# Patient Record
Sex: Male | Born: 1935 | Race: White | Hispanic: No | Marital: Married | Smoking: Former smoker
Health system: Southern US, Community
[De-identification: ages and names within clinical notes are randomized; demographics above are authoritative.]

## PROBLEM LIST (undated history)

## (undated) DIAGNOSIS — Z85828 Personal history of other malignant neoplasm of skin: Secondary | ICD-10-CM

## (undated) DIAGNOSIS — M25562 Pain in left knee: Secondary | ICD-10-CM

## (undated) DIAGNOSIS — G459 Transient cerebral ischemic attack, unspecified: Secondary | ICD-10-CM

## (undated) DIAGNOSIS — M792 Neuralgia and neuritis, unspecified: Secondary | ICD-10-CM

## (undated) DIAGNOSIS — N189 Chronic kidney disease, unspecified: Secondary | ICD-10-CM

## (undated) DIAGNOSIS — N400 Enlarged prostate without lower urinary tract symptoms: Secondary | ICD-10-CM

## (undated) DIAGNOSIS — R1310 Dysphagia, unspecified: Secondary | ICD-10-CM

## (undated) DIAGNOSIS — I609 Nontraumatic subarachnoid hemorrhage, unspecified: Secondary | ICD-10-CM

## (undated) DIAGNOSIS — M542 Cervicalgia: Secondary | ICD-10-CM

## (undated) DIAGNOSIS — I10 Essential (primary) hypertension: Secondary | ICD-10-CM

## (undated) DIAGNOSIS — E785 Hyperlipidemia, unspecified: Secondary | ICD-10-CM

## (undated) DIAGNOSIS — Z86010 Personal history of colonic polyps: Secondary | ICD-10-CM

## (undated) DIAGNOSIS — I739 Peripheral vascular disease, unspecified: Secondary | ICD-10-CM

## (undated) DIAGNOSIS — S40012A Contusion of left shoulder, initial encounter: Secondary | ICD-10-CM

## (undated) DIAGNOSIS — I639 Cerebral infarction, unspecified: Secondary | ICD-10-CM

## (undated) DIAGNOSIS — E119 Type 2 diabetes mellitus without complications: Secondary | ICD-10-CM

## (undated) HISTORY — DX: Peripheral vascular disease, unspecified: I73.9

## (undated) HISTORY — DX: Chronic kidney disease, unspecified: N18.9

## (undated) HISTORY — DX: Benign prostatic hyperplasia without lower urinary tract symptoms: N40.0

## (undated) HISTORY — DX: Contusion of left shoulder, initial encounter: S40.012A

## (undated) HISTORY — DX: Nontraumatic subarachnoid hemorrhage, unspecified: I60.9

## (undated) HISTORY — DX: Dysphagia, unspecified: R13.10

## (undated) HISTORY — DX: Hyperlipidemia, unspecified: E78.5

## (undated) HISTORY — DX: Personal history of other malignant neoplasm of skin: Z85.828

## (undated) HISTORY — DX: Transient cerebral ischemic attack, unspecified: G45.9

## (undated) HISTORY — DX: Essential (primary) hypertension: I10

## (undated) HISTORY — DX: Type 2 diabetes mellitus without complications: E11.9

## (undated) HISTORY — DX: Personal history of colonic polyps: Z86.010

## (undated) HISTORY — DX: Cerebral infarction, unspecified: I63.9

## (undated) HISTORY — DX: Pain in left knee: M25.562

## (undated) HISTORY — PX: TONSILLECTOMY: SUR1361

## (undated) HISTORY — DX: Cervicalgia: M54.2

---
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
## 1990-06-26 HISTORY — PX: COLONOSCOPY: SHX174

## 1996-06-26 HISTORY — PX: OTHER SURGICAL HISTORY: SHX169

## 1996-06-26 HISTORY — PX: COLONOSCOPY: SHX174

## 2003-10-14 DIAGNOSIS — Z860100 Personal history of colon polyps, unspecified: Secondary | ICD-10-CM

## 2003-10-14 HISTORY — DX: Personal history of colonic polyps: Z86.010

## 2003-10-14 HISTORY — DX: Personal history of colon polyps, unspecified: Z86.0100

## 2005-11-06 DIAGNOSIS — I6389 Other cerebral infarction: Secondary | ICD-10-CM

## 2005-11-06 DIAGNOSIS — G459 Transient cerebral ischemic attack, unspecified: Secondary | ICD-10-CM

## 2005-11-06 HISTORY — DX: Transient cerebral ischemic attack, unspecified: G45.9

## 2005-11-21 DIAGNOSIS — Z85828 Personal history of other malignant neoplasm of skin: Secondary | ICD-10-CM

## 2005-11-21 HISTORY — DX: Personal history of other malignant neoplasm of skin: Z85.828

## 2007-06-27 DIAGNOSIS — I609 Nontraumatic subarachnoid hemorrhage, unspecified: Secondary | ICD-10-CM

## 2007-06-27 HISTORY — DX: Nontraumatic subarachnoid hemorrhage, unspecified: I60.9

## 2014-05-27 DIAGNOSIS — M25562 Pain in left knee: Secondary | ICD-10-CM | POA: Insufficient documentation

## 2014-05-27 DIAGNOSIS — S40012A Contusion of left shoulder, initial encounter: Secondary | ICD-10-CM

## 2014-05-27 HISTORY — DX: Contusion of left shoulder, initial encounter: S40.012A

## 2014-05-27 HISTORY — DX: Pain in left knee: M25.562

## 2014-07-10 DIAGNOSIS — M79671 Pain in right foot: Secondary | ICD-10-CM | POA: Diagnosis not present

## 2014-07-10 DIAGNOSIS — B351 Tinea unguium: Secondary | ICD-10-CM | POA: Diagnosis not present

## 2014-07-10 DIAGNOSIS — L84 Corns and callosities: Secondary | ICD-10-CM | POA: Diagnosis not present

## 2014-07-10 DIAGNOSIS — M79672 Pain in left foot: Secondary | ICD-10-CM | POA: Diagnosis not present

## 2014-09-03 ENCOUNTER — Encounter: Payer: Self-pay | Admitting: Internal Medicine

## 2014-09-08 ENCOUNTER — Encounter: Payer: Self-pay | Admitting: Internal Medicine

## 2014-09-08 ENCOUNTER — Non-Acute Institutional Stay: Payer: Medicare Other | Admitting: Internal Medicine

## 2014-09-08 VITALS — BP 118/56 | HR 76 | Temp 97.7°F | Ht 68.0 in | Wt 145.0 lb

## 2014-09-08 DIAGNOSIS — E119 Type 2 diabetes mellitus without complications: Secondary | ICD-10-CM | POA: Diagnosis not present

## 2014-09-08 DIAGNOSIS — E785 Hyperlipidemia, unspecified: Secondary | ICD-10-CM

## 2014-09-08 DIAGNOSIS — S40012D Contusion of left shoulder, subsequent encounter: Secondary | ICD-10-CM

## 2014-09-08 DIAGNOSIS — M25562 Pain in left knee: Secondary | ICD-10-CM | POA: Diagnosis not present

## 2014-09-08 DIAGNOSIS — I10 Essential (primary) hypertension: Secondary | ICD-10-CM

## 2014-09-14 ENCOUNTER — Encounter: Payer: Self-pay | Admitting: Internal Medicine

## 2014-09-14 DIAGNOSIS — E785 Hyperlipidemia, unspecified: Secondary | ICD-10-CM | POA: Insufficient documentation

## 2014-09-14 DIAGNOSIS — E1122 Type 2 diabetes mellitus with diabetic chronic kidney disease: Secondary | ICD-10-CM | POA: Insufficient documentation

## 2014-09-14 DIAGNOSIS — N183 Chronic kidney disease, stage 3 unspecified: Secondary | ICD-10-CM | POA: Insufficient documentation

## 2014-09-14 DIAGNOSIS — I129 Hypertensive chronic kidney disease with stage 1 through stage 4 chronic kidney disease, or unspecified chronic kidney disease: Secondary | ICD-10-CM | POA: Insufficient documentation

## 2014-09-14 NOTE — Progress Notes (Deleted)
Patient ID: Nathan Cook, male   DOB: Oct 06, 1921, 81 y.o.   MRN: [id number]    HISTORY AND PHYSICAL  Location:  [place] of Service: Clinic (12)   Extended Emergency Contact Information Primary Emergency Contact: [place] Address: [address]          [place], [place] [address] [place] Phone: [phone] Relation: Daughter  Advanced Directive information Does patient have an advance directive?: Yes, Type of Advance Directive: Healthcare Power of [place];Living will;Out of facility DNR (pink MOST or yellow form)  Chief Complaint  Patient presents with  . Medical Management of Chronic Issues    New patient, moved to [facility] 12/15. Checks blood sugar about every other day, this morning was 111, usually in 105 - 136 range  . Fall    in Dec 2015, hit left knee and shoulder. Left knee and upper lower leg has some swelling, still.     HPI:  Hyperlipidemia: controlled on simvastatin  Essential hypertension: controlled on olmesartan  Type 2 diabetes mellitus without complication: controlled on diet and metformin  Left knee pain: Had a fall in Dec 2015. Still has some swelling of the right knee where he had a hematoma. Minimal discomfort. Does not interfere with walking.  Contusion of left shoulder, subsequent encounter: had a fall in Dec 2015. Initially uncomfortable, but this has resolved. FROM at the shoulder.    Past Medical History  Diagnosis Date  . Hyperlipidemia   . High blood pressure   . DM type 2 (diabetes mellitus, type 2)   . Left knee pain 05/27/14  . Contusion of left shoulder 05/27/14    Past Surgical History  Procedure Laterality Date  . Tonsillectomy    . Achilles tendon repair Left 1998    ruptured tendon  . Orif femur fracture Right 1929  . Colonoscopy  1992    Polyp removed  . Colonoscopy  1998    normal    Patient Care Team: Estill Dooms, MD as PCP - General (Internal Medicine)  History   Social  History  . Marital Status: Married    Spouse Name: N/A  . Number of Children: N/A  . Years of Education: N/A   Occupational History  . retired Hotel manager     Social History Main Topics  . Smoking status: Former Smoker    Quit date: 06/26/1971  . Smokeless tobacco: Never Used     Comment: 2 1/2 packs a day  . Alcohol Use: 0.6 - 1.2 oz/week    0 Standard drinks or equivalent, 1-2 Glasses of wine per week     Comment: Wine nightly  . Drug Use: No  . Sexual Activity: Not on file   Other Topics Concern  . Not on file   Social History Narrative   Patient lives at [facility] since Dec 2015   Caffeine- Coffee   Married- Yes, 1951   [employer] [employer] 1943-46   House- Apartment with 2 people   Pets- No   Current/past profession- Sales   Exercise- Yes, walking   Living will- Yes   DNR- Yes   POA/HPOA- Yes           reports that he quit smoking about 43 years ago. He has never used smokeless tobacco. He reports that he drinks about 0.6 - 1.2 oz of alcohol per week. He reports that he does not use illicit drugs.  Family History  Problem Relation Age of Onset  .  Heart attack Mother   . Heart disease Mother   . Heart disease Father    Family Status  Relation Status Death Age  . Mother Deceased 29    myocardial infarction  . Father Deceased 77    Coronary artery disease  . Brother Alive   . Son Alive   . Daughter Alive   . Daughter Alive   . Son Alive     Immunization History  Administered Date(s) Administered  . DTaP 12/31/2012  . Influenza-Unspecified 04/10/2014  . PPD Test 04/29/2014  . Pneumococcal-Unspecified 03/13/2011    Not on File  Medications: Patient's Medications  New Prescriptions   No medications on file  Previous Medications   CHOLECALCIFEROL 1000 UNITS TABLET    Take 1,000 Units by mouth daily.   METFORMIN (GLUCOPHAGE) 500 MG TABLET    Take 500 mg by mouth daily with breakfast.   OLMESARTAN (BENICAR) 20 MG TABLET    Take 20 mg by mouth daily.    SIMVASTATIN (ZOCOR) 20 MG TABLET    Take 20 mg by mouth daily.   VITAMIN B-12 (CYANOCOBALAMIN) 1000 MCG TABLET    Take 1,000 mcg by mouth daily.  Modified Medications   No medications on file  Discontinued Medications   PSEUDOEPHEDRINE-ACETAMINOPHEN (TYLENOL SINUS) 30-500 MG TABS    Take 1 tablet by mouth every other day.    Review of Systems  Constitutional: Negative for fever, activity change, appetite change, fatigue and unexpected weight change (10# loss in the last 18 months).  HENT: Positive for tinnitus. Negative for congestion, ear pain, hearing loss, rhinorrhea, sore throat, trouble swallowing and voice change.   Eyes:       Corrective lenses  Respiratory: Negative for cough, choking, chest tightness, shortness of breath and wheezing.   Cardiovascular: Negative for chest pain, palpitations and leg swelling.  Gastrointestinal: Negative for nausea, abdominal pain, diarrhea, constipation and abdominal distention.  Endocrine: Negative for cold intolerance, heat intolerance, polydipsia, polyphagia and polyuria.  Genitourinary: Negative for dysuria, urgency, frequency and testicular pain.       Not incontinent  Musculoskeletal: Positive for back pain and neck pain. Negative for myalgias, arthralgias and gait problem.  Skin: Negative for color change, pallor and rash.  Allergic/Immunologic: Negative.   Neurological: Negative for dizziness, tremors, syncope, speech difficulty, weakness, numbness and headaches.  Hematological: Negative for adenopathy. Bruises/bleeds easily.  Psychiatric/Behavioral: Negative for hallucinations, behavioral problems, confusion, sleep disturbance and decreased concentration. The patient is not nervous/anxious.     Filed Vitals:   09/08/14 1034  BP: 118/56  Pulse: 76  Temp: 97.7 F (36.5 C)  TempSrc: Oral  Height: 5\' 8"  (1.727 m)  Weight: 145 lb (65.772 kg)  SpO2: 96%   Body mass index is 22.05 kg/(m^2).  Physical Exam  Constitutional: He is  oriented to person, place, and time. He appears well-developed and well-nourished. No distress.  HENT:  Right Ear: External ear normal.  Left Ear: External ear normal.  Nose: Nose normal.  Mouth/Throat: Oropharynx is clear and moist. No oropharyngeal exudate.  Eyes: Conjunctivae and EOM are normal. Pupils are equal, round, and reactive to light.  Neck: No JVD present. No tracheal deviation present. No thyromegaly present.  Cardiovascular: Normal rate, regular rhythm, normal heart sounds and intact distal pulses.  Exam reveals no gallop and no friction rub.   No murmur heard. Pulmonary/Chest: No respiratory distress. He has no wheezes. He has no rales. He exhibits no tenderness.  Abdominal: He exhibits no distension and no mass.  There is no tenderness.  Musculoskeletal: Normal range of motion. He exhibits no edema or tenderness.  Lymphadenopathy:    He has no cervical adenopathy.  Neurological: He is alert and oriented to person, place, and time. He has normal reflexes. No cranial nerve deficit. Coordination normal.  10/29/1254 MMSE 29/30. Passed clock drawing.  Skin: No rash noted. No erythema. No pallor.  Psychiatric: He has a normal mood and affect. His behavior is normal. Judgment and thought content normal.     Labs reviewed: No labs available  Assessment/Plan   1. Hyperlipidemia - lipids, future  2. Essential hypertension - CMP, future  3. Type 2 diabetes mellitus without complication -123456, CMP-future  4. Left knee pain Mild and will likely improve further  5. Contusion of left shoulder, subsequent encounter resolved

## 2014-09-14 NOTE — Progress Notes (Signed)
Patient ID: Nathan Cook, male   DOB: [birth year], 81 y.o.   MRN: [id number]    HISTORY AND PHYSICAL  Location:  [place] of Service: Clinic (12)   Extended Emergency Contact Information Primary Emergency Contact: [place] Address: [address]          [place], [place] [address] [place] Phone: [phone] Relation: Daughter  Advanced Directive information Does patient have an advance directive?: Yes, Type of Advance Directive: Healthcare Power of [place];Living will;Out of facility DNR (pink MOST or yellow form)  Chief Complaint  Patient presents with  . Medical Management of Chronic Issues    New patient, moved to [facility] 12/15. Checks blood sugar about every other day, this morning was 111, usually in 105 - 136 range  . Fall    in Dec 2015, hit left knee and shoulder. Left knee and upper lower leg has some swelling, still.     HPI:  .Hyperlipidemia: controlled on simvastatin  Essential hypertension: controlled on olmesartan  Type 2 diabetes mellitus without complication: controlled on diet and metformin  Left knee pain: had a fall in Dec 2015. Still has some swelling of the right knee where he had a hematoma. Minimal discomfort. Does not interfere with walking.  Contusion of left shoulder, subsequent encounter: had a fall in Dec 2015. Initially uncomfortable, but this has resolved. FROM at the shoulder.    Past Medical History  Diagnosis Date  . Hyperlipidemia   . High blood pressure   . DM type 2 (diabetes mellitus, type 2)   . Left knee pain 05/27/14  . Contusion of left shoulder 05/27/14    Past Surgical History  Procedure Laterality Date  . Tonsillectomy    . Achilles tendon repair Left 1998    ruptured tendon  . Orif femur fracture Right 1929  . Colonoscopy  1992    Polyp removed  . Colonoscopy  1998    normal    Patient Care Team: Estill Dooms, MD as PCP - General (Internal Medicine)  History    Social History  . Marital Status: Married    Spouse Name: N/A  . Number of Children: N/A  . Years of Education: N/A   Occupational History  . retired Hotel manager     Social History Main Topics  . Smoking status: Former Smoker    Quit date: 06/26/1971  . Smokeless tobacco: Never Used     Comment: 2 1/2 packs a day  . Alcohol Use: 0.6 - 1.2 oz/week    0 Standard drinks or equivalent, 1-2 Glasses of wine per week     Comment: Wine nightly  . Drug Use: No  . Sexual Activity: Not on file   Other Topics Concern  . Not on file   Social History Narrative   Patient lives at [facility] since Dec 2015   Caffeine- Coffee   Married- Yes, 1951   [employer] [employer] 1943-46   House- Apartment with 2 people   Pets- No   Current/past profession- Sales   Exercise- Yes, walking   Living will- Yes   DNR- Yes   POA/HPOA- Yes           reports that he quit smoking about 43 years ago. He has never used smokeless tobacco. He reports that he drinks about 0.6 - 1.2 oz of alcohol per week. He reports that he does not use illicit drugs.  Family History  Problem Relation Age of Onset  .  Heart attack Mother   . Heart disease Mother   . Heart disease Father    Family Status  Relation Status Death Age  . Mother Deceased 40    myocardial infarction  . Father Deceased 66    Coronary artery disease  . Brother Alive   . Son Alive   . Daughter Alive   . Daughter Alive   . Son Alive     Immunization History  Administered Date(s) Administered  . DTaP 12/31/2012  . Influenza-Unspecified 04/10/2014  . PPD Test 04/29/2014  . Pneumococcal-Unspecified 03/13/2011    Not on File  Medications: Patient's Medications  New Prescriptions   No medications on file  Previous Medications   CHOLECALCIFEROL 1000 UNITS TABLET    Take 1,000 Units by mouth daily.   METFORMIN (GLUCOPHAGE) 500 MG TABLET    Take 500 mg by mouth daily with breakfast.   OLMESARTAN (BENICAR) 20 MG TABLET    Take 20 mg by  mouth daily.   SIMVASTATIN (ZOCOR) 20 MG TABLET    Take 20 mg by mouth daily.   VITAMIN B-12 (CYANOCOBALAMIN) 1000 MCG TABLET    Take 1,000 mcg by mouth daily.  Modified Medications   No medications on file  Discontinued Medications   PSEUDOEPHEDRINE-ACETAMINOPHEN (TYLENOL SINUS) 30-500 MG TABS    Take 1 tablet by mouth every other day.    Review of Systems  Constitutional: Negative for fever, activity change, appetite change, fatigue and unexpected weight change.       10# loss in the last 18 mo  HENT: Negative for congestion, ear pain, hearing loss, rhinorrhea, sore throat, tinnitus, trouble swallowing and voice change.   Eyes:       Corrective lenses  Respiratory: Negative for cough, choking, chest tightness, shortness of breath and wheezing.   Cardiovascular: Negative for chest pain, palpitations and leg swelling.  Gastrointestinal: Negative for nausea, abdominal pain, diarrhea, constipation and abdominal distention.  Endocrine: Negative for cold intolerance, heat intolerance, polydipsia, polyphagia and polyuria.  Genitourinary: Negative for dysuria, urgency, frequency and testicular pain.       Not incontinent  Musculoskeletal: Positive for back pain and neck pain. Negative for myalgias, arthralgias and gait problem.  Skin: Negative for color change, pallor and rash.  Allergic/Immunologic: Negative.   Neurological: Negative for dizziness, tremors, syncope, speech difficulty, weakness, numbness and headaches.  Hematological: Negative for adenopathy. Bruises/bleeds easily.  Psychiatric/Behavioral: Negative for hallucinations, behavioral problems, confusion, sleep disturbance and decreased concentration. The patient is not nervous/anxious.     Filed Vitals:   09/08/14 1034  BP: 118/56  Pulse: 76  Temp: 97.7 F (36.5 C)  TempSrc: Oral  Height: 5\' 8"  (1.727 m)  Weight: 145 lb (65.772 kg)  SpO2: 96%   Body mass index is 22.05 kg/(m^2).  Physical Exam  Constitutional: He is  oriented to person, place, and time. He appears well-developed and well-nourished. No distress.  HENT:  Right Ear: External ear normal.  Left Ear: External ear normal.  Nose: Nose normal.  Mouth/Throat: Oropharynx is clear and moist. No oropharyngeal exudate.  Eyes: Conjunctivae and EOM are normal. Pupils are equal, round, and reactive to light.  Neck: No JVD present. No tracheal deviation present. No thyromegaly present.  Cardiovascular: Normal rate, regular rhythm, normal heart sounds and intact distal pulses.  Exam reveals no gallop and no friction rub.   No murmur heard. Pulmonary/Chest: No respiratory distress. He has no wheezes. He has no rales. He exhibits no tenderness.  Abdominal: He exhibits no  distension and no mass. There is no tenderness.  Musculoskeletal: Normal range of motion. He exhibits no edema or tenderness.  Lymphadenopathy:    He has no cervical adenopathy.  Neurological: He is alert and oriented to person, place, and time. He has normal reflexes. No cranial nerve deficit. Coordination normal.  Skin: No rash noted. No erythema. No pallor.  Psychiatric: He has a normal mood and affect. His behavior is normal. Judgment and thought content normal.     Labs reviewed:  No results available    Assessment/Plan 1. Hyperlipidemia - lipids: future  2. Essential hypertension -CMP: future  3. Type 2 diabetes mellitus without complication -123456, CMP: future  4. Left knee pain improved  5. Contusion of left shoulder, subsequent encounter resolved

## 2014-10-14 ENCOUNTER — Encounter: Payer: Self-pay | Admitting: Internal Medicine

## 2014-10-14 DIAGNOSIS — N400 Enlarged prostate without lower urinary tract symptoms: Secondary | ICD-10-CM

## 2014-10-14 HISTORY — DX: Benign prostatic hyperplasia without lower urinary tract symptoms: N40.0

## 2014-12-15 DIAGNOSIS — D225 Melanocytic nevi of trunk: Secondary | ICD-10-CM | POA: Diagnosis not present

## 2014-12-15 DIAGNOSIS — X32XXXA Exposure to sunlight, initial encounter: Secondary | ICD-10-CM | POA: Diagnosis not present

## 2014-12-15 DIAGNOSIS — L570 Actinic keratosis: Secondary | ICD-10-CM | POA: Diagnosis not present

## 2014-12-15 DIAGNOSIS — L219 Seborrheic dermatitis, unspecified: Secondary | ICD-10-CM | POA: Diagnosis not present

## 2015-01-04 DIAGNOSIS — E119 Type 2 diabetes mellitus without complications: Secondary | ICD-10-CM | POA: Diagnosis not present

## 2015-01-04 LAB — HEPATIC FUNCTION PANEL
ALK PHOS: 56 U/L (ref 25–125)
ALT: 15 U/L (ref 10–40)
AST: 17 U/L (ref 14–40)
Bilirubin, Total: 0.8 mg/dL

## 2015-01-04 LAB — BASIC METABOLIC PANEL
BUN: 20 mg/dL (ref 4–21)
CREATININE: 1.2 mg/dL (ref 0.6–1.3)
Glucose: 143 mg/dL
Potassium: 4.4 mmol/L (ref 3.4–5.3)
SODIUM: 135 mmol/L — AB (ref 137–147)

## 2015-01-04 LAB — HEMOGLOBIN A1C: HEMOGLOBIN A1C: 7.6 % — AB (ref 4.0–6.0)

## 2015-01-12 ENCOUNTER — Encounter: Payer: Self-pay | Admitting: Internal Medicine

## 2015-01-12 ENCOUNTER — Non-Acute Institutional Stay: Payer: Medicare Other | Admitting: Internal Medicine

## 2015-01-12 VITALS — BP 122/60 | HR 76 | Temp 97.2°F | Wt 153.0 lb

## 2015-01-12 DIAGNOSIS — E785 Hyperlipidemia, unspecified: Secondary | ICD-10-CM

## 2015-01-12 DIAGNOSIS — E119 Type 2 diabetes mellitus without complications: Secondary | ICD-10-CM | POA: Diagnosis not present

## 2015-01-12 DIAGNOSIS — I10 Essential (primary) hypertension: Secondary | ICD-10-CM

## 2015-01-12 MED ORDER — METFORMIN HCL 1000 MG PO TABS: ORAL_TABLET | ORAL | Status: DC

## 2015-01-12 NOTE — Progress Notes (Signed)
Patient ID: Nathan Cook, male   DOB: 05-29-1922, 81 y.o.   MRN: [id number]    [place]: Clinic (12)     Not on File  Chief Complaint  Patient presents with  . Medical Management of Chronic Issues    blood pressure, blood sugar.  Blood sugar 01/11/15  118, usually in that range    HPI:  Type 2 diabetes mellitus without complication - control is not as good as previously. A1c has risen. Fasting glucose 146 mg percent. Patient regained 8 pounds since his last visit.  Essential hypertension: Controlled  Hyperlipidemia: Controlled with simvastatin  Medications: Patient's Medications  New Prescriptions   No medications on file  Previous Medications   ASPIRIN 325 MG EC TABLET    Take 325 mg by mouth. Take one every other day   CHOLECALCIFEROL 1000 UNITS TABLET    Take 1,000 Units by mouth daily.   METFORMIN (GLUCOPHAGE) 500 MG TABLET    Take 500 mg by mouth daily with breakfast.   OLMESARTAN (BENICAR) 20 MG TABLET    Take 20 mg by mouth daily.   SIMVASTATIN (ZOCOR) 20 MG TABLET    Take 20 mg by mouth daily.   VITAMIN B-12 (CYANOCOBALAMIN) 1000 MCG TABLET    Take 1,000 mcg by mouth daily.  Modified Medications   No medications on file  Discontinued Medications   No medications on file     Review of Systems  Constitutional: Negative for fever, activity change, appetite change, fatigue and unexpected weight change.       Regained 8 pounds since his last visit  HENT: Negative for congestion, ear pain, hearing loss, rhinorrhea, sore throat, tinnitus, trouble swallowing and voice change.   Eyes:       Corrective lenses  Respiratory: Negative for cough, choking, chest tightness, shortness of breath and wheezing.   Cardiovascular: Negative for chest pain, palpitations and leg swelling.  Gastrointestinal: Negative for nausea, abdominal pain, diarrhea, constipation and abdominal distention.  Endocrine: Negative for cold intolerance, heat  intolerance, polydipsia, polyphagia and polyuria.  Genitourinary: Negative for dysuria, urgency, frequency and testicular pain.       Not incontinent  Musculoskeletal: Positive for back pain and neck pain. Negative for myalgias, arthralgias and gait problem.  Skin: Negative for color change, pallor and rash.  Allergic/Immunologic: Negative.   Neurological: Negative for dizziness, tremors, syncope, speech difficulty, weakness, numbness and headaches.  Hematological: Negative for adenopathy. Bruises/bleeds easily.  Psychiatric/Behavioral: Negative for hallucinations, behavioral problems, confusion, sleep disturbance and decreased concentration. The patient is not nervous/anxious.     Filed Vitals:   01/12/15 0953  BP: 122/60  Pulse: 76  Temp: 97.2 F (36.2 C)  TempSrc: Oral  Weight: 153 lb (69.4 kg)  SpO2: 93%   Body mass index is 23.27 kg/(m^2).  Physical Exam  Constitutional: He is oriented to person, place, and time. He appears well-developed and well-nourished. No distress.  HENT:  Right Ear: External ear normal.  Left Ear: External ear normal.  Nose: Nose normal.  Mouth/Throat: Oropharynx is clear and moist. No oropharyngeal exudate.  Eyes: Conjunctivae and EOM are normal. Pupils are equal, round, and reactive to light.  Neck: No JVD present. No tracheal deviation present. No thyromegaly present.  Cardiovascular: Normal rate, regular rhythm, normal heart sounds and intact distal pulses.  Exam reveals no gallop and no friction rub.   No murmur heard. Pulmonary/Chest: No respiratory distress. He has no wheezes. He has no  rales. He exhibits no tenderness.  Abdominal: He exhibits no distension and no mass. There is no tenderness.  Musculoskeletal: Normal range of motion. He exhibits no edema or tenderness.  Lymphadenopathy:    He has no cervical adenopathy.  Neurological: He is alert and oriented to person, place, and time. He has normal reflexes. No cranial nerve deficit.  Coordination normal.  Skin: No rash noted. No erythema. No pallor.  Psychiatric: He has a normal mood and affect. His behavior is normal. Judgment and thought content normal.     Labs reviewed: Nursing Home on 01/12/2015  Component Date Value Ref Range Status  . Glucose 01/04/2015 143   Final  . BUN 01/04/2015 20  4 - 21 mg/dL Final  . Creatinine 01/04/2015 1.2  0.6 - 1.3 mg/dL Final  . Potassium 01/04/2015 4.4  3.4 - 5.3 mmol/L Final  . Sodium 01/04/2015 135* 137 - 147 mmol/L Final  . Alkaline Phosphatase 01/04/2015 56  25 - 125 U/L Final  . ALT 01/04/2015 15  10 - 40 U/L Final  . AST 01/04/2015 17  14 - 40 U/L Final  . Bilirubin, Total 01/04/2015 0.8   Final  . Hgb A1c MFr Bld 01/04/2015 7.6* 4.0 - 6.0 % Final     Assessment/Plan  1. Type 2 diabetes mellitus without complication -123456, BMP, future - metFORMIN (GLUCOPHAGE) 1000 MG tablet; One each morning to control diabetes  Dispense: 90 tablet; Refill: 5  2. Essential hypertension -BMP, future  3. Hyperlipidemia -Lipid panel, future

## 2015-01-19 ENCOUNTER — Telehealth: Payer: Self-pay | Admitting: *Deleted

## 2015-01-19 NOTE — Telephone Encounter (Signed)
Patient called and stated that he needs a letter for his long term insurance stating that he is capable of making his own personal/financial decisions.

## 2015-01-19 NOTE — Telephone Encounter (Signed)
Patient called and left message on voicemail to return his call regarding some paperwork he needs Janett Billow to fill out. I tried calling patient yesterday and it just had a fast busy signal and i tried today and it just rings and rings. Will try again later.

## 2015-01-20 ENCOUNTER — Encounter: Payer: Self-pay | Admitting: Internal Medicine

## 2015-01-20 NOTE — Telephone Encounter (Signed)
Patient notified and letter mailed to him

## 2015-01-20 NOTE — Telephone Encounter (Signed)
Completed 01/20/2015.

## 2015-01-25 ENCOUNTER — Encounter: Payer: Self-pay | Admitting: Internal Medicine

## 2015-02-05 DIAGNOSIS — M79672 Pain in left foot: Secondary | ICD-10-CM | POA: Diagnosis not present

## 2015-02-05 DIAGNOSIS — M79671 Pain in right foot: Secondary | ICD-10-CM | POA: Diagnosis not present

## 2015-02-05 DIAGNOSIS — L84 Corns and callosities: Secondary | ICD-10-CM | POA: Diagnosis not present

## 2015-02-05 DIAGNOSIS — B351 Tinea unguium: Secondary | ICD-10-CM | POA: Diagnosis not present

## 2015-02-18 DIAGNOSIS — H5203 Hypermetropia, bilateral: Secondary | ICD-10-CM | POA: Diagnosis not present

## 2015-02-18 DIAGNOSIS — E119 Type 2 diabetes mellitus without complications: Secondary | ICD-10-CM | POA: Diagnosis not present

## 2015-02-19 ENCOUNTER — Other Ambulatory Visit: Payer: Self-pay

## 2015-02-19 MED ORDER — OLMESARTAN MEDOXOMIL 20 MG PO TABS: 20.0000 mg | ORAL_TABLET | Freq: Every day | ORAL | Status: DC

## 2015-02-19 NOTE — Telephone Encounter (Signed)
Request from Kristopher Oppenheim by fax

## 2015-04-09 DIAGNOSIS — B351 Tinea unguium: Secondary | ICD-10-CM | POA: Diagnosis not present

## 2015-04-09 DIAGNOSIS — L84 Corns and callosities: Secondary | ICD-10-CM | POA: Diagnosis not present

## 2015-04-09 DIAGNOSIS — M79672 Pain in left foot: Secondary | ICD-10-CM | POA: Diagnosis not present

## 2015-04-09 DIAGNOSIS — M79671 Pain in right foot: Secondary | ICD-10-CM | POA: Diagnosis not present

## 2015-05-03 ENCOUNTER — Encounter: Payer: Self-pay | Admitting: *Deleted

## 2015-05-03 DIAGNOSIS — E119 Type 2 diabetes mellitus without complications: Secondary | ICD-10-CM | POA: Diagnosis not present

## 2015-05-03 DIAGNOSIS — E785 Hyperlipidemia, unspecified: Secondary | ICD-10-CM | POA: Diagnosis not present

## 2015-05-03 LAB — BASIC METABOLIC PANEL
BUN: 18 mg/dL (ref 4–21)
Creatinine: 1.2 mg/dL (ref 0.6–1.3)
Glucose: 137 mg/dL
Potassium: 4.7 mmol/L (ref 3.4–5.3)
SODIUM: 137 mmol/L (ref 137–147)

## 2015-05-03 LAB — LIPID PANEL
CHOLESTEROL: 183 mg/dL (ref 0–200)
HDL: 47 mg/dL (ref 35–70)
LDL CALC: 114 mg/dL
TRIGLYCERIDES: 110 mg/dL (ref 40–160)

## 2015-05-03 LAB — HEMOGLOBIN A1C: HEMOGLOBIN A1C: 7.4 % — AB (ref 4.0–6.0)

## 2015-05-11 ENCOUNTER — Non-Acute Institutional Stay: Payer: Medicare Other | Admitting: Internal Medicine

## 2015-05-11 ENCOUNTER — Encounter: Payer: Self-pay | Admitting: Internal Medicine

## 2015-05-11 VITALS — BP 124/62 | HR 78 | Temp 97.5°F | Resp 20 | Wt 154.8 lb

## 2015-05-11 DIAGNOSIS — M25519 Pain in unspecified shoulder: Secondary | ICD-10-CM | POA: Insufficient documentation

## 2015-05-11 DIAGNOSIS — M25562 Pain in left knee: Secondary | ICD-10-CM | POA: Diagnosis not present

## 2015-05-11 DIAGNOSIS — M25512 Pain in left shoulder: Secondary | ICD-10-CM

## 2015-05-11 DIAGNOSIS — N400 Enlarged prostate without lower urinary tract symptoms: Secondary | ICD-10-CM

## 2015-05-11 DIAGNOSIS — M25511 Pain in right shoulder: Secondary | ICD-10-CM

## 2015-05-11 DIAGNOSIS — I10 Essential (primary) hypertension: Secondary | ICD-10-CM | POA: Diagnosis not present

## 2015-05-11 DIAGNOSIS — E785 Hyperlipidemia, unspecified: Secondary | ICD-10-CM

## 2015-05-11 DIAGNOSIS — E119 Type 2 diabetes mellitus without complications: Secondary | ICD-10-CM | POA: Diagnosis not present

## 2015-05-11 NOTE — Progress Notes (Signed)
Patient ID: Nathan Cook, male   DOB: 07/02/1921, 81 y.o.   MRN: [id number]    [place] of Service: Clinic (12)     No Known Allergies  Chief Complaint  Patient presents with  . Medical Management of Chronic Issues    f/u -BP BS Labs    HPI:  Essential hypertension - controlled  Type 2 diabetes mellitus without complication, without long-term current use of insulin (HCC) - blood sugars running a little higher, particularly in the mornings area he does check his sugars periodically at home. He realizes that he has been loose with his diet control. He is not exercising as much. He believes he can do better.  Hyperlipidemia adequately controlled -   Left knee pain - improved   Pain of both shoulder joints - mild discomfort bilaterally.   BPH (benign prostatic hyperplasia) - Asymptomatic     Medications: Patient's Medications  New Prescriptions   No medications on file  Previous Medications   ASPIRIN 325 MG EC TABLET    Take 325 mg by mouth. Take one every other day   CHOLECALCIFEROL 1000 UNITS TABLET    Take 1,000 Units by mouth daily.   METFORMIN (GLUCOPHAGE) 1000 MG TABLET    One each morning to control diabetes   OLMESARTAN (BENICAR) 20 MG TABLET    Take 1 tablet (20 mg total) by mouth daily.   SIMVASTATIN (ZOCOR) 20 MG TABLET    Take 20 mg by mouth daily.   VITAMIN B-12 (CYANOCOBALAMIN) 1000 MCG TABLET    Take 1,000 mcg by mouth daily.  Modified Medications   No medications on file  Discontinued Medications   No medications on file     Review of Systems  Constitutional: Negative for fever, activity change, appetite change, fatigue and unexpected weight change.       Regained 8 pounds since his last visit  HENT: Negative for congestion, ear pain, hearing loss, rhinorrhea, sore throat, tinnitus, trouble swallowing and voice change.   Eyes:       Corrective lenses  Respiratory: Negative for cough, choking, chest tightness, shortness  of breath and wheezing.   Cardiovascular: Negative for chest pain, palpitations and leg swelling.  Gastrointestinal: Negative for nausea, abdominal pain, diarrhea, constipation and abdominal distention.  Endocrine: Negative for cold intolerance, heat intolerance, polydipsia, polyphagia and polyuria.  Genitourinary: Negative for dysuria, urgency, frequency and testicular pain.       Not incontinent  Musculoskeletal: Positive for back pain and neck pain. Negative for myalgias, arthralgias and gait problem.       Bilateral shoulder discomfort and left knee discomfort.  Skin: Negative for color change, pallor and rash.  Allergic/Immunologic: Negative.   Neurological: Negative for dizziness, tremors, syncope, speech difficulty, weakness, numbness and headaches.  Hematological: Negative for adenopathy. Bruises/bleeds easily.  Psychiatric/Behavioral: Negative for hallucinations, behavioral problems, confusion, sleep disturbance and decreased concentration. The patient is not nervous/anxious.     Filed Vitals:   05/11/15 0907  BP: 124/62  Pulse: 78  Temp: 97.5 F (36.4 C)  TempSrc: Oral  Resp: 20  Weight: 154 lb 12.8 oz (70.217 kg)  SpO2: 20%   Body mass index is 23.54 kg/(m^2).  Physical Exam  Constitutional: He is oriented to person, place, and time. He appears well-developed and well-nourished. No distress.  HENT:  Right Ear: External ear normal.  Left Ear: External ear normal.  Nose: Nose normal.  Mouth/Throat: Oropharynx is clear and moist. No oropharyngeal exudate.  Eyes: Conjunctivae and EOM are normal. Pupils are equal, round, and reactive to light.  Neck: No JVD present. No tracheal deviation present. No thyromegaly present.  Cardiovascular: Normal rate, regular rhythm, normal heart sounds and intact distal pulses.  Exam reveals no gallop and no friction rub.   No murmur heard. Pulmonary/Chest: No respiratory distress. He has no wheezes. He has no rales. He exhibits no  tenderness.  Abdominal: He exhibits no distension and no mass. There is no tenderness.  Musculoskeletal: Normal range of motion. He exhibits no edema or tenderness.  Bilateral shoulder discomfort. Mild swelling at left knee.  Lymphadenopathy:    He has no cervical adenopathy.  Neurological: He is alert and oriented to person, place, and time. He has normal reflexes. No cranial nerve deficit. Coordination normal.  Skin: No rash noted. No erythema. No pallor.  Psychiatric: He has a normal mood and affect. His behavior is normal. Judgment and thought content normal.     Labs reviewed: Lab Summary Latest Ref Rng 05/03/2015 01/04/2015  Hemoglobin - (None) (None)  Hematocrit - (None) (None)  White count - (None) (None)  Platelet count - (None) (None)  Sodium 137 - 147 mmol/L 137 135(A)  Potassium 3.4 - 5.3 mmol/L 4.7 4.4  Calcium - (None) (None)  Phosphorus - (None) (None)  Creatinine 0.6 - 1.3 mg/dL 1.2 1.2  AST 14 - 40 U/L (None) 17  Alk Phos 25 - 125 U/L (None) 56  Bilirubin - (None) (None)  Glucose - 137 143  Cholesterol 0 - 200 mg/dL 183 (None)  HDL cholesterol 35 - 70 mg/dL 47 (None)  Triglycerides 40 - 160 mg/dL 110 (None)  LDL Direct - (None) (None)  LDL Calc - 114 (None)  Total protein - (None) (None)  Albumin - (None) (None)   No results found for: TSH, T3TOTAL, T4TOTAL, THYROIDAB Lab Results  Component Value Date   BUN 18 05/03/2015   Lab Results  Component Value Date   HGBA1C 7.4* 05/03/2015       Assessment/Plan  1. Essential hypertension - Comprehensive metabolic panel; Future  2. Type 2 diabetes mellitus without complication, without long-term current use of insulin ([place]) Focus on increasing activities and exercises as well as better diet control. If A1c continues to rise, he will need an increase in his metformin or an alternative hypoglycemic agent. - Hemoglobin A1c; Future - Comprehensive metabolic panel; Future  3. Hyperlipidemia - Lipid panel;  Future  4. Left knee pain Improved  5. Pain of both shoulder joints Stable to improved  6. BPH (benign prostatic hyperplasia) Stable

## 2015-07-02 DIAGNOSIS — B351 Tinea unguium: Secondary | ICD-10-CM | POA: Diagnosis not present

## 2015-07-02 DIAGNOSIS — L84 Corns and callosities: Secondary | ICD-10-CM | POA: Diagnosis not present

## 2015-07-02 DIAGNOSIS — M79671 Pain in right foot: Secondary | ICD-10-CM | POA: Diagnosis not present

## 2015-07-02 DIAGNOSIS — M79672 Pain in left foot: Secondary | ICD-10-CM | POA: Diagnosis not present

## 2015-08-24 ENCOUNTER — Other Ambulatory Visit: Payer: Self-pay | Admitting: Internal Medicine

## 2015-09-02 DIAGNOSIS — E119 Type 2 diabetes mellitus without complications: Secondary | ICD-10-CM | POA: Diagnosis not present

## 2015-09-02 DIAGNOSIS — E785 Hyperlipidemia, unspecified: Secondary | ICD-10-CM | POA: Diagnosis not present

## 2015-09-02 LAB — LIPID PANEL
Cholesterol: 191 mg/dL (ref 0–200)
HDL: 48 mg/dL (ref 35–70)
LDL CALC: 121 mg/dL
Triglycerides: 109 mg/dL (ref 40–160)

## 2015-09-02 LAB — BASIC METABOLIC PANEL
BUN: 20 mg/dL (ref 4–21)
Creatinine: 1.2 mg/dL (ref 0.6–1.3)
GLUCOSE: 124 mg/dL
Potassium: 4.5 mmol/L (ref 3.4–5.3)
SODIUM: 137 mmol/L (ref 137–147)

## 2015-09-02 LAB — HEMOGLOBIN A1C: HEMOGLOBIN A1C: 7.3

## 2015-09-07 ENCOUNTER — Non-Acute Institutional Stay: Payer: Medicare Other | Admitting: Internal Medicine

## 2015-09-07 ENCOUNTER — Encounter: Payer: Self-pay | Admitting: Internal Medicine

## 2015-09-07 VITALS — BP 132/58 | HR 64 | Temp 97.5°F | Ht 68.0 in | Wt 155.0 lb

## 2015-09-07 DIAGNOSIS — R05 Cough: Secondary | ICD-10-CM | POA: Diagnosis not present

## 2015-09-07 DIAGNOSIS — E785 Hyperlipidemia, unspecified: Secondary | ICD-10-CM | POA: Diagnosis not present

## 2015-09-07 DIAGNOSIS — R059 Cough, unspecified: Secondary | ICD-10-CM | POA: Insufficient documentation

## 2015-09-07 DIAGNOSIS — E119 Type 2 diabetes mellitus without complications: Secondary | ICD-10-CM

## 2015-09-07 DIAGNOSIS — I10 Essential (primary) hypertension: Secondary | ICD-10-CM | POA: Diagnosis not present

## 2015-09-07 NOTE — Progress Notes (Signed)
Patient ID: Nathan Cook, male   DOB: January 24, 1922, 82 y.o.   MRN: [id number]   Location:  [facility] [phone])  Provider: Jeanmarie Hubert, M.D.  Code Status: DNR Goals of Care:  Advanced Directives 09/07/2015  Does patient have an advance directive? Yes  Type of Advance Directive Healthcare Power of Attorney  Copy of advanced directive(s) in chart? Yes     Chief Complaint  Patient presents with  . Medical Management of Chronic Issues    blood sugar, blood pressure, cholesterol. BS at home range around 98-137, checks about every other day around lunch  . Cough    when he eats    HPI: Patient is a 82 y.o. male seen today for medical management of chronic diseases.    Cough - afebrile. Persistent cough that is mainly a dry cough.  Type 2 diabetes mellitus without complication, without long-term current use of insulin (HCC) - controlled  Essential hypertension - controlled  Hyperlipidemia - controlled     Past Medical History  Diagnosis Date  . Hyperlipidemia   . High blood pressure   . DM type 2 (diabetes mellitus, type 2) ([place])   . Left knee pain 05/27/14  . Contusion of left shoulder 05/27/14  . TIA (transient ischemic attack) 11/06/2005    Left eye pain. Slurred speech. Diaphoresis. No residual effects.   Marland Kitchen BPH (benign prostatic hyperplasia) 10/14/2014  . History of colonic polyps 10/14/2003  . History of basal cell cancer 11/21/2005    Right temple    Past Surgical History  Procedure Laterality Date  . Tonsillectomy    . Achilles tendon repair Left 1998    ruptured tendon  . Orif femur fracture Right 1929  . Colonoscopy  1992    Polyp removed  . Colonoscopy  1998    normal    No Known Allergies    Medication List       This list is accurate as of: 09/07/15 10:22 AM.  Always use your most recent med list.               aspirin 325 MG EC tablet  Take 325 mg by mouth. Take one every other day     Cholecalciferol 1000  units tablet  Take 1,000 Units by mouth daily.     metFORMIN 1000 MG tablet  Commonly known as:  GLUCOPHAGE  One each morning to control diabetes     olmesartan 20 MG tablet  Commonly known as:  BENICAR  TAKE 1 TABLET (20 MG TOTAL) BY MOUTH DAILY.     simvastatin 20 MG tablet  Commonly known as:  ZOCOR  Take 20 mg by mouth daily.     vitamin B-12 1000 MCG tablet  Commonly known as:  CYANOCOBALAMIN  Take 1,000 mcg by mouth daily.        Review of Systems:  Review of Systems  Constitutional: Negative for fever, activity change, appetite change, fatigue and unexpected weight change.       Regained 8 pounds since his last visit  HENT: Negative for congestion, ear pain, hearing loss, rhinorrhea, sore throat, tinnitus, trouble swallowing and voice change.   Eyes:       Corrective lenses  Respiratory: Negative for cough, choking, chest tightness, shortness of breath and wheezing.   Cardiovascular: Negative for chest pain, palpitations and leg swelling.  Gastrointestinal: Negative for nausea, abdominal pain, diarrhea, constipation and abdominal distention.  Endocrine: Negative for cold intolerance,  heat intolerance, polydipsia, polyphagia and polyuria.  Genitourinary: Negative for dysuria, urgency, frequency and testicular pain.       Not incontinent  Musculoskeletal: Positive for back pain and neck pain. Negative for myalgias, arthralgias and gait problem.       Bilateral shoulder discomfort and left knee discomfort.  Skin: Negative for color change, pallor and rash.  Allergic/Immunologic: Negative.   Neurological: Negative for dizziness, tremors, syncope, speech difficulty, weakness, numbness and headaches.  Hematological: Negative for adenopathy. Bruises/bleeds easily.  Psychiatric/Behavioral: Negative for hallucinations, behavioral problems, confusion, sleep disturbance and decreased concentration. The patient is not nervous/anxious.     Health Maintenance  Topic Date Due  .  FOOT EXAM  03/21/1934  . OPHTHALMOLOGY EXAM  03/21/1934  . TETANUS/TDAP  03/22/1943  . ZOSTAVAX  03/21/1984  . PNA vac Low Risk Adult (2 of 2 - PCV13) 03/12/2012  . INFLUENZA VACCINE  01/25/2016  . HEMOGLOBIN A1C  03/04/2016    Physical Exam: Filed Vitals:   09/07/15 0940  BP: 132/58  Pulse: 64  Temp: 97.5 F (36.4 C)  TempSrc: Oral  Height: 5\' 8"  (1.727 m)  Weight: 155 lb (70.308 kg)  SpO2: 94%   Body mass index is 23.57 kg/(m^2). Physical Exam  Constitutional: He is oriented to person, place, and time. He appears well-developed and well-nourished. No distress.  HENT:  Right Ear: External ear normal.  Left Ear: External ear normal.  Nose: Nose normal.  Mouth/Throat: Oropharynx is clear and moist. No oropharyngeal exudate.  Eyes: Conjunctivae and EOM are normal. Pupils are equal, round, and reactive to light.  Neck: No JVD present. No tracheal deviation present. No thyromegaly present.  Cardiovascular: Normal rate, regular rhythm, normal heart sounds and intact distal pulses.  Exam reveals no gallop and no friction rub.   No murmur heard. Pulmonary/Chest: No respiratory distress. He has no wheezes. He has no rales. He exhibits no tenderness.  Abdominal: He exhibits no distension and no mass. There is no tenderness.  Musculoskeletal: Normal range of motion. He exhibits no edema or tenderness.  Bilateral shoulder discomfort. Mild swelling at left knee.  Lymphadenopathy:    He has no cervical adenopathy.  Neurological: He is alert and oriented to person, place, and time. He has normal reflexes. No cranial nerve deficit. Coordination normal.  Skin: No rash noted. No erythema. No pallor.  Psychiatric: He has a normal mood and affect. His behavior is normal. Judgment and thought content normal.    Labs reviewed: Basic Metabolic Panel:  Recent Labs  01/04/15 05/03/15 09/02/15  NA 135* 137 137  K 4.4 4.7 4.5  BUN 20 18 20   CREATININE 1.2 1.2 1.2    Liver Function  Tests:  Recent Labs  01/04/15  AST 17  ALT 15  ALKPHOS 56   No results for input(s): LIPASE, AMYLASE in the last 8760 hours. No results for input(s): AMMONIA in the last 8760 hours. CBC: No results for input(s): WBC, NEUTROABS, HGB, HCT, MCV, PLT in the last 8760 hours. Lipid Panel:  Recent Labs  05/03/15 09/02/15  CHOL 183 191  HDL 47 48  LDLCALC 114 121  TRIG 110 109   Lab Results  Component Value Date   HGBA1C 7.3 09/02/2015    Assessment/Plan 1. Cough Continue to monitor  2. Type 2 diabetes mellitus without complication, without long-term current use of insulin (HCC) -A1c, future -CMP, future -Microalbumin, future  3. Essential hypertension -CMP, future  4. Hyperlipidemia -Lipid panel, future

## 2015-10-15 DIAGNOSIS — M79671 Pain in right foot: Secondary | ICD-10-CM | POA: Diagnosis not present

## 2015-10-15 DIAGNOSIS — B351 Tinea unguium: Secondary | ICD-10-CM | POA: Diagnosis not present

## 2015-10-15 DIAGNOSIS — L84 Corns and callosities: Secondary | ICD-10-CM | POA: Diagnosis not present

## 2015-10-15 DIAGNOSIS — M79672 Pain in left foot: Secondary | ICD-10-CM | POA: Diagnosis not present

## 2015-12-27 DIAGNOSIS — I10 Essential (primary) hypertension: Secondary | ICD-10-CM | POA: Diagnosis not present

## 2015-12-27 DIAGNOSIS — E119 Type 2 diabetes mellitus without complications: Secondary | ICD-10-CM | POA: Diagnosis not present

## 2015-12-27 LAB — BASIC METABOLIC PANEL
BUN: 21 mg/dL (ref 4–21)
Creatinine: 1.2 mg/dL (ref 0.6–1.3)
GLUCOSE: 149 mg/dL
POTASSIUM: 4.8 mmol/L (ref 3.4–5.3)
SODIUM: 139 mmol/L (ref 137–147)

## 2015-12-27 LAB — MICROALBUMIN, URINE: Microalb, Ur: 0.2

## 2015-12-27 LAB — HEMOGLOBIN A1C: HEMOGLOBIN A1C: 8.1

## 2016-01-04 ENCOUNTER — Encounter: Payer: Self-pay | Admitting: Internal Medicine

## 2016-01-04 ENCOUNTER — Non-Acute Institutional Stay: Payer: Medicare Other | Admitting: Internal Medicine

## 2016-01-04 VITALS — BP 124/54 | HR 69 | Temp 97.8°F | Ht 68.0 in | Wt 156.0 lb

## 2016-01-04 DIAGNOSIS — E785 Hyperlipidemia, unspecified: Secondary | ICD-10-CM | POA: Diagnosis not present

## 2016-01-04 DIAGNOSIS — M25511 Pain in right shoulder: Secondary | ICD-10-CM | POA: Diagnosis not present

## 2016-01-04 DIAGNOSIS — M25512 Pain in left shoulder: Secondary | ICD-10-CM | POA: Diagnosis not present

## 2016-01-04 DIAGNOSIS — E119 Type 2 diabetes mellitus without complications: Secondary | ICD-10-CM | POA: Diagnosis not present

## 2016-01-04 DIAGNOSIS — M542 Cervicalgia: Secondary | ICD-10-CM

## 2016-01-04 DIAGNOSIS — I10 Essential (primary) hypertension: Secondary | ICD-10-CM

## 2016-01-04 HISTORY — DX: Cervicalgia: M54.2

## 2016-01-04 MED ORDER — NAPROXEN SODIUM 220 MG PO TABS: ORAL_TABLET | ORAL | Status: DC

## 2016-01-04 MED ORDER — METFORMIN HCL 1000 MG PO TABS: ORAL_TABLET | ORAL | Status: DC

## 2016-01-04 NOTE — Progress Notes (Signed)
Patient ID: Nathan Cook, male   DOB: [birth year], 82 y.o.   MRN: [id number]    [place] of Service: Clinic (12)     No Known Allergies  Chief Complaint  Patient presents with  . Medical Management of Chronic Issues    4 month medication management blood sugar, blood pressure, cholesterol, review labs. Patient stopped the Meformin on his owen months ago, because of the news. Checks his blood sugar at home about every other day, usually before lunch or dinner because it's highter before breakfast. BS range 125 -137.     HPI:  Type 2 diabetes mellitus without complication, without long-term current use of insulin ([place]) -  Patient stopped metformin couple months ago. Thought it was making him drowsy. He denies noting any change in bowel habits.  Essential hypertension - controlled on current medication  Hyperlipidemia - controlled  Pain of both shoulder joints - chronic condition with discomfort in joints. Can't raise his right arm as high as the left. No change in pains as compared to prior visits.  Cervicalgia - chronic condition. Says he "jammed the back" when flying. Previously saw a chiropractor which seemed to help. He is thinking about seeing a chiropractor locally.    Medications: Patient's Medications  New Prescriptions   No medications on file  Previous Medications   ASPIRIN 325 MG EC TABLET    Take 325 mg by mouth. Take one every other day   CHOLECALCIFEROL 1000 UNITS TABLET    Take 1,000 Units by mouth daily.   METFORMIN (GLUCOPHAGE) 1000 MG TABLET    One each morning to control diabetes   OLMESARTAN (BENICAR) 20 MG TABLET    TAKE 1 TABLET (20 MG TOTAL) BY MOUTH DAILY.   SIMVASTATIN (ZOCOR) 20 MG TABLET    Take 20 mg by mouth daily.   VITAMIN B-12 (CYANOCOBALAMIN) 1000 MCG TABLET    Take 1,000 mcg by mouth daily.  Modified Medications   No medications on file  Discontinued Medications   No medications on file     Review of Systems    Constitutional: Negative for fever, activity change, appetite change, fatigue and unexpected weight change.       Weight stable at 156#  HENT: Negative for congestion, ear pain, hearing loss, rhinorrhea, sore throat, tinnitus, trouble swallowing and voice change.   Eyes:       Corrective lenses  Respiratory: Negative for cough, choking, chest tightness, shortness of breath and wheezing.   Cardiovascular: Negative for chest pain, palpitations and leg swelling.  Gastrointestinal: Negative for nausea, abdominal pain, diarrhea, constipation and abdominal distention.  Endocrine: Negative for cold intolerance, heat intolerance, polydipsia, polyphagia and polyuria.  Genitourinary: Negative for dysuria, urgency, frequency and testicular pain.       Not incontinent  Musculoskeletal: Positive for back pain and neck pain. Negative for myalgias, arthralgias and gait problem.       Bilateral shoulder discomfort and left knee discomfort.  Skin: Negative for color change, pallor and rash.  Allergic/Immunologic: Negative.   Neurological: Negative for dizziness, tremors, syncope, speech difficulty, weakness, numbness and headaches.  Hematological: Negative for adenopathy. Bruises/bleeds easily.  Psychiatric/Behavioral: Negative for hallucinations, behavioral problems, confusion, sleep disturbance and decreased concentration. The patient is not nervous/anxious.     Filed Vitals:   01/04/16 0951  BP: 124/54  Pulse: 69  Temp: 97.8 F (36.6 C)  TempSrc: Oral  Height: '5\' 8"'$  (1.727 m)  Weight: 156 lb (70.761 kg)  SpO2: 94%   Wt Readings from Last 3 Encounters:  01/04/16 156 lb (70.761 kg)  09/07/15 155 lb (70.308 kg)  05/11/15 154 lb 12.8 oz (70.217 kg)    Body mass index is 23.73 kg/(m^2).  Physical Exam  Constitutional: He is oriented to person, place, and time. He appears well-developed and well-nourished. No distress.  HENT:  Right Ear: External ear normal.  Left Ear: External ear normal.   Nose: Nose normal.  Mouth/Throat: Oropharynx is clear and moist. No oropharyngeal exudate.  Eyes: Conjunctivae and EOM are normal. Pupils are equal, round, and reactive to light.  Neck: No JVD present. No tracheal deviation present. No thyromegaly present.  Cardiovascular: Normal rate, regular rhythm, normal heart sounds and intact distal pulses.  Exam reveals no gallop and no friction rub.   No murmur heard. Pulmonary/Chest: No respiratory distress. He has no wheezes. He has no rales. He exhibits no tenderness.  Abdominal: He exhibits no distension and no mass. There is no tenderness.  Musculoskeletal: Normal range of motion. He exhibits no edema or tenderness.  Bilateral shoulder discomfort. Mild swelling at left knee.  Lymphadenopathy:    He has no cervical adenopathy.  Neurological: He is alert and oriented to person, place, and time. He has normal reflexes. No cranial nerve deficit. Coordination normal.  Skin: No rash noted. No erythema. No pallor.  Psychiatric: He has a normal mood and affect. His behavior is normal. Judgment and thought content normal.     Labs reviewed: Lab Summary Latest Ref Rng 12/27/2015 09/02/2015 05/03/2015 01/04/2015  Hemoglobin - (None) (None) (None) (None)  Hematocrit - (None) (None) (None) (None)  White count - (None) (None) (None) (None)  Platelet count - (None) (None) (None) (None)  Sodium 137 - 147 mmol/L 139 137 137 135(A)  Potassium 3.4 - 5.3 mmol/L 4.8 4.5 4.7 4.4  Calcium - (None) (None) (None) (None)  Phosphorus - (None) (None) (None) (None)  Creatinine 0.6 - 1.3 mg/dL 1.2 1.2 1.2 1.2  AST 14 - 40 U/L (None) (None) (None) 17  Alk Phos 25 - 125 U/L (None) (None) (None) 56  Bilirubin - (None) (None) (None) (None)  Glucose - 149 124 137 143  Cholesterol 0 - 200 mg/dL (None) 191 183 (None)  HDL cholesterol 35 - 70 mg/dL (None) 48 47 (None)  Triglycerides 40 - 160 mg/dL (None) 109 110 (None)  LDL Direct - (None) (None) (None) (None)  LDL Calc -  (None) 121 114 (None)  Total protein - (None) (None) (None) (None)  Albumin - (None) (None) (None) (None)   No results found for: TSH Lab Results  Component Value Date   BUN 21 12/27/2015   BUN 20 09/02/2015   BUN 18 05/03/2015   Lab Results  Component Value Date   CREATININE 1.2 12/27/2015   CREATININE 1.2 09/02/2015   CREATININE 1.2 05/03/2015   Lab Results  Component Value Date   HGBA1C 8.1 12/27/2015   HGBA1C 7.3 09/02/2015   HGBA1C 7.4* 05/03/2015       Assessment/Plan  1. Type 2 diabetes mellitus without complication, without long-term current use of insulin (HCC) -Resume metformin 1000 mg each morning  2. Essential hypertension Continue current medication  3. Hyperlipidemia Continue current medication  4. Pain of both shoulder joints See chiropractor  5. Cervicalgia See chiropractor -Aleve twice daily as needed for pain

## 2016-01-21 DIAGNOSIS — L84 Corns and callosities: Secondary | ICD-10-CM | POA: Diagnosis not present

## 2016-01-21 DIAGNOSIS — B351 Tinea unguium: Secondary | ICD-10-CM | POA: Diagnosis not present

## 2016-01-21 DIAGNOSIS — M79671 Pain in right foot: Secondary | ICD-10-CM | POA: Diagnosis not present

## 2016-01-21 DIAGNOSIS — M79672 Pain in left foot: Secondary | ICD-10-CM | POA: Diagnosis not present

## 2016-02-01 ENCOUNTER — Other Ambulatory Visit: Payer: Self-pay | Admitting: Internal Medicine

## 2016-02-04 ENCOUNTER — Encounter: Payer: Self-pay | Admitting: Internal Medicine

## 2016-02-16 ENCOUNTER — Ambulatory Visit (INDEPENDENT_AMBULATORY_CARE_PROVIDER_SITE_OTHER): Payer: Medicare Other | Admitting: Podiatry

## 2016-02-16 ENCOUNTER — Encounter: Payer: Self-pay | Admitting: Podiatry

## 2016-02-16 VITALS — BP 154/63 | HR 80 | Resp 14

## 2016-02-16 DIAGNOSIS — B351 Tinea unguium: Secondary | ICD-10-CM | POA: Diagnosis not present

## 2016-02-16 DIAGNOSIS — T300 Burn of unspecified body region, unspecified degree: Secondary | ICD-10-CM

## 2016-02-16 DIAGNOSIS — M79676 Pain in unspecified toe(s): Secondary | ICD-10-CM

## 2016-02-16 DIAGNOSIS — L84 Corns and callosities: Secondary | ICD-10-CM | POA: Diagnosis not present

## 2016-02-16 DIAGNOSIS — L89892 Pressure ulcer of other site, stage 2: Secondary | ICD-10-CM

## 2016-02-16 NOTE — Progress Notes (Signed)
   Subjective:    Patient ID: Nathan Cook, male    DOB: July 10, 1921, 82 y.o.   MRN: [id number]  HPI this patient presents to the office with chief complaint of a painful area on the bottom of his right foot. He states that he frequently is seen by Dr. Mallie Mussel at [facility].  Dr. Mallie Mussel presently is on vacation and is not in his office this patient presents the office wearing a bandage on his right forefoot saying he has applied a medication which is in exfoliant. He says it started to become significantly painful for the last few days after using the exfoliatiant  on his foot.  He presents the office today for an evaluation and treatment of his right forefoot. He also relates needing to have his nails trimmed at this time. His big toenails are painful walking and wearing his shoes    Review of Systems  All other systems reviewed and are negative.      Objective:   Physical Exam GENERAL APPEARANCE: Alert, conversant. Appropriately groomed. No acute distress.  VASCULAR: Pedal pulses are  palpable at  [facility] and PT bilateral.  Capillary refill time is immediate to all digits,  Normal temperature gradient.   NEUROLOGIC: sensation is normal to 5.07 monofilament at 5/5 sites bilateral.  Light touch is intact bilateral, Muscle strength normal.  MUSCULOSKELETAL: acceptable muscle strength, tone and stability bilateral.  Intrinsic muscluature intact bilateral.  Hammer toes 2-4  B/L   DERMATOLOGIC: skin color, texture, and turgor are within normal limits.  No preulcerative lesions or ulcers  are seen, no interdigital maceration noted.  No open lesions present.  . No drainage noted.White necrotic scaling skin sub 2 right foot.  No redness or swelling noted.  Pain out of proportion.  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  Onychomycosis   IE  Debride nails.  Debride necrotic tissue sub 2 right foot.   Neosporin/DSD  Dispersion padding.  Home soaks in epsom salts followed by bandaging.  D/C acid usage. RTC 1 week as needed.   Gardiner Barefoot DPM

## 2016-02-17 ENCOUNTER — Ambulatory Visit: Payer: Medicare Other | Admitting: Podiatry

## 2016-04-03 ENCOUNTER — Other Ambulatory Visit: Payer: Self-pay | Admitting: Internal Medicine

## 2016-04-03 DIAGNOSIS — E119 Type 2 diabetes mellitus without complications: Secondary | ICD-10-CM

## 2016-04-10 ENCOUNTER — Other Ambulatory Visit: Payer: Self-pay | Admitting: *Deleted

## 2016-04-10 MED ORDER — OLMESARTAN MEDOXOMIL 20 MG PO TABS: ORAL_TABLET | ORAL | 1 refills | Status: DC

## 2016-04-10 NOTE — Telephone Encounter (Signed)
Nathan Cook 

## 2016-04-14 DIAGNOSIS — L84 Corns and callosities: Secondary | ICD-10-CM | POA: Diagnosis not present

## 2016-04-14 DIAGNOSIS — B351 Tinea unguium: Secondary | ICD-10-CM | POA: Diagnosis not present

## 2016-04-14 DIAGNOSIS — M79672 Pain in left foot: Secondary | ICD-10-CM | POA: Diagnosis not present

## 2016-04-14 DIAGNOSIS — M79671 Pain in right foot: Secondary | ICD-10-CM | POA: Diagnosis not present

## 2016-04-18 ENCOUNTER — Other Ambulatory Visit: Payer: Self-pay

## 2016-04-18 DIAGNOSIS — I10 Essential (primary) hypertension: Secondary | ICD-10-CM

## 2016-04-18 DIAGNOSIS — E119 Type 2 diabetes mellitus without complications: Secondary | ICD-10-CM

## 2016-04-18 DIAGNOSIS — E785 Hyperlipidemia, unspecified: Secondary | ICD-10-CM

## 2016-04-19 ENCOUNTER — Other Ambulatory Visit: Payer: Self-pay

## 2016-04-19 DIAGNOSIS — I10 Essential (primary) hypertension: Secondary | ICD-10-CM

## 2016-04-19 DIAGNOSIS — E119 Type 2 diabetes mellitus without complications: Secondary | ICD-10-CM

## 2016-04-19 DIAGNOSIS — E785 Hyperlipidemia, unspecified: Secondary | ICD-10-CM

## 2016-05-08 ENCOUNTER — Other Ambulatory Visit: Payer: Self-pay | Admitting: Internal Medicine

## 2016-05-08 DIAGNOSIS — E785 Hyperlipidemia, unspecified: Secondary | ICD-10-CM | POA: Diagnosis not present

## 2016-05-08 DIAGNOSIS — I10 Essential (primary) hypertension: Secondary | ICD-10-CM | POA: Diagnosis not present

## 2016-05-08 DIAGNOSIS — E119 Type 2 diabetes mellitus without complications: Secondary | ICD-10-CM | POA: Diagnosis not present

## 2016-05-08 LAB — COMPLETE METABOLIC PANEL WITH GFR
ALBUMIN: 3.9 g/dL (ref 3.6–5.1)
ALK PHOS: 59 U/L (ref 40–115)
ALT: 15 U/L (ref 9–46)
AST: 18 U/L (ref 10–35)
BILIRUBIN TOTAL: 0.9 mg/dL (ref 0.2–1.2)
BUN: 19 mg/dL (ref 7–25)
CALCIUM: 9.3 mg/dL (ref 8.6–10.3)
CO2: 26 mmol/L (ref 20–31)
Chloride: 104 mmol/L (ref 98–110)
Creat: 1.34 mg/dL — ABNORMAL HIGH (ref 0.70–1.11)
GFR, EST NON AFRICAN AMERICAN: 46 mL/min — AB (ref >=60)
GFR, Est African American: 53 mL/min — ABNORMAL LOW (ref >=60)
GLUCOSE: 174 mg/dL — AB (ref 65–99)
POTASSIUM: 4.4 mmol/L (ref 3.5–5.3)
SODIUM: 138 mmol/L (ref 135–146)
TOTAL PROTEIN: 6.5 g/dL (ref 6.1–8.1)

## 2016-05-08 LAB — LIPID PANEL
CHOL/HDL RATIO: 3.7 ratio (ref <5.0)
CHOLESTEROL: 211 mg/dL — AB (ref <200)
HDL: 57 mg/dL (ref >40)
LDL Cholesterol: 131 mg/dL — ABNORMAL HIGH (ref <100)
Triglycerides: 113 mg/dL (ref <150)
VLDL: 23 mg/dL (ref <30)

## 2016-05-08 LAB — HEMOGLOBIN A1C
Hgb A1c MFr Bld: 7.2 % — ABNORMAL HIGH (ref <5.7)
Mean Plasma Glucose: 160 mg/dL

## 2016-05-16 ENCOUNTER — Encounter: Payer: Self-pay | Admitting: Internal Medicine

## 2016-05-16 ENCOUNTER — Non-Acute Institutional Stay: Payer: Medicare Other | Admitting: Internal Medicine

## 2016-05-16 VITALS — BP 104/56 | HR 74 | Temp 97.4°F | Ht 68.0 in | Wt 153.0 lb

## 2016-05-16 DIAGNOSIS — E119 Type 2 diabetes mellitus without complications: Secondary | ICD-10-CM

## 2016-05-16 DIAGNOSIS — E785 Hyperlipidemia, unspecified: Secondary | ICD-10-CM | POA: Diagnosis not present

## 2016-05-16 DIAGNOSIS — I10 Essential (primary) hypertension: Secondary | ICD-10-CM

## 2016-05-16 NOTE — Progress Notes (Signed)
  Facility  [facility]    Place of Service: Clinic (12)     No Known Allergies  Chief Complaint  Patient presents with  . Medical Management of Chronic Issues    4 month medication management blood sugar, blood pressure, cholesterol, review labs. Checks blood sugar once daily usually at lunch. Checked this morning it was 176, always higher in morning.    HPI:   Type 2 diabetes mellitus without complication, without long-term current use of insulin (HCC) - about 1/2 of the morning glucose determinations are high, but the A1c fell to 7/2 from 8.1. Not fully compliant with hs diet.  Essential hypertension - controlled  Hyperlipidemia, unspecified hyperlipidemia type  controlled    Medications: Patient's Medications  New Prescriptions   No medications on file  Previous Medications   ASPIRIN 325 MG EC TABLET    Take 325 mg by mouth. Take one every other day   CHOLECALCIFEROL 1000 UNITS TABLET    Take 1,000 Units by mouth daily.   METFORMIN (GLUCOPHAGE) 1000 MG TABLET    TAKE 1 TABLET BY MOUTH EVERY MORNING TO CONTROL DIABETES   NAPROXEN SODIUM (ALEVE) 220 MG TABLET    One up to twice daily to help arthritis   OLMESARTAN (BENICAR) 20 MG TABLET    Take one tablet by mouth once daily   SALICYLIC ACID-UREA (EXFOLIATING MOISTURIZER EX)    Apply topically. Kamer cream   VITAMIN B-12 (CYANOCOBALAMIN) 1000 MCG TABLET    Take 1,000 mcg by mouth daily.  Modified Medications   No medications on file  Discontinued Medications   SILVER SULFADIAZINE (SILVADENE) 1 % CREAM    Apply 1 application topically daily.   SIMVASTATIN (ZOCOR) 20 MG TABLET    Take 20 mg by mouth daily.   UNDECYLENIC ACID (FUNGI-NAIL EX)    Apply topically.     Review of Systems  Constitutional: Negative for activity change, appetite change, fatigue, fever and unexpected weight change.  HENT: Negative for congestion, ear pain, hearing loss, rhinorrhea, sore throat, tinnitus, trouble swallowing and voice change.   Eyes:         Corrective lenses  Respiratory: Negative for cough, choking, chest tightness, shortness of breath and wheezing.   Cardiovascular: Negative for chest pain, palpitations and leg swelling.  Gastrointestinal: Negative for abdominal distention, abdominal pain, constipation, diarrhea and nausea.  Endocrine: Negative for cold intolerance, heat intolerance, polydipsia, polyphagia and polyuria.  Genitourinary: Negative for dysuria, frequency, testicular pain and urgency.       Not incontinent  Musculoskeletal: Positive for back pain and neck pain. Negative for arthralgias, gait problem and myalgias.       Bilateral shoulder discomfort and left knee discomfort.  Skin: Negative for color change, pallor and rash.  Allergic/Immunologic: Negative.   Neurological: Negative for dizziness, tremors, syncope, speech difficulty, weakness, numbness and headaches.  Hematological: Negative for adenopathy. Bruises/bleeds easily.  Psychiatric/Behavioral: Negative for behavioral problems, confusion, decreased concentration, hallucinations and sleep disturbance. The patient is not nervous/anxious.     Vitals:   05/16/16 0901  BP: (!) 104/56  Pulse: 74  Temp: 97.4 F (36.3 C)  TempSrc: Oral  SpO2: 94%  Weight: 153 lb (69.4 kg)  Height: 5' 8" (1.727 m)   Wt Readings from Last 3 Encounters:  05/16/16 153 lb (69.4 kg)  01/04/16 156 lb (70.8 kg)  09/07/15 155 lb (70.3 kg)    Body mass index is 23.26 kg/m.  Physical Exam  Constitutional: He is oriented to person, place,   and time. He appears well-developed and well-nourished. No distress.  HENT:  Right Ear: External ear normal.  Left Ear: External ear normal.  Nose: Nose normal.  Mouth/Throat: Oropharynx is clear and moist. No oropharyngeal exudate.  Eyes: Conjunctivae and EOM are normal. Pupils are equal, round, and reactive to light.  Neck: No JVD present. No tracheal deviation present. No thyromegaly present.  Cardiovascular: Normal rate,  regular rhythm, normal heart sounds and intact distal pulses.  Exam reveals no gallop and no friction rub.   No murmur heard. Pulmonary/Chest: No respiratory distress. He has no wheezes. He has no rales. He exhibits no tenderness.  Abdominal: He exhibits no distension and no mass. There is no tenderness.  Musculoskeletal: Normal range of motion. He exhibits no edema or tenderness.  Bilateral shoulder discomfort. Mild swelling at left knee.  Lymphadenopathy:    He has no cervical adenopathy.  Neurological: He is alert and oriented to person, place, and time. He has normal reflexes. No cranial nerve deficit. Coordination normal.  Skin: No rash noted. No erythema. No pallor.  Psychiatric: He has a normal mood and affect. His behavior is normal. Judgment and thought content normal.     Labs reviewed: Lab Summary Latest Ref Rng & Units 05/08/2016 12/27/2015 09/02/2015 05/03/2015  Hemoglobin - (None) (None) (None) (None)  Hematocrit - (None) (None) (None) (None)  White count - (None) (None) (None) (None)  Platelet count - (None) (None) (None) (None)  Sodium 135 - 146 mmol/L 138 139 137 137  Potassium 3.5 - 5.3 mmol/L 4.4 4.8 4.5 4.7  Calcium 8.6 - 10.3 mg/dL 9.3 (None) (None) (None)  Phosphorus - (None) (None) (None) (None)  Creatinine 0.70 - 1.11 mg/dL 1.34(H) 1.2 1.2 1.2  AST 10 - 35 U/L 18 (None) (None) (None)  Alk Phos 40 - 115 U/L 59 (None) (None) (None)  Bilirubin 0.2 - 1.2 mg/dL 0.9 (None) (None) (None)  Glucose 65 - 99 mg/dL 174(H) 149 124 137  Cholesterol <200 mg/dL 211(H) (None) 191 183  HDL cholesterol >40 mg/dL 57 (None) 48 47  Triglycerides <150 mg/dL 113 (None) 109 110  LDL Direct - (None) (None) (None) (None)  LDL Calc <100 mg/dL 131(H) (None) 121 114  Total protein 6.1 - 8.1 g/dL 6.5 (None) (None) (None)  Albumin 3.6 - 5.1 g/dL 3.9 (None) (None) (None)  Some recent data might be hidden   No results found for: TSH Lab Results  Component Value Date   BUN 19 05/08/2016    BUN 21 12/27/2015   BUN 20 09/02/2015   Lab Results  Component Value Date   CREATININE 1.34 (H) 05/08/2016   CREATININE 1.2 12/27/2015   CREATININE 1.2 09/02/2015   Lab Results  Component Value Date   HGBA1C 7.2 (H) 05/08/2016   HGBA1C 8.1 12/27/2015   HGBA1C 7.3 09/02/2015       Assessment/Plan  1. Type 2 diabetes mellitus without complication, without long-term current use of insulin (HCC) - Hemoglobin A1c; Future - Comprehensive metabolic panel; Future - Microalbumin, urine; Future  2. Essential hypertension - Comprehensive metabolic panel; Future  3. Hyperlipidemia, unspecified hyperlipidemia type - Lipid panel; Future

## 2016-06-12 ENCOUNTER — Other Ambulatory Visit: Payer: Self-pay | Admitting: *Deleted

## 2016-06-14 ENCOUNTER — Other Ambulatory Visit: Payer: Self-pay | Admitting: *Deleted

## 2016-06-14 MED ORDER — ONETOUCH ULTRA SYSTEM W/DEVICE KIT: PACK | 0 refills | Status: DC

## 2016-06-14 NOTE — Telephone Encounter (Signed)
[facility] 

## 2016-07-06 ENCOUNTER — Other Ambulatory Visit: Payer: Self-pay | Admitting: *Deleted

## 2016-07-06 MED ORDER — GLUCOSE BLOOD VI STRP: ORAL_STRIP | 3 refills | Status: DC

## 2016-07-06 NOTE — Telephone Encounter (Signed)
[facility] 

## 2016-07-07 DIAGNOSIS — E119 Type 2 diabetes mellitus without complications: Secondary | ICD-10-CM | POA: Diagnosis not present

## 2016-07-07 DIAGNOSIS — H5203 Hypermetropia, bilateral: Secondary | ICD-10-CM | POA: Diagnosis not present

## 2016-07-07 LAB — HM DIABETES EYE EXAM

## 2016-07-10 ENCOUNTER — Encounter: Payer: Self-pay | Admitting: *Deleted

## 2016-07-28 ENCOUNTER — Other Ambulatory Visit: Payer: Self-pay | Admitting: *Deleted

## 2016-07-28 MED ORDER — GLUCOSE BLOOD VI STRP: ORAL_STRIP | 3 refills | Status: DC

## 2016-07-28 NOTE — Telephone Encounter (Signed)
[facility] 

## 2016-08-08 DIAGNOSIS — B351 Tinea unguium: Secondary | ICD-10-CM | POA: Diagnosis not present

## 2016-08-08 DIAGNOSIS — L84 Corns and callosities: Secondary | ICD-10-CM | POA: Diagnosis not present

## 2016-08-08 DIAGNOSIS — M79671 Pain in right foot: Secondary | ICD-10-CM | POA: Diagnosis not present

## 2016-08-08 DIAGNOSIS — M79672 Pain in left foot: Secondary | ICD-10-CM | POA: Diagnosis not present

## 2016-09-01 ENCOUNTER — Other Ambulatory Visit: Payer: Self-pay

## 2016-09-01 DIAGNOSIS — E785 Hyperlipidemia, unspecified: Secondary | ICD-10-CM

## 2016-09-01 DIAGNOSIS — E119 Type 2 diabetes mellitus without complications: Secondary | ICD-10-CM

## 2016-09-01 DIAGNOSIS — I10 Essential (primary) hypertension: Secondary | ICD-10-CM

## 2016-09-04 ENCOUNTER — Other Ambulatory Visit: Payer: Medicare Other

## 2016-09-04 DIAGNOSIS — E119 Type 2 diabetes mellitus without complications: Secondary | ICD-10-CM | POA: Diagnosis not present

## 2016-09-04 DIAGNOSIS — I10 Essential (primary) hypertension: Secondary | ICD-10-CM | POA: Diagnosis not present

## 2016-09-04 DIAGNOSIS — E785 Hyperlipidemia, unspecified: Secondary | ICD-10-CM | POA: Diagnosis not present

## 2016-09-04 LAB — HEMOGLOBIN A1C
HEMOGLOBIN A1C: 7.2 % — AB (ref <5.7)
MEAN PLASMA GLUCOSE: 160 mg/dL

## 2016-09-05 LAB — COMPREHENSIVE METABOLIC PANEL
ALT: 14 U/L (ref 9–46)
AST: 18 U/L (ref 10–35)
Albumin: 3.9 g/dL (ref 3.6–5.1)
Alkaline Phosphatase: 54 U/L (ref 40–115)
BUN: 22 mg/dL (ref 7–25)
CALCIUM: 9.1 mg/dL (ref 8.6–10.3)
CO2: 26 mmol/L (ref 20–31)
Chloride: 105 mmol/L (ref 98–110)
Creat: 1.34 mg/dL — ABNORMAL HIGH (ref 0.70–1.11)
Glucose, Bld: 146 mg/dL — ABNORMAL HIGH (ref 65–99)
POTASSIUM: 4.4 mmol/L (ref 3.5–5.3)
Sodium: 139 mmol/L (ref 135–146)
Total Bilirubin: 0.8 mg/dL (ref 0.2–1.2)
Total Protein: 6.4 g/dL (ref 6.1–8.1)

## 2016-09-05 LAB — MICROALBUMIN, URINE: MICROALB UR: 0.2 mg/dL

## 2016-09-05 LAB — LIPID PANEL
CHOL/HDL RATIO: 3.7 ratio (ref <5.0)
Cholesterol: 205 mg/dL — ABNORMAL HIGH (ref <200)
HDL: 55 mg/dL (ref >40)
LDL CALC: 130 mg/dL — AB (ref <100)
Triglycerides: 100 mg/dL (ref <150)
VLDL: 20 mg/dL (ref <30)

## 2016-09-11 ENCOUNTER — Other Ambulatory Visit: Payer: Self-pay | Admitting: *Deleted

## 2016-09-11 MED ORDER — UNILET COMFORTOUCH LANCET MISC: 3 refills | Status: DC

## 2016-09-11 NOTE — Telephone Encounter (Signed)
[facility] 

## 2016-09-12 ENCOUNTER — Encounter: Payer: Self-pay | Admitting: Internal Medicine

## 2016-09-12 ENCOUNTER — Non-Acute Institutional Stay: Payer: Medicare Other | Admitting: Internal Medicine

## 2016-09-12 VITALS — BP 116/56 | HR 73 | Temp 97.6°F | Ht 68.0 in | Wt 153.0 lb

## 2016-09-12 DIAGNOSIS — E785 Hyperlipidemia, unspecified: Secondary | ICD-10-CM

## 2016-09-12 DIAGNOSIS — E119 Type 2 diabetes mellitus without complications: Secondary | ICD-10-CM | POA: Diagnosis not present

## 2016-09-12 DIAGNOSIS — I10 Essential (primary) hypertension: Secondary | ICD-10-CM | POA: Diagnosis not present

## 2016-09-12 NOTE — Progress Notes (Signed)
[place] of Service: Clinic (12)     No Known Allergies  Chief Complaint  Patient presents with  . Medical Management of Chronic Issues    4 month medication management blood sugar, blood pressure, cholesterol, review labs. Checks BS every other day before lunch. Range 140's     HPI:   Type 2 diabetes mellitus without complication, without long-term current use of insulin ([place]) - controlled. Morning glucose runniing in 140's.   Essential hypertension - controlled  Hyperlipidemia, unspecified hyperlipidemia type - controlled. He resumed the simvastatin.  Has noted a little discomfort on the mid left neck in the area of the hyoid bone from time to time. Not really uncomfortable. Notes it when shaving.  Medications: Patient's Medications  New Prescriptions   No medications on file  Previous Medications   ASPIRIN 325 MG EC TABLET    Take 325 mg by mouth. Take one every other day   BLOOD GLUCOSE MONITORING SUPPL (ONE TOUCH ULTRA SYSTEM KIT) W/DEVICE KIT    Use to test blood sugar twice daily. Dx E11.9   CHOLECALCIFEROL 1000 UNITS TABLET    Take 1,000 Units by mouth daily.   GLUCOSE BLOOD TEST STRIP    One Touch Ultra Test Strip Sig:Use to test blood sugar twice daily. Dx E11.9   LANCETS (UNILET COMFORTOUCH LANCET) MISC    Use to test blood sugar twice daily. Dx: E11.9   METFORMIN (GLUCOPHAGE) 1000 MG TABLET    TAKE 1 TABLET BY MOUTH EVERY MORNING TO CONTROL DIABETES   NAPROXEN SODIUM (ALEVE) 220 MG TABLET    One up to twice daily to help arthritis   OLMESARTAN (BENICAR) 20 MG TABLET    Take one tablet by mouth once daily   SALICYLIC ACID-UREA (EXFOLIATING MOISTURIZER EX)    Apply topically. Kamer cream   SIMVASTATIN (ZOCOR) 20 MG TABLET    Take 20 mg by mouth. Take one tablet daily for cholesterol   VITAMIN B-12 (CYANOCOBALAMIN) 1000 MCG TABLET    Take 1,000 mcg by mouth daily.  Modified Medications   No medications on file  Discontinued Medications   No  medications on file     Review of Systems  Constitutional: Negative for activity change, appetite change, fatigue, fever and unexpected weight change.  HENT: Negative for congestion, ear pain, hearing loss, rhinorrhea, sore throat, tinnitus, trouble swallowing and voice change.   Eyes:       Corrective lenses  Respiratory: Negative for cough, choking, chest tightness, shortness of breath and wheezing.   Cardiovascular: Negative for chest pain, palpitations and leg swelling.  Gastrointestinal: Negative for abdominal distention, abdominal pain, constipation, diarrhea and nausea.  Endocrine: Negative for cold intolerance, heat intolerance, polydipsia, polyphagia and polyuria.  Genitourinary: Negative for dysuria, frequency, testicular pain and urgency.       Not incontinent  Musculoskeletal: Positive for back pain and neck pain. Negative for arthralgias, gait problem and myalgias.       Bilateral shoulder discomfort and left knee discomfort.  Skin: Negative for color change, pallor and rash.  Allergic/Immunologic: Negative.   Neurological: Negative for dizziness, tremors, syncope, speech difficulty, weakness, numbness and headaches.  Hematological: Negative for adenopathy. Bruises/bleeds easily.  Psychiatric/Behavioral: Negative for behavioral problems, confusion, decreased concentration, hallucinations and sleep disturbance. The patient is not nervous/anxious.     Vitals:   09/12/16 0932  BP: (!) 116/56  Pulse: 73  Temp: 97.6 F (36.4 C)  TempSrc: Oral  SpO2: 95%  Weight: 153 lb (69.4 kg)  Height: 5' 8" (1.727 m)   Wt Readings from Last 3 Encounters:  09/12/16 153 lb (69.4 kg)  05/16/16 153 lb (69.4 kg)  01/04/16 156 lb (70.8 kg)    Body mass index is 23.26 kg/m.  Physical Exam  Constitutional: He is oriented to person, place, and time. He appears well-developed and well-nourished. No distress.  HENT:  Right Ear: External ear normal.  Left Ear: External ear normal.    Nose: Nose normal.  Mouth/Throat: Oropharynx is clear and moist. No oropharyngeal exudate.  Eyes: Conjunctivae and EOM are normal. Pupils are equal, round, and reactive to light.  Neck: No JVD present. No tracheal deviation present. No thyromegaly present.  Mild tenderness of the left hyoid bone tip.  Cardiovascular: Normal rate, regular rhythm, normal heart sounds and intact distal pulses.  Exam reveals no gallop and no friction rub.   No murmur heard. Pulmonary/Chest: No respiratory distress. He has no wheezes. He has no rales. He exhibits no tenderness.  Abdominal: He exhibits no distension and no mass. There is no tenderness.  Musculoskeletal: Normal range of motion. He exhibits no edema or tenderness.  Bilateral shoulder discomfort. Mild swelling at left knee.  Lymphadenopathy:    He has no cervical adenopathy.  Neurological: He is alert and oriented to person, place, and time. He has normal reflexes. No cranial nerve deficit. Coordination normal.  Skin: No rash noted. No erythema. No pallor.  Psychiatric: He has a normal mood and affect. His behavior is normal. Judgment and thought content normal.     Labs reviewed: Lab Summary Latest Ref Rng & Units 09/04/2016 05/08/2016 12/27/2015  Hemoglobin - (None) (None) (None)  Hematocrit - (None) (None) (None)  White count - (None) (None) (None)  Platelet count - (None) (None) (None)  Sodium 135 - 146 mmol/L 139 138 139  Potassium 3.5 - 5.3 mmol/L 4.4 4.4 4.8  Calcium 8.6 - 10.3 mg/dL 9.1 9.3 (None)  Phosphorus - (None) (None) (None)  Creatinine 0.70 - 1.11 mg/dL 1.34(H) 1.34(H) 1.2  AST 10 - 35 U/L 18 18 (None)  Alk Phos 40 - 115 U/L 54 59 (None)  Bilirubin 0.2 - 1.2 mg/dL 0.8 0.9 (None)  Glucose 65 - 99 mg/dL 146(H) 174(H) 149  Cholesterol <200 mg/dL 205(H) 211(H) (None)  HDL cholesterol >40 mg/dL 55 57 (None)  Triglycerides <150 mg/dL 100 113 (None)  LDL Direct - (None) (None) (None)  LDL Calc <100 mg/dL 130(H) 131(H) (None)   Total protein 6.1 - 8.1 g/dL 6.4 6.5 (None)  Albumin 3.6 - 5.1 g/dL 3.9 3.9 (None)  Some recent data might be hidden   No results found for: TSH Lab Results  Component Value Date   BUN 22 09/04/2016   BUN 19 05/08/2016   BUN 21 12/27/2015   Lab Results  Component Value Date   CREATININE 1.34 (H) 09/04/2016   CREATININE 1.34 (H) 05/08/2016   CREATININE 1.2 12/27/2015   Lab Results  Component Value Date   HGBA1C 7.2 (H) 09/04/2016   HGBA1C 7.2 (H) 05/08/2016   HGBA1C 8.1 12/27/2015    Assessment/Plan  1. Type 2 diabetes mellitus without complication, without long-term current use of insulin (HCC) - Hemoglobin A1c; Future - Basic metabolic panel; Future  2. Essential hypertension - Basic metabolic panel; Future  3. Hyperlipidemia, unspecified hyperlipidemia type - Lipid panel; Future

## 2016-09-13 ENCOUNTER — Other Ambulatory Visit: Payer: Self-pay | Admitting: *Deleted

## 2016-09-13 MED ORDER — UNILET COMFORTOUCH LANCET MISC: 3 refills | Status: DC

## 2016-09-13 NOTE — Telephone Encounter (Signed)
[facility] 

## 2016-09-28 ENCOUNTER — Other Ambulatory Visit: Payer: Self-pay | Admitting: Internal Medicine

## 2016-09-28 DIAGNOSIS — E119 Type 2 diabetes mellitus without complications: Secondary | ICD-10-CM

## 2016-11-02 ENCOUNTER — Other Ambulatory Visit: Payer: Self-pay | Admitting: Internal Medicine

## 2016-11-03 DIAGNOSIS — L84 Corns and callosities: Secondary | ICD-10-CM | POA: Diagnosis not present

## 2016-11-03 DIAGNOSIS — B351 Tinea unguium: Secondary | ICD-10-CM | POA: Diagnosis not present

## 2016-11-03 DIAGNOSIS — M79671 Pain in right foot: Secondary | ICD-10-CM | POA: Diagnosis not present

## 2016-11-03 DIAGNOSIS — M79672 Pain in left foot: Secondary | ICD-10-CM | POA: Diagnosis not present

## 2016-11-21 ENCOUNTER — Other Ambulatory Visit: Payer: Self-pay | Admitting: *Deleted

## 2016-11-21 MED ORDER — SIMVASTATIN 20 MG PO TABS: ORAL_TABLET | ORAL | 1 refills | Status: DC

## 2016-11-21 NOTE — Telephone Encounter (Signed)
[employer] [facility] 

## 2016-12-05 ENCOUNTER — Encounter: Payer: Self-pay | Admitting: Internal Medicine

## 2016-12-21 NOTE — Addendum Note (Signed)
Addended by: Royann Shivers A on: 12/21/2016 03:53 PM   Modules accepted: Orders

## 2016-12-30 ENCOUNTER — Other Ambulatory Visit: Payer: Self-pay | Admitting: Internal Medicine

## 2016-12-30 DIAGNOSIS — E119 Type 2 diabetes mellitus without complications: Secondary | ICD-10-CM

## 2017-01-03 ENCOUNTER — Other Ambulatory Visit: Payer: Self-pay | Admitting: Internal Medicine

## 2017-01-03 DIAGNOSIS — E119 Type 2 diabetes mellitus without complications: Secondary | ICD-10-CM

## 2017-01-05 DIAGNOSIS — M79672 Pain in left foot: Secondary | ICD-10-CM | POA: Diagnosis not present

## 2017-01-05 DIAGNOSIS — L84 Corns and callosities: Secondary | ICD-10-CM | POA: Diagnosis not present

## 2017-01-05 DIAGNOSIS — B351 Tinea unguium: Secondary | ICD-10-CM | POA: Diagnosis not present

## 2017-01-05 DIAGNOSIS — M79671 Pain in right foot: Secondary | ICD-10-CM | POA: Diagnosis not present

## 2017-02-02 ENCOUNTER — Other Ambulatory Visit: Payer: Self-pay

## 2017-02-02 DIAGNOSIS — I10 Essential (primary) hypertension: Secondary | ICD-10-CM

## 2017-02-02 DIAGNOSIS — E785 Hyperlipidemia, unspecified: Secondary | ICD-10-CM

## 2017-02-16 ENCOUNTER — Telehealth: Payer: Self-pay | Admitting: Internal Medicine

## 2017-02-16 NOTE — Telephone Encounter (Signed)
Called to schedule AWV-I at [facility] clinic but no answer and unable to leave a message. VDM (DD)

## 2017-02-22 NOTE — Telephone Encounter (Signed)
2nd attempt to call and schedule AWV-I but no answer and no option to leave a message. VDM (DD)

## 2017-03-06 DIAGNOSIS — L814 Other melanin hyperpigmentation: Secondary | ICD-10-CM | POA: Diagnosis not present

## 2017-03-06 DIAGNOSIS — L821 Other seborrheic keratosis: Secondary | ICD-10-CM | POA: Diagnosis not present

## 2017-03-06 DIAGNOSIS — D485 Neoplasm of uncertain behavior of skin: Secondary | ICD-10-CM | POA: Diagnosis not present

## 2017-03-06 DIAGNOSIS — L570 Actinic keratosis: Secondary | ICD-10-CM | POA: Diagnosis not present

## 2017-03-07 DIAGNOSIS — C44612 Basal cell carcinoma of skin of right upper limb, including shoulder: Secondary | ICD-10-CM | POA: Diagnosis not present

## 2017-03-12 DIAGNOSIS — E119 Type 2 diabetes mellitus without complications: Secondary | ICD-10-CM | POA: Diagnosis not present

## 2017-03-12 DIAGNOSIS — I10 Essential (primary) hypertension: Secondary | ICD-10-CM | POA: Diagnosis not present

## 2017-03-12 DIAGNOSIS — E785 Hyperlipidemia, unspecified: Secondary | ICD-10-CM | POA: Diagnosis not present

## 2017-03-12 LAB — COMPREHENSIVE METABOLIC PANEL
AG RATIO: 1.6 (calc) (ref 1.0–2.5)
ALKALINE PHOSPHATASE (APISO): 53 U/L (ref 40–115)
ALT: 22 U/L (ref 9–46)
AST: 19 U/L (ref 10–35)
Albumin: 3.8 g/dL (ref 3.6–5.1)
BILIRUBIN TOTAL: 0.8 mg/dL (ref 0.2–1.2)
BUN/Creatinine Ratio: 14 (calc) (ref 6–22)
BUN: 16 mg/dL (ref 7–25)
CALCIUM: 9.4 mg/dL (ref 8.6–10.3)
CO2: 27 mmol/L (ref 20–32)
Chloride: 103 mmol/L (ref 98–110)
Creat: 1.18 mg/dL — ABNORMAL HIGH (ref 0.70–1.11)
Globulin: 2.4 g/dL (calc) (ref 1.9–3.7)
Glucose, Bld: 157 mg/dL — ABNORMAL HIGH (ref 65–99)
POTASSIUM: 4.3 mmol/L (ref 3.5–5.3)
SODIUM: 137 mmol/L (ref 135–146)
Total Protein: 6.2 g/dL (ref 6.1–8.1)

## 2017-03-12 LAB — LIPID PANEL
CHOL/HDL RATIO: 2.3 (calc) (ref <5.0)
CHOLESTEROL: 144 mg/dL (ref <200)
HDL: 63 mg/dL (ref >40)
LDL CHOLESTEROL (CALC): 63 mg/dL
Non-HDL Cholesterol (Calc): 81 mg/dL (calc) (ref <130)
Triglycerides: 99 mg/dL (ref <150)

## 2017-03-12 LAB — HEMOGLOBIN A1C
EAG (MMOL/L): 9.8 (calc)
Hgb A1c MFr Bld: 7.8 % of total Hgb — ABNORMAL HIGH (ref <5.7)
Mean Plasma Glucose: 177 (calc)

## 2017-03-20 ENCOUNTER — Encounter: Payer: Self-pay | Admitting: Internal Medicine

## 2017-03-21 ENCOUNTER — Encounter: Payer: Self-pay | Admitting: Internal Medicine

## 2017-03-21 ENCOUNTER — Non-Acute Institutional Stay: Payer: Medicare Other | Admitting: Internal Medicine

## 2017-03-21 VITALS — BP 118/60 | HR 86 | Temp 97.3°F | Resp 18 | Ht 68.0 in | Wt 152.6 lb

## 2017-03-21 DIAGNOSIS — E559 Vitamin D deficiency, unspecified: Secondary | ICD-10-CM

## 2017-03-21 DIAGNOSIS — H04123 Dry eye syndrome of bilateral lacrimal glands: Secondary | ICD-10-CM | POA: Diagnosis not present

## 2017-03-21 DIAGNOSIS — Z8673 Personal history of transient ischemic attack (TIA), and cerebral infarction without residual deficits: Secondary | ICD-10-CM | POA: Insufficient documentation

## 2017-03-21 DIAGNOSIS — E785 Hyperlipidemia, unspecified: Secondary | ICD-10-CM | POA: Diagnosis not present

## 2017-03-21 DIAGNOSIS — N183 Chronic kidney disease, stage 3 unspecified: Secondary | ICD-10-CM | POA: Insufficient documentation

## 2017-03-21 DIAGNOSIS — I10 Essential (primary) hypertension: Secondary | ICD-10-CM

## 2017-03-21 DIAGNOSIS — E1122 Type 2 diabetes mellitus with diabetic chronic kidney disease: Secondary | ICD-10-CM | POA: Diagnosis not present

## 2017-03-21 MED ORDER — SIMVASTATIN 10 MG PO TABS: 10.0000 mg | ORAL_TABLET | Freq: Every day | ORAL | 3 refills | Status: DC

## 2017-03-21 NOTE — Progress Notes (Signed)
[facility]  Provider: Blanchie Serve MD   Location:  [place] of Service:  Clinic (12)  PCP: Blanchie Serve, MD Patient Care Team: Blanchie Serve, MD as PCP - General (Internal Medicine)  Extended Emergency Contact Information Primary Emergency Contact: [place] Address: [address]          [place], [place] [address] [name] of [place] Phone: [phone] Relation: Daughter   Goals of Care: Advanced Directive information Advanced Directives 09/12/2016  Does Patient Have a Medical Advance Directive? Yes  Type of Paramedic of [place];Living will  Copy of [place] in Chart? Yes    Chief Complaint  Patient presents with  . Medical Management of Chronic Issues    6 month follow up. Patient has concerns about his dry eyes.  . Medication Refill    No refills needed at this time.  Marland Kitchen Results    Discuss labs    HPI: Patient is a 83 y.o. male seen today for routine visit. His dry eye bothers him. He is using lubricating eye drops. Dry eyes limits his reading that he enjoys a lot. Uses his eye drops 3-4 times a day. Has occasional itching but denies pain or redness.   HTN- denies symptom. Currently on benicar 20 mg daily.   Hyperlipidemia- currently on simvastatin, denies myalgias  Shoulder OA- pain controlled at present. 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 Blood suagr reading at home in 110-120 range. Lowest reading 98 and highest 158.   b12 def- taking b12 supplement. Has neuropathy to his hands. No fall reported.     Past Medical History:  Diagnosis Date  . BPH (benign prostatic hyperplasia) 10/14/2014  . Cervicalgia 01/04/2016  . Contusion of left shoulder 05/27/14  . DM type 2 (diabetes mellitus, type 2) ([place])   . High blood pressure   . History of basal cell cancer 11/21/2005   Right temple  . History of  colonic polyps 10/14/2003  . Hyperlipidemia   . Left knee pain 05/27/14  . SAH (subarachnoid hemorrhage) ([place]) 2009   TBI  . TIA (transient ischemic attack) 11/06/2005   Left eye pain. Slurred speech. Diaphoresis. No residual effects.    Past Surgical History:  Procedure Laterality Date  . Achilles tendon repair Left 1998   ruptured tendon  . COLONOSCOPY  1992   Polyp removed  . COLONOSCOPY  1998   normal  . ORIF FEMUR FRACTURE Right 1929  . TONSILLECTOMY      reports that he quit smoking about 45 years ago. He has never used smokeless tobacco. He reports that he drinks about 0.6 - 1.2 oz of alcohol per week . He reports that he does not use drugs. Social History   Social History  . Marital status: Married    Spouse name: N/A  . Number of children: N/A  . Years of education: N/A   Occupational History  . retired Hotel manager     Social History Main Topics  . Smoking status: Former Smoker    Quit date: 06/26/1971  . Smokeless tobacco: Never Used     Comment: 2 1/2 packs a day  . Alcohol use 0.6 - 1.2 oz/week    1 - 2 Glasses of wine per week     Comment: Wine nightly  . Drug use: No  . Sexual activity: Not on file   Other Topics Concern  .  Not on file   Social History Narrative   Patient lives at [facility] since Dec 2015   Caffeine- Coffee   Married- Yes, 1951   [employer] 1943-46   House- Apartment with 2 people   Pets- No   Current/past profession- Press photographer   Exercise- Yes, walking   Living will- Yes   DNR- Yes   POA/HPOA- Yes          Functional Status Survey:    Family History  Problem Relation Age of Onset  . Heart attack Mother   . Heart disease Mother   . Heart disease Father     Health Maintenance  Topic Date Due  . FOOT EXAM  03/21/1934  . TETANUS/TDAP  03/22/1943  . PNA vac Low Risk Adult (2 of 2 - PCV13) 03/12/2012  . INFLUENZA VACCINE  01/24/2017  . OPHTHALMOLOGY EXAM  07/07/2017  . HEMOGLOBIN A1C  09/09/2017    No Known  Allergies  Outpatient Encounter Prescriptions as of 03/21/2017  Medication Sig  . aspirin 325 MG EC tablet Take 325 mg by mouth. Take one every other day  . Blood Glucose Monitoring Suppl (ONE TOUCH ULTRA SYSTEM KIT) w/Device KIT Use to test blood sugar twice daily. Dx E11.9  . Cholecalciferol 1000 UNITS tablet Take 1,000 Units by mouth daily.  Marland Kitchen glucose blood test strip One Touch Ultra Test Strip Sig:Use to test blood sugar twice daily. Dx E11.9  . Lancets (UNILET COMFORTOUCH LANCET) MISC Use to test blood sugar twice daily. Dx: E11.9  . metFORMIN (GLUCOPHAGE) 1000 MG tablet TAKE 1 TABLET BY MOUTH EVERY MORNING TO CONTROL DIABETES  . olmesartan (BENICAR) 20 MG tablet TAKE ONE TABLET BY MOUTH DAILY  . simvastatin (ZOCOR) 20 MG tablet Take one tablet by mouth once daily for cholesterol  . vitamin B-12 (CYANOCOBALAMIN) 1000 MCG tablet Take 1,000 mcg by mouth daily.  . [DISCONTINUED] naproxen sodium (ALEVE) 220 MG tablet One up to twice daily to help arthritis  . [DISCONTINUED] Salicylic Acid-Urea (EXFOLIATING MOISTURIZER EX) Apply topically. Kamer cream   No facility-administered encounter medications on file as of 03/21/2017.     Review of Systems  Constitutional: Negative for appetite change, fever and unexpected weight change.  HENT: Positive for congestion and postnasal drip. Negative for hearing loss, rhinorrhea, sore throat and trouble swallowing.   Respiratory: Negative for cough, chest tightness, shortness of breath and wheezing.   Cardiovascular: Negative for chest pain, palpitations and leg swelling.  Gastrointestinal: Negative for abdominal pain, blood in stool, constipation, nausea and vomiting.  Genitourinary: Positive for frequency. Negative for dysuria and hematuria.       Gets up 2 times at night to urinate  Musculoskeletal: Positive for arthralgias. Negative for back pain and gait problem.  Skin:       History of basal cell carcinoma, followed by dermatology Has history of  keratosis and is s/p cryotherapy  Neurological: Positive for numbness. Negative for tremors, seizures, syncope, weakness and headaches.       Occasional dizziness with sudden position change. Has history of TIA.   Hematological: Bruises/bleeds easily.  Psychiatric/Behavioral: Negative for behavioral problems, confusion, hallucinations and sleep disturbance.    Vitals:   03/21/17 0915  BP: 118/60  Pulse: 86  Resp: 18  Temp: (!) 97.3 F (36.3 C)  TempSrc: Oral  SpO2: 97%  Weight: 152 lb 9.6 oz (69.2 kg)  Height: 5' 8" (1.727 m)   Body mass index is 23.2 kg/m.   Wt Readings from Last  3 Encounters:  03/21/17 152 lb 9.6 oz (69.2 kg)  09/12/16 153 lb (69.4 kg)  05/16/16 153 lb (69.4 kg)   Physical Exam  Constitutional: He is oriented to person, place, and time. He appears well-developed and well-nourished. No distress.  HENT:  Head: Normocephalic and atraumatic.  Mouth/Throat: Oropharynx is clear and moist. No oropharyngeal exudate.  Eyes: Pupils are equal, round, and reactive to light. Conjunctivae and EOM are normal.  Neck: Normal range of motion. Neck supple. No thyromegaly present.  Cardiovascular:  Murmur heard. Normal s1, s2, systolic murmur present  Pulmonary/Chest: Effort normal and breath sounds normal. No respiratory distress. He has no wheezes. He has no rales. He exhibits no tenderness.  Abdominal: Soft. Bowel sounds are normal. He exhibits no distension. There is no tenderness. There is no rebound and no guarding.  Musculoskeletal: Normal range of motion. He exhibits edema.  Trace leg edema, arthritis changes to fingers  Lymphadenopathy:    He has no cervical adenopathy.  Neurological: He is alert and oriented to person, place, and time.  Skin: Skin is warm and dry. No rash noted. He is not diaphoretic. No erythema. No pallor.  Psychiatric: He has a normal mood and affect. His behavior is normal.   Diabetic Foot Exam - Simple   Simple Foot Form Visual  Inspection No deformities, no ulcerations, no other skin breakdown bilaterally:  Yes Sensation Testing Intact to touch and monofilament testing bilaterally:  Yes Pulse Check Posterior Tibialis and Dorsalis pulse intact bilaterally:  Yes Comments    Depression screen PHQ 2/9 03/21/2017  Decreased Interest 0  Down, Depressed, Hopeless 0  PHQ - 2 Score 0   Labs reviewed: Basic Metabolic Panel:  Recent Labs  05/08/16 0750 09/04/16 0800 03/12/17 0810  NA 138 139 137  K 4.4 4.4 4.3  CL 104 105 103  CO2 _0 GLUCOSE 174* 146* 157*  BUN _1 CREATININE 1.34* 1.34* 1.18*  CALCIUM 9.3 9.1 9.4   Liver Function Tests:  Recent Labs  05/08/16 0750 09/04/16 0800 03/12/17 0810  AST _2 ALT _3 ALKPHOS 59 54  --   BILITOT 0.9 0.8 0.8  PROT 6.5 6.4 6.2  ALBUMIN 3.9 3.9  --    No results for input(s): LIPASE, AMYLASE in the last 8760 hours. No results for input(s): AMMONIA in the last 8760 hours. CBC: No results for input(s): WBC, NEUTROABS, HGB, HCT, MCV, PLT in the last 8760 hours. Cardiac Enzymes: No results for input(s): CKTOTAL, CKMB, CKMBINDEX, TROPONINI in the last 8760 hours. BNP: Invalid input(s): POCBNP Lab Results  Component Value Date   HGBA1C 7.8 (H) 03/12/2017    Lipid Panel:  Recent Labs  05/08/16 0750 09/04/16 0800 03/12/17 0810  CHOL 211* 205* 144  HDL 57 55 63  LDLCALC 131* 130*  --   TRIG 113 100 99  CHOLHDL 3.7 3.7 2.3   Lab Results  Component Value Date   HGBA1C 7.8 (H) 03/12/2017    Procedures since last visit: No results found.  Assessment/Plan  1. Essential hypertension Continue benicar. Lab reviewed.  - BMP with eGFR; Future  2. Controlled type 2 diabetes mellitus with stage 3 chronic kidney disease, without long-term current use of insulin (HCC) Lab Results  Component Value Date   HGBA1C 7.8 (H) 03/12/2017   Continue his metformin with statin and aspirin. Continue ARB. Normal foot exam.  -  Hemoglobin A1c; Future - CBC (no diff); Future -  BMP with eGFR; Future  3. CKD (chronic kidney disease) stage 3, GFR 30-59 ml/min Avoid NSAIDs. Monitor renal function - CBC (no diff); Future - BMP with eGFR; Future - DG Bone Density; Future  4. Hyperlipidemia LDL goal <70 LDL at goal. Decrease simvastatin to 10 mg daily.   5. History of TIA (transient ischemic attack) Continue aspirin , advised to take EC Aspirin and to take it every day instead of qod. Continue statin with changes as above.   6. Vitamin D deficiency Continue vitamin d supplement - DG Bone Density; Future  7. Dry eyes Continue tear drops, advised to use it every 2 hour as needed.    Labs/tests ordered:  As above  Next appointment: 3-4 month  Communication: reviewed care plan with patient     Blanchie Serve, MD Internal Medicine [place], [place] [address] Cell Phone (Monday-Friday 8 am - 5 pm): [phone] On Call: [phone] and follow prompts after 5 pm and on weekends Office Phone: [phone] Office Fax: [phone]

## 2017-03-22 ENCOUNTER — Encounter: Payer: Self-pay | Admitting: Internal Medicine

## 2017-04-04 DIAGNOSIS — C44612 Basal cell carcinoma of skin of right upper limb, including shoulder: Secondary | ICD-10-CM | POA: Diagnosis not present

## 2017-04-04 DIAGNOSIS — L570 Actinic keratosis: Secondary | ICD-10-CM | POA: Diagnosis not present

## 2017-04-04 DIAGNOSIS — L814 Other melanin hyperpigmentation: Secondary | ICD-10-CM | POA: Diagnosis not present

## 2017-04-06 DIAGNOSIS — L84 Corns and callosities: Secondary | ICD-10-CM | POA: Diagnosis not present

## 2017-04-06 DIAGNOSIS — M79672 Pain in left foot: Secondary | ICD-10-CM | POA: Diagnosis not present

## 2017-04-06 DIAGNOSIS — M79671 Pain in right foot: Secondary | ICD-10-CM | POA: Diagnosis not present

## 2017-04-06 DIAGNOSIS — B351 Tinea unguium: Secondary | ICD-10-CM | POA: Diagnosis not present

## 2017-04-27 ENCOUNTER — Non-Acute Institutional Stay: Payer: Medicare Other

## 2017-04-27 VITALS — BP 138/56 | HR 76 | Temp 98.0°F | Ht 68.0 in | Wt 153.0 lb

## 2017-04-27 DIAGNOSIS — Z0000 Encounter for general adult medical examination without abnormal findings: Secondary | ICD-10-CM

## 2017-04-27 MED ORDER — PNEUMOCOCCAL 13-VAL CONJ VACC IM SUSP: 0.5000 mL | INTRAMUSCULAR | 0 refills | Status: AC

## 2017-04-27 MED ORDER — TETANUS-DIPHTH-ACELL PERTUSSIS 5-2.5-18.5 LF-MCG/0.5 IM SUSP: 0.5000 mL | Freq: Once | INTRAMUSCULAR | 0 refills | Status: AC

## 2017-04-27 NOTE — Addendum Note (Signed)
Addended by: Rich Reining E on: 04/27/2017 02:28 PM   Modules accepted: Orders

## 2017-04-27 NOTE — Patient Instructions (Signed)
[name] , Thank you for taking time to come for your Medicare Wellness Visit. I appreciate your ongoing commitment to your health goals. Please review the following plan we discussed and let me know if I can assist you in the future.   Screening recommendations/referrals: Colonoscopy excluded, you are over age 81 Recommended yearly ophthalmology/optometry visit for glaucoma screening and checkup Recommended yearly dental visit for hygiene and checkup  Vaccinations: Influenza vaccine up to date, due 2019 fall season Pneumococcal vaccine 13 due, prescription sent to pharmacy Tdap vaccine due, prescription sent to pharmacy Shingles vaccine due, declined    Advanced directives: In Chart  Conditions/risks identified: None  Next appointment: Dr. Bubba Camp 06/27/2016 @ 9am  Preventive Care 31 Years and Older, Male Preventive care refers to lifestyle choices and visits with your health care provider that can promote health and wellness. What does preventive care include?  A yearly physical exam. This is also called an annual well check.  Dental exams once or twice a year.  Routine eye exams. Ask your health care provider how often you should have your eyes checked.  Personal lifestyle choices, including:  Daily care of your teeth and gums.  Regular physical activity.  Eating a healthy diet.  Avoiding tobacco and drug use.  Limiting alcohol use.  Practicing safe sex.  Taking low doses of aspirin every day.  Taking vitamin and mineral supplements as recommended by your health care provider. What happens during an annual well check? The services and screenings done by your health care provider during your annual well check will depend on your age, overall health, lifestyle risk factors, and family history of disease. Counseling  Your health care provider may ask you questions about your:  Alcohol use.  Tobacco use.  Drug use.  Emotional well-being.  Home and  relationship well-being.  Sexual activity.  Eating habits.  History of falls.  Memory and ability to understand (cognition).  Work and work Statistician. Screening  You may have the following tests or measurements:  Height, weight, and BMI.  Blood pressure.  Lipid and cholesterol levels. These may be checked every 5 years, or more frequently if you are over 68 years old.  Skin check.  Lung cancer screening. You may have this screening every year starting at age 32 if you have a 30-pack-year history of smoking and currently smoke or have quit within the past 15 years.  Fecal occult blood test (FOBT) of the stool. You may have this test every year starting at age 50.  Flexible sigmoidoscopy or colonoscopy. You may have a sigmoidoscopy every 5 years or a colonoscopy every 10 years starting at age 60.  Prostate cancer screening. Recommendations will vary depending on your family history and other risks.  Hepatitis C blood test.  Hepatitis B blood test.  Sexually transmitted disease (STD) testing.  Diabetes screening. This is done by checking your blood sugar (glucose) after you have not eaten for a while (fasting). You may have this done every 1-3 years.  Abdominal aortic aneurysm (AAA) screening. You may need this if you are a current or former smoker.  Osteoporosis. You may be screened starting at age 70 if you are at high risk. Talk with your health care provider about your test results, treatment options, and if necessary, the need for more tests. Vaccines  Your health care provider may recommend certain vaccines, such as:  Influenza vaccine. This is recommended every year.  Tetanus, diphtheria, and acellular pertussis (Tdap, Td) vaccine. You  may need a Td booster every 10 years.  Zoster vaccine. You may need this after age 27.  Pneumococcal 13-valent conjugate (PCV13) vaccine. One dose is recommended after age 30.  Pneumococcal polysaccharide (PPSV23) vaccine. One  dose is recommended after age 65. Talk to your health care provider about which screenings and vaccines you need and how often you need them. This information is not intended to replace advice given to you by your health care provider. Make sure you discuss any questions you have with your health care provider. Document Released: 07/09/2015 Document Revised: 03/01/2016 Document Reviewed: 04/13/2015 Elsevier Interactive Patient Education  2017 [place] Prevention in the Home Falls can cause injuries. They can happen to people of all ages. There are many things you can do to make your home safe and to help prevent falls. What can I do on the outside of my home?  Regularly fix the edges of walkways and driveways and fix any cracks.  Remove anything that might make you trip as you walk through a door, such as a raised step or threshold.  Trim any bushes or trees on the path to your home.  Use bright outdoor lighting.  Clear any walking paths of anything that might make someone trip, such as rocks or tools.  Regularly check to see if handrails are loose or broken. Make sure that both sides of any steps have handrails.  Any raised decks and porches should have guardrails on the edges.  Have any leaves, snow, or ice cleared regularly.  Use sand or salt on walking paths during winter.  Clean up any spills in your garage right away. This includes oil or grease spills. What can I do in the bathroom?  Use night lights.  Install grab bars by the toilet and in the tub and shower. Do not use towel bars as grab bars.  Use non-skid mats or decals in the tub or shower.  If you need to sit down in the shower, use a plastic, non-slip stool.  Keep the floor dry. Clean up any water that spills on the floor as soon as it happens.  Remove soap buildup in the tub or shower regularly.  Attach bath mats securely with double-sided non-slip rug tape.  Do not have throw rugs and other  things on the floor that can make you trip. What can I do in the bedroom?  Use night lights.  Make sure that you have a light by your bed that is easy to reach.  Do not use any sheets or blankets that are too big for your bed. They should not hang down onto the floor.  Have a firm chair that has side arms. You can use this for support while you get dressed.  Do not have throw rugs and other things on the floor that can make you trip. What can I do in the kitchen?  Clean up any spills right away.  Avoid walking on wet floors.  Keep items that you use a lot in easy-to-reach places.  If you need to reach something above you, use a strong step stool that has a grab bar.  Keep electrical cords out of the way.  Do not use floor polish or wax that makes floors slippery. If you must use wax, use non-skid floor wax.  Do not have throw rugs and other things on the floor that can make you trip. What can I do with my stairs?  Do not leave any items  on the stairs.  Make sure that there are handrails on both sides of the stairs and use them. Fix handrails that are broken or loose. Make sure that handrails are as long as the stairways.  Check any carpeting to make sure that it is firmly attached to the stairs. Fix any carpet that is loose or worn.  Avoid having throw rugs at the top or bottom of the stairs. If you do have throw rugs, attach them to the floor with carpet tape.  Make sure that you have a light switch at the top of the stairs and the bottom of the stairs. If you do not have them, ask someone to add them for you. What else can I do to help prevent falls?  Wear shoes that:  Do not have high heels.  Have rubber bottoms.  Are comfortable and fit you well.  Are closed at the toe. Do not wear sandals.  If you use a stepladder:  Make sure that it is fully opened. Do not climb a closed stepladder.  Make sure that both sides of the stepladder are locked into place.  Ask  someone to hold it for you, if possible.  Clearly mark and make sure that you can see:  Any grab bars or handrails.  First and last steps.  Where the edge of each step is.  Use tools that help you move around (mobility aids) if they are needed. These include:  Canes.  Walkers.  Scooters.  Crutches.  Turn on the lights when you go into a dark area. Replace any light bulbs as soon as they burn out.  Set up your furniture so you have a clear path. Avoid moving your furniture around.  If any of your floors are uneven, fix them.  If there are any pets around you, be aware of where they are.  Review your medicines with your doctor. Some medicines can make you feel dizzy. This can increase your chance of falling. Ask your doctor what other things that you can do to help prevent falls. This information is not intended to replace advice given to you by your health care provider. Make sure you discuss any questions you have with your health care provider. Document Released: 04/08/2009 Document Revised: 11/18/2015 Document Reviewed: 07/17/2014 Elsevier Interactive Patient Education  2017 [employer].

## 2017-04-27 NOTE — Progress Notes (Signed)
Subjective:   Nathan Cook is a 83 y.o. male who presents for an Initial Medicare Annual Wellness Visit at [facility]   Objective:    Today's Vitals   04/27/17 1409  BP: (!) 138/56  Pulse: 76  Temp: 98 F (36.7 C)  TempSrc: Oral  SpO2: 96%  Weight: 153 lb (69.4 kg)  Height: _0  (1.727 m)   Body mass index is 23.26 kg/m.  Current Medications (verified) Outpatient Encounter Prescriptions as of 04/27/2017  Medication Sig  . aspirin 325 MG EC tablet Take 325 mg by mouth. Take one every other day  . Blood Glucose Monitoring Suppl (ONE TOUCH ULTRA SYSTEM KIT) w/Device KIT Use to test blood sugar twice daily. Dx E11.9  . calcium carbonate 1250 MG capsule Take 600 mg by mouth daily.  . Cholecalciferol 1000 UNITS tablet Take 1,000 Units by mouth daily.  Marland Kitchen glucose blood test strip One Touch Ultra Test Strip Sig:Use to test blood sugar twice daily. Dx E11.9  . Lancets (UNILET COMFORTOUCH LANCET) MISC Use to test blood sugar twice daily. Dx: E11.9  . metFORMIN (GLUCOPHAGE) 1000 MG tablet TAKE 1 TABLET BY MOUTH EVERY MORNING TO CONTROL DIABETES  . olmesartan (BENICAR) 20 MG tablet TAKE ONE TABLET BY MOUTH DAILY  . simvastatin (ZOCOR) 10 MG tablet Take 1 tablet (10 mg total) by mouth at bedtime.  . vitamin B-12 (CYANOCOBALAMIN) 1000 MCG tablet Take 1,000 mcg by mouth daily.   No facility-administered encounter medications on file as of 04/27/2017.     Allergies (verified) Patient has no known allergies.   History: Past Medical History:  Diagnosis Date  . BPH (benign prostatic hyperplasia) 10/14/2014  . Cervicalgia 01/04/2016  . Contusion of left shoulder 05/27/14  . DM type 2 (diabetes mellitus, type 2) ([place])   . High blood pressure   . History of basal cell cancer 11/21/2005   Right temple  . History of colonic polyps 10/14/2003  . Hyperlipidemia   . Left knee pain 05/27/14  . 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 TIA  (transient ischemic attack) 11/06/2005   Left eye pain. Slurred speech. Diaphoresis. No residual effects.    Past Surgical History:  Procedure Laterality Date  . Achilles tendon repair Left 1998   ruptured tendon  . COLONOSCOPY  1992   Polyp removed  . COLONOSCOPY  1998   normal  . ORIF FEMUR FRACTURE Right 1929  . TONSILLECTOMY     Family History  Problem Relation Age of Onset  . Heart attack Mother   . Heart disease Mother   . Heart disease Father    Social History   Occupational History  . retired Hotel manager     Social History Main Topics  . Smoking status: Former Smoker    Quit date: 06/26/1971  . Smokeless tobacco: Never Used     Comment: 2 1/2 packs a day, until 1972  . Alcohol use 0.6 - 1.2 oz/week    1 - 2 Glasses of wine per week     Comment: Wine nightly  . Drug use: No  . Sexual activity: Not on file   Tobacco Counseling Counseling given: Not Answered   Activities of Daily Living In your present state of health, do you have any difficulty performing the following activities: 04/27/2017  Hearing? N  Vision? N  Difficulty concentrating or making decisions? N  Walking or climbing stairs? N  Dressing or bathing? N  Doing errands,  shopping? N  Preparing Food and eating ? N  Using the Toilet? N  In the past six months, have you accidently leaked urine? N  Do you have problems with loss of bowel control? N  Managing your Medications? N  Managing your Finances? N  Housekeeping or managing your Housekeeping? N  Some recent data might be hidden    Immunizations and Health Maintenance Immunization History  Administered Date(s) Administered  . DTaP 12/31/2012  . Influenza, High Dose Seasonal PF 04/04/2017  . Influenza-Unspecified 04/10/2014, 03/25/2015, 04/06/2016  . PPD Test 04/29/2014  . Pneumococcal-Unspecified 03/13/2011   Health Maintenance Due  Topic Date Due  . FOOT EXAM  03/21/1934  . TETANUS/TDAP  03/22/1943  . PNA vac Low Risk Adult (2 of 2  - PCV13) 03/12/2012    Patient Care Team: Blanchie Serve, MD as PCP - General (Internal Medicine)  Indicate any recent Medical Services you may have received from other than [facility] providers in the past year (date may be approximate).    Assessment:   This is a routine wellness examination for Nathan Cook.    Hearing/Vision screen No exam data present  Dietary issues and exercise activities discussed: Current Exercise Habits: Home exercise routine, Type of exercise: walking;Other - see comments (biking), Time (Minutes): 30, Frequency (Times/Week): 6, Weekly Exercise (Minutes/Week): 180, Intensity: Mild, Exercise limited by: None identified  Goals    . Maintain Lifestyle          Starting today pt will maintain lifestyle.       Depression Screen PHQ 2/9 Scores 04/27/2017 03/21/2017 09/07/2015 09/08/2014  PHQ - 2 Score 0 0 0 0  Exception Documentation - - - Patient refusal    Fall Risk Fall Risk  04/27/2017 03/21/2017 09/07/2015 09/08/2014  Falls in the past year? No No No Yes  Number falls in past yr: - - - 1  Injury with Fall? - - - No  Risk for fall due to : - Medication side effect - -    Cognitive Function: MMSE - Mini Mental State Exam 04/27/2017  Orientation to time 5  Orientation to Place 5  Registration 3  Attention/ Calculation 5  Recall 2  Language- name 2 objects 2  Language- repeat 1  Language- follow 3 step command 3  Language- read & follow direction 1  Write a sentence 1  Copy design 1  Total score 29        Screening Tests Health Maintenance  Topic Date Due  . FOOT EXAM  03/21/1934  . TETANUS/TDAP  03/22/1943  . PNA vac Low Risk Adult (2 of 2 - PCV13) 03/12/2012  . OPHTHALMOLOGY EXAM  07/07/2017  . HEMOGLOBIN A1C  09/09/2017  . INFLUENZA VACCINE  Completed        Plan:    I have personally reviewed and addressed the Medicare Annual Wellness questionnaire and have noted the following in the patient's chart:  A. Medical and social history B. Use  of alcohol, tobacco or illicit drugs  C. Current medications and supplements D. Functional ability and status E.  Nutritional status F.  Physical activity G. Advance directives H. List of other physicians I.  Hospitalizations, surgeries, and ER visits in previous 12 months J.  [place] to include hearing, vision, cognitive, depression L. Referrals and appointments - none  In addition, I have reviewed and discussed with patient certain preventive protocols, quality metrics, and best practice recommendations. A written personalized care plan for preventive services as well as general  preventive health recommendations were provided to patient.  See attached scanned questionnaire for additional information.   Signed,   Rich Reining, RN Nurse Health Advisor   Quick Notes   Health Maintenance: PNA 13 and TDAP prescriptions sent to pharmacy. Declined Shingrix.     Abnormal Screen: MMSE 29/30, passed clock drawing     Patient Concerns: None     Nurse Concerns: None

## 2017-04-28 DIAGNOSIS — Z23 Encounter for immunization: Secondary | ICD-10-CM | POA: Diagnosis not present

## 2017-05-01 ENCOUNTER — Other Ambulatory Visit: Payer: Medicare Other

## 2017-05-05 ENCOUNTER — Other Ambulatory Visit: Payer: Self-pay | Admitting: Internal Medicine

## 2017-05-07 ENCOUNTER — Other Ambulatory Visit: Payer: Self-pay | Admitting: Internal Medicine

## 2017-05-08 ENCOUNTER — Encounter: Payer: Medicare Other | Admitting: Internal Medicine

## 2017-05-09 DIAGNOSIS — L814 Other melanin hyperpigmentation: Secondary | ICD-10-CM | POA: Diagnosis not present

## 2017-05-09 DIAGNOSIS — C44612 Basal cell carcinoma of skin of right upper limb, including shoulder: Secondary | ICD-10-CM | POA: Diagnosis not present

## 2017-05-21 ENCOUNTER — Other Ambulatory Visit: Payer: Self-pay | Admitting: Internal Medicine

## 2017-06-15 DIAGNOSIS — I10 Essential (primary) hypertension: Secondary | ICD-10-CM | POA: Diagnosis not present

## 2017-06-15 DIAGNOSIS — E1122 Type 2 diabetes mellitus with diabetic chronic kidney disease: Secondary | ICD-10-CM | POA: Diagnosis not present

## 2017-06-15 DIAGNOSIS — M79671 Pain in right foot: Secondary | ICD-10-CM | POA: Diagnosis not present

## 2017-06-15 DIAGNOSIS — M79672 Pain in left foot: Secondary | ICD-10-CM | POA: Diagnosis not present

## 2017-06-15 DIAGNOSIS — B351 Tinea unguium: Secondary | ICD-10-CM | POA: Diagnosis not present

## 2017-06-15 DIAGNOSIS — L84 Corns and callosities: Secondary | ICD-10-CM | POA: Diagnosis not present

## 2017-06-15 DIAGNOSIS — N183 Chronic kidney disease, stage 3 (moderate): Secondary | ICD-10-CM | POA: Diagnosis not present

## 2017-06-19 LAB — BASIC METABOLIC PANEL WITH GFR
BUN / CREAT RATIO: 13 (calc) (ref 6–22)
BUN: 18 mg/dL (ref 7–25)
CALCIUM: 9.2 mg/dL (ref 8.6–10.3)
CO2: 27 mmol/L (ref 20–32)
CREATININE: 1.35 mg/dL — AB (ref 0.70–1.11)
Chloride: 104 mmol/L (ref 98–110)
GFR, Est African American: 52 mL/min/{1.73_m2} — ABNORMAL LOW (ref >=60)
GFR, Est Non African American: 45 mL/min/{1.73_m2} — ABNORMAL LOW (ref >=60)
GLUCOSE: 161 mg/dL — AB (ref 65–99)
Potassium: 4.6 mmol/L (ref 3.5–5.3)
Sodium: 138 mmol/L (ref 135–146)

## 2017-06-19 LAB — CBC
HEMATOCRIT: 38.2 % — AB (ref 38.5–50.0)
HEMOGLOBIN: 13.1 g/dL — AB (ref 13.2–17.1)
MCH: 34.1 pg — ABNORMAL HIGH (ref 27.0–33.0)
MCHC: 34.3 g/dL (ref 32.0–36.0)
MCV: 99.5 fL (ref 80.0–100.0)
MPV: 9.8 fL (ref 7.5–12.5)
Platelets: 320 10*3/uL (ref 140–400)
RBC: 3.84 10*6/uL — AB (ref 4.20–5.80)
RDW: 11.7 % (ref 11.0–15.0)
WBC: 6 10*3/uL (ref 3.8–10.8)

## 2017-06-19 LAB — HEMOGLOBIN A1C
EAG (MMOL/L): 9.5 (calc)
HEMOGLOBIN A1C: 7.6 %{Hb} — AB (ref <5.7)
Mean Plasma Glucose: 171 (calc)

## 2017-06-27 ENCOUNTER — Non-Acute Institutional Stay: Payer: Medicare Other | Admitting: Internal Medicine

## 2017-06-27 ENCOUNTER — Encounter: Payer: Self-pay | Admitting: Internal Medicine

## 2017-06-27 VITALS — BP 132/62 | HR 78 | Temp 97.6°F | Resp 16 | Ht 68.0 in | Wt 153.0 lb

## 2017-06-27 DIAGNOSIS — E1122 Type 2 diabetes mellitus with diabetic chronic kidney disease: Secondary | ICD-10-CM

## 2017-06-27 DIAGNOSIS — I10 Essential (primary) hypertension: Secondary | ICD-10-CM

## 2017-06-27 DIAGNOSIS — E538 Deficiency of other specified B group vitamins: Secondary | ICD-10-CM | POA: Diagnosis not present

## 2017-06-27 DIAGNOSIS — N183 Chronic kidney disease, stage 3 unspecified: Secondary | ICD-10-CM

## 2017-06-27 DIAGNOSIS — M159 Polyosteoarthritis, unspecified: Secondary | ICD-10-CM | POA: Insufficient documentation

## 2017-06-27 DIAGNOSIS — Z8673 Personal history of transient ischemic attack (TIA), and cerebral infarction without residual deficits: Secondary | ICD-10-CM

## 2017-06-27 DIAGNOSIS — E785 Hyperlipidemia, unspecified: Secondary | ICD-10-CM | POA: Diagnosis not present

## 2017-06-27 DIAGNOSIS — R1310 Dysphagia, unspecified: Secondary | ICD-10-CM | POA: Diagnosis not present

## 2017-06-27 NOTE — Progress Notes (Signed)
[facility]  Provider: Blanchie Serve MD   Location:  [place] of Service:  Clinic (12)  PCP: Blanchie Serve, MD Patient Care Team: Blanchie Serve, MD as PCP - General (Internal Medicine)  Extended Emergency Contact Information Primary Emergency Contact: [place] Address: [address]          [place] [address] [name] Phone: [phone] Relation: Daughter  Goals of Care: Advanced Directive information Advanced Directives 04/27/2017  Does Patient Have a Medical Advance Directive? Yes  Type of Paramedic of [place];Living will  Does patient want to make changes to medical advance directive? No - Patient declined  Copy of [place] in Chart? Yes      Chief Complaint  Patient presents with  . Medical Management of Chronic Issues    4 month follow up. No concerns at this time.   . Medication Refill    No refills needed at this time  . Results    Discuss labs    HPI: Patient is a 84 y.o. male seen today for routine visit.   Dry eyes- using OTC tear drops, helps some. Has corrective glasses.  Hypertension- taking benicar 20 mg daily. Denies headache, chest pain, dyspnea or abdominal pain.   Type 2 diabetes mellitus with neuropathy- currently on metformin 1000 mg daily. cbg readings 95-132.  Callus- on his feet. Removed one 2 weeks back. Sees podiatry.  Hyperlipidemia- takes simvastatin, tolerating well.  b12 def- on b12 supplement. Has neuropathy of hands  OA- taking calcium and vitamin d supplement.   Past Medical History:  Diagnosis Date  . BPH (benign prostatic hyperplasia) 10/14/2014  . Cervicalgia 01/04/2016  . Contusion of left shoulder 05/27/14  . DM type 2 (diabetes mellitus, type 2) ([place])   . High blood pressure   . History of basal cell cancer 11/21/2005   Right temple  . History of colonic polyps 10/14/2003  . Hyperlipidemia   . Left  knee pain 05/27/14  . SAH (subarachnoid hemorrhage) ([place]) 2009   TBI  . TIA (transient ischemic attack) 11/06/2005   Left eye pain. Slurred speech. Diaphoresis. No residual effects.    Past Surgical History:  Procedure Laterality Date  . Achilles tendon repair Left 1998   ruptured tendon  . COLONOSCOPY  1992   Polyp removed  . COLONOSCOPY  1998   normal  . ORIF FEMUR FRACTURE Right 1929  . TONSILLECTOMY      reports that he quit smoking about 46 years ago. he has never used smokeless tobacco. He reports that he drinks about 0.6 - 1.2 oz of alcohol per week. He reports that he does not use drugs. Social History   Socioeconomic History  . Marital status: Married    Spouse name: Not on file  . Number of children: Not on file  . Years of education: Not on file  . Highest education level: Not on file  Social Needs  . Financial resource strain: Not on file  . Food insecurity - worry: Not on file  . Food insecurity - inability: Not on file  . Transportation needs - medical: Not on file  . Transportation needs - non-medical: Not on file  Occupational History  . Occupation: retired Hotel manager   Tobacco Use  . Smoking status: Former Smoker    Last attempt to quit: 06/26/1971    Years since quitting: 46.0  . Smokeless tobacco: Never Used  .  Tobacco comment: 2 1/2 packs a day, until 1972  Substance and Sexual Activity  . Alcohol use: Yes    Alcohol/week: 0.6 - 1.2 oz    Types: 1 - 2 Glasses of wine per week    Comment: Wine nightly  . Drug use: No  . Sexual activity: Not on file  Other Topics Concern  . Not on file  Social History Narrative   Patient [facility] at [facility] since Dec 2015   Caffeine- Coffee   Married- Yes, 1951   [employer] 1943-46   House- Apartment with 2 people   Pets- No   Current/past profession- Press photographer   Exercise- Yes, walking   Living will- Yes   DNR- Yes   POA/HPOA- Yes       Functional Status Survey:    Family History  Problem Relation Age  of Onset  . Heart attack Mother   . Heart disease Mother   . Heart disease Father     Health Maintenance  Topic Date Due  . FOOT EXAM  03/21/1934  . OPHTHALMOLOGY EXAM  07/07/2017  . HEMOGLOBIN A1C  12/14/2017  . TETANUS/TDAP  04/29/2027  . INFLUENZA VACCINE  Completed  . PNA vac Low Risk Adult  Completed    No Known Allergies  Outpatient Encounter Medications as of 06/27/2017  Medication Sig  . aspirin 325 MG EC tablet Take 325 mg by mouth. Take one every other day  . Blood Glucose Monitoring Suppl (ONE TOUCH ULTRA SYSTEM KIT) w/Device KIT Use to test blood sugar twice daily. Dx E11.9  . calcium carbonate 1250 MG capsule Take 600 mg by mouth daily.  . Cholecalciferol 1000 UNITS tablet Take 1,000 Units by mouth daily.  [place] Kitchen glucose blood test strip One Touch Ultra Test Strip Sig:Use to test blood sugar twice daily. Dx E11.9  . Lancets (UNILET COMFORTOUCH LANCET) MISC Use to test blood sugar twice daily. Dx: E11.9  . metFORMIN (GLUCOPHAGE) 1000 MG tablet TAKE 1 TABLET BY MOUTH EVERY MORNING TO CONTROL DIABETES  . olmesartan (BENICAR) 20 MG tablet TAKE ONE TABLET BY MOUTH DAILY  . simvastatin (ZOCOR) 10 MG tablet Take 1 tablet (10 mg total) by mouth at bedtime.  . vitamin B-12 (CYANOCOBALAMIN) 1000 MCG tablet Take 1,000 mcg by mouth daily.   No facility-administered encounter medications on file as of 06/27/2017.     Review of Systems  Constitutional: Negative for appetite change, diaphoresis and fever.       Walking for exercise  HENT: Positive for hearing loss, rhinorrhea and trouble swallowing. Negative for congestion, ear pain, sinus pressure, sinus pain and sore throat.        Occasional trouble with solid food particles.  Respiratory: Negative for cough and shortness of breath.   Cardiovascular: Positive for leg swelling. Negative for chest pain and palpitations.       Occasional  Swelling, subsides with leg elevation  Gastrointestinal: Negative for abdominal pain, blood in  stool, constipation, diarrhea, nausea and vomiting.  Genitourinary: Negative for decreased urine volume, dysuria, flank pain, frequency, hematuria and urgency.       Wakes up 1-2 times at night to urinate  Musculoskeletal: Negative for back pain, gait problem and joint swelling.       No fall reported, has bilateral shoulder and knee pain, has history of OA  Neurological: Positive for dizziness and numbness. Negative for headaches.       With sudden change of position has dizziness, slow position change helps. Has neuropathy  to hands right > left- pins and needle sensation  Hematological: Bruises/bleeds easily.  Psychiatric/Behavioral: Negative for behavioral problems, confusion and sleep disturbance. The patient is not nervous/anxious.     Vitals:   06/27/17 0905  BP: 132/62  Pulse: 78  Resp: 16  Temp: 97.6 F (36.4 C)  TempSrc: Oral  SpO2: 95%  Weight: 153 lb (69.4 kg)  Height: '5\' 8"'$  (1.727 m)   Body mass index is 23.26 kg/m.   Wt Readings from Last 3 Encounters:  06/27/17 153 lb (69.4 kg)  04/27/17 153 lb (69.4 kg)  03/21/17 152 lb 9.6 oz (69.2 kg)   Physical Exam  Constitutional: He is oriented to person, place, and time. He appears well-developed and well-nourished. No distress.  HENT:  Head: Normocephalic and atraumatic.  Nose: Nose normal.  Mouth/Throat: Oropharynx is clear and moist.  Eyes: Conjunctivae and EOM are normal. Pupils are equal, round, and reactive to light. Right eye exhibits no discharge. Left eye exhibits no discharge.  Has corrective glasses  Neck: Normal range of motion. Neck supple.  Cardiovascular: Normal rate and regular rhythm.  Pulmonary/Chest: Breath sounds normal. He has no wheezes. He has no rales.  Abdominal: Soft. Bowel sounds are normal. There is no tenderness.  Musculoskeletal: He exhibits edema and deformity. He exhibits no tenderness.  Able to move all 4 extremities, ROM shoulder limited with OA, arthritis changes to digits, stable  gait  Lymphadenopathy:    He has no cervical adenopathy.  Neurological: He is alert and oriented to person, place, and time.  Skin: Skin is warm and dry. He is not diaphoretic.  Psychiatric: He has a normal mood and affect. His behavior is normal.    Labs reviewed: Basic Metabolic Panel: Recent Labs    09/04/16 0800 03/12/17 0810 06/15/17 0000  NA 139 137 138  K 4.4 4.3 4.6  CL 105 103 104  CO2 '26 27 27  '$ GLUCOSE 146* 157* 161*  BUN '22 16 18  '$ CREATININE 1.34* 1.18* 1.35*  CALCIUM 9.1 9.4 9.2   Liver Function Tests: Recent Labs    09/04/16 0800 03/12/17 0810  AST 18 19  ALT 14 22  ALKPHOS 54  --   BILITOT 0.8 0.8  PROT 6.4 6.2  ALBUMIN 3.9  --    No results for input(s): LIPASE, AMYLASE in the last 8760 hours. No results for input(s): AMMONIA in the last 8760 hours. CBC: Recent Labs    06/15/17 0000  WBC 6.0  HGB 13.1*  HCT 38.2*  MCV 99.5  PLT 320   Cardiac Enzymes: No results for input(s): CKTOTAL, CKMB, CKMBINDEX, TROPONINI in the last 8760 hours. BNP: Invalid input(s): POCBNP Lab Results  Component Value Date   HGBA1C 7.6 (H) 06/15/2017   No results found for: TSH No results found for: VITAMINB12 No results found for: FOLATE No results found for: IRON, TIBC, FERRITIN  Lipid Panel: Recent Labs    09/04/16 0800 03/12/17 0810  CHOL 205* 144  HDL 55 63  LDLCALC 130*  --   TRIG 100 99  CHOLHDL 3.7 2.3   Lab Results  Component Value Date   HGBA1C 7.6 (H) 06/15/2017    Procedures since last visit: No results found.  Assessment/Plan  1. Controlled type 2 diabetes mellitus with stage 3 chronic kidney disease, without long-term current use of insulin (HCC) Reviewed a1c 7.6. Continue metformin for now. Urine microalbumin negative. On ARB for renal protection. Foot exam normal today Diabetic Foot Exam - Simple   Simple  Foot Form Diabetic Foot exam was performed with the following findings:  Yes 06/27/2017 10:07 AM  Visual Inspection See  comments:  Yes Sensation Testing Intact to touch and monofilament testing bilaterally:  Yes Pulse Check Posterior Tibialis and Dorsalis pulse intact bilaterally:  Yes Comments Has callus to both feet, hammertoes present     - CBC (no diff); Future - Hemoglobin A1c; Future  2. CKD (chronic kidney disease) stage 3, GFR 30-59 ml/min (HCC) Avoid NSAIDs. Maintain hydration - CMP with eGFR; Future  3. Hyperlipidemia LDL goal <70 Continue simvastatin - Lipid Panel; Future  4. History of TIA (transient ischemic attack) C/w aspirin  5. Hypertension, essential Continue benicar for now  6. Generalized osteoarthritis Continue calcium and vitamin d, advised on tylenol as needed for pain  7. B12 deficiency Continue b12 supplement - Vitamin B12; Future  8. Dysphagia, unspecified type Small bites, water sips in between bites. SLP consult, script with order provided - Ambulatory referral to Physical Therapy   Labs/tests ordered:   Lab Orders     CMP with eGFR     Lipid Panel     CBC (no diff)     Vitamin B12     Hemoglobin A1c Next appointment: 3 months for physical with EKG, MMSE  Communication: reviewed care plan with patient and charge nurse.     Blanchie Serve, MD Internal Medicine [facility] [address] [place], [place] [address] Cell Phone (Monday-Friday 8 am - 5 pm): [phone] On Call: [phone] and follow prompts after 5 pm and on weekends Office Phone: [phone] Office Fax: [phone]

## 2017-06-27 NOTE — Patient Instructions (Signed)
  Do not take aleve or advil for pain. Take tylenol if needed for joint pain. We will keep a watch on your kidney function. Keep yourself hydrated.

## 2017-07-05 DIAGNOSIS — R1312 Dysphagia, oropharyngeal phase: Secondary | ICD-10-CM | POA: Diagnosis not present

## 2017-07-23 DIAGNOSIS — R1312 Dysphagia, oropharyngeal phase: Secondary | ICD-10-CM | POA: Diagnosis not present

## 2017-07-25 DIAGNOSIS — R1312 Dysphagia, oropharyngeal phase: Secondary | ICD-10-CM | POA: Diagnosis not present

## 2017-08-08 DIAGNOSIS — L821 Other seborrheic keratosis: Secondary | ICD-10-CM | POA: Diagnosis not present

## 2017-08-08 DIAGNOSIS — L814 Other melanin hyperpigmentation: Secondary | ICD-10-CM | POA: Diagnosis not present

## 2017-08-10 ENCOUNTER — Telehealth: Payer: Self-pay | Admitting: *Deleted

## 2017-08-10 ENCOUNTER — Other Ambulatory Visit: Payer: Self-pay

## 2017-08-10 MED ORDER — LOSARTAN POTASSIUM 50 MG PO TABS: 50.0000 mg | ORAL_TABLET | Freq: Every day | ORAL | 3 refills | Status: DC

## 2017-08-10 NOTE — Telephone Encounter (Signed)
New script for losartan has been sent the patient's pharmacy om file

## 2017-08-10 NOTE — Telephone Encounter (Signed)
Received fax from [facility] [phone] stating that patient's Olmesartan 20mg  is on "back order and need to choose a different for the time being".  Please Advise.

## 2017-08-10 NOTE — Telephone Encounter (Signed)
Spoke with the pharmacist at [facility] is she mentioned that the have different wholesalers that they receive the medication from. She stated that some manufacturers are saying mid march to mid April. Others haven't given a date.   Per Dr. Bubba Camp patient has been given a new script for Losartan 50 mg daily. 90 with 3 refills.

## 2017-08-10 NOTE — Telephone Encounter (Signed)
error 

## 2017-08-10 NOTE — Telephone Encounter (Signed)
  WITH BENICAR BEING BACK LOG, I will change this to losartan 50 mg daily. Please send script to the pharmacy for losartan 50 mg daily for now. Advise patient to notify if develops headache or dizziness.

## 2017-08-16 DIAGNOSIS — E538 Deficiency of other specified B group vitamins: Secondary | ICD-10-CM

## 2017-08-16 DIAGNOSIS — E1122 Type 2 diabetes mellitus with diabetic chronic kidney disease: Secondary | ICD-10-CM | POA: Diagnosis not present

## 2017-08-16 DIAGNOSIS — E785 Hyperlipidemia, unspecified: Secondary | ICD-10-CM

## 2017-08-16 DIAGNOSIS — N183 Chronic kidney disease, stage 3 unspecified: Secondary | ICD-10-CM

## 2017-08-17 DIAGNOSIS — B351 Tinea unguium: Secondary | ICD-10-CM | POA: Diagnosis not present

## 2017-08-17 DIAGNOSIS — L84 Corns and callosities: Secondary | ICD-10-CM | POA: Diagnosis not present

## 2017-08-17 DIAGNOSIS — M79672 Pain in left foot: Secondary | ICD-10-CM | POA: Diagnosis not present

## 2017-08-17 DIAGNOSIS — M79671 Pain in right foot: Secondary | ICD-10-CM | POA: Diagnosis not present

## 2017-08-17 LAB — CBC
HEMATOCRIT: 38.3 % — AB (ref 38.5–50.0)
Hemoglobin: 13.1 g/dL — ABNORMAL LOW (ref 13.2–17.1)
MCH: 34.3 pg — ABNORMAL HIGH (ref 27.0–33.0)
MCHC: 34.2 g/dL (ref 32.0–36.0)
MCV: 100.3 fL — ABNORMAL HIGH (ref 80.0–100.0)
MPV: 10 fL (ref 7.5–12.5)
Platelets: 339 10*3/uL (ref 140–400)
RBC: 3.82 10*6/uL — ABNORMAL LOW (ref 4.20–5.80)
RDW: 11.9 % (ref 11.0–15.0)
WBC: 6.5 10*3/uL (ref 3.8–10.8)

## 2017-08-17 LAB — COMPLETE METABOLIC PANEL WITH GFR
AG RATIO: 1.5 (calc) (ref 1.0–2.5)
ALT: 17 U/L (ref 9–46)
AST: 19 U/L (ref 10–35)
Albumin: 3.8 g/dL (ref 3.6–5.1)
Alkaline phosphatase (APISO): 54 U/L (ref 40–115)
BUN/Creatinine Ratio: 15 (calc) (ref 6–22)
BUN: 18 mg/dL (ref 7–25)
CALCIUM: 9.4 mg/dL (ref 8.6–10.3)
CO2: 27 mmol/L (ref 20–32)
CREATININE: 1.21 mg/dL — AB (ref 0.70–1.11)
Chloride: 105 mmol/L (ref 98–110)
GFR, EST NON AFRICAN AMERICAN: 51 mL/min/{1.73_m2} — AB (ref >=60)
GFR, Est African American: 59 mL/min/{1.73_m2} — ABNORMAL LOW (ref >=60)
Globulin: 2.5 g/dL (calc) (ref 1.9–3.7)
Glucose, Bld: 155 mg/dL — ABNORMAL HIGH (ref 65–99)
POTASSIUM: 4.5 mmol/L (ref 3.5–5.3)
Sodium: 139 mmol/L (ref 135–146)
TOTAL PROTEIN: 6.3 g/dL (ref 6.1–8.1)
Total Bilirubin: 0.8 mg/dL (ref 0.2–1.2)

## 2017-08-17 LAB — LIPID PANEL
Cholesterol: 164 mg/dL (ref <200)
HDL: 58 mg/dL (ref >40)
LDL Cholesterol (Calc): 87 mg/dL (calc)
NON-HDL CHOLESTEROL (CALC): 106 mg/dL (ref <130)
Total CHOL/HDL Ratio: 2.8 (calc) (ref <5.0)
Triglycerides: 93 mg/dL (ref <150)

## 2017-08-17 LAB — HEMOGLOBIN A1C
HEMOGLOBIN A1C: 7.6 %{Hb} — AB (ref <5.7)
MEAN PLASMA GLUCOSE: 171 (calc)
eAG (mmol/L): 9.5 (calc)

## 2017-08-17 LAB — VITAMIN B12: VITAMIN B 12: 735 pg/mL (ref 200–1100)

## 2017-09-06 ENCOUNTER — Other Ambulatory Visit: Payer: Self-pay

## 2017-09-06 DIAGNOSIS — N183 Chronic kidney disease, stage 3 unspecified: Secondary | ICD-10-CM

## 2017-09-06 DIAGNOSIS — E538 Deficiency of other specified B group vitamins: Secondary | ICD-10-CM

## 2017-09-06 DIAGNOSIS — E1122 Type 2 diabetes mellitus with diabetic chronic kidney disease: Secondary | ICD-10-CM

## 2017-09-06 DIAGNOSIS — E785 Hyperlipidemia, unspecified: Secondary | ICD-10-CM

## 2017-09-10 DIAGNOSIS — N183 Chronic kidney disease, stage 3 unspecified: Secondary | ICD-10-CM

## 2017-09-10 DIAGNOSIS — E785 Hyperlipidemia, unspecified: Secondary | ICD-10-CM | POA: Diagnosis not present

## 2017-09-10 DIAGNOSIS — E1122 Type 2 diabetes mellitus with diabetic chronic kidney disease: Secondary | ICD-10-CM | POA: Diagnosis not present

## 2017-09-10 DIAGNOSIS — E538 Deficiency of other specified B group vitamins: Secondary | ICD-10-CM

## 2017-09-11 LAB — LIPID PANEL
Cholesterol: 153 mg/dL (ref <200)
HDL: 51 mg/dL (ref >40)
LDL Cholesterol (Calc): 83 mg/dL (calc)
Non-HDL Cholesterol (Calc): 102 mg/dL (calc) (ref <130)
TRIGLYCERIDES: 91 mg/dL (ref <150)
Total CHOL/HDL Ratio: 3 (calc) (ref <5.0)

## 2017-09-11 LAB — CBC
HCT: 38.8 % (ref 38.5–50.0)
HEMOGLOBIN: 13.5 g/dL (ref 13.2–17.1)
MCH: 33.8 pg — AB (ref 27.0–33.0)
MCHC: 34.8 g/dL (ref 32.0–36.0)
MCV: 97.2 fL (ref 80.0–100.0)
MPV: 9.8 fL (ref 7.5–12.5)
Platelets: 384 10*3/uL (ref 140–400)
RBC: 3.99 10*6/uL — AB (ref 4.20–5.80)
RDW: 11.7 % (ref 11.0–15.0)
WBC: 6.6 10*3/uL (ref 3.8–10.8)

## 2017-09-11 LAB — COMPLETE METABOLIC PANEL WITH GFR
AG RATIO: 1.5 (calc) (ref 1.0–2.5)
ALBUMIN MSPROF: 3.9 g/dL (ref 3.6–5.1)
ALT: 21 U/L (ref 9–46)
AST: 18 U/L (ref 10–35)
Alkaline phosphatase (APISO): 60 U/L (ref 40–115)
BILIRUBIN TOTAL: 0.7 mg/dL (ref 0.2–1.2)
BUN/Creatinine Ratio: 14 (calc) (ref 6–22)
BUN: 20 mg/dL (ref 7–25)
CALCIUM: 9.4 mg/dL (ref 8.6–10.3)
CHLORIDE: 103 mmol/L (ref 98–110)
CO2: 25 mmol/L (ref 20–32)
Creat: 1.4 mg/dL — ABNORMAL HIGH (ref 0.70–1.11)
GFR, EST AFRICAN AMERICAN: 50 mL/min/{1.73_m2} — AB (ref >=60)
GFR, EST NON AFRICAN AMERICAN: 43 mL/min/{1.73_m2} — AB (ref >=60)
Globulin: 2.6 g/dL (calc) (ref 1.9–3.7)
Glucose, Bld: 130 mg/dL — ABNORMAL HIGH (ref 65–99)
POTASSIUM: 4.7 mmol/L (ref 3.5–5.3)
Sodium: 138 mmol/L (ref 135–146)
TOTAL PROTEIN: 6.5 g/dL (ref 6.1–8.1)

## 2017-09-11 LAB — VITAMIN B12: VITAMIN B 12: 1226 pg/mL — AB (ref 200–1100)

## 2017-09-11 LAB — HEMOGLOBIN A1C
Hgb A1c MFr Bld: 7.7 % of total Hgb — ABNORMAL HIGH (ref <5.7)
MEAN PLASMA GLUCOSE: 174 (calc)
eAG (mmol/L): 9.7 (calc)

## 2017-09-19 ENCOUNTER — Non-Acute Institutional Stay: Payer: Medicare Other | Admitting: Internal Medicine

## 2017-09-19 ENCOUNTER — Encounter: Payer: Self-pay | Admitting: Internal Medicine

## 2017-09-19 VITALS — BP 126/60 | HR 76 | Temp 97.8°F | Resp 16 | Ht 68.0 in | Wt 153.2 lb

## 2017-09-19 DIAGNOSIS — E785 Hyperlipidemia, unspecified: Secondary | ICD-10-CM

## 2017-09-19 DIAGNOSIS — Z8673 Personal history of transient ischemic attack (TIA), and cerebral infarction without residual deficits: Secondary | ICD-10-CM

## 2017-09-19 DIAGNOSIS — M159 Polyosteoarthritis, unspecified: Secondary | ICD-10-CM

## 2017-09-19 DIAGNOSIS — E538 Deficiency of other specified B group vitamins: Secondary | ICD-10-CM | POA: Diagnosis not present

## 2017-09-19 DIAGNOSIS — E1140 Type 2 diabetes mellitus with diabetic neuropathy, unspecified: Secondary | ICD-10-CM

## 2017-09-19 DIAGNOSIS — Z23 Encounter for immunization: Secondary | ICD-10-CM

## 2017-09-19 DIAGNOSIS — Z0000 Encounter for general adult medical examination without abnormal findings: Secondary | ICD-10-CM

## 2017-09-19 DIAGNOSIS — N183 Chronic kidney disease, stage 3 (moderate): Secondary | ICD-10-CM | POA: Diagnosis not present

## 2017-09-19 DIAGNOSIS — H04123 Dry eye syndrome of bilateral lacrimal glands: Secondary | ICD-10-CM

## 2017-09-19 DIAGNOSIS — E1122 Type 2 diabetes mellitus with diabetic chronic kidney disease: Secondary | ICD-10-CM

## 2017-09-19 DIAGNOSIS — I10 Essential (primary) hypertension: Secondary | ICD-10-CM | POA: Diagnosis not present

## 2017-09-19 DIAGNOSIS — Z794 Long term (current) use of insulin: Secondary | ICD-10-CM

## 2017-09-19 MED ORDER — ACETAMINOPHEN ER 650 MG PO TBCR: 650.0000 mg | EXTENDED_RELEASE_TABLET | Freq: Three times a day (TID) | ORAL | 0 refills | Status: DC | PRN

## 2017-09-19 MED ORDER — ZOSTER VACCINE LIVE 19400 UNT/0.65ML ~~LOC~~ SUSR: 0.6500 mL | Freq: Once | SUBCUTANEOUS | 0 refills | Status: AC

## 2017-09-19 NOTE — Progress Notes (Signed)
[facility]  Provider: Blanchie Serve MD   Location:  [place] of Service:  Clinic (12)  PCP: Blanchie Serve, MD Patient Care Team: Blanchie Serve, MD as PCP - General (Internal Medicine)  Extended Emergency Contact Information Primary Emergency Contact: [place] Address: [address]          [place], [place] [address] [name] Phone: [phone] Relation: Daughter  Goals of Care: Advanced Directive information Advanced Directives 09/19/2017  Does Patient Have a Medical Advance Directive? Yes  Type of Paramedic of [place];Living will  Does patient want to make changes to medical advance directive? No - Patient declined  Copy of [place] in Chart? Yes      Chief Complaint  Patient presents with  . Annual Exam    annual wellness exam  . Medication Refill    No refills needed at this time  . Results    Discuss labs  . MMSE    30/30. Passed clock drawing    HPI: Patient is a 84 y.o. Cook seen today for annual wellness exam. He had some neck pain to left side of his neck that resolved after taking a dose of aspirin. Currently symptom free.   Hyperlipidemia- on simvastatin 10 mg daily and tolerating it well, denies myalgia.   Hypertension- controlled BP reading this visit. On losartan 50 mg daily at present. Denies headache, chest pain and dyspnea.   Dry eyes- using OTC tear drops and has corrective glasses.  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 Blood sugar reading not checked at home.   Osteoarthritis- takes calcium and vitamin d supplement. Denies any pain this visit. Has occasional joint pain. Denies joint swelling or redness.   b12 deficiency with neuropathy- on b12 1000 mcg daily.   Past Medical History:  Diagnosis Date  . BPH (benign prostatic hyperplasia) 10/14/2014  . Cervicalgia  01/04/2016  . Contusion of left shoulder 05/27/14  . DM type 2 (diabetes mellitus, type 2) ([place])   . High blood pressure   . History of basal cell cancer 11/21/2005   Right temple  . History of colonic polyps 10/14/2003  . Hyperlipidemia   . Left knee pain 05/27/14  . SAH (subarachnoid hemorrhage) ([place]) 2009   TBI  . TIA (transient ischemic attack) 11/06/2005   Left eye pain. Slurred speech. Diaphoresis. No residual effects.    Past Surgical History:  Procedure Laterality Date  . Achilles tendon repair Left 1998   ruptured tendon  . COLONOSCOPY  1992   Polyp removed  . COLONOSCOPY  1998   normal  . ORIF FEMUR FRACTURE Right 1929  . TONSILLECTOMY      reports that he quit smoking about 46 years ago. He has never used smokeless tobacco. He reports that he drinks about 0.6 - 1.2 oz of alcohol per week. He reports that he does not use drugs. Social History   Socioeconomic History  . Marital status: Married    Spouse name: Not on file  . Number of children: Not on file  . Years of education: Not on file  . Highest education level: Not on file  Occupational History  . Occupation: retired Copy  . Financial resource strain: Not on file  . Food insecurity:    Worry: Not on file    Inability: Not on file  .  Transportation needs:    Medical: Not on file    Non-medical: Not on file  Tobacco Use  . Smoking status: Former Smoker    Last attempt to quit: 06/26/1971    Years since quitting: 46.2  . Smokeless tobacco: Never Used  . Tobacco comment: 2 1/2 packs a day, until 1972  Substance and Sexual Activity  . Alcohol use: Yes    Alcohol/week: 0.6 - 1.2 oz    Types: 1 - 2 Glasses of wine per week    Comment: Wine nightly  . Drug use: No  . Sexual activity: Not on file  Lifestyle  . Physical activity:    Days per week: Not on file    Minutes per session: Not on file  . Stress: Not on file  Relationships  . Social connections:    Talks on phone: Not on file     Gets together: Not on file    Attends religious service: Not on file    Active member of club or organization: Not on file    Attends meetings of clubs or organizations: Not on file    Relationship status: Not on file  . Intimate partner violence:    Fear of current or ex partner: Not on file    Emotionally abused: Not on file    Physically abused: Not on file    Forced sexual activity: Not on file  Other Topics Concern  . Not on file  Social History Narrative   Patient lives at [facility] since Dec 2015   Caffeine- Coffee   Married- Yes, 1951   [employer] 1943-46   House- Apartment with 2 people   Pets- No   Current/past profession- Press photographer   Exercise- Yes, walking   Living will- Yes   DNR- Yes   POA/HPOA- Yes       Functional Status Survey:    Family History  Problem Relation Age of Onset  . Heart attack Mother   . Heart disease Mother   . Heart disease Father     Health Maintenance  Topic Date Due  . OPHTHALMOLOGY EXAM  07/07/2017  . HEMOGLOBIN A1C  03/13/2018  . FOOT EXAM  06/27/2018  . TETANUS/TDAP  04/29/2027  . INFLUENZA VACCINE  Completed  . PNA vac Low Risk Adult  Completed    No Known Allergies  Outpatient Encounter Medications as of 09/19/2017  Medication Sig  . aspirin 325 MG EC tablet Take 325 mg by mouth daily.  . Blood Glucose Monitoring Suppl (ONE TOUCH ULTRA SYSTEM KIT) w/Device KIT Use to test blood sugar twice daily. Dx E11.9  . calcium carbonate 1250 MG capsule Take 600 mg by mouth daily.  . Cholecalciferol 1000 UNITS tablet Take 1,000 Units by mouth daily.  Marland Kitchen glucose blood test strip One Touch Ultra Test Strip Sig:Use to test blood sugar twice daily. Dx E11.9  . Lancets (UNILET COMFORTOUCH LANCET) MISC Use to test blood sugar twice daily. Dx: E11.9  . losartan (COZAAR) 50 MG tablet Take 1 tablet (50 mg total) by mouth daily.  . metFORMIN (GLUCOPHAGE) 1000 MG tablet TAKE 1 TABLET BY MOUTH EVERY MORNING TO CONTROL DIABETES  .  simvastatin (ZOCOR) 10 MG tablet Take 1 tablet (10 mg total) by mouth at bedtime.  . [DISCONTINUED] vitamin B-12 (CYANOCOBALAMIN) 1000 MCG tablet Take 1,000 mcg by mouth daily.  Marland Kitchen acetaminophen (TYLENOL 8 HOUR) 650 MG CR tablet Take 1 tablet (650 mg total) by mouth every 8 (eight) hours as  needed for pain.  Marland Kitchen Zoster Vaccine Live, PF, (ZOSTAVAX) 16109 UNT/0.65ML injection Inject 19,400 Units into the skin once for 1 dose.   No facility-administered encounter medications on file as of 09/19/2017.     Review of Systems  Constitutional: Negative for activity change, appetite change, chills, diaphoresis, fatigue, fever and unexpected weight change.       Walking for exercise  HENT: Positive for hearing loss, postnasal drip, rhinorrhea, sneezing and trouble swallowing. Negative for congestion, drooling, ear discharge, ear pain, facial swelling, mouth sores, nosebleeds, sinus pressure, sinus pain, sore throat, tinnitus and voice change.        Occasional trouble with solid food particles. Has been seen by SLP team and small bites and chewing properly, keeping water and drinking it in between bites if needed has been helpful  Eyes: Positive for discharge, itching and visual disturbance. Negative for pain and redness.       Uses artificial tears and this helps, wears corrective glasses, seen by eye doctor last year  Respiratory: Negative for cough, choking, shortness of breath and wheezing.   Cardiovascular: Positive for leg swelling. Negative for chest pain and palpitations.       Occasional  Swelling, subsides with leg elevation  Gastrointestinal: Negative for abdominal pain, anal bleeding, blood in stool, constipation, diarrhea, nausea, rectal pain and vomiting.       Moves his bowel everyday, mostly regular stool, some times has loose stool  Endocrine: Negative for cold intolerance, polydipsia, polyphagia and polyuria.  Genitourinary: Negative for decreased urine volume, discharge, dysuria, flank  pain, frequency, hematuria, penile pain and urgency.       Wakes up 1-2 times at night to urinate, has trouble initiating urination at times  Musculoskeletal: Positive for arthralgias. Negative for back pain, gait problem, joint swelling, neck pain and neck stiffness.       No fall reported, has bilateral shoulder and knee pain, has history of OA  Skin: Negative for rash and wound.  Neurological: Positive for dizziness and numbness. Negative for tremors, seizures, syncope, speech difficulty, weakness, light-headedness and headaches.       With sudden change of position has some dizziness, slow position change helps. Has neuropathy to hands right > left- pins and needle sensation  Hematological: Bruises/bleeds easily.  Psychiatric/Behavioral: Negative for agitation, behavioral problems, confusion, dysphoric mood, hallucinations, self-injury, sleep disturbance and suicidal ideas. The patient is not nervous/anxious.     Vitals:   09/19/17 1009  BP: 126/60  Pulse: 76  Resp: 16  Temp: 97.8 F (36.6 C)  TempSrc: Oral  SpO2: 97%  Weight: 153 lb 3.2 oz (69.5 kg)  Height: '5\' 8"'$  (1.727 m)   Body mass index is 23.29 kg/m.   Wt Readings from Last 3 Encounters:  09/19/17 153 lb 3.2 oz (69.5 kg)  06/27/17 153 lb (69.4 kg)  04/27/17 153 lb (69.4 kg)   Physical Exam  Constitutional: He is oriented to person, place, and time. He appears well-developed and well-nourished. No distress.  HENT:  Head: Normocephalic and atraumatic.  Right Ear: External ear normal.  Left Ear: External ear normal.  Nose: Nose normal.  Mouth/Throat: Oropharynx is clear and moist. No oropharyngeal exudate.  Eyes: Pupils are equal, round, and reactive to light. Conjunctivae and EOM are normal. Right eye exhibits no discharge. Left eye exhibits no discharge. No scleral icterus.  Has corrective glasses  Neck: Normal range of motion. Neck supple. No JVD present. No thyromegaly present.  Cardiovascular: Normal rate,  regular rhythm  and intact distal pulses.  Murmur heard. No carotid bruit  Pulmonary/Chest: Effort normal and breath sounds normal. No respiratory distress. He has no wheezes. He has no rales. He exhibits no tenderness.  Abdominal: Soft. Bowel sounds are normal. He exhibits no distension and no mass. There is no tenderness. There is no rebound and no guarding.  Musculoskeletal: He exhibits edema and deformity. He exhibits no tenderness.  Able to move all 4 extremities, ROM shoulder limited with OA, arthritis changes to digits, stable gait  Lymphadenopathy:    He has no cervical adenopathy.  Neurological: He is alert and oriented to person, place, and time. He has normal reflexes. No cranial nerve deficit. He exhibits normal muscle tone.  09/19/17 MMSE 30/30, passed clock draw  Skin: Skin is warm and dry. No rash noted. He is not diaphoretic. No erythema.  Psychiatric: He has a normal mood and affect. His behavior is normal. Thought content normal.    Labs reviewed: Basic Metabolic Panel: Recent Labs    06/15/17 0000 08/16/17 0000 09/10/17 0750  NA 138 139 138  K 4.6 4.5 4.7  CL 104 105 103  CO2 '27 27 25  '$ GLUCOSE 161* 155* 130*  BUN '18 18 20  '$ CREATININE 1.35* 1.21* 1.40*  CALCIUM 9.2 9.4 9.4   Liver Function Tests: Recent Labs    03/12/17 0810 08/16/17 0000 09/10/17 0750  AST '19 19 18  '$ ALT '22 17 21  '$ BILITOT 0.8 0.8 0.7  PROT 6.2 6.3 6.5   No results for input(s): LIPASE, AMYLASE in the last 8760 hours. No results for input(s): AMMONIA in the last 8760 hours. CBC: Recent Labs    06/15/17 0000 08/16/17 0000 09/10/17 0750  WBC 6.0 6.5 6.6  HGB 13.1* 13.1* 13.5  HCT 38.2* 38.3* 38.8  MCV 99.5 100.3* 97.2  PLT 320 339 384   Cardiac Enzymes: No results for input(s): CKTOTAL, CKMB, CKMBINDEX, TROPONINI in the last 8760 hours. BNP: Invalid input(s): POCBNP Lab Results  Component Value Date   HGBA1C 7.7 (H) 09/10/2017   No results found for: TSH Lab Results    Component Value Date   VITAMINB12 1,226 (H) 09/10/2017   No results found for: FOLATE No results found for: IRON, TIBC, FERRITIN  Lipid Panel: Recent Labs    03/12/17 0810 08/16/17 0000 09/10/17 0750  CHOL 144 164 153  HDL 63 58 51  LDLCALC 63 87 83  TRIG 99 93 91  CHOLHDL 2.3 2.8 3.0   Lab Results  Component Value Date   HGBA1C 7.7 (H) 09/10/2017    Procedures since last visit: No results found.   EKG 09/19/17- First degree AV block and RBBB, no new changes compared to EKG from 2007.   Assessment/Plan  1. CKD stage 3 due to type 2 diabetes mellitus (HCC) Avoid NSAIDs, check urine microalbumin, continue ARB. Reviewed BMP - CMP with eGFR([facility]); Future - Hemoglobin A1c; Future - Microalbumin/Creatinine Ratio, Urine; Future - Hemoglobin A1c; Future  2. Type 2 diabetes mellitus with diabetic neuropathy, with long-term current use of insulin (HCC) a1c 7.7 continue metformin 1000 mg daily, aspirin 325 mg daily and statin 10 mg daily.  - Hemoglobin A1c; Future - Microalbumin/Creatinine Ratio, Urine; Future - Hemoglobin A1c; Future  3. B12 deficiency High B12 level, normal h&h and MCV, discontinue b12 supplement  4. Dry eyes Continue artifical tears as needed, due for eye exam, patient mentions that he will make appointment with eye doctor  5. Hypertension, essential Continue losartan and monitor BP, bmp  reviewed  6. Generalized osteoarthritis Add tylenol 650 mg q8h prn pain. Fall precautions. Continue calcium and vitamin D   7. Hyperlipidemia LDL goal <70 Continue simvastatin 10 mg daily - Lipid Panel; Future  8. History of TIA (transient ischemic attack) Continue aspirin and statin. BP controlled. Reviewed lipid panel - CBC (no diff); Future  9. Need for shingles vaccine - Zoster Vaccine Live, PF, (ZOSTAVAX) 26378 UNT/0.65ML injection; Inject 19,400 Units into the skin once for 1 dose.  Dispense: 1 each; Refill: 0  10. Annual physical exam Reviewed his  medications, lab work and EKG. MMSE 30/30. uptodate with his pneumococcal, influenza and tdap. Script for shingles vaccine sent to pharmacy. counselled on diet and exercise, to cut on sweets. Medication compliance reinforced. counselled on need for routine check up, lab work, use of sunscreen, skin care.    Labs/tests ordered:    Lab Orders     CMP with eGFR([facility])     Lipid Panel     CBC (no diff)     Hemoglobin A1c     Microalbumin/Creatinine Ratio, Urine     Hemoglobin A1c   Next appointment: 6 months  Communication: reviewed care plan with patient   Blanchie Serve, MD Internal Medicine [place], [place] [address] Cell Phone (Monday-Friday 8 am - 5 pm): [phone] On Call: [phone] and follow prompts after 5 pm and on weekends Office Phone: [phone] Office Fax: [phone]

## 2017-09-19 NOTE — Patient Instructions (Signed)
   change aspirin to every day from every other day with your history of stroke, Diabetes and high cholesterol.  Stop taking b12 supplement with your level on blood test being on higher side.   Take tylenol 650 mg every 8 hour as needed for joint pain.

## 2017-09-20 ENCOUNTER — Other Ambulatory Visit: Payer: Self-pay | Admitting: Internal Medicine

## 2017-09-20 ENCOUNTER — Telehealth: Payer: Self-pay | Admitting: *Deleted

## 2017-09-20 DIAGNOSIS — Z23 Encounter for immunization: Secondary | ICD-10-CM

## 2017-09-20 MED ORDER — ZOSTER VAC RECOMB ADJUVANTED 50 MCG/0.5ML IM SUSR: 0.5000 mL | Freq: Once | INTRAMUSCULAR | 0 refills | Status: AC

## 2017-09-20 NOTE — Telephone Encounter (Signed)
Patient came to clinic today and said he was supposed to get the shingles vaccine. He went to get it and the pharmacist advised they no longer carry the zostavax that was prescribed and that he would need Rx for Shingrix. Can you please change this? Thanks!

## 2017-09-30 ENCOUNTER — Other Ambulatory Visit: Payer: Self-pay | Admitting: Internal Medicine

## 2017-09-30 DIAGNOSIS — E119 Type 2 diabetes mellitus without complications: Secondary | ICD-10-CM

## 2017-10-04 ENCOUNTER — Encounter (HOSPITAL_COMMUNITY): Payer: Self-pay

## 2017-10-04 ENCOUNTER — Emergency Department (HOSPITAL_COMMUNITY)
Admission: EM | Admit: 2017-10-04 | Discharge: 2017-10-04 | Disposition: A | Discharge status: Home / Self Care | Payer: Medicare Other | Attending: Emergency Medicine | Admitting: Emergency Medicine

## 2017-10-04 ENCOUNTER — Emergency Department (HOSPITAL_COMMUNITY): Payer: Medicare Other

## 2017-10-04 DIAGNOSIS — Z79899 Other long term (current) drug therapy: Secondary | ICD-10-CM | POA: Insufficient documentation

## 2017-10-04 DIAGNOSIS — S0990XA Unspecified injury of head, initial encounter: Secondary | ICD-10-CM | POA: Diagnosis not present

## 2017-10-04 DIAGNOSIS — Y999 Unspecified external cause status: Secondary | ICD-10-CM | POA: Insufficient documentation

## 2017-10-04 DIAGNOSIS — I10 Essential (primary) hypertension: Secondary | ICD-10-CM | POA: Diagnosis not present

## 2017-10-04 DIAGNOSIS — Z7982 Long term (current) use of aspirin: Secondary | ICD-10-CM | POA: Insufficient documentation

## 2017-10-04 DIAGNOSIS — Y939 Activity, unspecified: Secondary | ICD-10-CM | POA: Insufficient documentation

## 2017-10-04 DIAGNOSIS — E119 Type 2 diabetes mellitus without complications: Secondary | ICD-10-CM | POA: Diagnosis not present

## 2017-10-04 DIAGNOSIS — W19XXXA Unspecified fall, initial encounter: Secondary | ICD-10-CM

## 2017-10-04 DIAGNOSIS — W1830XA Fall on same level, unspecified, initial encounter: Secondary | ICD-10-CM | POA: Insufficient documentation

## 2017-10-04 DIAGNOSIS — S0181XA Laceration without foreign body of other part of head, initial encounter: Secondary | ICD-10-CM | POA: Diagnosis not present

## 2017-10-04 DIAGNOSIS — Y92032 Bedroom in apartment as the place of occurrence of the external cause: Secondary | ICD-10-CM | POA: Insufficient documentation

## 2017-10-04 DIAGNOSIS — Z87891 Personal history of nicotine dependence: Secondary | ICD-10-CM | POA: Diagnosis not present

## 2017-10-04 MED ORDER — LIDOCAINE-EPINEPHRINE (PF) 2 %-1:200000 IJ SOLN
20.0000 mL | Freq: Once | INTRAMUSCULAR | Status: AC
  Administered 2017-10-04: 20 mL
  Filled 2017-10-04: qty 20

## 2017-10-04 NOTE — ED Triage Notes (Signed)
Pt reports unwitnessed mechanical fall at home earlier today. Pt denies loc. Pt reports taking daily Aspirin. Pt has lac to forehead and bruising to right eye. Bleeding controlled. Pt denies vision changes. Pt A+OX4, NAD.

## 2017-10-04 NOTE — Discharge Instructions (Signed)
Keep laceration dry for 24 hours. May rinse with clean water and mild soap, tap dry and apply thin layer of antibiotic ointment at least twice daily. Follow up with primary care doctor for suture removal in 3-5 days. Return for increased swelling, redness, warmth, fevers.

## 2017-10-04 NOTE — ED Notes (Signed)
Patient transported to CT 

## 2017-10-04 NOTE — ED Provider Notes (Signed)
[facility] DEPT Provider Note   CSN: [id number] Arrival date & time: 10/04/17  1221     History   Chief Complaint Chief Complaint  Patient presents with  . Fall  . Head Laceration    HPI Nathan Cook is a 84 y.o. male with history of hypertension, hyperlipidemia, diabetes, kidney insufficiency, TIA here for evaluation of mechanical fall immediately PTA.  Patient states his right foot got caught in a bedspread and he fell forward.  He denies any prodromal symptoms prior to fall including dizziness, chest pain, shortness of breath.  He denies LOC.  If he has a small laceration to the center of her forehead without active bleeding.  Endorses local burning but has no headache.  Takes a daily aspirin.  He denies any vision changes, nausea, vomiting, neck pain, difficulty with speech or walking, unilateral numbness or weakness.  Fall was witnessed by his wife.  He was able to stand up on his own and ambulate.  Last tetanus done 2 months ago.  Has been at his baseline state of health in the last several days.  HPI  Past Medical History:  Diagnosis Date  . BPH (benign prostatic hyperplasia) 10/14/2014  . Cervicalgia 01/04/2016  . Contusion of left shoulder 05/27/14  . DM type 2 (diabetes mellitus, type 2) ([place])   . High blood pressure   . History of basal cell cancer 11/21/2005   Right temple  . History of colonic polyps 10/14/2003  . Hyperlipidemia   . Left knee pain 05/27/14  . SAH (subarachnoid hemorrhage) ([place]) 2009   TBI  . TIA (transient ischemic attack) 11/06/2005   Left eye pain. Slurred speech. Diaphoresis. No residual effects.     Patient Active Problem List   Diagnosis Date Noted  . Generalized osteoarthritis 06/27/2017  . B12 deficiency 06/27/2017  . History of TIA (transient ischemic attack) 03/21/2017  . Vitamin D deficiency 03/21/2017  . Dry eyes, bilateral 03/21/2017  . CKD (chronic kidney disease) stage 3, GFR 30-59 ml/min (HCC)  03/21/2017  . Cervicalgia 01/04/2016  . Pain in joint, shoulder region 05/11/2015  . BPH (benign prostatic hyperplasia) 10/14/2014  . Hyperlipidemia LDL goal <70   . Hypertension, essential   . Controlled type 2 diabetes mellitus with stage 3 chronic kidney disease, without long-term current use of insulin ([place])   . Left knee pain 05/27/2014  . Contusion of left shoulder 05/27/2014  . TIA (transient ischemic attack) 11/06/2005  . History of colonic polyps 10/14/2003    Past Surgical History:  Procedure Laterality Date  . Achilles tendon repair Left 1998   ruptured tendon  . COLONOSCOPY  1992   Polyp removed  . COLONOSCOPY  1998   normal  . ORIF FEMUR FRACTURE Right 1929  . TONSILLECTOMY          Home Medications    Prior to Admission medications   Medication Sig Start Date End Date Taking? Authorizing Provider  acetaminophen (TYLENOL 8 HOUR) 650 MG CR tablet Take 1 tablet (650 mg total) by mouth every 8 (eight) hours as needed for pain. 09/19/17  Yes Blanchie Serve, MD  aspirin 325 MG EC tablet Take 325 mg by mouth daily.   Yes [provider]  calcium carbonate 1250 MG capsule Take 600 mg by mouth daily.   Yes [provider]  Carboxymethylcellulose Sodium (EYE DROPS OP) Place 1 drop into both eyes daily as needed (dry eyes).   Yes [provider]  Cholecalciferol 1000  UNITS tablet Take 1,000 Units by mouth daily.   Yes [provider]  losartan (COZAAR) 50 MG tablet Take 1 tablet (50 mg total) by mouth daily. 08/10/17  Yes Blanchie Serve, MD  metFORMIN (GLUCOPHAGE) 1000 MG tablet TAKE ONE TABLET BY MOUTH EVERY MORNING TO CONTROL DIABETES 10/01/17  Yes Blanchie Serve, MD  simvastatin (ZOCOR) 10 MG tablet Take 1 tablet (10 mg total) by mouth at bedtime. 03/21/17  Yes Blanchie Serve, MD  Blood Glucose Monitoring Suppl (ONE TOUCH ULTRA SYSTEM KIT) w/Device KIT Use to test blood sugar twice daily. Dx E11.9 06/14/16   Estill Dooms, MD  glucose  blood test strip One Touch Ultra Test Strip Sig:Use to test blood sugar twice daily. Dx E11.9 07/28/16   Estill Dooms, MD  Lancets [facility] LANCET) MISC Use to test blood sugar twice daily. Dx: E11.9 09/13/16   Estill Dooms, MD    Family History Family History  Problem Relation Age of Onset  . Heart attack Mother   . Heart disease Mother   . Heart disease Father     Social History Social History   Tobacco Use  . Smoking status: Former Smoker    Last attempt to quit: 06/26/1971    Years since quitting: 46.3  . Smokeless tobacco: Never Used  . Tobacco comment: 2 1/2 packs a day, until 1972  Substance Use Topics  . Alcohol use: Yes    Alcohol/week: 0.6 - 1.2 oz    Types: 1 - 2 Glasses of wine per week    Comment: Wine nightly  . Drug use: No     Allergies   Patient has no known allergies.   Review of Systems Review of Systems  Skin: Positive for wound.  All other systems reviewed and are negative.    Physical Exam Updated Vital Signs BP (!) 144/70   Pulse 66   Temp 97.8 F (36.6 C) (Oral)   Resp 16   Ht 5' 8.5" (1.74 m)   Wt 64 kg (141 lb)   SpO2 99%   BMI 21.13 kg/m   Physical Exam  Constitutional: He is oriented to person, place, and time. He appears well-developed and well-nourished. No distress.  Non toxic.  HENT:  Head: Normocephalic and atraumatic.  Nose: Nose normal.  Mouth/Throat: No oropharyngeal exudate.  No tenderness to nasal bones.  Maxilla and mandible nontender.  No malocclusion.  Atraumatic scalp without hematoma or skin injury.  No epistaxis, septum midline.  No hemotympanum.  No battle signs.  Moist mucous membranes.  Oropharynx and tonsils normal.  No intraoral injury or bleeding.  Eyes: Conjunctivae are normal.  PERRLA and EOMs intact bilaterally.  No nystagmus.  Neck: Normal range of motion.  No midline cervical spine tenderness, step-offs, muscular tenderness.  Full active range of motion without pain or rigidity.    Cardiovascular: Normal rate, regular rhythm, normal heart sounds and intact distal pulses.  No murmur heard. Pulses:      Radial pulses are 2+ on the right side, and 2+ on the left side.       Dorsalis pedis pulses are 2+ on the right side, and 2+ on the left side.  2+ DP and radial pulses bilaterally. No LE edema.   Pulmonary/Chest: Effort normal and breath sounds normal.  No chest wall tenderness  Abdominal: Soft. Bowel sounds are normal. There is no tenderness.  No G/R/R. No suprapubic or CVA tenderness.   Musculoskeletal: Normal range of motion. He exhibits no  deformity.  Full passive range of motion of upper and lower extremities without reported pain.  No obvious deformities.  Neurological: He is alert and oriented to person, place, and time.  Alert and oriented to self, place, time and event.  Speech is fluent without obvious dysarthria or dysphasia. Strength 5/5 with hand grip and ankle F/E.   Sensation to light touch intact in hands and feet. Normal gait. No pronator drift. Normal finger-to-nose and finger tapping.  CN I and VIII not tested. CN II-XII grossly intact bilaterally.   Skin: Skin is warm and dry. Capillary refill takes less than 2 seconds. Abrasion and laceration noted.  3 cm straight laceration to the central forehead, hemostatic, appropriately tender.  No surrounding crepitus, depression noted.   Small abrasion and ecchymosis  to the right lateral corner of the right eye, without active bleeding.   Psychiatric: He has a normal mood and affect. His behavior is normal. Judgment and thought content normal.  Nursing note and vitals reviewed.    ED Treatments / Results  Labs (all labs ordered are listed, but only abnormal results are displayed) Labs Reviewed - No data to display  EKG None  Radiology Ct Head Wo Contrast  Result Date: 10/04/2017 CLINICAL DATA:  Unwitnessed fall. EXAM: CT HEAD WITHOUT CONTRAST TECHNIQUE: Contiguous axial images were obtained  from the base of the skull through the vertex without intravenous contrast. COMPARISON:  None. FINDINGS: Brain: No evidence of acute infarction, hemorrhage, hydrocephalus, extra-axial collection or mass lesion/mass effect. Old lacunar infarct in the left basal ganglia. Mild to moderate generalized cerebral atrophy. Vascular: Calcified atherosclerosis at the skullbase. No hyperdense vessel. Skull: Negative for fracture or focal lesion. Sinuses/Orbits: Chronic left maxillary sinusitis. Mild mucosal thickening in the left frontal sinus. Remaining paranasal sinuses and mastoid air cells are clear. No acute orbital abnormality. Other: None. IMPRESSION: 1.  No acute intracranial abnormality. Electronically Signed   By: Titus Dubin M.D.   On: 10/04/2017 16:55    Procedures Procedures (including critical care time)  Medications Ordered in ED Medications  lidocaine-EPINEPHrine (XYLOCAINE W/EPI) 2 %-1:200000 (PF) injection 20 mL (20 mLs Infiltration Given by Other 10/04/17 1348)     Initial Impression / Assessment and Plan / ED Course  I have reviewed the triage vital signs and the nursing notes.  Pertinent labs & imaging results that were available during my care of the patient were reviewed by me and considered in my medical decision making (see chart for details).    84 year old here after mechanical fall leading to forehead laceration.  No LOC, anticoagulant use, prodromal symptoms prior to fall.  Laceration repaired without complications.   Exam as above otherwise reassuring.  CT scan negative. Pt shared with Dr. Jeneen Rinks. Deemed appropriate for dc with wound care instructions and return precautions.   Final Clinical Impressions(s) / ED Diagnoses   Final diagnoses:  Fall, initial encounter  Laceration of forehead, initial encounter    ED Discharge Orders    None       Arlean Hopping 10/04/17 1702    Tanna Furry, MD 10/06/17 [phone]

## 2017-10-05 DIAGNOSIS — Z23 Encounter for immunization: Secondary | ICD-10-CM | POA: Diagnosis not present

## 2017-10-09 ENCOUNTER — Non-Acute Institutional Stay: Payer: Medicare Other | Admitting: Family

## 2017-10-09 ENCOUNTER — Encounter: Payer: Self-pay | Admitting: Family

## 2017-10-09 VITALS — BP 124/60 | HR 93 | Temp 97.8°F | Resp 14 | Ht 68.5 in | Wt 141.0 lb

## 2017-10-09 DIAGNOSIS — S0083XD Contusion of other part of head, subsequent encounter: Secondary | ICD-10-CM | POA: Diagnosis not present

## 2017-10-09 DIAGNOSIS — S0181XD Laceration without foreign body of other part of head, subsequent encounter: Secondary | ICD-10-CM | POA: Diagnosis not present

## 2017-10-09 NOTE — Progress Notes (Signed)
Location:  [facility] of Service:  Clinic (12) Provider: Dinah Ngetich FNP-C  Blanchie Serve, MD  Patient Care Team: Blanchie Serve, MD as PCP - General (Internal Medicine)  Extended Emergency Contact Information Primary Emergency Contact: [place] Address: [address]          [place],  [address] [place] Phone: [phone] Relation: Daughter  Code Status:  DNR Goals of care: Advanced Directive information Advanced Directives 10/09/2017  Does Patient Have a Medical Advance Directive? Yes  Type of Paramedic of [place];Living will  Does patient want to make changes to medical advance directive? -  Copy of [place] of [place] in Chart? Yes     Chief Complaint  Patient presents with  . Acute Visit    suture removal    HPI:  Pt is a 84 y.o. male seen today at [facility] for an acute visit for removal sutures.He is status post ED visit on 10/04/2017 post fall after his right foot got caught in a bedspread and he fell forward sustaining a laceration to center of his forehead.He required four stitches to mid forehead.He denies any pain,fever,chills or drainage from laceration site.He states has been cleaning area,applying triple antibiotic ointment and covering with band aid daily.   Past Medical History:  Diagnosis Date  . BPH (benign prostatic hyperplasia) 10/14/2014  . Cervicalgia 01/04/2016  . Contusion of left shoulder 05/27/14  . DM type 2 (diabetes mellitus, type 2) ([place])   . High blood pressure   . History of basal cell cancer 11/21/2005   Right temple  . History of colonic polyps 10/14/2003  . Hyperlipidemia   . Left knee pain 05/27/14  . SAH (subarachnoid hemorrhage) ([place]) 2009   TBI  . TIA (transient ischemic attack) 11/06/2005   Left eye pain. Slurred speech. Diaphoresis. No residual effects.    Past Surgical History:  Procedure Laterality Date  . Achilles tendon  repair Left 1998   ruptured tendon  . COLONOSCOPY  1992   Polyp removed  . COLONOSCOPY  1998   normal  . ORIF FEMUR FRACTURE Right 1929  . TONSILLECTOMY      No Known Allergies  Outpatient Encounter Medications as of 10/09/2017  Medication Sig  . acetaminophen (TYLENOL 8 HOUR) 650 MG CR tablet Take 1 tablet (650 mg total) by mouth every 8 (eight) hours as needed for pain.  Marland Kitchen aspirin 325 MG EC tablet Take 325 mg by mouth daily.  . Blood Glucose Monitoring Suppl (ONE TOUCH ULTRA SYSTEM KIT) w/Device KIT Use to test blood sugar twice daily. Dx E11.9  . calcium carbonate 1250 MG capsule Take 600 mg by mouth daily.  . Carboxymethylcellulose Sodium (EYE DROPS OP) Place 1 drop into both eyes daily as needed (dry eyes).  . Cholecalciferol 1000 UNITS tablet Take 1,000 Units by mouth daily.  Marland Kitchen glucose blood test strip One Touch Ultra Test Strip Sig:Use to test blood sugar twice daily. Dx E11.9  . Lancets (UNILET COMFORTOUCH LANCET) MISC Use to test blood sugar twice daily. Dx: E11.9  . losartan (COZAAR) 50 MG tablet Take 1 tablet (50 mg total) by mouth daily.  . metFORMIN (GLUCOPHAGE) 1000 MG tablet TAKE ONE TABLET BY MOUTH EVERY MORNING TO CONTROL DIABETES  . simvastatin (ZOCOR) 10 MG tablet Take 1 tablet (10 mg total) by mouth at bedtime.   No facility-administered encounter medications on file as of 10/09/2017.     Review of Systems  Constitutional: Negative for chills and fever.  Eyes: Negative for discharge and redness.       Wears eye glasses   Respiratory: Negative for cough, chest tightness, shortness of breath and wheezing.   Skin: Negative for color change, pallor and rash.       Mid forehead laceration with sutures.  Neurological: Negative for dizziness, light-headedness and headaches.  Psychiatric/Behavioral: Negative for agitation and sleep disturbance. The patient is not nervous/anxious.     Immunization History  Administered Date(s) Administered  . DTaP 12/31/2012  .  Influenza, High Dose Seasonal PF 04/04/2017  . Influenza-Unspecified 04/10/2014, 03/25/2015, 04/06/2016  . PPD Test 04/29/2014  . Pneumococcal Conjugate-13 04/28/2017  . Pneumococcal-Unspecified 03/13/2011  . Tdap 04/28/2017   Pertinent  Health Maintenance Due  Topic Date Due  . OPHTHALMOLOGY EXAM  07/07/2017  . INFLUENZA VACCINE  01/24/2018  . HEMOGLOBIN A1C  03/13/2018  . FOOT EXAM  06/27/2018  . PNA vac Low Risk Adult  Completed   Fall Risk  04/27/2017 03/21/2017 09/07/2015 09/08/2014  Falls in the past year? No No No Yes  Number falls in past yr: - - - 1  Injury with Fall? - - - No  Risk for fall due to : - Medication side effect - -    Vitals:   10/09/17 1407  BP: 124/60  Pulse: 93  Resp: 14  Temp: 97.8 F (36.6 C)  TempSrc: Oral  SpO2: 96%  Weight: 141 lb (64 kg)  Height: 5' 8.5" (1.74 m)   Body mass index is 21.13 kg/m. Physical Exam  Constitutional: He is oriented to person, place, and time. He appears well-developed and well-nourished.  Elderly in no acute distress  HENT:  Head: Normocephalic.  Mouth/Throat: Oropharynx is clear and moist. No oropharyngeal exudate.  HOH  Eyes: Pupils are equal, round, and reactive to light. Conjunctivae are normal. Right eye exhibits no discharge. Left eye exhibits no discharge. No scleral icterus.  Neurological: He is oriented to person, place, and time.  Skin: Skin is warm and dry. No rash noted. No erythema. No pallor.  Mid forehead laceration cleansed with alcohol wipe,pat dry with gauze then  X 4 sutures removed with forceps and suture removal scissors without any difficulties.Patient tolerated procedure well.insicion cleansed with betadine.Triple antibiotic applied and covered with self adhesive band aid.No signs or symptoms of infection noted.    Right side face purple bruise progressive healing noted.  Psychiatric: He has a normal mood and affect.   Labs reviewed: Recent Labs    06/15/17 0000 08/16/17 0000  09/10/17 0750  NA 138 139 138  K 4.6 4.5 4.7  CL 104 105 103  CO2 '27 27 25  '$ GLUCOSE 161* 155* 130*  BUN '18 18 20  '$ CREATININE 1.35* 1.21* 1.40*  CALCIUM 9.2 9.4 9.4   Recent Labs    03/12/17 0810 08/16/17 0000 09/10/17 0750  AST '19 19 18  '$ ALT '22 17 21  '$ BILITOT 0.8 0.8 0.7  PROT 6.2 6.3 6.5   Recent Labs    06/15/17 0000 08/16/17 0000 09/10/17 0750  WBC 6.0 6.5 6.6  HGB 13.1* 13.1* 13.5  HCT 38.2* 38.3* 38.8  MCV 99.5 100.3* 97.2  PLT 320 339 384   No results found for: TSH Lab Results  Component Value Date   HGBA1C 7.7 (H) 09/10/2017   Lab Results  Component Value Date   CHOL 153 09/10/2017   HDL 51 09/10/2017   LDLCALC 83 09/10/2017   TRIG 91 09/10/2017  CHOLHDL 3.0 09/10/2017    Significant Diagnostic Results in last 30 days:  Ct Head Wo Contrast  Result Date: 10/04/2017 CLINICAL DATA:  Unwitnessed fall. EXAM: CT HEAD WITHOUT CONTRAST TECHNIQUE: Contiguous axial images were obtained from the base of the skull through the vertex without intravenous contrast. COMPARISON:  None. FINDINGS: Brain: No evidence of acute infarction, hemorrhage, hydrocephalus, extra-axial collection or mass lesion/mass effect. Old lacunar infarct in the left basal ganglia. Mild to moderate generalized cerebral atrophy. Vascular: Calcified atherosclerosis at the skullbase. No hyperdense vessel. Skull: Negative for fracture or focal lesion. Sinuses/Orbits: Chronic left maxillary sinusitis. Mild mucosal thickening in the left frontal sinus. Remaining paranasal sinuses and mastoid air cells are clear. No acute orbital abnormality. Other: None. IMPRESSION: 1.  No acute intracranial abnormality. Electronically Signed   By: Titus Dubin M.D.   On: 10/04/2017 16:55    Assessment/Plan 1. Laceration of forehead without complication, subsequent encounter Afebrile.Status post ED visit post fall sustaining mid forehead laceration.Sutured in ED.no signs or symptoms of infections noted.Had x 4  sutures removed today after cleansing site with alcohol wipe and pat dry with gauze .Tolerated procedure well.Laceration area cleansed with betadine and triple antibiotic ointment applied and covered with self adhesive band aid.Instructions given to cleanse laceration area with soap and water,pat dry,apply TAO daily and cover with band Aid. Notify provider's office for any signs or symptoms of infection,fever or chills.    2. Bruise of face, subsequent encounter Progressive healing noted.no tenderness or pain.continue to monitor.   Family/ staff Communication: Reviewed plan of care with patient.  Labs/tests ordered: Sutures x 4 removed from mid forehead.   Sandrea Hughs, NP

## 2017-10-09 NOTE — Patient Instructions (Addendum)
Notify provider if you have any fever,chills,swelling,redness or drainage from incision site. Clean incision site with soap and water,pat dry,apply triple antibiotic ointment and cover with bandage.  Incision Care, Adult An incision is a cut that a doctor makes in your skin for surgery (for a procedure). Most times, these cuts are closed after surgery. Your cut from surgery may be closed with stitches (sutures), staples, skin glue, or skin tape (adhesive strips). You may need to return to your doctor to have stitches or staples taken out. This may happen many days or many weeks after your surgery. The cut needs to be well cared for so it does not get infected. How to care for your cut Cut care  Follow instructions from your doctor about how to take care of your cut. Make sure you: ? Wash your hands with soap and water before you change your bandage (dressing). If you cannot use soap and water, use hand sanitizer. ? Change your bandage as told by your doctor. ? Leave stitches, skin glue, or skin tape in place. They may need to stay in place for 2 weeks or longer. If tape strips get loose and curl up, you may trim the loose edges. Do not remove tape strips completely unless your doctor says it is okay.  Check your cut area every day for signs of infection. Check for: ? More redness, swelling, or pain. ? More fluid or blood. ? Warmth. ? Pus or a bad smell.  Ask your doctor how to clean the cut. This may include: ? Using mild soap and water. ? Using a clean towel to pat the cut dry after you clean it. ? Putting a cream or ointment on the cut. Do this only as told by your doctor. ? Covering the cut with a clean bandage.  Ask your doctor when you can leave the cut uncovered.  Do not take baths, swim, or use a hot tub until your doctor says it is okay. Ask your doctor if you can take showers. You may only be allowed to take sponge baths for bathing. Medicines  If you were prescribed an  antibiotic medicine, cream, or ointment, take the antibiotic or put it on the cut as told by your doctor. Do not stop taking or putting on the antibiotic even if your condition gets better.  Take over-the-counter and prescription medicines only as told by your doctor. General instructions  Limit movement around your cut. This helps healing. ? Avoid straining, lifting, or exercise for the first month, or for as long as told by your doctor. ? Follow instructions from your doctor about going back to your normal activities. ? Ask your doctor what activities are safe.  Protect your cut from the sun when you are outside for the first 6 months, or for as long as told by your doctor. Put on sunscreen around the scar or cover up the scar.  Keep all follow-up visits as told by your doctor. This is important. Contact a doctor if:  Your have more redness, swelling, or pain around the cut.  You have more fluid or blood coming from the cut.  Your cut feels warm to the touch.  You have pus or a bad smell coming from the cut.  You have a fever or shaking chills.  You feel sick to your stomach (nauseous) or you throw up (vomit).  You are dizzy.  Your stitches or staples come undone. Get help right away if:  You have a red  streak coming from your cut.  Your cut bleeds through the bandage and the bleeding does not stop with gentle pressure.  The edges of your cut open up and separate.  You have very bad (severe) pain.  You have a rash.  You are confused.  You pass out (faint).  You have trouble breathing and you have a fast heartbeat. This information is not intended to replace advice given to you by your health care provider. Make sure you discuss any questions you have with your health care provider. Document Released: 09/04/2011 Document Revised: 02/18/2016 Document Reviewed: 02/18/2016 Elsevier Interactive Patient Education  2017 [employer].

## 2017-10-26 DIAGNOSIS — M79671 Pain in right foot: Secondary | ICD-10-CM | POA: Diagnosis not present

## 2017-10-26 DIAGNOSIS — L84 Corns and callosities: Secondary | ICD-10-CM | POA: Diagnosis not present

## 2017-10-26 DIAGNOSIS — B351 Tinea unguium: Secondary | ICD-10-CM | POA: Diagnosis not present

## 2017-10-26 DIAGNOSIS — M79672 Pain in left foot: Secondary | ICD-10-CM | POA: Diagnosis not present

## 2017-10-31 DIAGNOSIS — L814 Other melanin hyperpigmentation: Secondary | ICD-10-CM | POA: Diagnosis not present

## 2017-10-31 DIAGNOSIS — L821 Other seborrheic keratosis: Secondary | ICD-10-CM | POA: Diagnosis not present

## 2017-10-31 DIAGNOSIS — Z85828 Personal history of other malignant neoplasm of skin: Secondary | ICD-10-CM | POA: Diagnosis not present

## 2017-12-14 ENCOUNTER — Encounter: Payer: Self-pay | Admitting: Internal Medicine

## 2017-12-14 DIAGNOSIS — Z23 Encounter for immunization: Secondary | ICD-10-CM | POA: Diagnosis not present

## 2017-12-19 DIAGNOSIS — Z794 Long term (current) use of insulin: Secondary | ICD-10-CM | POA: Diagnosis not present

## 2017-12-19 DIAGNOSIS — E1140 Type 2 diabetes mellitus with diabetic neuropathy, unspecified: Secondary | ICD-10-CM | POA: Diagnosis not present

## 2017-12-19 DIAGNOSIS — E1122 Type 2 diabetes mellitus with diabetic chronic kidney disease: Secondary | ICD-10-CM | POA: Diagnosis not present

## 2017-12-19 DIAGNOSIS — N183 Chronic kidney disease, stage 3 (moderate): Secondary | ICD-10-CM | POA: Diagnosis not present

## 2017-12-20 ENCOUNTER — Other Ambulatory Visit: Payer: Self-pay

## 2017-12-20 ENCOUNTER — Telehealth: Payer: Self-pay

## 2017-12-20 ENCOUNTER — Other Ambulatory Visit: Payer: Self-pay | Admitting: Internal Medicine

## 2017-12-20 DIAGNOSIS — E1122 Type 2 diabetes mellitus with diabetic chronic kidney disease: Secondary | ICD-10-CM

## 2017-12-20 DIAGNOSIS — Z794 Long term (current) use of insulin: Secondary | ICD-10-CM | POA: Diagnosis not present

## 2017-12-20 DIAGNOSIS — N183 Chronic kidney disease, stage 3 unspecified: Secondary | ICD-10-CM

## 2017-12-20 DIAGNOSIS — E1140 Type 2 diabetes mellitus with diabetic neuropathy, unspecified: Secondary | ICD-10-CM

## 2017-12-20 LAB — MICROALBUMIN, URINE: Microalb, Ur: 1

## 2017-12-20 NOTE — Telephone Encounter (Signed)
Specimen has been picked up by [facility].

## 2017-12-20 NOTE — Telephone Encounter (Signed)
Received a e-mail from [facility] coordinator that Mr. Potenza dropped off a urine specimen with her because that's what the lab told him to do. Per Dr. Bubba Camp I advised [facility] to discard the urine sample and that I would call and speak with the patient.   I called to speak with Mr. Sterbenz but he wasn't home. So I left a voicemail for the patient to return my call her at the clinic [facility]. I will try to contact the patient again if I haven't heard from him by lunch time today.

## 2017-12-21 LAB — MICROALBUMIN / CREATININE URINE RATIO
Creatinine, Random Urine: 118
Creatinine, Urine: 118 mg/dL (ref 20–320)
MICROALB UR: 1 mg/dL
MICROALB/CREAT RATIO: 8 ug/mg{creat} (ref <30)
Microalb Creat Ratio: 8

## 2017-12-24 LAB — HEMOGLOBIN A1C
HEMOGLOBIN A1C: 7.3 %{Hb} — AB (ref <5.7)
Mean Plasma Glucose: 163 (calc)
eAG (mmol/L): 9 (calc)

## 2017-12-24 LAB — MICROALBUMIN / CREATININE URINE RATIO

## 2018-01-04 DIAGNOSIS — M79671 Pain in right foot: Secondary | ICD-10-CM | POA: Diagnosis not present

## 2018-01-04 DIAGNOSIS — M79672 Pain in left foot: Secondary | ICD-10-CM | POA: Diagnosis not present

## 2018-01-04 DIAGNOSIS — B351 Tinea unguium: Secondary | ICD-10-CM | POA: Diagnosis not present

## 2018-01-04 DIAGNOSIS — L84 Corns and callosities: Secondary | ICD-10-CM | POA: Diagnosis not present

## 2018-02-06 ENCOUNTER — Encounter: Payer: Self-pay | Admitting: Internal Medicine

## 2018-03-05 DIAGNOSIS — Z85828 Personal history of other malignant neoplasm of skin: Secondary | ICD-10-CM | POA: Diagnosis not present

## 2018-03-05 DIAGNOSIS — L814 Other melanin hyperpigmentation: Secondary | ICD-10-CM | POA: Diagnosis not present

## 2018-03-05 DIAGNOSIS — L821 Other seborrheic keratosis: Secondary | ICD-10-CM | POA: Diagnosis not present

## 2018-03-05 DIAGNOSIS — L570 Actinic keratosis: Secondary | ICD-10-CM | POA: Diagnosis not present

## 2018-03-06 ENCOUNTER — Encounter: Payer: Self-pay | Admitting: Internal Medicine

## 2018-03-06 ENCOUNTER — Non-Acute Institutional Stay: Payer: Medicare Other | Admitting: Internal Medicine

## 2018-03-06 VITALS — BP 146/68 | HR 75 | Temp 97.9°F | Wt 145.2 lb

## 2018-03-06 DIAGNOSIS — I739 Peripheral vascular disease, unspecified: Secondary | ICD-10-CM | POA: Diagnosis not present

## 2018-03-06 DIAGNOSIS — S81802A Unspecified open wound, left lower leg, initial encounter: Secondary | ICD-10-CM

## 2018-03-06 NOTE — Progress Notes (Signed)
[facility]  Provider: Blanchie Serve MD   Location:  [place] of Service:  Clinic (12)  PCP: Blanchie Serve, MD Patient Care Team: Blanchie Serve, MD as PCP - General (Internal Medicine)  Extended Emergency Contact Information Primary Emergency Contact: [place] Address: [address]          [place], [place] [address] [name] of [place] Phone: [phone] Relation: Daughter   Goals of Care: Advanced Directive information Advanced Directives 10/09/2017  Does Patient Have a Medical Advance Directive? Yes  Type of Paramedic of [place];Living will  Does patient want to make changes to medical advance directive? -  Copy of [place] in Chart? Yes      Chief Complaint  Patient presents with  . Acute Visit    wound to left leg, calf pain    HPI: Patient is a 84 y.o. male seen today for acute visit. He has noticed a wound to his left leg. It has been there for 2 weeks. Denies any open area or drainage. Denies pain at the wound spot. Does not remember any trauma or injury.  He complaints of feeling of tightness radiating up his calf while walking that is relieved with rest. He provides history of achilles tendon repair in past. He sees podiatry service for fungal infection to his toe nail.   Past Medical History:  Diagnosis Date  . BPH (benign prostatic hyperplasia) 10/14/2014  . Cervicalgia 01/04/2016  . Contusion of left shoulder 05/27/14  . DM type 2 (diabetes mellitus, type 2) ([place])   . High blood pressure   . History of basal cell cancer 11/21/2005   Right temple  . History of colonic polyps 10/14/2003  . Hyperlipidemia   . Left knee pain 05/27/14  . SAH (subarachnoid hemorrhage) ([place]) 2009   TBI  . TIA (transient ischemic attack) 11/06/2005   Left eye pain. Slurred speech. Diaphoresis. No residual effects.    Past Surgical History:  Procedure Laterality Date  . Achilles  tendon repair Left 1998   ruptured tendon  . COLONOSCOPY  1992   Polyp removed  . COLONOSCOPY  1998   normal  . ORIF FEMUR FRACTURE Right 1929  . TONSILLECTOMY      reports that he quit smoking about 46 years ago. He has never used smokeless tobacco. He reports that he drinks about 1.0 - 2.0 standard drinks of alcohol per week. He reports that he does not use drugs. Social History   Socioeconomic History  . Marital status: Married    Spouse name: Not on file  . Number of children: Not on file  . Years of education: Not on file  . Highest education level: Not on file  Occupational History  . Occupation: retired Copy  . Financial resource strain: Not on file  . Food insecurity:    Worry: Not on file    Inability: Not on file  . Transportation needs:    Medical: Not on file    Non-medical: Not on file  Tobacco Use  . Smoking status: Former Smoker    Last attempt to quit: 06/26/1971    Years since quitting: 46.7  . Smokeless tobacco: Never Used  . Tobacco comment: 2 1/2 packs a day, until 1972  Substance and Sexual Activity  . Alcohol use: Yes    Alcohol/week: 1.0 - 2.0 standard drinks    Types: 1 - 2  Glasses of wine per week    Comment: Wine nightly  . Drug use: No  . Sexual activity: Not on file  Lifestyle  . Physical activity:    Days per week: Not on file    Minutes per session: Not on file  . Stress: Not on file  Relationships  . Social connections:    Talks on phone: Not on file    Gets together: Not on file    Attends religious service: Not on file    Active member of club or organization: Not on file    Attends meetings of clubs or organizations: Not on file    Relationship status: Not on file  . Intimate partner violence:    Fear of current or ex partner: Not on file    Emotionally abused: Not on file    Physically abused: Not on file    Forced sexual activity: Not on file  Other Topics Concern  . Not on file  Social History  Narrative   Patient lives at [facility] since Dec 2015   Caffeine- Coffee   Married- Yes, 1951   [employer] [employer] 1943-46   House- Apartment with 2 people   Pets- No   Current/past profession- Sales   Exercise- Yes, walking   Living will- Yes   DNR- Yes   POA/HPOA- Yes        Family History  Problem Relation Age of Onset  . Heart attack Mother   . Heart disease Mother   . Heart disease Father     Health Maintenance  Topic Date Due  . OPHTHALMOLOGY EXAM  07/07/2017  . INFLUENZA VACCINE  01/24/2018  . HEMOGLOBIN A1C  06/20/2018  . FOOT EXAM  06/27/2018  . TETANUS/TDAP  04/29/2027  . PNA vac Low Risk Adult  Completed    No Known Allergies  Outpatient Encounter Medications as of 03/06/2018  Medication Sig  . acetaminophen (TYLENOL 8 HOUR) 650 MG CR tablet Take 1 tablet (650 mg total) by mouth every 8 (eight) hours as needed for pain.  Marland Kitchen aspirin 325 MG EC tablet Take 325 mg by mouth daily.  . Blood Glucose Monitoring Suppl (ONE TOUCH ULTRA SYSTEM KIT) w/Device KIT Use to test blood sugar twice daily. Dx E11.9  . calcium carbonate 1250 MG capsule Take 600 mg by mouth daily.  . Carboxymethylcellulose Sodium (EYE DROPS OP) Place 1 drop into both eyes daily as needed (dry eyes).  . Cholecalciferol 1000 UNITS tablet Take 1,000 Units by mouth daily.  Marland Kitchen glucose blood test strip One Touch Ultra Test Strip Sig:Use to test blood sugar twice daily. Dx E11.9  . Lancets (UNILET COMFORTOUCH LANCET) MISC Use to test blood sugar twice daily. Dx: E11.9  . losartan (COZAAR) 50 MG tablet Take 1 tablet (50 mg total) by mouth daily.  . metFORMIN (GLUCOPHAGE) 1000 MG tablet TAKE ONE TABLET BY MOUTH EVERY MORNING TO CONTROL DIABETES  . simvastatin (ZOCOR) 10 MG tablet Take 1 tablet (10 mg total) by mouth at bedtime.   No facility-administered encounter medications on file as of 03/06/2018.     Review of Systems  Constitutional: Negative for appetite change, chills and fever.  Respiratory:  Negative for shortness of breath.   Cardiovascular: Negative for chest pain.  Musculoskeletal:       No fall reported  Neurological: Negative for dizziness and weakness.    Vitals:   03/06/18 1039  BP: (!) 146/68  Pulse: 75  Temp: 97.9 F (36.6 C)  SpO2: 94%  Weight: 145 lb 3.2 oz (65.9 kg)   Body mass index is 21.76 kg/m. Physical Exam  Constitutional: He is oriented to person, place, and time. He appears well-developed and well-nourished. No distress.  HENT:  Head: Normocephalic and atraumatic.  Mouth/Throat: Oropharynx is clear and moist.  Eyes: Pupils are equal, round, and reactive to light. Conjunctivae and EOM are normal.  Musculoskeletal: Normal range of motion.  Trace ankle edema. Varicose veins. Purplish discoloration to skin around toes. Diminished dorsalis pedis. Negative homan's sign. Wound with scab to left posterior leg, skin intact, no drainage or opening, non tender  Neurological: He is alert and oriented to person, place, and time.  Skin: Skin is warm and dry. He is not diaphoretic.  Psychiatric: He has a normal mood and affect.    Labs reviewed: Basic Metabolic Panel: Recent Labs    06/15/17 0000 08/16/17 0000 09/10/17 0750  NA 138 139 138  K 4.6 4.5 4.7  CL 104 105 103  CO2 '27 27 25  '$ GLUCOSE 161* 155* 130*  BUN '18 18 20  '$ CREATININE 1.35* 1.21* 1.40*  CALCIUM 9.2 9.4 9.4   Liver Function Tests: Recent Labs    03/12/17 0810 08/16/17 0000 09/10/17 0750  AST '19 19 18  '$ ALT '22 17 21  '$ BILITOT 0.8 0.8 0.7  PROT 6.2 6.3 6.5   No results for input(s): LIPASE, AMYLASE in the last 8760 hours. No results for input(s): AMMONIA in the last 8760 hours. CBC: Recent Labs    06/15/17 0000 08/16/17 0000 09/10/17 0750  WBC 6.0 6.5 6.6  HGB 13.1* 13.1* 13.5  HCT 38.2* 38.3* 38.8  MCV 99.5 100.3* 97.2  PLT 320 339 384   Cardiac Enzymes: No results for input(s): CKTOTAL, CKMB, CKMBINDEX, TROPONINI in the last 8760 hours. BNP: Invalid input(s):  POCBNP Lab Results  Component Value Date   HGBA1C 7.3 (H) 12/19/2017   No results found for: TSH Lab Results  Component Value Date   VITAMINB12 1,226 (H) 09/10/2017   No results found for: FOLATE No results found for: IRON, TIBC, FERRITIN  Lipid Panel: Recent Labs    03/12/17 0810 08/16/17 0000 09/10/17 0750  CHOL 144 164 153  HDL 63 58 51  LDLCALC 63 87 83  TRIG 99 93 91  CHOLHDL 2.3 2.8 3.0   Lab Results  Component Value Date   HGBA1C 7.3 (H) 12/19/2017    Procedures since last visit: No results found.  Assessment/Plan  1. Intermittent claudication (HCC) New symptom. Has known history of DM, ckd 3, TIA and HTN. Likely has a component of PAD contributing to this. Continue aspirin and statin - VAS [place] ABI WITH/WO TBI; Future  2. Leg wound, left, initial encounter No signs of infection, skin care, monitor for opening of wound and or drainage. Known history of diabetes. Pt agrees.  - VAS [place] ABI WITH/WO TBI; Future    Labs/tests ordered:  ABI  Next appointment: has upcoming routine visit.    Blanchie Serve, MD Internal Medicine [facility] [address] [place], [place] [address] Cell Phone (Monday-Friday 8 am - 5 pm): [phone] On Call: [phone] and follow prompts after 5 pm and on weekends Office Phone: [phone] Office Fax: [phone]

## 2018-03-08 ENCOUNTER — Other Ambulatory Visit: Payer: Self-pay | Admitting: Internal Medicine

## 2018-03-08 DIAGNOSIS — S81802A Unspecified open wound, left lower leg, initial encounter: Secondary | ICD-10-CM

## 2018-03-08 DIAGNOSIS — I739 Peripheral vascular disease, unspecified: Secondary | ICD-10-CM

## 2018-03-11 ENCOUNTER — Ambulatory Visit (HOSPITAL_COMMUNITY)
Admission: RE | Admit: 2018-03-11 | Discharge: 2018-03-11 | Disposition: A | Discharge status: Home / Self Care | Payer: Medicare Other | Source: Ambulatory Visit | Attending: Internal Medicine | Admitting: Internal Medicine

## 2018-03-11 DIAGNOSIS — I739 Peripheral vascular disease, unspecified: Secondary | ICD-10-CM | POA: Diagnosis not present

## 2018-03-11 DIAGNOSIS — S81802A Unspecified open wound, left lower leg, initial encounter: Secondary | ICD-10-CM

## 2018-03-12 ENCOUNTER — Other Ambulatory Visit: Payer: Self-pay | Admitting: Internal Medicine

## 2018-03-12 DIAGNOSIS — I739 Peripheral vascular disease, unspecified: Secondary | ICD-10-CM

## 2018-03-12 NOTE — Progress Notes (Signed)
Ordering vein and vascular referral for claudication pain with arterial doppler suggestive of moderate left and right arterial disease.

## 2018-03-13 ENCOUNTER — Other Ambulatory Visit: Payer: Self-pay

## 2018-03-13 DIAGNOSIS — E1140 Type 2 diabetes mellitus with diabetic neuropathy, unspecified: Secondary | ICD-10-CM | POA: Diagnosis not present

## 2018-03-13 DIAGNOSIS — N183 Chronic kidney disease, stage 3 unspecified: Secondary | ICD-10-CM

## 2018-03-13 DIAGNOSIS — E785 Hyperlipidemia, unspecified: Secondary | ICD-10-CM | POA: Diagnosis not present

## 2018-03-13 DIAGNOSIS — E1122 Type 2 diabetes mellitus with diabetic chronic kidney disease: Secondary | ICD-10-CM

## 2018-03-13 DIAGNOSIS — Z8673 Personal history of transient ischemic attack (TIA), and cerebral infarction without residual deficits: Secondary | ICD-10-CM

## 2018-03-13 DIAGNOSIS — Z794 Long term (current) use of insulin: Secondary | ICD-10-CM

## 2018-03-14 ENCOUNTER — Other Ambulatory Visit: Payer: Medicare Other

## 2018-03-15 DIAGNOSIS — L84 Corns and callosities: Secondary | ICD-10-CM | POA: Diagnosis not present

## 2018-03-15 DIAGNOSIS — B351 Tinea unguium: Secondary | ICD-10-CM | POA: Diagnosis not present

## 2018-03-15 DIAGNOSIS — M79672 Pain in left foot: Secondary | ICD-10-CM | POA: Diagnosis not present

## 2018-03-15 DIAGNOSIS — M79671 Pain in right foot: Secondary | ICD-10-CM | POA: Diagnosis not present

## 2018-03-15 LAB — CBC
HCT: 38.7 % (ref 38.5–50.0)
HEMOGLOBIN: 13.1 g/dL — AB (ref 13.2–17.1)
MCH: 34.2 pg — ABNORMAL HIGH (ref 27.0–33.0)
MCHC: 33.9 g/dL (ref 32.0–36.0)
MCV: 101 fL — ABNORMAL HIGH (ref 80.0–100.0)
MPV: 10 fL (ref 7.5–12.5)
PLATELETS: 343 10*3/uL (ref 140–400)
RBC: 3.83 10*6/uL — AB (ref 4.20–5.80)
RDW: 11.8 % (ref 11.0–15.0)
WBC: 6.3 10*3/uL (ref 3.8–10.8)

## 2018-03-15 LAB — COMPLETE METABOLIC PANEL WITH GFR
AG RATIO: 1.6 (calc) (ref 1.0–2.5)
ALBUMIN MSPROF: 4.1 g/dL (ref 3.6–5.1)
ALT: 26 U/L (ref 9–46)
AST: 24 U/L (ref 10–35)
Alkaline phosphatase (APISO): 63 U/L (ref 40–115)
BUN / CREAT RATIO: 15 (calc) (ref 6–22)
BUN: 19 mg/dL (ref 7–25)
CALCIUM: 9.4 mg/dL (ref 8.6–10.3)
CO2: 26 mmol/L (ref 20–32)
CREATININE: 1.31 mg/dL — AB (ref 0.70–1.11)
Chloride: 100 mmol/L (ref 98–110)
GFR, EST AFRICAN AMERICAN: 54 mL/min/{1.73_m2} — AB (ref >=60)
GFR, Est Non African American: 47 mL/min/{1.73_m2} — ABNORMAL LOW (ref >=60)
GLOBULIN: 2.5 g/dL (ref 1.9–3.7)
Glucose, Bld: 142 mg/dL — ABNORMAL HIGH (ref 65–99)
Potassium: 4.4 mmol/L (ref 3.5–5.3)
SODIUM: 137 mmol/L (ref 135–146)
TOTAL PROTEIN: 6.6 g/dL (ref 6.1–8.1)
Total Bilirubin: 0.9 mg/dL (ref 0.2–1.2)

## 2018-03-15 LAB — HEMOGLOBIN A1C
Hgb A1c MFr Bld: 7.9 % of total Hgb — ABNORMAL HIGH (ref <5.7)
Mean Plasma Glucose: 180 (calc)
eAG (mmol/L): 10 (calc)

## 2018-03-15 LAB — LIPID PANEL
CHOL/HDL RATIO: 2.7 (calc) (ref <5.0)
CHOLESTEROL: 154 mg/dL (ref <200)
HDL: 57 mg/dL (ref >40)
LDL Cholesterol (Calc): 78 mg/dL (calc)
Non-HDL Cholesterol (Calc): 97 mg/dL (calc) (ref <130)
Triglycerides: 104 mg/dL (ref <150)

## 2018-03-15 LAB — MICROALBUMIN / CREATININE URINE RATIO
CREATININE, URINE: 134 mg/dL (ref 20–320)
MICROALB UR: 1 mg/dL
MICROALB/CREAT RATIO: 7 ug/mg{creat} (ref <30)

## 2018-03-20 ENCOUNTER — Other Ambulatory Visit: Payer: Self-pay

## 2018-03-20 ENCOUNTER — Non-Acute Institutional Stay: Payer: Medicare Other | Admitting: Internal Medicine

## 2018-03-20 ENCOUNTER — Encounter: Payer: Self-pay | Admitting: Internal Medicine

## 2018-03-20 VITALS — BP 138/74 | HR 72 | Temp 97.8°F | Ht 69.0 in | Wt 141.0 lb

## 2018-03-20 DIAGNOSIS — N183 Chronic kidney disease, stage 3 unspecified: Secondary | ICD-10-CM

## 2018-03-20 DIAGNOSIS — E785 Hyperlipidemia, unspecified: Secondary | ICD-10-CM

## 2018-03-20 DIAGNOSIS — Z8673 Personal history of transient ischemic attack (TIA), and cerebral infarction without residual deficits: Secondary | ICD-10-CM

## 2018-03-20 DIAGNOSIS — E1122 Type 2 diabetes mellitus with diabetic chronic kidney disease: Secondary | ICD-10-CM

## 2018-03-20 DIAGNOSIS — I10 Essential (primary) hypertension: Secondary | ICD-10-CM

## 2018-03-20 DIAGNOSIS — I739 Peripheral vascular disease, unspecified: Secondary | ICD-10-CM | POA: Insufficient documentation

## 2018-03-20 MED ORDER — ASPIRIN 325 MG PO TBEC: 325.0000 mg | DELAYED_RELEASE_TABLET | Freq: Every day | ORAL | 0 refills | Status: DC

## 2018-03-20 NOTE — Progress Notes (Signed)
[facility]  Provider: Blanchie Serve MD   Location:  [place] of Service:  Clinic (12)  PCP: Blanchie Serve, MD Patient Care Team: Blanchie Serve, MD as PCP - General (Internal Medicine)  Extended Emergency Contact Information Primary Emergency Contact: Gordon,[place] Address: [address]          [place], [place] [address] [name] of [place] Phone: [phone] Relation: Daughter   Goals of Care: Advanced Directive information Advanced Directives 03/20/2018  Does Patient Have a Medical Advance Directive? Yes  Type of Paramedic of [place];Living will  Does patient want to make changes to medical advance directive? -  Copy of [place] in Chart? Yes      Chief Complaint  Patient presents with  . Medical Management of Chronic Issues    6 month routine visit  . Audit C Screening    score of o    HPI: Patient is a 84 y.o. male seen today for routine visit.   Type 2 diabetes mellitus- takes metformin 1000 mg daily. Also on losartan. Has not been checking blood sugar at home. No fall reported.   OA- takes tylenol as needed. Takes calcium and vitamin d supplement.   Hypertension- takes Losartan 50 mg daily and tolerating it well  Hyperlipidemia- takes Simvastatin 10 mg daily, denies myalgia  PAD- reviewed ABI, pt seen by vascular and has follow up next week for further evaluation and rx plan  Past Medical History:  Diagnosis Date  . BPH (benign prostatic hyperplasia) 10/14/2014  . Cervicalgia 01/04/2016  . Contusion of left shoulder 05/27/14  . DM type 2 (diabetes mellitus, type 2) ([place])   . High blood pressure   . History of basal cell cancer 11/21/2005   Right temple  . History of colonic polyps 10/14/2003  . Hyperlipidemia   . Left knee pain 05/27/14  . SAH (subarachnoid hemorrhage) ([place]) 2009   TBI  . TIA (transient ischemic attack) 11/06/2005   Left eye pain. Slurred  speech. Diaphoresis. No residual effects.    Past Surgical History:  Procedure Laterality Date  . Achilles tendon repair Left 1998   ruptured tendon  . COLONOSCOPY  1992   Polyp removed  . COLONOSCOPY  1998   normal  . ORIF FEMUR FRACTURE Right 1929  . TONSILLECTOMY      reports that he quit smoking about 46 years ago. He has never used smokeless tobacco. He reports that he drinks about 1.0 - 2.0 standard drinks of alcohol per week. He reports that he does not use drugs. Social History   Socioeconomic History  . Marital status: Married    Spouse name: Not on file  . Number of children: Not on file  . Years of education: Not on file  . Highest education level: Not on file  Occupational History  . Occupation: retired Copy  . Financial resource strain: Not on file  . Food insecurity:    Worry: Not on file    Inability: Not on file  . Transportation needs:    Medical: Not on file    Non-medical: Not on file  Tobacco Use  . Smoking status: Former Smoker    Last attempt to quit: 06/26/1971    Years since quitting: 46.7  . Smokeless tobacco: Never Used  . Tobacco comment: 2 1/2 packs a day, until 1972  Substance and Sexual Activity  . Alcohol use: Yes  Alcohol/week: 1.0 - 2.0 standard drinks    Types: 1 - 2 Glasses of wine per week    Comment: Wine nightly  . Drug use: No  . Sexual activity: Not on file  Lifestyle  . Physical activity:    Days per week: Not on file    Minutes per session: Not on file  . Stress: Not on file  Relationships  . Social connections:    Talks on phone: Not on file    Gets together: Not on file    Attends religious service: Not on file    Active member of club or organization: Not on file    Attends meetings of clubs or organizations: Not on file    Relationship status: Not on file  . Intimate partner violence:    Fear of current or ex partner: Not on file    Emotionally abused: Not on file    Physically abused: Not  on file    Forced sexual activity: Not on file  Other Topics Concern  . Not on file  Social History Narrative   Patient [facility] at [facility] since Dec 2015   Caffeine- Coffee   Married- Yes, 1951   [employer] [employer] 1943-46   House- Apartment with 2 people   Pets- No   Current/past profession- Press photographer   Exercise- Yes, walking   Living will- Yes   DNR- Yes   POA/HPOA- Yes       Functional Status Survey:    Family History  Problem Relation Age of Onset  . Heart attack Mother   . Heart disease Mother   . Heart disease Father     Health Maintenance  Topic Date Due  . OPHTHALMOLOGY EXAM  07/07/2017  . INFLUENZA VACCINE  04/25/2018 (Originally 01/24/2018)  . FOOT EXAM  06/27/2018  . HEMOGLOBIN A1C  09/11/2018  . TETANUS/TDAP  04/29/2027  . PNA vac Low Risk Adult  Completed    No Known Allergies  Outpatient Encounter Medications as of 03/20/2018  Medication Sig  . acetaminophen (TYLENOL 8 HOUR) 650 MG CR tablet Take 1 tablet (650 mg total) by mouth every 8 (eight) hours as needed for pain.  . Blood Glucose Monitoring Suppl (ONE TOUCH ULTRA SYSTEM KIT) w/Device KIT Use to test blood sugar twice daily. Dx E11.9  . calcium carbonate 1250 MG capsule Take 600 mg by mouth daily.  . Carboxymethylcellulose Sodium (EYE DROPS OP) Place 2 drops into both eyes daily as needed (dry eyes).   . Cholecalciferol 1000 UNITS tablet Take 1,000 Units by mouth daily.  Marland Kitchen glucose blood test strip One Touch Ultra Test Strip Sig:Use to test blood sugar twice daily. Dx E11.9  . Lancets (UNILET COMFORTOUCH LANCET) MISC Use to test blood sugar twice daily. Dx: E11.9  . losartan (COZAAR) 50 MG tablet Take 1 tablet (50 mg total) by mouth daily.  . metFORMIN (GLUCOPHAGE) 1000 MG tablet TAKE ONE TABLET BY MOUTH EVERY MORNING TO CONTROL DIABETES  . simvastatin (ZOCOR) 10 MG tablet Take 1 tablet (10 mg total) by mouth at bedtime.  . [DISCONTINUED] aspirin 325 MG EC tablet Take 325 mg by mouth daily.   No  facility-administered encounter medications on file as of 03/20/2018.     Review of Systems  Constitutional: Negative for appetite change, chills, fatigue and fever.  HENT: Negative for congestion, ear pain, mouth sores, postnasal drip, rhinorrhea, sinus pressure, sinus pain, sore throat and trouble swallowing.   Eyes: Positive for visual disturbance.  Respiratory: Negative  for cough, shortness of breath and wheezing.   Cardiovascular: Negative for chest pain and palpitations.  Gastrointestinal: Negative for abdominal pain, blood in stool, constipation, diarrhea, nausea and vomiting.  Genitourinary: Negative for dysuria, flank pain and hematuria.  Musculoskeletal: Positive for arthralgias. Negative for gait problem and myalgias.  Skin: Negative for rash.  Neurological: Negative for dizziness, seizures, syncope, light-headedness and headaches.  Psychiatric/Behavioral: Negative for behavioral problems and decreased concentration.    Vitals:   03/20/18 1135  BP: 138/74  Pulse: 72  Temp: 97.8 F (36.6 C)  TempSrc: Oral  SpO2: 98%  Weight: 141 lb (64 kg)  Height: '5\' 9"'$  (1.753 m)   Body mass index is 20.82 kg/m.   Wt Readings from Last 3 Encounters:  03/20/18 141 lb (64 kg)  03/06/18 145 lb 3.2 oz (65.9 kg)  10/09/17 141 lb (64 kg)   Physical Exam  Constitutional: He is oriented to person, place, and time. He appears well-developed and well-nourished. No distress.  HENT:  Head: Normocephalic and atraumatic.  Nose: Nose normal.  Mouth/Throat: Oropharynx is clear and moist. No oropharyngeal exudate.  Eyes: Pupils are equal, round, and reactive to light. Conjunctivae and EOM are normal. Right eye exhibits no discharge. Left eye exhibits no discharge.  Neck: Normal range of motion. Neck supple.  Cardiovascular: Normal rate and regular rhythm.  Pulmonary/Chest: Effort normal and breath sounds normal. He has no wheezes. He has no rales.  Abdominal: Soft. Bowel sounds are normal.  There is no tenderness. There is no guarding.  Musculoskeletal: Normal range of motion. He exhibits no edema or tenderness.  Lymphadenopathy:    He has no cervical adenopathy.  Neurological: He is alert and oriented to person, place, and time. He exhibits normal muscle tone.  Skin: Skin is warm and dry. He is not diaphoretic.  Varicose veins  Psychiatric: He has a normal mood and affect. His behavior is normal.    Labs reviewed: Basic Metabolic Panel: Recent Labs    08/16/17 0000 09/10/17 0750 03/13/18 0000  NA 139 138 137  K 4.5 4.7 4.4  CL 105 103 100  CO2 '27 25 26  '$ GLUCOSE 155* 130* 142*  BUN '18 20 19  '$ CREATININE 1.21* 1.40* 1.31*  CALCIUM 9.4 9.4 9.4   Liver Function Tests: Recent Labs    08/16/17 0000 09/10/17 0750 03/13/18 0000  AST '19 18 24  '$ ALT '17 21 26  '$ BILITOT 0.8 0.7 0.9  PROT 6.3 6.5 6.6   No results for input(s): LIPASE, AMYLASE in the last 8760 hours. No results for input(s): AMMONIA in the last 8760 hours. CBC: Recent Labs    08/16/17 0000 09/10/17 0750 03/13/18 0000  WBC 6.5 6.6 6.3  HGB 13.1* 13.5 13.1*  HCT 38.3* 38.8 38.7  MCV 100.3* 97.2 101.0*  PLT 339 384 343   Cardiac Enzymes: No results for input(s): CKTOTAL, CKMB, CKMBINDEX, TROPONINI in the last 8760 hours. BNP: Invalid input(s): POCBNP Lab Results  Component Value Date   HGBA1C 7.9 (H) 03/13/2018   No results found for: TSH Lab Results  Component Value Date   VITAMINB12 1,226 (H) 09/10/2017   No results found for: FOLATE No results found for: IRON, TIBC, FERRITIN  Lipid Panel: Recent Labs    08/16/17 0000 09/10/17 0750 03/13/18 0000  CHOL 164 153 154  HDL 58 51 57  LDLCALC 87 83 78  TRIG 93 91 104  CHOLHDL 2.8 3.0 2.7   Lab Results  Component Value Date   HGBA1C 7.9 (  H) 03/13/2018    Procedures since last visit: No results found.  Assessment/Plan  1. Hypertension, essential Continue losartan 50 mg daily and monitor  2. Controlled type 2 diabetes  mellitus with stage 3 chronic kidney disease, without long-term current use of insulin ([place]) Reviewed a1c, elevated from before. Continue metformin 1000 mg daily. Advised to cut down on his sweet and soda (pt mentions he has been taking more of these lately). Continue ARB.  3. CKD (chronic kidney disease) stage 3, GFR 30-59 ml/min (HCC) Continue losartan, monitor bmp periodically  4. Hyperlipidemia LDL goal <70 Continue simvastatin 10 mg daily. Lipid panel reviewed.   5. History of TIA (transient ischemic attack) Continue aspirin and statin.   6. PAD (peripheral artery disease) ([place]) To follow with vascular surgery team for treatment plan. Continue aspirin and statin for now   Labs/tests ordered:  cmp with eGFR, A1C in 3 months   Next appointment: 3 months   Communication: reviewed care plan with patient    Blanchie Serve, MD Internal Medicine [place], [place] [address] Cell Phone (Monday-Friday 8 am - 5 pm): [phone] On Call: [phone] and follow prompts after 5 pm and on weekends Office Phone: [phone] Office Fax: [phone]

## 2018-03-22 ENCOUNTER — Ambulatory Visit (HOSPITAL_COMMUNITY)
Admission: RE | Admit: 2018-03-22 | Discharge: 2018-03-22 | Disposition: A | Discharge status: Home / Self Care | Payer: Medicare Other | Source: Ambulatory Visit | Attending: Cardiology | Admitting: Cardiology

## 2018-03-22 DIAGNOSIS — S81802A Unspecified open wound, left lower leg, initial encounter: Secondary | ICD-10-CM | POA: Diagnosis not present

## 2018-03-22 DIAGNOSIS — I739 Peripheral vascular disease, unspecified: Secondary | ICD-10-CM | POA: Insufficient documentation

## 2018-03-26 ENCOUNTER — Other Ambulatory Visit: Payer: Self-pay | Admitting: Internal Medicine

## 2018-03-26 DIAGNOSIS — E119 Type 2 diabetes mellitus without complications: Secondary | ICD-10-CM

## 2018-03-27 ENCOUNTER — Encounter: Payer: Self-pay | Admitting: Internal Medicine

## 2018-04-01 ENCOUNTER — Emergency Department (HOSPITAL_COMMUNITY): Payer: Medicare Other

## 2018-04-01 ENCOUNTER — Other Ambulatory Visit: Payer: Self-pay

## 2018-04-01 ENCOUNTER — Encounter (HOSPITAL_COMMUNITY): Payer: Self-pay | Admitting: Emergency Medicine

## 2018-04-01 ENCOUNTER — Inpatient Hospital Stay (HOSPITAL_COMMUNITY)
Admission: EM | Admit: 2018-04-01 | Discharge: 2018-04-09 | DRG: 061 | Disposition: A | Discharge status: Skilled Nursing Facility | Payer: Medicare Other | Attending: Neurology | Admitting: Neurology

## 2018-04-01 DIAGNOSIS — R479 Unspecified speech disturbances: Secondary | ICD-10-CM | POA: Diagnosis present

## 2018-04-01 DIAGNOSIS — R29705 NIHSS score 5: Secondary | ICD-10-CM | POA: Diagnosis present

## 2018-04-01 DIAGNOSIS — I454 Nonspecific intraventricular block: Secondary | ICD-10-CM | POA: Diagnosis present

## 2018-04-01 DIAGNOSIS — I350 Nonrheumatic aortic (valve) stenosis: Secondary | ICD-10-CM | POA: Diagnosis present

## 2018-04-01 DIAGNOSIS — I351 Nonrheumatic aortic (valve) insufficiency: Secondary | ICD-10-CM | POA: Diagnosis not present

## 2018-04-01 DIAGNOSIS — E1159 Type 2 diabetes mellitus with other circulatory complications: Secondary | ICD-10-CM

## 2018-04-01 DIAGNOSIS — N183 Chronic kidney disease, stage 3 unspecified: Secondary | ICD-10-CM | POA: Diagnosis present

## 2018-04-01 DIAGNOSIS — R402253 Coma scale, best verbal response, oriented, at hospital admission: Secondary | ICD-10-CM | POA: Diagnosis present

## 2018-04-01 DIAGNOSIS — E1122 Type 2 diabetes mellitus with diabetic chronic kidney disease: Secondary | ICD-10-CM | POA: Diagnosis present

## 2018-04-01 DIAGNOSIS — N400 Enlarged prostate without lower urinary tract symptoms: Secondary | ICD-10-CM | POA: Diagnosis present

## 2018-04-01 DIAGNOSIS — Z7982 Long term (current) use of aspirin: Secondary | ICD-10-CM

## 2018-04-01 DIAGNOSIS — I63212 Cerebral infarction due to unspecified occlusion or stenosis of left vertebral arteries: Secondary | ICD-10-CM | POA: Diagnosis not present

## 2018-04-01 DIAGNOSIS — I6389 Other cerebral infarction: Secondary | ICD-10-CM | POA: Diagnosis not present

## 2018-04-01 DIAGNOSIS — E1165 Type 2 diabetes mellitus with hyperglycemia: Secondary | ICD-10-CM | POA: Diagnosis not present

## 2018-04-01 DIAGNOSIS — R402363 Coma scale, best motor response, obeys commands, at hospital admission: Secondary | ICD-10-CM | POA: Diagnosis present

## 2018-04-01 DIAGNOSIS — R410 Disorientation, unspecified: Secondary | ICD-10-CM | POA: Diagnosis present

## 2018-04-01 DIAGNOSIS — I480 Paroxysmal atrial fibrillation: Secondary | ICD-10-CM | POA: Diagnosis present

## 2018-04-01 DIAGNOSIS — G8929 Other chronic pain: Secondary | ICD-10-CM | POA: Diagnosis not present

## 2018-04-01 DIAGNOSIS — I739 Peripheral vascular disease, unspecified: Secondary | ICD-10-CM | POA: Diagnosis present

## 2018-04-01 DIAGNOSIS — I129 Hypertensive chronic kidney disease with stage 1 through stage 4 chronic kidney disease, or unspecified chronic kidney disease: Secondary | ICD-10-CM | POA: Diagnosis present

## 2018-04-01 DIAGNOSIS — R1310 Dysphagia, unspecified: Secondary | ICD-10-CM | POA: Diagnosis present

## 2018-04-01 DIAGNOSIS — R739 Hyperglycemia, unspecified: Secondary | ICD-10-CM | POA: Diagnosis not present

## 2018-04-01 DIAGNOSIS — Z7984 Long term (current) use of oral hypoglycemic drugs: Secondary | ICD-10-CM

## 2018-04-01 DIAGNOSIS — I609 Nontraumatic subarachnoid hemorrhage, unspecified: Secondary | ICD-10-CM | POA: Diagnosis not present

## 2018-04-01 DIAGNOSIS — I10 Essential (primary) hypertension: Secondary | ICD-10-CM

## 2018-04-01 DIAGNOSIS — Z8673 Personal history of transient ischemic attack (TIA), and cerebral infarction without residual deficits: Secondary | ICD-10-CM

## 2018-04-01 DIAGNOSIS — R471 Dysarthria and anarthria: Secondary | ICD-10-CM | POA: Diagnosis not present

## 2018-04-01 DIAGNOSIS — R1312 Dysphagia, oropharyngeal phase: Secondary | ICD-10-CM | POA: Diagnosis not present

## 2018-04-01 DIAGNOSIS — M25511 Pain in right shoulder: Secondary | ICD-10-CM | POA: Diagnosis not present

## 2018-04-01 DIAGNOSIS — M542 Cervicalgia: Secondary | ICD-10-CM | POA: Diagnosis present

## 2018-04-01 DIAGNOSIS — R270 Ataxia, unspecified: Secondary | ICD-10-CM | POA: Diagnosis present

## 2018-04-01 DIAGNOSIS — Z87820 Personal history of traumatic brain injury: Secondary | ICD-10-CM

## 2018-04-01 DIAGNOSIS — T17908A Unspecified foreign body in respiratory tract, part unspecified causing other injury, initial encounter: Secondary | ICD-10-CM | POA: Diagnosis not present

## 2018-04-01 DIAGNOSIS — E785 Hyperlipidemia, unspecified: Secondary | ICD-10-CM | POA: Diagnosis present

## 2018-04-01 DIAGNOSIS — I639 Cerebral infarction, unspecified: Secondary | ICD-10-CM

## 2018-04-01 DIAGNOSIS — H5500 Unspecified nystagmus: Secondary | ICD-10-CM | POA: Diagnosis present

## 2018-04-01 DIAGNOSIS — N179 Acute kidney failure, unspecified: Secondary | ICD-10-CM | POA: Diagnosis not present

## 2018-04-01 DIAGNOSIS — D72829 Elevated white blood cell count, unspecified: Secondary | ICD-10-CM | POA: Diagnosis present

## 2018-04-01 DIAGNOSIS — Z79899 Other long term (current) drug therapy: Secondary | ICD-10-CM

## 2018-04-01 DIAGNOSIS — G8194 Hemiplegia, unspecified affecting left nondominant side: Secondary | ICD-10-CM | POA: Diagnosis present

## 2018-04-01 DIAGNOSIS — E1151 Type 2 diabetes mellitus with diabetic peripheral angiopathy without gangrene: Secondary | ICD-10-CM | POA: Diagnosis present

## 2018-04-01 DIAGNOSIS — I6300 Cerebral infarction due to thrombosis of unspecified precerebral artery: Secondary | ICD-10-CM | POA: Diagnosis not present

## 2018-04-01 DIAGNOSIS — R402143 Coma scale, eyes open, spontaneous, at hospital admission: Secondary | ICD-10-CM | POA: Diagnosis present

## 2018-04-01 DIAGNOSIS — Z87891 Personal history of nicotine dependence: Secondary | ICD-10-CM

## 2018-04-01 DIAGNOSIS — R490 Dysphonia: Secondary | ICD-10-CM | POA: Diagnosis present

## 2018-04-01 LAB — GLUCOSE, CAPILLARY
Glucose-Capillary: 210 mg/dL — ABNORMAL HIGH (ref 70–99)
Glucose-Capillary: 209 mg/dL — ABNORMAL HIGH (ref 70–99)

## 2018-04-01 LAB — I-STAT CHEM 8, ED
BUN: 20 mg/dL (ref 8–23)
CALCIUM ION: 1.18 mmol/L (ref 1.15–1.40)
CREATININE: 1.5 mg/dL — AB (ref 0.61–1.24)
Chloride: 104 mmol/L (ref 98–111)
Glucose, Bld: 162 mg/dL — ABNORMAL HIGH (ref 70–99)
HCT: 36 % — ABNORMAL LOW (ref 39.0–52.0)
Hemoglobin: 12.2 g/dL — ABNORMAL LOW (ref 13.0–17.0)
Potassium: 4.2 mmol/L (ref 3.5–5.1)
Sodium: 137 mmol/L (ref 135–145)
TCO2: 23 mmol/L (ref 22–32)

## 2018-04-01 LAB — MRSA PCR SCREENING: MRSA by PCR: NEGATIVE

## 2018-04-01 LAB — I-STAT TROPONIN, ED: TROPONIN I, POC: 0.01 ng/mL (ref 0.00–0.08)

## 2018-04-01 LAB — COMPREHENSIVE METABOLIC PANEL
ALT: 19 U/L (ref 0–44)
AST: 25 U/L (ref 15–41)
Albumin: 3.7 g/dL (ref 3.5–5.0)
Alkaline Phosphatase: 57 U/L (ref 38–126)
Anion gap: 10 (ref 5–15)
BILIRUBIN TOTAL: 0.9 mg/dL (ref 0.3–1.2)
BUN: 19 mg/dL (ref 8–23)
CALCIUM: 9.4 mg/dL (ref 8.9–10.3)
CO2: 22 mmol/L (ref 22–32)
CREATININE: 1.51 mg/dL — AB (ref 0.61–1.24)
Chloride: 103 mmol/L (ref 98–111)
GFR calc Af Amer: 44 mL/min — ABNORMAL LOW (ref >60)
GFR, EST NON AFRICAN AMERICAN: 38 mL/min — AB (ref >60)
Glucose, Bld: 161 mg/dL — ABNORMAL HIGH (ref 70–99)
POTASSIUM: 4.2 mmol/L (ref 3.5–5.1)
Sodium: 135 mmol/L (ref 135–145)
TOTAL PROTEIN: 6.6 g/dL (ref 6.5–8.1)

## 2018-04-01 LAB — DIFFERENTIAL
ABS IMMATURE GRANULOCYTES: 0 10*3/uL (ref 0.0–0.1)
Basophils Absolute: 0.1 10*3/uL (ref 0.0–0.1)
Basophils Relative: 1 %
EOS ABS: 0.1 10*3/uL (ref 0.0–0.7)
Eosinophils Relative: 2 %
Immature Granulocytes: 1 %
Lymphocytes Relative: 17 %
Lymphs Abs: 1.5 10*3/uL (ref 0.7–4.0)
MONOS PCT: 9 %
Monocytes Absolute: 0.8 10*3/uL (ref 0.1–1.0)
Neutro Abs: 6.3 10*3/uL (ref 1.7–7.7)
Neutrophils Relative %: 70 %

## 2018-04-01 LAB — APTT: aPTT: 27 seconds (ref 24–36)

## 2018-04-01 LAB — CBC
HEMATOCRIT: 38.4 % — AB (ref 39.0–52.0)
HEMOGLOBIN: 12.7 g/dL — AB (ref 13.0–17.0)
MCH: 34.7 pg — AB (ref 26.0–34.0)
MCHC: 33.1 g/dL (ref 30.0–36.0)
MCV: 104.9 fL — AB (ref 78.0–100.0)
PLATELETS: 317 10*3/uL (ref 150–400)
RBC: 3.66 MIL/uL — AB (ref 4.22–5.81)
RDW: 12.8 % (ref 11.5–15.5)
WBC: 8.9 10*3/uL (ref 4.0–10.5)

## 2018-04-01 LAB — CBG MONITORING, ED: Glucose-Capillary: 173 mg/dL — ABNORMAL HIGH (ref 70–99)

## 2018-04-01 LAB — PROTIME-INR
INR: 1.06
Prothrombin Time: 13.7 seconds (ref 11.4–15.2)

## 2018-04-01 MED ORDER — METHYLPREDNISOLONE SODIUM SUCC 125 MG IJ SOLR
INTRAMUSCULAR | Status: AC
  Administered 2018-04-01: 125 mg
  Filled 2018-04-01: qty 2

## 2018-04-01 MED ORDER — ACETAMINOPHEN 160 MG/5ML PO SOLN: 650.0000 mg | ORAL | Status: DC | PRN

## 2018-04-01 MED ORDER — ACETAMINOPHEN 325 MG PO TABS: 650.0000 mg | ORAL_TABLET | ORAL | Status: DC | PRN

## 2018-04-01 MED ORDER — IOPAMIDOL (ISOVUE-370) INJECTION 76%
100.0000 mL | Freq: Once | INTRAVENOUS | Status: AC | PRN
  Administered 2018-04-01: 50 mL via INTRAVENOUS

## 2018-04-01 MED ORDER — STROKE: EARLY STAGES OF RECOVERY BOOK
Freq: Once | Status: AC
  Administered 2018-04-01: 21:00:00
  Filled 2018-04-01: qty 1

## 2018-04-01 MED ORDER — LABETALOL HCL 5 MG/ML IV SOLN: 10.0000 mg | INTRAVENOUS | Status: DC | PRN

## 2018-04-01 MED ORDER — SODIUM CHLORIDE 0.9 % IV SOLN: 50.0000 mL | Freq: Once | INTRAVENOUS | Status: DC

## 2018-04-01 MED ORDER — FAMOTIDINE IN NACL 20-0.9 MG/50ML-% IV SOLN: 20.0000 mg | INTRAVENOUS | Status: AC

## 2018-04-01 MED ORDER — ORAL CARE MOUTH RINSE
15.0000 mL | Freq: Two times a day (BID) | OROMUCOSAL | Status: DC
  Administered 2018-04-01 – 2018-04-08 (×15): 15 mL via OROMUCOSAL

## 2018-04-01 MED ORDER — INSULIN ASPART 100 UNIT/ML ~~LOC~~ SOLN
0.0000 [IU] | SUBCUTANEOUS | Status: DC
  Administered 2018-04-01: 3 [IU] via SUBCUTANEOUS
  Administered 2018-04-02: 2 [IU] via SUBCUTANEOUS
  Administered 2018-04-02: 3 [IU] via SUBCUTANEOUS
  Administered 2018-04-02: 2 [IU] via SUBCUTANEOUS
  Administered 2018-04-02: 3 [IU] via SUBCUTANEOUS
  Administered 2018-04-02: 2 [IU] via SUBCUTANEOUS
  Administered 2018-04-03: 1 [IU] via SUBCUTANEOUS
  Administered 2018-04-03: 2 [IU] via SUBCUTANEOUS
  Administered 2018-04-03 (×3): 1 [IU] via SUBCUTANEOUS
  Administered 2018-04-04: 2 [IU] via SUBCUTANEOUS
  Administered 2018-04-04: 1 [IU] via SUBCUTANEOUS
  Administered 2018-04-05: 3 [IU] via SUBCUTANEOUS
  Administered 2018-04-05 (×3): 1 [IU] via SUBCUTANEOUS
  Administered 2018-04-06: 2 [IU] via SUBCUTANEOUS
  Administered 2018-04-06: 1 [IU] via SUBCUTANEOUS
  Administered 2018-04-06: 3 [IU] via SUBCUTANEOUS
  Administered 2018-04-06: 1 [IU] via SUBCUTANEOUS
  Administered 2018-04-06 – 2018-04-07 (×3): 2 [IU] via SUBCUTANEOUS
  Administered 2018-04-07: 1 [IU] via SUBCUTANEOUS
  Administered 2018-04-07: 2 [IU] via SUBCUTANEOUS
  Administered 2018-04-07 (×2): 1 [IU] via SUBCUTANEOUS
  Administered 2018-04-07: 2 [IU] via SUBCUTANEOUS
  Administered 2018-04-08: 1 [IU] via SUBCUTANEOUS
  Administered 2018-04-08: 3 [IU] via SUBCUTANEOUS
  Administered 2018-04-08: 2 [IU] via SUBCUTANEOUS
  Administered 2018-04-08: 1 [IU] via SUBCUTANEOUS
  Administered 2018-04-08 – 2018-04-09 (×2): 2 [IU] via SUBCUTANEOUS
  Administered 2018-04-09: 1 [IU] via SUBCUTANEOUS
  Administered 2018-04-09: 2 [IU] via SUBCUTANEOUS

## 2018-04-01 MED ORDER — ACETAMINOPHEN 650 MG RE SUPP: 650.0000 mg | RECTAL | Status: DC | PRN

## 2018-04-01 MED ORDER — SODIUM CHLORIDE 0.9 % IV SOLN
INTRAVENOUS | Status: DC
  Administered 2018-04-01 – 2018-04-08 (×9): via INTRAVENOUS

## 2018-04-01 MED ORDER — CHLORHEXIDINE GLUCONATE 0.12 % MT SOLN
15.0000 mL | Freq: Two times a day (BID) | OROMUCOSAL | Status: DC
  Administered 2018-04-02 – 2018-04-08 (×13): 15 mL via OROMUCOSAL
  Filled 2018-04-01 (×12): qty 15

## 2018-04-01 MED ORDER — INSULIN ASPART 100 UNIT/ML ~~LOC~~ SOLN: 0.0000 [IU] | Freq: Every day | SUBCUTANEOUS | Status: DC

## 2018-04-01 MED ORDER — METHYLPREDNISOLONE SODIUM SUCC 125 MG IJ SOLR: 125.0000 mg | INTRAMUSCULAR | Status: AC

## 2018-04-01 MED ORDER — DIPHENHYDRAMINE HCL 50 MG/ML IJ SOLN
INTRAMUSCULAR | Status: AC
  Filled 2018-04-01: qty 1

## 2018-04-01 MED ORDER — DIPHENHYDRAMINE HCL 50 MG/ML IJ SOLN
25.0000 mg | Freq: Once | INTRAMUSCULAR | Status: AC
  Administered 2018-04-01: 25 mg via INTRAVENOUS

## 2018-04-01 MED ORDER — ALTEPLASE (STROKE) FULL DOSE INFUSION
0.9000 mg/kg | Freq: Once | INTRAVENOUS | Status: AC
  Administered 2018-04-01: 60.5 mg via INTRAVENOUS
  Filled 2018-04-01: qty 100

## 2018-04-01 MED ORDER — INSULIN ASPART 100 UNIT/ML ~~LOC~~ SOLN: 0.0000 [IU] | Freq: Three times a day (TID) | SUBCUTANEOUS | Status: DC

## 2018-04-01 MED ORDER — CLEVIDIPINE BUTYRATE 0.5 MG/ML IV EMUL: 0.0000 mg/h | INTRAVENOUS | Status: DC

## 2018-04-01 MED ORDER — FAMOTIDINE IN NACL 20-0.9 MG/50ML-% IV SOLN
20.0000 mg | Freq: Two times a day (BID) | INTRAVENOUS | Status: DC
  Administered 2018-04-01 – 2018-04-08 (×14): 20 mg via INTRAVENOUS
  Filled 2018-04-01 (×14): qty 50

## 2018-04-01 NOTE — ED Provider Notes (Signed)
[facility] EMERGENCY DEPARTMENT Provider Note   CSN: [id number] Arrival date & time: 04/01/18  1429   An emergency department physician performed an initial assessment on this suspected stroke patient at 1430.  History   Chief Complaint Chief Complaint  Patient presents with  . Code Stroke    HPI WOOD NOVACEK is a 84 y.o. male.  Patient with acute onset dysarthria, trouble swallowing, left side clumsiness onset around 1345 today. Was 'normal/at baseline' immediately prior to that time. Symptoms moderate, persistent, constant, acute onset. No headache. No trouble breathing. Denies chest pain.   The history is provided by the patient, the spouse, a relative and the EMS personnel.    Past Medical History:  Diagnosis Date  . BPH (benign prostatic hyperplasia) 10/14/2014  . Cervicalgia 01/04/2016  . Contusion of left shoulder 05/27/14  . DM type 2 (diabetes mellitus, type 2) ([place])   . High blood pressure   . History of basal cell cancer 11/21/2005   Right temple  . History of colonic polyps 10/14/2003  . Hyperlipidemia   . Left knee pain 05/27/14  . SAH (subarachnoid hemorrhage) ([place]) 2009   TBI  . TIA (transient ischemic attack) 11/06/2005   Left eye pain. Slurred speech. Diaphoresis. No residual effects.     Patient Active Problem List   Diagnosis Date Noted  . Stroke ([place]) 04/01/2018  . PAD (peripheral artery disease) ([place]) 03/20/2018  . Generalized osteoarthritis 06/27/2017  . B12 deficiency 06/27/2017  . History of TIA (transient ischemic attack) 03/21/2017  . Vitamin D deficiency 03/21/2017  . Dry eyes, bilateral 03/21/2017  . CKD (chronic kidney disease) stage 3, GFR 30-59 ml/min (HCC) 03/21/2017  . Cervicalgia 01/04/2016  . Pain in joint, shoulder region 05/11/2015  . BPH (benign prostatic hyperplasia) 10/14/2014  . Hyperlipidemia LDL goal <70   . Hypertension, essential   . Controlled type 2 diabetes mellitus with stage 3 chronic kidney  disease, without long-term current use of insulin ([place])   . Left knee pain 05/27/2014  . Contusion of left shoulder 05/27/2014  . TIA (transient ischemic attack) 11/06/2005  . History of colonic polyps 10/14/2003    Past Surgical History:  Procedure Laterality Date  . Achilles tendon repair Left 1998   ruptured tendon  . COLONOSCOPY  1992   Polyp removed  . COLONOSCOPY  1998   normal  . ORIF FEMUR FRACTURE Right 1929  . TONSILLECTOMY          Home Medications    Prior to Admission medications   Medication Sig Start Date End Date Taking? Authorizing Provider  acetaminophen (TYLENOL 8 HOUR) 650 MG CR tablet Take 1 tablet (650 mg total) by mouth every 8 (eight) hours as needed for pain. 09/19/17   Blanchie Serve, MD  aspirin 325 MG EC tablet Take 1 tablet (325 mg total) by mouth daily. 03/20/18   Blanchie Serve, MD  Blood Glucose Monitoring Suppl (ONE TOUCH ULTRA SYSTEM KIT) w/Device KIT Use to test blood sugar twice daily. Dx E11.9 06/14/16   Estill Dooms, MD  calcium carbonate 1250 MG capsule Take 600 mg by mouth daily.    [provider]  Carboxymethylcellulose Sodium (EYE DROPS OP) Place 2 drops into both eyes daily as needed (dry eyes).     [provider]  Cholecalciferol 1000 UNITS tablet Take 1,000 Units by mouth daily.    [provider]  glucose blood test strip One Touch Ultra Test Strip Sig:Use to test blood sugar  twice daily. Dx E11.9 07/28/16   Estill Dooms, MD  Lancets [facility] LANCET) MISC Use to test blood sugar twice daily. Dx: E11.9 09/13/16   Estill Dooms, MD  losartan (COZAAR) 50 MG tablet Take 1 tablet (50 mg total) by mouth daily. 08/10/17   Blanchie Serve, MD  metFORMIN (GLUCOPHAGE) 1000 MG tablet TAKE ONE TABLET BY MOUTH EVERY MORNING TO CONTROL DIABETES 03/26/18   Ngetich, Dinah C, NP  simvastatin (ZOCOR) 10 MG tablet Take 1 tablet (10 mg total) by mouth at bedtime. 03/21/17   Blanchie Serve, MD    Family  History Family History  Problem Relation Age of Onset  . Heart attack Mother   . Heart disease Mother   . Heart disease Father     Social History Social History   Tobacco Use  . Smoking status: Former Smoker    Last attempt to quit: 06/26/1971    Years since quitting: 46.7  . Smokeless tobacco: Never Used  . Tobacco comment: 2 1/2 packs a day, until 1972  Substance Use Topics  . Alcohol use: Yes    Alcohol/week: 1.0 - 2.0 standard drinks    Types: 1 - 2 Glasses of wine per week    Comment: Wine nightly  . Drug use: No     Allergies   Patient has no known allergies.   Review of Systems Review of Systems  Constitutional: Negative for fever.  HENT: Positive for trouble swallowing. Negative for sore throat.   Eyes: Negative for redness.  Respiratory: Negative for shortness of breath.   Cardiovascular: Negative for chest pain and leg swelling.  Gastrointestinal: Negative for abdominal pain and vomiting.  Genitourinary: Negative for flank pain.  Musculoskeletal: Negative for back pain and neck pain.  Skin: Negative for rash.  Neurological: Positive for speech difficulty. Negative for headaches.  Hematological: Does not bruise/bleed easily.  Psychiatric/Behavioral: Negative for confusion.     Physical Exam Updated Vital Signs BP (!) 141/67   Pulse 84   Temp (!) 96.5 F (35.8 C) (Temporal)   Resp (!) 24   Wt 67.2 kg   SpO2 97%   BMI 21.88 kg/m   Physical Exam  Constitutional: He appears well-developed and well-nourished.  HENT:  Head: Atraumatic.  Mouth/Throat: Oropharynx is clear and moist.  Eyes: Pupils are equal, round, and reactive to light. Conjunctivae are normal.  Neck: Neck supple. No tracheal deviation present.  No bruits.   Cardiovascular: Normal rate, regular rhythm, normal heart sounds and intact distal pulses.  Pulmonary/Chest: Effort normal and breath sounds normal. No accessory muscle usage. No respiratory distress.  Abdominal: Soft. Bowel  sounds are normal. He exhibits no distension. There is no tenderness.  Musculoskeletal: He exhibits no edema or tenderness.  Neurological: He is alert.  Speech dysarthric. Trouble swallowing. Left arm/leg ataxia/clumsiness. Right facial droop.   Skin: Skin is warm and dry. No rash noted.  Psychiatric: He has a normal mood and affect.  Nursing note and vitals reviewed.    ED Treatments / Results  Labs (all labs ordered are listed, but only abnormal results are displayed) Results for orders placed or performed during the hospital encounter of 04/01/18  Protime-INR  Result Value Ref Range   Prothrombin Time 13.7 11.4 - 15.2 seconds   INR 1.06   APTT  Result Value Ref Range   aPTT 27 24 - 36 seconds  CBC  Result Value Ref Range   WBC 8.9 4.0 - 10.5 K/uL   RBC 3.66 (  L) 4.22 - 5.81 MIL/uL   Hemoglobin 12.7 (L) 13.0 - 17.0 g/dL   HCT 38.4 (L) 39.0 - 52.0 %   MCV 104.9 (H) 78.0 - 100.0 fL   MCH 34.7 (H) 26.0 - 34.0 pg   MCHC 33.1 30.0 - 36.0 g/dL   RDW 12.8 11.5 - 15.5 %   Platelets 317 150 - 400 K/uL  Differential  Result Value Ref Range   Neutrophils Relative % 70 %   Neutro Abs 6.3 1.7 - 7.7 K/uL   Lymphocytes Relative 17 %   Lymphs Abs 1.5 0.7 - 4.0 K/uL   Monocytes Relative 9 %   Monocytes Absolute 0.8 0.1 - 1.0 K/uL   Eosinophils Relative 2 %   Eosinophils Absolute 0.1 0.0 - 0.7 K/uL   Basophils Relative 1 %   Basophils Absolute 0.1 0.0 - 0.1 K/uL   Immature Granulocytes 1 %   Abs Immature Granulocytes 0.0 0.0 - 0.1 K/uL  Comprehensive metabolic panel  Result Value Ref Range   Sodium 135 135 - 145 mmol/L   Potassium 4.2 3.5 - 5.1 mmol/L   Chloride 103 98 - 111 mmol/L   CO2 22 22 - 32 mmol/L   Glucose, Bld 161 (H) 70 - 99 mg/dL   BUN 19 8 - 23 mg/dL   Creatinine, Ser 1.51 (H) 0.61 - 1.24 mg/dL   Calcium 9.4 8.9 - 10.3 mg/dL   Total Protein 6.6 6.5 - 8.1 g/dL   Albumin 3.7 3.5 - 5.0 g/dL   AST 25 15 - 41 U/L   ALT 19 0 - 44 U/L   Alkaline Phosphatase 57 38 -  126 U/L   Total Bilirubin 0.9 0.3 - 1.2 mg/dL   GFR calc non Af Amer 38 (L) >60 mL/min   GFR calc Af Amer 44 (L) >60 mL/min   Anion gap 10 5 - 15  I-stat troponin, ED  Result Value Ref Range   Troponin i, poc 0.01 0.00 - 0.08 ng/mL   Comment 3          CBG monitoring, ED  Result Value Ref Range   Glucose-Capillary 173 (H) 70 - 99 mg/dL  I-Stat Chem 8, ED  Result Value Ref Range   Sodium 137 135 - 145 mmol/L   Potassium 4.2 3.5 - 5.1 mmol/L   Chloride 104 98 - 111 mmol/L   BUN 20 8 - 23 mg/dL   Creatinine, Ser 1.50 (H) 0.61 - 1.24 mg/dL   Glucose, Bld 162 (H) 70 - 99 mg/dL   Calcium, Ion 1.18 1.15 - 1.40 mmol/L   TCO2 23 22 - 32 mmol/L   Hemoglobin 12.2 (L) 13.0 - 17.0 g/dL   HCT 36.0 (L) 39.0 - 52.0 %   Ct Angio Head W Or Wo Contrast  Result Date: 04/01/2018 CLINICAL DATA:  Stroke EXAM: CT ANGIOGRAPHY HEAD AND NECK TECHNIQUE: Multidetector CT imaging of the head and neck was performed using the standard protocol during bolus administration of intravenous contrast. Multiplanar CT image reconstructions and MIPs were obtained to evaluate the vascular anatomy. Carotid stenosis measurements (when applicable) are obtained utilizing NASCET criteria, using the distal internal carotid diameter as the denominator. CONTRAST:  60m ISOVUE-370 IOPAMIDOL (ISOVUE-370) INJECTION 76% COMPARISON:  CT head 04/01/2018 FINDINGS: CTA NECK FINDINGS Aortic arch: Atherosclerotic calcification aortic arch and proximal great vessels. Proximal great vessels widely patent. Right carotid system: Atherosclerotic calcification right carotid bulb without significant stenosis. Left carotid system: Atherosclerotic calcification left carotid bulb. 25% diameter stenosis internal carotid  artery on the left. Remainder of the left internal carotid artery widely patent. Vertebral arteries: Right vertebral artery dominant. Moderate stenosis origin of right vertebral artery. Remainder of the right vertebral artery is widely patent  to the basilar with mild atherosclerotic disease distally. Moderate stenosis at the origin of the left vertebral artery. Left vertebral artery diffusely diseased and occludes at the C1 level and does not continue to the basilar. Left PICA not visualized. Skeleton: Cervical spondylosis without acute skeletal abnormality. Other neck: Negative Upper chest: Mild bronchiectasis and scarring in the right upper lobe. 5 mm right upper lobe nodule axial image 158 Review of the MIP images confirms the above findings CTA HEAD FINDINGS Anterior circulation: Atherosclerotic calcification in the cavernous carotid bilaterally causing mild stenosis bilaterally. Anterior and middle cerebral arteries patent bilaterally without stenosis or large vessel occlusion Posterior circulation: Right vertebral artery patent to the basilar with mild calcific stenosis V4 segment. Right PICA is widely patent. Occlusion of left vertebral artery at the C1 level without reconstitution. Left PICA not visualized. Basilar widely patent. Mild atherosclerotic disease in the basilar artery. Superior cerebellar and posterior cerebral arteries patent bilaterally without stenosis. Venous sinuses: Patent Anatomic variants: None Delayed phase: Not performed Review of the MIP images confirms the above findings IMPRESSION: Mild atherosclerotic disease in the carotid bifurcation bilaterally. Atherosclerotic calcification and mild stenosis in the cavernous carotid bilaterally. Moderate stenosis at the origin of the vertebral artery bilaterally. Occlusion distal left vertebral artery at the C1 level. Left PICA occluded. This could be acute or chronic. Correlate with neurologic examination and MRI. These results were called by telephone at the time of interpretation on 04/01/2018 at 3:18 pm to Dr. Rosalin Hawking , who verbally acknowledged these results. Electronically Signed   By: Franchot Gallo M.D.   On: 04/01/2018 15:18   Ct Angio Neck W And/or Wo Contrast  Result  Date: 04/01/2018 CLINICAL DATA:  Stroke EXAM: CT ANGIOGRAPHY HEAD AND NECK TECHNIQUE: Multidetector CT imaging of the head and neck was performed using the standard protocol during bolus administration of intravenous contrast. Multiplanar CT image reconstructions and MIPs were obtained to evaluate the vascular anatomy. Carotid stenosis measurements (when applicable) are obtained utilizing NASCET criteria, using the distal internal carotid diameter as the denominator. CONTRAST:  59m ISOVUE-370 IOPAMIDOL (ISOVUE-370) INJECTION 76% COMPARISON:  CT head 04/01/2018 FINDINGS: CTA NECK FINDINGS Aortic arch: Atherosclerotic calcification aortic arch and proximal great vessels. Proximal great vessels widely patent. Right carotid system: Atherosclerotic calcification right carotid bulb without significant stenosis. Left carotid system: Atherosclerotic calcification left carotid bulb. 25% diameter stenosis internal carotid artery on the left. Remainder of the left internal carotid artery widely patent. Vertebral arteries: Right vertebral artery dominant. Moderate stenosis origin of right vertebral artery. Remainder of the right vertebral artery is widely patent to the basilar with mild atherosclerotic disease distally. Moderate stenosis at the origin of the left vertebral artery. Left vertebral artery diffusely diseased and occludes at the C1 level and does not continue to the basilar. Left PICA not visualized. Skeleton: Cervical spondylosis without acute skeletal abnormality. Other neck: Negative Upper chest: Mild bronchiectasis and scarring in the right upper lobe. 5 mm right upper lobe nodule axial image 158 Review of the MIP images confirms the above findings CTA HEAD FINDINGS Anterior circulation: Atherosclerotic calcification in the cavernous carotid bilaterally causing mild stenosis bilaterally. Anterior and middle cerebral arteries patent bilaterally without stenosis or large vessel occlusion Posterior circulation:  Right vertebral artery patent to the basilar with mild calcific  stenosis V4 segment. Right PICA is widely patent. Occlusion of left vertebral artery at the C1 level without reconstitution. Left PICA not visualized. Basilar widely patent. Mild atherosclerotic disease in the basilar artery. Superior cerebellar and posterior cerebral arteries patent bilaterally without stenosis. Venous sinuses: Patent Anatomic variants: None Delayed phase: Not performed Review of the MIP images confirms the above findings IMPRESSION: Mild atherosclerotic disease in the carotid bifurcation bilaterally. Atherosclerotic calcification and mild stenosis in the cavernous carotid bilaterally. Moderate stenosis at the origin of the vertebral artery bilaterally. Occlusion distal left vertebral artery at the C1 level. Left PICA occluded. This could be acute or chronic. Correlate with neurologic examination and MRI. These results were called by telephone at the time of interpretation on 04/01/2018 at 3:18 pm to Dr. Rosalin Hawking , who verbally acknowledged these results. Electronically Signed   By: Franchot Gallo M.D.   On: 04/01/2018 15:18   Ct Head Code Stroke Wo Contrast  Result Date: 04/01/2018 CLINICAL DATA:  Code stroke. Code stroke. Difficulty swallowing. Left-sided weakness. Last seen normal 1 hour ago. EXAM: CT HEAD WITHOUT CONTRAST TECHNIQUE: Contiguous axial images were obtained from the base of the skull through the vertex without intravenous contrast. COMPARISON:  CT head without contrast 10/04/2017 FINDINGS: Brain: Atrophy and white matter changes are within normal limits for age. Basal ganglia are intact. No acute infarct, hemorrhage, or mass lesion is present. Insular cortex is within normal limits. No acute or focal cortical abnormality is present ventricles are proportionate to the degree of atrophy. No significant extra-axial fluid collection is present. The brainstem and cerebellum are normal. Vascular: Atherosclerotic  calcifications are present the cavernous internal carotid arteries bilaterally and at the dural margin of both vertebral arteries. There is no hyperdense vessel. Skull: Calvarium is intact. No focal lytic or blastic lesions are present. Sinuses/Orbits: Chronic left maxillary sinus disease present. The paranasal sinuses and mastoid air cells are otherwise clear. Globes and orbits are within normal limits bilaterally. ASPECTS [facility] Stroke Program Early CT Score) - Ganglionic level infarction (caudate, lentiform nuclei, internal capsule, insula, M1-M3 cortex): 7/7 - Supraganglionic infarction (M4-M6 cortex): 3/3 Total score (0-10 with 10 being normal): 10/10 IMPRESSION: 1. Stable atrophy and white matter disease, likely within normal limits for age. 2. No acute intracranial abnormality. 3. Atherosclerosis. 4. ASPECTS is 10/10 The above was relayed via text pager to Dr. Erlinda Hong on 04/01/2018 at 14:49 . Electronically Signed   By: San Morelle M.D.   On: 04/01/2018 14:51    EKG None  Radiology Ct Angio Head W Or Wo Contrast  Result Date: 04/01/2018 CLINICAL DATA:  Stroke EXAM: CT ANGIOGRAPHY HEAD AND NECK TECHNIQUE: Multidetector CT imaging of the head and neck was performed using the standard protocol during bolus administration of intravenous contrast. Multiplanar CT image reconstructions and MIPs were obtained to evaluate the vascular anatomy. Carotid stenosis measurements (when applicable) are obtained utilizing NASCET criteria, using the distal internal carotid diameter as the denominator. CONTRAST:  16m ISOVUE-370 IOPAMIDOL (ISOVUE-370) INJECTION 76% COMPARISON:  CT head 04/01/2018 FINDINGS: CTA NECK FINDINGS Aortic arch: Atherosclerotic calcification aortic arch and proximal great vessels. Proximal great vessels widely patent. Right carotid system: Atherosclerotic calcification right carotid bulb without significant stenosis. Left carotid system: Atherosclerotic calcification left carotid bulb. 25%  diameter stenosis internal carotid artery on the left. Remainder of the left internal carotid artery widely patent. Vertebral arteries: Right vertebral artery dominant. Moderate stenosis origin of right vertebral artery. Remainder of the right vertebral artery is widely patent to  the basilar with mild atherosclerotic disease distally. Moderate stenosis at the origin of the left vertebral artery. Left vertebral artery diffusely diseased and occludes at the C1 level and does not continue to the basilar. Left PICA not visualized. Skeleton: Cervical spondylosis without acute skeletal abnormality. Other neck: Negative Upper chest: Mild bronchiectasis and scarring in the right upper lobe. 5 mm right upper lobe nodule axial image 158 Review of the MIP images confirms the above findings CTA HEAD FINDINGS Anterior circulation: Atherosclerotic calcification in the cavernous carotid bilaterally causing mild stenosis bilaterally. Anterior and middle cerebral arteries patent bilaterally without stenosis or large vessel occlusion Posterior circulation: Right vertebral artery patent to the basilar with mild calcific stenosis V4 segment. Right PICA is widely patent. Occlusion of left vertebral artery at the C1 level without reconstitution. Left PICA not visualized. Basilar widely patent. Mild atherosclerotic disease in the basilar artery. Superior cerebellar and posterior cerebral arteries patent bilaterally without stenosis. Venous sinuses: Patent Anatomic variants: None Delayed phase: Not performed Review of the MIP images confirms the above findings IMPRESSION: Mild atherosclerotic disease in the carotid bifurcation bilaterally. Atherosclerotic calcification and mild stenosis in the cavernous carotid bilaterally. Moderate stenosis at the origin of the vertebral artery bilaterally. Occlusion distal left vertebral artery at the C1 level. Left PICA occluded. This could be acute or chronic. Correlate with neurologic examination and  MRI. These results were called by telephone at the time of interpretation on 04/01/2018 at 3:18 pm to Dr. Rosalin Hawking , who verbally acknowledged these results. Electronically Signed   By: Franchot Gallo M.D.   On: 04/01/2018 15:18   Ct Angio Neck W And/or Wo Contrast  Result Date: 04/01/2018 CLINICAL DATA:  Stroke EXAM: CT ANGIOGRAPHY HEAD AND NECK TECHNIQUE: Multidetector CT imaging of the head and neck was performed using the standard protocol during bolus administration of intravenous contrast. Multiplanar CT image reconstructions and MIPs were obtained to evaluate the vascular anatomy. Carotid stenosis measurements (when applicable) are obtained utilizing NASCET criteria, using the distal internal carotid diameter as the denominator. CONTRAST:  48m ISOVUE-370 IOPAMIDOL (ISOVUE-370) INJECTION 76% COMPARISON:  CT head 04/01/2018 FINDINGS: CTA NECK FINDINGS Aortic arch: Atherosclerotic calcification aortic arch and proximal great vessels. Proximal great vessels widely patent. Right carotid system: Atherosclerotic calcification right carotid bulb without significant stenosis. Left carotid system: Atherosclerotic calcification left carotid bulb. 25% diameter stenosis internal carotid artery on the left. Remainder of the left internal carotid artery widely patent. Vertebral arteries: Right vertebral artery dominant. Moderate stenosis origin of right vertebral artery. Remainder of the right vertebral artery is widely patent to the basilar with mild atherosclerotic disease distally. Moderate stenosis at the origin of the left vertebral artery. Left vertebral artery diffusely diseased and occludes at the C1 level and does not continue to the basilar. Left PICA not visualized. Skeleton: Cervical spondylosis without acute skeletal abnormality. Other neck: Negative Upper chest: Mild bronchiectasis and scarring in the right upper lobe. 5 mm right upper lobe nodule axial image 158 Review of the MIP images confirms the  above findings CTA HEAD FINDINGS Anterior circulation: Atherosclerotic calcification in the cavernous carotid bilaterally causing mild stenosis bilaterally. Anterior and middle cerebral arteries patent bilaterally without stenosis or large vessel occlusion Posterior circulation: Right vertebral artery patent to the basilar with mild calcific stenosis V4 segment. Right PICA is widely patent. Occlusion of left vertebral artery at the C1 level without reconstitution. Left PICA not visualized. Basilar widely patent. Mild atherosclerotic disease in the basilar artery. Superior cerebellar and posterior  cerebral arteries patent bilaterally without stenosis. Venous sinuses: Patent Anatomic variants: None Delayed phase: Not performed Review of the MIP images confirms the above findings IMPRESSION: Mild atherosclerotic disease in the carotid bifurcation bilaterally. Atherosclerotic calcification and mild stenosis in the cavernous carotid bilaterally. Moderate stenosis at the origin of the vertebral artery bilaterally. Occlusion distal left vertebral artery at the C1 level. Left PICA occluded. This could be acute or chronic. Correlate with neurologic examination and MRI. These results were called by telephone at the time of interpretation on 04/01/2018 at 3:18 pm to Dr. Rosalin Hawking , who verbally acknowledged these results. Electronically Signed   By: Franchot Gallo M.D.   On: 04/01/2018 15:18   Ct Head Code Stroke Wo Contrast  Result Date: 04/01/2018 CLINICAL DATA:  Code stroke. Code stroke. Difficulty swallowing. Left-sided weakness. Last seen normal 1 hour ago. EXAM: CT HEAD WITHOUT CONTRAST TECHNIQUE: Contiguous axial images were obtained from the base of the skull through the vertex without intravenous contrast. COMPARISON:  CT head without contrast 10/04/2017 FINDINGS: Brain: Atrophy and white matter changes are within normal limits for age. Basal ganglia are intact. No acute infarct, hemorrhage, or mass lesion is  present. Insular cortex is within normal limits. No acute or focal cortical abnormality is present ventricles are proportionate to the degree of atrophy. No significant extra-axial fluid collection is present. The brainstem and cerebellum are normal. Vascular: Atherosclerotic calcifications are present the cavernous internal carotid arteries bilaterally and at the dural margin of both vertebral arteries. There is no hyperdense vessel. Skull: Calvarium is intact. No focal lytic or blastic lesions are present. Sinuses/Orbits: Chronic left maxillary sinus disease present. The paranasal sinuses and mastoid air cells are otherwise clear. Globes and orbits are within normal limits bilaterally. ASPECTS [facility] Stroke Program Early CT Score) - Ganglionic level infarction (caudate, lentiform nuclei, internal capsule, insula, M1-M3 cortex): 7/7 - Supraganglionic infarction (M4-M6 cortex): 3/3 Total score (0-10 with 10 being normal): 10/10 IMPRESSION: 1. Stable atrophy and white matter disease, likely within normal limits for age. 2. No acute intracranial abnormality. 3. Atherosclerosis. 4. ASPECTS is 10/10 The above was relayed via text pager to Dr. Erlinda Hong on 04/01/2018 at 14:49 . Electronically Signed   By: San Morelle M.D.   On: 04/01/2018 14:51    Procedures Procedures (including critical care time)  Medications Ordered in ED Medications  alteplase (ACTIVASE) 1 mg/mL infusion 60.5 mg (has no administration in time range)    Followed by  0.9 %  sodium chloride infusion (has no administration in time range)   stroke: mapping our early stages of recovery book (has no administration in time range)  0.9 %  sodium chloride infusion (has no administration in time range)  acetaminophen (TYLENOL) tablet 650 mg (has no administration in time range)    Or  acetaminophen (TYLENOL) solution 650 mg (has no administration in time range)    Or  acetaminophen (TYLENOL) suppository 650 mg (has no administration in time  range)  famotidine (PEPCID) IVPB 20 mg premix (has no administration in time range)  iopamidol (ISOVUE-370) 76 % injection 100 mL (50 mLs Intravenous Contrast Given 04/01/18 1501)     Initial Impression / Assessment and Plan / ED Course  I have reviewed the triage vital signs and the nursing notes.  Pertinent labs & imaging results that were available during my care of the patient were reviewed by me and considered in my medical decision making (see chart for details).  Iv ns. Stat labs. Stat ct.  Code stroke activated.   Reviewed nursing notes and prior charts for additional history.   Neurology/stroke team evaluated in ED.   Neurologist initiated tpa therapy.  Recheck neuro exam, unchanged from prior.   Ct head reviewed - no hem.   cta reviewed - ?pica occlusion.  Labs reviewed - lytes normal.   Serial neuro checks - unchanged from initial.  CRITICAL CARE Performed by: Mirna Mires Total critical care time: 40 minutes Critical care time was exclusive of separately billable procedures and treating other patients. Critical care was necessary to treat or prevent imminent or life-threatening deterioration. Critical care was time spent personally by me on the following activities: development of treatment plan with patient and/or surrogate as well as nursing, discussions with consultants, evaluation of patient's response to treatment, examination of patient, obtaining history from patient or surrogate, ordering and performing treatments and interventions, ordering and review of laboratory studies, ordering and review of radiographic studies, pulse oximetry and re-evaluation of patient's condition.  Admitted to neuro icu. Mri pending.     Final Clinical Impressions(s) / ED Diagnoses   Final diagnoses:  None    ED Discharge Orders    None       Lajean Saver, MD 04/01/18 1640

## 2018-04-01 NOTE — Progress Notes (Signed)
At the end of TPA infusion, pt was found to have subcutaneous small hematoma at left arm below the peripheral IV site, consistent with superficial venous bleeding. Put on compression bandage. So far stable no more bleeding.   On arrival to ICU, pt was found to have left upper lip swallow and tip of tongue swelling and redness. Concerning for mild angioedema from tPA. Given solumedrol 125mg  and benadryl 25mg  and Pepcid 20mg  IV as per protocol. Only the benadryl used 25mg  instead of 50mg  given advanced age and to avoid any behavioral/cognitive changes.   Will need to continue close monitoring.   Rosalin Hawking, MD PhD Stroke Neurology 04/01/2018 4:22 PM

## 2018-04-01 NOTE — Code Documentation (Addendum)
84 yo male coming from independent living with complaints of sudden onset of left sided weakness and slurred speech at 1345. EMS called and activated Code Stroke. Stroke Team met patient upon arrival. Initial NIHSS 5 due to right facial droop, left arm ataxia, bilateral leg weakness, and dysarthria. CT completed and showed no signs of hemorrhage. tPA started at 1450. 54.4 mg/hr started with 6.1 mg bolus given over 1 minute. Pt complaining of being unable to swallow and unable to control secretions. Suctioning being used. Alert and oriented x4. tPA was dripping around line. Due to malfunction of equipment and loss of volume, tPA stopped at 40 mg to be administered and tPA bottle switched. VTBI verified by Michael, RPH and new bottle of 40 mg of tPA hung to be infused. Placed on the monitor. Patient returned to room. Family at the bedside. Pt to be admitted. Handoff given to Greg, RN.  

## 2018-04-01 NOTE — H&P (Addendum)
Stroke Neurology admission Note  The history was obtained from the pt and EMS.  During history and examination, all items were able to obtain unless otherwise noted.  History of Present Illness:  Nathan Cook is a 84 y.o. Caucasian male with PMH of HTN, HLD, DM, PVD presented for code stroke.  Patient was in kitchen preparing for sandwich, all of a sudden, he started to have difficulty speech, soft voice, difficulty swallowing, with copious secretions spitting out to the ground.  He was also diaphoretic with left-sided weakness.  EMS called.  On arrival patient showing difficulty swallowing and dysarthria, no significant weakness however leaning towards on the left with mild left ataxia.  CBG 145, blood pressure 144/66 and oxygen 97%.  Patient was stroke alerted to [facility]: Lambert Rhonda W.  Last seen normal 1:30 PM  LSN: 1:30 PM 04/01/2018 tPA Given: Yes      Past Medical History:  Diagnosis Date  . BPH (benign prostatic hyperplasia) 10/14/2014  . Cervicalgia 01/04/2016  . Contusion of left shoulder 05/27/14  . DM type 2 (diabetes mellitus, type 2) ([place])   . High blood pressure   . History of basal cell cancer 11/21/2005   Right temple  . History of colonic polyps 10/14/2003  . Hyperlipidemia   . Left knee pain 05/27/14  . SAH (subarachnoid hemorrhage) ([place]) 2009   TBI  . TIA (transient ischemic attack) 11/06/2005   Left eye pain. Slurred speech. Diaphoresis. No residual effects.          Past Surgical History:  Procedure Laterality Date  . Achilles tendon repair Left 1998   ruptured tendon  . COLONOSCOPY  1992   Polyp removed  . COLONOSCOPY  1998   normal  . ORIF FEMUR FRACTURE Right 1929  . TONSILLECTOMY           Family History  Problem Relation Age of Onset  . Heart attack Mother   . Heart disease Mother   . Heart disease Father     Social History:  reports that he quit smoking about 46 years ago. He has never used smokeless tobacco. He reports that he drinks  about 1.0 - 2.0 standard drinks of alcohol per week. He reports that he does not use drugs.  Allergies: No Known Allergies  No current facility-administered medications on file prior to encounter.          Current Outpatient Medications on File Prior to Encounter  Medication Sig Dispense Refill  . acetaminophen (TYLENOL 8 HOUR) 650 MG CR tablet Take 1 tablet (650 mg total) by mouth every 8 (eight) hours as needed for pain. 60 tablet 0  . aspirin 325 MG EC tablet Take 1 tablet (325 mg total) by mouth daily. 30 tablet 0  . Blood Glucose Monitoring Suppl (ONE TOUCH ULTRA SYSTEM KIT) w/Device KIT Use to test blood sugar twice daily. Dx E11.9 1 each 0  . calcium carbonate 1250 MG capsule Take 600 mg by mouth daily.    . Carboxymethylcellulose Sodium (EYE DROPS OP) Place 2 drops into both eyes daily as needed (dry eyes).     . Cholecalciferol 1000 UNITS tablet Take 1,000 Units by mouth daily.    Marland Kitchen glucose blood test strip One Touch Ultra Test Strip Sig:Use to test blood sugar twice daily. Dx E11.9 300 each 3  . Lancets (UNILET COMFORTOUCH LANCET) MISC Use to test blood sugar twice daily. Dx: E11.9 300 each 3  . losartan (COZAAR) 50 MG tablet Take 1 tablet (50 mg total)  by mouth daily. 90 tablet 3  . metFORMIN (GLUCOPHAGE) 1000 MG tablet TAKE ONE TABLET BY MOUTH EVERY MORNING TO CONTROL DIABETES 90 tablet 0  . simvastatin (ZOCOR) 10 MG tablet Take 1 tablet (10 mg total) by mouth at bedtime. 90 tablet 3    Review of Systems: A full ROS was attempted today and was able to be performed.  Systems assessed include - Constitutional, Eyes, HENT, Respiratory, Cardiovascular, Gastrointestinal, Genitourinary, Integument/breast, Hematologic/lymphatic, Musculoskeletal, Neurological, Behavioral/Psych, Endocrine, Allergic/Immunologic - with pertinent responses as per HPI.  Physical Examination: Weight:  [67.2 kg] 67.2 kg (10/07 1400)  General - well nourished, well developed, in mild distress.     Ophthalmologic - fundi not visualized due to noncooperation.    Cardiovascular - regular rate and rhythm  Mental Status -  Level of arousal and orientation to time, place, and person were intact. Language including expression, naming, repetition, comprehension was assessed and found intact.  However, dysarthria with hypophonia  Cranial Nerves II - XII - II - Vision intact OU. III, IV, VI - Extraocular movements intact. V - Facial sensation intact bilaterally. VII - mild right facial droop. VIII - Hearing & vestibular intact bilaterally. X - difficulty swallowing, and dysarthria with hypophonia. XI - Chin turning & shoulder shrug intact bilaterally. XII - Tongue protrusion intact.  Motor Strength - The patient's strength was normal in all extremities and pronator drift was absent.   Motor Tone & Bulk - Muscle tone was assessed at the neck and appendages and was normal.  Bulk was normal and fasciculations were absent.   Reflexes - The patient's reflexes were normal in all extremities and he had no pathological reflexes.  Sensory - Light touch, temperature/pinprick were assessed and were normal.    Coordination - ataxia on left finger-to-nose.  Tremor was absent.  Gait and Station - deferred  NIH Stroke Scale        Level Of Consciousness 0=Alert; keenly responsive 1=Not alert, but arousable by minor stimulation 2=Not alert, requires repeated stimulation 3=Responds only with reflex movements 0  LOC Questions to Month and Age 62=Answers both questions correctly 1=Answers one question correctly 2=Answers neither question correctly 0  LOC Commands      -Open/Close eyes     -Open/close grip 0=Performs both tasks correctly 1=Performs one task correctly 2=Performs neighter task correctly 0  Best Gaze 0=Normal 1=Partial gaze palsy 2=Forced deviation, or total gaze paresis 0  Visual 0=No visual loss 1=Partial hemianopia 2=Complete hemianopia 3=Bilateral  hemianopia (blind including cortical blindness) 0  Facial Palsy 0=Normal symmetrical movement 1=Minor paralysis (asymmetry) 2=Partial paralysis (lower face) 3=Complete paralysis (upper and lower face) 1  Motor  0=No drift, limb holds posture for full 10 seconds 1=Drift, limb holds posture, no drift to bed 2=Some antigravity effort, cannot maintain posture, drifts to bed 3=No effort against gravity, limb falls 4=No movement Right Arm 0     Leg 1    Left Arm 0     Leg 1  Limb Ataxia 0=Absent 1=Present in one limb 2=Present in two limbs 1  Sensory 0=Normal 1=Mild to moderate sensory loss 2=Severe to total sensory loss 0  Best Language 0=No aphasia, normal 1=Mild to moderate aphasia 2=Mute, global aphasia 3=Mute, global aphasia 0  Dysarthria 0=Normal 1=Mild to moderate 2=Severe, unintelligible or mute/anarthric 1  Extinction/Neglect 0=No abnormality 1=Extinction to bilateral simultaneous stimulation 2=Profound neglect 0  Total   5     Data Reviewed:  ImagingResults  Ct Head Code Stroke Wo Contrast  Result Date:  04/01/2018 CLINICAL DATA:  Code stroke. Code stroke. Difficulty swallowing. Left-sided weakness. Last seen normal 1 hour ago. EXAM: CT HEAD WITHOUT CONTRAST TECHNIQUE: Contiguous axial images were obtained from the base of the skull through the vertex without intravenous contrast. COMPARISON:  CT head without contrast 10/04/2017 FINDINGS: Brain: Atrophy and white matter changes are within normal limits for age. Basal ganglia are intact. No acute infarct, hemorrhage, or mass lesion is present. Insular cortex is within normal limits. No acute or focal cortical abnormality is present ventricles are proportionate to the degree of atrophy. No significant extra-axial fluid collection is present. The brainstem and cerebellum are normal. Vascular: Atherosclerotic calcifications are present the cavernous internal carotid arteries bilaterally and at the dural margin of both  vertebral arteries. There is no hyperdense vessel. Skull: Calvarium is intact. No focal lytic or blastic lesions are present. Sinuses/Orbits: Chronic left maxillary sinus disease present. The paranasal sinuses and mastoid air cells are otherwise clear. Globes and orbits are within normal limits bilaterally. ASPECTS [facility] Stroke Program Early CT Score) - Ganglionic level infarction (caudate, lentiform nuclei, internal capsule, insula, M1-M3 cortex): 7/7 - Supraganglionic infarction (M4-M6 cortex): 3/3 Total score (0-10 with 10 being normal): 10/10 IMPRESSION: 1. Stable atrophy and white matter disease, likely within normal limits for age. 2. No acute intracranial abnormality. 3. Atherosclerosis. 4. ASPECTS is 10/10 The above was relayed via text pager to Dr. Erlinda Hong on 04/01/2018 at 14:49 . Electronically Signed   By: San Morelle M.D.   On: 04/01/2018 14:51     Assessment: 84 y.o. male with PMH of HTN, HLD, DM, PVD presented with acute onset difficulty speech and swallowing, left-sided ataxia.  Last seen normal 1:30 PM. Baseline functional independently, LSN 1:30pm. CT no acute abnormality, TPA was given.  CT head and neck showed no LVO but possible nondominant left VA distal occlusion involving left PICA. Pt will admit to ICU with MRI pending tomorrow.  Stroke Risk Factors - diabetes mellitus, hyperlipidemia, hypertension and advance age  Plan: - admit to ICU for close monitoring - HgbA1c, fasting lipid panel - MRI of the brain without contrast at 24h of tPA - PT consult, OT consult, Speech consult - Echocardiogram - BP goal < 180/105 for at least 24h post tPA - Risk factor modification - Telemetry monitoring - Frequent neuro checks - if acute neuro change, will do CT to rule out ICH  Rosalin Hawking, MD PhD Stroke Neurology 04/01/2018 3:48 PM  This patient is critically ill due to code stroke s/p tPA and at significant risk of neurological worsening, death form bleeding, ICH, shock,  cerebral edema and uncal herniation. This patient's care requires constant monitoring of vital signs, hemodynamics, respiratory and cardiac monitoring, review of multiple databases, neurological assessment, discussion with family, other specialists and medical decision making of high complexity. I spent 50 minutes of neurocritical care time in the care of this patient.

## 2018-04-01 NOTE — Code Documentation (Signed)
Pt transferred upstairs to [place]. While giving report to Susie, RN, this RN noted swelling to the patient's left upper lip. Evaluated with RN and MD Erlinda Hong made aware immediately. 25 mg of Benadryl ordered and given. MD Erlinda Hong came to the bedside to assess the patient. Patient continues to have patent airway and is alert and oriented x4. Angioedema due to tPA orderset utilized for patient. IV Solumedrol 125 mg and Pepcid 20 mg given per MD order. Pt to be continually monitored by Daine Floras, RN.

## 2018-04-01 NOTE — Code Documentation (Signed)
Some slight red blood noted in sputum. Noted to be streaking and not frank blood. Pt has not been able to swallow sputum since he arrived to the ED. Pt has been actively coughing up sputum and suctioning with [place]. MD made aware. RN to monitor.

## 2018-04-01 NOTE — Code Documentation (Signed)
Hematoma noted to the left lower forearm. Not in conjunction with IV site. MD Erlinda Hong made aware. Not arterial, but venous bleed. Pressure dressing applied.

## 2018-04-01 NOTE — Progress Notes (Signed)
Pharmacist Code Stroke Response  Notified to mix tPA at 1446 by Dr. Erlinda Hong Delivered tPA to RN at 1450  tPA dose = 6.1mg  bolus over 1 minute followed by 54.4mg  for a total dose of 60.5mg  over 1 hour  Issues/delays encountered (if applicable): N/A  Hend Mccarrell, Rande Lawman 04/01/18 2:49 PM

## 2018-04-01 NOTE — ED Triage Notes (Signed)
Arrived via EMS [place] at home independent living [facility]. Patient developed sudden onset of left sided weakness, copious amount of clear secretions. Alert answering and following commands appropriate.

## 2018-04-01 NOTE — Consult Note (Signed)
Stroke Neurology Consultation Note  Consult Requested by: Dr. Ashok Cordia   Reason for Consult: code stroke  Consult Date: 04/01/18  The history was obtained from the pt and EMS.  During history and examination, all items were able to obtain unless otherwise noted.  History of Present Illness:  Nathan Cook is a 84 y.o. Caucasian male with PMH of HTN, HLD, DM, PVD presented for code stroke.  Patient was in kitchen preparing for sandwich, all of a sudden, he started to have difficulty speech, soft voice, difficulty swallowing, with copious secretions spitting out to the ground.  He was also diaphoretic with left-sided weakness.  EMS called.  On arrival patient showing difficulty swallowing and dysarthria, no significant weakness however leaning towards on the left with mild left ataxia.  CBG 145, blood pressure 144/66 and oxygen 97%.  Patient was stroke alerted to [facility].  Last seen normal 1:30 PM  LSN: 1:30 PM 04/01/2018 tPA Given: Yes  Past Medical History:  Diagnosis Date  . BPH (benign prostatic hyperplasia) 10/14/2014  . Cervicalgia 01/04/2016  . Contusion of left shoulder 05/27/14  . DM type 2 (diabetes mellitus, type 2) ([place])   . High blood pressure   . History of basal cell cancer 11/21/2005   Right temple  . History of colonic polyps 10/14/2003  . Hyperlipidemia   . Left knee pain 05/27/14  . SAH (subarachnoid hemorrhage) ([place]) 2009   TBI  . TIA (transient ischemic attack) 11/06/2005   Left eye pain. Slurred speech. Diaphoresis. No residual effects.     Past Surgical History:  Procedure Laterality Date  . Achilles tendon repair Left 1998   ruptured tendon  . COLONOSCOPY  1992   Polyp removed  . COLONOSCOPY  1998   normal  . ORIF FEMUR FRACTURE Right 1929  . TONSILLECTOMY      Family History  Problem Relation Age of Onset  . Heart attack Mother   . Heart disease Mother   . Heart disease Father     Social History:  reports that he quit smoking about 46 years ago. He  has never used smokeless tobacco. He reports that he drinks about 1.0 - 2.0 standard drinks of alcohol per week. He reports that he does not use drugs.  Allergies: No Known Allergies  No current facility-administered medications on file prior to encounter.    Current Outpatient Medications on File Prior to Encounter  Medication Sig Dispense Refill  . acetaminophen (TYLENOL 8 HOUR) 650 MG CR tablet Take 1 tablet (650 mg total) by mouth every 8 (eight) hours as needed for pain. 60 tablet 0  . aspirin 325 MG EC tablet Take 1 tablet (325 mg total) by mouth daily. 30 tablet 0  . Blood Glucose Monitoring Suppl (ONE TOUCH ULTRA SYSTEM KIT) w/Device KIT Use to test blood sugar twice daily. Dx E11.9 1 each 0  . calcium carbonate 1250 MG capsule Take 600 mg by mouth daily.    . Carboxymethylcellulose Sodium (EYE DROPS OP) Place 2 drops into both eyes daily as needed (dry eyes).     . Cholecalciferol 1000 UNITS tablet Take 1,000 Units by mouth daily.    Marland Kitchen glucose blood test strip One Touch Ultra Test Strip Sig:Use to test blood sugar twice daily. Dx E11.9 300 each 3  . Lancets (UNILET COMFORTOUCH LANCET) MISC Use to test blood sugar twice daily. Dx: E11.9 300 each 3  . losartan (COZAAR) 50 MG tablet Take 1 tablet (50 mg total) by mouth daily.  90 tablet 3  . metFORMIN (GLUCOPHAGE) 1000 MG tablet TAKE ONE TABLET BY MOUTH EVERY MORNING TO CONTROL DIABETES 90 tablet 0  . simvastatin (ZOCOR) 10 MG tablet Take 1 tablet (10 mg total) by mouth at bedtime. 90 tablet 3    Review of Systems: A full ROS was attempted today and was able to be performed.  Systems assessed include - Constitutional, Eyes, HENT, Respiratory, Cardiovascular, Gastrointestinal, Genitourinary, Integument/breast, Hematologic/lymphatic, Musculoskeletal, Neurological, Behavioral/Psych, Endocrine, Allergic/Immunologic - with pertinent responses as per HPI.  Physical Examination: Weight:  [67.2 kg] 67.2 kg (10/07 1400)  General - well  nourished, well developed, in mild distress.    Ophthalmologic - fundi not visualized due to noncooperation.    Cardiovascular - regular rate and rhythm  Mental Status -  Level of arousal and orientation to time, place, and person were intact. Language including expression, naming, repetition, comprehension was assessed and found intact.  However, dysarthria with hypophonia  Cranial Nerves II - XII - II - Vision intact OU. III, IV, VI - Extraocular movements intact. V - Facial sensation intact bilaterally. VII - mild right facial droop. VIII - Hearing & vestibular intact bilaterally. X - difficulty swallowing, and dysarthria with hypophonia. XI - Chin turning & shoulder shrug intact bilaterally. XII - Tongue protrusion intact.  Motor Strength - The patient's strength was normal in all extremities and pronator drift was absent.   Motor Tone & Bulk - Muscle tone was assessed at the neck and appendages and was normal.  Bulk was normal and fasciculations were absent.   Reflexes - The patient's reflexes were normal in all extremities and he had no pathological reflexes.  Sensory - Light touch, temperature/pinprick were assessed and were normal.    Coordination - ataxia on left finger-to-nose.  Tremor was absent.  Gait and Station - deferred  NIH Stroke Scale  Level Of Consciousness 0=Alert; keenly responsive 1=Not alert, but arousable by minor stimulation 2=Not alert, requires repeated stimulation 3=Responds only with reflex movements 0  LOC Questions to Month and Age 102=Answers both questions correctly 1=Answers one question correctly 2=Answers neither question correctly 0  LOC Commands      -Open/Close eyes     -Open/close grip 0=Performs both tasks correctly 1=Performs one task correctly 2=Performs neighter task correctly 0  Best Gaze 0=Normal 1=Partial gaze palsy 2=Forced deviation, or total gaze paresis 0  Visual 0=No visual loss 1=Partial hemianopia 2=Complete  hemianopia 3=Bilateral hemianopia (blind including cortical blindness) 0  Facial Palsy 0=Normal symmetrical movement 1=Minor paralysis (asymmetry) 2=Partial paralysis (lower face) 3=Complete paralysis (upper and lower face) 1  Motor  0=No drift, limb holds posture for full 10 seconds 1=Drift, limb holds posture, no drift to bed 2=Some antigravity effort, cannot maintain posture, drifts to bed 3=No effort against gravity, limb falls 4=No movement Right Arm 0     Leg 1    Left Arm 0     Leg 1  Limb Ataxia 0=Absent 1=Present in one limb 2=Present in two limbs 1  Sensory 0=Normal 1=Mild to moderate sensory loss 2=Severe to total sensory loss 0  Best Language 0=No aphasia, normal 1=Mild to moderate aphasia 2=Mute, global aphasia 3=Mute, global aphasia 0  Dysarthria 0=Normal 1=Mild to moderate 2=Severe, unintelligible or mute/anarthric 1  Extinction/Neglect 0=No abnormality 1=Extinction to bilateral simultaneous stimulation 2=Profound neglect 0  Total   5     Data Reviewed: Ct Head Code Stroke Wo Contrast  Result Date: 04/01/2018 CLINICAL DATA:  Code stroke. Code stroke. Difficulty swallowing. Left-sided weakness.  Last seen normal 1 hour ago. EXAM: CT HEAD WITHOUT CONTRAST TECHNIQUE: Contiguous axial images were obtained from the base of the skull through the vertex without intravenous contrast. COMPARISON:  CT head without contrast 10/04/2017 FINDINGS: Brain: Atrophy and white matter changes are within normal limits for age. Basal ganglia are intact. No acute infarct, hemorrhage, or mass lesion is present. Insular cortex is within normal limits. No acute or focal cortical abnormality is present ventricles are proportionate to the degree of atrophy. No significant extra-axial fluid collection is present. The brainstem and cerebellum are normal. Vascular: Atherosclerotic calcifications are present the cavernous internal carotid arteries bilaterally and at the dural margin of both  vertebral arteries. There is no hyperdense vessel. Skull: Calvarium is intact. No focal lytic or blastic lesions are present. Sinuses/Orbits: Chronic left maxillary sinus disease present. The paranasal sinuses and mastoid air cells are otherwise clear. Globes and orbits are within normal limits bilaterally. ASPECTS [facility] Stroke Program Early CT Score) - Ganglionic level infarction (caudate, lentiform nuclei, internal capsule, insula, M1-M3 cortex): 7/7 - Supraganglionic infarction (M4-M6 cortex): 3/3 Total score (0-10 with 10 being normal): 10/10 IMPRESSION: 1. Stable atrophy and white matter disease, likely within normal limits for age. 2. No acute intracranial abnormality. 3. Atherosclerosis. 4. ASPECTS is 10/10 The above was relayed via text pager to Dr. Erlinda Hong on 04/01/2018 at 14:49 . Electronically Signed   By: San Morelle M.D.   On: 04/01/2018 14:51    Assessment: 84 y.o. male with PMH of HTN, HLD, DM, PVD presented with acute onset difficulty speech and swallowing, left-sided ataxia.  Last seen normal 1:30 PM. Baseline functional independently, LSN 1:30pm. CT no acute abnormality, TPA was given.  CT head and neck showed no LVO but possible nondominant left VA distal occlusion involving left PICA. Pt will admit to ICU with MRI pending tomorrow.  Stroke Risk Factors - diabetes mellitus, hyperlipidemia, hypertension and advance age  Plan: - admit to ICU for close monitoring - HgbA1c, fasting lipid panel - MRI of the brain without contrast at 24h of tPA - PT consult, OT consult, Speech consult - Echocardiogram - BP goal < 180/105 for at least 24h post tPA - Risk factor modification - Telemetry monitoring - Frequent neuro checks - if acute neuro change, will do CT to rule out ICH  Thank you for this consultation and allowing [place] to participate in the care of this patient.  Rosalin Hawking, MD PhD Stroke Neurology 04/01/2018 3:48 PM  This patient is critically ill due to code stroke s/p  tPA and at significant risk of neurological worsening, death form bleeding, ICH, shock, cerebral edema and uncal herniation. This patient's care requires constant monitoring of vital signs, hemodynamics, respiratory and cardiac monitoring, review of multiple databases, neurological assessment, discussion with family, other specialists and medical decision making of high complexity. I spent 50 minutes of neurocritical care time in the care of this patient.

## 2018-04-01 NOTE — ED Notes (Signed)
EMS reported suctioned clear sputum 23ml-75ml prior to arrival.

## 2018-04-02 ENCOUNTER — Inpatient Hospital Stay (HOSPITAL_COMMUNITY): Payer: Medicare Other

## 2018-04-02 DIAGNOSIS — M25511 Pain in right shoulder: Secondary | ICD-10-CM

## 2018-04-02 DIAGNOSIS — I351 Nonrheumatic aortic (valve) insufficiency: Secondary | ICD-10-CM

## 2018-04-02 DIAGNOSIS — R739 Hyperglycemia, unspecified: Secondary | ICD-10-CM

## 2018-04-02 DIAGNOSIS — I639 Cerebral infarction, unspecified: Principal | ICD-10-CM

## 2018-04-02 LAB — GLUCOSE, CAPILLARY
GLUCOSE-CAPILLARY: 209 mg/dL — AB (ref 70–99)
GLUCOSE-CAPILLARY: 197 mg/dL — AB (ref 70–99)
GLUCOSE-CAPILLARY: 163 mg/dL — AB (ref 70–99)
Glucose-Capillary: 175 mg/dL — ABNORMAL HIGH (ref 70–99)
Glucose-Capillary: 167 mg/dL — ABNORMAL HIGH (ref 70–99)

## 2018-04-02 LAB — LIPID PANEL
Cholesterol: 143 mg/dL (ref 0–200)
HDL: 57 mg/dL (ref >40)
LDL CALC: 80 mg/dL (ref 0–99)
Total CHOL/HDL Ratio: 2.5 RATIO
Triglycerides: 29 mg/dL (ref <150)
VLDL: 6 mg/dL (ref 0–40)

## 2018-04-02 LAB — CBC
HEMATOCRIT: 35.3 % — AB (ref 39.0–52.0)
HEMOGLOBIN: 11.8 g/dL — AB (ref 13.0–17.0)
MCH: 33.8 pg (ref 26.0–34.0)
MCHC: 33.4 g/dL (ref 30.0–36.0)
MCV: 101.1 fL — AB (ref 80.0–100.0)
PLATELETS: 287 10*3/uL (ref 150–400)
RBC: 3.49 MIL/uL — AB (ref 4.22–5.81)
RDW: 12.8 % (ref 11.5–15.5)
WBC: 8.2 10*3/uL (ref 4.0–10.5)

## 2018-04-02 LAB — BASIC METABOLIC PANEL
ANION GAP: 10 (ref 5–15)
BUN: 24 mg/dL — ABNORMAL HIGH (ref 8–23)
CHLORIDE: 103 mmol/L (ref 98–111)
CO2: 22 mmol/L (ref 22–32)
Calcium: 8.8 mg/dL — ABNORMAL LOW (ref 8.9–10.3)
Creatinine, Ser: 1.34 mg/dL — ABNORMAL HIGH (ref 0.61–1.24)
GFR calc Af Amer: 51 mL/min — ABNORMAL LOW (ref >60)
GFR, EST NON AFRICAN AMERICAN: 44 mL/min — AB (ref >60)
Glucose, Bld: 205 mg/dL — ABNORMAL HIGH (ref 70–99)
POTASSIUM: 4 mmol/L (ref 3.5–5.1)
Sodium: 135 mmol/L (ref 135–145)

## 2018-04-02 LAB — ECHOCARDIOGRAM COMPLETE
Height: 69 in
Weight: 2310.42 oz

## 2018-04-02 LAB — HEMOGLOBIN A1C
Hgb A1c MFr Bld: 7.6 % — ABNORMAL HIGH (ref 4.8–5.6)
Mean Plasma Glucose: 171.42 mg/dL

## 2018-04-02 NOTE — Progress Notes (Signed)
Modified Barium Swallow Progress Note  Patient Details  Name: Nathan Cook MRN: [id number] Date of Birth: Feb 16, 1922  Today's Date: 04/02/2018  Modified Barium Swallow completed.  Full report located under Chart Review in the Imaging Section.  Brief recommendations include the following:  Clinical Impression  Pt presents with a severe pharyngeal dysphagia marked by impaired mobility of the larynx and pharynx, leading to incomplete laryngeal vestibular closure (LVC) and poor pharyngeal squeeze.  There is sensed aspiration of liquids before and during the swallow due to incomplete LVC and after the swallow due to spillage of residue from the pyriforms. There is severe vallecular and pyriform residue that pt is unable to clear despite postural adjustments.  UES only partially opens due to compromised elevation of the larynx - this contributes to the degree of pyriform residue.  Pt self-suctioned and worked to expectorate residue after the study.  For now, recommend continuing NPO; consider temporary enteral feeding via cortrak (spoke with Dr. Erlinda Hong); SLP to address therapeutic exercise.  Allow occasional ice chips after oral care.    Swallow Evaluation Recommendations       SLP Diet Recommendations: NPO;Alternative means - temporary                       Oral Care Recommendations: Oral care QID      [name] L. Tivis Ringer, [place] Office number [phone] Pager [phone]   [name] 04/02/2018,1:52 PM

## 2018-04-02 NOTE — Plan of Care (Signed)
Pt retaining urine; Bladder scan showed over 670ml, MD order received to I/O cath

## 2018-04-02 NOTE — Progress Notes (Signed)
OT Cancellation Note  Patient Details Name: Nathan Cook MRN: [id number] DOB: 08/27/1921   Cancelled Treatment:    Reason Eval/Treat Not Completed: Patient at procedure or test/ unavailable(MRI)  [place], OT/L   Acute OT Clinical Specialist Acute Rehabilitation Services Pager [phone] Office [phone]  04/02/2018, 1:10 PM

## 2018-04-02 NOTE — Evaluation (Signed)
Clinical/Bedside Swallow Evaluation Patient Details  Name: Nathan Cook MRN: [id number] Date of Birth: 02/13/1922  Today's Date: 04/02/2018 Time: SLP Start Time (ACUTE ONLY): 1030 SLP Stop Time (ACUTE ONLY): 1042 SLP Time Calculation (min) (ACUTE ONLY): 12 min  Past Medical History:  Past Medical History:  Diagnosis Date  . BPH (benign prostatic hyperplasia) 10/14/2014  . Cervicalgia 01/04/2016  . Contusion of left shoulder 05/27/14  . DM type 2 (diabetes mellitus, type 2) ([place])   . High blood pressure   . History of basal cell cancer 11/21/2005   Right temple  . History of colonic polyps 10/14/2003  . Hyperlipidemia   . Left knee pain 05/27/14  . SAH (subarachnoid hemorrhage) ([place]) 2009   TBI  . TIA (transient ischemic attack) 11/06/2005   Left eye pain. Slurred speech. Diaphoresis. No residual effects.    Past Surgical History:  Past Surgical History:  Procedure Laterality Date  . Achilles tendon repair Left 1998   ruptured tendon  . COLONOSCOPY  1992   Polyp removed  . COLONOSCOPY  1998   normal  . ORIF FEMUR FRACTURE Right 1929  . TONSILLECTOMY     HPI:  84 y.o. male with PMH of HTN, HLD, DM, PVD presented with acute onset difficulty speech and swallowing, left-sided ataxia.  CT head showed stable atrophy and white matter disease, likely within normal limits for age. MRI pending.   Assessment / Plan / Recommendation Clinical Impression  Pt presents with significant s/s of dysphagia with consistent cough response elicited with ice chips and water - there was oral holding, multiple swallows per bolus, and explosive/wet coughing associated with each bolus.  Voice is weak, hypophonic; pt has difficulty modulating speech output with breath support.  Dr. Erlinda Hong notes suspicion of medullary infarct. Recommend proceeding with MBS today; maintain NPO.  D/W RN, pt.  SLP Visit Diagnosis: Dysphagia, unspecified (R13.10)    Aspiration Risk    moderate   Diet Recommendation   NPO  pending MBS       Other  Recommendations Oral Care Recommendations: Oral care QID   Follow up Recommendations        Frequency and Duration            Prognosis        Swallow Study   General Date of Onset: 04/01/18 HPI: 84 y.o. male with PMH of HTN, HLD, DM, PVD presented with acute onset difficulty speech and swallowing, left-sided ataxia.  CT head showed stable atrophy and white matter disease, likely within normal limits for age. MRI pending. Type of Study: Bedside Swallow Evaluation Previous Swallow Assessment: no Diet Prior to this Study: NPO Temperature Spikes Noted: No Respiratory Status: Room air History of Recent Intubation: No Behavior/Cognition: Alert;Cooperative Oral Cavity Assessment: Within Functional Limits Oral Care Completed by SLP: No Oral Cavity - Dentition: Adequate natural dentition Vision: Functional for self-feeding Self-Feeding Abilities: Able to feed self Patient Positioning: Upright in bed Baseline Vocal Quality: Other (comment)(hypophonia) Volitional Cough: Weak Volitional Swallow: Able to elicit    Oral/Motor/Sensory Function     Ice Chips Ice chips: Impaired Presentation: Self Fed Oral Phase Functional Implications: Prolonged oral transit Pharyngeal Phase Impairments: Wet Vocal Quality;Multiple swallows;Throat Clearing - Immediate;Cough - Immediate   Thin Liquid Thin Liquid: immediate cough/throat clearing   Nectar Thick Nectar Thick Liquid: Not tested   Honey Thick Honey Thick Liquid: Not tested   Puree Puree: Not tested   Solid     Solid: Not tested  [name] 04/02/2018,10:55 AM  Estill Bamberg L. Tivis Ringer, [place] Office number [phone] Pager [phone]

## 2018-04-02 NOTE — Evaluation (Signed)
Speech Language Pathology Evaluation Patient Details Name: Nathan Cook MRN: [id number] DOB: April 23, 1922 Today's Date: 04/02/2018 Time: 5784-6962 SLP Time Calculation (min) (ACUTE ONLY): 10 min  Problem List:  Patient Active Problem List   Diagnosis Date Noted  . Stroke ([place]) 04/01/2018  . PAD (peripheral artery disease) ([place]) 03/20/2018  . Generalized osteoarthritis 06/27/2017  . B12 deficiency 06/27/2017  . History of TIA (transient ischemic attack) 03/21/2017  . Vitamin D deficiency 03/21/2017  . Dry eyes, bilateral 03/21/2017  . CKD (chronic kidney disease) stage 3, GFR 30-59 ml/min ([facility]) 03/21/2017  . Cervicalgia 01/04/2016  . Pain in joint, shoulder region 05/11/2015  . BPH (benign prostatic hyperplasia) 10/14/2014  . Hyperlipidemia LDL goal <70   . Hypertension, essential   . Controlled type 2 diabetes mellitus with stage 3 chronic kidney disease, without long-term current use of insulin ([place])   . Left knee pain 05/27/2014  . Contusion of left shoulder 05/27/2014  . TIA (transient ischemic attack) 11/06/2005  . History of colonic polyps 10/14/2003   Past Medical History:  Past Medical History:  Diagnosis Date  . BPH (benign prostatic hyperplasia) 10/14/2014  . Cervicalgia 01/04/2016  . Contusion of left shoulder 05/27/14  . DM type 2 (diabetes mellitus, type 2) ([place])   . High blood pressure   . History of basal cell cancer 11/21/2005   Right temple  . History of colonic polyps 10/14/2003  . Hyperlipidemia   . Left knee pain 05/27/14  . SAH (subarachnoid hemorrhage) ([place]) 2009   TBI  . TIA (transient ischemic attack) 11/06/2005   Left eye pain. Slurred speech. Diaphoresis. No residual effects.    Past Surgical History:  Past Surgical History:  Procedure Laterality Date  . Achilles tendon repair Left 1998   ruptured tendon  . COLONOSCOPY  1992   Polyp removed  . COLONOSCOPY  1998   normal  . ORIF FEMUR FRACTURE Right 1929  . TONSILLECTOMY     HPI:  84  y.o. male with PMH of HTN, HLD, DM, PVD presented with acute onset difficulty speech and swallowing, left-sided ataxia.  CT head showed stable atrophy and white matter disease, likely within normal limits for age. MRI pending.   Assessment / Plan / Recommendation Clinical Impression  Pt presents with functional expressive/receptive language - follows three step commands, answers yes/no questions reliably, repeats words/sentences.  He is fully oriented. Output is fluent, but intelligibility is impacted by difficulty modulating respiratory support for speech and hoarse voice with low volume.  SLP will follow for communication and deeper assessment of cognitive abilities.  Will await OT/PT input re: disposition.     SLP Assessment  SLP Recommendation/Assessment: Patient needs continued Speech Lanaguage Pathology Services SLP Visit Diagnosis: Cognitive communication deficit (R41.841)    Follow Up Recommendations  (tba)    Frequency and Duration min 2x/week  1 week      SLP Evaluation Cognition  Overall Cognitive Status: Within Functional Limits for tasks assessed Arousal/Alertness: Awake/alert Orientation Level: Oriented X4 Attention: Sustained Sustained Attention: Appears intact Awareness: Appears intact       Comprehension  Auditory Comprehension Overall Auditory Comprehension: Appears within functional limits for tasks assessed Yes/No Questions: Within Functional Limits Commands: Within Functional Limits Conversation: Simple Visual Recognition/Discrimination Discrimination: Within Function Limits Reading Comprehension Reading Status: Not tested    Expression Expression Primary Mode of Expression: Verbal Verbal Expression Initiation: No impairment Automatic Speech: Social Response Level of Generative/Spontaneous Verbalization: Sentence Repetition: No impairment Naming: No impairment Written Expression Written Expression:  Not tested   Oral / Motor  Oral Motor/Sensory  Function Overall Oral Motor/Sensory Function: Mild impairment Facial Symmetry: Suspected CN VII (facial) dysfunction;Abnormal symmetry right Lingual Symmetry: Abnormal symmetry right;Suspected CN XII (hypoglossal) dysfunction Motor Speech Overall Motor Speech: Impaired Respiration: Impaired Phonation: Hoarse;Low vocal intensity Resonance: Within functional limits Intelligibility: Intelligibility reduced Conversation: 75-100% accurate   GO                    Nathan Cook 04/02/2018, 11:02 AM Nathan Cook, [place] Office number [phone] Pager [phone]

## 2018-04-02 NOTE — Progress Notes (Signed)
  Echocardiogram 2D Echocardiogram has been performed.  Mubarak Bevens L Androw 04/02/2018, 10:28 AM

## 2018-04-02 NOTE — Progress Notes (Signed)
STROKE TEAM PROGRESS NOTE   SUBJECTIVE (INTERVAL HISTORY) His RN is at the bedside.  Overall he feels his condition is gradually improving. Pt stated that his secretion is getting less and his voice is louder. On exam, he has nystagmus but left hand FTN improved. However, his right UE seems to have drift and weakness, but he stated that he had right shoulder arthritis and today he felt pain on the right shoulder which was not there yesterday. Lip and tongue swelling are gone.    OBJECTIVE Temp:  [96.2 F (35.7 C)-97.6 F (36.4 C)] 97.3 F (36.3 C) (10/08 0800) Pulse Rate:  [67-88] 71 (10/08 0800) Cardiac Rhythm: Normal sinus rhythm (10/08 0800) Resp:  [9-34] 26 (10/08 0800) BP: (106-177)/(61-120) 157/69 (10/08 0800) SpO2:  [74 %-100 %] 93 % (10/08 0800) Weight:  [65.5 kg-67.2 kg] 65.5 kg (10/07 1630)  Recent Labs  Lab 04/01/18 1514 04/01/18 1925 04/01/18 2305 04/02/18 0310 04/02/18 0730  GLUCAP 173* 210* 209* 209* 197*   Recent Labs  Lab 04/01/18 1432 04/01/18 1438 04/02/18 0259  NA 135 137 135  K 4.2 4.2 4.0  CL 103 104 103  CO2 22  --  22  GLUCOSE 161* 162* 205*  BUN 19 20 24*  CREATININE 1.51* 1.50* 1.34*  CALCIUM 9.4  --  8.8*   Recent Labs  Lab 04/01/18 1432  AST 25  ALT 19  ALKPHOS 57  BILITOT 0.9  PROT 6.6  ALBUMIN 3.7   Recent Labs  Lab 04/01/18 1432 04/01/18 1438 04/02/18 0259  WBC 8.9  --  8.2  NEUTROABS 6.3  --   --   HGB 12.7* 12.2* 11.8*  HCT 38.4* 36.0* 35.3*  MCV 104.9*  --  101.1*  PLT 317  --  287   No results for input(s): CKTOTAL, CKMB, CKMBINDEX, TROPONINI in the last 168 hours. Recent Labs    04/01/18 1432  LABPROT 13.7  INR 1.06   No results for input(s): COLORURINE, LABSPEC, PHURINE, GLUCOSEU, HGBUR, BILIRUBINUR, KETONESUR, PROTEINUR, UROBILINOGEN, NITRITE, LEUKOCYTESUR in the last 72 hours.  Invalid input(s): APPERANCEUR     Component Value Date/Time   CHOL 143 04/02/2018 0259   TRIG 29 04/02/2018 0259   HDL 57  04/02/2018 0259   CHOLHDL 2.5 04/02/2018 0259   VLDL 6 04/02/2018 0259   LDLCALC 80 04/02/2018 0259   LDLCALC 78 03/13/2018 0000   Lab Results  Component Value Date   HGBA1C 7.6 (H) 04/02/2018   No results found for: LABOPIA, COCAINSCRNUR, LABBENZ, AMPHETMU, THCU, LABBARB  No results for input(s): ETH in the last 168 hours.  I have personally reviewed the radiological images below and agree with the radiology interpretations.  Ct Angio Head W Or Wo Contrast  Result Date: 04/01/2018 CLINICAL DATA:  Stroke EXAM: CT ANGIOGRAPHY HEAD AND NECK TECHNIQUE: Multidetector CT imaging of the head and neck was performed using the standard protocol during bolus administration of intravenous contrast. Multiplanar CT image reconstructions and MIPs were obtained to evaluate the vascular anatomy. Carotid stenosis measurements (when applicable) are obtained utilizing NASCET criteria, using the distal internal carotid diameter as the denominator. CONTRAST:  28mL ISOVUE-370 IOPAMIDOL (ISOVUE-370) INJECTION 76% COMPARISON:  CT head 04/01/2018 FINDINGS: CTA NECK FINDINGS Aortic arch: Atherosclerotic calcification aortic arch and proximal great vessels. Proximal great vessels widely patent. Right carotid system: Atherosclerotic calcification right carotid bulb without significant stenosis. Left carotid system: Atherosclerotic calcification left carotid bulb. 25% diameter stenosis internal carotid artery on the left. Remainder of the left internal  carotid artery widely patent. Vertebral arteries: Right vertebral artery dominant. Moderate stenosis origin of right vertebral artery. Remainder of the right vertebral artery is widely patent to the basilar with mild atherosclerotic disease distally. Moderate stenosis at the origin of the left vertebral artery. Left vertebral artery diffusely diseased and occludes at the C1 level and does not continue to the basilar. Left PICA not visualized. Skeleton: Cervical spondylosis  without acute skeletal abnormality. Other neck: Negative Upper chest: Mild bronchiectasis and scarring in the right upper lobe. 5 mm right upper lobe nodule axial image 158 Review of the MIP images confirms the above findings CTA HEAD FINDINGS Anterior circulation: Atherosclerotic calcification in the cavernous carotid bilaterally causing mild stenosis bilaterally. Anterior and middle cerebral arteries patent bilaterally without stenosis or large vessel occlusion Posterior circulation: Right vertebral artery patent to the basilar with mild calcific stenosis V4 segment. Right PICA is widely patent. Occlusion of left vertebral artery at the C1 level without reconstitution. Left PICA not visualized. Basilar widely patent. Mild atherosclerotic disease in the basilar artery. Superior cerebellar and posterior cerebral arteries patent bilaterally without stenosis. Venous sinuses: Patent Anatomic variants: None Delayed phase: Not performed Review of the MIP images confirms the above findings IMPRESSION: Mild atherosclerotic disease in the carotid bifurcation bilaterally. Atherosclerotic calcification and mild stenosis in the cavernous carotid bilaterally. Moderate stenosis at the origin of the vertebral artery bilaterally. Occlusion distal left vertebral artery at the C1 level. Left PICA occluded. This could be acute or chronic. Correlate with neurologic examination and MRI. These results were called by telephone at the time of interpretation on 04/01/2018 at 3:18 pm to Dr. Rosalin Hawking , who verbally acknowledged these results. Electronically Signed   By: Franchot Gallo M.D.   On: 04/01/2018 15:18   Ct Angio Neck W And/or Wo Contrast  Result Date: 04/01/2018 CLINICAL DATA:  Stroke EXAM: CT ANGIOGRAPHY HEAD AND NECK TECHNIQUE: Multidetector CT imaging of the head and neck was performed using the standard protocol during bolus administration of intravenous contrast. Multiplanar CT image reconstructions and MIPs were  obtained to evaluate the vascular anatomy. Carotid stenosis measurements (when applicable) are obtained utilizing NASCET criteria, using the distal internal carotid diameter as the denominator. CONTRAST:  57mL ISOVUE-370 IOPAMIDOL (ISOVUE-370) INJECTION 76% COMPARISON:  CT head 04/01/2018 FINDINGS: CTA NECK FINDINGS Aortic arch: Atherosclerotic calcification aortic arch and proximal great vessels. Proximal great vessels widely patent. Right carotid system: Atherosclerotic calcification right carotid bulb without significant stenosis. Left carotid system: Atherosclerotic calcification left carotid bulb. 25% diameter stenosis internal carotid artery on the left. Remainder of the left internal carotid artery widely patent. Vertebral arteries: Right vertebral artery dominant. Moderate stenosis origin of right vertebral artery. Remainder of the right vertebral artery is widely patent to the basilar with mild atherosclerotic disease distally. Moderate stenosis at the origin of the left vertebral artery. Left vertebral artery diffusely diseased and occludes at the C1 level and does not continue to the basilar. Left PICA not visualized. Skeleton: Cervical spondylosis without acute skeletal abnormality. Other neck: Negative Upper chest: Mild bronchiectasis and scarring in the right upper lobe. 5 mm right upper lobe nodule axial image 158 Review of the MIP images confirms the above findings CTA HEAD FINDINGS Anterior circulation: Atherosclerotic calcification in the cavernous carotid bilaterally causing mild stenosis bilaterally. Anterior and middle cerebral arteries patent bilaterally without stenosis or large vessel occlusion Posterior circulation: Right vertebral artery patent to the basilar with mild calcific stenosis V4 segment. Right PICA is widely patent. Occlusion of  left vertebral artery at the C1 level without reconstitution. Left PICA not visualized. Basilar widely patent. Mild atherosclerotic disease in the  basilar artery. Superior cerebellar and posterior cerebral arteries patent bilaterally without stenosis. Venous sinuses: Patent Anatomic variants: None Delayed phase: Not performed Review of the MIP images confirms the above findings IMPRESSION: Mild atherosclerotic disease in the carotid bifurcation bilaterally. Atherosclerotic calcification and mild stenosis in the cavernous carotid bilaterally. Moderate stenosis at the origin of the vertebral artery bilaterally. Occlusion distal left vertebral artery at the C1 level. Left PICA occluded. This could be acute or chronic. Correlate with neurologic examination and MRI. These results were called by telephone at the time of interpretation on 04/01/2018 at 3:18 pm to Dr. Rosalin Hawking , who verbally acknowledged these results. Electronically Signed   By: Franchot Gallo M.D.   On: 04/01/2018 15:18   Ct Head Code Stroke Wo Contrast  Result Date: 04/01/2018 CLINICAL DATA:  Code stroke. Code stroke. Difficulty swallowing. Left-sided weakness. Last seen normal 1 hour ago. EXAM: CT HEAD WITHOUT CONTRAST TECHNIQUE: Contiguous axial images were obtained from the base of the skull through the vertex without intravenous contrast. COMPARISON:  CT head without contrast 10/04/2017 FINDINGS: Brain: Atrophy and white matter changes are within normal limits for age. Basal ganglia are intact. No acute infarct, hemorrhage, or mass lesion is present. Insular cortex is within normal limits. No acute or focal cortical abnormality is present ventricles are proportionate to the degree of atrophy. No significant extra-axial fluid collection is present. The brainstem and cerebellum are normal. Vascular: Atherosclerotic calcifications are present the cavernous internal carotid arteries bilaterally and at the dural margin of both vertebral arteries. There is no hyperdense vessel. Skull: Calvarium is intact. No focal lytic or blastic lesions are present. Sinuses/Orbits: Chronic left maxillary sinus  disease present. The paranasal sinuses and mastoid air cells are otherwise clear. Globes and orbits are within normal limits bilaterally. ASPECTS [facility] Stroke Program Early CT Score) - Ganglionic level infarction (caudate, lentiform nuclei, internal capsule, insula, M1-M3 cortex): 7/7 - Supraganglionic infarction (M4-M6 cortex): 3/3 Total score (0-10 with 10 being normal): 10/10 IMPRESSION: 1. Stable atrophy and white matter disease, likely within normal limits for age. 2. No acute intracranial abnormality. 3. Atherosclerosis. 4. ASPECTS is 10/10 The above was relayed via text pager to Dr. Erlinda Hong on 04/01/2018 at 14:49 . Electronically Signed   By: San Morelle M.D.   On: 04/01/2018 14:51   MRI pending  TTE pending   PHYSICAL EXAM  Temp:  [96.2 F (35.7 C)-97.6 F (36.4 C)] 97.3 F (36.3 C) (10/08 0800) Pulse Rate:  [67-88] 71 (10/08 0800) Resp:  [9-34] 26 (10/08 0800) BP: (106-177)/(61-120) 157/69 (10/08 0800) SpO2:  [74 %-100 %] 93 % (10/08 0800) Weight:  [65.5 kg-67.2 kg] 65.5 kg (10/07 1630)  General - Well nourished, well developed, in no apparent distress.  Ophthalmologic - fundi not visualized due to noncooperation.  Cardiovascular - Regular rate and rhythm.  Mental Status -  Level of arousal and orientation to time, place, and person were intact. Language including expression, naming, repetition, comprehension was assessed and found intact. However, still significant hypophonia and hoarseness.   Cranial Nerves II - XII - II - Visual field intact OU. III, IV, VI - Extraocular movements intact. V - Facial sensation intact bilaterally. VII - Facial movement intact bilaterally. VIII - Hearing & vestibular intact bilaterally, rotational nystagmus bilateral direction on horizontal gaze. X - Palate elevates symmetrically, significant hypophonia and hoarseness. XI Geryl Councilman  turning & shoulder shrug intact bilaterally. XII - Tongue protrusion intact.  Motor Strength - The  patient's strength was normal in all extremities except right UE drift for which pt stated arthritic pain at right shoulder.  Bulk was normal and fasciculations were absent.   Motor Tone - Muscle tone was assessed at the neck and appendages and was normal.  Reflexes - The patient's reflexes were symmetrical in all extremities and he had no pathological reflexes.  Sensory - Light touch, temperature/pinprick were assessed and were symmetrical.    Coordination - The patient had normal movements in the right hand today but dysmetria on the left hand FTN proportional to the weakness.  Tremor was absent.  Gait and Station - deferred.   ASSESSMENT/PLAN [name] is a 84 y.o. male with history of HTN, HLD, DM, PVD admitted for acute onset difficulty speech and swallowing, left-sided ataxia.  TPA given.    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   Resultant nystagmus, ataxia, hoarseness, swallow difficulty  MRI  pending  CTA head and neck - left VA occlusion at V4 segment and left PICA occlusion  2D Echo  pending  LDL 80  HgbA1c 7.6  SCDs for VTE prophylaxis  NPO  aspirin 325 mg daily prior to admission, now on No antithrombotic within 24h of tPA  Patient counseled to be compliant with his antithrombotic medications  Ongoing aggressive stroke risk factor management  Therapy recommendations:  pending  Disposition:  pending  Diabetes  HgbA1c 7.6 goal < 7.0  Uncontrolled  Currently mild hyperglycemia  CBG monitoring  SSI  close PCP follow up  Hypertension . Stable . Permissive hypertension (OK if <180/105) for 24-48 hours post stroke and then gradually normalized within 5-7 days.  Long term BP goal normotensive  Hyperlipidemia  Home meds:  zocor 10   LDL 80, goal < 70  Will increase to zocor 20 once po access  Continue statin at discharge  Dysphagia  Symptoms of swallow difficulty and  hoarseness  Pending speech  Currently NPO  On IVF  Other Stroke Risk Factors  Advanced age  PVD  Other Active Problems  CKD III - Cre 1.51->1.34 - on IVF  Hospital day # 1  This patient is critically ill due to brainstem stroke, dysphagia, nystagmus, s/p tPA and at significant risk of neurological worsening, death form ICH, bleeding, heart failure. This patient's care requires constant monitoring of vital signs, hemodynamics, respiratory and cardiac monitoring, review of medical records, neurological assessment, discussion with family, other specialists and medical decision making of high complexity. I spent 40 minutes of neurocritical care time in the care of this patient.   Rosalin Hawking, MD PhD Stroke Neurology 04/02/2018 9:49 AM    To contact Stroke Continuity provider, please refer to [email or web address] After hours, contact General Neurology

## 2018-04-02 NOTE — Progress Notes (Signed)
PT Cancellation Note  Patient Details Name: Nathan Cook MRN: [id number] DOB: 24-Jun-1922   Cancelled Treatment:    Reason Eval/Treat Not Completed: Patient at procedure or test/unavailable;Patient not medically ready.  Pt at MRI. 04/02/2018  Donnella Sham, [place] [phone]  (pager) [phone]  (office)   Tessie Fass [place] 04/02/2018, 1:33 PM

## 2018-04-03 ENCOUNTER — Inpatient Hospital Stay (HOSPITAL_COMMUNITY): Payer: Medicare Other

## 2018-04-03 DIAGNOSIS — I609 Nontraumatic subarachnoid hemorrhage, unspecified: Secondary | ICD-10-CM

## 2018-04-03 DIAGNOSIS — D72829 Elevated white blood cell count, unspecified: Secondary | ICD-10-CM

## 2018-04-03 LAB — BASIC METABOLIC PANEL
ANION GAP: 9 (ref 5–15)
BUN: 28 mg/dL — ABNORMAL HIGH (ref 8–23)
CALCIUM: 8.6 mg/dL — AB (ref 8.9–10.3)
CO2: 22 mmol/L (ref 22–32)
Chloride: 105 mmol/L (ref 98–111)
Creatinine, Ser: 1.36 mg/dL — ABNORMAL HIGH (ref 0.61–1.24)
GFR, EST AFRICAN AMERICAN: 50 mL/min — AB (ref >60)
GFR, EST NON AFRICAN AMERICAN: 43 mL/min — AB (ref >60)
Glucose, Bld: 148 mg/dL — ABNORMAL HIGH (ref 70–99)
POTASSIUM: 3.7 mmol/L (ref 3.5–5.1)
Sodium: 136 mmol/L (ref 135–145)

## 2018-04-03 LAB — CBC
HEMATOCRIT: 36.2 % — AB (ref 39.0–52.0)
Hemoglobin: 12 g/dL — ABNORMAL LOW (ref 13.0–17.0)
MCH: 33.2 pg (ref 26.0–34.0)
MCHC: 33.1 g/dL (ref 30.0–36.0)
MCV: 100.3 fL — ABNORMAL HIGH (ref 80.0–100.0)
NRBC: 0 % (ref 0.0–0.2)
Platelets: 365 10*3/uL (ref 150–400)
RBC: 3.61 MIL/uL — ABNORMAL LOW (ref 4.22–5.81)
RDW: 13.2 % (ref 11.5–15.5)
WBC: 24 10*3/uL — AB (ref 4.0–10.5)

## 2018-04-03 LAB — GLUCOSE, CAPILLARY
GLUCOSE-CAPILLARY: 133 mg/dL — AB (ref 70–99)
GLUCOSE-CAPILLARY: 125 mg/dL — AB (ref 70–99)
GLUCOSE-CAPILLARY: 131 mg/dL — AB (ref 70–99)
Glucose-Capillary: 135 mg/dL — ABNORMAL HIGH (ref 70–99)
Glucose-Capillary: 148 mg/dL — ABNORMAL HIGH (ref 70–99)
Glucose-Capillary: 129 mg/dL — ABNORMAL HIGH (ref 70–99)

## 2018-04-03 LAB — URINALYSIS, COMPLETE (UACMP) WITH MICROSCOPIC
BILIRUBIN URINE: NEGATIVE
Bacteria, UA: NONE SEEN
GLUCOSE, UA: 50 mg/dL — AB
KETONES UR: 20 mg/dL — AB
LEUKOCYTES UA: NEGATIVE
NITRITE: NEGATIVE
PH: 5 (ref 5.0–8.0)
PROTEIN: 30 mg/dL — AB
Specific Gravity, Urine: 1.027 (ref 1.005–1.030)

## 2018-04-03 MED ORDER — LABETALOL HCL 5 MG/ML IV SOLN: 10.0000 mg | INTRAVENOUS | Status: DC | PRN

## 2018-04-03 MED ORDER — HEPARIN SODIUM (PORCINE) 5000 UNIT/ML IJ SOLN
5000.0000 [IU] | Freq: Three times a day (TID) | INTRAMUSCULAR | Status: DC
  Administered 2018-04-03 – 2018-04-09 (×18): 5000 [IU] via SUBCUTANEOUS
  Filled 2018-04-03 (×19): qty 1

## 2018-04-03 NOTE — NC FL2 (Signed)
[place] LEVEL OF CARE SCREENING TOOL     IDENTIFICATION  Patient Name: Nathan Cook Birthdate: 1922/04/30 Sex: male Admission Date (Current Location): 04/01/2018  [facility] and [place] Number:  Herbalist and Address:  [facility] [place], [place] [address] [place], [place] [address]      Provider Number: [id number]  Attending Physician Name and Address:  Rosalin Hawking, MD  Relative Name and Phone Number:  Carver Fila; daughter; [phone]    Current Level of Care: Hospital Recommended Level of Care: [place] Prior Approval Number:    Date Approved/Denied:   PASRR Number: [id number]  Discharge Plan: SNF    Current Diagnoses: Patient Active Problem List   Diagnosis Date Noted  . Stroke ([place]) 04/01/2018  . PAD (peripheral artery disease) ([place]) 03/20/2018  . Generalized osteoarthritis 06/27/2017  . B12 deficiency 06/27/2017  . History of TIA (transient ischemic attack) 03/21/2017  . Vitamin D deficiency 03/21/2017  . Dry eyes, bilateral 03/21/2017  . CKD (chronic kidney disease) stage 3, GFR 30-59 ml/min (HCC) 03/21/2017  . Cervicalgia 01/04/2016  . Pain in joint, shoulder region 05/11/2015  . BPH (benign prostatic hyperplasia) 10/14/2014  . Hyperlipidemia LDL goal <70   . Hypertension, essential   . Controlled type 2 diabetes mellitus with stage 3 chronic kidney disease, without long-term current use of insulin ([place])   . Left knee pain 05/27/2014  . Contusion of left shoulder 05/27/2014  . TIA (transient ischemic attack) 11/06/2005  . History of colonic polyps 10/14/2003    Orientation RESPIRATION BLADDER Height & Weight     Self, Time, Situation, Place  Normal Incontinent, External catheter Weight: 144 lb 6.4 oz (65.5 kg) Height:  5\' 9"  (175.3 cm)  BEHAVIORAL SYMPTOMS/MOOD NEUROLOGICAL BOWEL NUTRITION STATUS      Continent Diet(see discharge summary)  AMBULATORY STATUS COMMUNICATION OF NEEDS Skin    Extensive Assist Verbally Normal                       Personal Care Assistance Level of Assistance  Bathing, Feeding, Dressing Bathing Assistance: Maximum assistance Feeding assistance: Limited assistance Dressing Assistance: Maximum assistance     Functional Limitations Info  Speech, Hearing, Sight Sight Info: Adequate Hearing Info: Adequate Speech Info: Impaired    SPECIAL CARE FACTORS FREQUENCY  PT (By licensed PT), OT (By licensed OT), Speech therapy     PT Frequency: 5x week OT Frequency: 5x week     Speech Therapy Frequency: 5x week       Contractures Contractures Info: Not present    Additional Factors Info  Code Status, Allergies, Insulin Sliding Scale Code Status Info: Full Code Allergies Info: No Known Allergies   Insulin Sliding Scale Info: insulin aspart (novoLOG) injection 0-9 Units every 4 hours       Current Medications (04/03/2018):  This is the current hospital active medication list Current Facility-Administered Medications  Medication Dose Route Frequency Provider Last Rate Last Dose  . 0.9 %  sodium chloride infusion   Intravenous Continuous Rosalin Hawking, MD 65 mL/hr at 04/03/18 1155    . acetaminophen (TYLENOL) tablet 650 mg  650 mg Oral Q4H PRN Rosalin Hawking, MD       Or  . acetaminophen (TYLENOL) solution 650 mg  650 mg Per Tube Q4H PRN Rosalin Hawking, MD       Or  . acetaminophen (TYLENOL) suppository 650 mg  650 mg Rectal Q4H PRN Rosalin Hawking, MD      .  chlorhexidine (PERIDEX) 0.12 % solution 15 mL  15 mL Mouth Rinse BID Rosalin Hawking, MD   15 mL at 04/03/18 1009  . famotidine (PEPCID) IVPB 20 mg premix  20 mg Intravenous Q12H Rosalin Hawking, MD 100 mL/hr at 04/03/18 1208 20 mg at 04/03/18 1208  . heparin injection 5,000 Units  5,000 Units Subcutaneous Q8H Rosalin Hawking, MD      . insulin aspart (novoLOG) injection 0-9 Units  0-9 Units Subcutaneous Q4H Rosalin Hawking, MD   1 Units at 04/03/18 1215  . labetalol (NORMODYNE,TRANDATE) injection 10-20 mg   10-20 mg Intravenous Q2H PRN Rosalin Hawking, MD      . MEDLINE mouth rinse  15 mL Mouth Rinse q12n4p Rosalin Hawking, MD   15 mL at 04/03/18 1209     Discharge Medications: Please see discharge summary for a list of discharge medications.  Relevant Imaging Results:  Relevant Lab Results:   Additional Information SS# [id number]  [place], [place]

## 2018-04-03 NOTE — Progress Notes (Addendum)
STROKE TEAM PROGRESS NOTE   SUBJECTIVE (INTERVAL HISTORY) His RN is at the bedside.  He had delirium overnight and currently on b/l mitten with abdominal binder. Not orientated to place, but orientated to time, people and self and age. Did not pass swallow. MRI showed medullary infarct.    OBJECTIVE Temp:  [97.3 F (36.3 C)-99 F (37.2 C)] 99 F (37.2 C) (10/09 0800) Pulse Rate:  [47-107] 104 (10/09 0500) Cardiac Rhythm: Sinus tachycardia (10/09 0400) Resp:  [17-27] 27 (10/09 0500) BP: (96-153)/(45-95) 119/62 (10/09 0500) SpO2:  [89 %-100 %] 94 % (10/09 0800)  Recent Labs  Lab 04/02/18 0310 04/02/18 0730 04/02/18 1115 04/02/18 1643 04/02/18 1918  GLUCAP 209* 197* 163* 175* 167*   Recent Labs  Lab 04/01/18 1432 04/01/18 1438 04/02/18 0259 04/03/18 0304  NA 135 137 135 136  K 4.2 4.2 4.0 3.7  CL 103 104 103 105  CO2 22  --  22 22  GLUCOSE 161* 162* 205* 148*  BUN 19 20 24* 28*  CREATININE 1.51* 1.50* 1.34* 1.36*  CALCIUM 9.4  --  8.8* 8.6*   Recent Labs  Lab 04/01/18 1432  AST 25  ALT 19  ALKPHOS 57  BILITOT 0.9  PROT 6.6  ALBUMIN 3.7   Recent Labs  Lab 04/01/18 1432 04/01/18 1438 04/02/18 0259 04/03/18 0304  WBC 8.9  --  8.2 24.0*  NEUTROABS 6.3  --   --   --   HGB 12.7* 12.2* 11.8* 12.0*  HCT 38.4* 36.0* 35.3* 36.2*  MCV 104.9*  --  101.1* 100.3*  PLT 317  --  287 365   No results for input(s): CKTOTAL, CKMB, CKMBINDEX, TROPONINI in the last 168 hours. Recent Labs    04/01/18 1432  LABPROT 13.7  INR 1.06   No results for input(s): COLORURINE, LABSPEC, PHURINE, GLUCOSEU, HGBUR, BILIRUBINUR, KETONESUR, PROTEINUR, UROBILINOGEN, NITRITE, LEUKOCYTESUR in the last 72 hours.  Invalid input(s): APPERANCEUR     Component Value Date/Time   CHOL 143 04/02/2018 0259   TRIG 29 04/02/2018 0259   HDL 57 04/02/2018 0259   CHOLHDL 2.5 04/02/2018 0259   VLDL 6 04/02/2018 0259   LDLCALC 80 04/02/2018 0259   LDLCALC 78 03/13/2018 0000   Lab Results   Component Value Date   HGBA1C 7.6 (H) 04/02/2018   No results found for: LABOPIA, COCAINSCRNUR, LABBENZ, AMPHETMU, THCU, LABBARB  No results for input(s): ETH in the last 168 hours.  I have personally reviewed the radiological images below and agree with the radiology interpretations.  Ct Angio Head W Or Wo Contrast  Result Date: 04/01/2018 CLINICAL DATA:  Stroke EXAM: CT ANGIOGRAPHY HEAD AND NECK TECHNIQUE: Multidetector CT imaging of the head and neck was performed using the standard protocol during bolus administration of intravenous contrast. Multiplanar CT image reconstructions and MIPs were obtained to evaluate the vascular anatomy. Carotid stenosis measurements (when applicable) are obtained utilizing NASCET criteria, using the distal internal carotid diameter as the denominator. CONTRAST:  34mL ISOVUE-370 IOPAMIDOL (ISOVUE-370) INJECTION 76% COMPARISON:  CT head 04/01/2018 FINDINGS: CTA NECK FINDINGS Aortic arch: Atherosclerotic calcification aortic arch and proximal great vessels. Proximal great vessels widely patent. Right carotid system: Atherosclerotic calcification right carotid bulb without significant stenosis. Left carotid system: Atherosclerotic calcification left carotid bulb. 25% diameter stenosis internal carotid artery on the left. Remainder of the left internal carotid artery widely patent. Vertebral arteries: Right vertebral artery dominant. Moderate stenosis origin of right vertebral artery. Remainder of the right vertebral artery is widely patent to  the basilar with mild atherosclerotic disease distally. Moderate stenosis at the origin of the left vertebral artery. Left vertebral artery diffusely diseased and occludes at the C1 level and does not continue to the basilar. Left PICA not visualized. Skeleton: Cervical spondylosis without acute skeletal abnormality. Other neck: Negative Upper chest: Mild bronchiectasis and scarring in the right upper lobe. 5 mm right upper lobe  nodule axial image 158 Review of the MIP images confirms the above findings CTA HEAD FINDINGS Anterior circulation: Atherosclerotic calcification in the cavernous carotid bilaterally causing mild stenosis bilaterally. Anterior and middle cerebral arteries patent bilaterally without stenosis or large vessel occlusion Posterior circulation: Right vertebral artery patent to the basilar with mild calcific stenosis V4 segment. Right PICA is widely patent. Occlusion of left vertebral artery at the C1 level without reconstitution. Left PICA not visualized. Basilar widely patent. Mild atherosclerotic disease in the basilar artery. Superior cerebellar and posterior cerebral arteries patent bilaterally without stenosis. Venous sinuses: Patent Anatomic variants: None Delayed phase: Not performed Review of the MIP images confirms the above findings IMPRESSION: Mild atherosclerotic disease in the carotid bifurcation bilaterally. Atherosclerotic calcification and mild stenosis in the cavernous carotid bilaterally. Moderate stenosis at the origin of the vertebral artery bilaterally. Occlusion distal left vertebral artery at the C1 level. Left PICA occluded. This could be acute or chronic. Correlate with neurologic examination and MRI. These results were called by telephone at the time of interpretation on 04/01/2018 at 3:18 pm to Dr. Rosalin Hawking , who verbally acknowledged these results. Electronically Signed   By: Franchot Gallo M.D.   On: 04/01/2018 15:18   Dg Shoulder Right  Result Date: 04/02/2018 CLINICAL DATA:  Right shoulder pain for several years EXAM: RIGHT SHOULDER - 2+ VIEW COMPARISON:  None. FINDINGS: Degenerative changes of the acromioclavicular joint are seen. No acute fracture or dislocation is noted. The humeral head is high-riding consistent with chronic right-sided cuff injury. The underlying bony thorax is within normal limits. No soft tissue changes are seen. IMPRESSION: Chronic changes without acute  abnormality. Electronically Signed   By: Inez Catalina M.D.   On: 04/02/2018 14:17   Ct Head Wo Contrast  Result Date: 04/02/2018 CLINICAL DATA:  Stroke follow-up, 24 hours post tPA EXAM: CT HEAD WITHOUT CONTRAST TECHNIQUE: Contiguous axial images were obtained from the base of the skull through the vertex without intravenous contrast. COMPARISON:  Yesterday FINDINGS: Brain: Small volume subarachnoid hemorrhage within the left temporoparietal sulcus, in retrospect stable from prior. No visible infarct, mass, hydrocephalus, or collection. Vascular: Atherosclerotic calcification Skull: No acute or aggressive finding Sinuses/Orbits: Chronic left maxillary sinusitis. Critical Value/emergent results were called by telephone at the time of interpretation on 04/02/2018 at 1:57 pm to Dr. Rosalin Hawking , who verbally acknowledged these results. IMPRESSION: 1. Small volume subarachnoid hemorrhage along the left temporoparietal convexity, in retrospect stable from yesterday. 2. No visible acute infarct. Electronically Signed   By: Monte Fantasia M.D.   On: 04/02/2018 14:03   Ct Angio Neck W And/or Wo Contrast  Result Date: 04/01/2018 CLINICAL DATA:  Stroke EXAM: CT ANGIOGRAPHY HEAD AND NECK TECHNIQUE: Multidetector CT imaging of the head and neck was performed using the standard protocol during bolus administration of intravenous contrast. Multiplanar CT image reconstructions and MIPs were obtained to evaluate the vascular anatomy. Carotid stenosis measurements (when applicable) are obtained utilizing NASCET criteria, using the distal internal carotid diameter as the denominator. CONTRAST:  62mL ISOVUE-370 IOPAMIDOL (ISOVUE-370) INJECTION 76% COMPARISON:  CT head 04/01/2018 FINDINGS: CTA NECK  FINDINGS Aortic arch: Atherosclerotic calcification aortic arch and proximal great vessels. Proximal great vessels widely patent. Right carotid system: Atherosclerotic calcification right carotid bulb without significant stenosis.  Left carotid system: Atherosclerotic calcification left carotid bulb. 25% diameter stenosis internal carotid artery on the left. Remainder of the left internal carotid artery widely patent. Vertebral arteries: Right vertebral artery dominant. Moderate stenosis origin of right vertebral artery. Remainder of the right vertebral artery is widely patent to the basilar with mild atherosclerotic disease distally. Moderate stenosis at the origin of the left vertebral artery. Left vertebral artery diffusely diseased and occludes at the C1 level and does not continue to the basilar. Left PICA not visualized. Skeleton: Cervical spondylosis without acute skeletal abnormality. Other neck: Negative Upper chest: Mild bronchiectasis and scarring in the right upper lobe. 5 mm right upper lobe nodule axial image 158 Review of the MIP images confirms the above findings CTA HEAD FINDINGS Anterior circulation: Atherosclerotic calcification in the cavernous carotid bilaterally causing mild stenosis bilaterally. Anterior and middle cerebral arteries patent bilaterally without stenosis or large vessel occlusion Posterior circulation: Right vertebral artery patent to the basilar with mild calcific stenosis V4 segment. Right PICA is widely patent. Occlusion of left vertebral artery at the C1 level without reconstitution. Left PICA not visualized. Basilar widely patent. Mild atherosclerotic disease in the basilar artery. Superior cerebellar and posterior cerebral arteries patent bilaterally without stenosis. Venous sinuses: Patent Anatomic variants: None Delayed phase: Not performed Review of the MIP images confirms the above findings IMPRESSION: Mild atherosclerotic disease in the carotid bifurcation bilaterally. Atherosclerotic calcification and mild stenosis in the cavernous carotid bilaterally. Moderate stenosis at the origin of the vertebral artery bilaterally. Occlusion distal left vertebral artery at the C1 level. Left PICA occluded.  This could be acute or chronic. Correlate with neurologic examination and MRI. These results were called by telephone at the time of interpretation on 04/01/2018 at 3:18 pm to Dr. Rosalin Hawking , who verbally acknowledged these results. Electronically Signed   By: Franchot Gallo M.D.   On: 04/01/2018 15:18   Mr Brain Wo Contrast  Result Date: 04/02/2018 CLINICAL DATA:  Stroke. EXAM: MRI HEAD WITHOUT CONTRAST TECHNIQUE: Multiplanar, multiecho pulse sequences of the brain and surrounding structures were obtained without intravenous contrast. COMPARISON:  CT head 04/01/2018. CT head 04/02/2018. CTA head 04/01/2018. FINDINGS: Brain: Acute LEFT inferior medullary infarct, restricted diffusion, corresponding low ADC, no blood products, LEFT PICA territory, correlates with LEFT vertebral and PICA occlusion on CTA. Elsewhere no acute stroke, mass lesion, or hydrocephalus. Atrophy with chronic microvascular ischemic change. Acute subarachnoid blood in the LEFT posterior temporal region correlates with the CT abnormalities, and appears increased from the original CT, when technique differences are considered. No draining vein. There may be a tiny focus of parenchymal hemorrhage of an acute nature, see series 12, image 24. Etiology unknown, query occult trauma, with shearing injury. Vascular: Flow voids are maintained in the carotid, basilar, and RIGHT vertebral arteries. LEFT vertebral demonstrates lack of flow void, consistent with acute occlusion. Skull and upper cervical spine: Normal marrow signal. Sinuses/Orbits: Chronic sinus disease. Hypoplastic LEFT maxillary sinus. Negative orbits. Other: Normal mastoids. IMPRESSION: Acute LEFT medullary brainstem infarct correlating with recent LEFT vertebral and PICA occlusion. LEFT posterior temporal subarachnoid hemorrhage, uncertain etiology, appears to be increased volume from original CT, perhaps tPA related. Atrophy with chronic microvascular ischemic change. Electronically  Signed   By: Staci Righter M.D.   On: 04/02/2018 14:34   Ct Head Code Stroke Wo Contrast  Result Date: 04/01/2018 CLINICAL DATA:  Code stroke. Code stroke. Difficulty swallowing. Left-sided weakness. Last seen normal 1 hour ago. EXAM: CT HEAD WITHOUT CONTRAST TECHNIQUE: Contiguous axial images were obtained from the base of the skull through the vertex without intravenous contrast. COMPARISON:  CT head without contrast 10/04/2017 FINDINGS: Brain: Atrophy and white matter changes are within normal limits for age. Basal ganglia are intact. No acute infarct, hemorrhage, or mass lesion is present. Insular cortex is within normal limits. No acute or focal cortical abnormality is present ventricles are proportionate to the degree of atrophy. No significant extra-axial fluid collection is present. The brainstem and cerebellum are normal. Vascular: Atherosclerotic calcifications are present the cavernous internal carotid arteries bilaterally and at the dural margin of both vertebral arteries. There is no hyperdense vessel. Skull: Calvarium is intact. No focal lytic or blastic lesions are present. Sinuses/Orbits: Chronic left maxillary sinus disease present. The paranasal sinuses and mastoid air cells are otherwise clear. Globes and orbits are within normal limits bilaterally. ASPECTS [facility] Stroke Program Early CT Score) - Ganglionic level infarction (caudate, lentiform nuclei, internal capsule, insula, M1-M3 cortex): 7/7 - Supraganglionic infarction (M4-M6 cortex): 3/3 Total score (0-10 with 10 being normal): 10/10 IMPRESSION: 1. Stable atrophy and white matter disease, likely within normal limits for age. 2. No acute intracranial abnormality. 3. Atherosclerosis. 4. ASPECTS is 10/10 The above was relayed via text pager to Dr. Erlinda Hong on 04/01/2018 at 14:49 . Electronically Signed   By: San Morelle M.D.   On: 04/01/2018 14:51    PHYSICAL EXAM  Temp:  [97.3 F (36.3 C)-99 F (37.2 C)] 99 F (37.2 C) (10/09  0800) Pulse Rate:  [47-107] 104 (10/09 0500) Resp:  [17-27] 27 (10/09 0500) BP: (96-153)/(45-95) 119/62 (10/09 0500) SpO2:  [89 %-100 %] 94 % (10/09 0800)  General - Well nourished, well developed, mildly agitated  Ophthalmologic - fundi not visualized due to noncooperation.  Cardiovascular - Regular rate and rhythm.  Mental Status -  Level of arousal and orientation to time, self, age and person were intact, but not orientated to place Language including expression, naming, repetition, comprehension was assessed and found intact. Hypophonia and hoarseness are improving.   Cranial Nerves II - XII - II - Visual field intact OU. III, IV, VI - Extraocular movements intact. V - Facial sensation intact bilaterally. VII - Facial movement intact bilaterally. VIII - Hearing & vestibular intact bilaterally, horizontal nystagmus on left gaze, direction to the left. X - Palate elevates symmetrically, improved hypophonia and hoarseness. XI - Chin turning & shoulder shrug intact bilaterally. XII - Tongue protrusion intact.  Motor Strength - The patient's strength was normal in all extremities except right UE and LE drift.  Bulk was normal and fasciculations were absent.   Motor Tone - Muscle tone was assessed at the neck and appendages and was normal.  Reflexes - The patient's reflexes were symmetrical in all extremities and he had no pathological reflexes.  Sensory - Light touch, temperature/pinprick were assessed and were symmetrical.    Coordination - The patient had dysmetria on the left hand FTN proportional to the weakness.  Tremor was absent.  Gait and Station - deferred.   ASSESSMENT/PLAN Mr. MAXIM BEDEL is a 84 y.o. male with history of HTN, HLD, DM, PVD admitted for acute onset difficulty speech and swallowing, left-sided ataxia.  TPA given.    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   Ongoing aggressive stroke risk factor management  Therapy recommendations:  pending  Disposition:  Pending  Left posterior temporal small SAH  With retrospective review of CT head, it was present on the initial CT head  No change of small size on repeat CT and MRI  Seems not changed after tPA  Hold off antiplatelet at this time  Will repeat CT in am for monitoring  Leukocytosis  WBC 8.2->24.0  Etiology unclear, aspiration ??   afebrile  UA pending  Urine culture pending  CXR pending  Delirium   Due to ICU and hospitalization most likely  On restrain   Encourage out of bed with PT/OT  Consider seroquel when po access if needed  Repeat CT in am  Diabetes  HgbA1c 7.6 goal < 7.0  Uncontrolled  Mild hyperglycemia, improving  CBG monitoring  SSI  close PCP follow up  Hypertension . Stable  BP goal < 160  Hyperlipidemia  Home meds:  zocor 10   LDL 80, goal < 70  Will increase to zocor 20 once po access  Continue statin at discharge  Dysphagia  Symptoms of swallow difficulty and hoarseness  Did not pass swallow  Continue NPO for today  Speech continues to follow  Increase IVF to 65cc/h  Other Stroke Risk Factors  Advanced age  PVD  Other Active Problems  CKD III - Cre 1.51->1.34->1.36 - on IVF  Hospital day # 2  This patient is critically ill due to brainstem stroke, dysphagia, nystagmus, s/p tPA, SAH, delirium and at significant risk of neurological worsening, death form ICH, bleeding, heart failure. This patient's care requires constant monitoring of vital signs,  hemodynamics, respiratory and cardiac monitoring, review of medical records, neurological assessment, discussion with family, other specialists and medical decision making of high complexity. I spent 40 minutes of neurocritical care time in the care of this patient.   Rosalin Hawking, MD PhD Stroke Neurology 04/03/2018 10:12 AM    To contact Stroke Continuity provider, please refer to [email or web address] After hours, contact General Neurology

## 2018-04-03 NOTE — Social Work (Signed)
CSW acknowledging therapy recommendations, CSW following as pt becomes more medically appropriate for disposition. Pt from [facility], spoke with [facility] in admissions for SNF. They state that the community will have SNF bed available for pt and are following.  Alexander Mt, [place] Work [phone]

## 2018-04-03 NOTE — Evaluation (Signed)
Physical Therapy Evaluation Patient Details Name: Nathan Cook MRN: [id number] DOB: September 17, 1921 Today's Date: 04/03/2018   History of Present Illness  84 y.o. male with PMH of HTN, HLD, DM, PVD presented with acute onset difficulty speech and swallowing, left-sided ataxia.  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  PMH: DM; HTN; SAH (2009); TIA.   Clinical Impression  Despite doing well reporting his own history, pt had significant deficits cognitively in attention, sequencing, awareness, and safety.  He was able, with two person assist to stand and pivot to the recliner chair. He will need post acute rehab and family's preference is SNF level rehab at [facility] (where he currently resides and closer to his wife for her to be able to visit).   PT to follow acutely for deficits listed below.      Follow Up Recommendations SNF;Supervision for mobility/OOB;Other (comment)(family's preference is SNF at [facility])    Equipment Recommendations  Wheelchair (measurements PT);Wheelchair cushion (measurements PT);Rolling walker with 5" wheels;3in1 (PT)    Recommendations for Other Services   NA    Precautions / Restrictions Precautions Precautions: Fall Precaution Comments: R sided incoordination and weakness      Mobility  Bed Mobility Overal bed mobility: Needs Assistance Bed Mobility: Supine to Sit     Supine to sit: Max assist     General bed mobility comments: Pt needed assist to initiate movement to EOB, support at trunk and to help progress bil LEs EOB.  Pt having difficulty sequencing his movements.  Once upright trunkal lean (right and posterior) noted and needed support to maintain balance at EOB.   Transfers Overall transfer level: Needs assistance Equipment used: 2 person hand held assist Transfers: Sit to/from [facility] Sit to Stand: Max assist;+2 physical assistance Stand pivot transfers: Max assist;+2 physical  assistance       General transfer comment: difficulty coordinating steps due to apparent motor planning deficits  Ambulation/Gait             General Gait Details: not ready for gait yet      Modified Rankin (Stroke Patients Only) Modified Rankin (Stroke Patients Only) Pre-Morbid Rankin Score: No symptoms Modified Rankin: Severe disability     Balance Overall balance assessment: Needs assistance Sitting-balance support: Single extremity supported;Bilateral upper extremity supported;Feet supported Sitting balance-Leahy Scale: Poor Sitting balance - Comments: Up to max assist at EOB due to decreased awareness of midline.  Frequent LOB posteriorly and to the right.  When asked which way he is leaning he stated, "they tell me to the right" .  Pt can correct with frequent cues, but cannot maintain.    Standing balance support: Bilateral upper extremity supported Standing balance-Leahy Scale: Zero Standing balance comment: two person max assist in standing.                              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; involved  on committees; drove; cooked; enjoyed singing     Hand Dominance   Dominant Hand: Right    Extremity/Trunk Assessment   Upper Extremity Assessment Upper Extremity Assessment: Defer to OT evaluation RUE Deficits / Details: apparent shoulder dysfunctiona PTA; apparent sensory motor deficits; difficulty using  functionally due to motor planning difficulties; poor poroprioception of RUE; attempting to use RUE Sensation: decreased light touch;decreased proprioception RUE Coordination: decreased fine motor;decreased gross motor LUE Deficits / Details: generalized weakness LUE Coordination: decreased fine motor;decreased gross motor    Lower Extremity Assessment Lower Extremity Assessment: RLE deficits/detail RLE Deficits / Details: right leg with decreased lower leg sensation, decreased coordination and functional strength.  Per bed level strength assessment at least 3/5 ankle, knee, hip.  RLE Coordination: decreased gross motor    Cervical / Trunk Assessment Cervical / Trunk Assessment: Other exceptions(R a nd posterior bias) Cervical / Trunk Exceptions: poor awareness of midline  Communication   Communication: Expressive difficulties  Cognition Arousal/Alertness: Lethargic Behavior During Therapy: Impulsive Overall Cognitive Status: Impaired/Different from baseline Area of Impairment: Attention;Following commands;Safety/judgement;Awareness;Problem solving                   Current Attention Level: Sustained   Following Commands: Follows one step commands inconsistently Safety/Judgement: Decreased awareness of safety;Decreased awareness of deficits Awareness: Emergent Problem Solving: Slow processing;Difficulty sequencing;Requires verbal cues;Requires tactile cues General Comments: Max +2 to tranfer pt and pt states he thinks he could get back to bed after awhile on his own      General Comments General comments (skin integrity, edema, etc.): Family present to confirm history and observe session (wife, daughter, and son in law)        Assessment/Plan    PT Assessment Patient needs continued PT services  PT Problem List Decreased strength;Decreased activity tolerance;Decreased balance;Decreased mobility;Decreased coordination;Decreased cognition;Decreased knowledge of use of  DME;Decreased safety awareness       PT Treatment Interventions DME instruction;Gait training;Functional mobility training;Stair training;Therapeutic activities;Therapeutic exercise;Balance training;Neuromuscular re-education;Cognitive remediation;Patient/family education;Wheelchair mobility training    PT Goals (Current goals can be found in the Care Plan section)  Acute Rehab PT Goals Patient Stated Goal: to get better PT Goal Formulation: With patient/family Time For Goal Achievement: 04/17/18 Potential to Achieve Goals: Good    Frequency Min 3X/week        Co-evaluation PT/OT/SLP Co-Evaluation/Treatment: Yes Reason for Co-Treatment: Complexity of the patient's impairments (multi-system involvement);Necessary to address cognition/behavior during functional activity;For patient/therapist safety;To address functional/ADL transfers PT goals addressed during session: Mobility/safety with mobility;Balance;Strengthening/ROM OT goals addressed during session: ADL's and self-care       AM-PAC PT "6 Clicks" Daily Activity  Outcome Measure Difficulty turning over in bed (including adjusting bedclothes, sheets and blankets)?: Unable Difficulty moving from lying on back to sitting on the side of the bed? : Unable Difficulty sitting down on and standing up from a chair with arms (e.g., wheelchair, bedside commode, etc,.)?: Unable Help needed moving to and from a bed to chair (including a wheelchair)?: Total Help needed walking in hospital room?: Total Help needed climbing 3-5 steps with a railing? : Total 6 Click Score: 6    End of Session Equipment Utilized During Treatment: Gait belt Activity Tolerance: Patient tolerated treatment well Patient left: in chair;with call bell/phone within reach;with chair alarm set;with restraints reapplied;with family/visitor present;with nursing/sitter in room Nurse Communication: Mobility status PT Visit Diagnosis: Muscle weakness (generalized)  (M62.81);Difficulty in walking, not elsewhere classified (R26.2);Hemiplegia and hemiparesis Hemiplegia - Right/Left: Right Hemiplegia - dominant/non-dominant:  Dominant Hemiplegia - caused by: Cerebral infarction;Nontraumatic SAH    Time: 8473-0856 PT Time Calculation (min) (ACUTE ONLY): 44 min   Charges:      Wells Guiles B. Enslee Bibbins, PT, DPT  Acute Rehabilitation #[phone] pager #[phone] office    PT Evaluation $PT Eval Moderate Complexity: 1 Mod PT Treatments $Therapeutic Activity: 8-22 mins       04/03/2018, 2:59 PM

## 2018-04-03 NOTE — Progress Notes (Signed)
Occupational Therapy Evaluation Patient Details Name: Nathan Cook MRN: [id number] DOB: [birth year] Today's Date: 04/03/2018    History of Present Illness 84 y.o. male with PMH of HTN, HLD, DM, PVD presented with acute onset difficulty speech and swallowing, left-sided ataxia.  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  PMH: DM; HTN; SAH (2009); TIA.    Clinical Impression   PTA, pt lived in [place] at [facility], was independent with ADL and IADL tasks, drove and was involved in committees at his residence. Pt presents with significant impairments, requiring Max A +2 for mobility and Max A with ADL due to deficits listed below.  Poor insight and awareness of deficits. Pt will benefit form rehab at [facility] where he resides at [facility]. Will follow acutely.  BP EOB 183/84; after transferring to chair 174/80.     Follow Up Recommendations  SNF;Supervision/Assistance - 24 hour    Equipment Recommendations  3 in 1 bedside commode    Recommendations for Other Services       Precautions / Restrictions Precautions Precautions: Fall      Mobility Bed Mobility Overal bed mobility: Needs Assistance Bed Mobility: Supine to Sit     Supine to sit: Max assist        Transfers Overall transfer level: Needs assistance Equipment used: 2 person hand held assist Transfers: Sit to/from [employer] Transfers Sit to Stand: Max assist;+2 physical assistance Stand pivot transfers: Max assist;+2 physical assistance       General transfer comment: difficulty coordinating steps due to apparent motor planning deficits and ataxia.     Balance Overall balance assessment: Needs assistance   Sitting balance-Leahy Scale: Poor       Standing balance-Leahy Scale: Zero                             ADL either performed or assessed with clinical judgement   ADL Overall ADL's : Needs assistance/impaired Eating/Feeding:  NPO   Grooming: Maximal assistance;Sitting   Upper Body Bathing: Maximal assistance;Sitting   Lower Body Bathing: Total assistance;Sit to/from stand   Upper Body Dressing : Maximal assistance;Sitting   Lower Body Dressing: Total assistance;Sit to/from stand   Toilet Transfer: +2 for physical assistance;Maximal assistance Toilet Transfer Details (indicate cue type and reason): simulated Toileting- Clothing Manipulation and Hygiene: Total assistance       Functional mobility during ADLs: +2 for physical assistance;Maximal assistance;Cueing for sequencing;Cueing for safety       Vision Baseline Vision/History: Wears glasses Wears Glasses: Reading only Vision Assessment?: Vision impaired- to be further tested in functional context Additional Comments: Appeas to have dysconjugate gaze; difficult to assess due to cognitive status; eyes closed majority of session; ? nystagmus     Perception Perception Comments: perception inmpaired. will further assess   Praxis Praxis Praxis tested?: Deficits Deficits: Limb apraxia;Organization    Pertinent Vitals/Pain Pain Assessment: Faces Faces Pain Scale: No hurt     Hand Dominance Right   Extremity/Trunk Assessment Upper Extremity Assessment Upper Extremity Assessment: RUE deficits/detail;LUE deficits/detail RUE Deficits / Details: apparent shoulder dysfunctiona PTA; apparent sensory motor deficits; difficulty using functionally due to motor planning difficulties; poor poroprioception of RUE; attempting to use RUE Sensation: decreased light touch;decreased proprioception RUE Coordination: decreased fine motor;decreased gross motor LUE Deficits / Details: generalized weakness LUE Coordination: decreased fine motor;decreased gross motor   Lower Extremity Assessment Lower Extremity Assessment: Defer to PT  evaluation   Cervical / Trunk Assessment Cervical / Trunk Assessment: Other exceptions(R a nd posterior bias) Cervical / Trunk  Exceptions: poor awareness of midline   Communication Communication Communication: Expressive difficulties   Cognition Arousal/Alertness: Lethargic Behavior During Therapy: Impulsive Overall Cognitive Status: Impaired/Different from baseline Area of Impairment: Attention;Following commands;Safety/judgement;Awareness;Problem solving                   Current Attention Level: Sustained   Following Commands: Follows one step commands inconsistently Safety/Judgement: Decreased awareness of safety;Decreased awareness of deficits Awareness: Emergent Problem Solving: Slow processing;Difficulty sequencing;Requires verbal cues;Requires tactile cues General Comments: Max +2 to tranfer pt and pt states he thinks he could get back to bed after awhile on his own   General Comments  Family present during assessment    Exercises     Shoulder Instructions      Home Living Family/patient expects to be discharged to:: Skilled nursing facility Living Arrangements: Spouse/significant other;Other (Comment)(wife) Available Help at Discharge: Family;Available 24 hours/day(wife can supervise) Type of Home: Independent living facility Home Access: Level entry     Home Layout: One level     Bathroom Shower/Tub: Occupational psychologist: Handicapped height     Home Equipment: Grab bars - tub/shower;Shower seat - built in          Prior Functioning/Environment Level of Independence: Independent        Comments: caregiver for sife; involved on committees; drove; cooked; enjoyed singing        OT Problem List: Decreased strength;Decreased range of motion;Decreased activity tolerance;Impaired balance (sitting and/or standing);Impaired vision/perception;Decreased coordination;Decreased cognition;Decreased safety awareness;Decreased knowledge of use of DME or AE;Impaired sensation;Impaired UE functional use      OT Treatment/Interventions: Self-care/ADL  training;Neuromuscular education;DME and/or AE instruction;Therapeutic activities;Cognitive remediation/compensation;Visual/perceptual remediation/compensation;Patient/family education;Balance training    OT Goals(Current goals can be found in the care plan section) Acute Rehab OT Goals Patient Stated Goal: to get better OT Goal Formulation: With patient/family Time For Goal Achievement: 04/17/18 Potential to Achieve Goals: Good  OT Frequency: Min 2X/week   Barriers to D/C:            Co-evaluation PT/OT/SLP Co-Evaluation/Treatment: Yes Reason for Co-Treatment: Complexity of the patient's impairments (multi-system involvement);To address functional/ADL transfers;For patient/therapist safety   OT goals addressed during session: ADL's and self-care      AM-PAC PT "6 Clicks" Daily Activity     Outcome Measure Help from another person eating meals?: Total Help from another person taking care of personal grooming?: A Lot Help from another person toileting, which includes using toliet, bedpan, or urinal?: A Lot Help from another person bathing (including washing, rinsing, drying)?: A Lot Help from another person to put on and taking off regular upper body clothing?: A Lot Help from another person to put on and taking off regular lower body clothing?: A Lot 6 Click Score: 11   End of Session Equipment Utilized During Treatment: Gait belt Nurse Communication: Mobility status;Need for lift equipment(use [employer])  Activity Tolerance: Patient tolerated treatment well Patient left: in chair;with call bell/phone within reach;with chair alarm set;with restraints reapplied;with family/visitor present  OT Visit Diagnosis: Other abnormalities of gait and mobility (R26.89);Muscle weakness (generalized) (M62.81);Ataxia, unspecified (R27.0);Apraxia (R48.2);Other symptoms and signs involving cognitive function                Time: 0626-9485 OT Time Calculation (min): 33 min Charges:  OT General  Charges $OT Visit: 1 Visit OT Evaluation $OT Eval Moderate Complexity: 1  [place], OT/L   Acute OT Clinical Specialist Acute Rehabilitation Services Pager [phone] Office [phone]   [facility] 04/03/2018, 1:14 PM

## 2018-04-03 NOTE — Progress Notes (Signed)
SLP Cancellation Note  Patient Details Name: BOBY EYER MRN: [id number] DOB: 1921-11-20   Cancelled treatment:       Reason Eval/Treat Not Completed: Other (comment)(note pt agitation overnight, currently in mitts - trying to remove - will continue efforts)  Luanna Salk, [facility] SLP [place] Pager [phone] Office [phone]   Macario Golds 04/03/2018, 9:15 AM

## 2018-04-03 NOTE — Progress Notes (Signed)
Pt having increased restlessness throughout the day and even more during the night.  Pt A&Ox4, mNIH unchanged.  Pt currently in restraints.  Dr. Lorraine Lax, neurology on call, contacted to discuss additional interventions.  No new orders received.  Will continue to monitor.

## 2018-04-03 NOTE — Progress Notes (Addendum)
  Speech Language Pathology Treatment: Dysphagia  Patient Details Name: Nathan Cook MRN: [id number] DOB: 11-Mar-1922 Today's Date: 04/03/2018 Time: 6389-3734 SLP Time Calculation (min) (ACUTE ONLY): 14 min  Assessment / Plan / Recommendation Clinical Impression  Pt with subjectively improved vocal strength to this SlP (MD Erlinda Hong confirms) however he continues with difficulty managing secretions.  SLP orally suctioned copious amounts of white tinged viscous secretions from oral cavity.   Pt also with clinical indications of airway compromise with po characterized by wet voice and throat clearing/cough with tsps water.  Note pt was an audible aspirator on MBS.    Fortunately pt able to cough or throat clear and expectorate during entire session - boding well for airway protection when transition to po.  He admits to premorbid "post nasal drip" causing him to cough and expectorate secretions.  Suspect secretion issue is multifactorial- due to dysphagia and post-nasal drip.     Pt's speech intelligibility improved today with max verbal cues to slow rate of speech.    Hopeful for repeat MBS next date with continued improve medical status/spontaneous recovery from CVA.  RN and pt informed of recommendations.    Pt educated to findings and agreeable to plan.  He does have some confusion today and reported desire for mitts to removed stating "They don't fit me correctly" despite being educated to need for safety.   HPI HPI: 84 y.o. male with PMH of HTN, HLD, DM, PVD presented with acute onset difficulty speech and swallowing, left-sided ataxia.  CT head showed stable atrophy and white matter disease, likely within normal limits for age. MRI acute left medullary infarct      SLP Plan  Continue with current plan of care;Other (Comment)(hopefully plan for repeat MBS tomorrow if continues with clinical improvement)       Recommendations  Diet recommendations: NPO single ice chips after oral care with  full supervision                Oral Care Recommendations: Oral care QID;Oral care prior to ice chip/H20 Follow up Recommendations: (tba) SLP Visit Diagnosis: Cognitive communication deficit (R41.841);Dysphagia, oropharyngeal phase (R13.12) Plan: Continue with current plan of care;Other (Comment)(hopefully plan for repeat MBS tomorrow if continues with clinical improvement)       GO                Macario Golds 04/03/2018, 9:51 AM  Luanna Salk, [place] SLP Acute Rehab Services Pager [phone] Office [phone]

## 2018-04-04 ENCOUNTER — Inpatient Hospital Stay (HOSPITAL_COMMUNITY): Payer: Medicare Other

## 2018-04-04 DIAGNOSIS — R1312 Dysphagia, oropharyngeal phase: Secondary | ICD-10-CM

## 2018-04-04 DIAGNOSIS — G8929 Other chronic pain: Secondary | ICD-10-CM

## 2018-04-04 LAB — BASIC METABOLIC PANEL
Anion gap: 15 (ref 5–15)
BUN: 28 mg/dL — ABNORMAL HIGH (ref 8–23)
CHLORIDE: 105 mmol/L (ref 98–111)
CO2: 18 mmol/L — ABNORMAL LOW (ref 22–32)
Calcium: 8.4 mg/dL — ABNORMAL LOW (ref 8.9–10.3)
Creatinine, Ser: 1.3 mg/dL — ABNORMAL HIGH (ref 0.61–1.24)
GFR calc Af Amer: 52 mL/min — ABNORMAL LOW (ref >60)
GFR calc non Af Amer: 45 mL/min — ABNORMAL LOW (ref >60)
GLUCOSE: 156 mg/dL — AB (ref 70–99)
Potassium: 3.8 mmol/L (ref 3.5–5.1)
Sodium: 138 mmol/L (ref 135–145)

## 2018-04-04 LAB — CBC
HEMATOCRIT: 40 % (ref 39.0–52.0)
HEMOGLOBIN: 13.3 g/dL (ref 13.0–17.0)
MCH: 33.8 pg (ref 26.0–34.0)
MCHC: 33.3 g/dL (ref 30.0–36.0)
MCV: 101.8 fL — AB (ref 80.0–100.0)
Platelets: 338 10*3/uL (ref 150–400)
RBC: 3.93 MIL/uL — AB (ref 4.22–5.81)
RDW: 13.2 % (ref 11.5–15.5)
WBC: 15.2 10*3/uL — ABNORMAL HIGH (ref 4.0–10.5)
nRBC: 0 % (ref 0.0–0.2)

## 2018-04-04 LAB — GLUCOSE, CAPILLARY
GLUCOSE-CAPILLARY: 160 mg/dL — AB (ref 70–99)
GLUCOSE-CAPILLARY: 125 mg/dL — AB (ref 70–99)
Glucose-Capillary: 144 mg/dL — ABNORMAL HIGH (ref 70–99)
Glucose-Capillary: 155 mg/dL — ABNORMAL HIGH (ref 70–99)
Glucose-Capillary: 118 mg/dL — ABNORMAL HIGH (ref 70–99)
Glucose-Capillary: 134 mg/dL — ABNORMAL HIGH (ref 70–99)

## 2018-04-04 NOTE — Progress Notes (Addendum)
Physical Therapy Treatment Patient Details Name: Nathan Cook MRN: [id number] DOB: 1921-10-20 Today's Date: 04/04/2018    History of Present Illness Pt is a 84 y.o. male with PMH of HTN, HLD, DM, PVD presented with acute onset difficulty speech and swallowing, left-sided ataxia.  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  PMH: DM; HTN; SAH (2009); TIA.     PT Comments    Pt making fair progress with functional mobility. He continues to require heavy physical assistance for bed mobility and transfers but was able to progress to sitting EOB with min guard for safety. He continues to demonstrate poor coordination and motor planning with decreased awareness of R UE. Pt would continue to benefit from skilled physical therapy services at this time while admitted and after d/c to address the below listed limitations in order to improve overall safety and independence with functional mobility.    Follow Up Recommendations  SNF;Supervision/Assistance - 24 hour;Other (comment)(family's preference is SNF at [facility])     Equipment Recommendations  Wheelchair (measurements PT);Wheelchair cushion (measurements PT);Rolling walker with 5" wheels;3in1 (PT)    Recommendations for Other Services       Precautions / Restrictions Precautions Precautions: Fall Restrictions Weight Bearing Restrictions: No    Mobility  Bed Mobility Overal bed mobility: Needs Assistance Bed Mobility: Supine to Sit     Supine to sit: Max assist     General bed mobility comments: increased time and effort, verbal and tactile cueing for sequencing, assist with LEs off of bed and for trunk elevation; use of bed pads to position pt's hips at EOB  Transfers Overall transfer level: Needs assistance Equipment used: Rolling walker (2 wheeled) Transfers: Sit to/from [facility] Sit to Stand: Mod assist;+2 physical assistance;From elevated surface Stand pivot transfers: Mod  assist;+2 physical assistance;+2 safety/equipment       General transfer comment: assist needed to power into standing and for stability with transitional movements; pt again having difficulty with motor planning to complete task  Ambulation/Gait                 Stairs             Wheelchair Mobility    Modified Rankin (Stroke Patients Only) Modified Rankin (Stroke Patients Only) Pre-Morbid Rankin Score: No symptoms Modified Rankin: Severe disability     Balance Overall balance assessment: Needs assistance Sitting-balance support: Feet supported Sitting balance-Leahy Scale: Fair Sitting balance - Comments: pt able to sit EOB with min guard for safety   Standing balance support: Bilateral upper extremity supported Standing balance-Leahy Scale: Poor Standing balance comment: mod A x2 and bilateral UEs on RW                            Cognition Arousal/Alertness: Awake/alert Behavior During Therapy: Impulsive Overall Cognitive Status: Impaired/Different from baseline Area of Impairment: Attention;Following commands;Safety/judgement;Problem solving                   Current Attention Level: Sustained   Following Commands: Follows one step commands inconsistently;Follows one step commands with increased time Safety/Judgement: Decreased awareness of deficits;Decreased awareness of safety Awareness: Emergent Problem Solving: Slow processing;Difficulty sequencing;Requires verbal cues;Requires tactile cues        Exercises      General Comments        Pertinent Vitals/Pain Pain Assessment: No/denies pain    Home Living  Prior Function            PT Goals (current goals can now be found in the care plan section) Acute Rehab PT Goals PT Goal Formulation: With patient/family Time For Goal Achievement: 04/17/18 Potential to Achieve Goals: Good Progress towards PT goals: Progressing toward goals     Frequency    Min 3X/week      PT Plan Current plan remains appropriate    Co-evaluation              AM-PAC PT "6 Clicks" Daily Activity  Outcome Measure  Difficulty turning over in bed (including adjusting bedclothes, sheets and blankets)?: Unable Difficulty moving from lying on back to sitting on the side of the bed? : Unable Difficulty sitting down on and standing up from a chair with arms (e.g., wheelchair, bedside commode, etc,.)?: Unable Help needed moving to and from a bed to chair (including a wheelchair)?: A Lot Help needed walking in hospital room?: Total Help needed climbing 3-5 steps with a railing? : Total 6 Click Score: 7    End of Session Equipment Utilized During Treatment: Gait belt Activity Tolerance: Patient tolerated treatment well Patient left: in chair;with call bell/phone within reach;with chair alarm set Nurse Communication: Mobility status PT Visit Diagnosis: Muscle weakness (generalized) (M62.81);Difficulty in walking, not elsewhere classified (R26.2);Hemiplegia and hemiparesis Hemiplegia - Right/Left: Right Hemiplegia - dominant/non-dominant: Dominant Hemiplegia - caused by: Cerebral infarction;Nontraumatic SAH     Time: 4193-7902 PT Time Calculation (min) (ACUTE ONLY): 21 min  Charges:  $Therapeutic Activity: 8-22 mins                     Sherie Don, PT, DPT  Acute Rehabilitation Services Pager [phone] Office [place] 04/04/2018, 3:35 PM

## 2018-04-04 NOTE — Plan of Care (Signed)
Patient stable, discussed POC with patient and family, agreeable with plan. Family assisting with speech therapy exercises at bedside, denies question/concerns at this time.

## 2018-04-04 NOTE — Progress Notes (Addendum)
STROKE TEAM PROGRESS NOTE   SUBJECTIVE (INTERVAL HISTORY) NT at bedside, just finished bathing pt. He states he is stable, unchanged from yesterday. When discussed failed swallow, he stated he did not want a tube if he could not swallow. Will have SLP followup and readdress as needed. No family present.   OBJECTIVE Temp:  [97.6 F (36.4 C)-98.2 F (36.8 C)] 98 F (36.7 C) (10/10 0749) Pulse Rate:  [67-155] 155 (10/10 0749) Cardiac Rhythm: Heart block (10/10 0700) Resp:  [17-29] 20 (10/10 0749) BP: (139-164)/(74-99) 139/99 (10/10 0749) SpO2:  [92 %-95 %] 95 % (10/10 0749)  Recent Labs  Lab 04/03/18 1526 04/03/18 2014 04/04/18 0008 04/04/18 0359 04/04/18 0747  GLUCAP 131* 129* 144* 155* 160*   Recent Labs  Lab 04/01/18 1432 04/01/18 1438 04/02/18 0259 04/03/18 0304 04/04/18 0426  NA 135 137 135 136 138  K 4.2 4.2 4.0 3.7 3.8  CL 103 104 103 105 105  CO2 22  --  22 22 18*  GLUCOSE 161* 162* 205* 148* 156*  BUN 19 20 24* 28* 28*  CREATININE 1.51* 1.50* 1.34* 1.36* 1.30*  CALCIUM 9.4  --  8.8* 8.6* 8.4*   Recent Labs  Lab 04/01/18 1432  AST 25  ALT 19  ALKPHOS 57  BILITOT 0.9  PROT 6.6  ALBUMIN 3.7   Recent Labs  Lab 04/01/18 1432 04/01/18 1438 04/02/18 0259 04/03/18 0304 04/04/18 0426  WBC 8.9  --  8.2 24.0* 15.2*  NEUTROABS 6.3  --   --   --   --   HGB 12.7* 12.2* 11.8* 12.0* 13.3  HCT 38.4* 36.0* 35.3* 36.2* 40.0  MCV 104.9*  --  101.1* 100.3* 101.8*  PLT 317  --  287 365 338   No results for input(s): CKTOTAL, CKMB, CKMBINDEX, TROPONINI in the last 168 hours. Recent Labs    04/01/18 1432  LABPROT 13.7  INR 1.06   Recent Labs    04/03/18 0928  COLORURINE YELLOW  LABSPEC 1.027  PHURINE 5.0  GLUCOSEU 50*  HGBUR MODERATE*  BILIRUBINUR NEGATIVE  KETONESUR 20*  PROTEINUR 30*  NITRITE NEGATIVE  LEUKOCYTESUR NEGATIVE     IMAGING Ct Head Wo Contrast  Result Date: 04/04/2018 CLINICAL DATA:  Subarachnoid hemorrhage EXAM: CT HEAD WITHOUT  CONTRAST TECHNIQUE: Contiguous axial images were obtained from the base of the skull through the vertex without intravenous contrast. COMPARISON:  Head CT 04/01/2018, 04/02/2018 Brain MRI 04/02/2018 FINDINGS: Brain: Small volume left temporal subarachnoid hemorrhage has increased slightly from the prior study. The medial aspect is slightly more rounded in contour and could be intraparenchymal petechial hemorrhage. No contralateral hemorrhage or discrete new site of hemorrhage. There is no midline shift or mass effect. There is periventricular hypoattenuation compatible with chronic microvascular disease. Vascular: There is atherosclerotic calcification of the internal carotid arteries at the skull base. Skull: Normal Sinuses/Orbits: Paranasal sinuses are clear.  The orbits are normal. Other: None. IMPRESSION: Slightly increased volume of left temporal lobe subarachnoid hemorrhage, remote from the site of ischemia demonstrated on the MRI of 04/02/2018. There may be a small amount of superimposed intraparenchymal petechial hemorrhage. Heidelberg classification 3c: Subarachnoid hemorrhage, remote from area of infarcted brain tissue. Electronically Signed   By: Ulyses Jarred M.D.   On: 04/04/2018 06:59   Dg Chest Port 1 View  Result Date: 04/03/2018 CLINICAL DATA:  Congestion, weakness. EXAM: PORTABLE CHEST 1 VIEW COMPARISON:  None. FINDINGS: The heart size and mediastinal contours are within normal limits. Atherosclerosis of thoracic aorta is  noted. No pneumothorax or pleural effusion is noted. Right lung is clear. Minimal left basilar subsegmental atelectasis is noted. The visualized skeletal structures are unremarkable. IMPRESSION: Minimal left basilar subsegmental atelectasis. Aortic Atherosclerosis (ICD10-I70.0). Electronically Signed   By: Marijo Conception, M.D.   On: 04/03/2018 12:34      PHYSICAL EXAM General - Well nourished, well developed, no acute distress  Ophthalmologic - fundi not visualized due  to noncooperation.  Cardiovascular - Regular rate and rhythm.  Mental Status -  Level of arousal and orientation to year, self, age and person were intact, but not orientated to place Language including expression, naming, repetition, comprehension was assessed and found intact. Hypophonia and hoarseness are improving.   Cranial Nerves II - XII - II - Visual field intact OU. III, IV, VI - Extraocular movements intact. V - Facial sensation intact bilaterally. VII - Facial movement intact bilaterally. VIII - Hearing & vestibular intact bilaterally, few beats horizontal nystagmus on left gaze, direction to the left. X - Palate elevates symmetrically, improved hypophonia and hoarseness. XI - Chin turning & shoulder shrug intact bilaterally. XII - Tongue protrusion intact.  Motor Strength - The patient's strength was normal in all extremities except right UE and LE drift.  Bulk was normal and fasciculations were absent.   Motor Tone - Muscle tone was assessed at the neck and appendages and was normal.  Reflexes - The patient's reflexes were symmetrical in all extremities and he had no pathological reflexes.  Sensory - Light touch, temperature/pinprick were assessed and were symmetrical.    Coordination - The patient had dysmetria on the left hand FTN proportional to the weakness.  Tremor was absent.  Gait and Station - deferred.   ASSESSMENT/PLAN Nathan Cook is a 84 y.o. male with history of HTN, HLD, DM, PVD admitted for acute onset difficulty speech and swallowing, left-sided ataxia.  TPA given.    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   Ongoing aggressive stroke risk factor management  Therapy recommendations:  SNF, 3N1  Disposition:  Pending - from [facility] independent living. They will have SNF bed for him at d/c.  Left posterior temporal small SAH, asymptomatic  With retrospective review of CT head, it was present on the initial CT head  No change of small size on repeat CT and MRI  Seems not changed after tPA  Hold off antiplatelet at this time  repeat CT read as slight increase L temporal lobe SAH volume, however appears unchanged from MRI   Leukocytosis  WBC 8.2->24.0->15.2  Etiology unclear, aspiration ??   afebrile  UA no UTI  Urine culture pending  CXR minimal L basilar atx  Delirium   Improved but still confused   Due to ICU and hospitalization most likely  Off restraints   Consider seroquel when po access if needed  repeat CT read as slight increase L temporal lobe SAH volume, however appears unchanged from MRI  Diabetes  HgbA1c 7.6 goal < 7.0  Uncontrolled  Mild hyperglycemia, improving  CBG monitoring  SSI  close PCP follow up  Hypertension . Stable  BP goal < 160  Hyperlipidemia  Home meds:  zocor 10   LDL 80, goal < 70  Will increase to zocor 20 once po access  Continue statin at discharge  Dysphagia  Symptoms of swallow difficulty and hoarseness  Did not pass swallow  Continue NPO   Speech continues to follow - considering MBSS  On IVF to 65cc/h  Pt states he would not want a tube. May need to discuss further  Other Stroke Risk Factors  Advanced age  PVD  Other Active Problems  CKD III - Cre 1.51->1.34->1.36-<1.30 - on IVF  Hospital day # [place], MSN, APRN, ANVP-BC, AGPCNP-BC Advanced Practice Stroke Nurse [place] for Schedule & Pager information 04/04/2018 11:37 AM   ATTENDING NOTE: I reviewed above note and agree with the assessment and plan. Pt was seen and examined.   Pt daughter and wife are at the bedside.  Patient sitting in bed, still need frequent suctioning.  Did not pass swallow, and currently n.p.o.  Refused NG tube at this time.  However, if not able to pass swallow, will consider NG tube tomorrow.  PT/OT recommend SNF, if patient swallow function not improving, he needs PEG tube for SNF placement. Leukocytosis and AKI improving, hyperglycemia much improved. CT head repeat showed slightly more SAH at left temporoparietal, however no significant change from MRI.  Patient seemed asymptomatic at this time. Will repeat CT in 2 days.   Rosalin Hawking, MD PhD Stroke Neurology 04/04/2018 4:26 PM     To contact Stroke Continuity provider, please refer to [email or web address] After hours, contact General Neurology

## 2018-04-04 NOTE — Care Management Important Message (Signed)
Important Message  Patient Details  Name: Nathan Cook MRN: [id number] Date of Birth: [birth year]   Medicare Important Message Given:  Yes    Orbie Pyo 04/04/2018, 4:36 PM

## 2018-04-04 NOTE — Progress Notes (Addendum)
  Speech Language Pathology Treatment: Dysphagia;Cognitive-Linquistic  Patient Details Name: Nathan Cook MRN: [id number] DOB: Jan 05, 1922 Today's Date: 04/04/2018 Time: 0820-0850 SLP Time Calculation (min) (ACUTE ONLY): 30 min  Assessment / Plan / Recommendation Clinical Impression  Subjectively pt continues to demonstrates signs of decreased airway protection with secretions and PO trials, but cognition is improving. Phonation present cough strength quite good though dysmetria present in all efforts. Pt is oriented and able to follow complex commands for pharyngeal strengthening exercises. During PO trials pt attempts at effortful swallows did not appear particularly effective as duration of laryngeal elevation subjectively quite brief, multiple swallows for each bolus did not appear to clear pharyngeal residue, pt hocked a good portion and suctioned orally. He did complete 30 reps of CTAR (chin tuck against resistance) and 30 reps with respironics EMST device set to 20 cm H20. Pt verbalized and demonstrated understanding of exercise plan.  Given improvement in participation and possibly strength, will plan for repeat MBS before the weekend to determine if any improvement in function could allow functional PO intake.    HPI HPI: 84 y.o. male with PMH of HTN, HLD, DM, PVD presented with acute onset difficulty speech and swallowing, left-sided ataxia.  CT head showed stable atrophy and white matter disease, likely within normal limits for age. MRI acute left medullary infarct      SLP Plan  Continue with current plan of care;Other (Comment)       Recommendations  Diet recommendations: NPO;Other(comment)(ice chips after oral care) Medication Administration: Via alternative means                Plan: Continue with current plan of care;Other (Comment)       GO               Herbie Baltimore, MA CCC-SLP  Acute Rehabilitation Services Pager [phone] Office  [phone]  Lynann Beaver 04/04/2018, 11:01 AM

## 2018-04-05 ENCOUNTER — Inpatient Hospital Stay (HOSPITAL_COMMUNITY): Payer: Medicare Other

## 2018-04-05 DIAGNOSIS — R471 Dysarthria and anarthria: Secondary | ICD-10-CM

## 2018-04-05 LAB — GLUCOSE, CAPILLARY
GLUCOSE-CAPILLARY: 102 mg/dL — AB (ref 70–99)
GLUCOSE-CAPILLARY: 210 mg/dL — AB (ref 70–99)
Glucose-Capillary: 109 mg/dL — ABNORMAL HIGH (ref 70–99)
Glucose-Capillary: 122 mg/dL — ABNORMAL HIGH (ref 70–99)
Glucose-Capillary: 135 mg/dL — ABNORMAL HIGH (ref 70–99)
Glucose-Capillary: 132 mg/dL — ABNORMAL HIGH (ref 70–99)

## 2018-04-05 NOTE — Progress Notes (Addendum)
STROKE TEAM PROGRESS NOTE   SUBJECTIVE (INTERVAL HISTORY) Lying in the bed. No new complaints. Remains dysarthric. RN documented AF/Aflutter during the night. No hx of AF. Does have self reported hx arrhythmia. Will have cardiology evaluate (abnormalities started at 0409).  OBJECTIVE Temp:  [97.5 F (36.4 C)-98.3 F (36.8 C)] 97.5 F (36.4 C) (10/11 1200) Pulse Rate:  [67-96] 92 (10/11 1200) Cardiac Rhythm: Heart block (10/11 0800) Resp:  [16-18] 18 (10/11 1200) BP: (133-156)/(74-112) 155/82 (10/11 1200) SpO2:  [94 %-98 %] 98 % (10/11 1200)  Recent Labs  Lab 04/04/18 2029 04/05/18 0018 04/05/18 0355 04/05/18 0754 04/05/18 1152  GLUCAP 134* 109* 122* 102* 135*   Recent Labs  Lab 04/01/18 1432 04/01/18 1438 04/02/18 0259 04/03/18 0304 04/04/18 0426  NA 135 137 135 136 138  K 4.2 4.2 4.0 3.7 3.8  CL 103 104 103 105 105  CO2 22  --  22 22 18*  GLUCOSE 161* 162* 205* 148* 156*  BUN 19 20 24* 28* 28*  CREATININE 1.51* 1.50* 1.34* 1.36* 1.30*  CALCIUM 9.4  --  8.8* 8.6* 8.4*   Recent Labs  Lab 04/01/18 1432  AST 25  ALT 19  ALKPHOS 57  BILITOT 0.9  PROT 6.6  ALBUMIN 3.7   Recent Labs  Lab 04/01/18 1432 04/01/18 1438 04/02/18 0259 04/03/18 0304 04/04/18 0426  WBC 8.9  --  8.2 24.0* 15.2*  NEUTROABS 6.3  --   --   --   --   HGB 12.7* 12.2* 11.8* 12.0* 13.3  HCT 38.4* 36.0* 35.3* 36.2* 40.0  MCV 104.9*  --  101.1* 100.3* 101.8*  PLT 317  --  287 365 338   No results for input(s): CKTOTAL, CKMB, CKMBINDEX, TROPONINI in the last 168 hours. No results for input(s): LABPROT, INR in the last 72 hours. Recent Labs    04/03/18 0928  COLORURINE YELLOW  LABSPEC 1.027  PHURINE 5.0  GLUCOSEU 50*  HGBUR MODERATE*  BILIRUBINUR NEGATIVE  KETONESUR 20*  PROTEINUR 30*  NITRITE NEGATIVE  LEUKOCYTESUR NEGATIVE     IMAGING No results found.    PHYSICAL EXAM General - Well nourished, well developed, no acute distress Cardiovascular - Regular rate and  rhythm.  Mental Status -  Level of arousal and orientation to month, year, upcoming holiday, self, age and person were intact. Language including expression, naming, repetition, comprehension was assessed and found intact. Dysarthric. Hoarse.   Cranial Nerves II - XII - II - Visual field intact OU. III, IV, VI - Extraocular movements intact. V - Facial sensation intact bilaterally. VII - Facial movement intact bilaterally. VIII - Hearing & vestibular intact bilaterally, few beats horizontal nystagmus on left gaze, direction to the left. X - Palate elevates symmetrically, improved hypophonia and hoarseness. XI - Chin turning & shoulder shrug intact bilaterally. XII - Tongue protrusion intact.  Motor Strength - The patient's strength was normal in all extremities right UE drift (rotator cuff injury) and improved LE drift.  Bulk was normal and fasciculations were absent.   Motor Tone - Muscle tone was assessed at the neck and appendages and was normal.  Reflexes - The patient's reflexes were symmetrical in all extremities and he had no pathological reflexes.  Sensory - Light touch, temperature/pinprick were assessed and were symmetrical.    Coordination - The patient had dysmetria on the left hand FTN proportional to the weakness.  Tremor was absent.  Gait and Station - deferred.   ASSESSMENT/PLAN Mr. VALIN MASSIE is a  84 y.o. male with history of HTN, HLD, DM, PVD admitted for acute onset difficulty speech and swallowing, left-sided ataxia.  TPA given.    Stroke: left central medullary infarct s/p IV tPA, likely due to left VA occlusion   Resultant nystagmus, ataxia, hoarseness, swallow difficulty  MRI left central medullary infarct, left posterior temporal small SAH  CTA head and neck - left VA occlusion at V4 segment and left PICA occlusion  repeat CT read as slight increase L temporal lobe SAH volume, however appears unchanged from MRI  2D Echo EF 60-65%  LDL  80  HgbA1c 7.6  Heparin subq for VTE prophylaxis  NPO - SLP fp;;pwomg  aspirin 325 mg daily prior to admission, now on No antithrombotic given left posterior temporal small SAH.   Ongoing aggressive stroke risk factor management  Therapy recommendations:  SNF, 3N1  Disposition:  Pending - from [facility] independent living. They will have SNF bed for him at d/c.  Left posterior temporal small SAH, asymptomatic  With retrospective review of CT head, it was present on the initial CT head  No change of small size on repeat CT and MRI  Seems not changed after tPA  Hold off antiplatelet at this time  repeat CT read as slight increase L temporal lobe SAH volume, however appears unchanged from MRI   Repeat CT head 10/12  Arrhythmia  Possible AF/A Flutter starting around 0409 last night through 0700  Hx self-reported arrhythmia, no known hx AF  Ask cardiology to review  Leukocytosis  WBC 8.2->24.0->15.2  Etiology unclear, aspiration ??   afebrile  UA no UTI  Urine culture pending  CXR minimal L basilar atx  Repeat labs in am  Delirium, resolved  Due to ICU and hospitalization most likely  Off restraints   Consider seroquel when po access if needed  Diabetes  HgbA1c 7.6 goal < 7.0  Uncontrolled  Mild hyperglycemia, improving  CBG monitoring  SSI  close PCP follow up  Hypertension . Stable  BP goal < 160  Hyperlipidemia  Home meds:  zocor 10   LDL 80, goal < 70  Will increase to zocor 20 once po access  Continue statin at discharge  Dysphagia  Symptoms of swallow difficulty and hoarseness  Did not pass swallow  Continue NPO   Speech continues to follow - considering MBSS  On IVF to 65cc/h  Pt initially stated he would not want a tube - at second discussion, may consider a temporary tube. Will discuss further as needed  Other Stroke Risk Factors  Advanced age  PVD  Other Active Problems  CKD III - Cre  1.51->1.34->1.36-<1.30 - on IVF  Hospital day # [place], MSN, APRN, ANVP-BC, AGPCNP-BC Advanced Practice Stroke Nurse [place] for Schedule & Pager information 04/05/2018 1:09 PM   ATTENDING NOTE: I reviewed above note and agree with the assessment and plan. Pt was seen and examined.   No family is at the bedside. Neuro stable, no acute event overnight. Still has right arm weakness and left gaze nystagmus. Pt had repeat BMS and much improved and currently recommend nectar thick liquid diet.  will continue to observe over the weekend. If continues to improve, may advance diet early next week. Will repeat CBC, BMP, and CT head in am.   Rosalin Hawking, MD PhD Stroke Neurology 04/05/2018 3:36 PM      To contact Stroke Continuity provider, please refer to [email or web address] After hours, contact General Neurology

## 2018-04-05 NOTE — Progress Notes (Signed)
Patient has multiple artifacts  Multifocal PVC's, A-fibs, A-flutters, and ST frequently in this shift, but is seems and verbalizes of feeling fine, asymptomatic. Informed oncall MD about the issue and will continue to monitor closely.

## 2018-04-05 NOTE — Progress Notes (Signed)
CSW following for discharge plan. CSW contacted [facility] to discuss patient status. CSW discussed barriers to discharge currently, and asked about whether anyone would be available to admit patient over the weekend if he were to become medically stable. Per Donnelly Angelica at [facility], there are too many unknowns about the patient's care at this time (his discharge meds, equipment needs, feeding status) for them to be able to get everything that he would need in house today. Patient would also need a prior authorization and that won't be able to be obtained prior to close of business today. Facility will not be able to admit the patient over the weekend.  CSW will follow back up with facility on Monday to discuss patient and work towards discharge.  Laveda Abbe, [place] Clinical Social Worker [phone]

## 2018-04-05 NOTE — Evaluation (Signed)
Modified Barium Swallow Progress Note  Patient Details  Name: Nathan Cook MRN: [id number] Date of Birth: 1922/06/07  Today's Date: 04/05/2018  Modified Barium Swallow completed.  Full report located under Chart Review in the Imaging Section.  Brief recommendations include the following:  Clinical Impression  Pt presents with notable improvement from [facility] 10/8 with continued moderate pharyngeal dysphagia today characterized by decreased contraction and duration of swallow resulting in moderate pharyngeal residue. Aspiration of thin liquids before, during and after the swallow was sensed and imaging was unclear however suspect clearance of aspirates due to strong reflexive cough. Airway protection sufficient with NTL with moderate residue in the valleculae and pyriforms that was successfully cleared followed by a STRONG throat clear and 2-3 additional dry swallows. AP view reveals mildly increased residue on the L however R head tilt and L head turn were both ineffective in facilitating clearance of residue. Improved UES opening secondary to improved laryngeal mobility from initial study resulting in decreased residue in the pyriforms and improved ability to clear residue. Continued severe residue of puree in the valleculae was unsuccessfully cleared with 3-4 additional swallows and note chin tuck and NTL wash were minimally effective in facilitating clearance of residue. Recommend NECTAR thick liquid diet with continuation of exercise plan and repeat MBS Monday 10/14 given rapid spontaneous improvement. Strategies: following sip of NTL demonstrate throat clear and 2-3 dry swallows.    Swallow Evaluation Recommendations       SLP Diet Recommendations: Nectar thick liquid   Liquid Administration via: Straw;Cup   Medication Administration: Other (Comment)(crushed in NTL)   Supervision: Patient able to self feed;Staff to assist with self feeding;Intermittent supervision to cue for  compensatory strategies   Compensations: Minimize environmental distractions;Small sips/bites;Clear throat after each swallow;Slow rate;Multiple dry swallows after each bite/sip   Postural Changes: Remain semi-upright after after feeds/meals (Comment);Seated upright at 90 degrees   Oral Care Recommendations: Oral care BID   Other Recommendations: Order thickener from [place]. Nathan Cook, CCC-SLP Speech Language Pathologist'  Wende Bushy 04/05/2018,2:57 PM

## 2018-04-05 NOTE — Consult Note (Signed)
Cardiology Consultation:   Patient ID: Nathan Cook MRN: [id number]; DOB: February 14, 1922  Admit date: 04/01/2018 Date of Consult: 04/05/2018  Primary Care Provider: Blanchie Serve, MD Primary Cardiologist: Buford Dresser, MD - New   Patient Profile:   Nathan Cook is a 84 y.o. male with a hx of hypertension, hyperlipidemia, DM type 2, BPH who is being seen today for the evaluation of atrial fibrillation in setting of stroke at the request of Dr. Erlinda Hong.  History of Present Illness:   Nathan Cook was admitted to the hospital on 04/01/2018 for evaluation of acute onset of difficulty with speech and swallowing and left sided ataxia. 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 CTA of the head/neck showed left VA occlusion. Antithrombotic is now on hold due to [facility].   Echocardiogram showed normal LV EF 60-65%, Moderate AS and mild AI.   He developed intermittent atrial fibrillation during last night. There may be a run of atrial flutter on tele yesterday.   He still has some weakness of the right upper extremity and is a little hard to understand his speech. His mouth is very dry. He is very alert and oriented. He denies any prior cardiac history except for a murmur present since the 1960's. He denies any prior afib, arrhythmias or irregular heart beats. He has not noted any palpitations since his afib has been noted on the monitor.   He lives at [facility] with his wife and takes care of himself. He usually walks a lot and goes to the gym at [employer] [facility] on most days. He rides the stationary bike for several miles. He has had not chest pain/pressure/tightness. He has some mild DOE that he feels is appropriate for his age. He is able to climb one flight of stairs easily.   He reports that he has a partial blockage in his leg and  has appt with vascular surgeon on 11/5 to evaluate.   Past Medical History:  Diagnosis Date  . BPH (benign prostatic hyperplasia) 10/14/2014  . Cervicalgia 01/04/2016  . Contusion of left shoulder 05/27/14  . DM type 2 (diabetes mellitus, type 2) ([place])   . High blood pressure   . History of basal cell cancer 11/21/2005   Right temple  . History of colonic polyps 10/14/2003  . Hyperlipidemia   . Left knee pain 05/27/14  . SAH (subarachnoid hemorrhage) ([place]) 2009   TBI  . TIA (transient ischemic attack) 11/06/2005   Left eye pain. Slurred speech. Diaphoresis. No residual effects.     Past Surgical History:  Procedure Laterality Date  . Achilles tendon repair Left 1998   ruptured tendon  . COLONOSCOPY  1992   Polyp removed  . COLONOSCOPY  1998   normal  . ORIF FEMUR FRACTURE Right 1929  . TONSILLECTOMY       Home Medications:  Prior to Admission medications   Medication Sig Start Date End Date Taking? Authorizing Provider  acetaminophen (TYLENOL 8 HOUR) 650 MG CR tablet Take 1 tablet (650 mg total) by mouth every 8 (eight) hours as needed for pain. 09/19/17  Yes Blanchie Serve, MD  aspirin 325 MG EC tablet Take 1 tablet (325 mg total) by mouth daily. 03/20/18  Yes Blanchie Serve, MD  Calcium Carb-Cholecalciferol (CALCIUM 600+D3 PO) Take 1 tablet by mouth daily.   Yes [provider]  Carboxymethylcellulose Sodium (EYE DROPS OP) Place 2 drops into both eyes as needed (FOR DRY EYES).    Yes [provider]  losartan (COZAAR) 50 MG tablet Take 1 tablet (50 mg total) by mouth daily. 08/10/17  Yes Blanchie Serve, MD  metFORMIN (GLUCOPHAGE) 1000 MG tablet TAKE ONE TABLET BY MOUTH EVERY MORNING TO CONTROL DIABETES Patient taking differently: Take 1,000 mg by mouth daily with breakfast.  03/26/18  Yes Ngetich, Dinah C, NP  simvastatin (ZOCOR) 10 MG tablet Take 1 tablet (10 mg total) by mouth at bedtime. 03/21/17  Yes Blanchie Serve, MD  Blood Glucose Monitoring Suppl (ONE TOUCH  ULTRA SYSTEM KIT) w/Device KIT Use to test blood sugar twice daily. Dx E11.9 06/14/16   Estill Dooms, MD  Cholecalciferol 1000 UNITS tablet Take 1,000 Units by mouth daily.    [provider]  glucose blood test strip One Touch Ultra Test Strip Sig:Use to test blood sugar twice daily. Dx E11.9 07/28/16   Estill Dooms, MD  Lancets [facility] LANCET) MISC Use to test blood sugar twice daily. Dx: E11.9 09/13/16   Estill Dooms, MD    Inpatient Medications: Scheduled Meds: . chlorhexidine  15 mL Mouth Rinse BID  . heparin injection (subcutaneous)  5,000 Units Subcutaneous Q8H  . insulin aspart  0-9 Units Subcutaneous Q4H  . mouth rinse  15 mL Mouth Rinse q12n4p   Continuous Infusions: . sodium chloride 65 mL/hr at 04/05/18 1528  . famotidine (PEPCID) IV 20 mg (04/05/18 0920)   PRN Meds: acetaminophen **OR** acetaminophen (TYLENOL) oral liquid 160 mg/5 mL **OR** acetaminophen, labetalol  Allergies:   No Known Allergies  Social History:   Social History   Socioeconomic History  . Marital status: Married    Spouse name: Not on file  . Number of children: Not on file  . Years of education: Not on file  . Highest education level: Not on file  Occupational History  . Occupation: retired Copy  . Financial resource strain: Not on file  . Food insecurity:    Worry: Not on file    Inability: Not on file  . Transportation needs:    Medical: Not on file    Non-medical: Not on file  Tobacco Use  . Smoking status: Former Smoker    Last attempt to quit: 06/26/1971    Years since quitting: 46.8  . Smokeless tobacco: Never Used  . Tobacco comment: 2 1/2 packs a day, until 1972  Substance and Sexual Activity  . Alcohol use: Yes    Alcohol/week: 1.0 - 2.0 standard drinks    Types: 1 - 2 Glasses of wine per week    Comment: Wine nightly  . Drug use: No  . Sexual activity: Not on file  Lifestyle  . Physical activity:    Days per week: Not on  file    Minutes per session: Not on file  . Stress: Not on file  Relationships  . Social connections:    Talks on phone: Not on file    Gets together: Not on file    Attends religious service: Not on file    Active member of club or organization: Not on file    Attends meetings of clubs or organizations: Not on file    Relationship status: Not on file  . Intimate partner violence:    Fear of current or ex partner: Not on file    Emotionally abused: Not on file  Physically abused: Not on file    Forced sexual activity: Not on file  Other Topics Concern  . Not on file  Social History Narrative   Patient lives at [facility] since Dec 2015   Caffeine- Coffee   Married- Yes, 1951   [employer] 1943-46   House- Apartment with 2 people   Pets- No   Current/past profession- Press photographer   Exercise- Yes, walking   Living will- Yes   DNR- Yes   POA/HPOA- Yes       Family History:    Family History  Problem Relation Age of Onset  . Heart attack Mother   . Heart disease Mother   . Heart disease Father      ROS:  Please see the history of present illness.   All other ROS reviewed and negative.     Physical Exam/Data:   Vitals:   04/05/18 0329 04/05/18 0752 04/05/18 1200 04/05/18 1603  BP: (!) 156/82 (!) 149/77 (!) 155/82 (!) 165/85  Pulse: 94 90 92 97  Resp: _0 Temp: (!) 97.5 F (36.4 C) 97.9 F (36.6 C) (!) 97.5 F (36.4 C) 98.3 F (36.8 C)  TempSrc: Oral  Oral Oral  SpO2: 94% 97% 98% 95%  Weight:      Height:        Intake/Output Summary (Last 24 hours) at 04/05/2018 1713 Last data filed at 04/05/2018 1200 Gross per 24 hour  Intake 50 ml  Output 400 ml  Net -350 ml   Filed Weights   04/01/18 1400 04/01/18 1630  Weight: 67.2 kg 65.5 kg   Body mass index is 21.32 kg/m.  General:  Well nourished, well developed, in no acute distress HEENT: normal Neck: no JVD Endocrine:  No thryomegaly Vascular: No carotid bruits; FA pulses 2+ bilaterally  without bruits  Cardiac:  normal S1, S2; RRR; 3/6 systolic murmur at RUSB Lungs:  clear to auscultation bilaterally, few scattered rhonchi Abd: soft, nontender, no hepatomegaly  Ext: no edema Musculoskeletal:  No deformities, BUE and BLE strength normal and equal Skin: warm and dry  Neuro:  Weakness of RUE, speech difficult to understand but appropriate responses Psych:  Normal affect   EKG:  The EKG was personally reviewed and demonstrates:  Sinus rhythm with Prolonged PR interval, Right bundle branch block Telemetry:  Telemetry was personally reviewed and demonstrates:  Sinus rhythm with intermittent atrial fibrillation   Relevant CV Studies:  Echo 04/02/2018 Study Conclusions - Left ventricle: The cavity size was normal. Wall thickness was   normal. Systolic function was normal. The estimated ejection   fraction was in the range of 60% to 65%. Wall motion was normal;   there were no regional wall motion abnormalities. Doppler   parameters are consistent with abnormal left ventricular   relaxation (grade 1 diastolic dysfunction). - Aortic valve: Valve mobility was restricted. There was moderate   stenosis. There was mild regurgitation. Valve area (VTI): 1.04   cm^2. Valve area (Vmax): 0.82 cm^2. Valve area (Vmean): 0.99   cm^2.  Impressions: - Normal LV systolic function; mild diastolic dysfunction;   calcified aortic valve with moderate AS (mean gradient 21 mmHg)   and mild AI.   Laboratory Data:  Chemistry Recent Labs  Lab 04/02/18 0259 04/03/18 0304 04/04/18 0426  NA 135 136 138  K 4.0 3.7 3.8  CL 103 105 105  CO2 22 22 18*  GLUCOSE 205* 148* 156*  BUN 24* 28* 28*  CREATININE 1.34*  1.36* 1.30*  CALCIUM 8.8* 8.6* 8.4*  GFRNONAA 44* 43* 45*  GFRAA 51* 50* 52*  ANIONGAP _0 Recent Labs  Lab 04/01/18 1432  PROT 6.6  ALBUMIN 3.7  AST 25  ALT 19  ALKPHOS 57  BILITOT 0.9   Hematology Recent Labs  Lab 04/02/18 0259 04/03/18 0304 04/04/18 0426   WBC 8.2 24.0* 15.2*  RBC 3.49* 3.61* 3.93*  HGB 11.8* 12.0* 13.3  HCT 35.3* 36.2* 40.0  MCV 101.1* 100.3* 101.8*  MCH 33.8 33.2 33.8  MCHC 33.4 33.1 33.3  RDW 12.8 13.2 13.2  PLT 287 365 338   Cardiac EnzymesNo results for input(s): TROPONINI in the last 168 hours.  Recent Labs  Lab 04/01/18 1436  TROPIPOC 0.01    BNPNo results for input(s): BNP, PROBNP in the last 168 hours.  DDimer No results for input(s): DDIMER in the last 168 hours.  Radiology/Studies:  Dg Shoulder Right  Result Date: 04/02/2018 CLINICAL DATA:  Right shoulder pain for several years EXAM: RIGHT SHOULDER - 2+ VIEW COMPARISON:  None. FINDINGS: Degenerative changes of the acromioclavicular joint are seen. No acute fracture or dislocation is noted. The humeral head is high-riding consistent with chronic right-sided cuff injury. The underlying bony thorax is within normal limits. No soft tissue changes are seen. IMPRESSION: Chronic changes without acute abnormality. Electronically Signed   By: Inez Catalina M.D.   On: 04/02/2018 14:17   Ct Head Wo Contrast  Result Date: 04/04/2018 CLINICAL DATA:  Subarachnoid hemorrhage EXAM: CT HEAD WITHOUT CONTRAST TECHNIQUE: Contiguous axial images were obtained from the base of the skull through the vertex without intravenous contrast. COMPARISON:  Head CT 04/01/2018, 04/02/2018 Brain MRI 04/02/2018 FINDINGS: Brain: Small volume left temporal subarachnoid hemorrhage has increased slightly from the prior study. The medial aspect is slightly more rounded in contour and could be intraparenchymal petechial hemorrhage. No contralateral hemorrhage or discrete new site of hemorrhage. There is no midline shift or mass effect. There is periventricular hypoattenuation compatible with chronic microvascular disease. Vascular: There is atherosclerotic calcification of the internal carotid arteries at the skull base. Skull: Normal Sinuses/Orbits: Paranasal sinuses are clear.  The orbits are normal.  Other: None. IMPRESSION: Slightly increased volume of left temporal lobe subarachnoid hemorrhage, remote from the site of ischemia demonstrated on the MRI of 04/02/2018. There may be a small amount of superimposed intraparenchymal petechial hemorrhage. Heidelberg classification 3c: Subarachnoid hemorrhage, remote from area of infarcted brain tissue. Electronically Signed   By: Ulyses Jarred M.D.   On: 04/04/2018 06:59   Ct Head Wo Contrast  Result Date: 04/02/2018 CLINICAL DATA:  Stroke follow-up, 24 hours post tPA EXAM: CT HEAD WITHOUT CONTRAST TECHNIQUE: Contiguous axial images were obtained from the base of the skull through the vertex without intravenous contrast. COMPARISON:  Yesterday FINDINGS: Brain: Small volume subarachnoid hemorrhage within the left temporoparietal sulcus, in retrospect stable from prior. No visible infarct, mass, hydrocephalus, or collection. Vascular: Atherosclerotic calcification Skull: No acute or aggressive finding Sinuses/Orbits: Chronic left maxillary sinusitis. Critical Value/emergent results were called by telephone at the time of interpretation on 04/02/2018 at 1:57 pm to Dr. Rosalin Hawking , who verbally acknowledged these results. IMPRESSION: 1. Small volume subarachnoid hemorrhage along the left temporoparietal convexity, in retrospect stable from yesterday. 2. No visible acute infarct. Electronically Signed   By: Monte Fantasia M.D.   On: 04/02/2018 14:03   Mr Brain Wo Contrast  Result Date: 04/02/2018 CLINICAL DATA:  Stroke. EXAM: MRI HEAD WITHOUT CONTRAST  TECHNIQUE: Multiplanar, multiecho pulse sequences of the brain and surrounding structures were obtained without intravenous contrast. COMPARISON:  CT head 04/01/2018. CT head 04/02/2018. CTA head 04/01/2018. FINDINGS: Brain: Acute LEFT inferior medullary infarct, restricted diffusion, corresponding low ADC, no blood products, LEFT PICA territory, correlates with LEFT vertebral and PICA occlusion on CTA. Elsewhere no  acute stroke, mass lesion, or hydrocephalus. Atrophy with chronic microvascular ischemic change. Acute subarachnoid blood in the LEFT posterior temporal region correlates with the CT abnormalities, and appears increased from the original CT, when technique differences are considered. No draining vein. There may be a tiny focus of parenchymal hemorrhage of an acute nature, see series 12, image 24. Etiology unknown, query occult trauma, with shearing injury. Vascular: Flow voids are maintained in the carotid, basilar, and RIGHT vertebral arteries. LEFT vertebral demonstrates lack of flow void, consistent with acute occlusion. Skull and upper cervical spine: Normal marrow signal. Sinuses/Orbits: Chronic sinus disease. Hypoplastic LEFT maxillary sinus. Negative orbits. Other: Normal mastoids. IMPRESSION: Acute LEFT medullary brainstem infarct correlating with recent LEFT vertebral and PICA occlusion. LEFT posterior temporal subarachnoid hemorrhage, uncertain etiology, appears to be increased volume from original CT, perhaps tPA related. Atrophy with chronic microvascular ischemic change. Electronically Signed   By: Staci Righter M.D.   On: 04/02/2018 14:34   Dg Chest Port 1 View  Result Date: 04/03/2018 CLINICAL DATA:  Congestion, weakness. EXAM: PORTABLE CHEST 1 VIEW COMPARISON:  None. FINDINGS: The heart size and mediastinal contours are within normal limits. Atherosclerosis of thoracic aorta is noted. No pneumothorax or pleural effusion is noted. Right lung is clear. Minimal left basilar subsegmental atelectasis is noted. The visualized skeletal structures are unremarkable. IMPRESSION: Minimal left basilar subsegmental atelectasis. Aortic Atherosclerosis (ICD10-I70.0). Electronically Signed   By: Marijo Conception, M.D.   On: 04/03/2018 12:34   Dg Swallowing Func-speech Pathology  Result Date: 04/05/2018 Objective Swallowing Evaluation: Type of Study: MBS-Modified Barium Swallow Study  Patient Details Name:  QUASHAWN JEWKES MRN: [id number] Date of Birth: June 20, 1922 Today's Date: 04/05/2018 Time: SLP Start Time (ACUTE ONLY): 1043 -SLP Stop Time (ACUTE ONLY): 1114 SLP Time Calculation (min) (ACUTE ONLY): 31 min Past Medical History: Past Medical History: Diagnosis Date . BPH (benign prostatic hyperplasia) 10/14/2014 . Cervicalgia 01/04/2016 . Contusion of left shoulder 05/27/14 . DM type 2 (diabetes mellitus, type 2) ([place])  . High blood pressure  . History of basal cell cancer 11/21/2005  Right temple . History of colonic polyps 10/14/2003 . Hyperlipidemia  . Left knee pain 05/27/14 . SAH (subarachnoid hemorrhage) ([place]) 2009  TBI . TIA (transient ischemic attack) 11/06/2005  Left eye pain. Slurred speech. Diaphoresis. No residual effects.  Past Surgical History: Past Surgical History: Procedure Laterality Date . Achilles tendon repair Left 1998  ruptured tendon . COLONOSCOPY  1992  Polyp removed . COLONOSCOPY  1998  normal . ORIF FEMUR FRACTURE Right 1929 . TONSILLECTOMY   HPI: 84 y.o. male with PMH of HTN, HLD, DM, PVD presented with acute onset difficulty speech and swallowing, left-sided ataxia.  CT head showed stable atrophy and white matter disease, likely within normal limits for age. MRI acute left medullary infarct  Subjective: alert and pleasant Assessment / Plan / Recommendation CHL IP CLINICAL IMPRESSIONS 04/05/2018 Clinical Impression Pt presents with notable improvement from [facility] 10/8 with continued moderate pharyngeal dysphagia today characterized by decreased contraction and duration of swallow resulting in moderate pharyngeal residue. Aspiration of thin liquids before, during and after the swallow was sensed and imaging was unclear however suspect  clearance of aspirates due to strong reflexive cough. Airway protection sufficient with NTL with moderate residue in the valleculae and pyriforms that was successfully cleared followed by a STRONG throat clear and 2-3 additional dry swallows. AP view reveals mildly  increased residue on the L however R head tilt and L head turn were both ineffective in facilitating clearance of residue. Improved UES opening secondary to improved laryngeal mobility from initial study resulting in decreased residue in the pyriforms and improved ability to clear residue. Continued severe residue of puree in the valleculae was unsuccessfully cleared with 3-4 additional swallows and note chin tuck and NTL wash were minimally effective in facilitating clearance of residue. Recommend NECTAR thick liquid diet with continuation of exercise plan and repeat MBS Monday 10/14 given rapid spontaneous improvement. Strategies: following sip of NTL demonstrate throat clear and 2-3 dry swallows.  SLP Visit Diagnosis Dysphagia, oropharyngeal phase (R13.12) Attention and concentration deficit following -- Frontal lobe and executive function deficit following -- Impact on safety and function Moderate aspiration risk   CHL IP TREATMENT RECOMMENDATION 04/05/2018 Treatment Recommendations Therapy as outlined in treatment plan below   Prognosis 04/05/2018 Prognosis for Safe Diet Advancement Fair Barriers to Reach Goals Severity of deficits Barriers/Prognosis Comment -- CHL IP DIET RECOMMENDATION 04/05/2018 SLP Diet Recommendations Nectar thick liquid Liquid Administration via Straw;Cup Medication Administration Other (Comment) Compensations Minimize environmental distractions;Small sips/bites;Clear throat after each swallow;Slow rate;Multiple dry swallows after each bite/sip Postural Changes Remain semi-upright after after feeds/meals (Comment);Seated upright at 90 degrees   CHL IP OTHER RECOMMENDATIONS 04/05/2018 Recommended Consults -- Oral Care Recommendations Oral care BID Other Recommendations Order thickener from pharmacy   CHL IP FOLLOW UP RECOMMENDATIONS 04/05/2018 Follow up Recommendations Inpatient Rehab   CHL IP FREQUENCY AND DURATION 04/05/2018 Speech Therapy Frequency (ACUTE ONLY) min 2x/week Treatment  Duration 1 week      CHL IP ORAL PHASE 04/05/2018 Oral Phase WFL Oral - Pudding Teaspoon -- Oral - Pudding Cup -- Oral - Honey Teaspoon -- Oral - Honey Cup -- Oral - Nectar Teaspoon -- Oral - Nectar Cup -- Oral - Nectar Straw -- Oral - Thin Teaspoon -- Oral - Thin Cup -- Oral - Thin Straw -- Oral - Puree -- Oral - Mech Soft -- Oral - Regular -- Oral - Multi-Consistency -- Oral - Pill -- Oral Phase - Comment --  CHL IP PHARYNGEAL PHASE 04/05/2018 Pharyngeal Phase Impaired Pharyngeal- Pudding Teaspoon -- Pharyngeal -- Pharyngeal- Pudding Cup -- Pharyngeal -- Pharyngeal- Honey Teaspoon -- Pharyngeal -- Pharyngeal- Honey Cup -- Pharyngeal -- Pharyngeal- Nectar Teaspoon -- Pharyngeal -- Pharyngeal- Nectar Cup -- Pharyngeal -- Pharyngeal- Nectar Straw Penetration/Aspiration during swallow;Pharyngeal residue - valleculae;Pharyngeal residue - pyriform;Reduced pharyngeal peristalsis;Reduced anterior laryngeal mobility;Reduced airway/laryngeal closure;Delayed swallow initiation-pyriform sinuses;Reduced epiglottic inversion Pharyngeal -- Pharyngeal- Thin Teaspoon Penetration/Aspiration during swallow;Pharyngeal residue - valleculae;Pharyngeal residue - pyriform;Reduced pharyngeal peristalsis;Reduced anterior laryngeal mobility;Reduced airway/laryngeal closure;Delayed swallow initiation-pyriform sinuses;Penetration/Aspiration before swallow;Penetration/Apiration after swallow;Reduced epiglottic inversion Pharyngeal Material enters airway, passes BELOW cords then ejected out Pharyngeal- Thin Cup Penetration/Aspiration during swallow;Pharyngeal residue - valleculae;Pharyngeal residue - pyriform;Reduced pharyngeal peristalsis;Reduced anterior laryngeal mobility;Reduced airway/laryngeal closure;Delayed swallow initiation-pyriform sinuses;Penetration/Aspiration before swallow;Penetration/Apiration after swallow;Reduced epiglottic inversion Pharyngeal Material enters airway, passes BELOW cords then ejected out Pharyngeal- Thin Straw  Penetration/Aspiration during swallow;Pharyngeal residue - valleculae;Pharyngeal residue - pyriform;Reduced pharyngeal peristalsis;Reduced anterior laryngeal mobility;Reduced airway/laryngeal closure;Delayed swallow initiation-pyriform sinuses;Reduced epiglottic inversion Pharyngeal Material enters airway, passes BELOW cords then ejected out Pharyngeal- Puree Reduced anterior laryngeal mobility;Reduced airway/laryngeal closure;Pharyngeal residue - valleculae;Pharyngeal residue - pyriform;Reduced epiglottic  inversion Pharyngeal -- Pharyngeal- Mechanical Soft -- Pharyngeal -- Pharyngeal- Regular -- Pharyngeal -- Pharyngeal- Multi-consistency -- Pharyngeal -- Pharyngeal- Pill -- Pharyngeal -- Pharyngeal Comment --  CHL IP CERVICAL ESOPHAGEAL PHASE 04/02/2018 Cervical Esophageal Phase (No Data) Pudding Teaspoon -- Pudding Cup -- Honey Teaspoon -- Honey Cup -- Nectar Teaspoon -- Nectar Cup -- Nectar Straw -- Thin Teaspoon -- Thin Cup -- Thin Straw -- Puree -- Mechanical Soft -- Regular -- Multi-consistency -- Pill -- Cervical Esophageal Comment -- Amelia H. Roddie Mc, CCC-SLP Speech Language Pathologist Wende Bushy 04/05/2018, 2:58 PM              Dg Swallowing Func-speech Pathology  Result Date: 04/02/2018 Objective Swallowing Evaluation: Type of Study: MBS-Modified Barium Swallow Study  Patient Details Name: JERAMY DIMMICK MRN: [id number] Date of Birth: January 16, 1922 Today's Date: 04/02/2018 Time: SLP Start Time (ACUTE ONLY): 6789 -SLP Stop Time (ACUTE ONLY): 1330 SLP Time Calculation (min) (ACUTE ONLY): 35 min Past Medical History: Past Medical History: Diagnosis Date . BPH (benign prostatic hyperplasia) 10/14/2014 . Cervicalgia 01/04/2016 . Contusion of left shoulder 05/27/14 . DM type 2 (diabetes mellitus, type 2) ([place])  . High blood pressure  . History of basal cell cancer 11/21/2005  Right temple . History of colonic polyps 10/14/2003 . Hyperlipidemia  . Left knee pain 05/27/14 . SAH (subarachnoid hemorrhage)  ([place]) 2009  TBI . TIA (transient ischemic attack) 11/06/2005  Left eye pain. Slurred speech. Diaphoresis. No residual effects.  Past Surgical History: Past Surgical History: Procedure Laterality Date . Achilles tendon repair Left 1998  ruptured tendon . COLONOSCOPY  1992  Polyp removed . COLONOSCOPY  1998  normal . ORIF FEMUR FRACTURE Right 1929 . TONSILLECTOMY   HPI: 84 y.o. male with PMH of HTN, HLD, DM, PVD presented with acute onset difficulty speech and swallowing, left-sided ataxia.  CT head showed stable atrophy and white matter disease, likely within normal limits for age. MRI pending.  Subjective: alert, communicative Assessment / Plan / Recommendation CHL IP CLINICAL IMPRESSIONS 04/02/2018 Clinical Impression Pt presents with a severe pharyngeal dysphagia marked by impaired mobility of the larynx and pharynx, leading to incomplete laryngeal vestibular closure (LVC) and poor pharyngeal squeeze.  There is sensed aspiration of liquids before and during the swallow due to incomplete LVC and after the swallow due to spillage of residue from the pyriforms. There is severe vallecular and pyriform residue that pt is unable to clear despite postural adjustments.  UES only partially opens due to compromised elevation of the larynx - this contributes to the degree of pyriform residue.  Pt self-suctioned and worked to expectorate residue after the study.  For now, recommend continuing NPO; consider temporary enteral feeding via cortrak (spoke with Dr. Erlinda Hong); SLP to address therapeutic exercise.  Allow occasional ice chips after oral care.  SLP Visit Diagnosis Dysphagia, pharyngeal phase (R13.13) Attention and concentration deficit following -- Frontal lobe and executive function deficit following -- Impact on safety and function Severe aspiration risk   [facility] IP TREATMENT RECOMMENDATION 04/02/2018 Treatment Recommendations Therapy as outlined in treatment plan below   Prognosis 04/02/2018 Prognosis for Safe Diet Advancement  Fair Barriers to Reach Goals -- Barriers/Prognosis Comment -- [facility] IP DIET RECOMMENDATION 04/02/2018 SLP Diet Recommendations NPO;Alternative means - temporary Liquid Administration via -- Medication Administration -- Compensations -- Postural Changes --   CHL IP OTHER RECOMMENDATIONS 04/02/2018 Recommended Consults -- Oral Care Recommendations Oral care QID Other Recommendations --   [facility] IP FOLLOW UP RECOMMENDATIONS 04/02/2018 Follow up  Recommendations Inpatient Rehab   [facility] IP FREQUENCY AND DURATION 04/02/2018 Speech Therapy Frequency (ACUTE ONLY) min 3x week Treatment Duration 1 week      CHL IP ORAL PHASE 04/02/2018 Oral Phase Impaired Oral - Pudding Teaspoon -- Oral - Pudding Cup -- Oral - Honey Teaspoon -- Oral - Honey Cup -- Oral - Nectar Teaspoon -- Oral - Nectar Cup -- Oral - Nectar Straw Piecemeal swallowing Oral - Thin Teaspoon -- Oral - Thin Cup -- Oral - Thin Straw Piecemeal swallowing Oral - Puree Piecemeal swallowing Oral - Mech Soft -- Oral - Regular -- Oral - Multi-Consistency -- Oral - Pill -- Oral Phase - Comment --  [facility] IP PHARYNGEAL PHASE 04/02/2018 Pharyngeal Phase Impaired Pharyngeal- Pudding Teaspoon -- Pharyngeal -- Pharyngeal- Pudding Cup -- Pharyngeal -- Pharyngeal- Honey Teaspoon -- Pharyngeal -- Pharyngeal- Honey Cup -- Pharyngeal -- Pharyngeal- Nectar Teaspoon Delayed swallow initiation-vallecula;Reduced pharyngeal peristalsis;Reduced epiglottic inversion;Reduced laryngeal elevation;Reduced airway/laryngeal closure;Penetration/Aspiration during swallow;Penetration/Apiration after swallow;Pharyngeal residue - pyriform;Pharyngeal residue - valleculae;Penetration/Aspiration before swallow Pharyngeal Material enters airway, remains ABOVE vocal cords and not ejected out Pharyngeal- Nectar Cup -- Pharyngeal -- Pharyngeal- Nectar Straw Delayed swallow initiation-vallecula;Reduced laryngeal elevation;Reduced airway/laryngeal closure;Penetration/Aspiration before swallow;Penetration/Aspiration during  swallow;Penetration/Apiration after swallow;Pharyngeal residue - valleculae;Pharyngeal residue - pyriform Pharyngeal Material enters airway, remains ABOVE vocal cords and not ejected out Pharyngeal- Thin Teaspoon Delayed swallow initiation-pyriform sinuses;Reduced pharyngeal peristalsis;Reduced epiglottic inversion;Reduced laryngeal elevation;Reduced airway/laryngeal closure;Penetration/Aspiration before swallow;Trace aspiration;Pharyngeal residue - valleculae;Pharyngeal residue - pyriform Pharyngeal Material enters airway, passes BELOW cords and not ejected out despite cough attempt by patient Pharyngeal- Thin Cup -- Pharyngeal -- Pharyngeal- Thin Straw Delayed swallow initiation-pyriform sinuses;Reduced pharyngeal peristalsis;Reduced epiglottic inversion;Reduced laryngeal elevation;Reduced airway/laryngeal closure;Penetration/Aspiration before swallow;Penetration/Aspiration during swallow;Penetration/Apiration after swallow;Trace aspiration;Pharyngeal residue - valleculae;Pharyngeal residue - pyriform;Compensatory strategies attempted (with notebox) Pharyngeal Material enters airway, passes BELOW cords and not ejected out despite cough attempt by patient Pharyngeal- Puree Delayed swallow initiation-vallecula;Reduced pharyngeal peristalsis;Reduced epiglottic inversion;Reduced laryngeal elevation;Reduced airway/laryngeal closure;Pharyngeal residue - valleculae;Pharyngeal residue - pyriform Pharyngeal -- Pharyngeal- Mechanical Soft -- Pharyngeal -- Pharyngeal- Regular -- Pharyngeal -- Pharyngeal- Multi-consistency -- Pharyngeal -- Pharyngeal- Pill -- Pharyngeal -- Pharyngeal Comment --  [facility] IP CERVICAL ESOPHAGEAL PHASE 04/02/2018 Cervical Esophageal Phase (No Data) Pudding Teaspoon -- Pudding Cup -- Honey Teaspoon -- Honey Cup -- Nectar Teaspoon -- Nectar Cup -- Nectar Straw -- Thin Teaspoon -- Thin Cup -- Thin Straw -- Puree -- Mechanical Soft -- Regular -- Multi-consistency -- Pill -- Cervical Esophageal Comment --  [name] 04/02/2018, 1:50 PM               Assessment and Plan:   Paroxysmal atrial fibrillation -No prior cardiac history except murmur, no known arrhythmias. -Intermittent afib noted on tele overnight. Otherwise sinus rhythm with occ PVCs in the 90's. He has no awareness of the afib.  -CHA2DS2/VAS Stroke Risk Score is 6 (HTN, Age (2), DM, Stroke (2)). He should be anticoagulated for stroke risk reduction but not currently a candidate with recent stroke and SAH. Once OK with neurology would initiate eliquis 5 mg bid. Would need to watch renal function as his SCR was 1.51 earlier in the hospitalization, now 1.30. If his renal function worsens he would need dose adjustment to 2.5 mg.   -For now would pursue rate control strategy with addition of beta blocker.   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  Aortic stenosis -Pt reports known murmur since the 1960s -Moderate AS by echo with mean gradient 21 mmHg and mild AI -Follow along as an outpatient  Hypertension -Was treated at home with losartan 50 mg, now on hold in setting of stroke -Goal BP < 160 per neurology  Hyperlipidemia -On simvastatin 10 mg -LDL 80. Continue current therapy  Diabetes type 2 -A1c 7.6. Reasonably well controlled for his age.  PAD -Has occlusion of several lower extremity vessels. Has appt with [facility] on 11/5.  -He was on aspirin 325 mg at home, now on hold.       For questions or updates, please contact [place] Please consult [email or web address] for contact info under     Signed, Daune Perch, NP  04/05/2018 5:13 PM

## 2018-04-05 NOTE — Progress Notes (Signed)
Physical Therapy Treatment Patient Details Name: Nathan Cook MRN: [id number] DOB: 25-Oct-1921 Today's Date: 04/05/2018    History of Present Illness Pt is a 84 y.o. male with PMH of HTN, HLD, DM, PVD presented with acute onset difficulty speech and swallowing, left-sided ataxia.  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  PMH: DM; HTN; SAH (2009); TIA.     PT Comments    Pt making slow progress with functional mobility and remains limited secondary to weakness, impaired balance and poor coordination of R side. Pt would continue to benefit from skilled physical therapy services at this time while admitted and after d/c to address the below listed limitations in order to improve overall safety and independence with functional mobility.    Follow Up Recommendations  SNF;Supervision/Assistance - 24 hour;Other (comment)(pt's family with preference for [facility])     Equipment Recommendations  Rolling walker with 5" wheels;3in1 (PT);Wheelchair (measurements PT);Wheelchair cushion (measurements PT)    Recommendations for Other Services       Precautions / Restrictions Precautions Precautions: Fall Restrictions Weight Bearing Restrictions: No    Mobility  Bed Mobility               General bed mobility comments: pt OOB in recliner chair upon arrival  Transfers Overall transfer level: Needs assistance Equipment used: Rolling walker (2 wheeled) Transfers: Sit to/from Stand Sit to Stand: Min assist         General transfer comment: increased time and effort, cueing for safe hand placement, min A to help keep R hand on chair arm rest and assist for stability with transition; pt performed x4  Ambulation/Gait                 Stairs             Wheelchair Mobility    Modified Rankin (Stroke Patients Only) Modified Rankin (Stroke Patients Only) Pre-Morbid Rankin Score: No symptoms Modified Rankin: Severe disability     Balance  Overall balance assessment: Needs assistance Sitting-balance support: Feet supported Sitting balance-Leahy Scale: Fair     Standing balance support: Bilateral upper extremity supported Standing balance-Leahy Scale: Poor                              Cognition Arousal/Alertness: Awake/alert Behavior During Therapy: Impulsive Overall Cognitive Status: Impaired/Different from baseline Area of Impairment: Attention;Following commands;Safety/judgement;Problem solving                   Current Attention Level: Sustained   Following Commands: Follows one step commands inconsistently;Follows one step commands with increased time Safety/Judgement: Decreased awareness of deficits;Decreased awareness of safety Awareness: Emergent Problem Solving: Slow processing;Difficulty sequencing;Requires verbal cues;Requires tactile cues        Exercises General Exercises - Lower Extremity Long Arc Quad: AROM;Both;10 reps;Seated Hip Flexion/Marching: Seated;AROM;Both;20 reps Other Exercises Other Exercises: bilateral UE coordination RAM exercise with demonstration; pt with obvious decreased speed and coordination on R    General Comments        Pertinent Vitals/Pain Pain Assessment: No/denies pain    Home Living                      Prior Function            PT Goals (current goals can now be found in the care plan section) Acute Rehab PT Goals PT Goal Formulation: With patient/family Time  For Goal Achievement: 04/17/18 Potential to Achieve Goals: Good Progress towards PT goals: Progressing toward goals    Frequency    Min 3X/week      PT Plan Current plan remains appropriate    Co-evaluation              AM-PAC PT "6 Clicks" Daily Activity  Outcome Measure  Difficulty turning over in bed (including adjusting bedclothes, sheets and blankets)?: Unable Difficulty moving from lying on back to sitting on the side of the bed? :  Unable Difficulty sitting down on and standing up from a chair with arms (e.g., wheelchair, bedside commode, etc,.)?: Unable Help needed moving to and from a bed to chair (including a wheelchair)?: A Lot Help needed walking in hospital room?: A Lot Help needed climbing 3-5 steps with a railing? : Total 6 Click Score: 8    End of Session Equipment Utilized During Treatment: Gait belt Activity Tolerance: Patient tolerated treatment well Patient left: in chair;with call bell/phone within reach;with chair alarm set Nurse Communication: Mobility status PT Visit Diagnosis: Muscle weakness (generalized) (M62.81);Difficulty in walking, not elsewhere classified (R26.2);Hemiplegia and hemiparesis Hemiplegia - Right/Left: Right Hemiplegia - dominant/non-dominant: Dominant Hemiplegia - caused by: Cerebral infarction;Nontraumatic SAH     Time: 3299-2426 PT Time Calculation (min) (ACUTE ONLY): 16 min  Charges:  $Therapeutic Activity: 8-22 mins                     Sherie Don, PT, DPT  Acute Rehabilitation Services Pager [phone] Office [place] 04/05/2018, 4:51 PM

## 2018-04-06 ENCOUNTER — Inpatient Hospital Stay (HOSPITAL_COMMUNITY): Payer: Medicare Other

## 2018-04-06 DIAGNOSIS — T17908A Unspecified foreign body in respiratory tract, part unspecified causing other injury, initial encounter: Secondary | ICD-10-CM

## 2018-04-06 LAB — GLUCOSE, CAPILLARY
GLUCOSE-CAPILLARY: 135 mg/dL — AB (ref 70–99)
GLUCOSE-CAPILLARY: 151 mg/dL — AB (ref 70–99)
Glucose-Capillary: 192 mg/dL — ABNORMAL HIGH (ref 70–99)
Glucose-Capillary: 109 mg/dL — ABNORMAL HIGH (ref 70–99)
Glucose-Capillary: 155 mg/dL — ABNORMAL HIGH (ref 70–99)
Glucose-Capillary: 236 mg/dL — ABNORMAL HIGH (ref 70–99)

## 2018-04-06 LAB — CBC
HEMATOCRIT: 40.5 % (ref 39.0–52.0)
HEMOGLOBIN: 13.5 g/dL (ref 13.0–17.0)
MCH: 34 pg (ref 26.0–34.0)
MCHC: 33.3 g/dL (ref 30.0–36.0)
MCV: 102 fL — AB (ref 80.0–100.0)
PLATELETS: 336 10*3/uL (ref 150–400)
RBC: 3.97 MIL/uL — ABNORMAL LOW (ref 4.22–5.81)
RDW: 13 % (ref 11.5–15.5)
WBC: 12.4 10*3/uL — ABNORMAL HIGH (ref 4.0–10.5)
nRBC: 0 % (ref 0.0–0.2)

## 2018-04-06 LAB — BASIC METABOLIC PANEL
Anion gap: 7 (ref 5–15)
BUN: 24 mg/dL — AB (ref 8–23)
CHLORIDE: 110 mmol/L (ref 98–111)
CO2: 24 mmol/L (ref 22–32)
Calcium: 8.4 mg/dL — ABNORMAL LOW (ref 8.9–10.3)
Creatinine, Ser: 1.1 mg/dL (ref 0.61–1.24)
GFR calc Af Amer: >60 mL/min (ref >60)
GFR calc non Af Amer: 55 mL/min — ABNORMAL LOW (ref >60)
GLUCOSE: 141 mg/dL — AB (ref 70–99)
POTASSIUM: 3.7 mmol/L (ref 3.5–5.1)
Sodium: 141 mmol/L (ref 135–145)

## 2018-04-06 MED ORDER — RESOURCE THICKENUP CLEAR PO POWD
ORAL | Status: DC | PRN
  Filled 2018-04-06 (×2): qty 125

## 2018-04-06 NOTE — Progress Notes (Signed)
STROKE TEAM PROGRESS NOTE   SUBJECTIVE (INTERVAL HISTORY) Wife and granddaughter are at bedside. Lying in the bed. Had nectar thick liquid diet last night and this am, but RN reported cough and gurgling sound in throat, concerning for aspiration. Therefore, NPO restarted and speech was called for further evaluation. PAF was confirmed by cardiology, and will consider anticoagulation if no further procedure planned and his SAH resolves.   OBJECTIVE Temp:  [97.8 F (36.6 C)-98.4 F (36.9 C)] 97.8 F (36.6 C) (10/12 1129) Pulse Rate:  [85-101] 85 (10/12 1129) Cardiac Rhythm: Normal sinus rhythm;Bundle branch block;Heart block (10/12 0729) Resp:  [15-20] 20 (10/12 1129) BP: (145-173)/(76-95) 154/83 (10/12 1129) SpO2:  [93 %-96 %] 94 % (10/12 1129)  Recent Labs  Lab 04/05/18 1944 04/06/18 0017 04/06/18 0308 04/06/18 0800 04/06/18 1125  GLUCAP 210* 135* 192* 109* 155*   Recent Labs  Lab 04/01/18 1432 04/01/18 1438 04/02/18 0259 04/03/18 0304 04/04/18 0426 04/06/18 0348  NA 135 137 135 136 138 141  K 4.2 4.2 4.0 3.7 3.8 3.7  CL 103 104 103 105 105 110  CO2 22  --  22 22 18* 24  GLUCOSE 161* 162* 205* 148* 156* 141*  BUN 19 20 24* 28* 28* 24*  CREATININE 1.51* 1.50* 1.34* 1.36* 1.30* 1.10  CALCIUM 9.4  --  8.8* 8.6* 8.4* 8.4*   Recent Labs  Lab 04/01/18 1432  AST 25  ALT 19  ALKPHOS 57  BILITOT 0.9  PROT 6.6  ALBUMIN 3.7   Recent Labs  Lab 04/01/18 1432 04/01/18 1438 04/02/18 0259 04/03/18 0304 04/04/18 0426 04/06/18 0348  WBC 8.9  --  8.2 24.0* 15.2* 12.4*  NEUTROABS 6.3  --   --   --   --   --   HGB 12.7* 12.2* 11.8* 12.0* 13.3 13.5  HCT 38.4* 36.0* 35.3* 36.2* 40.0 40.5  MCV 104.9*  --  101.1* 100.3* 101.8* 102.0*  PLT 317  --  287 365 338 336   No results for input(s): CKTOTAL, CKMB, CKMBINDEX, TROPONINI in the last 168 hours. No results for input(s): LABPROT, INR in the last 72 hours. No results for input(s): COLORURINE, LABSPEC, [place], GLUCOSEU,  HGBUR, BILIRUBINUR, KETONESUR, PROTEINUR, UROBILINOGEN, NITRITE, LEUKOCYTESUR in the last 72 hours.  Invalid input(s): APPERANCEUR   IMAGING Ct Angio Head W Or Wo Contrast  Result Date: 04/01/2018 CLINICAL DATA:  Stroke EXAM: CT ANGIOGRAPHY HEAD AND NECK TECHNIQUE: Multidetector CT imaging of the head and neck was performed using the standard protocol during bolus administration of intravenous contrast. Multiplanar CT image reconstructions and MIPs were obtained to evaluate the vascular anatomy. Carotid stenosis measurements (when applicable) are obtained utilizing NASCET criteria, using the distal internal carotid diameter as the denominator. CONTRAST:  40mL ISOVUE-370 IOPAMIDOL (ISOVUE-370) INJECTION 76% COMPARISON:  CT head 04/01/2018 FINDINGS: CTA NECK FINDINGS Aortic arch: Atherosclerotic calcification aortic arch and proximal great vessels. Proximal great vessels widely patent. Right carotid system: Atherosclerotic calcification right carotid bulb without significant stenosis. Left carotid system: Atherosclerotic calcification left carotid bulb. 25% diameter stenosis internal carotid artery on the left. Remainder of the left internal carotid artery widely patent. Vertebral arteries: Right vertebral artery dominant. Moderate stenosis origin of right vertebral artery. Remainder of the right vertebral artery is widely patent to the basilar with mild atherosclerotic disease distally. Moderate stenosis at the origin of the left vertebral artery. Left vertebral artery diffusely diseased and occludes at the C1 level and does not continue to the basilar. Left PICA not visualized. Skeleton:  Cervical spondylosis without acute skeletal abnormality. Other neck: Negative Upper chest: Mild bronchiectasis and scarring in the right upper lobe. 5 mm right upper lobe nodule axial image 158 Review of the MIP images confirms the above findings CTA HEAD FINDINGS Anterior circulation: Atherosclerotic calcification in the  cavernous carotid bilaterally causing mild stenosis bilaterally. Anterior and middle cerebral arteries patent bilaterally without stenosis or large vessel occlusion Posterior circulation: Right vertebral artery patent to the basilar with mild calcific stenosis V4 segment. Right PICA is widely patent. Occlusion of left vertebral artery at the C1 level without reconstitution. Left PICA not visualized. Basilar widely patent. Mild atherosclerotic disease in the basilar artery. Superior cerebellar and posterior cerebral arteries patent bilaterally without stenosis. Venous sinuses: Patent Anatomic variants: None Delayed phase: Not performed Review of the MIP images confirms the above findings IMPRESSION: Mild atherosclerotic disease in the carotid bifurcation bilaterally. Atherosclerotic calcification and mild stenosis in the cavernous carotid bilaterally. Moderate stenosis at the origin of the vertebral artery bilaterally. Occlusion distal left vertebral artery at the C1 level. Left PICA occluded. This could be acute or chronic. Correlate with neurologic examination and MRI. These results were called by telephone at the time of interpretation on 04/01/2018 at 3:18 pm to Dr. Rosalin Hawking , who verbally acknowledged these results. Electronically Signed   By: Franchot Gallo M.D.   On: 04/01/2018 15:18   Ct Head Wo Contrast  Result Date: 04/06/2018 CLINICAL DATA:  Stroke left medulla. TPA given. Follow-up subarachnoid hemorrhage. EXAM: CT HEAD WITHOUT CONTRAST TECHNIQUE: Contiguous axial images were obtained from the base of the skull through the vertex without intravenous contrast. COMPARISON:  CT head 04/04/2018, MRI 04/02/2018, CT 04/01/2018 FINDINGS: Brain: Subcentimeter hemorrhage in the left posterior temporal lobe is unchanged appears parenchymal. Adjacent small volume subarachnoid hemorrhage also unchanged. No new areas of hemorrhage Acute infarct left medulla best seen on MRI however hypodensity is seen in this  area on CT. Moderate atrophy and moderate chronic microvascular ischemic changes. No mass lesion. Vascular: Atherosclerotic calcification. 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 Negative orbit. Other: None IMPRESSION: Stable small volume hemorrhage in the left posterior temporal lobe and adjacent subarachnoid space. No new area of hemorrhage Acute infarct left medulla best seen on MRI. Electronically Signed   By: Franchot Gallo M.D.   On: 04/06/2018 10:22   Ct Head Wo Contrast  Result Date: 04/04/2018 CLINICAL DATA:  Subarachnoid hemorrhage EXAM: CT HEAD WITHOUT CONTRAST TECHNIQUE: Contiguous axial images were obtained from the base of the skull through the vertex without intravenous contrast. COMPARISON:  Head CT 04/01/2018, 04/02/2018 Brain MRI 04/02/2018 FINDINGS: Brain: Small volume left temporal subarachnoid hemorrhage has increased slightly from the prior study. The medial aspect is slightly more rounded in contour and could be intraparenchymal petechial hemorrhage. No contralateral hemorrhage or discrete new site of hemorrhage. There is no midline shift or mass effect. There is periventricular hypoattenuation compatible with chronic microvascular disease. Vascular: There is atherosclerotic calcification of the internal carotid arteries at the skull base. Skull: Normal Sinuses/Orbits: Paranasal sinuses are clear.  The orbits are normal. Other: None. IMPRESSION: Slightly increased volume of left temporal lobe subarachnoid hemorrhage, remote from the site of ischemia demonstrated on the MRI of 04/02/2018. There may be a small amount of superimposed intraparenchymal petechial hemorrhage. Heidelberg classification 3c: Subarachnoid hemorrhage, remote from area of infarcted brain tissue. Electronically Signed   By: Ulyses Jarred M.D.   On: 04/04/2018 06:59  Ct Head Wo Contrast  Result Date: 04/02/2018 CLINICAL DATA:   Stroke follow-up, 24 hours post tPA EXAM: CT HEAD WITHOUT CONTRAST TECHNIQUE: Contiguous axial images were obtained from the base of the skull through the vertex without intravenous contrast. COMPARISON:  Yesterday FINDINGS: Brain: Small volume subarachnoid hemorrhage within the left temporoparietal sulcus, in retrospect stable from prior. No visible infarct, mass, hydrocephalus, or collection. Vascular: Atherosclerotic calcification Skull: No acute or aggressive finding Sinuses/Orbits: Chronic left maxillary sinusitis. Critical Value/emergent results were called by telephone at the time of interpretation on 04/02/2018 at 1:57 pm to Dr. Rosalin Hawking , who verbally acknowledged these results. IMPRESSION: 1. Small volume subarachnoid hemorrhage along the left temporoparietal convexity, in retrospect stable from yesterday. 2. No visible acute infarct. Electronically Signed   By: Monte Fantasia M.D.   On: 04/02/2018 14:03   Ct Angio Neck W And/or Wo Contrast  Result Date: 04/01/2018 CLINICAL DATA:  Stroke EXAM: CT ANGIOGRAPHY HEAD AND NECK TECHNIQUE: Multidetector CT imaging of the head and neck was performed using the standard protocol during bolus administration of intravenous contrast. Multiplanar CT image reconstructions and MIPs were obtained to evaluate the vascular anatomy. Carotid stenosis measurements (when applicable) are obtained utilizing NASCET criteria, using the distal internal carotid diameter as the denominator. CONTRAST:  58mL ISOVUE-370 IOPAMIDOL (ISOVUE-370) INJECTION 76% COMPARISON:  CT head 04/01/2018 FINDINGS: CTA NECK FINDINGS Aortic arch: Atherosclerotic calcification aortic arch and proximal great vessels. Proximal great vessels widely patent. Right carotid system: Atherosclerotic calcification right carotid bulb without significant stenosis. Left carotid system: Atherosclerotic calcification left carotid bulb. 25% diameter stenosis internal carotid artery on the left. Remainder of the left  internal carotid artery widely patent. Vertebral arteries: Right vertebral artery dominant. Moderate stenosis origin of right vertebral artery. Remainder of the right vertebral artery is widely patent to the basilar with mild atherosclerotic disease distally. Moderate stenosis at the origin of the left vertebral artery. Left vertebral artery diffusely diseased and occludes at the C1 level and does not continue to the basilar. Left PICA not visualized. Skeleton: Cervical spondylosis without acute skeletal abnormality. Other neck: Negative Upper chest: Mild bronchiectasis and scarring in the right upper lobe. 5 mm right upper lobe nodule axial image 158 Review of the MIP images confirms the above findings CTA HEAD FINDINGS Anterior circulation: Atherosclerotic calcification in the cavernous carotid bilaterally causing mild stenosis bilaterally. Anterior and middle cerebral arteries patent bilaterally without stenosis or large vessel occlusion Posterior circulation: Right vertebral artery patent to the basilar with mild calcific stenosis V4 segment. Right PICA is widely patent. Occlusion of left vertebral artery at the C1 level without reconstitution. Left PICA not visualized. Basilar widely patent. Mild atherosclerotic disease in the basilar artery. Superior cerebellar and posterior cerebral arteries patent bilaterally without stenosis. Venous sinuses: Patent Anatomic variants: None Delayed phase: Not performed Review of the MIP images confirms the above findings IMPRESSION: Mild atherosclerotic disease in the carotid bifurcation bilaterally. Atherosclerotic calcification and mild stenosis in the cavernous carotid bilaterally. Moderate stenosis at the origin of the vertebral artery bilaterally. Occlusion distal left vertebral artery at the C1 level. Left PICA occluded. This could be acute or chronic. Correlate with neurologic examination and MRI. These results were called by telephone at the time of interpretation on  04/01/2018 at 3:18 pm to Dr. Rosalin Hawking , who verbally acknowledged these results. Electronically Signed   By: Franchot Gallo M.D.   On: 04/01/2018 15:18   Mr Brain Wo Contrast  Result Date: 04/02/2018 CLINICAL DATA:  Stroke. EXAM: MRI HEAD WITHOUT CONTRAST TECHNIQUE: Multiplanar, multiecho pulse sequences of the brain and surrounding structures were obtained without intravenous contrast. COMPARISON:  CT head 04/01/2018. CT head 04/02/2018. CTA head 04/01/2018. FINDINGS: Brain: Acute LEFT inferior medullary infarct, restricted diffusion, corresponding low ADC, no blood products, LEFT PICA territory, correlates with LEFT vertebral and PICA occlusion on CTA. Elsewhere no acute stroke, mass lesion, or hydrocephalus. Atrophy with chronic microvascular ischemic change. Acute subarachnoid blood in the LEFT posterior temporal region correlates with the CT abnormalities, and appears increased from the original CT, when technique differences are considered. No draining vein. There may be a tiny focus of parenchymal hemorrhage of an acute nature, see series 12, image 24. Etiology unknown, query occult trauma, with shearing injury. Vascular: Flow voids are maintained in the carotid, basilar, and RIGHT vertebral arteries. LEFT vertebral demonstrates lack of flow void, consistent with acute occlusion. Skull and upper cervical spine: Normal marrow signal. Sinuses/Orbits: Chronic sinus disease. Hypoplastic LEFT maxillary sinus. Negative orbits. Other: Normal mastoids. IMPRESSION: Acute LEFT medullary brainstem infarct correlating with recent LEFT vertebral and PICA occlusion. LEFT posterior temporal subarachnoid hemorrhage, uncertain etiology, appears to be increased volume from original CT, perhaps tPA related. Atrophy with chronic microvascular ischemic change. Electronically Signed   By: Staci Righter M.D.   On: 04/02/2018 14:34          Ct Head Code Stroke Wo Contrast  Result Date: 04/01/2018 CLINICAL DATA:  Code  stroke. Code stroke. Difficulty swallowing. Left-sided weakness. Last seen normal 1 hour ago. EXAM: CT HEAD WITHOUT CONTRAST TECHNIQUE: Contiguous axial images were obtained from the base of the skull through the vertex without intravenous contrast. COMPARISON:  CT head without contrast 10/04/2017 FINDINGS: Brain: Atrophy and white matter changes are within normal limits for age. Basal ganglia are intact. No acute infarct, hemorrhage, or mass lesion is present. Insular cortex is within normal limits. No acute or focal cortical abnormality is present ventricles are proportionate to the degree of atrophy. No significant extra-axial fluid collection is present. The brainstem and cerebellum are normal. Vascular: Atherosclerotic calcifications are present the cavernous internal carotid arteries bilaterally and at the dural margin of both vertebral arteries. There is no hyperdense vessel. Skull: Calvarium is intact. No focal lytic or blastic lesions are present. Sinuses/Orbits: Chronic left maxillary sinus disease present. The paranasal sinuses and mastoid air cells are otherwise clear. Globes and orbits are within normal limits bilaterally. ASPECTS [facility] Stroke Program Early CT Score) - Ganglionic level infarction (caudate, lentiform nuclei, internal capsule, insula, M1-M3 cortex): 7/7 - Supraganglionic infarction (M4-M6 cortex): 3/3 Total score (0-10 with 10 being normal): 10/10 IMPRESSION: 1. Stable atrophy and white matter disease, likely within normal limits for age. 2. No acute intracranial abnormality. 3. Atherosclerosis. 4. ASPECTS is 10/10 The above was relayed via text pager to Dr. Erlinda Hong on 04/01/2018 at 14:49 . Electronically Signed   By: San Morelle M.D.   On: 04/01/2018 14:51     PHYSICAL EXAM General - Well nourished, well developed, no acute distress Cardiovascular - Regular rate and rhythm. Skin - bruise b/l medial arm/forearm, L>R.  Mental Status -  Level of arousal and orientation to  month, year, upcoming holiday, self, age and person were intact. Language including expression, naming, repetition, comprehension was assessed and found intact. Dysarthric. Hoarse improved.   Cranial Nerves II - XII - II - Visual field intact OU. III, IV, VI - Extraocular movements intact. V - Facial sensation intact bilaterally. VII - Facial movement intact  bilaterally. VIII - Hearing & vestibular intact bilaterally, few beats horizontal nystagmus on left gaze, direction to the left. X - Palate elevates symmetrically, improved hypophonia and hoarseness. XI - Chin turning & shoulder shrug intact bilaterally. XII - Tongue protrusion intact.  Motor Strength - The patient's strength was normal in all extremities right UE 3/5.  Bulk was normal and fasciculations were absent.   Motor Tone - Muscle tone was assessed at the neck and appendages and was normal.  Reflexes - The patient's reflexes were symmetrical in all extremities and he had no pathological reflexes.  Sensory - Light touch, temperature/pinprick were assessed and were symmetrical.    Coordination - The patient had dysmetria on the right hand FTN proportional to the weakness.  Tremor was absent.  Gait and Station - deferred.   ASSESSMENT/PLAN Nathan Cook is a 84 y.o. male with history of HTN, HLD, DM, PVD admitted for acute onset difficulty speech and swallowing, left-sided ataxia.  TPA given.    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 Once SAH resolves, and no further plan of procedure, consider start NOACs for stroke prevention  Ongoing aggressive stroke risk factor management  Therapy recommendations:  SNF, 3N1  Disposition:  Pending - from [facility] independent living. They will have SNF bed for him at d/c.  Left posterior temporal small SAH, asymptomatic  With retrospective review of CT head, it was present on the initial CT head  No change of small size on repeat CT and MRI  Seems not changed after tPA  Hold off anticoagulation at this time  Repeat CT head 04/06/18 - stable SAH  Once SAH resolves, and no further plan of procedure, consider start NOACs for stroke prevention  PAF  PAF episode on tele confirmed by cardiology  Appreciate card recs  Rate controlled so far  Once [facility] resolves, and no further plan of procedure, consider start NOACs for stroke prevention  Leukocytosis  WBC 8.2->24.0->15.2->12.4  Etiology unclear, aspiration ??   afebrile  UA no UTI  CXR minimal L basilar atx  Delirium, resolved  Due to ICU and hospitalization most likely  Off restraints   Consider seroquel when po access if needed  Diabetes  HgbA1c 7.6 goal < 7.0  Uncontrolled  Mild hyperglycemia, improving  CBG monitoring  SSI  close PCP follow up  Hypertension . Stable  BP goal < 160  Long term goal normotensive  Hyperlipidemia  Home meds:  zocor 10   LDL 80, goal < 70  Will increase to zocor 20 once po access  Continue statin at discharge  Dysphagia  Symptoms of swallow difficulty and hoarseness  passed swallow on nectar thick liquid diet -> coughing -> continue NPO   Speech continues to follow  On IVF to 65cc/h  Pt initially stated he would not want a tube - at second discussion, may consider a temporary tube. Will discuss further as needed  Other Stroke Risk Factors  Advanced age  PVD  Other Active Problems  CKD III - Cre 1.51->1.34->1.36-<1.30 ->  1.10  Hospital day # 5  Rosalin Hawking, MD PhD Stroke Neurology 04/06/2018 12:31 PM      To contact Stroke Continuity provider, please refer to [email or web address] After hours, contact General Neurology

## 2018-04-06 NOTE — Progress Notes (Addendum)
Pt with increase secretion and coughing after full liquid diet. Lung sounds noted "gurling". MD notify concerning for aspiration. NPO per protocol at this time.    Ave Filter, RN

## 2018-04-06 NOTE — Progress Notes (Signed)
  Speech Language Pathology Treatment: Dysphagia  Patient Details Name: Nathan Cook MRN: [id number] DOB: 1922/01/25 Today's Date: 04/06/2018 Time: 3086-5784 SLP Time Calculation (min) (ACUTE ONLY): 25 min  Assessment / Plan / Recommendation Clinical Impression  Pt seen for swallow reassessment. RN reported wet vocal quality and pt was made NPO after breakfast this morning. SLP provided trials of nectar thick liquids, by teaspoon initially, which pt tolerated well using compensations that were shown to be effective on MBS (throat clear, swallow 2-3 times). Initially SLP provided verbal cues but was able to fade support to supervision as pt took cup sips of nectar, with no change in vocal quality or respirations. Pt reported that this nectar thick liquid seemed "less thick" than what he had this morning (tray was no longer present in room). As pt had significant residue with puree, question whether difficulty this morning was due to slightly thicker texture. Pt reported difficulty with magic cup and a liquid that seemed "grainy." Education provided to pt and family that if thickener added to liquids, texture may become too thick over time. D/w RN; requested to remix liquids if they appear too thick or if they have been sitting out for some time.   Also targeted therapeutic exercise. Pt demo'd good understanding of exercise procedure with rare min A required initially for procedure with CTAR (chin tuck against resistance) and Respironics expiratory trainer (set to 20 cm). Pt completed 20 reps of each; terminated session early as pt had to use the restroom, but he assured SLP he would complete additional 10 reps of each exercise afterwards.  Recommend full liquids, NECTAR consistency; ensure liquids are not too thick. Will follow closely for tolerance and therapeutic exercise to improve swallow function.     HPI HPI: 84 y.o. male with PMH of HTN, HLD, DM, PVD presented with acute onset difficulty  speech and swallowing, left-sided ataxia.  CT head showed stable atrophy and white matter disease, likely within normal limits for age. MRI acute left medullary infarct      SLP Plan  Continue with current plan of care;Other (Comment)       Recommendations  Diet recommendations: Nectar-thick liquid Liquids provided via: Teaspoon;Cup Medication Administration: (crushed with nectar) Supervision: Patient able to self feed;Intermittent supervision to cue for compensatory strategies Compensations: Minimize environmental distractions;Small sips/bites;Clear throat after each swallow;Slow rate;Multiple dry swallows after each bite/sip Postural Changes and/or Swallow Maneuvers: Seated upright 90 degrees                Oral Care Recommendations: Oral care QID;Oral care prior to ice chip/H20 Follow up Recommendations: Skilled Nursing facility SLP Visit Diagnosis: Dysphagia, oropharyngeal phase (R13.12) Plan: Continue with current plan of care;Other (Comment)       GO              Deneise Lever, [place], [place] Speech-Language Pathologist Acute Rehabilitation Services Pager: [phone] Office: [phone]   Aliene Altes 04/06/2018, 12:46 PM

## 2018-04-06 NOTE — Progress Notes (Signed)
Physical Therapy Treatment Patient Details Name: Nathan Cook MRN: [id number] DOB: [birth year] Today's Date: 04/06/2018    History of Present Illness Pt is a 84 y.o. male with PMH of HTN, HLD, DM, PVD presented with acute onset difficulty speech and swallowing, left-sided ataxia.  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  PMH: DM; HTN; SAH (2009); TIA.     PT Comments    Pt making good progress. Continue to recommend further rehab at [facility].   Follow Up Recommendations  SNF;Supervision/Assistance - 24 hour;Other (comment)(Pt from [facility] and wants SNF there)     Equipment Recommendations  Rolling walker with 5" wheels;3in1 (PT);Wheelchair (measurements PT);Wheelchair cushion (measurements PT)    Recommendations for Other Services       Precautions / Restrictions Precautions Precautions: Fall Restrictions Weight Bearing Restrictions: No    Mobility  Bed Mobility Overal bed mobility: Needs Assistance Bed Mobility: Supine to Sit     Supine to sit: Max assist;+2 for safety/equipment     General bed mobility comments: Assist to bring legs off of bed and elevate trunk into sitting. Verbal/tactile cues for technque.   Transfers Overall transfer level: Needs assistance Equipment used: Rolling walker (2 wheeled) Transfers: Sit to/from [facility] Sit to Stand: Min assist;+2 physical assistance Stand pivot transfers: Mod assist;+2 safety/equipment       General transfer comment: Assist to bring hips up and for balance. Pt with rt lateral lean. Assist to reposition rt hand on walker   Ambulation/Gait Ambulation/Gait assistance: Mod assist;+2 safety/equipment Gait Distance (Feet): 18 Feet Assistive device: Rolling walker (2 wheeled) Gait Pattern/deviations: Step-through pattern;Decreased step length - right;Decreased step length - left;Ataxic Gait velocity: decr Gait velocity interpretation: <1.31 ft/sec,  indicative of household ambulator General Gait Details: Assist for balance and support. Pt with incr rt lateral lean as fatigued. Close chair follow for safety. Assist to steer walker. Rt hand remained on walker   Stairs             Wheelchair Mobility    Modified Rankin (Stroke Patients Only) Modified Rankin (Stroke Patients Only) Pre-Morbid Rankin Score: No symptoms Modified Rankin: Moderately severe disability     Balance Overall balance assessment: Needs assistance Sitting-balance support: Feet supported;Bilateral upper extremity supported Sitting balance-Leahy Scale: Poor Sitting balance - Comments: UE support. Occasional rt lean    Standing balance support: Bilateral upper extremity supported Standing balance-Leahy Scale: Poor Standing balance comment: walker and min assist for static standing. Mod assist for any movement                            Cognition Arousal/Alertness: Awake/alert Behavior During Therapy: Impulsive Overall Cognitive Status: Impaired/Different from baseline Area of Impairment: Attention;Following commands;Safety/judgement;Problem solving                   Current Attention Level: Sustained   Following Commands: Follows one step commands inconsistently;Follows one step commands with increased time Safety/Judgement: Decreased awareness of deficits;Decreased awareness of safety Awareness: Emergent Problem Solving: Slow processing;Difficulty sequencing;Requires verbal cues;Requires tactile cues        Exercises      General Comments General comments (skin integrity, edema, etc.): wife and granddaughter present      Pertinent Vitals/Pain Pain Assessment: No/denies pain    Home Living  Prior Function            PT Goals (current goals can now be found in the care plan section) Progress towards PT goals: Progressing toward goals;Goals met and updated - see care plan     Frequency    Min 3X/week      PT Plan Current plan remains appropriate    Co-evaluation              AM-PAC PT "6 Clicks" Daily Activity  Outcome Measure  Difficulty turning over in bed (including adjusting bedclothes, sheets and blankets)?: Unable Difficulty moving from lying on back to sitting on the side of the bed? : Unable Difficulty sitting down on and standing up from a chair with arms (e.g., wheelchair, bedside commode, etc,.)?: Unable Help needed moving to and from a bed to chair (including a wheelchair)?: A Lot Help needed walking in hospital room?: A Lot Help needed climbing 3-5 steps with a railing? : Total 6 Click Score: 8    End of Session Equipment Utilized During Treatment: Gait belt Activity Tolerance: Patient tolerated treatment well Patient left: in chair;with call bell/phone within reach;with chair alarm set Nurse Communication: Mobility status PT Visit Diagnosis: Muscle weakness (generalized) (M62.81);Difficulty in walking, not elsewhere classified (R26.2);Hemiplegia and hemiparesis Hemiplegia - Right/Left: Right Hemiplegia - dominant/non-dominant: Dominant Hemiplegia - caused by: Cerebral infarction;Nontraumatic SAH     Time: 5797-2820 PT Time Calculation (min) (ACUTE ONLY): 21 min  Charges:  $Gait Training: 8-22 mins                     [place] Pager [phone] Office [place] 04/06/2018, 4:38 PM

## 2018-04-07 DIAGNOSIS — I480 Paroxysmal atrial fibrillation: Secondary | ICD-10-CM

## 2018-04-07 LAB — GLUCOSE, CAPILLARY
GLUCOSE-CAPILLARY: 171 mg/dL — AB (ref 70–99)
GLUCOSE-CAPILLARY: 146 mg/dL — AB (ref 70–99)
GLUCOSE-CAPILLARY: 150 mg/dL — AB (ref 70–99)
Glucose-Capillary: 123 mg/dL — ABNORMAL HIGH (ref 70–99)
Glucose-Capillary: 145 mg/dL — ABNORMAL HIGH (ref 70–99)
Glucose-Capillary: 167 mg/dL — ABNORMAL HIGH (ref 70–99)
Glucose-Capillary: 192 mg/dL — ABNORMAL HIGH (ref 70–99)

## 2018-04-07 NOTE — Progress Notes (Signed)
STROKE TEAM PROGRESS NOTE   SUBJECTIVE (INTERVAL HISTORY) Wife and granddaughter are at bedside. Lying in the bed. Had nectar thick liquid diet last night and this am, but RN reported cough and gurgling sound in throat, concerning for aspiration. Therefore, NPO restarted and speech was called for further evaluation. PAF was confirmed by cardiology, and will consider anticoagulation if no further procedure planned and his SAH resolves.   OBJECTIVE Temp:  [97.8 F (36.6 C)-98.9 F (37.2 C)] 98.1 F (36.7 C) (10/13 0412) Pulse Rate:  [85-101] 98 (10/13 0412) Cardiac Rhythm: Heart block;Bundle branch block (10/12 1900) Resp:  [20-25] 24 (10/13 0412) BP: (147-163)/(67-85) 147/80 (10/13 0412) SpO2:  [88 %-94 %] 93 % (10/13 0412)   Vitals:   04/06/18 2004 04/06/18 2359 04/07/18 0412 04/07/18 0800  BP: (!) 163/85 (!) 158/75 (!) 147/80 (!) 149/85  Pulse: (!) 101 100 98 98  Resp: (!) 21 (!) 25 (!) 24 20  Temp: 98.9 F (37.2 C) 98.1 F (36.7 C) 98.1 F (36.7 C) (!) 97.5 F (36.4 C)  TempSrc: Oral Oral Oral Oral  SpO2: 92% (!) 88% 93% 94%  Weight:      Height:         Vitals:   04/06/18 2004 04/06/18 2359 04/07/18 0412 04/07/18 0800  BP: (!) 163/85 (!) 158/75 (!) 147/80 (!) 149/85  Pulse: (!) 101 100 98 98  Resp: (!) 21 (!) 25 (!) 24 20  Temp: 98.9 F (37.2 C) 98.1 F (36.7 C) 98.1 F (36.7 C) (!) 97.5 F (36.4 C)  TempSrc: Oral Oral Oral Oral  SpO2: 92% (!) 88% 93% 94%  Weight:      Height:         Recent Labs  Lab 04/06/18 1550 04/06/18 2007 04/06/18 2355 04/07/18 0408 04/07/18 0747  GLUCAP 236* 151* 145* 167* 171*   Recent Labs  Lab 04/01/18 1432 04/01/18 1438 04/02/18 0259 04/03/18 0304 04/04/18 0426 04/06/18 0348  NA 135 137 135 136 138 141  K 4.2 4.2 4.0 3.7 3.8 3.7  CL 103 104 103 105 105 110  CO2 22  --  22 22 18* 24  GLUCOSE 161* 162* 205* 148* 156* 141*  BUN 19 20 24* 28* 28* 24*  CREATININE 1.51* 1.50* 1.34* 1.36* 1.30* 1.10  CALCIUM 9.4  --   8.8* 8.6* 8.4* 8.4*   Recent Labs  Lab 04/01/18 1432  AST 25  ALT 19  ALKPHOS 57  BILITOT 0.9  PROT 6.6  ALBUMIN 3.7   Recent Labs  Lab 04/01/18 1432 04/01/18 1438 04/02/18 0259 04/03/18 0304 04/04/18 0426 04/06/18 0348  WBC 8.9  --  8.2 24.0* 15.2* 12.4*  NEUTROABS 6.3  --   --   --   --   --   HGB 12.7* 12.2* 11.8* 12.0* 13.3 13.5  HCT 38.4* 36.0* 35.3* 36.2* 40.0 40.5  MCV 104.9*  --  101.1* 100.3* 101.8* 102.0*  PLT 317  --  287 365 338 336   No results for input(s): CKTOTAL, CKMB, CKMBINDEX, TROPONINI in the last 168 hours. No results for input(s): LABPROT, INR in the last 72 hours. No results for input(s): COLORURINE, LABSPEC, [place], GLUCOSEU, HGBUR, BILIRUBINUR, KETONESUR, PROTEINUR, UROBILINOGEN, NITRITE, LEUKOCYTESUR in the last 72 hours.  Invalid input(s): APPERANCEUR   IMAGING Ct Angio Head W Or Wo Contrast  Result Date: 04/01/2018 CLINICAL DATA:  Stroke EXAM: CT ANGIOGRAPHY HEAD AND NECK TECHNIQUE: Multidetector CT imaging of the head and neck was performed using the standard protocol during  bolus administration of intravenous contrast. Multiplanar CT image reconstructions and MIPs were obtained to evaluate the vascular anatomy. Carotid stenosis measurements (when applicable) are obtained utilizing NASCET criteria, using the distal internal carotid diameter as the denominator. CONTRAST:  13mL ISOVUE-370 IOPAMIDOL (ISOVUE-370) INJECTION 76% COMPARISON:  CT head 04/01/2018 FINDINGS: CTA NECK FINDINGS Aortic arch: Atherosclerotic calcification aortic arch and proximal great vessels. Proximal great vessels widely patent. Right carotid system: Atherosclerotic calcification right carotid bulb without significant stenosis. Left carotid system: Atherosclerotic calcification left carotid bulb. 25% diameter stenosis internal carotid artery on the left. Remainder of the left internal carotid artery widely patent. Vertebral arteries: Right vertebral artery dominant. Moderate  stenosis origin of right vertebral artery. Remainder of the right vertebral artery is widely patent to the basilar with mild atherosclerotic disease distally. Moderate stenosis at the origin of the left vertebral artery. Left vertebral artery diffusely diseased and occludes at the C1 level and does not continue to the basilar. Left PICA not visualized. Skeleton: Cervical spondylosis without acute skeletal abnormality. Other neck: Negative Upper chest: Mild bronchiectasis and scarring in the right upper lobe. 5 mm right upper lobe nodule axial image 158 Review of the MIP images confirms the above findings CTA HEAD FINDINGS Anterior circulation: Atherosclerotic calcification in the cavernous carotid bilaterally causing mild stenosis bilaterally. Anterior and middle cerebral arteries patent bilaterally without stenosis or large vessel occlusion Posterior circulation: Right vertebral artery patent to the basilar with mild calcific stenosis V4 segment. Right PICA is widely patent. Occlusion of left vertebral artery at the C1 level without reconstitution. Left PICA not visualized. Basilar widely patent. Mild atherosclerotic disease in the basilar artery. Superior cerebellar and posterior cerebral arteries patent bilaterally without stenosis. Venous sinuses: Patent Anatomic variants: None Delayed phase: Not performed Review of the MIP images confirms the above findings IMPRESSION: Mild atherosclerotic disease in the carotid bifurcation bilaterally. Atherosclerotic calcification and mild stenosis in the cavernous carotid bilaterally. Moderate stenosis at the origin of the vertebral artery bilaterally. Occlusion distal left vertebral artery at the C1 level. Left PICA occluded. This could be acute or chronic. Correlate with neurologic examination and MRI. These results were called by telephone at the time of interpretation on 04/01/2018 at 3:18 pm to Dr. Rosalin Hawking , who verbally acknowledged these results. Electronically  Signed   By: Franchot Gallo M.D.   On: 04/01/2018 15:18   Ct Head Wo Contrast  Result Date: 04/06/2018 CLINICAL DATA:  Stroke left medulla. TPA given. Follow-up subarachnoid hemorrhage. EXAM: CT HEAD WITHOUT CONTRAST TECHNIQUE: Contiguous axial images were obtained from the base of the skull through the vertex without intravenous contrast. COMPARISON:  CT head 04/04/2018, MRI 04/02/2018, CT 04/01/2018 FINDINGS: Brain: Subcentimeter hemorrhage in the left posterior temporal lobe is unchanged appears parenchymal. Adjacent small volume subarachnoid hemorrhage also unchanged. No new areas of hemorrhage Acute infarct left medulla best seen on MRI however hypodensity is seen in this area on CT. Moderate atrophy and moderate chronic microvascular ischemic changes. No mass lesion. Vascular: Atherosclerotic calcification. 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 Negative orbit. Other: None IMPRESSION: Stable small volume hemorrhage in the left posterior temporal lobe and adjacent subarachnoid space. No new area of hemorrhage Acute infarct left medulla best seen on MRI. Electronically Signed   By: Franchot Gallo M.D.   On: 04/06/2018 10:22   Ct Head Wo Contrast  Result Date: 04/04/2018 CLINICAL DATA:  Subarachnoid hemorrhage EXAM: CT HEAD WITHOUT CONTRAST TECHNIQUE: Contiguous  axial images were obtained from the base of the skull through the vertex without intravenous contrast. COMPARISON:  Head CT 04/01/2018, 04/02/2018 Brain MRI 04/02/2018 FINDINGS: Brain: Small volume left temporal subarachnoid hemorrhage has increased slightly from the prior study. The medial aspect is slightly more rounded in contour and could be intraparenchymal petechial hemorrhage. No contralateral hemorrhage or discrete new site of hemorrhage. There is no midline shift or mass effect. There is periventricular hypoattenuation compatible with chronic  microvascular disease. Vascular: There is atherosclerotic calcification of the internal carotid arteries at the skull base. Skull: Normal Sinuses/Orbits: Paranasal sinuses are clear.  The orbits are normal. Other: None. IMPRESSION: Slightly increased volume of left temporal lobe subarachnoid hemorrhage, remote from the site of ischemia demonstrated on the MRI of 04/02/2018. There may be a small amount of superimposed intraparenchymal petechial hemorrhage. Heidelberg classification 3c: Subarachnoid hemorrhage, remote from area of infarcted brain tissue. Electronically Signed   By: Ulyses Jarred M.D.   On: 04/04/2018 06:59   Ct Head Wo Contrast  Result Date: 04/02/2018 CLINICAL DATA:  Stroke follow-up, 24 hours post tPA EXAM: CT HEAD WITHOUT CONTRAST TECHNIQUE: Contiguous axial images were obtained from the base of the skull through the vertex without intravenous contrast. COMPARISON:  Yesterday FINDINGS: Brain: Small volume subarachnoid hemorrhage within the left temporoparietal sulcus, in retrospect stable from prior. No visible infarct, mass, hydrocephalus, or collection. Vascular: Atherosclerotic calcification Skull: No acute or aggressive finding Sinuses/Orbits: Chronic left maxillary sinusitis. Critical Value/emergent results were called by telephone at the time of interpretation on 04/02/2018 at 1:57 pm to Dr. Rosalin Hawking , who verbally acknowledged these results. IMPRESSION: 1. Small volume subarachnoid hemorrhage along the left temporoparietal convexity, in retrospect stable from yesterday. 2. No visible acute infarct. Electronically Signed   By: Monte Fantasia M.D.   On: 04/02/2018 14:03   Ct Angio Neck W And/or Wo Contrast  Result Date: 04/01/2018 CLINICAL DATA:  Stroke EXAM: CT ANGIOGRAPHY HEAD AND NECK TECHNIQUE: Multidetector CT imaging of the head and neck was performed using the standard protocol during bolus administration of intravenous contrast. Multiplanar CT image reconstructions and MIPs  were obtained to evaluate the vascular anatomy. Carotid stenosis measurements (when applicable) are obtained utilizing NASCET criteria, using the distal internal carotid diameter as the denominator. CONTRAST:  62mL ISOVUE-370 IOPAMIDOL (ISOVUE-370) INJECTION 76% COMPARISON:  CT head 04/01/2018 FINDINGS: CTA NECK FINDINGS Aortic arch: Atherosclerotic calcification aortic arch and proximal great vessels. Proximal great vessels widely patent. Right carotid system: Atherosclerotic calcification right carotid bulb without significant stenosis. Left carotid system: Atherosclerotic calcification left carotid bulb. 25% diameter stenosis internal carotid artery on the left. Remainder of the left internal carotid artery widely patent. Vertebral arteries: Right vertebral artery dominant. Moderate stenosis origin of right vertebral artery. Remainder of the right vertebral artery is widely patent to the basilar with mild atherosclerotic disease distally. Moderate stenosis at the origin of the left vertebral artery. Left vertebral artery diffusely diseased and occludes at the C1 level and does not continue to the basilar. Left PICA not visualized. Skeleton: Cervical spondylosis without acute skeletal abnormality. Other neck: Negative Upper chest: Mild bronchiectasis and scarring in the right upper lobe. 5 mm right upper lobe nodule axial image 158 Review of the MIP images confirms the above findings CTA HEAD FINDINGS Anterior circulation: Atherosclerotic calcification in the cavernous carotid bilaterally causing mild stenosis bilaterally. Anterior and middle cerebral arteries patent bilaterally without stenosis or large vessel occlusion Posterior circulation: Right vertebral artery patent to the basilar with mild  calcific stenosis V4 segment. Right PICA is widely patent. Occlusion of left vertebral artery at the C1 level without reconstitution. Left PICA not visualized. Basilar widely patent. Mild atherosclerotic disease in the  basilar artery. Superior cerebellar and posterior cerebral arteries patent bilaterally without stenosis. Venous sinuses: Patent Anatomic variants: None Delayed phase: Not performed Review of the MIP images confirms the above findings IMPRESSION: Mild atherosclerotic disease in the carotid bifurcation bilaterally. Atherosclerotic calcification and mild stenosis in the cavernous carotid bilaterally. Moderate stenosis at the origin of the vertebral artery bilaterally. Occlusion distal left vertebral artery at the C1 level. Left PICA occluded. This could be acute or chronic. Correlate with neurologic examination and MRI. These results were called by telephone at the time of interpretation on 04/01/2018 at 3:18 pm to Dr. Rosalin Hawking , who verbally acknowledged these results. Electronically Signed   By: Franchot Gallo M.D.   On: 04/01/2018 15:18   Mr Brain Wo Contrast  Result Date: 04/02/2018 CLINICAL DATA:  Stroke. EXAM: MRI HEAD WITHOUT CONTRAST TECHNIQUE: Multiplanar, multiecho pulse sequences of the brain and surrounding structures were obtained without intravenous contrast. COMPARISON:  CT head 04/01/2018. CT head 04/02/2018. CTA head 04/01/2018. FINDINGS: Brain: Acute LEFT inferior medullary infarct, restricted diffusion, corresponding low ADC, no blood products, LEFT PICA territory, correlates with LEFT vertebral and PICA occlusion on CTA. Elsewhere no acute stroke, mass lesion, or hydrocephalus. Atrophy with chronic microvascular ischemic change. Acute subarachnoid blood in the LEFT posterior temporal region correlates with the CT abnormalities, and appears increased from the original CT, when technique differences are considered. No draining vein. There may be a tiny focus of parenchymal hemorrhage of an acute nature, see series 12, image 24. Etiology unknown, query occult trauma, with shearing injury. Vascular: Flow voids are maintained in the carotid, basilar, and RIGHT vertebral arteries. LEFT vertebral  demonstrates lack of flow void, consistent with acute occlusion. Skull and upper cervical spine: Normal marrow signal. Sinuses/Orbits: Chronic sinus disease. Hypoplastic LEFT maxillary sinus. Negative orbits. Other: Normal mastoids. IMPRESSION: Acute LEFT medullary brainstem infarct correlating with recent LEFT vertebral and PICA occlusion. LEFT posterior temporal subarachnoid hemorrhage, uncertain etiology, appears to be increased volume from original CT, perhaps tPA related. Atrophy with chronic microvascular ischemic change. Electronically Signed   By: Staci Righter M.D.   On: 04/02/2018 14:34          Ct Head Code Stroke Wo Contrast  Result Date: 04/01/2018 CLINICAL DATA:  Code stroke. Code stroke. Difficulty swallowing. Left-sided weakness. Last seen normal 1 hour ago. EXAM: CT HEAD WITHOUT CONTRAST TECHNIQUE: Contiguous axial images were obtained from the base of the skull through the vertex without intravenous contrast. COMPARISON:  CT head without contrast 10/04/2017 FINDINGS: Brain: Atrophy and white matter changes are within normal limits for age. Basal ganglia are intact. No acute infarct, hemorrhage, or mass lesion is present. Insular cortex is within normal limits. No acute or focal cortical abnormality is present ventricles are proportionate to the degree of atrophy. No significant extra-axial fluid collection is present. The brainstem and cerebellum are normal. Vascular: Atherosclerotic calcifications are present the cavernous internal carotid arteries bilaterally and at the dural margin of both vertebral arteries. There is no hyperdense vessel. Skull: Calvarium is intact. No focal lytic or blastic lesions are present. Sinuses/Orbits: Chronic left maxillary sinus disease present. The paranasal sinuses and mastoid air cells are otherwise clear. Globes and orbits are within normal limits bilaterally. ASPECTS [facility] Stroke Program Early CT Score) - Ganglionic level infarction (caudate, lentiform  nuclei, internal capsule, insula, M1-M3 cortex): 7/7 - Supraganglionic infarction (M4-M6 cortex): 3/3 Total score (0-10 with 10 being normal): 10/10 IMPRESSION: 1. Stable atrophy and white matter disease, likely within normal limits for age. 2. No acute intracranial abnormality. 3. Atherosclerosis. 4. ASPECTS is 10/10 The above was relayed via text pager to Dr. Erlinda Hong on 04/01/2018 at 14:49 . Electronically Signed   By: San Morelle M.D.   On: 04/01/2018 14:51     PHYSICAL EXAM General - Well nourished, well developed, no acute distress Cardiovascular - Regular rate and rhythm. Skin - bruise b/l medial arm/forearm, L>R.  Mental Status -  Level of arousal and orientation to month, year, upcoming holiday, self, age and person were intact. Language including expression, naming, repetition, comprehension was assessed and found intact. Dysarthric. Hoarse improved.   Cranial Nerves II - XII - II - Visual field intact OU. III, IV, VI - Extraocular movements intact. V - Facial sensation intact bilaterally. VII - Facial movement intact bilaterally. VIII - Hearing & vestibular intact bilaterally, few beats horizontal nystagmus on left gaze, direction to the left. X - Palate elevates symmetrically, improved hypophonia and hoarseness. XI - Chin turning & shoulder shrug intact bilaterally. XII - Tongue protrusion intact.  Motor Strength - The patient's strength was normal in all extremities right UE 3/5.  Bulk was normal and fasciculations were absent.   Motor Tone - Muscle tone was assessed at the neck and appendages and was normal.  Reflexes - The patient's reflexes were symmetrical in all extremities and he had no pathological reflexes.  Sensory - Light touch, temperature/pinprick were assessed and were symmetrical.    Coordination - The patient had dysmetria on the right hand FTN proportional to the weakness.  Tremor was absent.  Gait and Station - deferred.   ASSESSMENT/PLAN Nathan Cook is a 84 y.o. male with history of HTN, HLD, DM, PVD admitted for acute onset difficulty speech and swallowing, left-sided ataxia.  TPA given.    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 Once SAH resolves, and no further plan of procedure, consider start NOACs for stroke prevention  Ongoing aggressive stroke risk factor management  Therapy recommendations:  SNF, 3N1  Disposition:  Pending - from [facility] independent living. They will have SNF bed for him at d/c.  Left posterior temporal small SAH, asymptomatic  With retrospective review of CT head, it was present on the initial CT head  No change of small size on repeat CT and MRI  Seems not changed after tPA  Hold off anticoagulation at this time  Repeat CT head 04/06/18 - stable SAH  Once SAH resolves, and no further plan of procedure, consider start NOACs for stroke prevention  PAF  PAF episode on tele confirmed by cardiology  Appreciate card recs  Rate controlled so far  Once [facility] resolves, and no further plan of procedure, consider start NOACs for stroke prevention  Leukocytosis  WBC 8.2->24.0->15.2->12.4  Etiology unclear, aspiration ??  afebrile  UA no UTI  CXR minimal L basilar atx  Delirium, resolved  Due to ICU and hospitalization most likely  Off restraints   Consider seroquel when po access if needed  Diabetes  HgbA1c 7.6 goal < 7.0  Uncontrolled  Mild hyperglycemia, improving  CBG  monitoring  SSI  close PCP follow up  Hypertension . Stable  BP goal < 160  Long term goal normotensive  Hyperlipidemia  Home meds:  zocor 10   LDL 80, goal < 70  Will increase to zocor 20 once po access  Continue statin at discharge  Dysphagia  Symptoms of swallow difficulty and hoarseness  passed swallow on nectar thick liquid diet -> coughing -> continue NPO   Speech continues to follow  On IVF to 65cc/h  Pt initially stated he would not want a tube - at second discussion, may consider a temporary tube. Will discuss further as needed  Other Stroke Risk Factors  Advanced age  PVD  Other Active Problems  CKD III - Cre 1.51->1.34->1.36-<1.30 -> 1.10  Leukocytosis improving. Afebrile. Won't repeat for now.   Hospital day # 6       To contact Stroke Continuity provider, please refer to [email or web address] After hours, contact General Neurology

## 2018-04-07 NOTE — Progress Notes (Signed)
  Speech Language Pathology Treatment: Dysphagia  Patient Details Name: Nathan Cook MRN: [id number] DOB: 1921-11-01 Today's Date: 04/07/2018 Time: 1120-1150 SLP Time Calculation (min) (ACUTE ONLY): 30 min  Assessment / Plan / Recommendation Clinical Impression  Pt seen for skilled ST treatment targeting dysphagia. Granddaughter reports pt completed EMST and swallowing exercises several times yesterday.  Pt sitting upright in chair, taking sips of nectar thick liquid as SLP entered room. Pt required cues initially for smaller bolus size, and to use compensations of throat clear, multiple swallows. Wet coughing noted x2, with larger size bolus and when pt tilted into neck extension in what appeared to be an effort to aid oral transit. SLP provided teachback re: compensatory maneuvers and positioning during swallowing, and there were no further signs of airway compromise with nectar thick liquids. Dietary staff brought full liquid lunch tray (nectar) during session which contained pudding thick liquids as well. SLP provided skilled observation with upgraded trials of pudding and cues for use of compensations. Pt had delayed coughing, and SLP suspects he is continuing to struggle with pharyngeal clearance of thicker boluses. SLP used MBS video for teachback with pt and family re: need to continue with nectar liquids only at this time, pending repeat instrumental to determine safety for solid textures. Diet orders modified to send nectar liquids only; while full liquid diet includes grits, pudding, do not recommend pt consume these items until repeat instrumental assessment can be performed. Pt reported fatigue and requested to go to bed. SLP encouraged him to complete EMST and CTAR exercises as instructed on his handout; granddaughter demo'd understanding of procedure and can assist if needed.      HPI HPI: 84 y.o. male with PMH of HTN, HLD, DM, PVD presented with acute onset difficulty speech and  swallowing, left-sided ataxia.  CT head showed stable atrophy and white matter disease, likely within normal limits for age. MRI acute left medullary infarct      SLP Plan  Continue with current plan of care;Other (Comment)       Recommendations  Diet recommendations: Nectar-thick liquid Liquids provided via: Teaspoon;Cup Medication Administration: Other (Comment)(crush with nectar) Supervision: Patient able to self feed;Intermittent supervision to cue for compensatory strategies Compensations: Minimize environmental distractions;Small sips/bites;Clear throat after each swallow;Slow rate;Multiple dry swallows after each bite/sip Postural Changes and/or Swallow Maneuvers: Seated upright 90 degrees                Oral Care Recommendations: Oral care QID;Oral care prior to ice chip/H20 Follow up Recommendations: Skilled Nursing facility SLP Visit Diagnosis: Dysphagia, oropharyngeal phase (R13.12) Plan: Continue with current plan of care;Other (Comment)       GO              Deneise Lever, [place], [place] Speech-Language Pathologist Acute Rehabilitation Services Pager: [phone] Office: [phone]   Aliene Altes 04/07/2018, 1:25 PM

## 2018-04-08 DIAGNOSIS — I6300 Cerebral infarction due to thrombosis of unspecified precerebral artery: Secondary | ICD-10-CM

## 2018-04-08 LAB — GLUCOSE, CAPILLARY
GLUCOSE-CAPILLARY: 189 mg/dL — AB (ref 70–99)
Glucose-Capillary: 127 mg/dL — ABNORMAL HIGH (ref 70–99)
Glucose-Capillary: 134 mg/dL — ABNORMAL HIGH (ref 70–99)
Glucose-Capillary: 203 mg/dL — ABNORMAL HIGH (ref 70–99)
Glucose-Capillary: 153 mg/dL — ABNORMAL HIGH (ref 70–99)

## 2018-04-08 MED ORDER — METOPROLOL TARTRATE 25 MG PO TABS
25.0000 mg | ORAL_TABLET | Freq: Two times a day (BID) | ORAL | Status: DC
  Administered 2018-04-08 – 2018-04-09 (×2): 25 mg via ORAL
  Filled 2018-04-08 (×2): qty 1

## 2018-04-08 MED ORDER — FAMOTIDINE 20 MG PO TABS
20.0000 mg | ORAL_TABLET | Freq: Two times a day (BID) | ORAL | Status: DC
  Administered 2018-04-08 – 2018-04-09 (×2): 20 mg via ORAL
  Filled 2018-04-08 (×2): qty 1

## 2018-04-08 MED ORDER — SIMVASTATIN 20 MG PO TABS
20.0000 mg | ORAL_TABLET | Freq: Every day | ORAL | Status: DC
  Administered 2018-04-08: 20 mg via ORAL
  Filled 2018-04-08: qty 1

## 2018-04-08 MED ORDER — METOPROLOL TARTRATE 5 MG/5ML IV SOLN
2.5000 mg | Freq: Once | INTRAVENOUS | Status: AC
  Administered 2018-04-08: 2.5 mg via INTRAVENOUS
  Filled 2018-04-08: qty 5

## 2018-04-08 NOTE — Progress Notes (Signed)
STROKE TEAM PROGRESS NOTE   SUBJECTIVE (INTERVAL HISTORY) No family member at bedside. Lying in the bed. speech therapy has reassessed and recommend continued dysphagia 1. Diet with nectar thick liquid. PAF was confirmed by cardiology, and will consider anticoagulation if no further procedure planned and his SAH resolves.   OBJECTIVE Temp:  [97.8 F (36.6 C)-98.7 F (37.1 C)] 97.9 F (36.6 C) (10/14 1300) Pulse Rate:  [78-99] 83 (10/14 1300) Cardiac Rhythm: Normal sinus rhythm (10/14 0800) Resp:  [17-28] 18 (10/14 1300) BP: (130-156)/(63-87) 155/69 (10/14 1300) SpO2:  [93 %-99 %] 93 % (10/14 1300)   Vitals:   04/07/18 2258 04/08/18 0314 04/08/18 0755 04/08/18 1300  BP: 130/82 (!) 149/87 (!) 153/71 (!) 155/69  Pulse: 81 99 85 83  Resp: 19 (!) 21 18 18   Temp: 97.8 F (36.6 C) 98.7 F (37.1 C) 98.2 F (36.8 C) 97.9 F (36.6 C)  TempSrc: Oral Oral Oral Oral  SpO2: 96% 95% 94% 93%  Weight:      Height:         Vitals:   04/07/18 2258 04/08/18 0314 04/08/18 0755 04/08/18 1300  BP: 130/82 (!) 149/87 (!) 153/71 (!) 155/69  Pulse: 81 99 85 83  Resp: 19 (!) 21 18 18   Temp: 97.8 F (36.6 C) 98.7 F (37.1 C) 98.2 F (36.8 C) 97.9 F (36.6 C)  TempSrc: Oral Oral Oral Oral  SpO2: 96% 95% 94% 93%  Weight:      Height:         Recent Labs  Lab 04/07/18 1940 04/07/18 2254 04/08/18 0311 04/08/18 0754 04/08/18 1215  GLUCAP 123* 150* 127* 134* 203*   Recent Labs  Lab 04/02/18 0259 04/03/18 0304 04/04/18 0426 04/06/18 0348  NA 135 136 138 141  K 4.0 3.7 3.8 3.7  CL 103 105 105 110  CO2 22 22 18* 24  GLUCOSE 205* 148* 156* 141*  BUN 24* 28* 28* 24*  CREATININE 1.34* 1.36* 1.30* 1.10  CALCIUM 8.8* 8.6* 8.4* 8.4*   No results for input(s): AST, ALT, ALKPHOS, BILITOT, PROT, ALBUMIN in the last 168 hours. Recent Labs  Lab 04/02/18 0259 04/03/18 0304 04/04/18 0426 04/06/18 0348  WBC 8.2 24.0* 15.2* 12.4*  HGB 11.8* 12.0* 13.3 13.5  HCT 35.3* 36.2* 40.0 40.5   MCV 101.1* 100.3* 101.8* 102.0*  PLT 287 365 338 336   No results for input(s): CKTOTAL, CKMB, CKMBINDEX, TROPONINI in the last 168 hours. No results for input(s): LABPROT, INR in the last 72 hours. No results for input(s): COLORURINE, LABSPEC, [place], GLUCOSEU, HGBUR, BILIRUBINUR, KETONESUR, PROTEINUR, UROBILINOGEN, NITRITE, LEUKOCYTESUR in the last 72 hours.  Invalid input(s): APPERANCEUR   IMAGING Ct Angio Head W Or Wo Contrast  Result Date: 04/01/2018 CLINICAL DATA:  Stroke EXAM: CT ANGIOGRAPHY HEAD AND NECK TECHNIQUE: Multidetector CT imaging of the head and neck was performed using the standard protocol during bolus administration of intravenous contrast. Multiplanar CT image reconstructions and MIPs were obtained to evaluate the vascular anatomy. Carotid stenosis measurements (when applicable) are obtained utilizing NASCET criteria, using the distal internal carotid diameter as the denominator. CONTRAST:  75mL ISOVUE-370 IOPAMIDOL (ISOVUE-370) INJECTION 76% COMPARISON:  CT head 04/01/2018 FINDINGS: CTA NECK FINDINGS Aortic arch: Atherosclerotic calcification aortic arch and proximal great vessels. Proximal great vessels widely patent. Right carotid system: Atherosclerotic calcification right carotid bulb without significant stenosis. Left carotid system: Atherosclerotic calcification left carotid bulb. 25% diameter stenosis internal carotid artery on the left. Remainder of the left internal carotid artery widely  patent. Vertebral arteries: Right vertebral artery dominant. Moderate stenosis origin of right vertebral artery. Remainder of the right vertebral artery is widely patent to the basilar with mild atherosclerotic disease distally. Moderate stenosis at the origin of the left vertebral artery. Left vertebral artery diffusely diseased and occludes at the C1 level and does not continue to the basilar. Left PICA not visualized. Skeleton: Cervical spondylosis without acute skeletal abnormality.  Other neck: Negative Upper chest: Mild bronchiectasis and scarring in the right upper lobe. 5 mm right upper lobe nodule axial image 158 Review of the MIP images confirms the above findings CTA HEAD FINDINGS Anterior circulation: Atherosclerotic calcification in the cavernous carotid bilaterally causing mild stenosis bilaterally. Anterior and middle cerebral arteries patent bilaterally without stenosis or large vessel occlusion Posterior circulation: Right vertebral artery patent to the basilar with mild calcific stenosis V4 segment. Right PICA is widely patent. Occlusion of left vertebral artery at the C1 level without reconstitution. Left PICA not visualized. Basilar widely patent. Mild atherosclerotic disease in the basilar artery. Superior cerebellar and posterior cerebral arteries patent bilaterally without stenosis. Venous sinuses: Patent Anatomic variants: None Delayed phase: Not performed Review of the MIP images confirms the above findings IMPRESSION: Mild atherosclerotic disease in the carotid bifurcation bilaterally. Atherosclerotic calcification and mild stenosis in the cavernous carotid bilaterally. Moderate stenosis at the origin of the vertebral artery bilaterally. Occlusion distal left vertebral artery at the C1 level. Left PICA occluded. This could be acute or chronic. Correlate with neurologic examination and MRI. These results were called by telephone at the time of interpretation on 04/01/2018 at 3:18 pm to Dr. Rosalin Hawking , who verbally acknowledged these results. Electronically Signed   By: Franchot Gallo M.D.   On: 04/01/2018 15:18   Ct Head Wo Contrast  Result Date: 04/06/2018 CLINICAL DATA:  Stroke left medulla. TPA given. Follow-up subarachnoid hemorrhage. EXAM: CT HEAD WITHOUT CONTRAST TECHNIQUE: Contiguous axial images were obtained from the base of the skull through the vertex without intravenous contrast. COMPARISON:  CT head 04/04/2018, MRI 04/02/2018, CT 04/01/2018 FINDINGS: Brain:  Subcentimeter hemorrhage in the left posterior temporal lobe is unchanged appears parenchymal. Adjacent small volume subarachnoid hemorrhage also unchanged. No new areas of hemorrhage Acute infarct left medulla best seen on MRI however hypodensity is seen in this area on CT. Moderate atrophy and moderate chronic microvascular ischemic changes. No mass lesion. Vascular: Atherosclerotic calcification. 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 Negative orbit. Other: None IMPRESSION: Stable small volume hemorrhage in the left posterior temporal lobe and adjacent subarachnoid space. No new area of hemorrhage Acute infarct left medulla best seen on MRI. Electronically Signed   By: Franchot Gallo M.D.   On: 04/06/2018 10:22   Ct Head Wo Contrast  Result Date: 04/04/2018 CLINICAL DATA:  Subarachnoid hemorrhage EXAM: CT HEAD WITHOUT CONTRAST TECHNIQUE: Contiguous axial images were obtained from the base of the skull through the vertex without intravenous contrast. COMPARISON:  Head CT 04/01/2018, 04/02/2018 Brain MRI 04/02/2018 FINDINGS: Brain: Small volume left temporal subarachnoid hemorrhage has increased slightly from the prior study. The medial aspect is slightly more rounded in contour and could be intraparenchymal petechial hemorrhage. No contralateral hemorrhage or discrete new site of hemorrhage. There is no midline shift or mass effect. There is periventricular hypoattenuation compatible with chronic microvascular disease. Vascular: There is atherosclerotic calcification of the internal carotid arteries at the skull base. Skull: Normal Sinuses/Orbits: Paranasal sinuses are clear.  The orbits  are normal. Other: None. IMPRESSION: Slightly increased volume of left temporal lobe subarachnoid hemorrhage, remote from the site of ischemia demonstrated on the MRI of 04/02/2018. There may be a small amount of superimposed  intraparenchymal petechial hemorrhage. Heidelberg classification 3c: Subarachnoid hemorrhage, remote from area of infarcted brain tissue. Electronically Signed   By: Ulyses Jarred M.D.   On: 04/04/2018 06:59   Ct Head Wo Contrast  Result Date: 04/02/2018 CLINICAL DATA:  Stroke follow-up, 24 hours post tPA EXAM: CT HEAD WITHOUT CONTRAST TECHNIQUE: Contiguous axial images were obtained from the base of the skull through the vertex without intravenous contrast. COMPARISON:  Yesterday FINDINGS: Brain: Small volume subarachnoid hemorrhage within the left temporoparietal sulcus, in retrospect stable from prior. No visible infarct, mass, hydrocephalus, or collection. Vascular: Atherosclerotic calcification Skull: No acute or aggressive finding Sinuses/Orbits: Chronic left maxillary sinusitis. Critical Value/emergent results were called by telephone at the time of interpretation on 04/02/2018 at 1:57 pm to Dr. Rosalin Hawking , who verbally acknowledged these results. IMPRESSION: 1. Small volume subarachnoid hemorrhage along the left temporoparietal convexity, in retrospect stable from yesterday. 2. No visible acute infarct. Electronically Signed   By: Monte Fantasia M.D.   On: 04/02/2018 14:03   Ct Angio Neck W And/or Wo Contrast  Result Date: 04/01/2018 CLINICAL DATA:  Stroke EXAM: CT ANGIOGRAPHY HEAD AND NECK TECHNIQUE: Multidetector CT imaging of the head and neck was performed using the standard protocol during bolus administration of intravenous contrast. Multiplanar CT image reconstructions and MIPs were obtained to evaluate the vascular anatomy. Carotid stenosis measurements (when applicable) are obtained utilizing NASCET criteria, using the distal internal carotid diameter as the denominator. CONTRAST:  71mL ISOVUE-370 IOPAMIDOL (ISOVUE-370) INJECTION 76% COMPARISON:  CT head 04/01/2018 FINDINGS: CTA NECK FINDINGS Aortic arch: Atherosclerotic calcification aortic arch and proximal great vessels. Proximal great  vessels widely patent. Right carotid system: Atherosclerotic calcification right carotid bulb without significant stenosis. Left carotid system: Atherosclerotic calcification left carotid bulb. 25% diameter stenosis internal carotid artery on the left. Remainder of the left internal carotid artery widely patent. Vertebral arteries: Right vertebral artery dominant. Moderate stenosis origin of right vertebral artery. Remainder of the right vertebral artery is widely patent to the basilar with mild atherosclerotic disease distally. Moderate stenosis at the origin of the left vertebral artery. Left vertebral artery diffusely diseased and occludes at the C1 level and does not continue to the basilar. Left PICA not visualized. Skeleton: Cervical spondylosis without acute skeletal abnormality. Other neck: Negative Upper chest: Mild bronchiectasis and scarring in the right upper lobe. 5 mm right upper lobe nodule axial image 158 Review of the MIP images confirms the above findings CTA HEAD FINDINGS Anterior circulation: Atherosclerotic calcification in the cavernous carotid bilaterally causing mild stenosis bilaterally. Anterior and middle cerebral arteries patent bilaterally without stenosis or large vessel occlusion Posterior circulation: Right vertebral artery patent to the basilar with mild calcific stenosis V4 segment. Right PICA is widely patent. Occlusion of left vertebral artery at the C1 level without reconstitution. Left PICA not visualized. Basilar widely patent. Mild atherosclerotic disease in the basilar artery. Superior cerebellar and posterior cerebral arteries patent bilaterally without stenosis. Venous sinuses: Patent Anatomic variants: None Delayed phase: Not performed Review of the MIP images confirms the above findings IMPRESSION: Mild atherosclerotic disease in the carotid bifurcation bilaterally. Atherosclerotic calcification and mild stenosis in the cavernous carotid bilaterally. Moderate stenosis at  the origin of the vertebral artery bilaterally. Occlusion distal left vertebral artery at the C1 level. Left PICA occluded.  This could be acute or chronic. Correlate with neurologic examination and MRI. These results were called by telephone at the time of interpretation on 04/01/2018 at 3:18 pm to Dr. Rosalin Hawking , who verbally acknowledged these results. Electronically Signed   By: Franchot Gallo M.D.   On: 04/01/2018 15:18   Mr Brain Wo Contrast  Result Date: 04/02/2018 CLINICAL DATA:  Stroke. EXAM: MRI HEAD WITHOUT CONTRAST TECHNIQUE: Multiplanar, multiecho pulse sequences of the brain and surrounding structures were obtained without intravenous contrast. COMPARISON:  CT head 04/01/2018. CT head 04/02/2018. CTA head 04/01/2018. FINDINGS: Brain: Acute LEFT inferior medullary infarct, restricted diffusion, corresponding low ADC, no blood products, LEFT PICA territory, correlates with LEFT vertebral and PICA occlusion on CTA. Elsewhere no acute stroke, mass lesion, or hydrocephalus. Atrophy with chronic microvascular ischemic change. Acute subarachnoid blood in the LEFT posterior temporal region correlates with the CT abnormalities, and appears increased from the original CT, when technique differences are considered. No draining vein. There may be a tiny focus of parenchymal hemorrhage of an acute nature, see series 12, image 24. Etiology unknown, query occult trauma, with shearing injury. Vascular: Flow voids are maintained in the carotid, basilar, and RIGHT vertebral arteries. LEFT vertebral demonstrates lack of flow void, consistent with acute occlusion. Skull and upper cervical spine: Normal marrow signal. Sinuses/Orbits: Chronic sinus disease. Hypoplastic LEFT maxillary sinus. Negative orbits. Other: Normal mastoids. IMPRESSION: Acute LEFT medullary brainstem infarct correlating with recent LEFT vertebral and PICA occlusion. LEFT posterior temporal subarachnoid hemorrhage, uncertain etiology, appears to be  increased volume from original CT, perhaps tPA related. Atrophy with chronic microvascular ischemic change. Electronically Signed   By: Staci Righter M.D.   On: 04/02/2018 14:34          Ct Head Code Stroke Wo Contrast  Result Date: 04/01/2018 CLINICAL DATA:  Code stroke. Code stroke. Difficulty swallowing. Left-sided weakness. Last seen normal 1 hour ago. EXAM: CT HEAD WITHOUT CONTRAST TECHNIQUE: Contiguous axial images were obtained from the base of the skull through the vertex without intravenous contrast. COMPARISON:  CT head without contrast 10/04/2017 FINDINGS: Brain: Atrophy and white matter changes are within normal limits for age. Basal ganglia are intact. No acute infarct, hemorrhage, or mass lesion is present. Insular cortex is within normal limits. No acute or focal cortical abnormality is present ventricles are proportionate to the degree of atrophy. No significant extra-axial fluid collection is present. The brainstem and cerebellum are normal. Vascular: Atherosclerotic calcifications are present the cavernous internal carotid arteries bilaterally and at the dural margin of both vertebral arteries. There is no hyperdense vessel. Skull: Calvarium is intact. No focal lytic or blastic lesions are present. Sinuses/Orbits: Chronic left maxillary sinus disease present. The paranasal sinuses and mastoid air cells are otherwise clear. Globes and orbits are within normal limits bilaterally. ASPECTS [facility] Stroke Program Early CT Score) - Ganglionic level infarction (caudate, lentiform nuclei, internal capsule, insula, M1-M3 cortex): 7/7 - Supraganglionic infarction (M4-M6 cortex): 3/3 Total score (0-10 with 10 being normal): 10/10 IMPRESSION: 1. Stable atrophy and white matter disease, likely within normal limits for age. 2. No acute intracranial abnormality. 3. Atherosclerosis. 4. ASPECTS is 10/10 The above was relayed via text pager to Dr. Erlinda Hong on 04/01/2018 at 14:49 . Electronically Signed   By:  San Morelle M.D.   On: 04/01/2018 14:51     PHYSICAL EXAM General - frail elderly Caucasian male, no acute distress Cardiovascular - Regular rate and rhythm. Skin - bruise b/l medial arm/forearm, L>R.  Mental  Status -  Level of arousal and orientation to month, year, upcoming holiday, self, age and person were intact. Language including expression, naming, repetition, comprehension was assessed and found intact.  Mildly dysarthric speech and slightly hypophonic. Cranial Nerves II - XII - II - Visual field intact OU. III, IV, VI - Extraocular movements intact.slight skew deviation with right eye hypertropic. Subjective horizontal diplopia on lateral gaze left more than right V - Facial sensation intact bilaterally. VII - Facial movement intact bilaterally. VIII - Hearing & vestibular intact bilaterally, few beats horizontal nystagmus on left gaze, direction to the left. X - Palate elevates symmetrically, improved hypophonia and hoarseness. XI - Chin turning & shoulder shrug intact bilaterally. XII - Tongue protrusion intact.  Motor Strength - The patient's strength was normal in all extremities right UE 3/5.  Bulk was normal and fasciculations were absent.   Motor Tone - Muscle tone was assessed at the neck and appendages and was normal.  Reflexes - The patient's reflexes were symmetrical in all extremities and he had no pathological reflexes.  Sensory - Light touch, temperature/pinprick were assessed and were symmetrical.    Coordination - The patient had dysmetria on the right hand FTN proportional to the weakness.  Tremor was absent.  Gait and Station - deferred.   ASSESSMENT/PLAN Nathan Cook is a 84 y.o. male with history of HTN, HLD, DM, PVD admitted for acute onset difficulty speech and swallowing, left-sided ataxia.  TPA given.    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 Once SAH resolves, and no further plan of procedure, consider start NOACs for stroke prevention  Ongoing aggressive stroke risk factor management  Therapy recommendations:  SNF, 3N1  Disposition:  Pending - from [facility] independent living. They will have SNF bed for him at d/c.  Left posterior temporal small SAH, asymptomatic  With retrospective review of CT head, it was present on the initial CT head  No change of small size on repeat CT and MRI  Seems not changed after tPA  Hold off anticoagulation at this time  Repeat CT head 04/06/18 - stable SAH  Once SAH resolves, and no further plan of procedure, consider start NOACs for stroke prevention  PAF  PAF episode on tele confirmed by cardiology  Appreciate card recs  Rate controlled so far  Once [facility] resolves, and no further plan of procedure, consider start NOACs for stroke prevention  Leukocytosis  WBC 8.2->24.0->15.2->12.4  Etiology unclear, aspiration ??   afebrile  UA no UTI  CXR minimal L basilar atx  Delirium, resolved  Due to ICU and hospitalization most likely  Off restraints   Consider seroquel when po access if needed  Diabetes  HgbA1c 7.6 goal < 7.0  Uncontrolled  Mild hyperglycemia, improving  CBG monitoring  SSI  close PCP follow up  Hypertension . Stable  BP goal < 160  Long term goal normotensive  Hyperlipidemia  Home meds:  zocor 10   LDL 80,  goal < 70  Will increase to zocor 20 once po access  Continue statin at discharge  Dysphagia  Symptoms of swallow  difficulty and hoarseness  passed swallow on nectar thick liquid diet -> coughing -> continue NPO   Speech continues to follow  On IVF to 65cc/h  Pt initially stated he would not want a tube - at second discussion, may consider a temporary tube. Will discuss further as needed  Other Stroke Risk Factors  Advanced age  PVD  Other Active Problems  CKD III - Cre 1.51->1.34->1.36-<1.30 -> 1.10  Leukocytosis improving. Afebrile. Won't repeat for now.   Hospital day # 7 I have personally examined this patient, reviewed notes, independently viewed imaging studies, participated in medical decision making and plan of care.ROS completed by me personally and pertinent positives fully documented  I have made any additions or clarifications directly to the above note.  The patient presented with a stroke and without IV tPA but had a small left temporal subarachnoid hemorrhage. Recommend follow-up CT scan of the head tomorrow morning and if the hemorrhage is resolved may consider starting eliquis prior to transfer to skilled nursing facility rehabilitation. No family available at the bedside for discussion. Greater than 50% and during this 25 minute visit was spent on counseling and coordination of care about his stroke, atrial fibrillation and subarachnoid hemorrhage  Antony Contras, MD Medical Director [facility] Pager: [phone] 04/08/2018 2:59 PM       To contact Stroke Continuity provider, please refer to [email or web address] After hours, contact General Neurology

## 2018-04-08 NOTE — Progress Notes (Signed)
Starting metoprolol 25 mg PO BID for tachycardia in the setting of PAF. Also administering one time dose of IV metoprolol 2.5 mg for rapid control.   Electronically signed: Dr. Kerney Elbe

## 2018-04-08 NOTE — Progress Notes (Signed)
Progress Note  Patient Name: Nathan Cook Date of Encounter: 04/08/2018  Primary Cardiologist: Buford Dresser, MD  Subjective   Feels well, no concerns. Awaiting his family's arrival today to visit.  Inpatient Medications    Scheduled Meds: . chlorhexidine  15 mL Mouth Rinse BID  . famotidine  20 mg Oral BID  . heparin injection (subcutaneous)  5,000 Units Subcutaneous Q8H  . insulin aspart  0-9 Units Subcutaneous Q4H  . mouth rinse  15 mL Mouth Rinse q12n4p  . simvastatin  20 mg Oral q1800   Continuous Infusions: . sodium chloride 65 mL/hr at 04/08/18 1200   PRN Meds: acetaminophen **OR** acetaminophen (TYLENOL) oral liquid 160 mg/5 mL **OR** acetaminophen, labetalol, RESOURCE THICKENUP CLEAR   Vital Signs    Vitals:   04/07/18 2258 04/08/18 0314 04/08/18 0755 04/08/18 1300  BP: 130/82 (!) 149/87 (!) 153/71 (!) 155/69  Pulse: 81 99 85 83  Resp: 19 (!) 21 18 18   Temp: 97.8 F (36.6 C) 98.7 F (37.1 C) 98.2 F (36.8 C) 97.9 F (36.6 C)  TempSrc: Oral Oral Oral Oral  SpO2: 96% 95% 94% 93%  Weight:      Height:        Intake/Output Summary (Last 24 hours) at 04/08/2018 1503 Last data filed at 04/08/2018 1200 Gross per 24 hour  Intake 4725.03 ml  Output 850 ml  Net 3875.03 ml   Filed Weights   04/01/18 1400 04/01/18 1630  Weight: 67.2 kg 65.5 kg    ECG   04/01/18 Sinus rhythm Prolonged PR interval Right bundle branch block - Personally Reviewed  Physical Exam   GEN: No acute distress.   Neck: No JVD Cardiac: regular rhythm, normal rate. 2/6 systolic ejection murmur, no rubs. Respiratory: Clear to auscultation bilaterally. GI: Soft, nontender, non-distended  MS: No edema; No deformity. Neuro:  Nonfocal  Psych: Normal affect   Labs    Chemistry Recent Labs  Lab 04/03/18 0304 04/04/18 0426 04/06/18 0348  NA 136 138 141  K 3.7 3.8 3.7  CL 105 105 110  CO2 22 18* 24  GLUCOSE 148* 156* 141*  BUN 28* 28* 24*  CREATININE 1.36*  1.30* 1.10  CALCIUM 8.6* 8.4* 8.4*  GFRNONAA 43* 45* 55*  GFRAA 50* 52* >60  ANIONGAP 9 15 7      Hematology Recent Labs  Lab 04/03/18 0304 04/04/18 0426 04/06/18 0348  WBC 24.0* 15.2* 12.4*  RBC 3.61* 3.93* 3.97*  HGB 12.0* 13.3 13.5  HCT 36.2* 40.0 40.5  MCV 100.3* 101.8* 102.0*  MCH 33.2 33.8 34.0  MCHC 33.1 33.3 33.3  RDW 13.2 13.2 13.0  PLT 365 338 336    Cardiac EnzymesNo results for input(s): TROPONINI in the last 168 hours. No results for input(s): TROPIPOC in the last 168 hours.   BNPNo results for input(s): BNP, PROBNP in the last 168 hours.   DDimer No results for input(s): DDIMER in the last 168 hours.   Radiology    No results found.  Cardiac Studies   Echo 04/02/2018 Study Conclusions - Left ventricle: The cavity size was normal. Wall thickness was normal. Systolic function was normal. The estimated ejection fraction was in the range of 60% to 65%. Wall motion was normal; there were no regional wall motion abnormalities. Doppler parameters are consistent with abnormal left ventricular relaxation (grade 1 diastolic dysfunction). - Aortic valve: Valve mobility was restricted. There was moderate stenosis. There was mild regurgitation. Valve area (VTI): 1.04 cm^2. Valve area (Vmax): 0.82  cm^2. Valve area (Vmean): 0.99 cm^2.  Impressions: - Normal LV systolic function; mild diastolic dysfunction; calcified aortic valve with moderate AS (mean gradient 21 mmHg) and mild AI.  Patient Profile     Nathan Cook is a 84 y.o. male with a hx of hypertension, hyperlipidemia, DM type 2, BPH who is being seen for the evaluation of atrial fibrillation in setting of stroke at the request of Dr. Erlinda Hong.  Assessment & Plan    Active Problems:   Stroke [facility])   Paroxysmal atrial fibrillation ([place])  Plan: -consider addition of metoprolol 12.5 mg BID if needed for rate control when ok by neurology from blood pressure standpoint. Currently rate  controlled, no urgency to initiation. May assist with BP <160.  - hold losartan currently, especially if initiating metoprolol 12.5 mg bid. - anticoagulation initiation per neurology recommendation.  [facility] will sign off.   Medication Recommendations:  Addition of metoprolol cautiously to assist with rate control, may result in hypotension so best advised by neurology for initiation. Hold losartan. Other recommendations (labs, testing, etc):  none Follow up as an outpatient:  Follow up with primary cardiologist 4 weeks.  For questions or updates, please contact [place] Please consult [email or web address] for contact info under      Signed, Elouise Munroe, MD  04/08/2018, 3:03 PM

## 2018-04-08 NOTE — Progress Notes (Signed)
  Speech Language Pathology Treatment: Dysphagia  Patient Details Name: Nathan Cook MRN: [id number] DOB: April 25, 1922 Today's Date: 04/08/2018 Time: 0900-0920 SLP Time Calculation (min) (ACUTE ONLY): 20 min  Assessment / Plan / Recommendation Clinical Impression  SLP arrived at end of am meal which pt unsuccessfully attempted alone, spilling most of his milk. Pt completed CTAR with supervision assist (promised to do RMT later). Needed repositioning, verbal cues to successfully carry out swallow strategies and position his head forward and not posteriorly. After assist, pt tolerated nectar thick liquids in isolation well. Introduced alternating teaspoons of vanilla yogurt with nectar thick liquids with min verbal cues for multiple hard swallows and intermittent throat clearing. Pt consumed 3 oz of yogurt via this method with no wet vocal quality or coughing by the end of session. Pt to attempt yogurt or soft magic cup with meals throughout the day to assess for tolerance. Discussed with RN and called daughter to discuss with her, but no response. If pt can tolerate this soft texture of solid would deem him ready to advance diet and consider d/c to SNF for further SLP f/u.   HPI HPI: 84 y.o. male with PMH of HTN, HLD, DM, PVD presented with acute onset difficulty speech and swallowing, left-sided ataxia.  CT head showed stable atrophy and white matter disease, likely within normal limits for age. MRI acute left medullary infarct      SLP Plan  Continue with current plan of care       Recommendations  Diet recommendations: Dysphagia 1 (puree);Nectar-thick liquid Liquids provided via: Teaspoon;Cup Medication Administration: (crushed in liquid) Supervision: Full supervision/cueing for compensatory strategies Compensations: Minimize environmental distractions;Slow rate;Small sips/bites;Multiple dry swallows after each bite/sip;Follow solids with liquid                Plan: Continue with  current plan of care       GO                Lynell Kussman, Katherene Ponto 04/08/2018, 9:53 AM

## 2018-04-08 NOTE — Progress Notes (Signed)
Physical Therapy Treatment Patient Details Name: Nathan Cook MRN: [id number] DOB: [birth year] Today's Date: 04/08/2018    History of Present Illness Pt is a 84 y.o. male with PMH of HTN, HLD, DM, PVD presented with acute onset difficulty speech and swallowing, left-sided ataxia.  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  PMH: DM; HTN; SAH (2009); TIA.     PT Comments    Patient progressing with transfers as well as with ambulation distance, though still rather ataxic with R lateral lean at times.  Eager and motivated to progress, however.  Agree with SNF rehab in his community.  PT to follow acutely.   Follow Up Recommendations  SNF;Supervision/Assistance - 24 hour;Other (comment)(at [facility])     Equipment Recommendations  Rolling walker with 5" wheels;3in1 (PT);Wheelchair (measurements PT);Wheelchair cushion (measurements PT)    Recommendations for Other Services       Precautions / Restrictions Precautions Precautions: Fall Precaution Comments: R sided incoordination and weakness    Mobility  Bed Mobility Overal bed mobility: Needs Assistance Bed Mobility: Supine to Sit     Supine to sit: Mod assist;HOB elevated     General bed mobility comments: assist to lift trunk upright, pt brought legs off EOB slowly  Transfers Overall transfer level: Needs assistance Equipment used: Rolling walker (2 wheeled) Transfers: Sit to/from Stand Sit to Stand: Mod assist         General transfer comment: able to stand with +1 A x 1 initially mod A +2; cues for hand placement   Ambulation/Gait Ambulation/Gait assistance: Mod assist;+2 safety/equipment Gait Distance (Feet): 35 Feet(&16') Assistive device: Rolling walker (2 wheeled) Gait Pattern/deviations: Step-through pattern;Decreased stride length;Ataxic;Wide base of support     General Gait Details: cues for R foot placement (to keep it wide), assist due to R lateral weight shift/lean, pt stating  "left/right" when placing feet forward; chair following   Stairs             Wheelchair Mobility    Modified Rankin (Stroke Patients Only) Modified Rankin (Stroke Patients Only) Pre-Morbid Rankin Score: No symptoms Modified Rankin: Moderately severe disability     Balance Overall balance assessment: Needs assistance Sitting-balance support: Feet supported;No upper extremity supported;Single extremity supported Sitting balance-Leahy Scale: Fair Sitting balance - Comments: able to sit without UE support   Standing balance support: Bilateral upper extremity supported Standing balance-Leahy Scale: Poor Standing balance comment: walker and min assist for static standing. Mod assist for any movement                            Cognition Arousal/Alertness: Awake/alert Behavior During Therapy: Impulsive Overall Cognitive Status: Impaired/Different from baseline Area of Impairment: Attention;Following commands;Safety/judgement;Problem solving                   Current Attention Level: Sustained   Following Commands: Follows one step commands with increased time Safety/Judgement: Decreased awareness of safety   Problem Solving: Slow processing        Exercises      General Comments        Pertinent Vitals/Pain Faces Pain Scale: No hurt    Home Living                      Prior Function            PT Goals (current goals can now be found in the care plan section)  Progress towards PT goals: Progressing toward goals    Frequency    Min 3X/week      PT Plan Current plan remains appropriate    Co-evaluation              AM-PAC PT "6 Clicks" Daily Activity  Outcome Measure  Difficulty turning over in bed (including adjusting bedclothes, sheets and blankets)?: Unable Difficulty moving from lying on back to sitting on the side of the bed? : Unable Difficulty sitting down on and standing up from a chair with arms (e.g.,  wheelchair, bedside commode, etc,.)?: Unable Help needed moving to and from a bed to chair (including a wheelchair)?: A Lot Help needed walking in hospital room?: A Lot Help needed climbing 3-5 steps with a railing? : Total 6 Click Score: 8    End of Session Equipment Utilized During Treatment: Gait belt Activity Tolerance: Patient tolerated treatment well Patient left: with call bell/phone within reach;in chair;with chair alarm set   PT Visit Diagnosis: Muscle weakness (generalized) (M62.81);Difficulty in walking, not elsewhere classified (R26.2);Hemiplegia and hemiparesis Hemiplegia - Right/Left: Right Hemiplegia - dominant/non-dominant: Dominant Hemiplegia - caused by: Cerebral infarction;Nontraumatic SAH     Time: 8638-1771 PT Time Calculation (min) (ACUTE ONLY): 15 min  Charges:  $Gait Training: 8-22 mins                     Magda Kiel, PT Acute Rehabilitation Services [phone] 04/08/2018    Reginia Naas 04/08/2018, 5:01 PM

## 2018-04-08 NOTE — Care Management Important Message (Signed)
Important Message  Patient Details  Name: Nathan Cook MRN: [id number] Date of Birth: Aug 06, 1921   Medicare Important Message Given:  Yes    Aviance [place] P [name] 04/08/2018, 3:36 PM

## 2018-04-09 ENCOUNTER — Inpatient Hospital Stay (HOSPITAL_COMMUNITY): Payer: Medicare Other

## 2018-04-09 DIAGNOSIS — I6389 Other cerebral infarction: Secondary | ICD-10-CM

## 2018-04-09 LAB — GLUCOSE, CAPILLARY
GLUCOSE-CAPILLARY: 165 mg/dL — AB (ref 70–99)
GLUCOSE-CAPILLARY: 103 mg/dL — AB (ref 70–99)
Glucose-Capillary: 136 mg/dL — ABNORMAL HIGH (ref 70–99)
Glucose-Capillary: 141 mg/dL — ABNORMAL HIGH (ref 70–99)
Glucose-Capillary: 174 mg/dL — ABNORMAL HIGH (ref 70–99)

## 2018-04-09 MED ORDER — RESOURCE THICKENUP CLEAR PO POWD: 1.0000 | ORAL | 2 refills | Status: DC | PRN

## 2018-04-09 MED ORDER — METOPROLOL TARTRATE 25 MG PO TABS: 25.0000 mg | ORAL_TABLET | Freq: Two times a day (BID) | ORAL | Status: DC

## 2018-04-09 MED ORDER — SIMVASTATIN 20 MG PO TABS: 20.0000 mg | ORAL_TABLET | Freq: Every day | ORAL | 0 refills | Status: DC

## 2018-04-09 NOTE — Discharge Summary (Addendum)
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 BPH (benign prostatic hyperplasia) 10/14/2014  . Cervicalgia 01/04/2016  . Contusion of left shoulder 05/27/14  . DM type 2 (diabetes mellitus, type 2) ([place])   . High blood pressure   . History of basal cell cancer 11/21/2005   Right temple  . History of colonic polyps 10/14/2003  . Hyperlipidemia   . Left knee pain 05/27/14  . SAH (subarachnoid hemorrhage) ([place]) 2009   TBI  . TIA (transient ischemic attack) 11/06/2005   Left eye pain. Slurred speech. Diaphoresis. No residual effects.    Past Surgical History:  Procedure Laterality Date  . Achilles tendon repair Left 1998   ruptured tendon  . COLONOSCOPY  1992   Polyp removed  . COLONOSCOPY  1998   normal  . ORIF FEMUR FRACTURE Right 1929  . TONSILLECTOMY      Allergies as of 04/09/2018   No Known Allergies     Medication List    STOP taking these medications   aspirin 325 MG EC tablet   losartan 50 MG tablet Commonly known as:  COZAAR     TAKE these medications   acetaminophen 650 MG CR tablet Commonly known as:  TYLENOL Take 1 tablet (650 mg total) by mouth every 8 (eight) hours as needed for pain.   CALCIUM 600+D3  PO Take 1 tablet by mouth daily.   Cholecalciferol 1000 units tablet Take 1,000 Units by mouth daily.   EYE DROPS OP Place 2 drops into both eyes as needed (FOR DRY EYES).   glucose blood test strip One Touch Ultra Test Strip Sig:Use to test blood sugar twice daily. Dx E11.9   metFORMIN 1000 MG tablet Commonly known as:  GLUCOPHAGE TAKE ONE TABLET BY MOUTH EVERY MORNING TO CONTROL DIABETES What changed:  See the new instructions.   metoprolol tartrate 25 MG tablet Commonly known as:  LOPRESSOR Take 1 tablet (25 mg total) by mouth 2 (two) times daily.   ONE TOUCH ULTRA SYSTEM KIT w/Device Kit Use to test blood sugar twice daily. Dx E11.9   RESOURCE THICKENUP CLEAR Powd Take 120 g by mouth as needed. For nectar thick liquids   simvastatin 20 MG tablet Commonly known as:  ZOCOR Take 1 tablet (20 mg total) by mouth daily at 6 PM. What changed:    medication strength  how much to take  when to take this   UNILET [facility] LANCET Misc Use to test blood sugar twice daily. Dx: E11.9       LABORATORY STUDIES CBC    Component Value Date/Time   WBC 12.4 (H) 04/06/2018 0348   RBC 3.97 (L) 04/06/2018 0348   HGB 13.5 04/06/2018 0348  HCT 40.5 04/06/2018 0348   PLT 336 04/06/2018 0348   MCV 102.0 (H) 04/06/2018 0348   MCH 34.0 04/06/2018 0348   MCHC 33.3 04/06/2018 0348   RDW 13.0 04/06/2018 0348   LYMPHSABS 1.5 04/01/2018 1432   MONOABS 0.8 04/01/2018 1432   EOSABS 0.1 04/01/2018 1432   BASOSABS 0.1 04/01/2018 1432   CMP    Component Value Date/Time   NA 141 04/06/2018 0348   NA 139 12/27/2015   K 3.7 04/06/2018 0348   CL 110 04/06/2018 0348   CO2 24 04/06/2018 0348   GLUCOSE 141 (H) 04/06/2018 0348   BUN 24 (H) 04/06/2018 0348   BUN 21 12/27/2015   CREATININE 1.10 04/06/2018 0348   CREATININE 1.31 (H) 03/13/2018 0000   CALCIUM 8.4 (L) 04/06/2018 0348   PROT 6.6 04/01/2018 1432   ALBUMIN 3.7 04/01/2018 1432   AST 25 04/01/2018 1432   ALT 19  04/01/2018 1432   ALKPHOS 57 04/01/2018 1432   BILITOT 0.9 04/01/2018 1432   GFRNONAA 55 (L) 04/06/2018 0348   GFRNONAA 47 (L) 03/13/2018 0000   GFRAA >60 04/06/2018 0348   GFRAA 54 (L) 03/13/2018 0000   COAGS Lab Results  Component Value Date   INR 1.06 04/01/2018   Lipid Panel    Component Value Date/Time   CHOL 143 04/02/2018 0259   TRIG 29 04/02/2018 0259   HDL 57 04/02/2018 0259   CHOLHDL 2.5 04/02/2018 0259   VLDL 6 04/02/2018 0259   LDLCALC 80 04/02/2018 0259   LDLCALC 78 03/13/2018 0000   HgbA1C  Lab Results  Component Value Date   HGBA1C 7.6 (H) 04/02/2018   Urinalysis    Component Value Date/Time   COLORURINE YELLOW 04/03/2018 0928   APPEARANCEUR CLEAR 04/03/2018 0928   LABSPEC 1.027 04/03/2018 0928   PHURINE 5.0 04/03/2018 0928   GLUCOSEU 50 (A) 04/03/2018 0928   HGBUR MODERATE (A) 04/03/2018 0928   BILIRUBINUR NEGATIVE 04/03/2018 0928   KETONESUR 20 (A) 04/03/2018 0928   PROTEINUR 30 (A) 04/03/2018 0928   NITRITE NEGATIVE 04/03/2018 0928   LEUKOCYTESUR NEGATIVE 04/03/2018 0928   Urine Drug Screen No results found for: LABOPIA, COCAINSCRNUR, LABBENZ, AMPHETMU, THCU, LABBARB  Alcohol Level No results found for: [facility]   SIGNIFICANT DIAGNOSTIC STUDIES Ct Angio Head W Or Wo Contrast  Result Date: 04/01/2018 CLINICAL DATA:  Stroke EXAM: CT ANGIOGRAPHY HEAD AND NECK TECHNIQUE: Multidetector CT imaging of the head and neck was performed using the standard protocol during bolus administration of intravenous contrast. Multiplanar CT image reconstructions and MIPs were obtained to evaluate the vascular anatomy. Carotid stenosis measurements (when applicable) are obtained utilizing NASCET criteria, using the distal internal carotid diameter as the denominator. CONTRAST:  49m ISOVUE-370 IOPAMIDOL (ISOVUE-370) INJECTION 76% COMPARISON:  CT head 04/01/2018 FINDINGS: CTA NECK FINDINGS Aortic arch: Atherosclerotic calcification aortic arch and proximal great vessels.  Proximal great vessels widely patent. Right carotid system: Atherosclerotic calcification right carotid bulb without significant stenosis. Left carotid system: Atherosclerotic calcification left carotid bulb. 25% diameter stenosis internal carotid artery on the left. Remainder of the left internal carotid artery widely patent. Vertebral arteries: Right vertebral artery dominant. Moderate stenosis origin of right vertebral artery. Remainder of the right vertebral artery is widely patent to the basilar with mild atherosclerotic disease distally. Moderate stenosis at the origin of the left vertebral artery. Left vertebral artery diffusely diseased and occludes at the C1 level and does not continue to the basilar. Left PICA not visualized. Skeleton: Cervical spondylosis without acute  skeletal abnormality. Other neck: Negative Upper chest: Mild bronchiectasis and scarring in the right upper lobe. 5 mm right upper lobe nodule axial image 158 Review of the MIP images confirms the above findings CTA HEAD FINDINGS Anterior circulation: Atherosclerotic calcification in the cavernous carotid bilaterally causing mild stenosis bilaterally. Anterior and middle cerebral arteries patent bilaterally without stenosis or large vessel occlusion Posterior circulation: Right vertebral artery patent to the basilar with mild calcific stenosis V4 segment. Right PICA is widely patent. Occlusion of left vertebral artery at the C1 level without reconstitution. Left PICA not visualized. Basilar widely patent. Mild atherosclerotic disease in the basilar artery. Superior cerebellar and posterior cerebral arteries patent bilaterally without stenosis. Venous sinuses: Patent Anatomic variants: None Delayed phase: Not performed Review of the MIP images confirms the above findings IMPRESSION: Mild atherosclerotic disease in the carotid bifurcation bilaterally. Atherosclerotic calcification and mild stenosis in the cavernous carotid bilaterally.  Moderate stenosis at the origin of the vertebral artery bilaterally. Occlusion distal left vertebral artery at the C1 level. Left PICA occluded. This could be acute or chronic. Correlate with neurologic examination and MRI. These results were called by telephone at the time of interpretation on 04/01/2018 at 3:18 pm to Dr. Rosalin Hawking , who verbally acknowledged these results. Electronically Signed   By: Franchot Gallo M.D.   On: 04/01/2018 15:18   Dg Chest 1 View  Result Date: 04/06/2018 CLINICAL DATA:  Pt with increase secretion and coughing after full liquid diet. Lung sounds noted "gurgling". Concern for aspiration. EXAM: CHEST  1 VIEW COMPARISON:  Chest x-ray dated 04/03/2018. FINDINGS: Heart size and mediastinal contours are stable. Lungs are clear. No pleural effusion or pneumothorax seen. No acute or suspicious osseous finding IMPRESSION: No active disease.  No evidence of pneumonia or aspiration. Electronically Signed   By: Franki Cabot M.D.   On: 04/06/2018 11:48   Dg Shoulder Right  Result Date: 04/02/2018 CLINICAL DATA:  Right shoulder pain for several years EXAM: RIGHT SHOULDER - 2+ VIEW COMPARISON:  None. FINDINGS: Degenerative changes of the acromioclavicular joint are seen. No acute fracture or dislocation is noted. The humeral head is high-riding consistent with chronic right-sided cuff injury. The underlying bony thorax is within normal limits. No soft tissue changes are seen. IMPRESSION: Chronic changes without acute abnormality. Electronically Signed   By: Inez Catalina M.D.   On: 04/02/2018 14:17   Ct Head Wo Contrast  Result Date: 04/09/2018 CLINICAL DATA:  84 y/o  M; stroke for follow-up. EXAM: CT HEAD WITHOUT CONTRAST TECHNIQUE: Contiguous axial images were obtained from the base of the skull through the vertex without intravenous contrast. COMPARISON:  04/06/2018 CT head. 04/02/2018 MRI head. FINDINGS: Brain: Stable lucency within left hemi medulla corresponding to infarct on  prior MRI and CT. Interval partial dispersion of small volume of hemorrhage within the left temporal lobe and subarachnoid hemorrhage over the left temporal convexity. No new acute intracranial hemorrhage, stroke, focal mass effect, extra-axial collection, or herniation. Stable background of chronic microvascular ischemic changes and volume loss of the brain. Vascular: Calcific atherosclerosis of carotid siphons and the vertebral arteries. Skull: Normal. Negative for fracture or focal lesion. Sinuses/Orbits: Normal aeration of the mastoid air cells. Chronic left maxillary sinus disease. Additional paranasal sinuses are normally aerated. Orbits are unremarkable. Other: None. IMPRESSION: 1. Stable left medulla subacute infarction. 2. Interval partial dispersion of small volume of hemorrhage within the left temporal lobe and subarachnoid hemorrhage over the left temporal convexity. 3. No new acute intracranial abnormality.  4. Stable background of chronic microvascular ischemic changes and volume loss of the brain. Electronically Signed   By: Kristine Garbe M.D.   On: 04/09/2018 02:15   Ct Head Wo Contrast  Result Date: 04/06/2018 CLINICAL DATA:  Stroke left medulla. TPA given. Follow-up subarachnoid hemorrhage. EXAM: CT HEAD WITHOUT CONTRAST TECHNIQUE: Contiguous axial images were obtained from the base of the skull through the vertex without intravenous contrast. COMPARISON:  CT head 04/04/2018, MRI 04/02/2018, CT 04/01/2018 FINDINGS: Brain: Subcentimeter hemorrhage in the left posterior temporal lobe is unchanged appears parenchymal. Adjacent small volume subarachnoid hemorrhage also unchanged. No new areas of hemorrhage Acute infarct left medulla best seen on MRI however hypodensity is seen in this area on CT. Moderate atrophy and moderate chronic microvascular ischemic changes. No mass lesion. Vascular: Atherosclerotic calcification. 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 Negative orbit. Other: None IMPRESSION: Stable small volume hemorrhage in the left posterior temporal lobe and adjacent subarachnoid space. No new area of hemorrhage Acute infarct left medulla best seen on MRI. Electronically Signed   By: Franchot Gallo M.D.   On: 04/06/2018 10:22   Ct Head Wo Contrast  Result Date: 04/04/2018 CLINICAL DATA:  Subarachnoid hemorrhage EXAM: CT HEAD WITHOUT CONTRAST TECHNIQUE: Contiguous axial images were obtained from the base of the skull through the vertex without intravenous contrast. COMPARISON:  Head CT 04/01/2018, 04/02/2018 Brain MRI 04/02/2018 FINDINGS: Brain: Small volume left temporal subarachnoid hemorrhage has increased slightly from the prior study. The medial aspect is slightly more rounded in contour and could be intraparenchymal petechial hemorrhage. No contralateral hemorrhage or discrete new site of hemorrhage. There is no midline shift or mass effect. There is periventricular hypoattenuation compatible with chronic microvascular disease. Vascular: There is atherosclerotic calcification of the internal carotid arteries at the skull base. Skull: Normal Sinuses/Orbits: Paranasal sinuses are clear.  The orbits are normal. Other: None. IMPRESSION: Slightly increased volume of left temporal lobe subarachnoid hemorrhage, remote from the site of ischemia demonstrated on the MRI of 04/02/2018. There may be a small amount of superimposed intraparenchymal petechial hemorrhage. Heidelberg classification 3c: Subarachnoid hemorrhage, remote from area of infarcted brain tissue. Electronically Signed   By: Ulyses Jarred M.D.   On: 04/04/2018 06:59   Ct Head Wo Contrast  Result Date: 04/02/2018 CLINICAL DATA:  Stroke follow-up, 24 hours post tPA EXAM: CT HEAD WITHOUT CONTRAST TECHNIQUE: Contiguous axial images were obtained from the base of the skull through the vertex without intravenous contrast.  COMPARISON:  Yesterday FINDINGS: Brain: Small volume subarachnoid hemorrhage within the left temporoparietal sulcus, in retrospect stable from prior. No visible infarct, mass, hydrocephalus, or collection. Vascular: Atherosclerotic calcification Skull: No acute or aggressive finding Sinuses/Orbits: Chronic left maxillary sinusitis. Critical Value/emergent results were called by telephone at the time of interpretation on 04/02/2018 at 1:57 pm to Dr. Rosalin Hawking , who verbally acknowledged these results. IMPRESSION: 1. Small volume subarachnoid hemorrhage along the left temporoparietal convexity, in retrospect stable from yesterday. 2. No visible acute infarct. Electronically Signed   By: Monte Fantasia M.D.   On: 04/02/2018 14:03   Ct Angio Neck W And/or Wo Contrast  Result Date: 04/01/2018 CLINICAL DATA:  Stroke EXAM: CT ANGIOGRAPHY HEAD AND NECK TECHNIQUE: Multidetector CT imaging of the head and neck was performed using the standard protocol during bolus administration of intravenous contrast. Multiplanar CT image reconstructions and MIPs were obtained to evaluate the vascular anatomy. Carotid stenosis measurements (when applicable) are  obtained utilizing NASCET criteria, using the distal internal carotid diameter as the denominator. CONTRAST:  21m ISOVUE-370 IOPAMIDOL (ISOVUE-370) INJECTION 76% COMPARISON:  CT head 04/01/2018 FINDINGS: CTA NECK FINDINGS Aortic arch: Atherosclerotic calcification aortic arch and proximal great vessels. Proximal great vessels widely patent. Right carotid system: Atherosclerotic calcification right carotid bulb without significant stenosis. Left carotid system: Atherosclerotic calcification left carotid bulb. 25% diameter stenosis internal carotid artery on the left. Remainder of the left internal carotid artery widely patent. Vertebral arteries: Right vertebral artery dominant. Moderate stenosis origin of right vertebral artery. Remainder of the right vertebral artery is widely  patent to the basilar with mild atherosclerotic disease distally. Moderate stenosis at the origin of the left vertebral artery. Left vertebral artery diffusely diseased and occludes at the C1 level and does not continue to the basilar. Left PICA not visualized. Skeleton: Cervical spondylosis without acute skeletal abnormality. Other neck: Negative Upper chest: Mild bronchiectasis and scarring in the right upper lobe. 5 mm right upper lobe nodule axial image 158 Review of the MIP images confirms the above findings CTA HEAD FINDINGS Anterior circulation: Atherosclerotic calcification in the cavernous carotid bilaterally causing mild stenosis bilaterally. Anterior and middle cerebral arteries patent bilaterally without stenosis or large vessel occlusion Posterior circulation: Right vertebral artery patent to the basilar with mild calcific stenosis V4 segment. Right PICA is widely patent. Occlusion of left vertebral artery at the C1 level without reconstitution. Left PICA not visualized. Basilar widely patent. Mild atherosclerotic disease in the basilar artery. Superior cerebellar and posterior cerebral arteries patent bilaterally without stenosis. Venous sinuses: Patent Anatomic variants: None Delayed phase: Not performed Review of the MIP images confirms the above findings IMPRESSION: Mild atherosclerotic disease in the carotid bifurcation bilaterally. Atherosclerotic calcification and mild stenosis in the cavernous carotid bilaterally. Moderate stenosis at the origin of the vertebral artery bilaterally. Occlusion distal left vertebral artery at the C1 level. Left PICA occluded. This could be acute or chronic. Correlate with neurologic examination and MRI. These results were called by telephone at the time of interpretation on 04/01/2018 at 3:18 pm to Dr. JRosalin Hawking, who verbally acknowledged these results. Electronically Signed   By: CFranchot GalloM.D.   On: 04/01/2018 15:18   Mr Brain Wo Contrast  Result Date:  04/02/2018 CLINICAL DATA:  Stroke. EXAM: MRI HEAD WITHOUT CONTRAST TECHNIQUE: Multiplanar, multiecho pulse sequences of the brain and surrounding structures were obtained without intravenous contrast. COMPARISON:  CT head 04/01/2018. CT head 04/02/2018. CTA head 04/01/2018. FINDINGS: Brain: Acute LEFT inferior medullary infarct, restricted diffusion, corresponding low ADC, no blood products, LEFT PICA territory, correlates with LEFT vertebral and PICA occlusion on CTA. Elsewhere no acute stroke, mass lesion, or hydrocephalus. Atrophy with chronic microvascular ischemic change. Acute subarachnoid blood in the LEFT posterior temporal region correlates with the CT abnormalities, and appears increased from the original CT, when technique differences are considered. No draining vein. There may be a tiny focus of parenchymal hemorrhage of an acute nature, see series 12, image 24. Etiology unknown, query occult trauma, with shearing injury. Vascular: Flow voids are maintained in the carotid, basilar, and RIGHT vertebral arteries. LEFT vertebral demonstrates lack of flow void, consistent with acute occlusion. Skull and upper cervical spine: Normal marrow signal. Sinuses/Orbits: Chronic sinus disease. Hypoplastic LEFT maxillary sinus. Negative orbits. Other: Normal mastoids. IMPRESSION: Acute LEFT medullary brainstem infarct correlating with recent LEFT vertebral and PICA occlusion. LEFT posterior temporal subarachnoid hemorrhage, uncertain etiology, appears to be increased volume from original CT, perhaps tPA related.  Atrophy with chronic microvascular ischemic change. Electronically Signed   By: Staci Righter M.D.   On: 04/02/2018 14:34   Dg Chest Port 1 View  Result Date: 04/03/2018 CLINICAL DATA:  Congestion, weakness. EXAM: PORTABLE CHEST 1 VIEW COMPARISON:  None. FINDINGS: The heart size and mediastinal contours are within normal limits. Atherosclerosis of thoracic aorta is noted. No pneumothorax or pleural  effusion is noted. Right lung is clear. Minimal left basilar subsegmental atelectasis is noted. The visualized skeletal structures are unremarkable. IMPRESSION: Minimal left basilar subsegmental atelectasis. Aortic Atherosclerosis (ICD10-I70.0). Electronically Signed   By: Marijo Conception, M.D.   On: 04/03/2018 12:34   Dg Swallowing Func-speech Pathology  Result Date: 04/05/2018 Objective Swallowing Evaluation: Type of Study: MBS-Modified Barium Swallow Study  Patient Details Name: ROXY FILLER MRN: [id number] Date of Birth: 1921/07/05 Today's Date: 04/05/2018 Time: SLP Start Time (ACUTE ONLY): 1043 -SLP Stop Time (ACUTE ONLY): 1114 SLP Time Calculation (min) (ACUTE ONLY): 31 min Past Medical History: Past Medical History: Diagnosis Date . BPH (benign prostatic hyperplasia) 10/14/2014 . Cervicalgia 01/04/2016 . Contusion of left shoulder 05/27/14 . DM type 2 (diabetes mellitus, type 2) ([place])  . High blood pressure  . History of basal cell cancer 11/21/2005  Right temple . History of colonic polyps 10/14/2003 . Hyperlipidemia  . Left knee pain 05/27/14 . SAH (subarachnoid hemorrhage) ([place]) 2009  TBI . TIA (transient ischemic attack) 11/06/2005  Left eye pain. Slurred speech. Diaphoresis. No residual effects.  Past Surgical History: Past Surgical History: Procedure Laterality Date . Achilles tendon repair Left 1998  ruptured tendon . COLONOSCOPY  1992  Polyp removed . COLONOSCOPY  1998  normal . ORIF FEMUR FRACTURE Right 1929 . TONSILLECTOMY   HPI: 84 y.o. male with PMH of HTN, HLD, DM, PVD presented with acute onset difficulty speech and swallowing, left-sided ataxia.  CT head showed stable atrophy and white matter disease, likely within normal limits for age. MRI acute left medullary infarct  Subjective: alert and pleasant Assessment / Plan / Recommendation CHL IP CLINICAL IMPRESSIONS 04/05/2018 Clinical Impression Pt presents with notable improvement from [facility] 10/8 with continued moderate pharyngeal dysphagia  today characterized by decreased contraction and duration of swallow resulting in moderate pharyngeal residue. Aspiration of thin liquids before, during and after the swallow was sensed and imaging was unclear however suspect clearance of aspirates due to strong reflexive cough. Airway protection sufficient with NTL with moderate residue in the valleculae and pyriforms that was successfully cleared followed by a STRONG throat clear and 2-3 additional dry swallows. AP view reveals mildly increased residue on the L however R head tilt and L head turn were both ineffective in facilitating clearance of residue. Improved UES opening secondary to improved laryngeal mobility from initial study resulting in decreased residue in the pyriforms and improved ability to clear residue. Continued severe residue of puree in the valleculae was unsuccessfully cleared with 3-4 additional swallows and note chin tuck and NTL wash were minimally effective in facilitating clearance of residue. Recommend NECTAR thick liquid diet with continuation of exercise plan and repeat MBS Monday 10/14 given rapid spontaneous improvement. Strategies: following sip of NTL demonstrate throat clear and 2-3 dry swallows.  SLP Visit Diagnosis Dysphagia, oropharyngeal phase (R13.12) Attention and concentration deficit following -- Frontal lobe and executive function deficit following -- Impact on safety and function Moderate aspiration risk   CHL IP TREATMENT RECOMMENDATION 04/05/2018 Treatment Recommendations Therapy as outlined in treatment plan below   Prognosis 04/05/2018 Prognosis for Safe Diet  Advancement Fair Barriers to Reach Goals Severity of deficits Barriers/Prognosis Comment -- CHL IP DIET RECOMMENDATION 04/05/2018 SLP Diet Recommendations Nectar thick liquid Liquid Administration via Straw;Cup Medication Administration Other (Comment) Compensations Minimize environmental distractions;Small sips/bites;Clear throat after each swallow;Slow  rate;Multiple dry swallows after each bite/sip Postural Changes Remain semi-upright after after feeds/meals (Comment);Seated upright at 90 degrees   CHL IP OTHER RECOMMENDATIONS 04/05/2018 Recommended Consults -- Oral Care Recommendations Oral care BID Other Recommendations Order thickener from pharmacy   CHL IP FOLLOW UP RECOMMENDATIONS 04/05/2018 Follow up Recommendations Inpatient Rehab   CHL IP FREQUENCY AND DURATION 04/05/2018 Speech Therapy Frequency (ACUTE ONLY) min 2x/week Treatment Duration 1 week      CHL IP ORAL PHASE 04/05/2018 Oral Phase WFL Oral - Pudding Teaspoon -- Oral - Pudding Cup -- Oral - Honey Teaspoon -- Oral - Honey Cup -- Oral - Nectar Teaspoon -- Oral - Nectar Cup -- Oral - Nectar Straw -- Oral - Thin Teaspoon -- Oral - Thin Cup -- Oral - Thin Straw -- Oral - Puree -- Oral - Mech Soft -- Oral - Regular -- Oral - Multi-Consistency -- Oral - Pill -- Oral Phase - Comment --  CHL IP PHARYNGEAL PHASE 04/05/2018 Pharyngeal Phase Impaired Pharyngeal- Pudding Teaspoon -- Pharyngeal -- Pharyngeal- Pudding Cup -- Pharyngeal -- Pharyngeal- Honey Teaspoon -- Pharyngeal -- Pharyngeal- Honey Cup -- Pharyngeal -- Pharyngeal- Nectar Teaspoon -- Pharyngeal -- Pharyngeal- Nectar Cup -- Pharyngeal -- Pharyngeal- Nectar Straw Penetration/Aspiration during swallow;Pharyngeal residue - valleculae;Pharyngeal residue - pyriform;Reduced pharyngeal peristalsis;Reduced anterior laryngeal mobility;Reduced airway/laryngeal closure;Delayed swallow initiation-pyriform sinuses;Reduced epiglottic inversion Pharyngeal -- Pharyngeal- Thin Teaspoon Penetration/Aspiration during swallow;Pharyngeal residue - valleculae;Pharyngeal residue - pyriform;Reduced pharyngeal peristalsis;Reduced anterior laryngeal mobility;Reduced airway/laryngeal closure;Delayed swallow initiation-pyriform sinuses;Penetration/Aspiration before swallow;Penetration/Apiration after swallow;Reduced epiglottic inversion Pharyngeal Material enters airway,  passes BELOW cords then ejected out Pharyngeal- Thin Cup Penetration/Aspiration during swallow;Pharyngeal residue - valleculae;Pharyngeal residue - pyriform;Reduced pharyngeal peristalsis;Reduced anterior laryngeal mobility;Reduced airway/laryngeal closure;Delayed swallow initiation-pyriform sinuses;Penetration/Aspiration before swallow;Penetration/Apiration after swallow;Reduced epiglottic inversion Pharyngeal Material enters airway, passes BELOW cords then ejected out Pharyngeal- Thin Straw Penetration/Aspiration during swallow;Pharyngeal residue - valleculae;Pharyngeal residue - pyriform;Reduced pharyngeal peristalsis;Reduced anterior laryngeal mobility;Reduced airway/laryngeal closure;Delayed swallow initiation-pyriform sinuses;Reduced epiglottic inversion Pharyngeal Material enters airway, passes BELOW cords then ejected out Pharyngeal- Puree Reduced anterior laryngeal mobility;Reduced airway/laryngeal closure;Pharyngeal residue - valleculae;Pharyngeal residue - pyriform;Reduced epiglottic inversion Pharyngeal -- Pharyngeal- Mechanical Soft -- Pharyngeal -- Pharyngeal- Regular -- Pharyngeal -- Pharyngeal- Multi-consistency -- Pharyngeal -- Pharyngeal- Pill -- Pharyngeal -- Pharyngeal Comment --  CHL IP CERVICAL ESOPHAGEAL PHASE 04/02/2018 Cervical Esophageal Phase (No Data) Pudding Teaspoon -- Pudding Cup -- Honey Teaspoon -- Honey Cup -- Nectar Teaspoon -- Nectar Cup -- Nectar Straw -- Thin Teaspoon -- Thin Cup -- Thin Straw -- Puree -- Mechanical Soft -- Regular -- Multi-consistency -- Pill -- Cervical Esophageal Comment -- Amelia H. Roddie Mc, CCC-SLP Speech Language Pathologist Wende Bushy 04/05/2018, 2:58 PM              Dg Swallowing Func-speech Pathology  Result Date: 04/02/2018 Objective Swallowing Evaluation: Type of Study: MBS-Modified Barium Swallow Study  Patient Details Name: EMMAUS BRANDI MRN: [id number] Date of Birth: [birth year] Today's Date: 04/02/2018 Time: SLP Start Time (ACUTE  ONLY): 4270 -SLP Stop Time (ACUTE ONLY): 1330 SLP Time Calculation (min) (ACUTE ONLY): 35 min Past Medical History: Past Medical History: Diagnosis Date . BPH (benign prostatic hyperplasia) 10/14/2014 . Cervicalgia 01/04/2016 . Contusion of left shoulder 05/27/14 . DM type 2 (diabetes mellitus, type 2) ([place])  .  High blood pressure  . History of basal cell cancer 11/21/2005  Right temple . History of colonic polyps 10/14/2003 . Hyperlipidemia  . Left knee pain 05/27/14 . SAH (subarachnoid hemorrhage) ([place]) 2009  TBI . TIA (transient ischemic attack) 11/06/2005  Left eye pain. Slurred speech. Diaphoresis. No residual effects.  Past Surgical History: Past Surgical History: Procedure Laterality Date . Achilles tendon repair Left 1998  ruptured tendon . COLONOSCOPY  1992  Polyp removed . COLONOSCOPY  1998  normal . ORIF FEMUR FRACTURE Right 1929 . TONSILLECTOMY   HPI: 85 y.o. male with PMH of HTN, HLD, DM, PVD presented with acute onset difficulty speech and swallowing, left-sided ataxia.  CT head showed stable atrophy and white matter disease, likely within normal limits for age. MRI pending.  Subjective: alert, communicative Assessment / Plan / Recommendation CHL IP CLINICAL IMPRESSIONS 04/02/2018 Clinical Impression Pt presents with a severe pharyngeal dysphagia marked by impaired mobility of the larynx and pharynx, leading to incomplete laryngeal vestibular closure (LVC) and poor pharyngeal squeeze.  There is sensed aspiration of liquids before and during the swallow due to incomplete LVC and after the swallow due to spillage of residue from the pyriforms. There is severe vallecular and pyriform residue that pt is unable to clear despite postural adjustments.  UES only partially opens due to compromised elevation of the larynx - this contributes to the degree of pyriform residue.  Pt self-suctioned and worked to expectorate residue after the study.  For now, recommend continuing NPO; consider temporary enteral feeding via  cortrak (spoke with Dr. Erlinda Hong); SLP to address therapeutic exercise.  Allow occasional ice chips after oral care.  SLP Visit Diagnosis Dysphagia, pharyngeal phase (R13.13) Attention and concentration deficit following -- Frontal lobe and executive function deficit following -- Impact on safety and function Severe aspiration risk   CHL IP TREATMENT RECOMMENDATION 04/02/2018 Treatment Recommendations Therapy as outlined in treatment plan below   Prognosis 04/02/2018 Prognosis for Safe Diet Advancement Fair Barriers to Reach Goals -- Barriers/Prognosis Comment -- CHL IP DIET RECOMMENDATION 04/02/2018 SLP Diet Recommendations NPO;Alternative means - temporary Liquid Administration via -- Medication Administration -- Compensations -- Postural Changes --   CHL IP OTHER RECOMMENDATIONS 04/02/2018 Recommended Consults -- Oral Care Recommendations Oral care QID Other Recommendations --   CHL IP FOLLOW UP RECOMMENDATIONS 04/02/2018 Follow up Recommendations Inpatient Rehab   CHL IP FREQUENCY AND DURATION 04/02/2018 Speech Therapy Frequency (ACUTE ONLY) min 3x week Treatment Duration 1 week      CHL IP ORAL PHASE 04/02/2018 Oral Phase Impaired Oral - Pudding Teaspoon -- Oral - Pudding Cup -- Oral - Honey Teaspoon -- Oral - Honey Cup -- Oral - Nectar Teaspoon -- Oral - Nectar Cup -- Oral - Nectar Straw Piecemeal swallowing Oral - Thin Teaspoon -- Oral - Thin Cup -- Oral - Thin Straw Piecemeal swallowing Oral - Puree Piecemeal swallowing Oral - Mech Soft -- Oral - Regular -- Oral - Multi-Consistency -- Oral - Pill -- Oral Phase - Comment --  CHL IP PHARYNGEAL PHASE 04/02/2018 Pharyngeal Phase Impaired Pharyngeal- Pudding Teaspoon -- Pharyngeal -- Pharyngeal- Pudding Cup -- Pharyngeal -- Pharyngeal- Honey Teaspoon -- Pharyngeal -- Pharyngeal- Honey Cup -- Pharyngeal -- Pharyngeal- Nectar Teaspoon Delayed swallow initiation-vallecula;Reduced pharyngeal peristalsis;Reduced epiglottic inversion;Reduced laryngeal elevation;Reduced  airway/laryngeal closure;Penetration/Aspiration during swallow;Penetration/Apiration after swallow;Pharyngeal residue - pyriform;Pharyngeal residue - valleculae;Penetration/Aspiration before swallow Pharyngeal Material enters airway, remains ABOVE vocal cords and not ejected out Pharyngeal- Nectar Cup -- Pharyngeal -- Pharyngeal- Nectar Straw Delayed swallow initiation-vallecula;Reduced laryngeal elevation;Reduced  airway/laryngeal closure;Penetration/Aspiration before swallow;Penetration/Aspiration during swallow;Penetration/Apiration after swallow;Pharyngeal residue - valleculae;Pharyngeal residue - pyriform Pharyngeal Material enters airway, remains ABOVE vocal cords and not ejected out Pharyngeal- Thin Teaspoon Delayed swallow initiation-pyriform sinuses;Reduced pharyngeal peristalsis;Reduced epiglottic inversion;Reduced laryngeal elevation;Reduced airway/laryngeal closure;Penetration/Aspiration before swallow;Trace aspiration;Pharyngeal residue - valleculae;Pharyngeal residue - pyriform Pharyngeal Material enters airway, passes BELOW cords and not ejected out despite cough attempt by patient Pharyngeal- Thin Cup -- Pharyngeal -- Pharyngeal- Thin Straw Delayed swallow initiation-pyriform sinuses;Reduced pharyngeal peristalsis;Reduced epiglottic inversion;Reduced laryngeal elevation;Reduced airway/laryngeal closure;Penetration/Aspiration before swallow;Penetration/Aspiration during swallow;Penetration/Apiration after swallow;Trace aspiration;Pharyngeal residue - valleculae;Pharyngeal residue - pyriform;Compensatory strategies attempted (with notebox) Pharyngeal Material enters airway, passes BELOW cords and not ejected out despite cough attempt by patient Pharyngeal- Puree Delayed swallow initiation-vallecula;Reduced pharyngeal peristalsis;Reduced epiglottic inversion;Reduced laryngeal elevation;Reduced airway/laryngeal closure;Pharyngeal residue - valleculae;Pharyngeal residue - pyriform Pharyngeal -- Pharyngeal-  Mechanical Soft -- Pharyngeal -- Pharyngeal- Regular -- Pharyngeal -- Pharyngeal- Multi-consistency -- Pharyngeal -- Pharyngeal- Pill -- Pharyngeal -- Pharyngeal Comment --  CHL IP CERVICAL ESOPHAGEAL PHASE 04/02/2018 Cervical Esophageal Phase (No Data) Pudding Teaspoon -- Pudding Cup -- Honey Teaspoon -- Honey Cup -- Nectar Teaspoon -- Nectar Cup -- Nectar Straw -- Thin Teaspoon -- Thin Cup -- Thin Straw -- Puree -- Mechanical Soft -- Regular -- Multi-consistency -- Pill -- Cervical Esophageal Comment -- [name] 04/02/2018, 1:50 PM              Ct Head Code Stroke Wo Contrast  Result Date: 04/01/2018 CLINICAL DATA:  Code stroke. Code stroke. Difficulty swallowing. Left-sided weakness. Last seen normal 1 hour ago. EXAM: CT HEAD WITHOUT CONTRAST TECHNIQUE: Contiguous axial images were obtained from the base of the skull through the vertex without intravenous contrast. COMPARISON:  CT head without contrast 10/04/2017 FINDINGS: Brain: Atrophy and white matter changes are within normal limits for age. Basal ganglia are intact. No acute infarct, hemorrhage, or mass lesion is present. Insular cortex is within normal limits. No acute or focal cortical abnormality is present ventricles are proportionate to the degree of atrophy. No significant extra-axial fluid collection is present. The brainstem and cerebellum are normal. Vascular: Atherosclerotic calcifications are present the cavernous internal carotid arteries bilaterally and at the dural margin of both vertebral arteries. There is no hyperdense vessel. Skull: Calvarium is intact. No focal lytic or blastic lesions are present. Sinuses/Orbits: Chronic left maxillary sinus disease present. The paranasal sinuses and mastoid air cells are otherwise clear. Globes and orbits are within normal limits bilaterally. ASPECTS [facility] Stroke Program Early CT Score) - Ganglionic level infarction (caudate, lentiform nuclei, internal capsule, insula, M1-M3 cortex):  7/7 - Supraganglionic infarction (M4-M6 cortex): 3/3 Total score (0-10 with 10 being normal): 10/10 IMPRESSION: 1. Stable atrophy and white matter disease, likely within normal limits for age. 2. No acute intracranial abnormality. 3. Atherosclerosis. 4. ASPECTS is 10/10 The above was relayed via text pager to Dr. Erlinda Hong on 04/01/2018 at 14:49 . Electronically Signed   By: San Morelle M.D.   On: 04/01/2018 14:51    2D Echocardiogram  - Left ventricle: The cavity size was normal. Wall thickness was normal. Systolic function was normal. The estimated ejection fraction was in the range of 60% to 65%. Wall motion was normal; there were no regional wall motion abnormalities. Doppler parameters are consistent with abnormal left ventricular relaxation (grade 1 diastolic dysfunction). - Aortic valve: Valve mobility was restricted. There was moderate stenosis. There was mild regurgitation. Valve area (VTI): 1.04 cm^2. Valve area (Vmax): 0.82 cm^2. Valve area (Vmean): 0.99 cm^2. Impressions:  Normal LV systolic function; mild diastolic dysfunction; calcified aortic valve with moderate AS (mean gradient 21 mmHg) and mild AI.     HISTORY OF PRESENT ILLNESS MEKAI WILKINSON a 84 y.o.Caucasianmalewith PMH of HTN, HLD, DM, PVD presented for code stroke.Patient was in kitchen preparing for sandwich, all of a sudden, he started to have difficulty speech, soft voice, difficulty swallowing, with copious secretions spitting out to the ground. He was also diaphoretic with left-sided weakness. EMS was called. On arrival patient showing difficulty swallowing and dysarthria, no significant weakness however leaning towards on the left with mild left ataxia. CBG 145, blood pressure 144/66 and oxygen 97%. Patient was stroke alerted to [facility]. Last seen normal 04/01/2018 at 1:30 PM. IV tPA was given.    HOSPITAL COURSE Mr. HALE CHALFIN is a 84 y.o. male with history of HTN, HLD, DM, PVD admitted for acute  onsetdifficulty speech andswallowing,left-sidedataxia s/p IV TPA.    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 Interval dispersion of small volume hmg L temporal love and SAH L temporal convexity. No new abnormality. Stable Small vessel disease. Atrophy.   2D Echo EF 60-65%  LDL 80  HgbA1c 7.6  aspirin 325 mg daily prior to admission, not treated with antithrombotic in hospital given left posterior temporal small SAH. Hemorrhage now resolving. Will start aspirin 81 mg at time of discharge with plans to start Eliquis (and d/c aspirin) in 1 week if remains neurologically stable  Therapy recommendations:  SNF, 3N1  Disposition:  SNF at [facility] (from [facility] independent living P[facility])  Left posterior temporal small SAH, asymptomatic  With retrospective review of CT head, it was present on the initial CT head  No change of small size on repeat CT and MRI  Seems not changed after tPA  Repeat CT head 04/06/18 - stable SAH  Repeat CT head 04/09/18 - resolving  Ok to add aspirin 81 mg daily.  Start eliquis (and d/c aspirin) in 1 week if remains neurologically stable  PAF w/ RVR  PAF episode on tele confirmed by cardiology  Appreciate card recs  Added metoporol for rate control and d/c'd losartain  Ok to add aspirin 81 mg daily.  Start eliquis (and d/c aspirin) in 1 week after Dc to rehab if remains neurologically stable  Leukocytosis  WBC 8.2->24.0->15.2->12.4  Etiology unclear, aspiration ??   afebrile  UA no UTI  CXR minimal L basilar atx  Delirium, resolved  Due to ICU and hospitalization most likely  Diabetes  HgbA1c 7.6 goal <  7.0  Uncontrolled  Mild hyperglycemia, improving  close PCP follow up  Hypertension  Stable  Losartan d/c'd when metoprolol started for rate control  BP goal normotensive  Hyperlipidemia  Home meds:  zocor 10   LDL 80, goal < 70  increased to zocor 20 to meet goal   Continue statin at discharge  Dysphagia  Symptoms of swallow difficulty and hoarseness  After a few days, passed swallow on nectar thick liquid diet   Speech continues to follow  Other Stroke Risk Factors  Advanced age  PVD  UDS / ETOH level not performed   Hx TIA  Other Active Problems  CKD III - Cre 1.51->1.34->1.36-<1.30 -> 1.10  DISCHARGE EXAM Blood pressure (!) 154/79, pulse 77, temperature 97.7 F (36.5 C), temperature source Oral, resp. rate 20, height '5\' 9"'$  (1.753 m), weight 65.5 kg, SpO2 94 %. General - frail elderly Caucasian male, no acute distress Cardiovascular - Regular rate and rhythm. Skin - bruise b/l medial arm/forearm, L>R.  Mental Status -  Level of arousal and orientation to month, year, upcoming holiday, self, age and person were intact. Language including expression, naming, repetition, comprehension was assessed and found intact.  Mildly dysarthric speech and slightly hypophonic. Cranial Nerves II - Visual field intact OU. III, IV, VI - Extraocular movements intact.slight skew deviation with right eye hypertropic. Subjective horizontal diplopia on lateral gaze left more than right V - Facial sensation intact bilaterally. VII - Facial movement intact bilaterally. VIII - Hearing & vestibular intact bilaterally, few beats horizontal nystagmus on left gaze, direction to the left. X - Palate elevates symmetrically, improved hypophonia and hoarseness. XI - Chin turning & shoulder shrug intact bilaterally. XII - Tongue protrusion intact.  Motor Strength - The patient's strength was normal in all extremities right UE 3/5.  Bulk was normal and fasciculations were  absent.   Motor Tone - Muscle tone was assessed at the neck and appendages and was normal.  Reflexes - The patient's reflexes were symmetrical in all extremities and he had no pathological reflexes.  Sensory - Light touch, temperature/pinprick were assessed and were symmetrical.    Coordination - The patient had dysmetria on the right hand FTN proportional to the weakness.  Tremor was absent.  Gait and Station - deferred.  Discharge Diet   Dysphagia 1 nectar thick liquids  DISCHARGE PLAN  Disposition:  [facility] skilled nursing facility for ongoing PT, OT and ST.   aspirin 81 mg daily for secondary stroke prevention. In 1 week, start eliquis (and d/c aspirin) for secondary stroke prevention.  Ongoing risk factor control by Primary Care Physician at time of discharge  Follow-up Blanchie Serve, MD in 2 weeks.  Follow-up in [facility] in 4 weeks, office to schedule an appointment.   35 minutes were spent preparing discharge.  Burnetta Sabin, MSN, APRN, ANVP-BC, AGPCNP-BC Advanced Practice Stroke Nurse [place] for Schedule & Pager information 04/09/2018 1:03 PM   I have personally examined this patient, reviewed notes, independently viewed imaging studies, participated in medical decision making and plan of care.ROS completed by me personally and pertinent positives fully documented  I have made any additions or clarifications directly to the above note. Agree with note above.   Antony Contras, MD Medical Director [facility] Pager: [phone] 04/09/2018 1:30 PM

## 2018-04-09 NOTE — Progress Notes (Signed)
Discharge to: [place] Anticipated discharge date: 04/09/18 Family notified: Yes, voicemail left for daughter Transportation by: [facility]  Report #: [id number], [place] Room 19  [place] signing off.  Laveda Abbe LCSW [phone]

## 2018-04-09 NOTE — Plan of Care (Signed)
Pt goals adequate for discharge

## 2018-04-09 NOTE — Progress Notes (Signed)
  Speech Language Pathology Treatment: Dysphagia  Patient Details Name: Nathan Cook MRN: [id number] DOB: July 02, 1921 Today's Date: 04/09/2018 Time: 0940-1010 SLP Time Calculation (min) (ACUTE ONLY): 30 min  Assessment / Plan / Recommendation Clinical Impression  Pt has consumed 2-3 oz of puree with the last several meals with successful use of strategies to clear pharyngeal residue. Though dysphagia is still quite significant, pt is able to compensate without immediate respiratory compromise. Given pts despite to avoid permanent feeding tube and success with diet so far, recommend pt advance of a dys 1/soft moist puree diet with nectar thick liquids to encourage PO nutritional support. Pt will need ongoing intensive SLP interventions at this and next level of care. Pt continues to complete his CTAR and RMT exercises accurately with min verbal reminders to initiate.   HPI HPI: 84 y.o. male with PMH of HTN, HLD, DM, PVD presented with acute onset difficulty speech and swallowing, left-sided ataxia.  CT head showed stable atrophy and white matter disease, likely within normal limits for age. MRI acute left medullary infarct      SLP Plan  Continue with current plan of care       Recommendations  Diet recommendations: Dysphagia 1 (puree);Nectar-thick liquid Liquids provided via: Cup;Teaspoon Medication Administration: Crushed with puree Supervision: Patient able to self feed;Intermittent supervision to cue for compensatory strategies Compensations: Slow rate;Small sips/bites;Multiple dry swallows after each bite/sip;Clear throat intermittently                Follow up Recommendations: Skilled Nursing facility SLP Visit Diagnosis: Dysphagia, oropharyngeal phase (R13.12) Plan: Continue with current plan of care       GO                Venisa Frampton, Katherene Ponto 04/09/2018, 11:01 AM

## 2018-04-09 NOTE — Progress Notes (Addendum)
Occupational Therapy Treatment Patient Details Name: Nathan Cook MRN: [id number] DOB: [birth year] Today's Date: 04/09/2018    History of present illness Pt is a 84 y.o. male with PMH of HTN, HLD, DM, PVD presented with acute onset difficulty speech and swallowing, left-sided ataxia.  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  PMH: DM; HTN; SAH (2009); TIA.    OT comments  Patient progressing slowly.  Requires min assist for bed mobility, mod assist for toilet transfers, and setup assist for grooming seated at sink. Continues to require cueing for safety and hand placement. Patient requires cueing for increased functional use of R UE during session.  Fatigued today.  Returned to supine in bed at completion of session.  DC plan remain appropriate. Will continue to follow while admitted.    Follow Up Recommendations  SNF;Supervision/Assistance - 24 hour    Equipment Recommendations  3 in 1 bedside commode    Recommendations for Other Services      Precautions / Restrictions Precautions Precautions: Fall Precaution Comments: R sided incoordination and weakness Restrictions Weight Bearing Restrictions: No       Mobility Bed Mobility Overal bed mobility: Needs Assistance Bed Mobility: Supine to Sit;Sit to Supine     Supine to sit: Min assist;HOB elevated Sit to supine: Min assist;HOB elevated   General bed mobility comments: assist for trunk to EOB, cueing for technique and sequencing; min assist to ascend legs back into bed  Transfers Overall transfer level: Needs assistance Equipment used: Rolling walker (2 wheeled) Transfers: Sit to/from Stand Sit to Stand: Mod assist         General transfer comment: sit to stand from EOB with min assist, mod assist from arm chair after fatigued at sink     Balance Overall balance assessment: Needs assistance Sitting-balance support: Feet supported;No upper extremity supported;Single extremity supported Sitting  balance-Leahy Scale: Fair     Standing balance support: Bilateral upper extremity supported;During functional activity Standing balance-Leahy Scale: Poor Standing balance comment: reliant on B UE support in standing                            ADL either performed or assessed with clinical judgement   ADL Overall ADL's : Needs assistance/impaired     Grooming: Wash/dry hands;Wash/dry face;Set up;Sitting                   Toilet Transfer: Ambulation;RW;Moderate assistance Toilet Transfer Details (indicate cue type and reason): simulated to arm chair, cueing for hand placement and safety; phyiscal assist to ascend          Functional mobility during ADLs: Minimal assistance;Rolling walker General ADL Comments: completed bed mobility, short distance mobility to sink, seated grooming at sink     Vision       Perception     Praxis      Cognition Arousal/Alertness: Awake/alert Behavior During Therapy: Impulsive Overall Cognitive Status: Impaired/Different from baseline Area of Impairment: Attention;Following commands;Safety/judgement;Problem solving                   Current Attention Level: Sustained   Following Commands: Follows one step commands with increased time Safety/Judgement: Decreased awareness of safety Awareness: Emergent Problem Solving: Slow processing;Requires verbal cues          Exercises     Shoulder Instructions       General Comments edema R UE, cueing for increased functional use during  grooming tasks     Pertinent Vitals/ Pain       Pain Assessment: No/denies pain  Home Living                                          Prior Functioning/Environment              Frequency  Min 2X/week        Progress Toward Goals  OT Goals(current goals can now be found in the care plan section)  Progress towards OT goals: Progressing toward goals  Acute Rehab OT Goals Patient Stated Goal: to  get better OT Goal Formulation: With patient/family Time For Goal Achievement: 04/17/18 Potential to Achieve Goals: Good  Plan Discharge plan remains appropriate;Frequency remains appropriate    Co-evaluation                 AM-PAC PT "6 Clicks" Daily Activity     Outcome Measure   Help from another person eating meals?: A Little Help from another person taking care of personal grooming?: A Little Help from another person toileting, which includes using toliet, bedpan, or urinal?: A Lot Help from another person bathing (including washing, rinsing, drying)?: A Lot Help from another person to put on and taking off regular upper body clothing?: A Little Help from another person to put on and taking off regular lower body clothing?: A Lot 6 Click Score: 15    End of Session Equipment Utilized During Treatment: Gait belt  OT Visit Diagnosis: Other abnormalities of gait and mobility (R26.89);Muscle weakness (generalized) (M62.81);Ataxia, unspecified (R27.0);Apraxia (R48.2);Other symptoms and signs involving cognitive function   Activity Tolerance Patient tolerated treatment well   Patient Left with call bell/phone within reach;in bed;with bed alarm set   Nurse Communication Mobility status        Time: 8676-1950 OT Time Calculation (min): 18 min  Charges: OT General Charges $OT Visit: 1 Visit OT Treatments $Self Care/Home Management : 8-22 mins  Delight Stare, [place] Pager [phone] Office [phone]    Delight Stare 04/09/2018, 2:41 PM

## 2018-04-10 ENCOUNTER — Non-Acute Institutional Stay (SKILLED_NURSING_FACILITY): Payer: Medicare Other | Admitting: Family Medicine

## 2018-04-10 ENCOUNTER — Encounter: Payer: Self-pay | Admitting: Family Medicine

## 2018-04-10 DIAGNOSIS — E1122 Type 2 diabetes mellitus with diabetic chronic kidney disease: Secondary | ICD-10-CM

## 2018-04-10 DIAGNOSIS — E785 Hyperlipidemia, unspecified: Secondary | ICD-10-CM

## 2018-04-10 DIAGNOSIS — I739 Peripheral vascular disease, unspecified: Secondary | ICD-10-CM | POA: Diagnosis not present

## 2018-04-10 DIAGNOSIS — I10 Essential (primary) hypertension: Secondary | ICD-10-CM | POA: Diagnosis not present

## 2018-04-10 DIAGNOSIS — I6300 Cerebral infarction due to thrombosis of unspecified precerebral artery: Secondary | ICD-10-CM

## 2018-04-10 DIAGNOSIS — I6389 Other cerebral infarction: Secondary | ICD-10-CM

## 2018-04-10 DIAGNOSIS — N183 Chronic kidney disease, stage 3 unspecified: Secondary | ICD-10-CM

## 2018-04-10 DIAGNOSIS — I480 Paroxysmal atrial fibrillation: Secondary | ICD-10-CM

## 2018-04-10 NOTE — Progress Notes (Signed)
Provider:  Alain Honey, MD Location:  [facility] Room Number: [place] of Service:  SNF (31)  PCP: Blanchie Serve, MD Patient Care Team: Blanchie Serve, MD as PCP - General (Internal Medicine) Buford Dresser, MD as PCP - Cardiology (Cardiology)  Extended Emergency Contact Information Primary Emergency Contact: [place] Address: [address]          [place], [place] [address] [place] Phone: [phone] Relation: Daughter  Code Status: FULL Goals of Care: Advanced Directive information Advanced Directives 04/10/2018  Does Patient Have a Medical Advance Directive? Yes  Type of Paramedic of [place];Living will  Does patient want to make changes to medical advance directive? No - Patient declined  Copy of [place] in Chart? Yes      Chief Complaint  Patient presents with  . New Admit To SNF    Admit to Facility     HPI: Patient is a 84 y.o. male seen today for admission to his home [place] skilled nursing facility.  He was hospitalized October 7 and discharged October 15 for diagnosis as well as brainstem infarct status post IV TPA.  Other active problems include hyperlipidemia, hypertension type 2 diabetes with stage III chronic kidney disease, peripheral arterial disease, and paroxysmal atrial fibrillation. Patient had been in the kitchen preparing a sandwich when he started having difficulty with speech soft voice difficulty swallowing with copious secretions.  He was also diaphoretic with left-sided weakness.  On arrival to the emergency room he showed difficulty swallowing and dysarthria but no significant weakness except he did tend to lean to the left with mild left ataxia stroke code was called and IV TPA was given Scan showed left central medullary infarct.  2D echo showed ejection fraction of 60 to 65%.  He is to be discharged to skilled nursing with recommendations for  PT, OT and speech therapy  Past Medical History:  Diagnosis Date  . BPH (benign prostatic hyperplasia) 10/14/2014  . Cervicalgia 01/04/2016  . Contusion of left shoulder 05/27/14  . DM type 2 (diabetes mellitus, type 2) ([place])   . High blood pressure   . History of basal cell cancer 11/21/2005   Right temple  . History of colonic polyps 10/14/2003  . Hyperlipidemia   . Left knee pain 05/27/14  . SAH (subarachnoid hemorrhage) ([place]) 2009   TBI  . TIA (transient ischemic attack) 11/06/2005   Left eye pain. Slurred speech. Diaphoresis. No residual effects.    Past Surgical History:  Procedure Laterality Date  . Achilles tendon repair Left 1998   ruptured tendon  . COLONOSCOPY  1992   Polyp removed  . COLONOSCOPY  1998   normal  . ORIF FEMUR FRACTURE Right 1929  . TONSILLECTOMY      reports that he quit smoking about 46 years ago. He has never used smokeless tobacco. He reports that he drinks about 1.0 - 2.0 standard drinks of alcohol per week. He reports that he does not use drugs. Social History   Socioeconomic History  . Marital status: Married    Spouse name: Not on file  . Number of children: Not on file  . Years of education: Not on file  . Highest education level: Not on file  Occupational History  . Occupation: retired Copy  . Financial resource strain: Not on file  . Food insecurity:    Worry: Not on file    Inability: Not on  file  . Transportation needs:    Medical: Not on file    Non-medical: Not on file  Tobacco Use  . Smoking status: Former Smoker    Last attempt to quit: 06/26/1971    Years since quitting: 46.8  . Smokeless tobacco: Never Used  . Tobacco comment: 2 1/2 packs a day, until 1972  Substance and Sexual Activity  . Alcohol use: Yes    Alcohol/week: 1.0 - 2.0 standard drinks    Types: 1 - 2 Glasses of wine per week    Comment: Wine nightly  . Drug use: No  . Sexual activity: Not on file  Lifestyle  . Physical activity:     Days per week: Not on file    Minutes per session: Not on file  . Stress: Not on file  Relationships  . Social connections:    Talks on phone: Not on file    Gets together: Not on file    Attends religious service: Not on file    Active member of club or organization: Not on file    Attends meetings of clubs or organizations: Not on file    Relationship status: Not on file  . Intimate partner violence:    Fear of current or ex partner: Not on file    Emotionally abused: Not on file    Physically abused: Not on file    Forced sexual activity: Not on file  Other Topics Concern  . Not on file  Social History Narrative   Patient [facility] at [facility] since [facility]   Caffeine- Coffee   Married- Yes, 1951   [employer] 1943-46   House- Apartment with 2 people   Pets- No   Current/past profession- Press photographer   Exercise- Yes, walking   Living will- Yes   DNR- Yes   POA/HPOA- Yes       Functional Status Survey:    Family History  Problem Relation Age of Onset  . Heart attack Mother   . Heart disease Mother   . Heart disease Father     Health Maintenance  Topic Date Due  . OPHTHALMOLOGY EXAM  07/07/2017  . INFLUENZA VACCINE  04/25/2018 (Originally 01/24/2018)  . FOOT EXAM  06/27/2018  . HEMOGLOBIN A1C  10/02/2018  . URINE MICROALBUMIN  03/14/2019  . TETANUS/TDAP  04/29/2027  . PNA vac Low Risk Adult  Completed    No Known Allergies  Outpatient Encounter Medications as of 04/10/2018  Medication Sig  . acetaminophen (TYLENOL 8 HOUR) 650 MG CR tablet Take 1 tablet (650 mg total) by mouth every 8 (eight) hours as needed for pain.  Marland Kitchen aspirin EC 81 MG tablet Take 81 mg by mouth every morning.  . Blood Glucose Monitoring Suppl (ONE TOUCH ULTRA SYSTEM KIT) w/Device KIT Use to test blood sugar twice daily. Dx E11.9  . Calcium Carb-Cholecalciferol (CALCIUM 600+D3 PO) Take 1 tablet by mouth daily.  . Cholecalciferol 1000 UNITS tablet Take 1,000 Units by mouth daily.  Marland Kitchen glucose  blood test strip One Touch Ultra Test Strip Sig:Use to test blood sugar twice daily. Dx E11.9  . Lancets (UNILET COMFORTOUCH LANCET) MISC Use to test blood sugar twice daily. Dx: E11.9  . losartan (COZAAR) 25 MG tablet Take 25 mg by mouth daily.  . metFORMIN (GLUCOPHAGE) 1000 MG tablet TAKE ONE TABLET BY MOUTH EVERY MORNING TO CONTROL DIABETES  . metoprolol tartrate (LOPRESSOR) 25 MG tablet Take 1 tablet (25 mg total) by mouth 2 (two) times  daily.  . Propylene Glycol-Glycerin 1-0.3 % SOLN Place 2 drops into both eyes as needed.  . simvastatin (ZOCOR) 10 MG tablet Take 10 mg by mouth daily.  Marland Kitchen zinc oxide 20 % ointment Apply 1 application topically as needed for irritation. To buttocks after every incontinent episode and as needed for redness. May keep at bedside.  . [DISCONTINUED] Carboxymethylcellulose Sodium (EYE DROPS OP) Place 2 drops into both eyes as needed (FOR DRY EYES).   . [DISCONTINUED] Maltodextrin-Xanthan Gum (RESOURCE THICKENUP CLEAR) POWD Take 120 g by mouth as needed. For nectar thick liquids  . [DISCONTINUED] simvastatin (ZOCOR) 20 MG tablet Take 1 tablet (20 mg total) by mouth daily at 6 PM.   No facility-administered encounter medications on file as of 04/10/2018.     Review of Systems  Constitutional: Positive for activity change.  HENT: Negative.   Eyes: Negative.   Respiratory: Positive for cough.   Cardiovascular: Negative.   Gastrointestinal: Negative.   Genitourinary: Negative.   Musculoskeletal: Negative.   Neurological: Positive for speech difficulty and weakness.    Vitals:   04/10/18 1323  BP: (!) 152/89  Pulse: 97  Resp: 20  Temp: (!) 97.2 F (36.2 C)  TempSrc: Oral  SpO2: 93%  Weight: 148 lb (67.1 kg)  Height: '6\' 1"'$  (1.854 m)   Body mass index is 19.53 kg/m. Physical Exam  Constitutional: He is oriented to person, place, and time. He appears well-developed and well-nourished.  Patient is alert.  Speech is very soft and difficult to understand.   There is a moderate amount of choking and coughing as he tries to eat soup for lunch  HENT:  Head: Normocephalic.  Mouth/Throat: Oropharynx is clear and moist.  Eyes: Pupils are equal, round, and reactive to light. EOM are normal.  Neck: Normal range of motion.  Cardiovascular: Normal rate, regular rhythm and normal heart sounds.  Pulmonary/Chest: Effort normal and breath sounds normal.  Abdominal: Soft. Bowel sounds are normal.  Musculoskeletal: Normal range of motion. He exhibits no edema.  Neurological: He is alert and oriented to person, place, and time. He exhibits normal muscle tone.  Skin: Skin is warm.  Psychiatric: He has a normal mood and affect. His behavior is normal. Thought content normal.  Nursing note and vitals reviewed.   Labs reviewed: Basic Metabolic Panel: Recent Labs    04/03/18 0304 04/04/18 0426 04/06/18 0348  NA 136 138 141  K 3.7 3.8 3.7  CL 105 105 110  CO2 22 18* 24  GLUCOSE 148* 156* 141*  BUN 28* 28* 24*  CREATININE 1.36* 1.30* 1.10  CALCIUM 8.6* 8.4* 8.4*   Liver Function Tests: Recent Labs    09/10/17 0750 03/13/18 0000 04/01/18 1432  AST '18 24 25  '$ ALT '21 26 19  '$ ALKPHOS  --   --  57  BILITOT 0.7 0.9 0.9  PROT 6.5 6.6 6.6  ALBUMIN  --   --  3.7   No results for input(s): LIPASE, AMYLASE in the last 8760 hours. No results for input(s): AMMONIA in the last 8760 hours. CBC: Recent Labs    04/01/18 1432  04/03/18 0304 04/04/18 0426 04/06/18 0348  WBC 8.9   < > 24.0* 15.2* 12.4*  NEUTROABS 6.3  --   --   --   --   HGB 12.7*   < > 12.0* 13.3 13.5  HCT 38.4*   < > 36.2* 40.0 40.5  MCV 104.9*   < > 100.3* 101.8* 102.0*  PLT 317   < >  365 338 336   < > = values in this interval not displayed.   Cardiac Enzymes: No results for input(s): CKTOTAL, CKMB, CKMBINDEX, TROPONINI in the last 8760 hours. BNP: Invalid input(s): POCBNP Lab Results  Component Value Date   HGBA1C 7.6 (H) 04/02/2018   No results found for: TSH Lab Results   Component Value Date   VITAMINB12 1,226 (H) 09/10/2017   No results found for: FOLATE No results found for: IRON, TIBC, FERRITIN  Imaging and Procedures obtained prior to SNF admission: Ct Angio Head W Or Wo Contrast  Result Date: 04/01/2018 CLINICAL DATA:  Stroke EXAM: CT ANGIOGRAPHY HEAD AND NECK TECHNIQUE: Multidetector CT imaging of the head and neck was performed using the standard protocol during bolus administration of intravenous contrast. Multiplanar CT image reconstructions and MIPs were obtained to evaluate the vascular anatomy. Carotid stenosis measurements (when applicable) are obtained utilizing NASCET criteria, using the distal internal carotid diameter as the denominator. CONTRAST:  30m ISOVUE-370 IOPAMIDOL (ISOVUE-370) INJECTION 76% COMPARISON:  CT head 04/01/2018 FINDINGS: CTA NECK FINDINGS Aortic arch: Atherosclerotic calcification aortic arch and proximal great vessels. Proximal great vessels widely patent. Right carotid system: Atherosclerotic calcification right carotid bulb without significant stenosis. Left carotid system: Atherosclerotic calcification left carotid bulb. 25% diameter stenosis internal carotid artery on the left. Remainder of the left internal carotid artery widely patent. Vertebral arteries: Right vertebral artery dominant. Moderate stenosis origin of right vertebral artery. Remainder of the right vertebral artery is widely patent to the basilar with mild atherosclerotic disease distally. Moderate stenosis at the origin of the left vertebral artery. Left vertebral artery diffusely diseased and occludes at the C1 level and does not continue to the basilar. Left PICA not visualized. Skeleton: Cervical spondylosis without acute skeletal abnormality. Other neck: Negative Upper chest: Mild bronchiectasis and scarring in the right upper lobe. 5 mm right upper lobe nodule axial image 158 Review of the MIP images confirms the above findings CTA HEAD FINDINGS Anterior  circulation: Atherosclerotic calcification in the cavernous carotid bilaterally causing mild stenosis bilaterally. Anterior and middle cerebral arteries patent bilaterally without stenosis or large vessel occlusion Posterior circulation: Right vertebral artery patent to the basilar with mild calcific stenosis V4 segment. Right PICA is widely patent. Occlusion of left vertebral artery at the C1 level without reconstitution. Left PICA not visualized. Basilar widely patent. Mild atherosclerotic disease in the basilar artery. Superior cerebellar and posterior cerebral arteries patent bilaterally without stenosis. Venous sinuses: Patent Anatomic variants: None Delayed phase: Not performed Review of the MIP images confirms the above findings IMPRESSION: Mild atherosclerotic disease in the carotid bifurcation bilaterally. Atherosclerotic calcification and mild stenosis in the cavernous carotid bilaterally. Moderate stenosis at the origin of the vertebral artery bilaterally. Occlusion distal left vertebral artery at the C1 level. Left PICA occluded. This could be acute or chronic. Correlate with neurologic examination and MRI. These results were called by telephone at the time of interpretation on 04/01/2018 at 3:18 pm to Dr. JRosalin Hawking, who verbally acknowledged these results. Electronically Signed   By: CFranchot GalloM.D.   On: 04/01/2018 15:18   Dg Shoulder Right  Result Date: 04/02/2018 CLINICAL DATA:  Right shoulder pain for several years EXAM: RIGHT SHOULDER - 2+ VIEW COMPARISON:  None. FINDINGS: Degenerative changes of the acromioclavicular joint are seen. No acute fracture or dislocation is noted. The humeral head is high-riding consistent with chronic right-sided cuff injury. The underlying bony thorax is within normal limits. No soft tissue changes are seen. IMPRESSION:  Chronic changes without acute abnormality. Electronically Signed   By: Inez Catalina M.D.   On: 04/02/2018 14:17   Ct Head Wo  Contrast  Result Date: 04/02/2018 CLINICAL DATA:  Stroke follow-up, 24 hours post tPA EXAM: CT HEAD WITHOUT CONTRAST TECHNIQUE: Contiguous axial images were obtained from the base of the skull through the vertex without intravenous contrast. COMPARISON:  Yesterday FINDINGS: Brain: Small volume subarachnoid hemorrhage within the left temporoparietal sulcus, in retrospect stable from prior. No visible infarct, mass, hydrocephalus, or collection. Vascular: Atherosclerotic calcification Skull: No acute or aggressive finding Sinuses/Orbits: Chronic left maxillary sinusitis. Critical Value/emergent results were called by telephone at the time of interpretation on 04/02/2018 at 1:57 pm to Dr. Rosalin Hawking , who verbally acknowledged these results. IMPRESSION: 1. Small volume subarachnoid hemorrhage along the left temporoparietal convexity, in retrospect stable from yesterday. 2. No visible acute infarct. Electronically Signed   By: Monte Fantasia M.D.   On: 04/02/2018 14:03   Ct Angio Neck W And/or Wo Contrast  Result Date: 04/01/2018 CLINICAL DATA:  Stroke EXAM: CT ANGIOGRAPHY HEAD AND NECK TECHNIQUE: Multidetector CT imaging of the head and neck was performed using the standard protocol during bolus administration of intravenous contrast. Multiplanar CT image reconstructions and MIPs were obtained to evaluate the vascular anatomy. Carotid stenosis measurements (when applicable) are obtained utilizing NASCET criteria, using the distal internal carotid diameter as the denominator. CONTRAST:  37m ISOVUE-370 IOPAMIDOL (ISOVUE-370) INJECTION 76% COMPARISON:  CT head 04/01/2018 FINDINGS: CTA NECK FINDINGS Aortic arch: Atherosclerotic calcification aortic arch and proximal great vessels. Proximal great vessels widely patent. Right carotid system: Atherosclerotic calcification right carotid bulb without significant stenosis. Left carotid system: Atherosclerotic calcification left carotid bulb. 25% diameter stenosis internal  carotid artery on the left. Remainder of the left internal carotid artery widely patent. Vertebral arteries: Right vertebral artery dominant. Moderate stenosis origin of right vertebral artery. Remainder of the right vertebral artery is widely patent to the basilar with mild atherosclerotic disease distally. Moderate stenosis at the origin of the left vertebral artery. Left vertebral artery diffusely diseased and occludes at the C1 level and does not continue to the basilar. Left PICA not visualized. Skeleton: Cervical spondylosis without acute skeletal abnormality. Other neck: Negative Upper chest: Mild bronchiectasis and scarring in the right upper lobe. 5 mm right upper lobe nodule axial image 158 Review of the MIP images confirms the above findings CTA HEAD FINDINGS Anterior circulation: Atherosclerotic calcification in the cavernous carotid bilaterally causing mild stenosis bilaterally. Anterior and middle cerebral arteries patent bilaterally without stenosis or large vessel occlusion Posterior circulation: Right vertebral artery patent to the basilar with mild calcific stenosis V4 segment. Right PICA is widely patent. Occlusion of left vertebral artery at the C1 level without reconstitution. Left PICA not visualized. Basilar widely patent. Mild atherosclerotic disease in the basilar artery. Superior cerebellar and posterior cerebral arteries patent bilaterally without stenosis. Venous sinuses: Patent Anatomic variants: None Delayed phase: Not performed Review of the MIP images confirms the above findings IMPRESSION: Mild atherosclerotic disease in the carotid bifurcation bilaterally. Atherosclerotic calcification and mild stenosis in the cavernous carotid bilaterally. Moderate stenosis at the origin of the vertebral artery bilaterally. Occlusion distal left vertebral artery at the C1 level. Left PICA occluded. This could be acute or chronic. Correlate with neurologic examination and MRI. These results were  called by telephone at the time of interpretation on 04/01/2018 at 3:18 pm to Dr. JRosalin Hawking, who verbally acknowledged these results. Electronically Signed   By: CJuanda Crumble  Carlis Abbott M.D.   On: 04/01/2018 15:18   Mr Brain Wo Contrast  Result Date: 04/02/2018 CLINICAL DATA:  Stroke. EXAM: MRI HEAD WITHOUT CONTRAST TECHNIQUE: Multiplanar, multiecho pulse sequences of the brain and surrounding structures were obtained without intravenous contrast. COMPARISON:  CT head 04/01/2018. CT head 04/02/2018. CTA head 04/01/2018. FINDINGS: Brain: Acute LEFT inferior medullary infarct, restricted diffusion, corresponding low ADC, no blood products, LEFT PICA territory, correlates with LEFT vertebral and PICA occlusion on CTA. Elsewhere no acute stroke, mass lesion, or hydrocephalus. Atrophy with chronic microvascular ischemic change. Acute subarachnoid blood in the LEFT posterior temporal region correlates with the CT abnormalities, and appears increased from the original CT, when technique differences are considered. No draining vein. There may be a tiny focus of parenchymal hemorrhage of an acute nature, see series 12, image 24. Etiology unknown, query occult trauma, with shearing injury. Vascular: Flow voids are maintained in the carotid, basilar, and RIGHT vertebral arteries. LEFT vertebral demonstrates lack of flow void, consistent with acute occlusion. Skull and upper cervical spine: Normal marrow signal. Sinuses/Orbits: Chronic sinus disease. Hypoplastic LEFT maxillary sinus. Negative orbits. Other: Normal mastoids. IMPRESSION: Acute LEFT medullary brainstem infarct correlating with recent LEFT vertebral and PICA occlusion. LEFT posterior temporal subarachnoid hemorrhage, uncertain etiology, appears to be increased volume from original CT, perhaps tPA related. Atrophy with chronic microvascular ischemic change. Electronically Signed   By: Staci Righter M.D.   On: 04/02/2018 14:34   Dg Swallowing Func-speech  Pathology  Result Date: 04/02/2018 Objective Swallowing Evaluation: Type of Study: MBS-Modified Barium Swallow Study  Patient Details Name: GUSTAVE LINDEMAN MRN: [id number] Date of Birth: [birth year] Today's Date: 04/02/2018 Time: SLP Start Time (ACUTE ONLY): 5366 -SLP Stop Time (ACUTE ONLY): 1330 SLP Time Calculation (min) (ACUTE ONLY): 35 min Past Medical History: Past Medical History: Diagnosis Date . BPH (benign prostatic hyperplasia) 10/14/2014 . Cervicalgia 01/04/2016 . Contusion of left shoulder 05/27/14 . DM type 2 (diabetes mellitus, type 2) ([place])  . High blood pressure  . History of basal cell cancer 11/21/2005  Right temple . History of colonic polyps 10/14/2003 . Hyperlipidemia  . Left knee pain 05/27/14 . SAH (subarachnoid hemorrhage) ([place]) 2009  TBI . TIA (transient ischemic attack) 11/06/2005  Left eye pain. Slurred speech. Diaphoresis. No residual effects.  Past Surgical History: Past Surgical History: Procedure Laterality Date . Achilles tendon repair Left 1998  ruptured tendon . COLONOSCOPY  1992  Polyp removed . COLONOSCOPY  1998  normal . ORIF FEMUR FRACTURE Right 1929 . TONSILLECTOMY   HPI: 84 y.o. male with PMH of HTN, HLD, DM, PVD presented with acute onset difficulty speech and swallowing, left-sided ataxia.  CT head showed stable atrophy and white matter disease, likely within normal limits for age. MRI pending.  Subjective: alert, communicative Assessment / Plan / Recommendation CHL IP CLINICAL IMPRESSIONS 04/02/2018 Clinical Impression Pt presents with a severe pharyngeal dysphagia marked by impaired mobility of the larynx and pharynx, leading to incomplete laryngeal vestibular closure (LVC) and poor pharyngeal squeeze.  There is sensed aspiration of liquids before and during the swallow due to incomplete LVC and after the swallow due to spillage of residue from the pyriforms. There is severe vallecular and pyriform residue that pt is unable to clear despite postural adjustments.  UES only  partially opens due to compromised elevation of the larynx - this contributes to the degree of pyriform residue.  Pt self-suctioned and worked to expectorate residue after the study.  For now, recommend continuing NPO; consider temporary enteral  feeding via cortrak (spoke with Dr. Erlinda Hong); SLP to address therapeutic exercise.  Allow occasional ice chips after oral care.  SLP Visit Diagnosis Dysphagia, pharyngeal phase (R13.13) Attention and concentration deficit following -- Frontal lobe and executive function deficit following -- Impact on safety and function Severe aspiration risk   CHL IP TREATMENT RECOMMENDATION 04/02/2018 Treatment Recommendations Therapy as outlined in treatment plan below   Prognosis 04/02/2018 Prognosis for Safe Diet Advancement Fair Barriers to Reach Goals -- Barriers/Prognosis Comment -- CHL IP DIET RECOMMENDATION 04/02/2018 SLP Diet Recommendations NPO;Alternative means - temporary Liquid Administration via -- Medication Administration -- Compensations -- Postural Changes --   CHL IP OTHER RECOMMENDATIONS 04/02/2018 Recommended Consults -- Oral Care Recommendations Oral care QID Other Recommendations --   CHL IP FOLLOW UP RECOMMENDATIONS 04/02/2018 Follow up Recommendations Inpatient Rehab   CHL IP FREQUENCY AND DURATION 04/02/2018 Speech Therapy Frequency (ACUTE ONLY) min 3x week Treatment Duration 1 week      CHL IP ORAL PHASE 04/02/2018 Oral Phase Impaired Oral - Pudding Teaspoon -- Oral - Pudding Cup -- Oral - Honey Teaspoon -- Oral - Honey Cup -- Oral - Nectar Teaspoon -- Oral - Nectar Cup -- Oral - Nectar Straw Piecemeal swallowing Oral - Thin Teaspoon -- Oral - Thin Cup -- Oral - Thin Straw Piecemeal swallowing Oral - Puree Piecemeal swallowing Oral - Mech Soft -- Oral - Regular -- Oral - Multi-Consistency -- Oral - Pill -- Oral Phase - Comment --  CHL IP PHARYNGEAL PHASE 04/02/2018 Pharyngeal Phase Impaired Pharyngeal- Pudding Teaspoon -- Pharyngeal -- Pharyngeal- Pudding Cup -- Pharyngeal --  Pharyngeal- Honey Teaspoon -- Pharyngeal -- Pharyngeal- Honey Cup -- Pharyngeal -- Pharyngeal- Nectar Teaspoon Delayed swallow initiation-vallecula;Reduced pharyngeal peristalsis;Reduced epiglottic inversion;Reduced laryngeal elevation;Reduced airway/laryngeal closure;Penetration/Aspiration during swallow;Penetration/Apiration after swallow;Pharyngeal residue - pyriform;Pharyngeal residue - valleculae;Penetration/Aspiration before swallow Pharyngeal Material enters airway, remains ABOVE vocal cords and not ejected out Pharyngeal- Nectar Cup -- Pharyngeal -- Pharyngeal- Nectar Straw Delayed swallow initiation-vallecula;Reduced laryngeal elevation;Reduced airway/laryngeal closure;Penetration/Aspiration before swallow;Penetration/Aspiration during swallow;Penetration/Apiration after swallow;Pharyngeal residue - valleculae;Pharyngeal residue - pyriform Pharyngeal Material enters airway, remains ABOVE vocal cords and not ejected out Pharyngeal- Thin Teaspoon Delayed swallow initiation-pyriform sinuses;Reduced pharyngeal peristalsis;Reduced epiglottic inversion;Reduced laryngeal elevation;Reduced airway/laryngeal closure;Penetration/Aspiration before swallow;Trace aspiration;Pharyngeal residue - valleculae;Pharyngeal residue - pyriform Pharyngeal Material enters airway, passes BELOW cords and not ejected out despite cough attempt by patient Pharyngeal- Thin Cup -- Pharyngeal -- Pharyngeal- Thin Straw Delayed swallow initiation-pyriform sinuses;Reduced pharyngeal peristalsis;Reduced epiglottic inversion;Reduced laryngeal elevation;Reduced airway/laryngeal closure;Penetration/Aspiration before swallow;Penetration/Aspiration during swallow;Penetration/Apiration after swallow;Trace aspiration;Pharyngeal residue - valleculae;Pharyngeal residue - pyriform;Compensatory strategies attempted (with notebox) Pharyngeal Material enters airway, passes BELOW cords and not ejected out despite cough attempt by patient Pharyngeal- Puree  Delayed swallow initiation-vallecula;Reduced pharyngeal peristalsis;Reduced epiglottic inversion;Reduced laryngeal elevation;Reduced airway/laryngeal closure;Pharyngeal residue - valleculae;Pharyngeal residue - pyriform Pharyngeal -- Pharyngeal- Mechanical Soft -- Pharyngeal -- Pharyngeal- Regular -- Pharyngeal -- Pharyngeal- Multi-consistency -- Pharyngeal -- Pharyngeal- Pill -- Pharyngeal -- Pharyngeal Comment --  [facility] IP CERVICAL ESOPHAGEAL PHASE 04/02/2018 Cervical Esophageal Phase (No Data) Pudding Teaspoon -- Pudding Cup -- Honey Teaspoon -- Honey Cup -- Nectar Teaspoon -- Nectar Cup -- Nectar Straw -- Thin Teaspoon -- Thin Cup -- Thin Straw -- Puree -- Mechanical Soft -- Regular -- Multi-consistency -- Pill -- Cervical Esophageal Comment -- [name] 04/02/2018, 1:50 PM              Ct Head Code Stroke Wo Contrast  Result Date: 04/01/2018 CLINICAL DATA:  Code stroke. Code stroke. Difficulty swallowing. Left-sided  weakness. Last seen normal 1 hour ago. EXAM: CT HEAD WITHOUT CONTRAST TECHNIQUE: Contiguous axial images were obtained from the base of the skull through the vertex without intravenous contrast. COMPARISON:  CT head without contrast 10/04/2017 FINDINGS: Brain: Atrophy and white matter changes are within normal limits for age. Basal ganglia are intact. No acute infarct, hemorrhage, or mass lesion is present. Insular cortex is within normal limits. No acute or focal cortical abnormality is present ventricles are proportionate to the degree of atrophy. No significant extra-axial fluid collection is present. The brainstem and cerebellum are normal. Vascular: Atherosclerotic calcifications are present the cavernous internal carotid arteries bilaterally and at the dural margin of both vertebral arteries. There is no hyperdense vessel. Skull: Calvarium is intact. No focal lytic or blastic lesions are present. Sinuses/Orbits: Chronic left maxillary sinus disease present. The paranasal sinuses  and mastoid air cells are otherwise clear. Globes and orbits are within normal limits bilaterally. ASPECTS [facility] Stroke Program Early CT Score) - Ganglionic level infarction (caudate, lentiform nuclei, internal capsule, insula, M1-M3 cortex): 7/7 - Supraganglionic infarction (M4-M6 cortex): 3/3 Total score (0-10 with 10 being normal): 10/10 IMPRESSION: 1. Stable atrophy and white matter disease, likely within normal limits for age. 2. No acute intracranial abnormality. 3. Atherosclerosis. 4. ASPECTS is 10/10 The above was relayed via text pager to Dr. Erlinda Hong on 04/01/2018 at 14:49 . Electronically Signed   By: San Morelle M.D.   On: 04/01/2018 14:51    Assessment/Plan 1. Hypertension, essential Blood pressures have been well controlled.  He is however on once a day losartan change that to half tablet twice a day for better blood pressure coverage  2. PAD (peripheral artery disease) (HCC) No palpable pulses in the feet but feet are warm and dry.  3. Paroxysmal atrial fibrillation (HCC) Normal rate he is on no medications for rhythm his rhythm on my exam today appears to be sinus  4. Cerebrovascular accident (CVA) due to thrombosis of precerebral artery [facility]) Patient has recovered much of function prior to stroke.  He does have some residual weakness in the right hand.  I did not observe his gait but main problem appears to me to be dysphagia with choking coughing when swallowing.  Speech therapy will work with him for this  5. Controlled type 2 diabetes mellitus with stage 3 chronic kidney disease, without long-term current use of insulin ([place]) Most recent A1c is 7.6.  Will continue with metformin 1000 mg twice daily  6. Brainstem infarct, acute ([place]) s/p IV tPA See above for discussion of stroke.  Maintain normal blood pressure and low-dose aspirin  7. Hyperlipidemia LDL goal <70 Statin has been increased with stated goal less than 74 LDL   Family/ staff Communication:   Labs/tests  ordered:  Lillette Boxer. [place], [place] [address] [place], [place] Office [phone]

## 2018-04-11 ENCOUNTER — Encounter: Payer: Self-pay | Admitting: Family Medicine

## 2018-04-12 ENCOUNTER — Encounter: Payer: Self-pay | Admitting: Family

## 2018-04-12 ENCOUNTER — Non-Acute Institutional Stay (SKILLED_NURSING_FACILITY): Payer: Medicare Other | Admitting: Family

## 2018-04-12 DIAGNOSIS — R1310 Dysphagia, unspecified: Secondary | ICD-10-CM

## 2018-04-12 DIAGNOSIS — R471 Dysarthria and anarthria: Secondary | ICD-10-CM

## 2018-04-12 DIAGNOSIS — J849 Interstitial pulmonary disease, unspecified: Secondary | ICD-10-CM

## 2018-04-12 NOTE — Progress Notes (Signed)
Location:  [facility] Room Number: N37A Place of Service:  SNF [phone]) Provider: Laurence Folz FNP-C  Blanchie Serve, MD  Patient Care Team: Blanchie Serve, MD as PCP - General (Internal Medicine) Buford Dresser, MD as PCP - Cardiology (Cardiology)  Extended Emergency Contact Information Primary Emergency Contact: [place] Address: [address]          [place], [place] [address] [place] Phone: [phone] Relation: Daughter  Code Status:  DNR Goals of care: Advanced Directive information Advanced Directives 04/12/2018  Does Patient Have a Medical Advance Directive? Yes  Type of Paramedic of [place];Living will  Does patient want to make changes to medical advance directive? No - Patient declined  Copy of [place] in Chart? Yes     Chief Complaint  Patient presents with  . Acute Visit    cough     HPI:  Pt is a 84 y.o. male seen today at [facility] for an acute visit for evaluation of cough.He is seen in his room today with facility Nurse present at bedside.He states continues to cough and choke at times during meals.Also still has difficulties with his speech.He tells me his right hand swelling has improved previous PIV site in the hospital.Facility Nurse notified provider that patient was choking during his meals.Patient was evaluated by speech therapist and his liquids has been switched to honey thicken.Portable chest X-ray also ordered results shows interstitial pneumonitis.He denies any fever or chills.His CBG readings ranging in the 150's-190's.Of note he is status post hospital admission 04/01/2018- 04/09/2018 post acute brainstem infarct s/p IV tPA.He has been working with Physical ,Occupational  and speech therapy.He is currently on ASA 81 mg tablet daily until 04/16/2018 then start on Eliquis 2.5 mg tablet twice daily per hospital discharge recommendation.   Past  Medical History:  Diagnosis Date  . BPH (benign prostatic hyperplasia) 10/14/2014  . Cervicalgia 01/04/2016  . Contusion of left shoulder 05/27/14  . DM type 2 (diabetes mellitus, type 2) ([place])   . High blood pressure   . History of basal cell cancer 11/21/2005   Right temple  . History of colonic polyps 10/14/2003  . Hyperlipidemia   . Left knee pain 05/27/14  . 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 TIA (transient ischemic attack) 11/06/2005   Left eye pain. Slurred speech. Diaphoresis. No residual effects.    Past Surgical History:  Procedure Laterality Date  . Achilles tendon repair Left 1998   ruptured tendon  . COLONOSCOPY  1992   Polyp removed  . COLONOSCOPY  1998   normal  . ORIF FEMUR FRACTURE Right 1929  . TONSILLECTOMY      No Known Allergies  Outpatient Encounter Medications as of 04/12/2018  Medication Sig  . acetaminophen (TYLENOL 8 HOUR) 650 MG CR tablet Take 1 tablet (650 mg total) by mouth every 8 (eight) hours as needed for pain.  Marland Kitchen aspirin EC 81 MG tablet Take 81 mg by mouth every morning.  . Blood Glucose Monitoring Suppl (ONE TOUCH ULTRA SYSTEM KIT) w/Device KIT Use to test blood sugar twice daily. Dx E11.9  . Calcium Carb-Cholecalciferol (CALCIUM 600+D3 PO) Take 1 tablet by mouth daily.  . Cholecalciferol 1000 UNITS tablet Take 1,000 Units by mouth daily.  Marland Kitchen doxycycline (VIBRAMYCIN) 100 MG capsule Take 100 mg by mouth 2 (two) times daily.  Marland Kitchen glucose blood test strip One Touch Ultra Test Strip Sig:Use to test blood  sugar twice daily. Dx E11.9  . Lancets (UNILET COMFORTOUCH LANCET) MISC Use to test blood sugar twice daily. Dx: E11.9  . losartan (COZAAR) 25 MG tablet Take 25 mg by mouth daily.  . metFORMIN (GLUCOPHAGE) 1000 MG tablet TAKE ONE TABLET BY MOUTH EVERY MORNING TO CONTROL DIABETES  . metoprolol tartrate (LOPRESSOR) 25 MG tablet Take 1 tablet (25 mg total) by mouth 2 (two) times daily.  Marland Kitchen Propylene Glycol-Glycerin 1-0.3 % SOLN Place 2  drops into both eyes as needed.  . saccharomyces boulardii (FLORASTOR) 250 MG capsule Take 250 mg by mouth 2 (two) times daily.  . simvastatin (ZOCOR) 10 MG tablet Take 10 mg by mouth daily.  Marland Kitchen zinc oxide 20 % ointment Apply 1 application topically as needed for irritation. To buttocks after every incontinent episode and as needed for redness. May keep at bedside.   No facility-administered encounter medications on file as of 04/12/2018.     Review of Systems  Constitutional: Negative for appetite change, chills, fatigue, fever and unexpected weight change.  HENT: Positive for trouble swallowing and voice change. Negative for congestion, rhinorrhea, sinus pressure, sinus pain, sneezing and sore throat.   Eyes: Negative for discharge, redness and visual disturbance.  Respiratory: Positive for cough. Negative for chest tightness, shortness of breath and wheezing.   Cardiovascular: Negative for chest pain, palpitations and leg swelling.  Gastrointestinal: Negative for abdominal distention, abdominal pain, constipation, diarrhea, nausea and vomiting.  Endocrine: Negative for polydipsia, polyphagia and polyuria.  Genitourinary: Negative for dysuria, flank pain, frequency and urgency.  Musculoskeletal: Positive for gait problem.  Skin: Negative for color change, pallor, rash and wound.  Neurological: Positive for speech difficulty, weakness and numbness. Negative for dizziness, tremors, facial asymmetry, light-headedness and headaches.  Hematological: Does not bruise/bleed easily.  Psychiatric/Behavioral: Negative for agitation, confusion and sleep disturbance. The patient is not nervous/anxious.     Immunization History  Administered Date(s) Administered  . DTaP 12/31/2012  . Influenza, High Dose Seasonal PF 04/04/2017  . Influenza-Unspecified 04/10/2014, 03/25/2015, 04/06/2016  . PPD Test 04/29/2014  . Pneumococcal Conjugate-13 04/28/2017  . Pneumococcal-Unspecified 03/13/2011  . Tdap  04/28/2017  . Zoster Recombinat (Shingrix) 12/14/2017   Pertinent  Health Maintenance Due  Topic Date Due  . OPHTHALMOLOGY EXAM  07/07/2017  . INFLUENZA VACCINE  04/25/2018 (Originally 01/24/2018)  . FOOT EXAM  06/27/2018  . HEMOGLOBIN A1C  10/02/2018  . PNA vac Low Risk Adult  Completed   Fall Risk  04/27/2017 03/21/2017 09/07/2015 09/08/2014  Falls in the past year? No No No Yes  Number falls in past yr: - - - 1  Injury with Fall? - - - No  Risk for fall due to : - Medication side effect - -    Vitals:   04/12/18 1127  BP: (!) 150/78  Pulse: 84  Resp: 20  Temp: 98.5 F (36.9 C)  TempSrc: Oral  SpO2: 95%  Weight: 148 lb (67.1 kg)  Height: '6\' 1"'$  (1.854 m)   Body mass index is 19.53 kg/m. Physical Exam  Constitutional: He is oriented to person, place, and time. He appears well-developed and well-nourished.  Elderly in no acute distress   HENT:  Head: Normocephalic.  Right Ear: External ear normal.  Left Ear: External ear normal.  Mouth/Throat: Oropharynx is clear and moist. No oropharyngeal exudate.  Eyes: Pupils are equal, round, and reactive to light. Conjunctivae and EOM are normal. Right eye exhibits no discharge. Left eye exhibits no discharge. No scleral icterus.  Neck: Normal  range of motion. No JVD present. No thyromegaly present.  Cardiovascular: Intact distal pulses. Exam reveals no gallop and no friction rub.  Murmur heard. Pulmonary/Chest: Effort normal. No respiratory distress. He has no wheezes. He has rales. He exhibits no tenderness.  Left lower and right upper lobe rales noted.   Abdominal: Soft. Bowel sounds are normal. He exhibits no distension and no mass. There is no tenderness. There is no rebound and no guarding.  Musculoskeletal: He exhibits no tenderness.  Moves x 4 extremities except right hand slight weakness noted.right forearm slight edema though has improved per patient.   Lymphadenopathy:    He has no cervical adenopathy.  Neurological: He is  oriented to person, place, and time.  Hypophonic and dysarthria noted.right hand slight weakness noted.Examined in the bed gait not assessed.   Skin: Skin is warm and dry. No rash noted. No erythema. No pallor.  Psychiatric: He has a normal mood and affect. His behavior is normal. Judgment and thought content normal.  Dysarthria   Nursing note and vitals reviewed.   Labs reviewed: Recent Labs    04/03/18 0304 04/04/18 0426 04/06/18 0348  NA 136 138 141  K 3.7 3.8 3.7  CL 105 105 110  CO2 22 18* 24  GLUCOSE 148* 156* 141*  BUN 28* 28* 24*  CREATININE 1.36* 1.30* 1.10  CALCIUM 8.6* 8.4* 8.4*   Recent Labs    09/10/17 0750 03/13/18 0000 04/01/18 1432  AST '18 24 25  '$ ALT '21 26 19  '$ ALKPHOS  --   --  57  BILITOT 0.7 0.9 0.9  PROT 6.5 6.6 6.6  ALBUMIN  --   --  3.7   Recent Labs    04/01/18 1432  04/03/18 0304 04/04/18 0426 04/06/18 0348  WBC 8.9   < > 24.0* 15.2* 12.4*  NEUTROABS 6.3  --   --   --   --   HGB 12.7*   < > 12.0* 13.3 13.5  HCT 38.4*   < > 36.2* 40.0 40.5  MCV 104.9*   < > 100.3* 101.8* 102.0*  PLT 317   < > 365 338 336   < > = values in this interval not displayed.   No results found for: TSH Lab Results  Component Value Date   HGBA1C 7.6 (H) 04/02/2018   Lab Results  Component Value Date   CHOL 143 04/02/2018   HDL 57 04/02/2018   LDLCALC 80 04/02/2018   TRIG 29 04/02/2018   CHOLHDL 2.5 04/02/2018    Significant Diagnostic Results in last 30 days:  Chest X-ray 04/11/2018 Impression : Interstitial densities centrally compatible with interstitial pneumonia .Mild cardiomegaly.Mild degree osteopenia and moderate Osteoarthritis demonstrated.   Ct Angio Head W Or Wo Contrast  Result Date: 04/01/2018 CLINICAL DATA:  Stroke EXAM: CT ANGIOGRAPHY HEAD AND NECK TECHNIQUE: Multidetector CT imaging of the head and neck was performed using the standard protocol during bolus administration of intravenous contrast. Multiplanar CT image reconstructions and  MIPs were obtained to evaluate the vascular anatomy. Carotid stenosis measurements (when applicable) are obtained utilizing NASCET criteria, using the distal internal carotid diameter as the denominator. CONTRAST:  5m ISOVUE-370 IOPAMIDOL (ISOVUE-370) INJECTION 76% COMPARISON:  CT head 04/01/2018 FINDINGS: CTA NECK FINDINGS Aortic arch: Atherosclerotic calcification aortic arch and proximal great vessels. Proximal great vessels widely patent. Right carotid system: Atherosclerotic calcification right carotid bulb without significant stenosis. Left carotid system: Atherosclerotic calcification left carotid bulb. 25% diameter stenosis internal carotid artery on the left. Remainder  of the left internal carotid artery widely patent. Vertebral arteries: Right vertebral artery dominant. Moderate stenosis origin of right vertebral artery. Remainder of the right vertebral artery is widely patent to the basilar with mild atherosclerotic disease distally. Moderate stenosis at the origin of the left vertebral artery. Left vertebral artery diffusely diseased and occludes at the C1 level and does not continue to the basilar. Left PICA not visualized. Skeleton: Cervical spondylosis without acute skeletal abnormality. Other neck: Negative Upper chest: Mild bronchiectasis and scarring in the right upper lobe. 5 mm right upper lobe nodule axial image 158 Review of the MIP images confirms the above findings CTA HEAD FINDINGS Anterior circulation: Atherosclerotic calcification in the cavernous carotid bilaterally causing mild stenosis bilaterally. Anterior and middle cerebral arteries patent bilaterally without stenosis or large vessel occlusion Posterior circulation: Right vertebral artery patent to the basilar with mild calcific stenosis V4 segment. Right PICA is widely patent. Occlusion of left vertebral artery at the C1 level without reconstitution. Left PICA not visualized. Basilar widely patent. Mild atherosclerotic disease in  the basilar artery. Superior cerebellar and posterior cerebral arteries patent bilaterally without stenosis. Venous sinuses: Patent Anatomic variants: None Delayed phase: Not performed Review of the MIP images confirms the above findings IMPRESSION: Mild atherosclerotic disease in the carotid bifurcation bilaterally. Atherosclerotic calcification and mild stenosis in the cavernous carotid bilaterally. Moderate stenosis at the origin of the vertebral artery bilaterally. Occlusion distal left vertebral artery at the C1 level. Left PICA occluded. This could be acute or chronic. Correlate with neurologic examination and MRI. These results were called by telephone at the time of interpretation on 04/01/2018 at 3:18 pm to Dr. Rosalin Hawking , who verbally acknowledged these results. Electronically Signed   By: Franchot Gallo M.D.   On: 04/01/2018 15:18   Dg Chest 1 View  Result Date: 04/06/2018 CLINICAL DATA:  Pt with increase secretion and coughing after full liquid diet. Lung sounds noted "gurgling". Concern for aspiration. EXAM: CHEST  1 VIEW COMPARISON:  Chest x-ray dated 04/03/2018. FINDINGS: Heart size and mediastinal contours are stable. Lungs are clear. No pleural effusion or pneumothorax seen. No acute or suspicious osseous finding IMPRESSION: No active disease.  No evidence of pneumonia or aspiration. Electronically Signed   By: Franki Cabot M.D.   On: 04/06/2018 11:48   Dg Shoulder Right  Result Date: 04/02/2018 CLINICAL DATA:  Right shoulder pain for several years EXAM: RIGHT SHOULDER - 2+ VIEW COMPARISON:  None. FINDINGS: Degenerative changes of the acromioclavicular joint are seen. No acute fracture or dislocation is noted. The humeral head is high-riding consistent with chronic right-sided cuff injury. The underlying bony thorax is within normal limits. No soft tissue changes are seen. IMPRESSION: Chronic changes without acute abnormality. Electronically Signed   By: Inez Catalina M.D.   On: 04/02/2018  14:17   Ct Head Wo Contrast  Result Date: 04/09/2018 CLINICAL DATA:  84 y/o  M; stroke for follow-up. EXAM: CT HEAD WITHOUT CONTRAST TECHNIQUE: Contiguous axial images were obtained from the base of the skull through the vertex without intravenous contrast. COMPARISON:  04/06/2018 CT head. 04/02/2018 MRI head. FINDINGS: Brain: Stable lucency within left hemi medulla corresponding to infarct on prior MRI and CT. Interval partial dispersion of small volume of hemorrhage within the left temporal lobe and subarachnoid hemorrhage over the left temporal convexity. No new acute intracranial hemorrhage, stroke, focal mass effect, extra-axial collection, or herniation. Stable background of chronic microvascular ischemic changes and volume loss of the brain. Vascular: Calcific  atherosclerosis of carotid siphons and the vertebral arteries. Skull: Normal. Negative for fracture or focal lesion. Sinuses/Orbits: Normal aeration of the mastoid air cells. Chronic left maxillary sinus disease. Additional paranasal sinuses are normally aerated. Orbits are unremarkable. Other: None. IMPRESSION: 1. Stable left medulla subacute infarction. 2. Interval partial dispersion of small volume of hemorrhage within the left temporal lobe and subarachnoid hemorrhage over the left temporal convexity. 3. No new acute intracranial abnormality. 4. Stable background of chronic microvascular ischemic changes and volume loss of the brain. Electronically Signed   By: Kristine Garbe M.D.   On: 04/09/2018 02:15   Ct Head Wo Contrast  Result Date: 04/06/2018 CLINICAL DATA:  Stroke left medulla. TPA given. Follow-up subarachnoid hemorrhage. EXAM: CT HEAD WITHOUT CONTRAST TECHNIQUE: Contiguous axial images were obtained from the base of the skull through the vertex without intravenous contrast. COMPARISON:  CT head 04/04/2018, MRI 04/02/2018, CT 04/01/2018 FINDINGS: Brain: Subcentimeter hemorrhage in the left posterior temporal lobe is  unchanged appears parenchymal. Adjacent small volume subarachnoid hemorrhage also unchanged. No new areas of hemorrhage Acute infarct left medulla best seen on MRI however hypodensity is seen in this area on CT. Moderate atrophy and moderate chronic microvascular ischemic changes. No mass lesion. Vascular: Atherosclerotic calcification. 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 Negative orbit. Other: None IMPRESSION: Stable small volume hemorrhage in the left posterior temporal lobe and adjacent subarachnoid space. No new area of hemorrhage Acute infarct left medulla best seen on MRI. Electronically Signed   By: Franchot Gallo M.D.   On: 04/06/2018 10:22   Ct Head Wo Contrast  Result Date: 04/04/2018 CLINICAL DATA:  Subarachnoid hemorrhage EXAM: CT HEAD WITHOUT CONTRAST TECHNIQUE: Contiguous axial images were obtained from the base of the skull through the vertex without intravenous contrast. COMPARISON:  Head CT 04/01/2018, 04/02/2018 Brain MRI 04/02/2018 FINDINGS: Brain: Small volume left temporal subarachnoid hemorrhage has increased slightly from the prior study. The medial aspect is slightly more rounded in contour and could be intraparenchymal petechial hemorrhage. No contralateral hemorrhage or discrete new site of hemorrhage. There is no midline shift or mass effect. There is periventricular hypoattenuation compatible with chronic microvascular disease. Vascular: There is atherosclerotic calcification of the internal carotid arteries at the skull base. Skull: Normal Sinuses/Orbits: Paranasal sinuses are clear.  The orbits are normal. Other: None. IMPRESSION: Slightly increased volume of left temporal lobe subarachnoid hemorrhage, remote from the site of ischemia demonstrated on the MRI of 04/02/2018. There may be a small amount of superimposed intraparenchymal petechial hemorrhage. Heidelberg classification 3c:  Subarachnoid hemorrhage, remote from area of infarcted brain tissue. Electronically Signed   By: Ulyses Jarred M.D.   On: 04/04/2018 06:59   Ct Head Wo Contrast  Result Date: 04/02/2018 CLINICAL DATA:  Stroke follow-up, 24 hours post tPA EXAM: CT HEAD WITHOUT CONTRAST TECHNIQUE: Contiguous axial images were obtained from the base of the skull through the vertex without intravenous contrast. COMPARISON:  Yesterday FINDINGS: Brain: Small volume subarachnoid hemorrhage within the left temporoparietal sulcus, in retrospect stable from prior. No visible infarct, mass, hydrocephalus, or collection. Vascular: Atherosclerotic calcification Skull: No acute or aggressive finding Sinuses/Orbits: Chronic left maxillary sinusitis. Critical Value/emergent results were called by telephone at the time of interpretation on 04/02/2018 at 1:57 pm to Dr. Rosalin Hawking , who verbally acknowledged these results. IMPRESSION: 1. Small volume subarachnoid hemorrhage along the left temporoparietal convexity, in retrospect stable from yesterday. 2. No visible acute infarct. Electronically Signed  By: Monte Fantasia M.D.   On: 04/02/2018 14:03   Ct Angio Neck W And/or Wo Contrast  Result Date: 04/01/2018 CLINICAL DATA:  Stroke EXAM: CT ANGIOGRAPHY HEAD AND NECK TECHNIQUE: Multidetector CT imaging of the head and neck was performed using the standard protocol during bolus administration of intravenous contrast. Multiplanar CT image reconstructions and MIPs were obtained to evaluate the vascular anatomy. Carotid stenosis measurements (when applicable) are obtained utilizing NASCET criteria, using the distal internal carotid diameter as the denominator. CONTRAST:  36m ISOVUE-370 IOPAMIDOL (ISOVUE-370) INJECTION 76% COMPARISON:  CT head 04/01/2018 FINDINGS: CTA NECK FINDINGS Aortic arch: Atherosclerotic calcification aortic arch and proximal great vessels. Proximal great vessels widely patent. Right carotid system: Atherosclerotic  calcification right carotid bulb without significant stenosis. Left carotid system: Atherosclerotic calcification left carotid bulb. 25% diameter stenosis internal carotid artery on the left. Remainder of the left internal carotid artery widely patent. Vertebral arteries: Right vertebral artery dominant. Moderate stenosis origin of right vertebral artery. Remainder of the right vertebral artery is widely patent to the basilar with mild atherosclerotic disease distally. Moderate stenosis at the origin of the left vertebral artery. Left vertebral artery diffusely diseased and occludes at the C1 level and does not continue to the basilar. Left PICA not visualized. Skeleton: Cervical spondylosis without acute skeletal abnormality. Other neck: Negative Upper chest: Mild bronchiectasis and scarring in the right upper lobe. 5 mm right upper lobe nodule axial image 158 Review of the MIP images confirms the above findings CTA HEAD FINDINGS Anterior circulation: Atherosclerotic calcification in the cavernous carotid bilaterally causing mild stenosis bilaterally. Anterior and middle cerebral arteries patent bilaterally without stenosis or large vessel occlusion Posterior circulation: Right vertebral artery patent to the basilar with mild calcific stenosis V4 segment. Right PICA is widely patent. Occlusion of left vertebral artery at the C1 level without reconstitution. Left PICA not visualized. Basilar widely patent. Mild atherosclerotic disease in the basilar artery. Superior cerebellar and posterior cerebral arteries patent bilaterally without stenosis. Venous sinuses: Patent Anatomic variants: None Delayed phase: Not performed Review of the MIP images confirms the above findings IMPRESSION: Mild atherosclerotic disease in the carotid bifurcation bilaterally. Atherosclerotic calcification and mild stenosis in the cavernous carotid bilaterally. Moderate stenosis at the origin of the vertebral artery bilaterally. Occlusion  distal left vertebral artery at the C1 level. Left PICA occluded. This could be acute or chronic. Correlate with neurologic examination and MRI. These results were called by telephone at the time of interpretation on 04/01/2018 at 3:18 pm to Dr. JRosalin Hawking, who verbally acknowledged these results. Electronically Signed   By: CFranchot GalloM.D.   On: 04/01/2018 15:18   Mr Brain Wo Contrast  Result Date: 04/02/2018 CLINICAL DATA:  Stroke. EXAM: MRI HEAD WITHOUT CONTRAST TECHNIQUE: Multiplanar, multiecho pulse sequences of the brain and surrounding structures were obtained without intravenous contrast. COMPARISON:  CT head 04/01/2018. CT head 04/02/2018. CTA head 04/01/2018. FINDINGS: Brain: Acute LEFT inferior medullary infarct, restricted diffusion, corresponding low ADC, no blood products, LEFT PICA territory, correlates with LEFT vertebral and PICA occlusion on CTA. Elsewhere no acute stroke, mass lesion, or hydrocephalus. Atrophy with chronic microvascular ischemic change. Acute subarachnoid blood in the LEFT posterior temporal region correlates with the CT abnormalities, and appears increased from the original CT, when technique differences are considered. No draining vein. There may be a tiny focus of parenchymal hemorrhage of an acute nature, see series 12, image 24. Etiology unknown, query occult trauma, with shearing injury. Vascular: Flow voids are  maintained in the carotid, basilar, and RIGHT vertebral arteries. LEFT vertebral demonstrates lack of flow void, consistent with acute occlusion. Skull and upper cervical spine: Normal marrow signal. Sinuses/Orbits: Chronic sinus disease. Hypoplastic LEFT maxillary sinus. Negative orbits. Other: Normal mastoids. IMPRESSION: Acute LEFT medullary brainstem infarct correlating with recent LEFT vertebral and PICA occlusion. LEFT posterior temporal subarachnoid hemorrhage, uncertain etiology, appears to be increased volume from original CT, perhaps tPA related.  Atrophy with chronic microvascular ischemic change. Electronically Signed   By: Staci Righter M.D.   On: 04/02/2018 14:34   Dg Chest Port 1 View  Result Date: 04/03/2018 CLINICAL DATA:  Congestion, weakness. EXAM: PORTABLE CHEST 1 VIEW COMPARISON:  None. FINDINGS: The heart size and mediastinal contours are within normal limits. Atherosclerosis of thoracic aorta is noted. No pneumothorax or pleural effusion is noted. Right lung is clear. Minimal left basilar subsegmental atelectasis is noted. The visualized skeletal structures are unremarkable. IMPRESSION: Minimal left basilar subsegmental atelectasis. Aortic Atherosclerosis (ICD10-I70.0). Electronically Signed   By: Marijo Conception, M.D.   On: 04/03/2018 12:34   Dg Swallowing Func-speech Pathology  Result Date: 04/05/2018 Objective Swallowing Evaluation: Type of Study: MBS-Modified Barium Swallow Study  Patient Details Name: ARLEN DUPUIS MRN: [id number] Date of Birth: 01/19/1922 Today's Date: 04/05/2018 Time: SLP Start Time (ACUTE ONLY): 1043 -SLP Stop Time (ACUTE ONLY): 1114 SLP Time Calculation (min) (ACUTE ONLY): 31 min Past Medical History: Past Medical History: Diagnosis Date . BPH (benign prostatic hyperplasia) 10/14/2014 . Cervicalgia 01/04/2016 . Contusion of left shoulder 05/27/14 . DM type 2 (diabetes mellitus, type 2) ([place])  . High blood pressure  . History of basal cell cancer 11/21/2005  Right temple . History of colonic polyps 10/14/2003 . Hyperlipidemia  . Left knee pain 05/27/14 . 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 TIA (transient ischemic attack) 11/06/2005  Left eye pain. Slurred speech. Diaphoresis. No residual effects.  Past Surgical History: Past Surgical History: Procedure Laterality Date . Achilles tendon repair Left 1998  ruptured tendon . COLONOSCOPY  1992  Polyp removed . COLONOSCOPY  1998  normal . ORIF FEMUR FRACTURE Right 1929 . TONSILLECTOMY   HPI: 84 y.o. male with PMH of HTN, HLD, DM, PVD presented with acute onset  difficulty speech and swallowing, left-sided ataxia.  CT head showed stable atrophy and white matter disease, likely within normal limits for age. MRI acute left medullary infarct  Subjective: alert and pleasant Assessment / Plan / Recommendation CHL IP CLINICAL IMPRESSIONS 04/05/2018 Clinical Impression Pt presents with notable improvement from [facility] 10/8 with continued moderate pharyngeal dysphagia today characterized by decreased contraction and duration of swallow resulting in moderate pharyngeal residue. Aspiration of thin liquids before, during and after the swallow was sensed and imaging was unclear however suspect clearance of aspirates due to strong reflexive cough. Airway protection sufficient with NTL with moderate residue in the valleculae and pyriforms that was successfully cleared followed by a STRONG throat clear and 2-3 additional dry swallows. AP view reveals mildly increased residue on the L however R head tilt and L head turn were both ineffective in facilitating clearance of residue. Improved UES opening secondary to improved laryngeal mobility from initial study resulting in decreased residue in the pyriforms and improved ability to clear residue. Continued severe residue of puree in the valleculae was unsuccessfully cleared with 3-4 additional swallows and note chin tuck and NTL wash were minimally effective in facilitating clearance of residue. Recommend NECTAR thick liquid diet with continuation of exercise plan and repeat  MBS Monday 10/14 given rapid spontaneous improvement. Strategies: following sip of NTL demonstrate throat clear and 2-3 dry swallows.  SLP Visit Diagnosis Dysphagia, oropharyngeal phase (R13.12) Attention and concentration deficit following -- Frontal lobe and executive function deficit following -- Impact on safety and function Moderate aspiration risk   CHL IP TREATMENT RECOMMENDATION 04/05/2018 Treatment Recommendations Therapy as outlined in treatment plan below    Prognosis 04/05/2018 Prognosis for Safe Diet Advancement Fair Barriers to Reach Goals Severity of deficits Barriers/Prognosis Comment -- CHL IP DIET RECOMMENDATION 04/05/2018 SLP Diet Recommendations Nectar thick liquid Liquid Administration via Straw;Cup Medication Administration Other (Comment) Compensations Minimize environmental distractions;Small sips/bites;Clear throat after each swallow;Slow rate;Multiple dry swallows after each bite/sip Postural Changes Remain semi-upright after after feeds/meals (Comment);Seated upright at 90 degrees   CHL IP OTHER RECOMMENDATIONS 04/05/2018 Recommended Consults -- Oral Care Recommendations Oral care BID Other Recommendations Order thickener from pharmacy   CHL IP FOLLOW UP RECOMMENDATIONS 04/05/2018 Follow up Recommendations Inpatient Rehab   CHL IP FREQUENCY AND DURATION 04/05/2018 Speech Therapy Frequency (ACUTE ONLY) min 2x/week Treatment Duration 1 week      CHL IP ORAL PHASE 04/05/2018 Oral Phase WFL Oral - Pudding Teaspoon -- Oral - Pudding Cup -- Oral - Honey Teaspoon -- Oral - Honey Cup -- Oral - Nectar Teaspoon -- Oral - Nectar Cup -- Oral - Nectar Straw -- Oral - Thin Teaspoon -- Oral - Thin Cup -- Oral - Thin Straw -- Oral - Puree -- Oral - Mech Soft -- Oral - Regular -- Oral - Multi-Consistency -- Oral - Pill -- Oral Phase - Comment --  CHL IP PHARYNGEAL PHASE 04/05/2018 Pharyngeal Phase Impaired Pharyngeal- Pudding Teaspoon -- Pharyngeal -- Pharyngeal- Pudding Cup -- Pharyngeal -- Pharyngeal- Honey Teaspoon -- Pharyngeal -- Pharyngeal- Honey Cup -- Pharyngeal -- Pharyngeal- Nectar Teaspoon -- Pharyngeal -- Pharyngeal- Nectar Cup -- Pharyngeal -- Pharyngeal- Nectar Straw Penetration/Aspiration during swallow;Pharyngeal residue - valleculae;Pharyngeal residue - pyriform;Reduced pharyngeal peristalsis;Reduced anterior laryngeal mobility;Reduced airway/laryngeal closure;Delayed swallow initiation-pyriform sinuses;Reduced epiglottic inversion Pharyngeal --  Pharyngeal- Thin Teaspoon Penetration/Aspiration during swallow;Pharyngeal residue - valleculae;Pharyngeal residue - pyriform;Reduced pharyngeal peristalsis;Reduced anterior laryngeal mobility;Reduced airway/laryngeal closure;Delayed swallow initiation-pyriform sinuses;Penetration/Aspiration before swallow;Penetration/Apiration after swallow;Reduced epiglottic inversion Pharyngeal Material enters airway, passes BELOW cords then ejected out Pharyngeal- Thin Cup Penetration/Aspiration during swallow;Pharyngeal residue - valleculae;Pharyngeal residue - pyriform;Reduced pharyngeal peristalsis;Reduced anterior laryngeal mobility;Reduced airway/laryngeal closure;Delayed swallow initiation-pyriform sinuses;Penetration/Aspiration before swallow;Penetration/Apiration after swallow;Reduced epiglottic inversion Pharyngeal Material enters airway, passes BELOW cords then ejected out Pharyngeal- Thin Straw Penetration/Aspiration during swallow;Pharyngeal residue - valleculae;Pharyngeal residue - pyriform;Reduced pharyngeal peristalsis;Reduced anterior laryngeal mobility;Reduced airway/laryngeal closure;Delayed swallow initiation-pyriform sinuses;Reduced epiglottic inversion Pharyngeal Material enters airway, passes BELOW cords then ejected out Pharyngeal- Puree Reduced anterior laryngeal mobility;Reduced airway/laryngeal closure;Pharyngeal residue - valleculae;Pharyngeal residue - pyriform;Reduced epiglottic inversion Pharyngeal -- Pharyngeal- Mechanical Soft -- Pharyngeal -- Pharyngeal- Regular -- Pharyngeal -- Pharyngeal- Multi-consistency -- Pharyngeal -- Pharyngeal- Pill -- Pharyngeal -- Pharyngeal Comment --  CHL IP CERVICAL ESOPHAGEAL PHASE 04/02/2018 Cervical Esophageal Phase (No Data) Pudding Teaspoon -- Pudding Cup -- Honey Teaspoon -- Honey Cup -- Nectar Teaspoon -- Nectar Cup -- Nectar Straw -- Thin Teaspoon -- Thin Cup -- Thin Straw -- Puree -- Mechanical Soft -- Regular -- Multi-consistency -- Pill -- Cervical  Esophageal Comment -- Amelia H. Roddie Mc, CCC-SLP Speech Language Pathologist Wende Bushy 04/05/2018, 2:58 PM              Dg Swallowing Func-speech Pathology  Result Date: 04/02/2018 Objective Swallowing Evaluation: Type of Study: MBS-Modified Barium  Swallow Study  Patient Details Name: PAOLO OKANE MRN: [id number] Date of Birth: Nov 06, 1921 Today's Date: 04/02/2018 Time: SLP Start Time (ACUTE ONLY): 3267 -SLP Stop Time (ACUTE ONLY): 1330 SLP Time Calculation (min) (ACUTE ONLY): 35 min Past Medical History: Past Medical History: Diagnosis Date . BPH (benign prostatic hyperplasia) 10/14/2014 . Cervicalgia 01/04/2016 . Contusion of left shoulder 05/27/14 . DM type 2 (diabetes mellitus, type 2) ([place])  . High blood pressure  . History of basal cell cancer 11/21/2005  Right temple . History of colonic polyps 10/14/2003 . Hyperlipidemia  . Left knee pain 05/27/14 . SAH (subarachnoid hemorrhage) ([place]) 2009  TBI . TIA (transient ischemic attack) 11/06/2005  Left eye pain. Slurred speech. Diaphoresis. No residual effects.  Past Surgical History: Past Surgical History: Procedure Laterality Date . Achilles tendon repair Left 1998  ruptured tendon . COLONOSCOPY  1992  Polyp removed . COLONOSCOPY  1998  normal . ORIF FEMUR FRACTURE Right 1929 . TONSILLECTOMY   HPI: 84 y.o. male with PMH of HTN, HLD, DM, PVD presented with acute onset difficulty speech and swallowing, left-sided ataxia.  CT head showed stable atrophy and white matter disease, likely within normal limits for age. MRI pending.  Subjective: alert, communicative Assessment / Plan / Recommendation CHL IP CLINICAL IMPRESSIONS 04/02/2018 Clinical Impression Pt presents with a severe pharyngeal dysphagia marked by impaired mobility of the larynx and pharynx, leading to incomplete laryngeal vestibular closure (LVC) and poor pharyngeal squeeze.  There is sensed aspiration of liquids before and during the swallow due to incomplete LVC and after the swallow due to  spillage of residue from the pyriforms. There is severe vallecular and pyriform residue that pt is unable to clear despite postural adjustments.  UES only partially opens due to compromised elevation of the larynx - this contributes to the degree of pyriform residue.  Pt self-suctioned and worked to expectorate residue after the study.  For now, recommend continuing NPO; consider temporary enteral feeding via cortrak (spoke with Dr. Erlinda Hong); SLP to address therapeutic exercise.  Allow occasional ice chips after oral care.  SLP Visit Diagnosis Dysphagia, pharyngeal phase (R13.13) Attention and concentration deficit following -- Frontal lobe and executive function deficit following -- Impact on safety and function Severe aspiration risk   CHL IP TREATMENT RECOMMENDATION 04/02/2018 Treatment Recommendations Therapy as outlined in treatment plan below   Prognosis 04/02/2018 Prognosis for Safe Diet Advancement Fair Barriers to Reach Goals -- Barriers/Prognosis Comment -- CHL IP DIET RECOMMENDATION 04/02/2018 SLP Diet Recommendations NPO;Alternative means - temporary Liquid Administration via -- Medication Administration -- Compensations -- Postural Changes --   CHL IP OTHER RECOMMENDATIONS 04/02/2018 Recommended Consults -- Oral Care Recommendations Oral care QID Other Recommendations --   CHL IP FOLLOW UP RECOMMENDATIONS 04/02/2018 Follow up Recommendations Inpatient Rehab   CHL IP FREQUENCY AND DURATION 04/02/2018 Speech Therapy Frequency (ACUTE ONLY) min 3x week Treatment Duration 1 week      CHL IP ORAL PHASE 04/02/2018 Oral Phase Impaired Oral - Pudding Teaspoon -- Oral - Pudding Cup -- Oral - Honey Teaspoon -- Oral - Honey Cup -- Oral - Nectar Teaspoon -- Oral - Nectar Cup -- Oral - Nectar Straw Piecemeal swallowing Oral - Thin Teaspoon -- Oral - Thin Cup -- Oral - Thin Straw Piecemeal swallowing Oral - Puree Piecemeal swallowing Oral - Mech Soft -- Oral - Regular -- Oral - Multi-Consistency -- Oral - Pill -- Oral Phase -  Comment --  CHL IP PHARYNGEAL PHASE 04/02/2018 Pharyngeal Phase Impaired Pharyngeal- Pudding Teaspoon --  Pharyngeal -- Pharyngeal- Pudding Cup -- Pharyngeal -- Pharyngeal- Honey Teaspoon -- Pharyngeal -- Pharyngeal- Honey Cup -- Pharyngeal -- Pharyngeal- Nectar Teaspoon Delayed swallow initiation-vallecula;Reduced pharyngeal peristalsis;Reduced epiglottic inversion;Reduced laryngeal elevation;Reduced airway/laryngeal closure;Penetration/Aspiration during swallow;Penetration/Apiration after swallow;Pharyngeal residue - pyriform;Pharyngeal residue - valleculae;Penetration/Aspiration before swallow Pharyngeal Material enters airway, remains ABOVE vocal cords and not ejected out Pharyngeal- Nectar Cup -- Pharyngeal -- Pharyngeal- Nectar Straw Delayed swallow initiation-vallecula;Reduced laryngeal elevation;Reduced airway/laryngeal closure;Penetration/Aspiration before swallow;Penetration/Aspiration during swallow;Penetration/Apiration after swallow;Pharyngeal residue - valleculae;Pharyngeal residue - pyriform Pharyngeal Material enters airway, remains ABOVE vocal cords and not ejected out Pharyngeal- Thin Teaspoon Delayed swallow initiation-pyriform sinuses;Reduced pharyngeal peristalsis;Reduced epiglottic inversion;Reduced laryngeal elevation;Reduced airway/laryngeal closure;Penetration/Aspiration before swallow;Trace aspiration;Pharyngeal residue - valleculae;Pharyngeal residue - pyriform Pharyngeal Material enters airway, passes BELOW cords and not ejected out despite cough attempt by patient Pharyngeal- Thin Cup -- Pharyngeal -- Pharyngeal- Thin Straw Delayed swallow initiation-pyriform sinuses;Reduced pharyngeal peristalsis;Reduced epiglottic inversion;Reduced laryngeal elevation;Reduced airway/laryngeal closure;Penetration/Aspiration before swallow;Penetration/Aspiration during swallow;Penetration/Apiration after swallow;Trace aspiration;Pharyngeal residue - valleculae;Pharyngeal residue - pyriform;Compensatory  strategies attempted (with notebox) Pharyngeal Material enters airway, passes BELOW cords and not ejected out despite cough attempt by patient Pharyngeal- Puree Delayed swallow initiation-vallecula;Reduced pharyngeal peristalsis;Reduced epiglottic inversion;Reduced laryngeal elevation;Reduced airway/laryngeal closure;Pharyngeal residue - valleculae;Pharyngeal residue - pyriform Pharyngeal -- Pharyngeal- Mechanical Soft -- Pharyngeal -- Pharyngeal- Regular -- Pharyngeal -- Pharyngeal- Multi-consistency -- Pharyngeal -- Pharyngeal- Pill -- Pharyngeal -- Pharyngeal Comment --  CHL IP CERVICAL ESOPHAGEAL PHASE 04/02/2018 Cervical Esophageal Phase (No Data) Pudding Teaspoon -- Pudding Cup -- Honey Teaspoon -- Honey Cup -- Nectar Teaspoon -- Nectar Cup -- Nectar Straw -- Thin Teaspoon -- Thin Cup -- Thin Straw -- Puree -- Mechanical Soft -- Regular -- Multi-consistency -- Pill -- Cervical Esophageal Comment -- [name] 04/02/2018, 1:50 PM              Ct Head Code Stroke Wo Contrast  Result Date: 04/01/2018 CLINICAL DATA:  Code stroke. Code stroke. Difficulty swallowing. Left-sided weakness. Last seen normal 1 hour ago. EXAM: CT HEAD WITHOUT CONTRAST TECHNIQUE: Contiguous axial images were obtained from the base of the skull through the vertex without intravenous contrast. COMPARISON:  CT head without contrast 10/04/2017 FINDINGS: Brain: Atrophy and white matter changes are within normal limits for age. Basal ganglia are intact. No acute infarct, hemorrhage, or mass lesion is present. Insular cortex is within normal limits. No acute or focal cortical abnormality is present ventricles are proportionate to the degree of atrophy. No significant extra-axial fluid collection is present. The brainstem and cerebellum are normal. Vascular: Atherosclerotic calcifications are present the cavernous internal carotid arteries bilaterally and at the dural margin of both vertebral arteries. There is no hyperdense  vessel. Skull: Calvarium is intact. No focal lytic or blastic lesions are present. Sinuses/Orbits: Chronic left maxillary sinus disease present. The paranasal sinuses and mastoid air cells are otherwise clear. Globes and orbits are within normal limits bilaterally. ASPECTS [facility] Stroke Program Early CT Score) - Ganglionic level infarction (caudate, lentiform nuclei, internal capsule, insula, M1-M3 cortex): 7/7 - Supraganglionic infarction (M4-M6 cortex): 3/3 Total score (0-10 with 10 being normal): 10/10 IMPRESSION: 1. Stable atrophy and white matter disease, likely within normal limits for age. 2. No acute intracranial abnormality. 3. Atherosclerosis. 4. ASPECTS is 10/10 The above was relayed via text pager to Dr. Erlinda Hong on 04/01/2018 at 14:49 . Electronically Signed   By: San Morelle M.D.   On: 04/01/2018 14:51    Assessment/Plan 1. Acute interstitial pneumonia (HCC) Afebrile.Chest X-ray results showed interstitial pneumonitis. - Start  on Doxycycyline 100 mg tablet twice daily x 10 days. - florastor 250 mg capsule one by mouth twice daily x 10 days for antibiotic associated pneumonia prevention. - Incenstive spirometer every 6 hours while awake.  - check vital signs every shift x 5 days then resume previous orders.  2. Dysphagia, unspecified type Status post hospital admission 04/01/2018-04/09/2018 post CVA post I.V tPA.Nectar thick liquids switched to Honey thicken by speech therapist.continue to follow up with speech therapist.continue on Aspiration precautions.   3. Dysarthria Status post CVA.Hypophonic.continue to follow up with speech Therapy.   Family/ staff Communication: Reviewed plan of care with patient and facility Nurse.   Labs/tests ordered: None   Cashlyn Huguley C Jacia Sickman, NP

## 2018-04-30 ENCOUNTER — Ambulatory Visit (INDEPENDENT_AMBULATORY_CARE_PROVIDER_SITE_OTHER): Payer: Medicare Other | Admitting: Vascular Surgery

## 2018-04-30 ENCOUNTER — Other Ambulatory Visit: Payer: Self-pay

## 2018-04-30 ENCOUNTER — Encounter: Payer: Self-pay | Admitting: Vascular Surgery

## 2018-04-30 VITALS — BP 100/55 | HR 64 | Resp 16 | Ht 73.0 in | Wt 148.0 lb

## 2018-04-30 DIAGNOSIS — I739 Peripheral vascular disease, unspecified: Secondary | ICD-10-CM | POA: Diagnosis not present

## 2018-04-30 NOTE — Progress Notes (Signed)
Patient name: Nathan Cook MRN: [id number] DOB: Nov 22, 1921 Sex: male  REASON FOR CONSULT: Evaluate for PAD  HPI: Nathan Cook is a 84 y.o. male, with multiple medical problems including diabetes, hyperlipidemia, and recent diagnosis of A. fib, and recent stroke who presents for evaluation of PAD.  Patient states that he was initially endorsing lower extremity claudication that was intermittent and worse in the right leg.  At the time he described a burning in the right calf when walking and always improved when he stopped.  He had lower extremity arterial studies that were done at the end of September that showed no runoff in the right lower extremity with a left SFA occlusion and single-vessel runoff in the left leg.  Unfortunately since that time patient had a stroke and was admitted to the hospital with residual right-sided weakness that required TPA.  He is now in a wheelchair although he states he can walk with a walker and is in therapy.  Overall he appears very deconditioned in clinic today with residual right sided weakness. He says now he has no rest pain in feet at night.  Has small cut to tip of left toe after podiatry visit.   Past Medical History:  Diagnosis Date  . BPH (benign prostatic hyperplasia) 10/14/2014  . Cervicalgia 01/04/2016  . Chronic kidney disease   . Contusion of left shoulder 05/27/14  . DM type 2 (diabetes mellitus, type 2) ([place])   . Dysphagia   . High blood pressure   . History of basal cell cancer 11/21/2005   Right temple  . History of colonic polyps 10/14/2003  . Hyperlipidemia   . Left knee pain 05/27/14  . Peripheral vascular disease ([place])   . SAH (subarachnoid hemorrhage) ([place]) 2009   TBI  . TIA (transient ischemic attack) 11/06/2005   Left eye pain. Slurred speech. Diaphoresis. No residual effects.     Past Surgical History:  Procedure Laterality Date  . Achilles tendon repair Left 1998   ruptured tendon  . COLONOSCOPY  1992   Polyp  removed  . COLONOSCOPY  1998   normal  . ORIF FEMUR FRACTURE Right 1929  . TONSILLECTOMY      Family History  Problem Relation Age of Onset  . Heart attack Mother   . Heart disease Mother   . Heart disease Father     SOCIAL HISTORY: Social History   Socioeconomic History  . Marital status: Married    Spouse name: Not on file  . Number of children: Not on file  . Years of education: Not on file  . Highest education level: Not on file  Occupational History  . Occupation: retired Copy  . Financial resource strain: Not on file  . Food insecurity:    Worry: Not on file    Inability: Not on file  . Transportation needs:    Medical: Not on file    Non-medical: Not on file  Tobacco Use  . Smoking status: Former Smoker    Last attempt to quit: 06/26/1971    Years since quitting: 46.8  . Smokeless tobacco: Never Used  . Tobacco comment: 2 1/2 packs a day, until 1972  Substance and Sexual Activity  . Alcohol use: Yes    Alcohol/week: 1.0 - 2.0 standard drinks    Types: 1 - 2 Glasses of wine per week    Comment: Wine nightly  . Drug use: No  . Sexual activity: Not on file  Lifestyle  . Physical activity:    Days per week: Not on file    Minutes per session: Not on file  . Stress: Not on file  Relationships  . Social connections:    Talks on phone: Not on file    Gets together: Not on file    Attends religious service: Not on file    Active member of club or organization: Not on file    Attends meetings of clubs or organizations: Not on file    Relationship status: Not on file  . Intimate partner violence:    Fear of current or ex partner: Not on file    Emotionally abused: Not on file    Physically abused: Not on file    Forced sexual activity: Not on file  Other Topics Concern  . Not on file  Social History Narrative   Patient lives at [facility] since Dec 2015   Caffeine- Coffee   Married- Yes, 1951   [place] [employer] 1943-46   House-  Apartment with 2 people   Pets- No   Current/past profession- Press photographer   Exercise- Yes, walking   Living will- Yes   DNR- Yes   POA/HPOA- Yes       No Known Allergies  Current Outpatient Medications  Medication Sig Dispense Refill  . acetaminophen (TYLENOL 8 HOUR) 650 MG CR tablet Take 1 tablet (650 mg total) by mouth every 8 (eight) hours as needed for pain. 60 tablet 0  . aspirin EC 81 MG tablet Take 81 mg by mouth daily.    . Calcium Carb-Cholecalciferol (CALCIUM 600+D3 PO) Take 1 tablet by mouth daily.    . Cholecalciferol 1000 UNITS tablet Take 1,000 Units by mouth daily.    Marland Kitchen losartan (COZAAR) 25 MG tablet Take 25 mg by mouth daily.    . metFORMIN (GLUCOPHAGE) 1000 MG tablet TAKE ONE TABLET BY MOUTH EVERY MORNING TO CONTROL DIABETES 90 tablet 0  . metoprolol tartrate (LOPRESSOR) 25 MG tablet Take 1 tablet (25 mg total) by mouth 2 (two) times daily.    Marland Kitchen Propylene Glycol-Glycerin 1-0.3 % SOLN Place 2 drops into both eyes as needed.    . simvastatin (ZOCOR) 10 MG tablet Take 10 mg by mouth daily.    Marland Kitchen zinc oxide 20 % ointment Apply 1 application topically as needed for irritation. To buttocks after every incontinent episode and as needed for redness. May keep at bedside.    . Blood Glucose Monitoring Suppl (ONE TOUCH ULTRA SYSTEM KIT) w/Device KIT Use to test blood sugar twice daily. Dx E11.9 (Patient not taking: Reported on 04/30/2018) 1 each 0  . glucose blood test strip One Touch Ultra Test Strip Sig:Use to test blood sugar twice daily. Dx E11.9 (Patient not taking: Reported on 04/30/2018) 300 each 3  . Lancets (UNILET COMFORTOUCH LANCET) MISC Use to test blood sugar twice daily. Dx: E11.9 (Patient not taking: Reported on 04/30/2018) 300 each 3   No current facility-administered medications for this visit.     REVIEW OF SYSTEMS:  _0  denotes positive finding, _1  denotes negative finding Cardiac  Comments:  Chest pain or chest pressure:    Shortness of breath upon exertion:      Short of breath when lying flat:    Irregular heart rhythm:        Vascular    Pain in calf, thigh, or hip brought on by ambulation: x   Pain in feet at night that wakes you up from your  sleep:     Blood clot in your veins:    Leg swelling:         Pulmonary    Oxygen at home:    Productive cough:     Wheezing:         Neurologic    Sudden weakness in arms or legs:     Sudden numbness in arms or legs:     Sudden onset of difficulty speaking or slurred speech:    Temporary loss of vision in one eye:     Problems with dizziness:         Gastrointestinal    Blood in stool:     Vomited blood:         Genitourinary    Burning when urinating:     Blood in urine:        Psychiatric    Major depression:         Hematologic    Bleeding problems:    Problems with blood clotting too easily:        Skin    Rashes or ulcers:        Constitutional    Fever or chills:      PHYSICAL EXAM: Vitals:   04/30/18 1518  BP: (!) 100/55  Pulse: 64  Resp: 16  SpO2: 96%  Weight: 148 lb (67.1 kg)  Height: _0  (1.854 m)    GENERAL: The patient is a well-nourished male, in no acute distress. The vital signs are documented above. CARDIAC: There is a regular rate and rhythm.  VASCULAR:  2+ left radial pulse, no right radial palpable 2+ femoral palpable bilateral groins No palpable pedal pulses No overt tissue loss, small linear cut to tip of left great toe PULMONARY: There is good air exchange bilaterally without wheezing or rales. ABDOMEN: Soft and non-tender with normal pitched bowel sounds.  MUSCULOSKELETAL: There are no major deformities or cyanosis. NEUROLOGIC: Right sided weakness 3/5. PSYCHIATRIC: The patient has a normal affect.  DATA:   I independently reviewed his noninvasive imaging that shows no tibial runoff in the right lower extremity with likely collateral flow and left SFA occlusion and single-vessel runoff in the left leg.  Assessment/Plan:  84 year old  male with severe PAD as correlated on his recent noninvasive imaging initially referred for claudication.  Unfortunately he was just admitted to the hospital with a stroke and has now residual right-sided deficits and is in a wheelchair and a SNF now.  He states he is working with therapy and trying to use a walker and rehabilitation.  Ultimately he denies any significant rest pain at night in either foot and was mainly endorsing claudication prior to his recent stroke.  He does have a small crack on the left great toe distally that we need to watch closely.  I initially offered him a left lower extremity arteriogram to further evaluate his runoff but in further discussing with the patient he is trying to recover from his stroke and is in therapy and would like to avoid any further interventions for the time being.  Given that his left toe really has no significant tissue loss at this point I think we can do close interval follow-up in 1 month to keep a very close eye on this.  Discussed with the patient's if this worsens he needs to let our office know as soon as possible.    Marty Heck, MD [facility] Office: [phone] Pager: [phone]  Marty Heck

## 2018-05-01 ENCOUNTER — Telehealth: Payer: Self-pay | Admitting: Family

## 2018-05-01 NOTE — Telephone Encounter (Signed)
I left a message asking the patient to call me at [phone] to schedule AWV with Clarise Cruz at [facility] on afternoon of 05/08/18 if available. She will also be there all day on 05/17/18. VDM (DD)

## 2018-05-08 ENCOUNTER — Ambulatory Visit: Payer: Medicare Other | Admitting: Adult Health

## 2018-05-08 ENCOUNTER — Encounter: Payer: Self-pay | Admitting: Adult Health

## 2018-05-08 VITALS — BP 118/62 | HR 70 | Ht 68.0 in | Wt 143.0 lb

## 2018-05-08 DIAGNOSIS — I10 Essential (primary) hypertension: Secondary | ICD-10-CM | POA: Diagnosis not present

## 2018-05-08 DIAGNOSIS — E785 Hyperlipidemia, unspecified: Secondary | ICD-10-CM

## 2018-05-08 DIAGNOSIS — I480 Paroxysmal atrial fibrillation: Secondary | ICD-10-CM | POA: Diagnosis not present

## 2018-05-08 DIAGNOSIS — N183 Chronic kidney disease, stage 3 unspecified: Secondary | ICD-10-CM

## 2018-05-08 DIAGNOSIS — I63412 Cerebral infarction due to embolism of left middle cerebral artery: Secondary | ICD-10-CM

## 2018-05-08 DIAGNOSIS — E1122 Type 2 diabetes mellitus with diabetic chronic kidney disease: Secondary | ICD-10-CM

## 2018-05-08 NOTE — Progress Notes (Signed)
[facility] [address] [place]. [place] [address] [phone]       OFFICE FOLLOW UP NOTE  Nathan Cook Date of Birth:  02/12/1922 Medical Record Number:  [id number]   Reason for Referral:  hospital stroke follow up  CHIEF COMPLAINT:  Chief Complaint  Patient presents with  . 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 History obtained from patient, daughter and chart review. Reviewed all radiology images and labs personally.  Nathan Cook a 84 y.o.malewith history of HTN, HLD, DM, PVD who was admitted for acute onsetdifficulty speech andswallowing,left-sidedataxia.  CT had reviewed  and was negative for acute abnormality therefore TPA was administered.  CTA head and neck showed left VA occlusion at V4 segment and left PICA occlusion.  MRI brain reviewed showed left central medullary infarct along with left posterior temporal small SAH.  Per notes, after retrospective review of CT head, SAH present on initial CT had prior to TPA without evidence of change post TPA.  Repeat CT head on 04/06/2018 showed stable SAH.  Repeat CT had on 04/09/2018 showed stable left medullary subacute infarct with interval dispersion of small volume hemorrhage left temporal lobe and SAH left temporal convexity.  2D echo showed an EF of 60 to 65%.  Patient was found to be in PAF with RVR on telemetry and is recommended to initiate aspirin 81 mg and to start Eliquis in 1 week as long as he remains neurologically stable.  LDL 80 and recommended increasing simvastatin from 10 mg to 20 mg.  HTN stable during admission and recommended BP goal normotensive range.  A1c 7.6 and recommended tight  glycemic control with close PCP follow-up for continued DM management.  Therapies recommended discharge to SNF due to continued deficits and was discharged in stable condition.  Patient is being seen today for hospital follow-up and is accompanied by his daughter.  He does continue to have dysarthria but has been improving.  He no longer has difficulty with swallowing and is currently on a regular diet.  He continues to participate in PT/OT/ST at [facility].  He was previously residing at [facility] and plans on returning once he is stable.  He is currently using wheelchair but is able to ambulate during PT sessions with rolling walker due to continued right-sided ataxia.  He has been started on Eliquis and denies bleeding or bruising.  Continues simvastatin without side effects myalgias.  Blood pressure today satisfactory 118/62.  Glucose levels have been fluctuating but typically are not greater than 140.  No further concerns at this time.  Denies new or worsening stroke/TIA symptoms.   ROS:   14 system review of systems performed and negative with exception of fatigue, ringing in ears, runny nose, eye discharge, cough, leg swelling, murmur, dizziness and speech difficulty  PMH:  Past Medical History:  Diagnosis Date  . BPH (benign prostatic hyperplasia) 10/14/2014  . Cervicalgia 01/04/2016  . Chronic kidney disease   . Contusion of left shoulder 05/27/14  . DM type 2 (diabetes mellitus, type 2) ([place])   . Dysphagia   . High blood pressure   . History of basal cell  cancer 11/21/2005   Right temple  . History of colonic polyps 10/14/2003  . Hyperlipidemia   . Left knee pain 05/27/14  . Peripheral vascular disease ([place])   . SAH (subarachnoid hemorrhage) ([place]) 2009   TBI  . Stroke ([place])   . TIA (transient ischemic attack) 11/06/2005   Left eye pain. Slurred speech. Diaphoresis. No residual effects.     PSH:  Past Surgical History:  Procedure Laterality Date  .  Achilles tendon repair Left 1998   ruptured tendon  . COLONOSCOPY  1992   Polyp removed  . COLONOSCOPY  1998   normal  . ORIF FEMUR FRACTURE Right 1929  . TONSILLECTOMY      Social History:  Social History   Socioeconomic History  . Marital status: Married    Spouse name: Not on file  . Number of children: Not on file  . Years of education: Not on file  . Highest education level: Not on file  Occupational History  . Occupation: retired Copy  . Financial resource strain: Not on file  . Food insecurity:    Worry: Not on file    Inability: Not on file  . Transportation needs:    Medical: Not on file    Non-medical: Not on file  Tobacco Use  . Smoking status: Former Smoker    Last attempt to quit: 06/26/1971    Years since quitting: 46.8  . Smokeless tobacco: Never Used  . Tobacco comment: 2 1/2 packs a day, until 1972  Substance and Sexual Activity  . Alcohol use: Yes    Alcohol/week: 1.0 - 2.0 standard drinks    Types: 1 - 2 Glasses of wine per week    Comment: Wine nightly  . Drug use: No  . Sexual activity: Not on file  Lifestyle  . Physical activity:    Days per week: Not on file    Minutes per session: Not on file  . Stress: Not on file  Relationships  . Social connections:    Talks on phone: Not on file    Gets together: Not on file    Attends religious service: Not on file    Active member of club or organization: Not on file    Attends meetings of clubs or organizations: Not on file    Relationship status: Not on file  . Intimate partner violence:    Fear of current or ex partner: Not on file    Emotionally abused: Not on file    Physically abused: Not on file    Forced sexual activity: Not on file  Other Topics Concern  . Not on file  Social History Narrative   Patient lives at [facility] since Dec 2015   Caffeine- Coffee   Nathan Cook   [place] [employer] 1941-[employer]   House- Apartment with 2 people   Pets- No    Current/past profession- Press photographer   Exercise- Yes, walking   Living will- Yes   DNR- Yes   POA/HPOA- Yes       Family History:  Family History  Problem Relation Age of Onset  . Heart attack Mother   . Heart disease Mother   . Heart disease Father     Medications:   Current Outpatient Medications on File Prior to Visit  Medication Sig Dispense Refill  . acetaminophen (TYLENOL 8 HOUR) 650 MG CR tablet Take 1 tablet (650 mg total) by mouth Cook 8 (eight) hours as needed for  pain. 60 tablet 0  . apixaban (ELIQUIS) 2.5 MG TABS tablet Take 2.5 mg by mouth 2 (two) times daily.    . Calcium Carb-Cholecalciferol (CALCIUM 600+D3 PO) Take 1 tablet by mouth daily.    . Cholecalciferol 1000 UNITS tablet Take 1,000 Units by mouth daily.    Marland Kitchen losartan (COZAAR) 25 MG tablet Take 25 mg by mouth daily.    . metFORMIN (GLUCOPHAGE) 1000 MG tablet TAKE ONE TABLET BY MOUTH Cook MORNING TO CONTROL DIABETES 90 tablet 0  . metoprolol tartrate (LOPRESSOR) 25 MG tablet Take 1 tablet (25 mg total) by mouth 2 (two) times daily.    Marland Kitchen Propylene Glycol-Glycerin 1-0.3 % SOLN Place 2 drops into both eyes as needed.    . simvastatin (ZOCOR) 10 MG tablet Take 10 mg by mouth daily.    Marland Kitchen zinc oxide 20 % ointment Apply 1 application topically as needed for irritation. To buttocks after Cook incontinent episode and as needed for redness. May keep at bedside.    Marland Kitchen losartan (COZAAR) 50 MG tablet      No current facility-administered medications on file prior to visit.     Allergies:  No Known Allergies   Physical Exam  Vitals:   05/08/18 0937  BP: 118/62  Pulse: 70  Weight: 143 lb (64.9 kg)  Height: 5\' 8"  (1.727 m)   Body mass index is 21.74 kg/m. No exam data present  General: well developed, well nourished, pleasant elderly Caucasian male, seated, in no evident distress Head: head normocephalic and atraumatic.   Neck: supple with no carotid or supraclavicular bruits Cardiovascular: regular rate and  rhythm, no murmurs Musculoskeletal: no deformity Skin:  no rash/petichiae Vascular:  Normal pulses all extremities  Neurologic Exam Mental Status: Awake and fully alert.  Mild dysarthria.  Oriented to place and time. Recent and remote memory intact. Attention span, concentration and fund of knowledge appropriate. Mood and affect appropriate.  Cranial Nerves: Fundoscopic exam reveals sharp disc margins. Pupils equal, briskly reactive to light. Extraocular movements full without nystagmus. Visual fields full to confrontation. Hearing intact. Facial sensation intact. Face, tongue, palate moves normally and symmetrically.  Motor: Normal bulk and tone. Normal strength in all tested extremity muscles. Sensory.: intact to touch , pinprick , position and vibratory sensation.  Coordination: Ataxia present in right upper and lower extremity Gait and Station: Patient is currently sitting in wheelchair and as he does not have rolling walker present with appointment, gait assessment deferred Reflexes: 1+ and symmetric. Toes downgoing.    NIHSS  2 Modified Rankin  3 CHA2DS2-VASc 6 HAS-BLED 2   Diagnostic Data (Labs, Imaging, Testing)  CT HEAD WO CONTRAST 04/01/2018 IMPRESSION: 1. Stable atrophy and white matter disease, likely within normal limits for age. 2. No acute intracranial abnormality. 3. Atherosclerosis. 4. ASPECTS is 10/10  CT ANGIO HEAD W OR WO CONTRAST CT ANGIO NECK W OR WO CONTRAST 04/01/2018 IMPRESSION: Mild atherosclerotic disease in the carotid bifurcation bilaterally. Atherosclerotic calcification and mild stenosis in the cavernous carotid bilaterally.  Moderate stenosis at the origin of the vertebral artery bilaterally. Occlusion distal left vertebral artery at the C1 level. Left PICA occluded. This could be acute or chronic. Correlate with neurologic examination and MRI.  CT HEAD WO CONTRAST 04/02/2018 24 hours post TPA IMPRESSION: 1. Small volume subarachnoid  hemorrhage along the left temporoparietal convexity, in retrospect stable from yesterday. 2. No visible acute infarct.  MR BRAIN WO CONTRAST 04/02/2018 IMPRESSION: Acute LEFT medullary brainstem infarct correlating with recent LEFT vertebral  and PICA occlusion. LEFT posterior temporal subarachnoid hemorrhage, uncertain etiology, appears to be increased volume from original CT, perhaps tPA related. Atrophy with chronic microvascular ischemic change.  CT HEAD WO CONTRAST 04/09/2018 IMPRESSION: 1. Stable left medulla subacute infarction. 2. Interval partial dispersion of small volume of hemorrhage within the left temporal lobe and subarachnoid hemorrhage over the left temporal convexity. 3. No new acute intracranial abnormality. 4. Stable background of chronic microvascular ischemic changes and volume loss of the brain.  ECHOCARDIOGRAM 04/02/2018 Study Conclusions - Left ventricle: The cavity size was normal. Wall thickness was   normal. Systolic function was normal. The estimated ejection   fraction was in the range of 60% to 65%. Wall motion was normal;   there were no regional wall motion abnormalities. Doppler   parameters are consistent with abnormal left ventricular   relaxation (grade 1 diastolic dysfunction). - Aortic valve: Valve mobility was restricted. There was moderate   stenosis. There was mild regurgitation. Valve area (VTI): 1.04   cm^2. Valve area (Vmax): 0.82 cm^2. Valve area (Vmean): 0.99   cm^2.   ASSESSMENT: NIKOS ANGLEMYER is a 84 y.o. 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 Vascular risk factors include HTN, HLD, DM, PVD and newly diagnosed AF.  Patient is being seen today for hospital follow-up and does continue to have dysarthria and right upper and lower ataxia but overall has been improving and denies any new or worsening stroke/TIA  symptoms.    PLAN:  1. Left central medullary infarct: Continue Eliquis (apixaban) daily  and simvastatin for secondary stroke prevention.  Advised to continue current therapies for continued deficits.  Maintain strict control of hypertension with blood pressure goal below 130/90, diabetes with hemoglobin A1c goal below 6.5% and cholesterol with LDL cholesterol (bad cholesterol) goal below 70 mg/dL.  I also advised the patient to eat a healthy diet with plenty of whole grains, cereals, fruits and vegetables, exercise regularly with at least 30 minutes of continuous activity daily and maintain ideal body weight.  2. PAF: Continue Eliquis.  Follow-up appointment scheduled with Dr. Harrell Gave on 05/13/2018 for PAF and [facility] management 3.   HTN: Advised to continue current treatment regimen.  Today's BP 118/62.  Advised to continue to monitor at home       along with continued follow-up with PCP for management 4.   HLD: Advised to continue current treatment regimen along with continued follow-up with PCP for future prescribing       and monitoring of lipid panel 5.   DMII: Advised to continue to monitor glucose levels at home along with continued follow-up with PCP for                   management and monitoring    Follow up in 3 months or call earlier if needed   Greater than 50% of time during this 25 minute visit was spent on counseling, explanation of diagnosis of left central medullary infarct, reviewing risk factor management of PAF on AC, HTN, HLD, DM, and PVD, planning of further management along with potential future management, and discussion with patient and family answering all questions.    Venancio Poisson, AGNP-BC  [facility] [address] [place], [place] [address]  Phone [phone] Fax [phone] Note: This document was prepared with digital dictation and possible smart phrase technology. Any transcriptional errors that result from this  process  are unintentional.

## 2018-05-08 NOTE — Patient Instructions (Signed)
Continue Eliquis (apixaban) daily  and simvastatin  for secondary stroke prevention  Continue to follow up with PCP regarding cholesterol, diabetes and blood pressure management    Continue therapies for continued weakness and improvement   Follow up with cardiology next week for atrial fibrillation management  Follow up with vascular surgery as scheduled  Continue to monitor blood pressure at home  Maintain strict control of hypertension with blood pressure goal below 130/90, diabetes with hemoglobin A1c goal below 6.5% and cholesterol with LDL cholesterol (bad cholesterol) goal below 70 mg/dL. I also advised the patient to eat a healthy diet with plenty of whole grains, cereals, fruits and vegetables, exercise regularly and maintain ideal body weight.  Followup in the future with me in 3 months or call earlier if needed       Thank you for coming to see [place] at [facility]. I hope we have been able to provide you high quality care today.  You may receive a patient satisfaction survey over the next few weeks. We would appreciate your feedback and comments so that we may continue to improve ourselves and the health of our patients.

## 2018-05-10 ENCOUNTER — Non-Acute Institutional Stay (SKILLED_NURSING_FACILITY): Payer: Medicare Other | Admitting: Family

## 2018-05-10 ENCOUNTER — Encounter: Payer: Self-pay | Admitting: Family

## 2018-05-10 DIAGNOSIS — N183 Chronic kidney disease, stage 3 unspecified: Secondary | ICD-10-CM

## 2018-05-10 DIAGNOSIS — R1310 Dysphagia, unspecified: Secondary | ICD-10-CM

## 2018-05-10 DIAGNOSIS — I129 Hypertensive chronic kidney disease with stage 1 through stage 4 chronic kidney disease, or unspecified chronic kidney disease: Secondary | ICD-10-CM

## 2018-05-10 DIAGNOSIS — I480 Paroxysmal atrial fibrillation: Secondary | ICD-10-CM

## 2018-05-10 DIAGNOSIS — E785 Hyperlipidemia, unspecified: Secondary | ICD-10-CM

## 2018-05-10 DIAGNOSIS — R471 Dysarthria and anarthria: Secondary | ICD-10-CM

## 2018-05-10 DIAGNOSIS — I739 Peripheral vascular disease, unspecified: Secondary | ICD-10-CM

## 2018-05-10 DIAGNOSIS — E1122 Type 2 diabetes mellitus with diabetic chronic kidney disease: Secondary | ICD-10-CM

## 2018-05-10 DIAGNOSIS — R2681 Unsteadiness on feet: Secondary | ICD-10-CM

## 2018-05-10 DIAGNOSIS — Z8673 Personal history of transient ischemic attack (TIA), and cerebral infarction without residual deficits: Secondary | ICD-10-CM

## 2018-05-10 NOTE — Progress Notes (Addendum)
Location:  [place] Room Number: [place] of Service:  SNF [id number]) Provider: Adisyn Ruscitti FNP-C   Wardell Honour, MD  Patient Care Team: Wardell Honour, MD as PCP - General (Family Medicine) Buford Dresser, MD as PCP - Cardiology (Cardiology)  Extended Emergency Contact Information Primary Emergency Contact: [place] Address: [address]          [place], [place] [address] [place] Phone: [phone] Relation: Daughter  Code Status: Full Code  Goals of care: Advanced Directive information Advanced Directives 05/10/2018  Does Patient Have a Medical Advance Directive? Yes  Type of Paramedic of [place];Living will  Does patient want to make changes to medical advance directive? No - Patient declined  Copy of [place] in Chart? Yes - validated most recent copy scanned in chart (See row information)     Chief Complaint  Patient presents with  . Medical Management of Chronic Issues    Routine Visit    HPI:  Pt is a 84 y.o. male seen today [place] for medical management of chronic diseases.He has a medical history of HTN,Afib,Hyperlipidemia,CKD stage 3,PAD,Type 2 DM among other conditions.He was seen by [facility] 05/08/2018 for follow up recent hospital admission for CVA.Patient to continue with current therapy for right side weakness and follow up with Neurology in 3 months.He has an up coming appointment with Cardiology 05/13/2018.He states right side weakness has improved.He tells me he  walked with physical therapy all the way up to his apartment with walker and gait belt.He states right leg usually "gives out " but that has improved.He was started on regular diet yesterday enjoyed a Electronics engineer and fries.Speech therapist spoke with provider states will try patient on thin liquids today but currently on Nectar thick liquids.His dysarthria has  improved.He also states urine and bowel incontinence has resolved since discharge from the hospital.he has been able to use the bathroom without any difficulties.  He complains of left great toe tenderness states had a small area that was nipped by podiatrist when trimming ingrown toenails.He denies any drainage from site.No fever or chills reported.Tenderness aggravated by his shoes.He will try wearing open shoes till toe heals.  His blood pressure reviewed remains stable.CBG readings controlled ranging in the 130's-190's.No signs of hypo/hyperglycemia reported. His weight remains stable.  Addendum:  Physical Therapist request order for Rolator to patient to maintain current level of activity of daily living with plans to discharge back to independent living facility.  Past Medical History:  Diagnosis Date  . BPH (benign prostatic hyperplasia) 10/14/2014  . Cervicalgia 01/04/2016  . Chronic kidney disease   . Contusion of left shoulder 05/27/14  . DM type 2 (diabetes mellitus, type 2) ([place])   . Dysphagia   . High blood pressure   . History of basal cell cancer 11/21/2005   Right temple  . History of colonic polyps 10/14/2003  . Hyperlipidemia   . Left knee pain 05/27/14  . Peripheral vascular disease ([place])   . SAH (subarachnoid hemorrhage) ([place]) 2009   TBI  . Stroke ([place])   . TIA (transient ischemic attack) 11/06/2005   Left eye pain. Slurred speech. Diaphoresis. No residual effects.    Past Surgical History:  Procedure Laterality Date  . Achilles tendon repair Left 1998   ruptured tendon  . COLONOSCOPY  1992   Polyp removed  . COLONOSCOPY  1998   normal  . ORIF FEMUR FRACTURE Right  1929  . TONSILLECTOMY      No Known Allergies  Allergies as of 05/10/2018   No Known Allergies     Medication List        Accurate as of 05/10/18  3:45 PM. Always use your most recent med list.          acetaminophen 650 MG CR tablet Commonly known as:  TYLENOL Take 1 tablet (650 mg  total) by mouth every 8 (eight) hours as needed for pain.   apixaban 2.5 MG Tabs tablet Commonly known as:  ELIQUIS Take 2.5 mg by mouth 2 (two) times daily.   CALCIUM 600+D3 PO Take 1 tablet by mouth daily.   Cholecalciferol 25 MCG (1000 UT) tablet Take 1,000 Units by mouth daily.   losartan 25 MG tablet Commonly known as:  COZAAR Take 25 mg by mouth 2 (two) times daily.   metFORMIN 500 MG tablet Commonly known as:  GLUCOPHAGE Take 500 mg by mouth 2 (two) times daily with a meal.   metoprolol tartrate 25 MG tablet Commonly known as:  LOPRESSOR Take 1 tablet (25 mg total) by mouth 2 (two) times daily.   Propylene Glycol-Glycerin 1-0.3 % Soln Place 2 drops into both eyes as needed.   simvastatin 10 MG tablet Commonly known as:  ZOCOR Take 10 mg by mouth daily.   zinc oxide 20 % ointment Apply 1 application topically as needed for irritation. To buttocks after every incontinent episode and as needed for redness. May keep at bedside.       Review of Systems  Constitutional: Negative for appetite change, chills, fatigue, fever and unexpected weight change.  HENT: Positive for trouble swallowing. Negative for congestion, rhinorrhea, sinus pressure, sinus pain, sneezing and sore throat.        Dysarthria has improved   Eyes: Positive for visual disturbance. Negative for discharge, redness and itching.       Wears eye glasses   Respiratory: Negative for cough, chest tightness, shortness of breath and wheezing.   Cardiovascular: Negative for chest pain, palpitations and leg swelling.  Gastrointestinal: Negative for abdominal distention, abdominal pain, constipation, diarrhea, nausea and vomiting.  Endocrine: Negative for cold intolerance, heat intolerance, polydipsia, polyphagia and polyuria.  Genitourinary: Negative for dysuria, flank pain, frequency and urgency.  Musculoskeletal: Positive for gait problem.       Right side weakness   Skin: Negative for color change,  pallor, rash and wound.  Neurological: Negative for dizziness, light-headedness and headaches.       Chronic Numbness and tingling of fingers has improved with therapy   Hematological: Does not bruise/bleed easily.  Psychiatric/Behavioral: Negative for agitation, confusion and sleep disturbance. The patient is not nervous/anxious.     Immunization History  Administered Date(s) Administered  . DTaP 12/31/2012  . Influenza, High Dose Seasonal PF 04/04/2017  . Influenza,inj,Quad PF,6+ Mos 03/28/2018  . Influenza-Unspecified 04/10/2014, 03/25/2015, 04/06/2016  . PPD Test 04/29/2014  . Pneumococcal Conjugate-13 04/28/2017  . Pneumococcal-Unspecified 03/13/2011  . Tdap 04/28/2017  . Zoster Recombinat (Shingrix) 12/14/2017   Pertinent  Health Maintenance Due  Topic Date Due  . OPHTHALMOLOGY EXAM  07/07/2017  . FOOT EXAM  06/27/2018  . HEMOGLOBIN A1C  10/02/2018  . INFLUENZA VACCINE  Completed  . PNA vac Low Risk Adult  Completed   Fall Risk  05/08/2018 04/27/2017 03/21/2017 09/07/2015 09/08/2014  Falls in the past year? 0 No No No Yes  Number falls in past yr: - - - - 1  Injury with  Fall? - - - - No  Risk for fall due to : - - Medication side effect - -   Functional Status Survey:    Vitals:   05/10/18 0957  BP: 110/66  Pulse: 72  Resp: 20  Temp: (!) 97 F (36.1 C)  TempSrc: Oral  SpO2: 95%  Weight: 143 lb 1.6 oz (64.9 kg)  Height: 6\' 1"  (1.854 m)   Body mass index is 18.88 kg/m. Physical Exam  Constitutional: He is oriented to person, place, and time. He appears well-developed and well-nourished. No distress.  Elderly   HENT:  Head: Normocephalic.  Right Ear: External ear normal.  Left Ear: External ear normal.  Mouth/Throat: Oropharynx is clear and moist. No oropharyngeal exudate.  Eyes: Pupils are equal, round, and reactive to light. Conjunctivae and EOM are normal. Right eye exhibits no discharge. Left eye exhibits no discharge. No scleral icterus.  Corrective lens  in place   Neck: Normal range of motion. No JVD present. No thyromegaly present.  Cardiovascular: Intact distal pulses. Exam reveals no gallop and no friction rub.  Murmur heard. Pulmonary/Chest: Effort normal and breath sounds normal. No respiratory distress. He has no wheezes. He has no rales.  Abdominal: Soft. Bowel sounds are normal. He exhibits no distension and no mass. There is no tenderness. There is no rebound and no guarding.  Musculoskeletal: He exhibits no edema or tenderness.  Moves x 4 extremities without any difficulties.unsteady gait self propel on wheelchair and uses walker with assist /gait belt.   Lymphadenopathy:    He has no cervical adenopathy.  Neurological: He is oriented to person, place, and time. Gait abnormal.  Dysarthria has improved compared to previous visit.Left hand grip slightly weaker than right.   Skin: Skin is warm and dry. Capillary refill takes 2 to 3 seconds. No rash noted. No pallor.  Left great toe tip with small dark in color linear scab area toe skin with chronic blanchable redness and slight tender to touch at the tip.No drainage,swelling or hot to touch.  Psychiatric: He has a normal mood and affect. His behavior is normal. Judgment and thought content normal.  Speech impaired though has improved compared to previous visit post CVA.   Nursing note and vitals reviewed.   Labs reviewed: Recent Labs    04/03/18 0304 04/04/18 0426 04/06/18 0348  NA 136 138 141  K 3.7 3.8 3.7  CL 105 105 110  CO2 22 18* 24  GLUCOSE 148* 156* 141*  BUN 28* 28* 24*  CREATININE 1.36* 1.30* 1.10  CALCIUM 8.6* 8.4* 8.4*   Recent Labs    09/10/17 0750 03/13/18 0000 04/01/18 1432  AST 18 24 25   ALT 21 26 19   ALKPHOS  --   --  57  BILITOT 0.7 0.9 0.9  PROT 6.5 6.6 6.6  ALBUMIN  --   --  3.7   Recent Labs    04/01/18 1432  04/03/18 0304 04/04/18 0426 04/06/18 0348  WBC 8.9   < > 24.0* 15.2* 12.4*  NEUTROABS 6.3  --   --   --   --   HGB 12.7*   < >  12.0* 13.3 13.5  HCT 38.4*   < > 36.2* 40.0 40.5  MCV 104.9*   < > 100.3* 101.8* 102.0*  PLT 317   < > 365 338 336   < > = values in this interval not displayed.   No results found for: TSH Lab Results  Component Value Date  HGBA1C 7.6 (H) 04/02/2018   Lab Results  Component Value Date   CHOL 143 04/02/2018   HDL 57 04/02/2018   LDLCALC 80 04/02/2018   TRIG 29 04/02/2018   CHOLHDL 2.5 04/02/2018    Significant Diagnostic Results in last 30 days:  No results found.  Assessment/Plan 1. Benign hypertension with chronic kidney disease, stage III (HCC) B/p reviewed stable.continue on metoprolol tartrate 25 mg tablet twice daily and Losartan 25 mg tablet twice daily.on simvastatin and EliQuis for cardiovascular event prevention. Continue to monitor BMP.   2. Controlled type 2 diabetes mellitus with stage 3 chronic kidney disease, without long-term current use of insulin (HCC) Lab Results  Component Value Date   HGBA1C 7.6 (H) 04/02/2018   CBG reviewed controlled.continue on metformin 500 mg tablet twice daily. Up to date with annual foot exam.will follow up with ophthalmology upon discharge from rehab.  3. Dysarthria Much improvement noted.continue to work with speech therapy.   4. Dysphagia, unspecified type Has been advanced to regular diet without any difficulties.currently on Nectar thicken liquids spoke with speech therapist during visit plans to try thin liquids later today.continue with Aspiration precautions.   5. Hyperlipidemia LDL goal <70 Lab Results  Component Value Date   CHOL 143 04/02/2018   HDL 57 04/02/2018   LDLCALC 80 04/02/2018   TRIG 29 04/02/2018   CHOLHDL 2.5 04/02/2018  Continue on simvastatin 10 mg tablet daily.Monitor fasting lipid panel. Continue on heart healthy diet and exercise.   6. Unsteady gait No recent fall episode.self propels on wheelchair and uses walker with assistance/gait belt.Physical therapist request order for Rolator to help   patient to maintain current level of activity of daily living.Script written for [employer] and given to Nurse.Right leg weakness has improved.continue with Physical and occupation therapy.Fall and safety precautions.   7. Paroxysmal atrial fibrillation (HCC) HR controlled.continue on EliQuis 2.5 mg tablet twice daily and simvastatin 10 mg tablet daily.Follow up with cardiology as directed.    8. PAD (peripheral artery disease) ([place]) Has varicose veins.Tip of left great toe with small linear dark scab slight tender to touch aggravated with shoes.Has chronic blanchable redness on toe.No signs of infections noted.Facility Nurse to cleanse tip of toe with saline,pat dry and paint with betadine twice daily until scab resolve.monitor for signs/symptoms of infections.continue to follow up with vascular and podiatrist.On EliQuis and Statin.  9. Hx of TIA Right side weakness has improved.continue on Eliquis and Statin.Rolator as above for ambulation.   Family/ staff Communication: Reviewed plan of care with patient and facility Nurse.   Labs/tests ordered: None   Buddie Marston C [place], NP

## 2018-05-11 NOTE — Progress Notes (Signed)
I agree with the above plan 

## 2018-05-13 ENCOUNTER — Ambulatory Visit (INDEPENDENT_AMBULATORY_CARE_PROVIDER_SITE_OTHER): Payer: Medicare Other | Admitting: Cardiology

## 2018-05-13 ENCOUNTER — Encounter: Payer: Self-pay | Admitting: Cardiology

## 2018-05-13 VITALS — BP 133/67 | HR 55 | Ht 68.0 in | Wt 143.8 lb

## 2018-05-13 DIAGNOSIS — Z8673 Personal history of transient ischemic attack (TIA), and cerebral infarction without residual deficits: Secondary | ICD-10-CM | POA: Diagnosis not present

## 2018-05-13 DIAGNOSIS — I480 Paroxysmal atrial fibrillation: Secondary | ICD-10-CM

## 2018-05-13 DIAGNOSIS — I739 Peripheral vascular disease, unspecified: Secondary | ICD-10-CM

## 2018-05-13 DIAGNOSIS — E785 Hyperlipidemia, unspecified: Secondary | ICD-10-CM | POA: Diagnosis not present

## 2018-05-13 NOTE — Progress Notes (Signed)
Cardiology Office Note:    Date:  05/13/2018   ID:  Nathan Cook, DOB 08/04/1921, MRN [id number]  PCP:  Wardell Honour, MD  Cardiologist:  Buford Dresser, MD PhD  Referring MD: Blanchie Serve, MD   CC: post hospital follow up  History of Present Illness:    Nathan Cook is a 84 y.o. male with a hx of HTN, HLD, type II diabetes, peripheral vascular disease, recent CVA s/p tPA who is seen for hospital follow up at the request of Blanchie Serve, MD for the evaluation and management of atrial fibrillation.  He is here with his daughter today. Doing very well. Getting physical therapy for recovery. He still has hoarseness to his voice, but he otherwise reports good recovery. He is tolerating both the apixaban and metoprolol very well, without significant bleeding issues. No syncope. No chest pain, PND, orthopnea. Does have chronic trace LE edema. He notes that after a recent nail cutting, he had pain in the left great toe. It has improved with treating with topical medications. No fevers/chills. No rest pain. No claudication, but walking is limited. Has intermittent LE edema, worse at the end of the day, better with elevation.  He sometimes gets lower chest/upper abdomen "twinges" that are better with belching or passing gas, mild.  Past Medical History:  Diagnosis Date  . BPH (benign prostatic hyperplasia) 10/14/2014  . Cervicalgia 01/04/2016  . Chronic kidney disease   . Contusion of left shoulder 05/27/14  . DM type 2 (diabetes mellitus, type 2) ([place])   . Dysphagia   . High blood pressure   . History of basal cell cancer 11/21/2005   Right temple  . History of colonic polyps 10/14/2003  . Hyperlipidemia   . Left knee pain 05/27/14  . Peripheral vascular disease ([place])   . SAH (subarachnoid hemorrhage) ([place]) 2009   TBI  . Stroke ([place])   . TIA (transient ischemic attack) 11/06/2005   Left eye pain. Slurred speech. Diaphoresis. No residual effects.     Past Surgical  History:  Procedure Laterality Date  . Achilles tendon repair Left 1998   ruptured tendon  . COLONOSCOPY  1992   Polyp removed  . COLONOSCOPY  1998   normal  . ORIF FEMUR FRACTURE Right 1929  . TONSILLECTOMY      Current Medications: Current Outpatient Medications on File Prior to Visit  Medication Sig  . acetaminophen (TYLENOL 8 HOUR) 650 MG CR tablet Take 1 tablet (650 mg total) by mouth every 8 (eight) hours as needed for pain.  Marland Kitchen apixaban (ELIQUIS) 2.5 MG TABS tablet Take 2.5 mg by mouth 2 (two) times daily.  . Calcium Carb-Cholecalciferol (CALCIUM 600+D3 PO) Take 1 tablet by mouth daily.  . Cholecalciferol 1000 UNITS tablet Take 1,000 Units by mouth daily.  Marland Kitchen losartan (COZAAR) 25 MG tablet Take 25 mg by mouth 2 (two) times daily.   . metFORMIN (GLUCOPHAGE) 500 MG tablet Take 500 mg by mouth 2 (two) times daily with a meal.  . metoprolol tartrate (LOPRESSOR) 25 MG tablet Take 1 tablet (25 mg total) by mouth 2 (two) times daily.  Marland Kitchen Propylene Glycol-Glycerin 1-0.3 % SOLN Place 2 drops into both eyes as needed.  . simvastatin (ZOCOR) 10 MG tablet Take 10 mg by mouth daily.  Marland Kitchen zinc oxide 20 % ointment Apply 1 application topically as needed for irritation. To buttocks after every incontinent episode and as needed for redness. May keep at bedside.   No current facility-administered medications  on file prior to visit.      Allergies:   Patient has no known allergies.   Social History   Socioeconomic History  . Marital status: Married    Spouse name: Not on file  . Number of children: Not on file  . Years of education: Not on file  . Highest education level: Not on file  Occupational History  . Occupation: retired Copy  . Financial resource strain: Not on file  . Food insecurity:    Worry: Not on file    Inability: Not on file  . Transportation needs:    Medical: Not on file    Non-medical: Not on file  Tobacco Use  . Smoking status: Former Smoker     Last attempt to quit: 06/26/1971    Years since quitting: 46.9  . Smokeless tobacco: Never Used  . Tobacco comment: 2 1/2 packs a day, until 1972  Substance and Sexual Activity  . Alcohol use: Yes    Alcohol/week: 1.0 - 2.0 standard drinks    Types: 1 - 2 Glasses of wine per week    Comment: Wine nightly  . Drug use: No  . Sexual activity: Not on file  Lifestyle  . Physical activity:    Days per week: Not on file    Minutes per session: Not on file  . Stress: Not on file  Relationships  . Social connections:    Talks on phone: Not on file    Gets together: Not on file    Attends religious service: Not on file    Active member of club or organization: Not on file    Attends meetings of clubs or organizations: Not on file    Relationship status: Not on file  Other Topics Concern  . Not on file  Social History Narrative   Patient lives at [facility] since Dec 2015   Caffeine- Coffee   Married- Yes, 1951   [employer] [employer] 1943-46   House- Apartment with 2 people   Pets- No   Current/past profession- Press photographer   Exercise- Yes, walking   Living will- Yes   DNR- Yes   POA/HPOA- Yes        Family History: The patient's family history includes Heart attack in his mother; Heart disease in his father and mother.  ROS:   Please see the history of present illness.  Additional pertinent ROS:  Constitutional: Negative for chills, fever, night sweats, unintentional weight loss  HENT: Negative for ear pain and hearing loss.   Eyes: Negative for loss of vision and eye pain.  Respiratory: Negative for cough, sputum, shortness of breath, wheezing.   Cardiovascular: Negative for chest pain, palpitations, PND, orthopnea, lower extremity edema and claudication.  Gastrointestinal: Negative for abdominal pain, melena, and hematochezia.  Genitourinary: Negative for dysuria and hematuria.  Musculoskeletal: Negative for falls and myalgias.  Skin: Negative for itching and rash.  Neurological:  Negative for focal weakness, focal sensory changes and loss of consciousness. Positive for hoarseness Endo/Heme/Allergies: Does not bruise/bleed easily.    EKGs/Labs/Other Studies Reviewed:    The following studies were reviewed today: Echo 04/02/18 Study Conclusions  - Left ventricle: The cavity size was normal. Wall thickness was   normal. Systolic function was normal. The estimated ejection   fraction was in the range of 60% to 65%. Wall motion was normal;   there were no regional wall motion abnormalities. Doppler   parameters are consistent with abnormal left ventricular  relaxation (grade 1 diastolic dysfunction). - Aortic valve: Valve mobility was restricted. There was moderate   stenosis. There was mild regurgitation. Valve area (VTI): 1.04   cm^2. Valve area (Vmax): 0.82 cm^2. Valve area (Vmean): 0.99   cm^2.  Impressions:  - Normal LV systolic function; mild diastolic dysfunction;   calcified aortic valve with moderate AS (mean gradient 21 mmHg)   and mild AI.  EKG:  EKG is personally reviewed.  The ekg ordered today demonstrates sinus bradycardia with RBBB and 1st degree AV block. There are diffuse T wave inversions  Recent Labs: 04/01/2018: ALT 19 04/06/2018: BUN 24; Creatinine, Ser 1.10; Hemoglobin 13.5; Platelets 336; Potassium 3.7; Sodium 141  Recent Lipid Panel    Component Value Date/Time   CHOL 143 04/02/2018 0259   TRIG 29 04/02/2018 0259   HDL 57 04/02/2018 0259   CHOLHDL 2.5 04/02/2018 0259   VLDL 6 04/02/2018 0259   LDLCALC 80 04/02/2018 0259   LDLCALC 78 03/13/2018 0000    Physical Exam:    VS:  BP 133/67   Pulse (!) 55   Ht 5\' 8"  (1.727 m)   Wt 143 lb 12.8 oz (65.2 kg)   BMI 21.86 kg/m     Wt Readings from Last 3 Encounters:  05/13/18 143 lb 12.8 oz (65.2 kg)  05/10/18 143 lb 1.6 oz (64.9 kg)  05/08/18 143 lb (64.9 kg)     GEN: Well nourished, well developed in no acute distress HEENT: Normal NECK: No JVD; No carotid  bruits LYMPHATICS: No lymphadenopathy CARDIAC: regular rhythm, normal S1 and S2, no rubs, gallops. 3/6 early to mid peaking SEM with preserved S2. Radial pulses 2+ bilaterally. RESPIRATORY:  Clear to auscultation without rales, wheezing or rhonchi  ABDOMEN: Soft, non-tender, non-distended MUSCULOSKELETAL:  Trace edema; left great toe with duskiness mid toe to tip, area of darkened tissue at tip of toe. Only faintly palpable PT pulses, no palpable DP pulse. Warm to the touch SKIN: Warm and dry.  NEUROLOGIC:  Alert and oriented x 3 PSYCHIATRIC:  Normal affect   ASSESSMENT:    1. Paroxysmal atrial fibrillation (HCC)   2. PAD (peripheral artery disease) ([place])   3. History of stroke within last year   4. Hyperlipidemia LDL goal <70    PLAN:    1. Paroxysmal atrial fibrillation, diagnosed at the time of CVA:  CHA2DS2/VAS Stroke Risk Points  = 7    Points Metrics  0 Has Congestive Heart Failure:  No   1 Has Vascular Disease:  Yes, PAD  1 Has Hypertension:  Yes   2 Age:  55   1 Has Diabetes:  Yes   2 Had Stroke:  Yes  Had TIA:  No  Had thromboembolism:  No   0 Male:  No     -continue apixaban for anticoagulation He is on reduced dose of 2.5 mg; however, his body weight is >60 kg and Cr is less than 1.5, so he is actually recommended for 5 mg BID dosing. Adjusted this today.  -tolerating metoprolol for rate control of afib (in SR today)  With CVA:  -on statin for secondary prevention, LDL goal <70. Last LDL 80, had simvastatin dose increased since then. Recheck at next visit or sooner if other blood work done -hypertension goal <130/80 -diabetes control   2. PAD: lesion on left great toe concerning for ischemia. Foot is warm, only faintly palpable pulse. Patient states that it is getting better with conservative treatment. Recommended that it be monitored  closely at the SNF. If it worsens or fails to improve, would do ABIs and consider referral to vascular. He denies rest pain,  claudication (though minimally ambulatory now), erythema with dependent foot positioning.  Plan for follow up: 3 mos  Medication Adjustments/Labs and Tests Ordered: Current medicines are reviewed at length with the patient today.  Concerns regarding medicines are outlined above.  Orders Placed This Encounter  Procedures  . EKG 12-Lead   Meds ordered this encounter  Medications  . apixaban (ELIQUIS) 5 MG TABS tablet    Sig: Take 1 tablet (5 mg total) by mouth 2 (two) times daily.    Dispense:  180 tablet    Refill:  3    Patient Instructions  Medication Instructions:  Your Physician recommend you continue on your current medication as directed.    If you need a refill on your cardiac medications before your next appointment, please call your pharmacy.   Lab work: None   Testing/Procedures: None  Follow-Up: At [facility], you and your health needs are our priority.  As part of our continuing mission to provide you with exceptional heart care, we have created designated Provider Care Teams.  These Care Teams include your primary Cardiologist (physician) and Advanced Practice Providers (APPs -  Physician Assistants and Nurse Practitioners) who all work together to provide you with the care you need, when you need it. You will need a follow up appointment in 3 months.  Please call our office 2 months in advance to schedule this appointment.  You may see Buford Dresser, MD or one of the following Advanced Practice Providers on your designated Care Team:   Rosaria Ferries, PA-C . Jory Sims, DNP, ANP  Any Other Special Instructions Will Be Listed Below (If Applicable).       Signed, Buford Dresser, MD PhD 05/13/2018 5:57 PM    [place]

## 2018-05-13 NOTE — Patient Instructions (Signed)
Medication Instructions:  Your Physician recommend you continue on your current medication as directed.    If you need a refill on your cardiac medications before your next appointment, please call your pharmacy.   Lab work: None   Testing/Procedures: None  Follow-Up: At [facility], you and your health needs are our priority.  As part of our continuing mission to provide you with exceptional heart care, we have created designated Provider Care Teams.  These Care Teams include your primary Cardiologist (physician) and Advanced Practice Providers (APPs -  Physician Assistants and Nurse Practitioners) who all work together to provide you with the care you need, when you need it. You will need a follow up appointment in 3 months.  Please call our office 2 months in advance to schedule this appointment.  You may see Buford Dresser, MD or one of the following Advanced Practice Providers on your designated Care Team:   Rosaria Ferries, PA-C . Jory Sims, DNP, ANP  Any Other Special Instructions Will Be Listed Below (If Applicable).

## 2018-05-14 ENCOUNTER — Telehealth: Payer: Self-pay

## 2018-05-14 DIAGNOSIS — Z8673 Personal history of transient ischemic attack (TIA), and cerebral infarction without residual deficits: Secondary | ICD-10-CM

## 2018-05-14 DIAGNOSIS — I480 Paroxysmal atrial fibrillation: Secondary | ICD-10-CM

## 2018-05-14 MED ORDER — APIXABAN 5 MG PO TABS: 5.0000 mg | ORAL_TABLET | Freq: Two times a day (BID) | ORAL | 3 refills | Status: DC

## 2018-05-14 NOTE — Telephone Encounter (Signed)
Spoke with Nurse from [facility] and informed that Dr. Harrell Gave will like to change pt's Eliquis from 2.5 to 5 mg. Will fax new order to facility fax number provided [phone].

## 2018-05-20 ENCOUNTER — Non-Acute Institutional Stay (SKILLED_NURSING_FACILITY): Payer: Medicare Other | Admitting: Family

## 2018-05-20 ENCOUNTER — Encounter: Payer: Self-pay | Admitting: Family

## 2018-05-20 DIAGNOSIS — E1122 Type 2 diabetes mellitus with diabetic chronic kidney disease: Secondary | ICD-10-CM | POA: Diagnosis not present

## 2018-05-20 DIAGNOSIS — I6300 Cerebral infarction due to thrombosis of unspecified precerebral artery: Secondary | ICD-10-CM

## 2018-05-20 DIAGNOSIS — N183 Chronic kidney disease, stage 3 unspecified: Secondary | ICD-10-CM

## 2018-05-20 DIAGNOSIS — I129 Hypertensive chronic kidney disease with stage 1 through stage 4 chronic kidney disease, or unspecified chronic kidney disease: Secondary | ICD-10-CM

## 2018-05-20 DIAGNOSIS — R471 Dysarthria and anarthria: Secondary | ICD-10-CM

## 2018-05-20 DIAGNOSIS — E785 Hyperlipidemia, unspecified: Secondary | ICD-10-CM

## 2018-05-20 DIAGNOSIS — I480 Paroxysmal atrial fibrillation: Secondary | ICD-10-CM

## 2018-05-20 DIAGNOSIS — R2681 Unsteadiness on feet: Secondary | ICD-10-CM

## 2018-05-27 DIAGNOSIS — I480 Paroxysmal atrial fibrillation: Secondary | ICD-10-CM

## 2018-05-27 DIAGNOSIS — E1151 Type 2 diabetes mellitus with diabetic peripheral angiopathy without gangrene: Secondary | ICD-10-CM

## 2018-05-27 DIAGNOSIS — N400 Enlarged prostate without lower urinary tract symptoms: Secondary | ICD-10-CM

## 2018-05-27 DIAGNOSIS — R1310 Dysphagia, unspecified: Secondary | ICD-10-CM

## 2018-05-27 DIAGNOSIS — E1122 Type 2 diabetes mellitus with diabetic chronic kidney disease: Secondary | ICD-10-CM | POA: Diagnosis not present

## 2018-05-27 DIAGNOSIS — I129 Hypertensive chronic kidney disease with stage 1 through stage 4 chronic kidney disease, or unspecified chronic kidney disease: Secondary | ICD-10-CM

## 2018-05-27 DIAGNOSIS — I69351 Hemiplegia and hemiparesis following cerebral infarction affecting right dominant side: Secondary | ICD-10-CM | POA: Diagnosis not present

## 2018-05-27 DIAGNOSIS — N183 Chronic kidney disease, stage 3 (moderate): Secondary | ICD-10-CM | POA: Diagnosis not present

## 2018-05-28 ENCOUNTER — Other Ambulatory Visit: Payer: Self-pay

## 2018-05-28 NOTE — Patient Outreach (Signed)
[facility]) Care Management  05/28/2018  Nathan Cook [birth year] [id number]   Medication Adherence call to [name] spoke with patient he is due on Simvastatin 10 mg patient has been in the hospital since August/2019 until now and  they provide it him with all his medications. [name] is showing past due under [place].    [facility] Management Direct Dial [phone]  Fax [phone] [email or web address]

## 2018-05-29 NOTE — Progress Notes (Signed)
Location:  [facility] Room Number: N37A Place of Service:  SNF [phone])  Provider: Marlowe Sax FNP-C   PCP: Wardell Honour, MD Patient Care Team: Wardell Honour, MD as PCP - General (Family Medicine) Buford Dresser, MD as PCP - Cardiology (Cardiology)  Extended Emergency Contact Information Primary Emergency Contact: [place] Address: [address]          [place], [place] [address] [name] of [place] Phone: [phone] Relation: Daughter  Code Status: DNR Goals of care:  Advanced Directive information Advanced Directives 05/20/2018  Does Patient Have a Medical Advance Directive? Yes  Type of Paramedic of [place];Living will  Does patient want to make changes to medical advance directive? No - Patient declined  Copy of [place] in Chart? Yes - validated most recent copy scanned in chart (See row information)     No Known Allergies  Chief Complaint  Patient presents with  . Discharge Note    Discharge from SNF to [facility] independent living    HPI:  84 y.o. male seen today at [facility] for discharge to [facility] independent Living apartment.He was here for short term rehabilitation for post hospital admission from 04/01/2018 -04/09/2018 for signs of stroke after he had difficulties with his speech,difficulty swallowing and had copius secretion.Patient was in his kitchen preparing a sandwich when this symptoms occurred.In the ED he had no significant weakness but had mild left ataxia thus stroke code was called and IV TPA was given.  He has has unremarkable stay here in rehab.He has worked well with PT/OT now stable for discharge.He was also seen by speech therapist his diet has been advanced to regular diet with thin liquids.He has tolerated diet and thin liquids well. He will be discharged to [facility] independent Living with Home health PT/OT to continue with ROM, Exercise, Gait stability and  muscle strengthening.He required new DME prior to discharge hard script written for Rolator to allow him to maintain current level of independence with his ADL's.   Home health services and discharged process will be arranged by facility social worker prior to discharge.He will be discharged with his Prescription medication then patient to follow up with [facility] PCP in 1-2 weeks. He denies any acute issues this visit. Facility staff report no new concerns.No recent fall episodes or weight changes. His CBG log reviewed readings in the 120's-170's with few readings in the low 200's.      Past Medical History:  Diagnosis Date  . BPH (benign prostatic hyperplasia) 10/14/2014  . Cervicalgia 01/04/2016  . Chronic kidney disease   . Contusion of left shoulder 05/27/14  . DM type 2 (diabetes mellitus, type 2) ([place])   . Dysphagia   . High blood pressure   . History of basal cell cancer 11/21/2005   Right temple  . History of colonic polyps 10/14/2003  . Hyperlipidemia   . Left knee pain 05/27/14  . Peripheral vascular disease ([place])   . SAH (subarachnoid hemorrhage) ([place]) 2009   TBI  . Stroke ([place])   . TIA (transient ischemic attack) 11/06/2005   Left eye pain. Slurred speech. Diaphoresis. No residual effects.     Past Surgical History:  Procedure Laterality Date  . Achilles tendon repair Left 1998   ruptured tendon  . COLONOSCOPY  1992   Polyp removed  . COLONOSCOPY  1998   normal  . ORIF FEMUR FRACTURE Right 1929  . TONSILLECTOMY  reports that he quit smoking about 46 years ago. He has never used smokeless tobacco. He reports that he drinks about 1.0 - 2.0 standard drinks of alcohol per week. He reports that he does not use drugs. Social History   Socioeconomic History  . Marital status: Married    Spouse name: Not on file  . Number of children: Not on file  . Years of education: Not on file  . Highest education level: Not on file  Occupational History  .  Occupation: retired Copy  . Financial resource strain: Not on file  . Food insecurity:    Worry: Not on file    Inability: Not on file  . Transportation needs:    Medical: Not on file    Non-medical: Not on file  Tobacco Use  . Smoking status: Former Smoker    Last attempt to quit: 06/26/1971    Years since quitting: 46.9  . Smokeless tobacco: Never Used  . Tobacco comment: 2 1/2 packs a day, until 1972  Substance and Sexual Activity  . Alcohol use: Yes    Alcohol/week: 1.0 - 2.0 standard drinks    Types: 1 - 2 Glasses of wine per week    Comment: Wine nightly  . Drug use: No  . Sexual activity: Not on file  Lifestyle  . Physical activity:    Days per week: Not on file    Minutes per session: Not on file  . Stress: Not on file  Relationships  . Social connections:    Talks on phone: Not on file    Gets together: Not on file    Attends religious service: Not on file    Active member of club or organization: Not on file    Attends meetings of clubs or organizations: Not on file    Relationship status: Not on file  . Intimate partner violence:    Fear of current or ex partner: Not on file    Emotionally abused: Not on file    Physically abused: Not on file    Forced sexual activity: Not on file  Other Topics Concern  . Not on file  Social History Narrative   Patient [facility] at [facility] since [facility]   Caffeine- Coffee   Married- Yes, 1951   [employer] [employer] 1943-46   House- Apartment with 2 people   Pets- No   Current/past profession- Sales   Exercise- Yes, walking   Living will- Yes   DNR- Yes   POA/HPOA- Yes      No Known Allergies  Pertinent  Health Maintenance Due  Topic Date Due  . OPHTHALMOLOGY EXAM  07/07/2017  . FOOT EXAM  06/27/2018  . HEMOGLOBIN A1C  10/02/2018  . INFLUENZA VACCINE  Completed  . PNA vac Low Risk Adult  Completed    Medications: Outpatient Encounter Medications as of 05/20/2018  Medication Sig  .  acetaminophen (TYLENOL 8 HOUR) 650 MG CR tablet Take 1 tablet (650 mg total) by mouth every 8 (eight) hours as needed for pain.  Marland Kitchen apixaban (ELIQUIS) 5 MG TABS tablet Take 1 tablet (5 mg total) by mouth 2 (two) times daily.  . Calcium Carb-Cholecalciferol (CALCIUM 600+D3 PO) Take 1 tablet by mouth daily.  . Cholecalciferol 1000 UNITS tablet Take 1,000 Units by mouth daily.  Marland Kitchen losartan (COZAAR) 25 MG tablet Take 25 mg by mouth 2 (two) times daily.   . metFORMIN (GLUCOPHAGE) 500 MG tablet Take 500 mg by  mouth 2 (two) times daily with a meal.  . metoprolol tartrate (LOPRESSOR) 25 MG tablet Take 1 tablet (25 mg total) by mouth 2 (two) times daily.  Marland Kitchen Propylene Glycol-Glycerin 1-0.3 % SOLN Place 2 drops into both eyes as needed.  . simvastatin (ZOCOR) 10 MG tablet Take 10 mg by mouth daily.  Marland Kitchen zinc oxide 20 % ointment Apply 1 application topically as needed for irritation. To buttocks after every incontinent episode and as needed for redness. May keep at bedside.   No facility-administered encounter medications on file as of 05/20/2018.      Review of Systems  Constitutional: Negative for appetite change, chills, fatigue and fever.  HENT: Positive for trouble swallowing. Negative for congestion, rhinorrhea, sinus pressure, sinus pain, sneezing and sore throat.        Dysarthria has improved.   Eyes: Positive for visual disturbance. Negative for discharge, redness and itching.       Wears eye glasses   Respiratory: Negative for cough, chest tightness, shortness of breath and wheezing.   Cardiovascular: Negative for chest pain, palpitations and leg swelling.  Gastrointestinal: Negative for abdominal distention, constipation, diarrhea, nausea and vomiting.  Endocrine: Negative for cold intolerance, heat intolerance, polydipsia, polyphagia and polyuria.  Genitourinary: Negative for dysuria, flank pain, frequency, hematuria and urgency.  Musculoskeletal: Positive for gait problem.  Skin: Negative  for color change, pallor and rash.  Neurological: Positive for speech difficulty. Negative for dizziness, weakness, light-headedness and headaches.  Hematological: Does not bruise/bleed easily.  Psychiatric/Behavioral: Negative for agitation, confusion and sleep disturbance. The patient is not nervous/anxious.     Vitals:   05/20/18 1020  BP: 120/60  Pulse: 63  Resp: 20  Temp: (!) 97.1 F (36.2 C)  TempSrc: Oral  SpO2: 96%  Weight: 143 lb 1.6 oz (64.9 kg)  Height: 6\' 1"  (1.854 m)   Body mass index is 18.88 kg/m. Physical Exam  Constitutional: He is oriented to person, place, and time and well-developed, well-nourished, and in no distress. No distress.  HENT:  Head: Normocephalic.  Right Ear: External ear normal.  Left Ear: External ear normal.  Mouth/Throat: Oropharynx is clear and moist. No oropharyngeal exudate.  Eyes: Pupils are equal, round, and reactive to light. Conjunctivae and EOM are normal. Right eye exhibits no discharge. Left eye exhibits no discharge. No scleral icterus.  Corrective lens in place   Neck: Normal range of motion. No JVD present. No thyromegaly present.  Cardiovascular: Intact distal pulses. Exam reveals no gallop and no friction rub.  Murmur heard. Pulmonary/Chest: Effort normal and breath sounds normal. No respiratory distress. He has no wheezes. He has no rales.  Abdominal: Soft. Bowel sounds are normal. He exhibits no distension and no mass. There is no tenderness. There is no rebound and no guarding.  Musculoskeletal: He exhibits no edema or tenderness.  Unsteady gait ambulates with Rolator.right side slight weakness.  Lymphadenopathy:    He has no cervical adenopathy.  Neurological: He is oriented to person, place, and time.   dysarthria has improved   Skin: Skin is warm and dry. No rash noted. No erythema. No pallor.  Skin intact   Psychiatric: Mood, affect and judgment normal.     Labs reviewed: Basic Metabolic Panel: Recent Labs     04/03/18 0304 04/04/18 0426 04/06/18 0348  NA 136 138 141  K 3.7 3.8 3.7  CL 105 105 110  CO2 22 18* 24  GLUCOSE 148* 156* 141*  BUN 28* 28* 24*  CREATININE 1.36*  1.30* 1.10  CALCIUM 8.6* 8.4* 8.4*   Liver Function Tests: Recent Labs    09/10/17 0750 03/13/18 0000 04/01/18 1432  AST 18 24 25   ALT 21 26 19   ALKPHOS  --   --  57  BILITOT 0.7 0.9 0.9  PROT 6.5 6.6 6.6  ALBUMIN  --   --  3.7   CBC: Recent Labs    04/01/18 1432  04/03/18 0304 04/04/18 0426 04/06/18 0348  WBC 8.9   < > 24.0* 15.2* 12.4*  NEUTROABS 6.3  --   --   --   --   HGB 12.7*   < > 12.0* 13.3 13.5  HCT 38.4*   < > 36.2* 40.0 40.5  MCV 104.9*   < > 100.3* 101.8* 102.0*  PLT 317   < > 365 338 336   < > = values in this interval not displayed.    Recent Labs    04/09/18 0804 04/09/18 1113 04/09/18 1629  GLUCAP 165* 103* 141*    Procedures and Imaging Studies During Stay: No results found.  Assessment/Plan:    1. Unsteady gait  Has worked well with PT/ OT. Will discharge to [facility] with home health  PT/OT to continue with ROM, Exercise, Gait stability and muscle strengthening.Rolator ordered on recent visit. Continue on Fall and safety precautions.   2. Benign hypertension with chronic kidney disease, stage III (HCC) B/p stable.continue on Metoprolol 25 mg tablet twice daily,losartan 25 mg tablet twice daily.CBC,BMP in 1-2 weeks.   3. Controlled type 2 diabetes mellitus with stage 3 chronic kidney disease, without long-term current use of insulin (HCC) Recent Hgb A1C 7.6 (04/02/2018). CBG reviewed ranging in the 120's-170's.continue on metformin 500 mg tablet twice daily with meals. Continue on Statin and apixaban.  4. Dysarthria Has improved with speech therapy. Currently on regular diet with thin liquids.tolerating well.   5. Hyperlipidemia LDL goal <70 LDL 80 ( 04/02/2018).continue on simvastatin 10 mg tablet daily.   6. Paroxysmal atrial fibrillation (HCC) Continue on apixaban 5 mg  tablet twice daily and simvastatin 10 mg tablet daily.  7. Cerebrovascular accident (CVA) due to thrombosis of precerebral artery ([place]) Status post hospital admission 04/01/2018 -04/09/2018 for signs of stroke: difficulties with his speech,difficulty swallowing.tPA administered in the ED.slight right side weakness and dysarthria has improved.on regular diet and thin liquids.continue on control high risk factors.    Patient is being discharged with the following home health services:   -PT/OT for ROM, exercise, gait stability and muscle strengthening   Patient is being discharged with the following durable medical equipment:   Rolator ordered in previous visit.   Patient has been advised to f/u with their PCP in 1-2 weeks to for a transitions of care visit.Social services at their facility was responsible for arranging this appointment.  Pt was provided with adequate prescriptions of noncontrolled medications to reach the scheduled appointment.For controlled substances, a limited supply was provided as appropriate for the individual patient. If the pt normally receives these medications from a pain clinic or has a contract with another physician, these medications should be received from that clinic or physician only).    Future labs/tests needed:  CBC, BMP in 1-2 weeks PCP  Follow up : 1-2 weeks post SNF discharge.

## 2018-06-05 ENCOUNTER — Non-Acute Institutional Stay: Payer: Medicare Other | Admitting: Family Medicine

## 2018-06-05 ENCOUNTER — Encounter: Payer: Self-pay | Admitting: Family Medicine

## 2018-06-05 VITALS — BP 136/58 | HR 72 | Temp 97.6°F | Resp 14 | Ht 73.0 in | Wt 145.4 lb

## 2018-06-05 DIAGNOSIS — E785 Hyperlipidemia, unspecified: Secondary | ICD-10-CM

## 2018-06-05 DIAGNOSIS — E1122 Type 2 diabetes mellitus with diabetic chronic kidney disease: Secondary | ICD-10-CM

## 2018-06-05 DIAGNOSIS — N183 Chronic kidney disease, stage 3 unspecified: Secondary | ICD-10-CM

## 2018-06-05 DIAGNOSIS — I129 Hypertensive chronic kidney disease with stage 1 through stage 4 chronic kidney disease, or unspecified chronic kidney disease: Secondary | ICD-10-CM | POA: Diagnosis not present

## 2018-06-05 DIAGNOSIS — S91332D Puncture wound without foreign body, left foot, subsequent encounter: Secondary | ICD-10-CM

## 2018-06-05 DIAGNOSIS — I6389 Other cerebral infarction: Secondary | ICD-10-CM

## 2018-06-05 DIAGNOSIS — S91339A Puncture wound without foreign body, unspecified foot, initial encounter: Secondary | ICD-10-CM | POA: Insufficient documentation

## 2018-06-05 MED ORDER — NITROGLYCERIN 0.2 MG/HR TD PT24: 0.2000 mg | MEDICATED_PATCH | Freq: Every day | TRANSDERMAL | 0 refills | Status: DC

## 2018-06-05 NOTE — Progress Notes (Signed)
Provider:  Alain Honey, MD Location:  [place] of Service:  Clinic (12)  PCP: Nathan Honour, MD Patient Care Team: Nathan Honour, MD as PCP - General (Family Medicine) Nathan Dresser, MD as PCP - Cardiology (Cardiology)  Extended Emergency Contact Information Primary Emergency Contact: Nathan Cook,Nathan Cook Address: [address]          [place], [place] [address] [place] Phone: [phone] Relation: Daughter  Code Status: Full code  Goals of Care: Advanced Directive information Advanced Directives 06/05/2018  Does Patient Have a Medical Advance Directive? Yes  Type of Paramedic of [place];Living will  Does patient want to make changes to medical advance directive? No - Patient declined  Copy of [place] in Chart? Yes - validated most recent copy scanned in chart (See row information)      Chief Complaint  Patient presents with  . Medical Management of [facility] follow up. had eye exam 3 months ago     HPI: Patient is a 84 y.o. male seen today for follow-up after discharge from skilled nursing patient after suffering a stroke.  While in skilled nursing he worked with speech therapy PT and OT progressed very well.  At the time of discharge she had no residual from stroke.  He felt like he made most progress in the area of speech therapy as he was having some dysphasia immediately post stroke.  He is continuing to receive some home health services is generally doing very well and has apartment.  He is the main caregiver for his wife who has some cognitive issues. Chronic problems include hypertension, diabetes, and hyperlipidemia as well as peripheral arterial disease.  He does have a nonhealing wound on the tip of his left great toe.  He has seen podiatry as well as vascular surgery for this.  He is not interested in arteriograms or revascularization.  Is currently  taking an antibiotic for this wound.  Past Medical History:  Diagnosis Date  . BPH (benign prostatic hyperplasia) 10/14/2014  . Cervicalgia 01/04/2016  . Chronic kidney disease   . Contusion of left shoulder 05/27/14  . DM type 2 (diabetes mellitus, type 2) ([place])   . Dysphagia   . High blood pressure   . History of basal cell cancer 11/21/2005   Right temple  . History of colonic polyps 10/14/2003  . Hyperlipidemia   . Left knee pain 05/27/14  . Peripheral vascular disease ([place])   . SAH (subarachnoid hemorrhage) ([place]) 2009   TBI  . Stroke ([place])   . TIA (transient ischemic attack) 11/06/2005   Left eye pain. Slurred speech. Diaphoresis. No residual effects.    Past Surgical History:  Procedure Laterality Date  . Achilles tendon repair Left 1998   ruptured tendon  . COLONOSCOPY  1992   Polyp removed  . COLONOSCOPY  1998   normal  . ORIF FEMUR FRACTURE Right 1929  . TONSILLECTOMY      reports that he quit smoking about 46 years ago. He has never used smokeless tobacco. He reports that he drinks about 1.0 - 2.0 standard drinks of alcohol per week. He reports that he does not use drugs. Social History   Socioeconomic History  . Marital status: Married    Spouse name: Not on file  . Number of children: Not on file  . Years of education: Not on file  . Highest education level: Not on  file  Occupational History  . Occupation: retired Copy  . Financial resource strain: Not on file  . Food insecurity:    Worry: Not on file    Inability: Not on file  . Transportation needs:    Medical: Not on file    Non-medical: Not on file  Tobacco Use  . Smoking status: Former Smoker    Last attempt to quit: 06/26/1971    Years since quitting: 46.9  . Smokeless tobacco: Never Used  . Tobacco comment: 2 1/2 packs a day, until 1972  Substance and Sexual Activity  . Alcohol use: Yes    Alcohol/week: 1.0 - 2.0 standard drinks    Types: 1 - 2 Glasses of wine per week     Comment: Wine nightly  . Drug use: No  . Sexual activity: Not on file  Lifestyle  . Physical activity:    Days per week: Not on file    Minutes per session: Not on file  . Stress: Not on file  Relationships  . Social connections:    Talks on phone: Not on file    Gets together: Not on file    Attends religious service: Not on file    Active member of club or organization: Not on file    Attends meetings of clubs or organizations: Not on file    Relationship status: Not on file  . Intimate partner violence:    Fear of current or ex partner: Not on file    Emotionally abused: Not on file    Physically abused: Not on file    Forced sexual activity: Not on file  Other Topics Concern  . Not on file  Social History Narrative   Patient lives at [facility] since Dec 2015   Caffeine- Coffee   Married- Yes, 1951   [employer] 1943-46   House- Apartment with 2 people   Pets- No   Current/past profession- Press photographer   Exercise- Yes, walking   Living will- Yes   DNR- Yes   POA/HPOA- Yes       Functional Status Survey:    Family History  Problem Relation Age of Onset  . Heart attack Mother   . Heart disease Mother   . Heart disease Father     Health Maintenance  Topic Date Due  . OPHTHALMOLOGY EXAM  07/07/2017  . FOOT EXAM  06/27/2018  . HEMOGLOBIN A1C  10/02/2018  . TETANUS/TDAP  04/29/2027  . INFLUENZA VACCINE  Completed  . PNA vac Low Risk Adult  Completed    No Known Allergies  Outpatient Encounter Medications as of 06/05/2018  Medication Sig  . acetaminophen (TYLENOL 8 HOUR) 650 MG CR tablet Take 1 tablet (650 mg total) by mouth every 8 (eight) hours as needed for pain.  Marland Kitchen apixaban (ELIQUIS) 5 MG TABS tablet Take 1 tablet (5 mg total) by mouth 2 (two) times daily.  . Calcium Carb-Cholecalciferol (CALCIUM 600+D3 PO) Take 1 tablet by mouth daily.  . Cholecalciferol 1000 UNITS tablet Take 1,000 Units by mouth daily.  . clindamycin (CLEOCIN) 300 MG capsule Take 1  capsule by mouth 3 (three) times daily.   Marland Kitchen losartan (COZAAR) 25 MG tablet Take 25 mg by mouth 2 (two) times daily.   . metFORMIN (GLUCOPHAGE) 500 MG tablet Take 500 mg by mouth 2 (two) times daily with a meal.  . metoprolol tartrate (LOPRESSOR) 25 MG tablet Take 1 tablet (25 mg total) by mouth 2 (two)  times daily.  Marland Kitchen Propylene Glycol-Glycerin 1-0.3 % SOLN Place 2 drops into both eyes as needed.  . simvastatin (ZOCOR) 10 MG tablet Take 10 mg by mouth daily.  Marland Kitchen SSD 1 % cream Apply 1 drop topically 2 (two) times daily.  Marland Kitchen zinc oxide 20 % ointment Apply 1 application topically as needed for irritation. To buttocks after every incontinent episode and as needed for redness. May keep at bedside.   No facility-administered encounter medications on file as of 06/05/2018.     Review of Systems  Constitutional: Negative.   HENT: Negative.   Respiratory: Negative.   Cardiovascular: Negative.   Gastrointestinal: Negative.   Musculoskeletal: Positive for gait problem.  Neurological: Positive for dizziness and numbness.  Psychiatric/Behavioral: Negative.     Vitals:   06/05/18 0944  BP: (!) 136/58  Pulse: 72  Resp: 14  Temp: 97.6 F (36.4 C)  TempSrc: Oral  SpO2: 95%  Weight: 145 lb 6.4 oz (66 kg)  Height: '6\' 1"'$  (1.854 m)   Body mass index is 19.18 kg/m. Physical Exam  Constitutional: He is oriented to person, place, and time. He appears well-developed and well-nourished.  HENT:  Head: Normocephalic.  Mouth/Throat: Oropharynx is clear and moist.  Eyes: Pupils are equal, round, and reactive to light.  Cardiovascular: Normal rate and regular rhythm.  Murmur heard. Pulmonary/Chest: Effort normal and breath sounds normal.  Abdominal: Soft. Bowel sounds are normal.  Musculoskeletal: Normal range of motion.  Uses rolling walker for help with ambulation.  Neurological: He is alert and oriented to person, place, and time.  Skin:  Circular wound on tip of left great toe, non-healing    Nursing note and vitals reviewed.   Labs reviewed: Basic Metabolic Panel: Recent Labs    04/03/18 0304 04/04/18 0426 04/06/18 0348  NA 136 138 141  K 3.7 3.8 3.7  CL 105 105 110  CO2 22 18* 24  GLUCOSE 148* 156* 141*  BUN 28* 28* 24*  CREATININE 1.36* 1.30* 1.10  CALCIUM 8.6* 8.4* 8.4*   Liver Function Tests: Recent Labs    09/10/17 0750 03/13/18 0000 04/01/18 1432  AST '18 24 25  '$ ALT '21 26 19  '$ ALKPHOS  --   --  57  BILITOT 0.7 0.9 0.9  PROT 6.5 6.6 6.6  ALBUMIN  --   --  3.7   No results for input(s): LIPASE, AMYLASE in the last 8760 hours. No results for input(s): AMMONIA in the last 8760 hours. CBC: Recent Labs    04/01/18 1432  04/03/18 0304 04/04/18 0426 04/06/18 0348  WBC 8.9   < > 24.0* 15.2* 12.4*  NEUTROABS 6.3  --   --   --   --   HGB 12.7*   < > 12.0* 13.3 13.5  HCT 38.4*   < > 36.2* 40.0 40.5  MCV 104.9*   < > 100.3* 101.8* 102.0*  PLT 317   < > 365 338 336   < > = values in this interval not displayed.   Cardiac Enzymes: No results for input(s): CKTOTAL, CKMB, CKMBINDEX, TROPONINI in the last 8760 hours. BNP: Invalid input(s): POCBNP Lab Results  Component Value Date   HGBA1C 7.6 (H) 04/02/2018   No results found for: TSH Lab Results  Component Value Date   VITAMINB12 1,226 (H) 09/10/2017   No results found for: FOLATE No results found for: IRON, TIBC, FERRITIN  Imaging and Procedures obtained prior to SNF admission: Ct Angio Head W Or Wo Contrast  Result  Date: 04/01/2018 CLINICAL DATA:  Stroke EXAM: CT ANGIOGRAPHY HEAD AND NECK TECHNIQUE: Multidetector CT imaging of the head and neck was performed using the standard protocol during bolus administration of intravenous contrast. Multiplanar CT image reconstructions and MIPs were obtained to evaluate the vascular anatomy. Carotid stenosis measurements (when applicable) are obtained utilizing NASCET criteria, using the distal internal carotid diameter as the denominator. CONTRAST:  100m  ISOVUE-370 IOPAMIDOL (ISOVUE-370) INJECTION 76% COMPARISON:  CT head 04/01/2018 FINDINGS: CTA NECK FINDINGS Aortic arch: Atherosclerotic calcification aortic arch and proximal great vessels. Proximal great vessels widely patent. Right carotid system: Atherosclerotic calcification right carotid bulb without significant stenosis. Left carotid system: Atherosclerotic calcification left carotid bulb. 25% diameter stenosis internal carotid artery on the left. Remainder of the left internal carotid artery widely patent. Vertebral arteries: Right vertebral artery dominant. Moderate stenosis origin of right vertebral artery. Remainder of the right vertebral artery is widely patent to the basilar with mild atherosclerotic disease distally. Moderate stenosis at the origin of the left vertebral artery. Left vertebral artery diffusely diseased and occludes at the C1 level and does not continue to the basilar. Left PICA not visualized. Skeleton: Cervical spondylosis without acute skeletal abnormality. Other neck: Negative Upper chest: Mild bronchiectasis and scarring in the right upper lobe. 5 mm right upper lobe nodule axial image 158 Review of the MIP images confirms the above findings CTA HEAD FINDINGS Anterior circulation: Atherosclerotic calcification in the cavernous carotid bilaterally causing mild stenosis bilaterally. Anterior and middle cerebral arteries patent bilaterally without stenosis or large vessel occlusion Posterior circulation: Right vertebral artery patent to the basilar with mild calcific stenosis V4 segment. Right PICA is widely patent. Occlusion of left vertebral artery at the C1 level without reconstitution. Left PICA not visualized. Basilar widely patent. Mild atherosclerotic disease in the basilar artery. Superior cerebellar and posterior cerebral arteries patent bilaterally without stenosis. Venous sinuses: Patent Anatomic variants: None Delayed phase: Not performed Review of the MIP images confirms  the above findings IMPRESSION: Mild atherosclerotic disease in the carotid bifurcation bilaterally. Atherosclerotic calcification and mild stenosis in the cavernous carotid bilaterally. Moderate stenosis at the origin of the vertebral artery bilaterally. Occlusion distal left vertebral artery at the C1 level. Left PICA occluded. This could be acute or chronic. Correlate with neurologic examination and MRI. These results were called by telephone at the time of interpretation on 04/01/2018 at 3:18 pm to Dr. JRosalin Hawking, who verbally acknowledged these results. Electronically Signed   By: CFranchot GalloM.D.   On: 04/01/2018 15:18   Dg Shoulder Right  Result Date: 04/02/2018 CLINICAL DATA:  Right shoulder pain for several years EXAM: RIGHT SHOULDER - 2+ VIEW COMPARISON:  None. FINDINGS: Degenerative changes of the acromioclavicular joint are seen. No acute fracture or dislocation is noted. The humeral head is high-riding consistent with chronic right-sided cuff injury. The underlying bony thorax is within normal limits. No soft tissue changes are seen. IMPRESSION: Chronic changes without acute abnormality. Electronically Signed   By: MInez CatalinaM.D.   On: 04/02/2018 14:17   Ct Head Wo Contrast  Result Date: 04/02/2018 CLINICAL DATA:  Stroke follow-up, 24 hours post tPA EXAM: CT HEAD WITHOUT CONTRAST TECHNIQUE: Contiguous axial images were obtained from the base of the skull through the vertex without intravenous contrast. COMPARISON:  Yesterday FINDINGS: Brain: Small volume subarachnoid hemorrhage within the left temporoparietal sulcus, in retrospect stable from prior. No visible infarct, mass, hydrocephalus, or collection. Vascular: Atherosclerotic calcification Skull: No acute or aggressive finding Sinuses/Orbits: Chronic  left maxillary sinusitis. Critical Value/emergent results were called by telephone at the time of interpretation on 04/02/2018 at 1:57 pm to Dr. Rosalin Hawking , who verbally acknowledged these  results. IMPRESSION: 1. Small volume subarachnoid hemorrhage along the left temporoparietal convexity, in retrospect stable from yesterday. 2. No visible acute infarct. Electronically Signed   By: Monte Fantasia M.D.   On: 04/02/2018 14:03   Ct Angio Neck W And/or Wo Contrast  Result Date: 04/01/2018 CLINICAL DATA:  Stroke EXAM: CT ANGIOGRAPHY HEAD AND NECK TECHNIQUE: Multidetector CT imaging of the head and neck was performed using the standard protocol during bolus administration of intravenous contrast. Multiplanar CT image reconstructions and MIPs were obtained to evaluate the vascular anatomy. Carotid stenosis measurements (when applicable) are obtained utilizing NASCET criteria, using the distal internal carotid diameter as the denominator. CONTRAST:  72m ISOVUE-370 IOPAMIDOL (ISOVUE-370) INJECTION 76% COMPARISON:  CT head 04/01/2018 FINDINGS: CTA NECK FINDINGS Aortic arch: Atherosclerotic calcification aortic arch and proximal great vessels. Proximal great vessels widely patent. Right carotid system: Atherosclerotic calcification right carotid bulb without significant stenosis. Left carotid system: Atherosclerotic calcification left carotid bulb. 25% diameter stenosis internal carotid artery on the left. Remainder of the left internal carotid artery widely patent. Vertebral arteries: Right vertebral artery dominant. Moderate stenosis origin of right vertebral artery. Remainder of the right vertebral artery is widely patent to the basilar with mild atherosclerotic disease distally. Moderate stenosis at the origin of the left vertebral artery. Left vertebral artery diffusely diseased and occludes at the C1 level and does not continue to the basilar. Left PICA not visualized. Skeleton: Cervical spondylosis without acute skeletal abnormality. Other neck: Negative Upper chest: Mild bronchiectasis and scarring in the right upper lobe. 5 mm right upper lobe nodule axial image 158 Review of the MIP images  confirms the above findings CTA HEAD FINDINGS Anterior circulation: Atherosclerotic calcification in the cavernous carotid bilaterally causing mild stenosis bilaterally. Anterior and middle cerebral arteries patent bilaterally without stenosis or large vessel occlusion Posterior circulation: Right vertebral artery patent to the basilar with mild calcific stenosis V4 segment. Right PICA is widely patent. Occlusion of left vertebral artery at the C1 level without reconstitution. Left PICA not visualized. Basilar widely patent. Mild atherosclerotic disease in the basilar artery. Superior cerebellar and posterior cerebral arteries patent bilaterally without stenosis. Venous sinuses: Patent Anatomic variants: None Delayed phase: Not performed Review of the MIP images confirms the above findings IMPRESSION: Mild atherosclerotic disease in the carotid bifurcation bilaterally. Atherosclerotic calcification and mild stenosis in the cavernous carotid bilaterally. Moderate stenosis at the origin of the vertebral artery bilaterally. Occlusion distal left vertebral artery at the C1 level. Left PICA occluded. This could be acute or chronic. Correlate with neurologic examination and MRI. These results were called by telephone at the time of interpretation on 04/01/2018 at 3:18 pm to Dr. JRosalin Hawking, who verbally acknowledged these results. Electronically Signed   By: CFranchot GalloM.D.   On: 04/01/2018 15:18   Mr Brain Wo Contrast  Result Date: 04/02/2018 CLINICAL DATA:  Stroke. EXAM: MRI HEAD WITHOUT CONTRAST TECHNIQUE: Multiplanar, multiecho pulse sequences of the brain and surrounding structures were obtained without intravenous contrast. COMPARISON:  CT head 04/01/2018. CT head 04/02/2018. CTA head 04/01/2018. FINDINGS: Brain: Acute LEFT inferior medullary infarct, restricted diffusion, corresponding low ADC, no blood products, LEFT PICA territory, correlates with LEFT vertebral and PICA occlusion on CTA. Elsewhere no  acute stroke, mass lesion, or hydrocephalus. Atrophy with chronic microvascular ischemic change. Acute subarachnoid blood  in the LEFT posterior temporal region correlates with the CT abnormalities, and appears increased from the original CT, when technique differences are considered. No draining vein. There may be a tiny focus of parenchymal hemorrhage of an acute nature, see series 12, image 24. Etiology unknown, query occult trauma, with shearing injury. Vascular: Flow voids are maintained in the carotid, basilar, and RIGHT vertebral arteries. LEFT vertebral demonstrates lack of flow void, consistent with acute occlusion. Skull and upper cervical spine: Normal marrow signal. Sinuses/Orbits: Chronic sinus disease. Hypoplastic LEFT maxillary sinus. Negative orbits. Other: Normal mastoids. IMPRESSION: Acute LEFT medullary brainstem infarct correlating with recent LEFT vertebral and PICA occlusion. LEFT posterior temporal subarachnoid hemorrhage, uncertain etiology, appears to be increased volume from original CT, perhaps tPA related. Atrophy with chronic microvascular ischemic change. Electronically Signed   By: Staci Righter M.D.   On: 04/02/2018 14:34   Dg Swallowing Func-speech Pathology  Result Date: 04/02/2018 Objective Swallowing Evaluation: Type of Study: MBS-Modified Barium Swallow Study  Patient Details Name: Nathan Cook MRN: [id number] Date of Birth: 26-Nov-1921 Today's Date: 04/02/2018 Time: SLP Start Time (ACUTE ONLY): 5427 -SLP Stop Time (ACUTE ONLY): 1330 SLP Time Calculation (min) (ACUTE ONLY): 35 min Past Medical History: Past Medical History: Diagnosis Date . BPH (benign prostatic hyperplasia) 10/14/2014 . Cervicalgia 01/04/2016 . Contusion of left shoulder 05/27/14 . DM type 2 (diabetes mellitus, type 2) ([place])  . High blood pressure  . History of basal cell cancer 11/21/2005  Right temple . History of colonic polyps 10/14/2003 . Hyperlipidemia  . Left knee pain 05/27/14 . SAH (subarachnoid  hemorrhage) ([place]) 2009  TBI . TIA (transient ischemic attack) 11/06/2005  Left eye pain. Slurred speech. Diaphoresis. No residual effects.  Past Surgical History: Past Surgical History: Procedure Laterality Date . Achilles tendon repair Left 1998  ruptured tendon . COLONOSCOPY  1992  Polyp removed . COLONOSCOPY  1998  normal . ORIF FEMUR FRACTURE Right 1929 . TONSILLECTOMY   HPI: 84 y.o. male with PMH of HTN, HLD, DM, PVD presented with acute onset difficulty speech and swallowing, left-sided ataxia.  CT head showed stable atrophy and white matter disease, likely within normal limits for age. MRI pending.  Subjective: alert, communicative Assessment / Plan / Recommendation CHL IP CLINICAL IMPRESSIONS 04/02/2018 Clinical Impression Pt presents with a severe pharyngeal dysphagia marked by impaired mobility of the larynx and pharynx, leading to incomplete laryngeal vestibular closure (LVC) and poor pharyngeal squeeze.  There is sensed aspiration of liquids before and during the swallow due to incomplete LVC and after the swallow due to spillage of residue from the pyriforms. There is severe vallecular and pyriform residue that pt is unable to clear despite postural adjustments.  UES only partially opens due to compromised elevation of the larynx - this contributes to the degree of pyriform residue.  Pt self-suctioned and worked to expectorate residue after the study.  For now, recommend continuing NPO; consider temporary enteral feeding via cortrak (spoke with Dr. Erlinda Hong); SLP to address therapeutic exercise.  Allow occasional ice chips after oral care.  SLP Visit Diagnosis Dysphagia, pharyngeal phase (R13.13) Attention and concentration deficit following -- Frontal lobe and executive function deficit following -- Impact on safety and function Severe aspiration risk   CHL IP TREATMENT RECOMMENDATION 04/02/2018 Treatment Recommendations Therapy as outlined in treatment plan below   Prognosis 04/02/2018 Prognosis for Safe Diet  Advancement Fair Barriers to Reach Goals -- Barriers/Prognosis Comment -- CHL IP DIET RECOMMENDATION 04/02/2018 SLP Diet Recommendations NPO;Alternative means - temporary Liquid  Administration via -- Medication Administration -- Compensations -- Postural Changes --   CHL IP OTHER RECOMMENDATIONS 04/02/2018 Recommended Consults -- Oral Care Recommendations Oral care QID Other Recommendations --   CHL IP FOLLOW UP RECOMMENDATIONS 04/02/2018 Follow up Recommendations Inpatient Rehab   CHL IP FREQUENCY AND DURATION 04/02/2018 Speech Therapy Frequency (ACUTE ONLY) min 3x week Treatment Duration 1 week      CHL IP ORAL PHASE 04/02/2018 Oral Phase Impaired Oral - Pudding Teaspoon -- Oral - Pudding Cup -- Oral - Cook Teaspoon -- Oral - Cook Cup -- Oral - Nectar Teaspoon -- Oral - Nectar Cup -- Oral - Nectar Straw Piecemeal swallowing Oral - Thin Teaspoon -- Oral - Thin Cup -- Oral - Thin Straw Piecemeal swallowing Oral - Puree Piecemeal swallowing Oral - Mech Soft -- Oral - Regular -- Oral - Multi-Consistency -- Oral - Pill -- Oral Phase - Comment --  CHL IP PHARYNGEAL PHASE 04/02/2018 Pharyngeal Phase Impaired Pharyngeal- Pudding Teaspoon -- Pharyngeal -- Pharyngeal- Pudding Cup -- Pharyngeal -- Pharyngeal- Cook Teaspoon -- Pharyngeal -- Pharyngeal- Cook Cup -- Pharyngeal -- Pharyngeal- Nectar Teaspoon Delayed swallow initiation-vallecula;Reduced pharyngeal peristalsis;Reduced epiglottic inversion;Reduced laryngeal elevation;Reduced airway/laryngeal closure;Penetration/Aspiration during swallow;Penetration/Apiration after swallow;Pharyngeal residue - pyriform;Pharyngeal residue - valleculae;Penetration/Aspiration before swallow Pharyngeal Material enters airway, remains ABOVE vocal cords and not ejected out Pharyngeal- Nectar Cup -- Pharyngeal -- Pharyngeal- Nectar Straw Delayed swallow initiation-vallecula;Reduced laryngeal elevation;Reduced airway/laryngeal closure;Penetration/Aspiration before  swallow;Penetration/Aspiration during swallow;Penetration/Apiration after swallow;Pharyngeal residue - valleculae;Pharyngeal residue - pyriform Pharyngeal Material enters airway, remains ABOVE vocal cords and not ejected out Pharyngeal- Thin Teaspoon Delayed swallow initiation-pyriform sinuses;Reduced pharyngeal peristalsis;Reduced epiglottic inversion;Reduced laryngeal elevation;Reduced airway/laryngeal closure;Penetration/Aspiration before swallow;Trace aspiration;Pharyngeal residue - valleculae;Pharyngeal residue - pyriform Pharyngeal Material enters airway, passes BELOW cords and not ejected out despite cough attempt by patient Pharyngeal- Thin Cup -- Pharyngeal -- Pharyngeal- Thin Straw Delayed swallow initiation-pyriform sinuses;Reduced pharyngeal peristalsis;Reduced epiglottic inversion;Reduced laryngeal elevation;Reduced airway/laryngeal closure;Penetration/Aspiration before swallow;Penetration/Aspiration during swallow;Penetration/Apiration after swallow;Trace aspiration;Pharyngeal residue - valleculae;Pharyngeal residue - pyriform;Compensatory strategies attempted (with notebox) Pharyngeal Material enters airway, passes BELOW cords and not ejected out despite cough attempt by patient Pharyngeal- Puree Delayed swallow initiation-vallecula;Reduced pharyngeal peristalsis;Reduced epiglottic inversion;Reduced laryngeal elevation;Reduced airway/laryngeal closure;Pharyngeal residue - valleculae;Pharyngeal residue - pyriform Pharyngeal -- Pharyngeal- Mechanical Soft -- Pharyngeal -- Pharyngeal- Regular -- Pharyngeal -- Pharyngeal- Multi-consistency -- Pharyngeal -- Pharyngeal- Pill -- Pharyngeal -- Pharyngeal Comment --  CHL IP CERVICAL ESOPHAGEAL PHASE 04/02/2018 Cervical Esophageal Phase (No Data) Pudding Teaspoon -- Pudding Cup -- Cook Teaspoon -- Cook Cup -- Nectar Teaspoon -- Nectar Cup -- Nectar Straw -- Thin Teaspoon -- Thin Cup -- Thin Straw -- Puree -- Mechanical Soft -- Regular -- Multi-consistency --  Pill -- Cervical Esophageal Comment -- Nathan Cook 04/02/2018, 1:50 PM              Ct Head Code Stroke Wo Contrast  Result Date: 04/01/2018 CLINICAL DATA:  Code stroke. Code stroke. Difficulty swallowing. Left-sided weakness. Last seen normal 1 hour ago. EXAM: CT HEAD WITHOUT CONTRAST TECHNIQUE: Contiguous axial images were obtained from the base of the skull through the vertex without intravenous contrast. COMPARISON:  CT head without contrast 10/04/2017 FINDINGS: Brain: Atrophy and white matter changes are within normal limits for age. Basal ganglia are intact. No acute infarct, hemorrhage, or mass lesion is present. Insular cortex is within normal limits. No acute or focal cortical abnormality is present ventricles are proportionate to the degree of atrophy. No significant extra-axial fluid collection is present. The brainstem and cerebellum are  normal. Vascular: Atherosclerotic calcifications are present the cavernous internal carotid arteries bilaterally and at the dural margin of both vertebral arteries. There is no hyperdense vessel. Skull: Calvarium is intact. No focal lytic or blastic lesions are present. Sinuses/Orbits: Chronic left maxillary sinus disease present. The paranasal sinuses and mastoid air cells are otherwise clear. Globes and orbits are within normal limits bilaterally. ASPECTS [facility] Stroke Program Early CT Score) - Ganglionic level infarction (caudate, lentiform nuclei, internal capsule, insula, M1-M3 cortex): 7/7 - Supraganglionic infarction (M4-M6 cortex): 3/3 Total score (0-10 with 10 being normal): 10/10 IMPRESSION: 1. Stable atrophy and white matter disease, likely within normal limits for age. 2. No acute intracranial abnormality. 3. Atherosclerosis. 4. ASPECTS is 10/10 The above was relayed via text pager to Dr. Erlinda Hong on 04/01/2018 at 14:49 . Electronically Signed   By: San Morelle M.D.   On: 04/01/2018 14:51    Assessment/Plan 1. Benign hypertension with  chronic kidney disease, stage III (HCC) Blood pressures are well controlled on current regimen of losartan twice daily metoprolol will continue same - CMP with eGFR([facility]); Future  2. 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  We will try to maintain control of blood pressure lipids and blood sugar to prevent further events    3. Hyperlipidemia LDL goal <70 Tolerates fairly low-dose of simvastatin.  Most recent LDL was 80 done 2 months ago - Lipid panel; Future  4. Controlled type 2 diabetes mellitus with stage 3 chronic kidney disease, without long-term current use of insulin ([place]) Patient currently takes metformin 500 mg twice daily.  Blood sugars are rarely above 150.  Most recent A1c is 7.6.  I believe controlled in the elderly should not be as tight as younger patients and as long as the sugar stays under 150 think that is acceptable. - Hemoglobin A1c; Future  5. Penetrating wound of left foot, subsequent encounter Wound initially began when podiatrist accidentally penetrated the tip of his left great toe while trimming the nail.  It has not healed.  He has diabetes as well as arterial disease which inhibit healing.  I would like to try a nitroglycerin patch, one fourth of a 8.2 mg patch.  Will use this for 1 month to see if we can improve circulation and thus healing   Family/ staff Communication: Findings communicated to patient  Labs/tests ordered:  Lillette Boxer. Sabra Heck, [place] [address] [place], [place] Office [phone]

## 2018-06-06 ENCOUNTER — Telehealth: Payer: Self-pay | Admitting: *Deleted

## 2018-06-06 ENCOUNTER — Telehealth: Payer: Self-pay

## 2018-06-06 NOTE — Telephone Encounter (Signed)
  Estill Bamberg with [employer] called and stated that she had some questions regarding the Rx written yesterday for Nitroglycerin patch:  1. There are two separate directions on Rx Escribed. Confirm One Patch applied or 1/4 patch.  2. It is not recommended that the patch be cut, if the directions are apply 1/4 patch.   Please Advise.      Per Dr. Sabra Heck OV Note 06/05/18: 5. Penetrating wound of left foot, subsequent encounter Wound initially began when podiatrist accidentally penetrated the tip of his left great toe while trimming the nail.  It has not healed.  He has diabetes as well as arterial disease which inhibit healing.  I would like to try a nitroglycerin patch, one fourth of a 8.2 mg patch.  Will use this for 1 month to see if we can improve circulation and thus healing

## 2018-06-06 NOTE — Telephone Encounter (Signed)
Dr. Sabra Heck called pharmacy and explained that he wanted patient to apply 1/4 patch. I called patient to confirm that the Doctor spoke with pharmacy and that he was good to pick the RX up.Patient verbalized understanding

## 2018-06-06 NOTE — Telephone Encounter (Signed)
Patient called stating that he was unable to pick up his prescription for nitrogycerin 0.2 mg / hr patch at [facility]. He stated he would like to be contacted when his prescription is fixed. I let the patient know someone will contact him as soon as the prescription order is clarified.

## 2018-06-10 ENCOUNTER — Telehealth: Payer: Self-pay

## 2018-06-10 ENCOUNTER — Ambulatory Visit: Payer: Medicare Other | Admitting: Family Medicine

## 2018-06-10 ENCOUNTER — Other Ambulatory Visit: Payer: Self-pay | Admitting: *Deleted

## 2018-06-10 ENCOUNTER — Other Ambulatory Visit: Payer: Self-pay

## 2018-06-10 ENCOUNTER — Encounter: Payer: Self-pay | Admitting: Family Medicine

## 2018-06-10 DIAGNOSIS — I872 Venous insufficiency (chronic) (peripheral): Secondary | ICD-10-CM | POA: Insufficient documentation

## 2018-06-10 MED ORDER — METOPROLOL TARTRATE 25 MG PO TABS: 25.0000 mg | ORAL_TABLET | Freq: Two times a day (BID) | ORAL | 1 refills | Status: DC

## 2018-06-10 MED ORDER — TRIAMCINOLONE ACETONIDE 0.1 % EX LOTN: 1.0000 "application " | TOPICAL_LOTION | Freq: Three times a day (TID) | CUTANEOUS | 0 refills | Status: DC

## 2018-06-10 NOTE — Telephone Encounter (Signed)
Nathan Cook called requesting to be seen in [facility] today, I spoke with Dr. Sabra Heck about scheduling an appointment. He stated he would be willing to see patient today or tomorrow in clinic. Tried to call patient back to set up an appointment and left a message on his answering machine to call clinic concerning his need to be seen.

## 2018-06-10 NOTE — Progress Notes (Signed)
Location:  [place] of Service:  Clinic  Provider: Dr. Alain Honey  Code Status: FULL Goals of Care:  Advanced Directives 06/10/2018  Does Patient Have a Medical Advance Directive? Yes  Type of Advance Directive Living will;Healthcare Power of Attorney  Does patient want to make changes to medical advance directive? No - Patient declined  Copy of University in Chart? Yes - validated most recent copy scanned in chart (See row information)     Chief Complaint  Patient presents with  . Acute Visit    [place] patient came into clinic today with redness and swelling in both feet     HPI: Patient is a 84 y.o. male seen today for an acute visit for redness and discomfort in both feet.  Patient was seen in clinic care last week for among other things a nonhealing small ulcer on the tip of his left great toe.  I ordered nitroglycerin patch to in improve circulation and hopefully healing to that toe but now the foot is injected and red as is the opposite foot.  He tells me that he is use the patch twice and it has not stayed on well either time so I doubt that what we are seeing is related to the patch but I cannot rule it out totally.  He does have some dependent swelling and he says the swelling will get worse as the day progresses.  There is a history of peripheral vascular disease and he has appointment tomorrow with the vascular surgeon.  Past Medical History:  Diagnosis Date  . BPH (benign prostatic hyperplasia) 10/14/2014  . Cervicalgia 01/04/2016  . Chronic kidney disease   . Contusion of left shoulder 05/27/14  . DM type 2 (diabetes mellitus, type 2) ([place])   . Dysphagia   . High blood pressure   . History of basal cell cancer 11/21/2005   Right temple  . History of colonic polyps 10/14/2003  . Hyperlipidemia   . Left knee pain 05/27/14  . Peripheral vascular disease ([place])   . SAH (subarachnoid hemorrhage) ([place]) 2009   TBI  . Stroke ([place])   .  TIA (transient ischemic attack) 11/06/2005   Left eye pain. Slurred speech. Diaphoresis. No residual effects.     Past Surgical History:  Procedure Laterality Date  . Achilles tendon repair Left 1998   ruptured tendon  . COLONOSCOPY  1992   Polyp removed  . COLONOSCOPY  1998   normal  . ORIF FEMUR FRACTURE Right 1929  . TONSILLECTOMY      No Known Allergies  Outpatient Encounter Medications as of 06/10/2018  Medication Sig  . acetaminophen (TYLENOL 8 HOUR) 650 MG CR tablet Take 1 tablet (650 mg total) by mouth every 8 (eight) hours as needed for pain.  Marland Kitchen apixaban (ELIQUIS) 5 MG TABS tablet Take 1 tablet (5 mg total) by mouth 2 (two) times daily.  . Calcium Carb-Cholecalciferol (CALCIUM 600+D3 PO) Take 1 tablet by mouth daily.  . Cholecalciferol 1000 UNITS tablet Take 1,000 Units by mouth daily.  . clindamycin (CLEOCIN) 300 MG capsule Take 1 capsule by mouth 3 (three) times daily.   Marland Kitchen losartan (COZAAR) 25 MG tablet Take 25 mg by mouth 2 (two) times daily.   . metFORMIN (GLUCOPHAGE) 500 MG tablet Take 500 mg by mouth 2 (two) times daily with a meal.  . metoprolol tartrate (LOPRESSOR) 25 MG tablet Take 1 tablet (25 mg total) by mouth 2 (two) times  daily.  . nitroGLYCERIN (NITRODUR - DOSED IN MG/24 HR) 0.2 mg/hr patch Place 1 patch (0.2 mg total) onto the skin daily.  Marland Kitchen Propylene Glycol-Glycerin 1-0.3 % SOLN Place 2 drops into both eyes as needed.  . simvastatin (ZOCOR) 10 MG tablet Take 10 mg by mouth daily.  Marland Kitchen SSD 1 % cream Apply 1 drop topically 2 (two) times daily.  Marland Kitchen zinc oxide 20 % ointment Apply 1 application topically as needed for irritation. To buttocks after every incontinent episode and as needed for redness. May keep at bedside.   No facility-administered encounter medications on file as of 06/10/2018.     Review of Systems:  Review of Systems  Constitutional: Negative.   HENT: Negative.   Respiratory: Negative.   Cardiovascular: Negative.   Skin: Positive for color  change.       Bilateral feet  Neurological: Negative.     Health Maintenance  Topic Date Due  . OPHTHALMOLOGY EXAM  07/07/2017  . FOOT EXAM  06/27/2018  . HEMOGLOBIN A1C  10/02/2018  . TETANUS/TDAP  04/29/2027  . INFLUENZA VACCINE  Completed  . PNA vac Low Risk Adult  Completed    Physical Exam: Vitals:   06/10/18 1418  BP: 122/60  Pulse: 87  Temp: (!) 97.5 F (36.4 C)  TempSrc: Oral  SpO2: 99%  Weight: 149 lb (67.6 kg)  Height: 6\' 1"  (1.854 m)   Body mass index is 19.66 kg/m. Physical Exam Vitals signs and nursing note reviewed. Exam conducted with a chaperone present.  Constitutional:      Appearance: Normal appearance.  HENT:     Head: Normocephalic.  Cardiovascular:     Rate and Rhythm: Normal rate.     Heart sounds: Normal heart sounds.  Pulmonary:     Effort: Pulmonary effort is normal.     Breath sounds: Normal breath sounds.  Skin:    Comments: Both feet are red to the ankles but there is no increased temp or fever in either foot. He is taking oral antibiotic that was prescribed to him by podiatrist.  That is clindamycin.  He is also using Silvadene on his left great toe.  Neurological:     Mental Status: He is alert.     Labs reviewed: Basic Metabolic Panel: Recent Labs    04/03/18 0304 04/04/18 0426 04/06/18 0348  NA 136 138 141  K 3.7 3.8 3.7  CL 105 105 110  CO2 22 18* 24  GLUCOSE 148* 156* 141*  BUN 28* 28* 24*  CREATININE 1.36* 1.30* 1.10  CALCIUM 8.6* 8.4* 8.4*   Liver Function Tests: Recent Labs    09/10/17 0750 03/13/18 0000 04/01/18 1432  AST 18 24 25   ALT 21 26 19   ALKPHOS  --   --  57  BILITOT 0.7 0.9 0.9  PROT 6.5 6.6 6.6  ALBUMIN  --   --  3.7   No results for input(s): LIPASE, AMYLASE in the last 8760 hours. No results for input(s): AMMONIA in the last 8760 hours. CBC: Recent Labs    04/01/18 1432  04/03/18 0304 04/04/18 0426 04/06/18 0348  WBC 8.9   < > 24.0* 15.2* 12.4*  NEUTROABS 6.3  --   --   --   --     HGB 12.7*   < > 12.0* 13.3 13.5  HCT 38.4*   < > 36.2* 40.0 40.5  MCV 104.9*   < > 100.3* 101.8* 102.0*  PLT 317   < > 365 338  336   < > = values in this interval not displayed.   Lipid Panel: Recent Labs    09/10/17 0750 03/13/18 0000 04/02/18 0259  CHOL 153 154 143  HDL 51 57 57  LDLCALC 83 78 80  TRIG 91 104 29  CHOLHDL 3.0 2.7 2.5   Lab Results  Component Value Date   HGBA1C 7.6 (H) 04/02/2018    Procedures since last visit: No results found.  Assessment/Plan 1. Stasis dermatitis of both legs Patient was seen as sort of an urgent visit today due to redness in his legs.  There is no sign of infection or gout.  I think symptoms are most consistent with a stasis dermatitis.  Although he does have some edema it is not significant at this time in the day.  I would like to have him discontinue the nitroglycerin patch as well as Silvadene cream and I will give him some triamcinolone lotion to be applied twice daily for the dermatitis.   Labs/tests ordered:  @ORDERS @ Next appt:  10/07/2018 Lillette Boxer. Sabra Heck, [place] [address] [place], [place] Office [phone]

## 2018-06-10 NOTE — Telephone Encounter (Signed)
Patient requested refill. Faxed.  

## 2018-06-11 ENCOUNTER — Other Ambulatory Visit: Payer: Self-pay | Admitting: *Deleted

## 2018-06-11 ENCOUNTER — Other Ambulatory Visit: Payer: Self-pay

## 2018-06-11 ENCOUNTER — Encounter: Payer: Self-pay | Admitting: Vascular Surgery

## 2018-06-11 ENCOUNTER — Ambulatory Visit (INDEPENDENT_AMBULATORY_CARE_PROVIDER_SITE_OTHER): Payer: Medicare Other | Admitting: Vascular Surgery

## 2018-06-11 ENCOUNTER — Encounter: Payer: Self-pay | Admitting: *Deleted

## 2018-06-11 VITALS — BP 131/67 | HR 89 | Temp 97.5°F | Resp 20 | Ht 73.0 in | Wt 149.0 lb

## 2018-06-11 DIAGNOSIS — I739 Peripheral vascular disease, unspecified: Secondary | ICD-10-CM | POA: Diagnosis not present

## 2018-06-11 NOTE — Progress Notes (Signed)
Patient name: RAKIN LEMELLE MRN: [id number] DOB: 1922/02/12 Sex: male  REASON FOR CONSULT: Evaluate for PAD  HPI: RUHAAN NORDAHL is a 84 y.o. male, with multiple medical problems including diabetes, hyperlipidemia, and recent diagnosis of A. fib, and recent stroke who presents for one month followup for left great toe.  Was initially referred for claudication.  Unfortunately when I saw him last month he had a recent stroke and was admitted to the hospital with residual right-sided weakness that required TPA.  He was in a wheelchair.   Now he appears to have progression of left great toe wound.  Has made progress with therapy and walking with walker.  Past Medical History:  Diagnosis Date  . BPH (benign prostatic hyperplasia) 10/14/2014  . Cervicalgia 01/04/2016  . Chronic kidney disease   . Contusion of left shoulder 05/27/14  . DM type 2 (diabetes mellitus, type 2) ([place])   . Dysphagia   . High blood pressure   . History of basal cell cancer 11/21/2005   Right temple  . History of colonic polyps 10/14/2003  . Hyperlipidemia   . Left knee pain 05/27/14  . Peripheral vascular disease ([place])   . SAH (subarachnoid hemorrhage) ([place]) 2009   TBI  . Stroke ([place])   . TIA (transient ischemic attack) 11/06/2005   Left eye pain. Slurred speech. Diaphoresis. No residual effects.     Past Surgical History:  Procedure Laterality Date  . Achilles tendon repair Left 1998   ruptured tendon  . COLONOSCOPY  1992   Polyp removed  . COLONOSCOPY  1998   normal  . ORIF FEMUR FRACTURE Right 1929  . TONSILLECTOMY      Family History  Problem Relation Age of Onset  . Heart attack Mother   . Heart disease Mother   . Heart disease Father     SOCIAL HISTORY: Social History   Socioeconomic History  . Marital status: Married    Spouse name: Not on file  . Number of children: Not on file  . Years of education: Not on file  . Highest education level: Not on file  Occupational History  .  Occupation: retired Copy  . Financial resource strain: Not on file  . Food insecurity:    Worry: Not on file    Inability: Not on file  . Transportation needs:    Medical: Not on file    Non-medical: Not on file  Tobacco Use  . Smoking status: Former Smoker    Last attempt to quit: 06/26/1971    Years since quitting: 46.9  . Smokeless tobacco: Never Used  . Tobacco comment: 2 1/2 packs a day, until 1972  Substance and Sexual Activity  . Alcohol use: Yes    Alcohol/week: 1.0 - 2.0 standard drinks    Types: 1 - 2 Glasses of wine per week    Comment: Wine nightly  . Drug use: No  . Sexual activity: Not on file  Lifestyle  . Physical activity:    Days per week: Not on file    Minutes per session: Not on file  . Stress: Not on file  Relationships  . Social connections:    Talks on phone: Not on file    Gets together: Not on file    Attends religious service: Not on file    Active member of club or organization: Not on file    Attends meetings of clubs or organizations: Not on file  Relationship status: Not on file  . Intimate partner violence:    Fear of current or ex partner: Not on file    Emotionally abused: Not on file    Physically abused: Not on file    Forced sexual activity: Not on file  Other Topics Concern  . Not on file  Social History Narrative   Patient lives at [facility] since Dec 2015   Caffeine- Coffee   Married- Yes, 1951   [employer] 1943-46   House- Apartment with 2 people   Pets- No   Current/past profession- Press photographer   Exercise- Yes, walking   Living will- Yes   DNR- Yes   POA/HPOA- Yes       No Known Allergies  Current Outpatient Medications  Medication Sig Dispense Refill  . acetaminophen (TYLENOL 8 HOUR) 650 MG CR tablet Take 1 tablet (650 mg total) by mouth every 8 (eight) hours as needed for pain. 60 tablet 0  . apixaban (ELIQUIS) 5 MG TABS tablet Take 1 tablet (5 mg total) by mouth 2 (two) times daily. 180  tablet 3  . Calcium Carb-Cholecalciferol (CALCIUM 600+D3 PO) Take 1 tablet by mouth daily.    . Cholecalciferol 1000 UNITS tablet Take 1,000 Units by mouth daily.    . clindamycin (CLEOCIN) 300 MG capsule Take 1 capsule by mouth 3 (three) times daily.     Marland Kitchen losartan (COZAAR) 25 MG tablet Take 25 mg by mouth 2 (two) times daily.     . metFORMIN (GLUCOPHAGE) 500 MG tablet Take 500 mg by mouth 2 (two) times daily with a meal.    . metoprolol tartrate (LOPRESSOR) 25 MG tablet Take 1 tablet (25 mg total) by mouth 2 (two) times daily. 180 tablet 1  . nitroGLYCERIN (NITRODUR - DOSED IN MG/24 HR) 0.2 mg/hr patch Place 1 patch (0.2 mg total) onto the skin daily. 10 patch 0  . Propylene Glycol-Glycerin 1-0.3 % SOLN Place 2 drops into both eyes as needed.    . simvastatin (ZOCOR) 10 MG tablet Take 10 mg by mouth daily.    Marland Kitchen SSD 1 % cream Apply 1 drop topically 2 (two) times daily.    Marland Kitchen triamcinolone lotion (KENALOG) 0.1 % Apply 1 application topically 3 (three) times daily. 60 mL 0  . zinc oxide 20 % ointment Apply 1 application topically as needed for irritation. To buttocks after every incontinent episode and as needed for redness. May keep at bedside.     No current facility-administered medications for this visit.     REVIEW OF SYSTEMS:  [X]  denotes positive finding, [ ]  denotes negative finding Cardiac  Comments:  Chest pain or chest pressure:    Shortness of breath upon exertion:    Short of breath when lying flat:    Irregular heart rhythm:        Vascular    Pain in calf, thigh, or hip brought on by ambulation: x   Pain in feet at night that wakes you up from your sleep:     Blood clot in your veins:    Leg swelling:         Pulmonary    Oxygen at home:    Productive cough:     Wheezing:         Neurologic    Sudden weakness in arms or legs:     Sudden numbness in arms or legs:     Sudden onset of difficulty speaking or slurred speech:  Temporary loss of vision in one eye:       Problems with dizziness:         Gastrointestinal    Blood in stool:     Vomited blood:         Genitourinary    Burning when urinating:     Blood in urine:        Psychiatric    Major depression:         Hematologic    Bleeding problems:    Problems with blood clotting too easily:        Skin    Rashes or ulcers:        Constitutional    Fever or chills:      PHYSICAL EXAM: Vitals:   06/11/18 1348  BP: 131/67  Pulse: 89  Resp: 20  Temp: (!) 97.5 F (36.4 C)  SpO2: 96%  Weight: 149 lb (67.6 kg)  Height: 6\' 1"  (1.854 m)    GENERAL: The patient is a well-nourished male, in no acute distress. The vital signs are documented above. CARDIAC: There is a regular rate and rhythm.  VASCULAR:  2+ left radial pulse, no right radial palpable 2+ femoral palpable bilateral groins No palpable pedal pulses Left great toe ulcer now about 1.5 cm in diameter PULMONARY: There is good air exchange bilaterally without wheezing or rales. ABDOMEN: Soft and non-tender with normal pitched bowel sounds.  MUSCULOSKELETAL: There are no major deformities or cyanosis. NEUROLOGIC: Right sided weakness 3/5.   DATA:   I independently reviewed his noninvasive imaging that shows no tibial runoff in the right lower extremity with likely collateral flow and left SFA occlusion and single-vessel runoff in the left leg via peroneal.  Assessment/Plan:  On evaluation today for one month follow-up Mr. Mehring has had significant progression of his left great toe wound.  When I saw him one month ago there was just a small linear cut here and he was still in a wheelchair after a stroke that had him hospitalized and looked very poor overall.  Today he has made progress with therapy and is using a walker.  I have recommended aortogram with lower extremity arteriogram tomorrow of the left leg and possible retrograde tibial access with attempted limb salvage.  Discussed with him and his wife and concerned if  we do nothing he is at high risk for limb loss.  Risks and benefits were discussed with the patient including bleeding, infection, need for additional procedure, groin hematoma, groin pseudoaneurysm, etc.  I have asked that he hold his Eliquis tonight and tomorrow morning.  Marty Heck, MD [facility] Office: [phone] Pager: [place]

## 2018-06-12 ENCOUNTER — Other Ambulatory Visit: Payer: Self-pay

## 2018-06-12 ENCOUNTER — Encounter (HOSPITAL_COMMUNITY)
Admission: RE | Disposition: A | Discharge status: Home / Self Care | Payer: Self-pay | Source: Home / Self Care | Attending: Vascular Surgery

## 2018-06-12 ENCOUNTER — Ambulatory Visit (HOSPITAL_COMMUNITY)
Admission: RE | Admit: 2018-06-12 | Discharge: 2018-06-12 | Disposition: A | Discharge status: Home / Self Care | Payer: Medicare Other | Attending: Vascular Surgery | Admitting: Vascular Surgery

## 2018-06-12 ENCOUNTER — Telehealth: Payer: Self-pay | Admitting: Vascular Surgery

## 2018-06-12 DIAGNOSIS — I69351 Hemiplegia and hemiparesis following cerebral infarction affecting right dominant side: Secondary | ICD-10-CM | POA: Diagnosis not present

## 2018-06-12 DIAGNOSIS — E1122 Type 2 diabetes mellitus with diabetic chronic kidney disease: Secondary | ICD-10-CM | POA: Insufficient documentation

## 2018-06-12 DIAGNOSIS — I70245 Atherosclerosis of native arteries of left leg with ulceration of other part of foot: Secondary | ICD-10-CM | POA: Insufficient documentation

## 2018-06-12 DIAGNOSIS — I4891 Unspecified atrial fibrillation: Secondary | ICD-10-CM | POA: Insufficient documentation

## 2018-06-12 DIAGNOSIS — E11621 Type 2 diabetes mellitus with foot ulcer: Secondary | ICD-10-CM | POA: Diagnosis not present

## 2018-06-12 DIAGNOSIS — Z7901 Long term (current) use of anticoagulants: Secondary | ICD-10-CM | POA: Diagnosis not present

## 2018-06-12 DIAGNOSIS — N400 Enlarged prostate without lower urinary tract symptoms: Secondary | ICD-10-CM | POA: Diagnosis not present

## 2018-06-12 DIAGNOSIS — Z66 Do not resuscitate: Secondary | ICD-10-CM | POA: Diagnosis not present

## 2018-06-12 DIAGNOSIS — Z79899 Other long term (current) drug therapy: Secondary | ICD-10-CM | POA: Diagnosis not present

## 2018-06-12 DIAGNOSIS — Z87891 Personal history of nicotine dependence: Secondary | ICD-10-CM | POA: Diagnosis not present

## 2018-06-12 DIAGNOSIS — Z7984 Long term (current) use of oral hypoglycemic drugs: Secondary | ICD-10-CM | POA: Diagnosis not present

## 2018-06-12 DIAGNOSIS — N189 Chronic kidney disease, unspecified: Secondary | ICD-10-CM | POA: Insufficient documentation

## 2018-06-12 DIAGNOSIS — E785 Hyperlipidemia, unspecified: Secondary | ICD-10-CM | POA: Insufficient documentation

## 2018-06-12 DIAGNOSIS — L97521 Non-pressure chronic ulcer of other part of left foot limited to breakdown of skin: Secondary | ICD-10-CM | POA: Insufficient documentation

## 2018-06-12 DIAGNOSIS — Z8249 Family history of ischemic heart disease and other diseases of the circulatory system: Secondary | ICD-10-CM | POA: Insufficient documentation

## 2018-06-12 DIAGNOSIS — E1151 Type 2 diabetes mellitus with diabetic peripheral angiopathy without gangrene: Secondary | ICD-10-CM | POA: Insufficient documentation

## 2018-06-12 DIAGNOSIS — I70262 Atherosclerosis of native arteries of extremities with gangrene, left leg: Secondary | ICD-10-CM

## 2018-06-12 HISTORY — PX: ABDOMINAL AORTOGRAM W/LOWER EXTREMITY: CATH118223

## 2018-06-12 HISTORY — PX: PERIPHERAL VASCULAR INTERVENTION: CATH118257

## 2018-06-12 LAB — POCT I-STAT, CHEM 8
BUN: 15 mg/dL (ref 8–23)
Calcium, Ion: 1.21 mmol/L (ref 1.15–1.40)
Chloride: 102 mmol/L (ref 98–111)
Creatinine, Ser: 1.3 mg/dL — ABNORMAL HIGH (ref 0.61–1.24)
Glucose, Bld: 142 mg/dL — ABNORMAL HIGH (ref 70–99)
HEMATOCRIT: 35 % — AB (ref 39.0–52.0)
Hemoglobin: 11.9 g/dL — ABNORMAL LOW (ref 13.0–17.0)
Potassium: 4.1 mmol/L (ref 3.5–5.1)
Sodium: 137 mmol/L (ref 135–145)
TCO2: 26 mmol/L (ref 22–32)

## 2018-06-12 LAB — GLUCOSE, CAPILLARY: Glucose-Capillary: 129 mg/dL — ABNORMAL HIGH (ref 70–99)

## 2018-06-12 LAB — POCT ACTIVATED CLOTTING TIME: Activated Clotting Time: 230 seconds

## 2018-06-12 SURGERY — ABDOMINAL AORTOGRAM W/LOWER EXTREMITY
Anesthesia: LOCAL

## 2018-06-12 MED ORDER — FENTANYL CITRATE (PF) 100 MCG/2ML IJ SOLN
INTRAMUSCULAR | Status: AC
  Filled 2018-06-12: qty 2

## 2018-06-12 MED ORDER — SODIUM CHLORIDE 0.9 % IV SOLN: INTRAVENOUS | Status: DC

## 2018-06-12 MED ORDER — LIDOCAINE HCL (PF) 1 % IJ SOLN
INTRAMUSCULAR | Status: DC | PRN
  Administered 2018-06-12: 20 mL via INTRADERMAL
  Administered 2018-06-12: 2 mL via INTRADERMAL

## 2018-06-12 MED ORDER — HEPARIN (PORCINE) IN NACL 1000-0.9 UT/500ML-% IV SOLN
INTRAVENOUS | Status: AC
  Filled 2018-06-12: qty 500

## 2018-06-12 MED ORDER — ACETAMINOPHEN 325 MG PO TABS: 650.0000 mg | ORAL_TABLET | ORAL | Status: DC | PRN

## 2018-06-12 MED ORDER — MIDAZOLAM HCL 2 MG/2ML IJ SOLN
INTRAMUSCULAR | Status: DC | PRN
  Administered 2018-06-12: 1 mg via INTRAVENOUS

## 2018-06-12 MED ORDER — LIDOCAINE HCL (PF) 1 % IJ SOLN
INTRAMUSCULAR | Status: AC
  Filled 2018-06-12: qty 30

## 2018-06-12 MED ORDER — HEPARIN SODIUM (PORCINE) 1000 UNIT/ML IJ SOLN
INTRAMUSCULAR | Status: AC
  Filled 2018-06-12: qty 1

## 2018-06-12 MED ORDER — FENTANYL CITRATE (PF) 100 MCG/2ML IJ SOLN
INTRAMUSCULAR | Status: DC | PRN
  Administered 2018-06-12 (×2): 25 ug via INTRAVENOUS

## 2018-06-12 MED ORDER — HYDRALAZINE HCL 20 MG/ML IJ SOLN: 5.0000 mg | INTRAMUSCULAR | Status: DC | PRN

## 2018-06-12 MED ORDER — LABETALOL HCL 5 MG/ML IV SOLN: 10.0000 mg | INTRAVENOUS | Status: DC | PRN

## 2018-06-12 MED ORDER — SODIUM CHLORIDE 0.9 % IV SOLN
INTRAVENOUS | Status: DC
  Administered 2018-06-12: 09:00:00 via INTRAVENOUS

## 2018-06-12 MED ORDER — MIDAZOLAM HCL 2 MG/2ML IJ SOLN
INTRAMUSCULAR | Status: AC
  Filled 2018-06-12: qty 2

## 2018-06-12 MED ORDER — ONDANSETRON HCL 4 MG/2ML IJ SOLN: 4.0000 mg | Freq: Four times a day (QID) | INTRAMUSCULAR | Status: DC | PRN

## 2018-06-12 MED ORDER — SODIUM CHLORIDE 0.9 % IV SOLN: 250.0000 mL | INTRAVENOUS | Status: DC | PRN

## 2018-06-12 MED ORDER — SODIUM CHLORIDE 0.9% FLUSH: 3.0000 mL | INTRAVENOUS | Status: DC | PRN

## 2018-06-12 MED ORDER — HEPARIN (PORCINE) IN NACL 1000-0.9 UT/500ML-% IV SOLN
INTRAVENOUS | Status: DC | PRN
  Administered 2018-06-12 (×2): 500 mL

## 2018-06-12 MED ORDER — SODIUM CHLORIDE 0.9% FLUSH: 3.0000 mL | Freq: Two times a day (BID) | INTRAVENOUS | Status: DC

## 2018-06-12 MED ORDER — IOHEXOL 350 MG/ML SOLN
INTRAVENOUS | Status: DC | PRN
  Administered 2018-06-12: 170 mL via INTRA_ARTERIAL

## 2018-06-12 MED ORDER — HEPARIN SODIUM (PORCINE) 1000 UNIT/ML IJ SOLN
INTRAMUSCULAR | Status: DC | PRN
  Administered 2018-06-12: 7000 [IU] via INTRAVENOUS
  Administered 2018-06-12: 2000 [IU] via INTRAVENOUS

## 2018-06-12 SURGICAL SUPPLY — 25 items
CATH CXI 2.6F 65 ST (CATHETERS) ×1
CATH OMNI FLUSH 5F 65CM (CATHETERS) ×3 IMPLANT
CATH QUICKCROSS .018X135CM (MICROCATHETER) ×3 IMPLANT
CATH QUICKCROSS .035X135CM (MICROCATHETER) ×3 IMPLANT
CATH SPRT STRG 65X2.6FR ACPT (CATHETERS) ×2 IMPLANT
CLOSURE MYNX CONTROL 6F/7F (Vascular Products) ×3 IMPLANT
DEVICE TORQUE .014-.018 (MISCELLANEOUS) ×2 IMPLANT
DEVICE TORQUE .025-.038 (MISCELLANEOUS) ×3 IMPLANT
GLIDEWIRE ADV .035X260CM (WIRE) ×3 IMPLANT
KIT MICROPUNCTURE NIT STIFF (SHEATH) ×3 IMPLANT
KIT PV (KITS) ×3 IMPLANT
PATCH THROMBIX TOPICAL PLAIN (HEMOSTASIS) ×3 IMPLANT
SHEATH FLEX ANSEL ANG 6F 45CM (SHEATH) ×3 IMPLANT
SHEATH MICROPUNCTURE PEDAL 4FR (SHEATH) ×3 IMPLANT
SHEATH PINNACLE 5F 10CM (SHEATH) ×3 IMPLANT
SHEATH PINNACLE 6F 10CM (SHEATH) ×3 IMPLANT
SHEATH PROBE COVER 6X72 (BAG) ×3 IMPLANT
SYR MEDRAD MARK 7 150ML (SYRINGE) ×3 IMPLANT
TORQUE DEVICE .014-.018 (MISCELLANEOUS) ×3
TRANSDUCER W/STOPCOCK (MISCELLANEOUS) ×3 IMPLANT
TRAY PV CATH (CUSTOM PROCEDURE TRAY) ×3 IMPLANT
WIRE BENTSON .035X145CM (WIRE) ×3 IMPLANT
WIRE G V18X300CM (WIRE) ×9 IMPLANT
WIRE SPARTACORE .014X300CM (WIRE) ×3 IMPLANT
WIRE TORQFLEX AUST .018X40CM (WIRE) ×9 IMPLANT

## 2018-06-12 NOTE — Op Note (Signed)
Patient name: Nathan Cook MRN: [id number] DOB: 07-05-1921 Sex: male  06/12/2018 Pre-operative Diagnosis: Critical limb ischemia of the bilateral lower extremities with tissue loss in the left great toe Post-operative diagnosis:  Same Surgeon:  Marty Heck, MD Assistant: Servando Snare, MD Procedure Performed: 1.  Ultrasound-guided access of the right common femoral artery 2.  Aortogram with bilateral lower extremity arteriogram 3.  Attempted retrograde cannulation of the left peroneal artery at the ankle using fluoroscopic guidance 4.  Mynx closure of the right common femoral artery 5.  70 minutes of monitored moderate conscious sedation time  Indications: Patient is a 84 year old male with multiple medical comorbidities who was initially evaluated in clinic as a referral for claudication.  During the initial evaluation he had just had a significant stroke and was in a wheelchair and we elected to follow him with 1 month follow-up.  When he presented yesterday for his one-month follow-up he had an ulcer on his left great toe that had progressed and noninvasive imaging suggested single-vessel peroneal runoff.  He presents today for aortogram bilateral lower extremity extremity arteriogram after risk and benefits were discussed.  Findings:  Aortogram revealed no significant aortoiliac disease that needed intervention. Left lower extremity arteriogram revealed a patent common femoral and profunda as well as a patent proximal to mid SFA that was moderately diseased and calcified.  Patient had a short focal occlusion of his distal SFA at Hunter's canal with reconstitution of his above-knee popliteal artery and a patent below-knee popliteal artery.  He had a short focal occlusion approximately 5 cm in length of the proximal peroneal with occlusion of the tibioperoneal trunk and reconstitution of the peroneal and single-vessel runoff via the peroneal that was heavily diseased throughout  its course. Right lower extremity arterial revealed a patent common femoral profunda as well as a patent SFA.  Patient had a patent above and below-knee popliteal artery.  Patient had occlusion of all 3 tibial vessels in the right lower extremity with reconstitution of a very diseased and small peroneal just above the ankle for a very short focal segment.   Procedure:  The patient was identified in the holding area and taken to room 8.  The patient was then placed supine on the table and prepped and draped in the usual sterile fashion.  A time out was called.  Ultrasound was used to evaluate the right common femoral artery.  It was patent .  A digital ultrasound image was acquired.  A micropuncture needle was used to access the right common femoral artery under ultrasound guidance.  An 018 wire was advanced without resistance and a micropuncture sheath was placed.  The 018 wire was removed and a benson wire was placed.  The micropuncture sheath was exchanged for a 5 french sheath.  An omniflush catheter was advanced over the wire to the level of L-1.  An abdominal angiogram was obtained.  Next the catheter was pulled down to the aortic bifurcation and bilateral lower extremity runoff was obtained.  Pertinent findings are noted above.  Our plan was to intervene on the left lower extremity since that is the extremity with a wound.  I knew ahead of time that this was likely going to be retrograde peroneal access given that his duplex showed an occluded SFA and tibial trifurcation with single vessel peroneal runoff.  At that point in time we used a Glidewire advantage to cross the aortic bifurcation with an Omni Flush catheter and selected the left  SFA over which we exchanged for a long 6 [place] Ansell in the right groin.  The patient was given 8000 units of IV heparin and ACT is rechecked to maintain them greater than 250.  Ultimately once our sheath was in the SFA over the aorta bifurcation used a Glidewire  advantage and a quick cross catheter to cross the short focal SFA occlusion into the below-knee popliteal artery.  Then used multiple injections through the quick cross catheter to identify the peroneal that reconstituted in the left calf and then used fluoroscopic guidance to attempt retrograde cannulation of the peroneal at the ankle since we could not cross this antegrade with a V 18 wire and 0.018 quick cross.  We used a lot of local anesthesia and 3 different locations above the ankle had successfully entered the peroneal artery with pulsatile backbleeding through the microneedle but ultimately could not get a wire to advance into the peroneal for enough purchase to get our sheath in place.  After using 170 mL of contrast and still not having any success with our wire we elected to abort the procedure.  Ultimately the micro-sheath was removed from his left calf.  We exchanged for a short 6 French sheath in the right groin and a mynx closure device was deployed.  He will be taken back in stable condition.  Plan: Patient will need palliative wound care to his left great toe.  We were unable to successfully cross his peroneal artery occlusion antegrade and/or retrograde and I do not have any other revascularization to offer him at this time.    Marty Heck, MD [facility] Office: [phone] Pager: [place]

## 2018-06-12 NOTE — Discharge Instructions (Signed)
Femoral Site Care °This sheet gives you information about how to care for yourself after your procedure. Your health care provider may also give you more specific instructions. If you have problems or questions, contact your health care provider. °What can I expect after the procedure? °After the procedure, it is common to have: °· Bruising that usually fades within 1-2 weeks. °· Tenderness at the site. °Follow these instructions at home: °Wound care °· Follow instructions from your health care provider about how to take care of your insertion site. Make sure you: °? Wash your hands with soap and water before you change your bandage (dressing). If soap and water are not available, use hand sanitizer. °? Change your dressing as told by your health care provider. °? Leave stitches (sutures), skin glue, or adhesive strips in place. These skin closures may need to stay in place for 2 weeks or longer. If adhesive strip edges start to loosen and curl up, you may trim the loose edges. Do not remove adhesive strips completely unless your health care provider tells you to do that. °· Do not take baths, swim, or use a hot tub until your health care provider approves. °· You may shower 24-48 hours after the procedure or as told by your health care provider. °? Gently wash the site with plain soap and water. °? Pat the area dry with a clean towel. °? Do not rub the site. This may cause bleeding. °· Do not apply powder or lotion to the site. Keep the site clean and dry. °· Check your femoral site every day for signs of infection. Check for: °? Redness, swelling, or pain. °? Fluid or blood. °? Warmth. °? Pus or a bad smell. °Activity °· For the first 2-3 days after your procedure, or as long as directed: °? Avoid climbing stairs as much as possible. °? Do not squat. °· Do not lift anything that is heavier than 10 lb (4.5 kg), or the limit that you are told, until your health care provider says that it is safe. °· Rest as  directed. °? Avoid sitting for a long time without moving. Get up to take short walks every 1-2 hours. °· Do not drive for 24 hours if you were given a medicine to help you relax (sedative). °General instructions °· Take over-the-counter and prescription medicines only as told by your health care provider. °· Keep all follow-up visits as told by your health care provider. This is important. °Contact a health care provider if you have: °· A fever or chills. °· You have redness, swelling, or pain around your insertion site. °Get help right away if: °· The catheter insertion area swells very fast. °· You pass out. °· You suddenly start to sweat or your skin gets clammy. °· The catheter insertion area is bleeding, and the bleeding does not stop when you hold steady pressure on the area. °· The area near or just beyond the catheter insertion site becomes pale, cool, tingly, or numb. °These symptoms may represent a serious problem that is an emergency. Do not wait to see if the symptoms will go away. Get medical help right away. Call your local emergency services (911 in the [place] Do not drive yourself to the hospital. °Summary °· After the procedure, it is common to have bruising that usually fades within 1-2 weeks. °· Check your femoral site every day for signs of infection. °· Do not lift anything that is heavier than 10 lb (4.5 kg), or the   limit that you are told, until your health care provider says that it is safe. °This information is not intended to replace advice given to you by your health care provider. Make sure you discuss any questions you have with your health care provider. °Document Released: 02/13/2014 Document Revised: 06/25/2017 Document Reviewed: 06/25/2017 °Elsevier Interactive Patient Education © 2019 [employer] ° °

## 2018-06-12 NOTE — H&P (Signed)
History and Physical Interval Note:  06/12/2018 10:26 AM  Nathan Cook  has presented today for surgery, with the diagnosis of pvd ulcer  The various methods of treatment have been discussed with the patient and family. After consideration of risks, benefits and other options for treatment, the patient has consented to  Procedure(s): ABDOMINAL AORTOGRAM W/LOWER EXTREMITY (N/A) as a surgical intervention .  The patient's history has been reviewed, patient examined, no change in status, stable for surgery.  I have reviewed the patient's chart and labs.  Questions were answered to the patient's satisfaction.    Aortogram, bilateral arteriogram.  Intervene on left leg.   Marty Heck  REASON FOR CONSULT: Evaluate for PAD  HPI: Nathan Cook is a 84 y.o. male, with multiple medical problems including diabetes, hyperlipidemia, and recent diagnosis of A. fib, and recent stroke who presents for one month followup for left great toe.  Was initially referred for claudication.  Unfortunately when I saw him last month he had a recent stroke and was admitted to the hospital with residual right-sided weakness that required TPA.  He was in a wheelchair.   Now he appears to have progression of left great toe wound.  Has made progress with therapy and walking with walker.      Past Medical History:  Diagnosis Date  . BPH (benign prostatic hyperplasia) 10/14/2014  . Cervicalgia 01/04/2016  . Chronic kidney disease   . Contusion of left shoulder 05/27/14  . DM type 2 (diabetes mellitus, type 2) ([place])   . Dysphagia   . High blood pressure   . History of basal cell cancer 11/21/2005   Right temple  . History of colonic polyps 10/14/2003  . Hyperlipidemia   . Left knee pain 05/27/14  . Peripheral vascular disease ([place])   . SAH (subarachnoid hemorrhage) ([place]) 2009   TBI  . Stroke ([place])   . TIA (transient ischemic attack) 11/06/2005   Left eye pain. Slurred speech. Diaphoresis.  No residual effects.          Past Surgical History:  Procedure Laterality Date  . Achilles tendon repair Left 1998   ruptured tendon  . COLONOSCOPY  1992   Polyp removed  . COLONOSCOPY  1998   normal  . ORIF FEMUR FRACTURE Right 1929  . TONSILLECTOMY           Family History  Problem Relation Age of Onset  . Heart attack Mother   . Heart disease Mother   . Heart disease Father     SOCIAL HISTORY: Social History        Socioeconomic History  . Marital status: Married    Spouse name: Not on file  . Number of children: Not on file  . Years of education: Not on file  . Highest education level: Not on file  Occupational History  . Occupation: retired Copy  . Financial resource strain: Not on file  . Food insecurity:    Worry: Not on file    Inability: Not on file  . Transportation needs:    Medical: Not on file    Non-medical: Not on file  Tobacco Use  . Smoking status: Former Smoker    Last attempt to quit: 06/26/1971    Years since quitting: 46.9  . Smokeless tobacco: Never Used  . Tobacco comment: 2 1/2 packs a day, until 1972  Substance and Sexual Activity  . Alcohol use: Yes    Alcohol/week: 1.0 -  2.0 standard drinks    Types: 1 - 2 Glasses of wine per week    Comment: Wine nightly  . Drug use: No  . Sexual activity: Not on file  Lifestyle  . Physical activity:    Days per week: Not on file    Minutes per session: Not on file  . Stress: Not on file  Relationships  . Social connections:    Talks on phone: Not on file    Gets together: Not on file    Attends religious service: Not on file    Active member of club or organization: Not on file    Attends meetings of clubs or organizations: Not on file    Relationship status: Not on file  . Intimate partner violence:    Fear of current or ex partner: Not on file    Emotionally abused: Not on file    Physically abused: Not  on file    Forced sexual activity: Not on file  Other Topics Concern  . Not on file  Social History Narrative   Patient lives at [facility] since Dec 2015   Caffeine- Coffee   Married- Yes, 1951   [employer] [employer] 1943-46   House- Apartment with 2 people   Pets- No   Current/past profession- Press photographer   Exercise- Yes, walking   Living will- Yes   DNR- Yes   POA/HPOA- Yes       No Known Allergies        Current Outpatient Medications  Medication Sig Dispense Refill  . acetaminophen (TYLENOL 8 HOUR) 650 MG CR tablet Take 1 tablet (650 mg total) by mouth every 8 (eight) hours as needed for pain. 60 tablet 0  . apixaban (ELIQUIS) 5 MG TABS tablet Take 1 tablet (5 mg total) by mouth 2 (two) times daily. 180 tablet 3  . Calcium Carb-Cholecalciferol (CALCIUM 600+D3 PO) Take 1 tablet by mouth daily.    . Cholecalciferol 1000 UNITS tablet Take 1,000 Units by mouth daily.    . clindamycin (CLEOCIN) 300 MG capsule Take 1 capsule by mouth 3 (three) times daily.     Marland Kitchen losartan (COZAAR) 25 MG tablet Take 25 mg by mouth 2 (two) times daily.     . metFORMIN (GLUCOPHAGE) 500 MG tablet Take 500 mg by mouth 2 (two) times daily with a meal.    . metoprolol tartrate (LOPRESSOR) 25 MG tablet Take 1 tablet (25 mg total) by mouth 2 (two) times daily. 180 tablet 1  . nitroGLYCERIN (NITRODUR - DOSED IN MG/24 HR) 0.2 mg/hr patch Place 1 patch (0.2 mg total) onto the skin daily. 10 patch 0  . Propylene Glycol-Glycerin 1-0.3 % SOLN Place 2 drops into both eyes as needed.    . simvastatin (ZOCOR) 10 MG tablet Take 10 mg by mouth daily.    Marland Kitchen SSD 1 % cream Apply 1 drop topically 2 (two) times daily.    Marland Kitchen triamcinolone lotion (KENALOG) 0.1 % Apply 1 application topically 3 (three) times daily. 60 mL 0  . zinc oxide 20 % ointment Apply 1 application topically as needed for irritation. To buttocks after every incontinent episode and as needed for redness. May keep at bedside.      No current facility-administered medications for this visit.     REVIEW OF SYSTEMS:  [X]  denotes positive finding, [ ]  denotes negative finding Cardiac  Comments:  Chest pain or chest pressure:    Shortness of breath upon exertion:    Short  of breath when lying flat:    Irregular heart rhythm:        Vascular    Pain in calf, thigh, or hip brought on by ambulation: x   Pain in feet at night that wakes you up from your sleep:     Blood clot in your veins:    Leg swelling:         Pulmonary    Oxygen at home:    Productive cough:     Wheezing:         Neurologic    Sudden weakness in arms or legs:     Sudden numbness in arms or legs:     Sudden onset of difficulty speaking or slurred speech:    Temporary loss of vision in one eye:     Problems with dizziness:         Gastrointestinal    Blood in stool:     Vomited blood:         Genitourinary    Burning when urinating:     Blood in urine:        Psychiatric    Major depression:         Hematologic    Bleeding problems:    Problems with blood clotting too easily:        Skin    Rashes or ulcers:        Constitutional    Fever or chills:      PHYSICAL EXAM:    Vitals:   06/11/18 1348  BP: 131/67  Pulse: 89  Resp: 20  Temp: (!) 97.5 F (36.4 C)  SpO2: 96%  Weight: 149 lb (67.6 kg)  Height: 6\' 1"  (1.854 m)    GENERAL: The patient is a well-nourished male, in no acute distress. The vital signs are documented above. CARDIAC: There is a regular rate and rhythm.  VASCULAR:  2+ left radial pulse, no right radial palpable 2+ femoral palpable bilateral groins No palpable pedal pulses Left great toe ulcer now about 1.5 cm in diameter PULMONARY: There is good air exchange bilaterally without wheezing or rales. ABDOMEN: Soft and non-tender with normal pitched bowel sounds.  MUSCULOSKELETAL: There are no  major deformities or cyanosis. NEUROLOGIC: Right sided weakness 3/5.   DATA:   I independently reviewed his noninvasive imaging that shows no tibial runoff in the right lower extremity with likely collateral flow and left SFA occlusion and single-vessel runoff in the left leg via peroneal.  Assessment/Plan:  On evaluation today for one month follow-up Mr. Cabreja has had significant progression of his left great toe wound.  When I saw him one month ago there was just a small linear cut here and he was still in a wheelchair after a stroke that had him hospitalized and looked very poor overall.  Today he has made progress with therapy and is using a walker.  I have recommended aortogram with lower extremity arteriogram tomorrow of the left leg and possible retrograde tibial access with attempted limb salvage.  Discussed with him and his wife and concerned if we do nothing he is at high risk for limb loss.  Risks and benefits were discussed with the patient including bleeding, infection, need for additional procedure, groin hematoma, groin pseudoaneurysm, etc.  I have asked that he hold his Eliquis tonight and tomorrow morning.  Marty Heck, MD [facility] Office: [phone] Pager: [place]

## 2018-06-12 NOTE — Telephone Encounter (Signed)
-----   Message from Marty Heck, MD sent at 06/12/2018  1:15 PM EST ----- 06/12/2018 Pre-operative Diagnosis: Critical limb ischemia of the bilateral lower extremities with tissue loss in the left great toe Post-operative diagnosis:  Same Surgeon:  Marty Heck, MD Assistant: Servando Snare, MD Procedure Performed: 1.  Ultrasound-guided access of the right common femoral artery 2.  Aortogram with bilateral lower extremity arteriogram 3.  Attempted retrograde cannulation of the left peroneal artery at the ankle using fluoroscopic guidance 4.  Mynx closure of the right common femoral artery 5.  70 minutes of monitored moderate conscious sedation time  #Can follow-up in one month for wound check.  Thanks,  Gerald Stabs

## 2018-06-12 NOTE — Telephone Encounter (Signed)
sch appt spk to pt mld ltr 07/09/2018 115am wound check MD

## 2018-06-13 ENCOUNTER — Telehealth: Payer: Self-pay

## 2018-06-13 ENCOUNTER — Encounter (HOSPITAL_COMMUNITY): Payer: Self-pay | Admitting: Vascular Surgery

## 2018-06-13 NOTE — Telephone Encounter (Signed)
Returned call to patient. He had pain in his ankle incision last night. Took Tylenol and elevated foot and pain is better this morning. Instructed patient to continue to use Tylenol as needed and elevate foot when at rest. Groin incision was good. No drainage or redness at either incision. Call back as needed.

## 2018-06-18 ENCOUNTER — Other Ambulatory Visit: Payer: Self-pay

## 2018-06-18 DIAGNOSIS — Z8673 Personal history of transient ischemic attack (TIA), and cerebral infarction without residual deficits: Secondary | ICD-10-CM

## 2018-06-18 DIAGNOSIS — I480 Paroxysmal atrial fibrillation: Secondary | ICD-10-CM

## 2018-06-18 MED ORDER — LOSARTAN POTASSIUM 25 MG PO TABS: 25.0000 mg | ORAL_TABLET | Freq: Two times a day (BID) | ORAL | 1 refills | Status: DC

## 2018-06-18 MED ORDER — APIXABAN 5 MG PO TABS: 5.0000 mg | ORAL_TABLET | Freq: Two times a day (BID) | ORAL | 3 refills | Status: DC

## 2018-06-18 MED ORDER — METOPROLOL TARTRATE 25 MG PO TABS: 25.0000 mg | ORAL_TABLET | Freq: Two times a day (BID) | ORAL | 1 refills | Status: DC

## 2018-06-18 MED ORDER — METFORMIN HCL 500 MG PO TABS: 500.0000 mg | ORAL_TABLET | Freq: Two times a day (BID) | ORAL | 1 refills | Status: DC

## 2018-06-22 ENCOUNTER — Other Ambulatory Visit: Payer: Self-pay | Admitting: Family

## 2018-06-22 DIAGNOSIS — E119 Type 2 diabetes mellitus without complications: Secondary | ICD-10-CM

## 2018-06-25 ENCOUNTER — Telehealth: Payer: Self-pay

## 2018-06-25 NOTE — Telephone Encounter (Signed)
Patient stopped by the clinic today complaining of his big right toe not responding to the treatment that was prescribed over the last several months. Please advise on what patient should do at this point

## 2018-06-25 NOTE — Telephone Encounter (Signed)
Patient stop by clinic.  Looking at his toe there is no evidence of infection and I told him he could stop antibiotic that he had been taking orally

## 2018-06-27 ENCOUNTER — Other Ambulatory Visit: Payer: Self-pay

## 2018-06-27 NOTE — Patient Outreach (Signed)
First attempt to obtain mRs. No answer, no voicemail at main number. Left message for return call at second number. Call went straight to voicemail.

## 2018-07-02 ENCOUNTER — Other Ambulatory Visit: Payer: Self-pay

## 2018-07-02 ENCOUNTER — Encounter: Payer: Self-pay | Admitting: Family

## 2018-07-02 NOTE — Patient Outreach (Signed)
Telephone outreach to patient to obtain mRs was successfully completed. mRs= 4. Spoke with daughter [place], per DPR, to obtain score.

## 2018-07-02 NOTE — Patient Outreach (Signed)
Second attempt to obtain mRs. No answer, no voicemail at main number. Left message at secondary number. Called daughter Nathan Cook (on [place]) and left a voicemail.

## 2018-07-03 ENCOUNTER — Encounter: Payer: Medicare Other | Admitting: Family Medicine

## 2018-07-09 ENCOUNTER — Encounter: Payer: Self-pay | Admitting: Vascular Surgery

## 2018-07-09 ENCOUNTER — Other Ambulatory Visit: Payer: Self-pay

## 2018-07-09 ENCOUNTER — Ambulatory Visit (INDEPENDENT_AMBULATORY_CARE_PROVIDER_SITE_OTHER): Payer: Medicare Other | Admitting: Vascular Surgery

## 2018-07-09 VITALS — BP 150/69 | HR 60 | Temp 97.2°F | Resp 16 | Ht 68.0 in | Wt 143.0 lb

## 2018-07-09 DIAGNOSIS — I739 Peripheral vascular disease, unspecified: Secondary | ICD-10-CM | POA: Diagnosis not present

## 2018-07-09 NOTE — Progress Notes (Signed)
Patient name: Nathan Cook MRN: [id number] DOB: March 13, 1922 Sex: male  REASON FOR VISIT: Follow-up left great toe wound  HPI: Nathan Cook is a 85 y.o. male that presents for short interval follow-up for left great toe ulcerated wound that is nonhealing.  He previously underwent left lower extremity arteriogram on 06/12/2018 and ultimately had pretty severe disease with single peroneal vessel that reconstituted.  Ultimately we were unsuccessful with an attempted antegrade crossing for recanalization and attempted retrograde left peroneal unsuccessfully.  Do not think he has any other revascularization options of the left leg but he presents today for interval follow-up.  He also has severe vascular disease in his right leg but states he has no significant rest pain symptoms and has no wound at this time.  Past Medical History:  Diagnosis Date  . BPH (benign prostatic hyperplasia) 10/14/2014  . Cervicalgia 01/04/2016  . Chronic kidney disease   . Contusion of left shoulder 05/27/14  . DM type 2 (diabetes mellitus, type 2) ([place])   . Dysphagia   . High blood pressure   . History of basal cell cancer 11/21/2005   Right temple  . History of colonic polyps 10/14/2003  . Hyperlipidemia   . Left knee pain 05/27/14  . Peripheral vascular disease ([place])   . SAH (subarachnoid hemorrhage) ([place]) 2009   TBI  . Stroke ([place])   . TIA (transient ischemic attack) 11/06/2005   Left eye pain. Slurred speech. Diaphoresis. No residual effects.     Past Surgical History:  Procedure Laterality Date  . ABDOMINAL AORTOGRAM W/LOWER EXTREMITY N/A 06/12/2018   Procedure: ABDOMINAL AORTOGRAM W/LOWER EXTREMITY;  Surgeon: Marty Heck, MD;  Location: [facility];  Service: Cardiovascular;  Laterality: N/A;  . Achilles tendon repair Left 1998   ruptured tendon  . COLONOSCOPY  1992   Polyp removed  . COLONOSCOPY  1998   normal  . ORIF FEMUR FRACTURE Right 1929  . PERIPHERAL VASCULAR  INTERVENTION Left 06/12/2018   Procedure: PERIPHERAL VASCULAR INTERVENTION;  Surgeon: Marty Heck, MD;  Location: [facility];  Service: Cardiovascular;  Laterality: Left;  Attempted   . TONSILLECTOMY      Family History  Problem Relation Age of Onset  . Heart attack Mother   . Heart disease Mother   . Heart disease Father     SOCIAL HISTORY: Social History   Tobacco Use  . Smoking status: Former Smoker    Last attempt to quit: 06/26/1971    Years since quitting: 47.0  . Smokeless tobacco: Never Used  . Tobacco comment: 2 1/2 packs a day, until 1972  Substance Use Topics  . Alcohol use: Yes    Alcohol/week: 1.0 - 2.0 standard drinks    Types: 1 - 2 Glasses of wine per week    Comment: Wine nightly    No Known Allergies  Current Outpatient Medications  Medication Sig Dispense Refill  . acetaminophen (TYLENOL 8 HOUR) 650 MG CR tablet Take 1 tablet (650 mg total) by mouth every 8 (eight) hours as needed for pain. 60 tablet 0  . apixaban (ELIQUIS) 5 MG TABS tablet Take 1 tablet (5 mg total) by mouth 2 (two) times daily. 180 tablet 3  . Calcium Carb-Cholecalciferol (CALCIUM 600+D3 PO) Take 1 tablet by mouth daily.    . Cholecalciferol 1000 UNITS tablet Take 1,000 Units by mouth daily.    Marland Kitchen losartan (COZAAR) 25 MG tablet Take 1 tablet (25 mg total) by mouth 2 (  two) times daily. 180 tablet 1  . metFORMIN (GLUCOPHAGE) 500 MG tablet Take 1 tablet (500 mg total) by mouth 2 (two) times daily with a meal. 180 tablet 1  . metoprolol tartrate (LOPRESSOR) 25 MG tablet Take 1 tablet (25 mg total) by mouth 2 (two) times daily. 180 tablet 1  . Propylene Glycol-Glycerin 1-0.3 % SOLN Place 2 drops into both eyes as needed (dry eyes).     . simvastatin (ZOCOR) 10 MG tablet Take 10 mg by mouth daily.    Marland Kitchen triamcinolone lotion (KENALOG) 0.1 % Apply 1 application topically 3 (three) times daily. 60 mL 0  . clindamycin (CLEOCIN) 300 MG capsule Take 1 capsule by mouth 3 (three) times  daily.      No current facility-administered medications for this visit.     REVIEW OF SYSTEMS:  [X]  denotes positive finding, [ ]  denotes negative finding Cardiac  Comments:  Chest pain or chest pressure:    Shortness of breath upon exertion:    Short of breath when lying flat:    Irregular heart rhythm:        Vascular    Pain in calf, thigh, or hip brought on by ambulation:    Pain in feet at night that wakes you up from your sleep:     Blood clot in your veins:    Leg swelling:         Pulmonary    Oxygen at home:    Productive cough:     Wheezing:         Neurologic    Sudden weakness in arms or legs:     Sudden numbness in arms or legs:     Sudden onset of difficulty speaking or slurred speech:    Temporary loss of vision in one eye:     Problems with dizziness:         Gastrointestinal    Blood in stool:     Vomited blood:         Genitourinary    Burning when urinating:     Blood in urine:        Psychiatric    Major depression:         Hematologic    Bleeding problems:    Problems with blood clotting too easily:        Skin    Rashes or ulcers:        Constitutional    Fever or chills:      PHYSICAL EXAM: Vitals:   07/09/18 1111  BP: (!) 150/69  Pulse: 60  Resp: 16  Temp: (!) 97.2 F (36.2 C)  TempSrc: Oral  SpO2: 95%  Weight: 143 lb (64.9 kg)  Height: 5\' 8"  (1.727 m)    GENERAL: The patient is a well-nourished male, in no acute distress. The vital signs are documented above. VASCULAR:  2+ femoral pulse bilateral groins Left great toe ulcerated wound on tip, dependent rubor in foot Monophasic peroneal signal No tissue loss in right foot   DATA:   None  Assessment/Plan:  Mr. Enck has nonhealing left great toe wound in the setting of critical limb ischemia.  We previously attempted antegrade and retrograde recanalization unsuccessfully of the left leg including an attempt at retrograde peroneal access.  I have discussed with  him and his daughter that I do not feel like we have any other revascularization options and they are very understanding.  Ultimately they are agreeable to palliative wound care at this  time and he is putting Silvadene on the toe per wound care specialist that he has seen.  I discussed that from my standpoint the only other option would be a higher level amputation which he is not interested in at this time.  Discussed he will not heal a toe amputation alone.  He will contact our office if things get worse.  I offered follow-up and daughter states they will call instead.    Marty Heck, MD [facility] Office: [phone] Pager: [phone]

## 2018-07-13 ENCOUNTER — Other Ambulatory Visit: Payer: Self-pay | Admitting: Family Medicine

## 2018-07-16 ENCOUNTER — Encounter: Payer: Self-pay | Admitting: *Deleted

## 2018-07-16 NOTE — Telephone Encounter (Signed)
error 

## 2018-07-19 ENCOUNTER — Other Ambulatory Visit: Payer: Self-pay | Admitting: Family Medicine

## 2018-07-19 MED ORDER — SILVER SULFADIAZINE 1 % EX CREA: 1.0000 "application " | TOPICAL_CREAM | Freq: Every day | CUTANEOUS | 0 refills | Status: DC

## 2018-07-19 NOTE — Telephone Encounter (Signed)
Patient was previously on Silvadene 1% cream in the past  and would like a refill.Is it okay to refill the cream?

## 2018-07-24 ENCOUNTER — Non-Acute Institutional Stay: Payer: Medicare Other | Admitting: Internal Medicine

## 2018-07-24 ENCOUNTER — Encounter: Payer: Self-pay | Admitting: Internal Medicine

## 2018-07-24 VITALS — BP 144/68 | HR 69 | Ht 68.0 in | Wt 154.0 lb

## 2018-07-24 DIAGNOSIS — S91109S Unspecified open wound of unspecified toe(s) without damage to nail, sequela: Secondary | ICD-10-CM

## 2018-07-24 DIAGNOSIS — E785 Hyperlipidemia, unspecified: Secondary | ICD-10-CM

## 2018-07-24 DIAGNOSIS — I739 Peripheral vascular disease, unspecified: Secondary | ICD-10-CM

## 2018-07-24 DIAGNOSIS — E1122 Type 2 diabetes mellitus with diabetic chronic kidney disease: Secondary | ICD-10-CM

## 2018-07-24 DIAGNOSIS — I480 Paroxysmal atrial fibrillation: Secondary | ICD-10-CM | POA: Diagnosis not present

## 2018-07-24 DIAGNOSIS — I6300 Cerebral infarction due to thrombosis of unspecified precerebral artery: Secondary | ICD-10-CM

## 2018-07-24 DIAGNOSIS — S91101A Unspecified open wound of right great toe without damage to nail, initial encounter: Secondary | ICD-10-CM | POA: Insufficient documentation

## 2018-07-24 DIAGNOSIS — N183 Chronic kidney disease, stage 3 unspecified: Secondary | ICD-10-CM

## 2018-07-24 MED ORDER — GABAPENTIN 100 MG PO CAPS: 100.0000 mg | ORAL_CAPSULE | Freq: Three times a day (TID) | ORAL | 0 refills | Status: DC

## 2018-07-24 MED ORDER — TRIAMCINOLONE ACETONIDE 0.1 % EX LOTN: TOPICAL_LOTION | CUTANEOUS | 0 refills | Status: DC

## 2018-07-24 MED ORDER — COLLAGENASE 250 UNIT/GM EX OINT: 1.0000 "application " | TOPICAL_OINTMENT | Freq: Every day | CUTANEOUS | 0 refills | Status: DC

## 2018-07-24 MED ORDER — GABAPENTIN 100 MG PO CAPS: 100.0000 mg | ORAL_CAPSULE | Freq: Every day | ORAL | 0 refills | Status: DC

## 2018-07-24 NOTE — Progress Notes (Signed)
Location:  [place] of Service:  Clinic (12)  Provider:   Code Status: Goals of Care:  Advanced Directives 07/24/2018  Does Patient Have a Medical Advance Directive? Yes  Type of Paramedic of [place];Living will  Does patient want to make changes to medical advance directive? No - Patient declined  Copy of [place] in Chart? Yes - validated most recent copy scanned in chart (See row information)     Chief Complaint  Patient presents with  . Acute Visit    left toe not healing     HPI: Patient is a 85 y.o. Cook seen today for medical management of chronic diseases.   Patient has h/o Hypertension, Hyperlipidemia, Diabetes mellitus, h/o Brain stem Infarct s/p TPA  In 10/19, New Onset Atrial Fibrillation on Eliquis since 10/19.,PAD   Patient has non healing Ulcer on his Left Great Toe. He was recently Seen by Vascular.Per their notes Patient has failed  revascularization . And at this time he is not interested in Amputation. Plan is for Palliative Wound Care. Patient has Claudication pain in his legs. He also describes Tingling Sensation in both his legs. Especially at night. He also has pain in that toe. He Uses silver on his Wound and Triamcinolone on his Feet which has helped his Redness. Patient walks with the walker. Lives in [place] with his Wife who has dementia . He takes care of her. He sya his BS runs good at home.  Past Medical History:  Diagnosis Date  . BPH (benign prostatic hyperplasia) 10/14/2014  . Cervicalgia 01/04/2016  . Chronic kidney disease   . Contusion of left shoulder 05/27/14  . DM type 2 (diabetes mellitus, type 2) ([place])   . Dysphagia   . High blood pressure   . History of basal cell cancer 11/21/2005   Right temple  . History of colonic polyps 10/14/2003  . Hyperlipidemia   . Left knee pain 05/27/14  . Peripheral vascular disease ([place])   . SAH (subarachnoid hemorrhage) ([place]) 2009   TBI  .  Stroke ([place])   . TIA (transient ischemic attack) 11/06/2005   Left eye pain. Slurred speech. Diaphoresis. No residual effects.     Past Surgical History:  Procedure Laterality Date  . ABDOMINAL AORTOGRAM W/LOWER EXTREMITY N/A 06/12/2018   Procedure: ABDOMINAL AORTOGRAM W/LOWER EXTREMITY;  Surgeon: Marty Heck, MD;  Location: [facility];  Service: Cardiovascular;  Laterality: N/A;  . Achilles tendon repair Left 1998   ruptured tendon  . COLONOSCOPY  1992   Polyp removed  . COLONOSCOPY  1998   normal  . ORIF FEMUR FRACTURE Right 1929  . PERIPHERAL VASCULAR INTERVENTION Left 06/12/2018   Procedure: PERIPHERAL VASCULAR INTERVENTION;  Surgeon: Marty Heck, MD;  Location: [facility];  Service: Cardiovascular;  Laterality: Left;  Attempted   . TONSILLECTOMY      No Known Allergies  Outpatient Encounter Medications as of 07/24/2018  Medication Sig  . acetaminophen (TYLENOL 8 HOUR) 650 MG CR tablet Take 1 tablet (650 mg total) by mouth every 8 (eight) hours as needed for pain.  Marland Kitchen apixaban (ELIQUIS) 5 MG TABS tablet Take 1 tablet (5 mg total) by mouth 2 (two) times daily.  . Calcium Carb-Cholecalciferol (CALCIUM 600+D3 PO) Take 1 tablet by mouth daily.  . Cholecalciferol 1000 UNITS tablet Take 1,000 Units by mouth daily.  . clindamycin (CLEOCIN) 300 MG capsule Take 1 capsule by mouth 3 (  three) times daily.   Marland Kitchen losartan (COZAAR) 25 MG tablet Take 1 tablet (25 mg total) by mouth 2 (two) times daily.  . metFORMIN (GLUCOPHAGE) 500 MG tablet Take 1 tablet (500 mg total) by mouth 2 (two) times daily with a meal.  . metoprolol tartrate (LOPRESSOR) 25 MG tablet Take 1 tablet (25 mg total) by mouth 2 (two) times daily.  Marland Kitchen Propylene Glycol-Glycerin 1-0.3 % SOLN Place 2 drops into both eyes as needed (dry eyes).   . silver sulfADIAZINE (SILVADENE) 1 % cream Apply 1 application topically daily.  . simvastatin (ZOCOR) 10 MG tablet Take 10 mg by mouth daily.  Marland Kitchen  triamcinolone lotion (KENALOG) 0.1 % APPLY TO AFFECTED AREA(S) THREE TIMES A DAY  . [DISCONTINUED] triamcinolone lotion (KENALOG) 0.1 % APPLY TO AFFECTED AREA(S) THREE TIMES A DAY  . collagenase (SANTYL) ointment Apply 1 application topically daily.  Marland Kitchen gabapentin (NEURONTIN) 100 MG capsule Take 1 capsule (100 mg total) by mouth 3 (three) times daily.   No facility-administered encounter medications on file as of 07/24/2018.     Review of Systems:  Review of Systems  Constitutional: Negative.   HENT: Negative.   Respiratory: Negative.   Cardiovascular: Negative.   Gastrointestinal: Negative.   Genitourinary: Negative.   Musculoskeletal: Negative.   Skin: Positive for wound.  Neurological: Negative.   Psychiatric/Behavioral: Negative.   All other systems reviewed and are negative.   Health Maintenance  Topic Date Due  . OPHTHALMOLOGY EXAM  07/07/2017  . FOOT EXAM  06/27/2018  . HEMOGLOBIN A1C  10/02/2018  . TETANUS/TDAP  04/29/2027  . INFLUENZA VACCINE  Completed  . PNA vac Low Risk Adult  Completed    Physical Exam: Vitals:   07/24/18 1507  BP: (!) 144/68  Pulse: 69  SpO2: 95%  Weight: 154 lb (69.9 kg)  Height: 5\' 8"  (1.727 m)   Body mass index is 23.42 kg/m. Physical Exam Vitals signs reviewed.  Constitutional:      Appearance: Normal appearance.  HENT:     Head: Normocephalic.     Nose: Nose normal.     Mouth/Throat:     Mouth: Mucous membranes are moist.     Pharynx: Oropharynx is clear.  Neck:     Musculoskeletal: Neck supple.  Cardiovascular:     Rate and Rhythm: Normal rate and regular rhythm.     Heart sounds: Murmur present.  Pulmonary:     Effort: Pulmonary effort is normal. No respiratory distress.     Breath sounds: Normal breath sounds. No wheezing or rales.  Abdominal:     General: Abdomen is flat. There is no distension.     Palpations: Abdomen is soft.     Tenderness: There is no abdominal tenderness. There is no guarding.    Musculoskeletal:     Comments: Chronic Venous Changes Bilateral  Skin:    Comments: Left Foot Exam Was warm had feeble Pulses Had Non Healing Toe Ulcer with Redness around it. And eschar on the Base. No Smell or Discharge  Neurological:     Mental Status: He is alert and oriented to person, place, and time.     Comments: Walks with the walker with no recent Falls Has mild weakness in Right UE  Psychiatric:        Mood and Affect: Mood normal.        Behavior: Behavior normal.        Thought Content: Thought content normal.     Labs reviewed: Basic Metabolic Panel:  Recent Labs    04/03/18 0304 04/04/18 0426 04/06/18 0348 06/12/18 0904  NA 136 138 141 137  K 3.7 3.8 3.7 4.1  CL 105 105 110 102  CO2 22 18* 24  --   GLUCOSE 148* 156* 141* 142*  BUN 28* 28* 24* 15  CREATININE 1.Nathan* 1.30* 1.10 1.30*  CALCIUM 8.6* 8.4* 8.4*  --    Liver Function Tests: Recent Labs    09/10/17 0750 03/13/18 0000 04/01/18 1432  AST 18 24 25   ALT 21 26 19   ALKPHOS  --   --  57  BILITOT 0.7 0.9 0.9  PROT 6.5 6.6 6.6  ALBUMIN  --   --  3.7   No results for input(s): LIPASE, AMYLASE in the last 8760 hours. No results for input(s): AMMONIA in the last 8760 hours. CBC: Recent Labs    04/01/18 1432  04/03/18 0304 04/04/18 0426 04/06/18 0348 06/12/18 0904  WBC 8.9   < > 24.0* 15.2* 12.4*  --   NEUTROABS 6.3  --   --   --   --   --   HGB 12.7*   < > 12.0* 13.3 13.5 11.9*  HCT 38.4*   < > Nathan.2* 40.0 40.5 35.0*  MCV 104.9*   < > 100.3* 101.8* 102.0*  --   PLT 317   < > 365 338 336  --    < > = values in this interval not displayed.   Lipid Panel: Recent Labs    09/10/17 0750 03/13/18 0000 04/02/18 0259  CHOL 153 154 143  HDL 51 57 57  LDLCALC 83 78 80  TRIG 91 104 29  CHOLHDL 3.0 2.7 2.5   Lab Results  Component Value Date   HGBA1C 7.6 (H) 04/02/2018    Procedures since last visit: No results found.  Assessment/Plan  PAD (peripheral artery disease)  Conservative  Management right now per Vascular On Statin and Eliquis Blood sugar Controlled  Non-healing open wound of toe,  Discontinue Silver and will start on Santyl QD Can Continue Triamcinolone for Foot  Return in 4 weeks for Follow up  Controlled type 2 diabetes mellitus  On Metformin Last A1C wwas 7.6 Repeat A1C Neuropathy Patient willing to try Neurontin Paroxysmal atrial fibrillation  Tolerating Eliquis On Metoprolol  Cerebrovascular accident (CVA) Recovered well  Continue on Statin BP controlled On Eliquis  CKD (chronic kidney disease) stage 3,  Creat stable Repeat BMP  Hyperlipidemia LDL goal <70 On Zocor Repeat Lipid  Hypertension On Losartan and Metoprolol BP mildly High today Will follow    Labs/tests ordered:  @ORDERS @ Next appt:  10/07/2018

## 2018-07-30 ENCOUNTER — Other Ambulatory Visit: Payer: Self-pay | Admitting: *Deleted

## 2018-07-30 MED ORDER — SIMVASTATIN 10 MG PO TABS: 10.0000 mg | ORAL_TABLET | Freq: Every day | ORAL | 0 refills | Status: DC

## 2018-07-30 NOTE — Telephone Encounter (Signed)
Nathan Cook 

## 2018-08-07 ENCOUNTER — Non-Acute Institutional Stay: Payer: Medicare Other | Admitting: Internal Medicine

## 2018-08-07 ENCOUNTER — Encounter: Payer: Self-pay | Admitting: Internal Medicine

## 2018-08-07 VITALS — BP 126/60 | HR 73 | Ht 68.0 in | Wt 157.2 lb

## 2018-08-07 DIAGNOSIS — E1122 Type 2 diabetes mellitus with diabetic chronic kidney disease: Secondary | ICD-10-CM | POA: Diagnosis not present

## 2018-08-07 DIAGNOSIS — I739 Peripheral vascular disease, unspecified: Secondary | ICD-10-CM

## 2018-08-07 DIAGNOSIS — S91109S Unspecified open wound of unspecified toe(s) without damage to nail, sequela: Secondary | ICD-10-CM

## 2018-08-07 DIAGNOSIS — N183 Chronic kidney disease, stage 3 (moderate): Secondary | ICD-10-CM | POA: Diagnosis not present

## 2018-08-08 ENCOUNTER — Encounter: Payer: Self-pay | Admitting: Adult Health

## 2018-08-08 ENCOUNTER — Ambulatory Visit: Payer: Medicare Other | Admitting: Adult Health

## 2018-08-08 VITALS — BP 128/76 | HR 60 | Ht 73.0 in | Wt 157.0 lb

## 2018-08-08 DIAGNOSIS — E785 Hyperlipidemia, unspecified: Secondary | ICD-10-CM | POA: Diagnosis not present

## 2018-08-08 DIAGNOSIS — I63412 Cerebral infarction due to embolism of left middle cerebral artery: Secondary | ICD-10-CM | POA: Diagnosis not present

## 2018-08-08 DIAGNOSIS — I10 Essential (primary) hypertension: Secondary | ICD-10-CM | POA: Diagnosis not present

## 2018-08-08 DIAGNOSIS — I480 Paroxysmal atrial fibrillation: Secondary | ICD-10-CM

## 2018-08-08 DIAGNOSIS — E1122 Type 2 diabetes mellitus with diabetic chronic kidney disease: Secondary | ICD-10-CM

## 2018-08-08 DIAGNOSIS — I739 Peripheral vascular disease, unspecified: Secondary | ICD-10-CM

## 2018-08-08 DIAGNOSIS — N183 Chronic kidney disease, stage 3 (moderate): Secondary | ICD-10-CM

## 2018-08-08 NOTE — Patient Instructions (Signed)
Continue Eliquis (apixaban) daily  and Zocor  for secondary stroke prevention  Continue to follow up with PCP regarding cholesterol, blood pressure and diabetes management   Continue to follow up with cardiology and contact vascular surgery regarding bilateral swelling  Consider increase of gabapentin dose for continued pain in legs  Continue to monitor blood pressure at home  Maintain strict control of hypertension with blood pressure goal below 130/90, diabetes with hemoglobin A1c goal below 6.5% and cholesterol with LDL cholesterol (bad cholesterol) goal below 70 mg/dL. I also advised the patient to eat a healthy diet with plenty of whole grains, cereals, fruits and vegetables, exercise regularly and maintain ideal body weight.  Followup in the future with me in 6 months or call earlier if needed       Thank you for coming to see [place] at [facility]. I hope we have been able to provide you high quality care today.  You may receive a patient satisfaction survey over the next few weeks. We would appreciate your feedback and comments so that we may continue to improve ourselves and the health of our patients.

## 2018-08-08 NOTE — Progress Notes (Signed)
[facility] [address] [place]. [place] [address] [phone]       OFFICE FOLLOW UP NOTE  Nathan Cook Date of Birth:  [birth year] Medical Record Number:  [id number]   Reason for Referral:  hospital stroke follow up  CHIEF COMPLAINT:  Chief Complaint  Patient presents with  . Cerebrovascular Accident    dgtr-Ginny, "walking slsower, knees swelled, veins in bad shape in legs"  . Follow-up    3 month    HPI: 08/08/18 VISIT  Nathan Cook is a 85 year old male who is being seen today for routine follow-up after left central medullary infarct in 04/01/2018 and is accompanied by his daughter.  He continues to reside at [facility].  He has been stable from a stroke standpoint with residual mild dysarthria.  He continues on Eliquis without side effects of bleeding or bruising.  Continues on atorvastatin without side effects myalgias.  Blood pressure today 128/76. He greatest complaint today is bilateral lower extremity edema due to peripheral arterial disease. He has a follow up appt scheduled with cardiology on 08/15/2018.  He does not currently have follow-up appointment with vascular surgery as it was recommended to follow-up as needed.  He does endorse increased swelling has been affecting his walking and limiting mobility.  Denies new or worsening stroke/TIA symptoms.   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 History obtained from patient, daughter and chart review. Reviewed all radiology images and labs personally.  Nathan Cook a 85 y.o.malewith history of HTN, HLD, DM, PVD who was admitted for acute onsetdifficulty speech andswallowing,left-sidedataxia.  CT had reviewed  and was negative for acute abnormality therefore TPA was administered.  CTA head and neck showed  left VA occlusion at V4 segment and left PICA occlusion.  MRI brain reviewed showed left central medullary infarct along with left posterior temporal small SAH.  Per notes, after retrospective review of CT head, SAH present on initial CT had prior to TPA without evidence of change post TPA.  Repeat CT head on 04/06/2018 showed stable SAH.  Repeat CT had on 04/09/2018 showed stable left medullary subacute infarct with interval dispersion of small volume hemorrhage left temporal lobe and SAH left temporal convexity.  2D echo showed an EF of 60 to 65%.  Patient was found to be in PAF with RVR on telemetry and is recommended to initiate aspirin 81 mg and to start Eliquis in 1 week as long as he remains neurologically stable.  LDL 80 and recommended increasing simvastatin from 10 mg to 20 mg.  HTN stable during admission and recommended BP goal normotensive range.  A1c 7.6 and recommended tight glycemic control with close PCP follow-up for continued DM management.  Therapies recommended discharge to SNF due to continued deficits and was discharged in stable condition.  Patient is being seen today for hospital follow-up and is accompanied by his daughter.  He does continue to have dysarthria but has been improving.  He no longer has difficulty with swallowing and is currently on a regular diet.  He continues to participate in PT/OT/ST at [facility].  He was previously residing at [facility] and plans on returning once he is stable.  He is currently using wheelchair but is able to ambulate during PT sessions with rolling walker due to  continued right-sided ataxia.  He has been started on Eliquis and denies bleeding or bruising.  Continues simvastatin without side effects myalgias.  Blood pressure today satisfactory 118/62.  Glucose levels have been fluctuating but typically are not greater than 140.  No further concerns at this time.  Denies new or worsening stroke/TIA symptoms.   ROS:     14 system review of systems performed and negative with exception of ringing in ears, runny nose, eye discharge, cough, leg swelling, cold intolerance, joint pain, aching muscles, walking difficulty, itching, dizziness and agitation  PMH:  Past Medical History:  Diagnosis Date  . BPH (benign prostatic hyperplasia) 10/14/2014  . Cervicalgia 01/04/2016  . Chronic kidney disease   . Contusion of left shoulder 05/27/14  . DM type 2 (diabetes mellitus, type 2) ([place])   . Dysphagia   . High blood pressure   . History of basal cell cancer 11/21/2005   Right temple  . History of colonic polyps 10/14/2003  . Hyperlipidemia   . Left knee pain 05/27/14  . Peripheral vascular disease ([place])   . SAH (subarachnoid hemorrhage) ([place]) 2009   TBI  . Stroke ([place])   . TIA (transient ischemic attack) 11/06/2005   Left eye pain. Slurred speech. Diaphoresis. No residual effects.     PSH:  Past Surgical History:  Procedure Laterality Date  . ABDOMINAL AORTOGRAM W/LOWER EXTREMITY N/A 06/12/2018   Procedure: ABDOMINAL AORTOGRAM W/LOWER EXTREMITY;  Surgeon: Marty Heck, MD;  Location: [facility];  Service: Cardiovascular;  Laterality: N/A;  . Achilles tendon repair Left 1998   ruptured tendon  . COLONOSCOPY  1992   Polyp removed  . COLONOSCOPY  1998   normal  . ORIF FEMUR FRACTURE Right 1929  . PERIPHERAL VASCULAR INTERVENTION Left 06/12/2018   Procedure: PERIPHERAL VASCULAR INTERVENTION;  Surgeon: Marty Heck, MD;  Location: [facility];  Service: Cardiovascular;  Laterality: Left;  Attempted   . TONSILLECTOMY      Social History:  Social History   Socioeconomic History  . Marital status: Married    Spouse name: Not on file  . Number of children: Not on file  . Years of education: Not on file  . Highest education level: Not on file  Occupational History  . Occupation: retired Copy  . Financial resource strain: Not on file  . Food insecurity:     Worry: Not on file    Inability: Not on file  . Transportation needs:    Medical: Not on file    Non-medical: Not on file  Tobacco Use  . Smoking status: Former Smoker    Last attempt to quit: 06/26/1971    Years since quitting: 47.1  . Smokeless tobacco: Never Used  . Tobacco comment: 2 1/2 packs a day, until 1972  Substance and Sexual Activity  . Alcohol use: Yes    Alcohol/week: 1.0 - 2.0 standard drinks    Types: 1 - 2 Glasses of wine per week    Comment: Wine nightly  . Drug use: No  . Sexual activity: Not on file  Lifestyle  . Physical activity:    Days per week: Not on file    Minutes per session: Not on file  . Stress: Not on file  Relationships  . Social connections:    Talks on phone: Not on file    Gets together: Not on file    Attends religious service: Not on file    Active  member of club or organization: Not on file    Attends meetings of clubs or organizations: Not on file    Relationship status: Not on file  . Intimate partner violence:    Fear of current or ex partner: Not on file    Emotionally abused: Not on file    Physically abused: Not on file    Forced sexual activity: Not on file  Other Topics Concern  . Not on file  Social History Narrative   Patient lives at [facility] since Dec 2015   Caffeine- Coffee   Married- Yes, 1951   [employer] 1943-46   House- Apartment with 2 people   Pets- No   Current/past profession- Press photographer   Exercise- Yes, walking   Living will- Yes   DNR- Yes   POA/HPOA- Yes       Family History:  Family History  Problem Relation Age of Onset  . Heart attack Mother   . Heart disease Mother   . Heart disease Father     Medications:   Current Outpatient Medications on File Prior to Visit  Medication Sig Dispense Refill  . acetaminophen (TYLENOL 8 HOUR) 650 MG CR tablet Take 1 tablet (650 mg total) by mouth every 8 (eight) hours as needed for pain. 60 tablet 0  . apixaban (ELIQUIS) 5 MG TABS tablet Take 1  tablet (5 mg total) by mouth 2 (two) times daily. 180 tablet 3  . Calcium Carb-Cholecalciferol (CALCIUM 600+D3 PO) Take 1 tablet by mouth daily.    . Cholecalciferol 1000 UNITS tablet Take 1,000 Units by mouth daily.    . collagenase (SANTYL) ointment Apply 1 application topically daily. 15 g 0  . gabapentin (NEURONTIN) 100 MG capsule Take 1 capsule (100 mg total) by mouth at bedtime. 30 capsule 0  . losartan (COZAAR) 25 MG tablet Take 1 tablet (25 mg total) by mouth 2 (two) times daily. 180 tablet 1  . metFORMIN (GLUCOPHAGE) 500 MG tablet Take 1 tablet (500 mg total) by mouth 2 (two) times daily with a meal. 180 tablet 1  . metoprolol tartrate (LOPRESSOR) 25 MG tablet Take 1 tablet (25 mg total) by mouth 2 (two) times daily. 180 tablet 1  . Propylene Glycol-Glycerin 1-0.3 % SOLN Place 2 drops into both eyes as needed (dry eyes).     . simvastatin (ZOCOR) 10 MG tablet Take 1 tablet (10 mg total) by mouth daily. 90 tablet 0  . triamcinolone lotion (KENALOG) 0.1 % APPLY TO AFFECTED AREA(S) THREE TIMES A DAY 60 mL 0   No current facility-administered medications on file prior to visit.     Allergies:  No Known Allergies   Physical Exam  Vitals:   08/08/18 1351  BP: 128/76  Pulse: 60  Weight: 157 lb (71.2 kg)  Height: 6\' 1"  (1.854 m)   Body mass index is 20.71 kg/m. No exam data present  General: well developed, well nourished, pleasant elderly Caucasian male, seated, in no evident distress Head: head normocephalic and atraumatic.   Neck: supple with no carotid or supraclavicular bruits Cardiovascular: regular rate and rhythm, no murmurs; +4 pitting edema bilaterally located from mid thigh down to mid shin Musculoskeletal: no deformity Skin:  no rash/petichiae Vascular:  Normal pulses all extremities  Neurologic Exam Mental Status: Awake and fully alert.  Mild dysarthria.  Oriented to place and time. Recent and remote memory intact. Attention span, concentration and fund of  knowledge appropriate. Mood and affect appropriate.  Cranial Nerves: Fundoscopic  exam reveals sharp disc margins. Pupils equal, briskly reactive to light. Extraocular movements full without nystagmus. Visual fields full to confrontation. Hearing intact. Facial sensation intact. Face, tongue, palate moves normally and symmetrically.  Motor: Normal bulk and tone. Normal strength in all tested extremity muscles. Sensory.: intact to touch , pinprick , position and vibratory sensation.  Coordination: No ataxia present Gait and Station: Ambulates with rolling walker and short shuffling steps Reflexes: 1+ and symmetric. Toes downgoing.       Diagnostic Data (Labs, Imaging, Testing)  CT HEAD WO CONTRAST 04/01/2018 IMPRESSION: 1. Stable atrophy and white matter disease, likely within normal limits for age. 2. No acute intracranial abnormality. 3. Atherosclerosis. 4. ASPECTS is 10/10  CT ANGIO HEAD W OR WO CONTRAST CT ANGIO NECK W OR WO CONTRAST 04/01/2018 IMPRESSION: Mild atherosclerotic disease in the carotid bifurcation bilaterally. Atherosclerotic calcification and mild stenosis in the cavernous carotid bilaterally.  Moderate stenosis at the origin of the vertebral artery bilaterally. Occlusion distal left vertebral artery at the C1 level. Left PICA occluded. This could be acute or chronic. Correlate with neurologic examination and MRI.  CT HEAD WO CONTRAST 04/02/2018 24 hours post TPA IMPRESSION: 1. Small volume subarachnoid hemorrhage along the left temporoparietal convexity, in retrospect stable from yesterday. 2. No visible acute infarct.  MR BRAIN WO CONTRAST 04/02/2018 IMPRESSION: Acute LEFT medullary brainstem infarct correlating with recent LEFT vertebral and PICA occlusion. LEFT posterior temporal subarachnoid hemorrhage, uncertain etiology, appears to be increased volume from original CT, perhaps tPA related. Atrophy with chronic microvascular ischemic  change.  CT HEAD WO CONTRAST 04/09/2018 IMPRESSION: 1. Stable left medulla subacute infarction. 2. Interval partial dispersion of small volume of hemorrhage within the left temporal lobe and subarachnoid hemorrhage over the left temporal convexity. 3. No new acute intracranial abnormality. 4. Stable background of chronic microvascular ischemic changes and volume loss of the brain.  ECHOCARDIOGRAM 04/02/2018 Study Conclusions - Left ventricle: The cavity size was normal. Wall thickness was   normal. Systolic function was normal. The estimated ejection   fraction was in the range of 60% to 65%. Wall motion was normal;   there were no regional wall motion abnormalities. Doppler   parameters are consistent with abnormal left ventricular   relaxation (grade 1 diastolic dysfunction). - Aortic valve: Valve mobility was restricted. There was moderate   stenosis. There was mild regurgitation. Valve area (VTI): 1.04   cm^2. Valve area (Vmax): 0.82 cm^2. Valve area (Vmean): 0.99   cm^2.   ASSESSMENT: Nathan Cook is a 85 y.o. 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 Vascular risk factors include HTN, HLD, DM, PVD and newly diagnosed AF.  Patient is being seen today for hospital follow-up and does continue to have dysarthria and right upper and lower ataxia but overall has been improving and denies any new or worsening stroke/TIA symptoms.    PLAN:  1. Left central medullary infarct: Continue Eliquis (apixaban) daily  and simvastatin for secondary stroke prevention.  Advised to continue current therapies for continued deficits.  Maintain strict control of hypertension with blood pressure goal below 130/90, diabetes with hemoglobin A1c goal below 6.5% and cholesterol with LDL cholesterol (bad cholesterol) goal below 70 mg/dL.  I also advised the patient to eat a healthy diet with plenty of whole  grains, cereals, fruits and vegetables, exercise regularly with at least 30 minutes of continuous activity daily and maintain ideal body  weight.  2. PAF: Continue Eliquis.  Follow-up appointment scheduled with Dr. Harrell Gave on 05/13/2018 for PAF and [facility] management 3.   HTN: Advised to continue current treatment regimen.  Today's BP 118/62.  Advised to continue to monitor at home       along with continued follow-up with PCP for management 4.   HLD: Advised to continue current treatment regimen along with continued follow-up with PCP for future prescribing       and monitoring of lipid panel 5.   DMII: Advised to continue to monitor glucose levels at home along with continued follow-up with PCP for                   management and monitoring 6.  PAD: Recommended to follow-up with vascular surgery and regards to ongoing edema that is limiting mobility and causing extreme pain.  He was started on gabapentin on 07/24/2018 without much improvement.  Consider increase of medication.     Follow up in 6 months or call earlier if needed   Greater than 50% of time during this 25 minute visit was spent on counseling, explanation of diagnosis of left central medullary infarct, reviewing risk factor management of PAF on AC, HTN, HLD, DM, and PVD, planning of further management along with potential future management, and discussion with patient and family answering all questions.    Venancio Poisson, AGNP-BC  [facility] [address] [place], [place] [address]  Phone [phone] Fax [phone] Note: This document was prepared with digital dictation and possible smart phrase technology. Any transcriptional errors that result from this process are unintentional.

## 2018-08-08 NOTE — Progress Notes (Signed)
Agree with above note

## 2018-08-15 ENCOUNTER — Encounter: Payer: Self-pay | Admitting: Cardiology

## 2018-08-15 ENCOUNTER — Ambulatory Visit: Payer: Medicare Other | Admitting: Cardiology

## 2018-08-15 VITALS — BP 115/62 | HR 83 | Ht 68.5 in | Wt 158.0 lb

## 2018-08-15 DIAGNOSIS — Z79899 Other long term (current) drug therapy: Secondary | ICD-10-CM | POA: Diagnosis not present

## 2018-08-15 DIAGNOSIS — R600 Localized edema: Secondary | ICD-10-CM

## 2018-08-15 DIAGNOSIS — I480 Paroxysmal atrial fibrillation: Secondary | ICD-10-CM | POA: Diagnosis not present

## 2018-08-15 DIAGNOSIS — I739 Peripheral vascular disease, unspecified: Secondary | ICD-10-CM

## 2018-08-15 DIAGNOSIS — I6930 Unspecified sequelae of cerebral infarction: Secondary | ICD-10-CM | POA: Diagnosis not present

## 2018-08-15 MED ORDER — FUROSEMIDE 40 MG PO TABS: 40.0000 mg | ORAL_TABLET | Freq: Every day | ORAL | 11 refills | Status: DC | PRN

## 2018-08-15 NOTE — Progress Notes (Signed)
Cardiology Office Note:    Date:  08/16/2018   ID:  Nathan Cook, DOB 11/24/1921, MRN [id number]  PCP:  Virgie Dad, MD  Cardiologist:  Buford Dresser, MD PhD  Referring MD: Wardell Honour, MD   CC: follow up  History of Present Illness:    Nathan Cook is a 85 y.o. male with a hx of HTN, HLD, type II diabetes, peripheral vascular disease, recent CVA s/p tPA who is seen for follow up today.  Cardiac history: history of PVD, with critical limb ischemia and nonhealing foot wound s/p vascular surgery 06/12/18, but unfortunately the distal vessels could not be opened. He presented with acute CVA 03/2018 and received tPA for brainstem infarct. He had newly diagnosed atrial fibrillation during that hospitalization.  Today: Noticing increasing bilateral LE edema, tense. Occasional blisters with clear weeping. Trying to elevate, but he has vascular ischemia pain when he elevates his feet for too long. He has not had any major bleeding on apixaban. He is participating in therapies at [place] and is followed by [facility] senior care. Post-CVA symptoms have been stable to improving.  Denies chest pain, shortness of breath at rest or with normal exertion. No PND, orthopnea, or unexpected weight gain. No syncope or palpitations.  Past Medical History:  Diagnosis Date  . BPH (benign prostatic hyperplasia) 10/14/2014  . Cervicalgia 01/04/2016  . Chronic kidney disease   . Contusion of left shoulder 05/27/14  . DM type 2 (diabetes mellitus, type 2) ([place])   . Dysphagia   . High blood pressure   . History of basal cell cancer 11/21/2005   Right temple  . History of colonic polyps 10/14/2003  . Hyperlipidemia   . Left knee pain 05/27/14  . Peripheral vascular disease ([place])   . SAH (subarachnoid hemorrhage) ([place]) 2009   TBI  . Stroke ([place])   . TIA (transient ischemic attack) 11/06/2005   Left eye pain. Slurred speech. Diaphoresis. No residual effects.      Past Surgical History:  Procedure Laterality Date  . ABDOMINAL AORTOGRAM W/LOWER EXTREMITY N/A 06/12/2018   Procedure: ABDOMINAL AORTOGRAM W/LOWER EXTREMITY;  Surgeon: Marty Heck, MD;  Location: [place];  Service: Cardiovascular;  Laterality: N/A;  . Achilles tendon repair Left 1998   ruptured tendon  . COLONOSCOPY  1992   Polyp removed  . COLONOSCOPY  1998   normal  . ORIF FEMUR FRACTURE Right 1929  . PERIPHERAL VASCULAR INTERVENTION Left 06/12/2018   Procedure: PERIPHERAL VASCULAR INTERVENTION;  Surgeon: Marty Heck, MD;  Location: [place] [facility];  Service: Cardiovascular;  Laterality: Left;  Attempted   . TONSILLECTOMY      Current Medications: Current Outpatient Medications on File Prior to Visit  Medication Sig  . acetaminophen (TYLENOL 8 HOUR) 650 MG CR tablet Take 1 tablet (650 mg total) by mouth every 8 (eight) hours as needed for pain.  Marland Kitchen apixaban (ELIQUIS) 5 MG TABS tablet Take 1 tablet (5 mg total) by mouth 2 (two) times daily.  . Calcium Carb-Cholecalciferol (CALCIUM 600+D3 PO) Take 1 tablet by mouth daily.  . Cholecalciferol 1000 UNITS tablet Take 1,000 Units by mouth daily.  . collagenase (SANTYL) ointment Apply 1 application topically daily.  Marland Kitchen gabapentin (NEURONTIN) 100 MG capsule Take 1 capsule (100 mg total) by mouth at bedtime.  Marland Kitchen losartan (COZAAR) 25 MG tablet Take 1 tablet (25 mg total) by mouth 2 (two) times daily.  . metFORMIN (GLUCOPHAGE) 500 MG tablet  Take 1 tablet (500 mg total) by mouth 2 (two) times daily with a meal.  . metoprolol tartrate (LOPRESSOR) 25 MG tablet Take 1 tablet (25 mg total) by mouth 2 (two) times daily.  Marland Kitchen Propylene Glycol-Glycerin 1-0.3 % SOLN Place 2 drops into both eyes as needed (dry eyes).   . simvastatin (ZOCOR) 10 MG tablet Take 1 tablet (10 mg total) by mouth daily.  Marland Kitchen triamcinolone lotion (KENALOG) 0.1 % APPLY TO AFFECTED AREA(S) THREE TIMES A DAY   No current facility-administered  medications on file prior to visit.      Allergies:   Patient has no known allergies.   Social History   Socioeconomic History  . Marital status: Married    Spouse name: Not on file  . Number of children: Not on file  . Years of education: Not on file  . Highest education level: Not on file  Occupational History  . Occupation: retired Copy  . Financial resource strain: Not on file  . Food insecurity:    Worry: Not on file    Inability: Not on file  . Transportation needs:    Medical: Not on file    Non-medical: Not on file  Tobacco Use  . Smoking status: Former Smoker    Last attempt to quit: 06/26/1971    Years since quitting: 47.1  . Smokeless tobacco: Never Used  . Tobacco comment: 2 1/2 packs a day, until 1972  Substance and Sexual Activity  . Alcohol use: Yes    Alcohol/week: 1.0 - 2.0 standard drinks    Types: 1 - 2 Glasses of wine per week    Comment: Wine nightly  . Drug use: No  . Sexual activity: Not on file  Lifestyle  . Physical activity:    Days per week: Not on file    Minutes per session: Not on file  . Stress: Not on file  Relationships  . Social connections:    Talks on phone: Not on file    Gets together: Not on file    Attends religious service: Not on file    Active member of club or organization: Not on file    Attends meetings of clubs or organizations: Not on file    Relationship status: Not on file  Other Topics Concern  . Not on file  Social History Narrative   Patient [facility] since Dec [facility]   Caffeine- Coffee   Married- Yes, 1951   [employer] 1943-46   House- Apartment with 2 people   Pets- No   Current/past profession- Press photographer   Exercise- Yes, walking   Living will- Yes   DNR- Yes   POA/HPOA- Yes        Family History: The patient's family history includes Heart attack in his mother; Heart disease in his father and mother.  ROS:   Please see the history of present illness.  Additional  pertinent ROS:  Constitutional: Negative for chills, fever, night sweats, unintentional weight loss  HENT: Negative for ear pain and hearing loss.   Eyes: Negative for loss of vision and eye pain.  Respiratory: Positive for mild dry cough. Negative for sputum, shortness of breath, wheezing.   Cardiovascular: As per HPI. Gastrointestinal: Negative for abdominal pain, melena, and hematochezia.  Genitourinary: Negative for dysuria and hematuria.  Musculoskeletal: Negative for falls and myalgias.  Skin: Negative for itching and rash. Positive for toe wound, bilateral leg swelling. Neurological: Negative for focal sensory  changes and loss of consciousness. Positive for hoarseness Endo/Heme/Allergies: Does not bruise/bleed easily.    EKGs/Labs/Other Studies Reviewed:    The following studies were reviewed today: Echo 04/02/18 Study Conclusions - Left ventricle: The cavity size was normal. Wall thickness was   normal. Systolic function was normal. The estimated ejection   fraction was in the range of 60% to 65%. Wall motion was normal;   there were no regional wall motion abnormalities. Doppler   parameters are consistent with abnormal left ventricular   relaxation (grade 1 diastolic dysfunction). - Aortic valve: Valve mobility was restricted. There was moderate   stenosis. There was mild regurgitation. Valve area (VTI): 1.04   cm^2. Valve area (Vmax): 0.82 cm^2. Valve area (Vmean): 0.99   cm^2.  Impressions: - Normal LV systolic function; mild diastolic dysfunction;   calcified aortic valve with moderate AS (mean gradient 21 mmHg)   and mild AI.  EKG:  EKG is personally reviewed.  The ekg ordered today demonstrates sinus rhythm with RBBB and 1st degree AV block.   Recent Labs: 04/01/2018: ALT 19 04/06/2018: Platelets 336 06/12/2018: BUN 15; Creatinine, Ser 1.30; Hemoglobin 11.9; Potassium 4.1; Sodium 137  Recent Lipid Panel    Component Value Date/Time   CHOL 143 04/02/2018  0259   TRIG 29 04/02/2018 0259   HDL 57 04/02/2018 0259   CHOLHDL 2.5 04/02/2018 0259   VLDL 6 04/02/2018 0259   LDLCALC 80 04/02/2018 0259   LDLCALC 78 03/13/2018 0000    Physical Exam:    VS:  BP 115/62   Pulse 83   Ht 5' 8.5" (1.74 m)   Wt 158 lb (71.7 kg)   BMI 23.67 kg/m     Wt Readings from Last 3 Encounters:  08/15/18 158 lb (71.7 kg)  08/08/18 157 lb (71.2 kg)  08/07/18 157 lb 3.2 oz (71.3 kg)     GEN: Well nourished, well developed in no acute distress HEENT: Normal NECK: No JVD; No carotid bruits LYMPHATICS: No lymphadenopathy CARDIAC: regular rhythm, normal S1 and S2, no rubs, gallops. 3/6 early to mid peaking SEM with preserved S2. Radial pulses 2+ bilaterally. No palpable distal LE pulses. RESPIRATORY:  Clear to auscultation without rales, wheezing or rhonchi  ABDOMEN: Soft, non-tender, non-distended MUSCULOSKELETAL:  Bilateral firm edema with evidence of healed blisters, no active oozing. Extremities are lukewarm, no palpable pulses. Foot wound dressed. SKIN: Warm and dry.  NEUROLOGIC:  Alert, hoarse voice, moves all limbs independently. PSYCHIATRIC:  Normal affect   ASSESSMENT:    1. Bilateral lower extremity edema   2. Paroxysmal atrial fibrillation (HCC)   3. Medication management   4. History of stroke with residual deficit   5. PAD (peripheral artery disease) (HCC)    PLAN:    Bilateral LE edema: given no JVD and clear lungs, this is likely 2/2 his PAD that cannot be revascularized. Firm, with reports of intermittent blistering. He cannot elevate his legs for long due to his PAD. Given how firm and painful the legs are, we will try a brief course of lasix, but I do not want to injure his kidneys. -instructed to take 40 mg oral lasix daily for three days. This may not completely eliminate the swelling, but it should help the pain. He can then take PRN for swelling, but ideally not daily. I have ordered a renal panel for next week to follow his kidney  function. -he cannot tolerate elevation. Could try mild compression stockings, but with PAD do not want to  obstruct blood flow with tight compression  Paroxysmal atrial fibrillation, diagnosed at the time of CVA:  -CHA2DS2/VAS Stroke Risk Points  = 7   -continue apixaban for anticoagulation. Weight and renal function support 5 mg BID dosing. -tolerating metoprolol for rate control of afib (in SR today)  With CVA:  -on statin for secondary prevention, LDL goal <70. Last LDL 80, had simvastatin dose increased since then. Has future lipid panel ordered -hypertension goal <130/80, at goal today -diabetes control, per his primary team   PAD: attempted revascularization for critical limb ischemia but unable to open distal vessels.   Plan for follow up: 3 mos  TIME SPENT WITH PATIENT: 30 minutes of direct patient care. More than 50% of that time was spent on coordination of care and counseling regarding lower extremity edema and management.  Buford Dresser, MD, PhD [place]  [facility]   Medication Adjustments/Labs and Tests Ordered: Current medicines are reviewed at length with the patient today.  Concerns regarding medicines are outlined above.  Orders Placed This Encounter  Procedures  . Basic metabolic panel  . EKG 12-Lead   Meds ordered this encounter  Medications  . furosemide (LASIX) 40 MG tablet    Sig: Take 1 tablet (40 mg total) by mouth daily as needed. Take for leg swelling. Take only for three days in a row.    Dispense:  30 tablet    Refill:  11    Patient Instructions  Medication Instructions:  Start: Lasix 40 mg as needed  If you need a refill on your cardiac medications before your next appointment, please call your pharmacy.   Lab work: Your physician recommends that have the facility draw labs. (BMP)  If you have labs (blood work) drawn today and your tests are completely normal, you will receive your results only by: Marland Kitchen MyChart Message (if  you have MyChart) OR . A paper copy in the mail If you have any lab test that is abnormal or we need to change your treatment, we will call you to review the results.  Testing/Procedures: None  Follow-Up: At [facility], you and your health needs are our priority.  As part of our continuing mission to provide you with exceptional heart care, we have created designated Provider Care Teams.  These Care Teams include your primary Cardiologist (physician) and Advanced Practice Providers (APPs -  Physician Assistants and Nurse Practitioners) who all work together to provide you with the care you need, when you need it. You will need a follow up appointment in 3 months.  Please call our office 2 months in advance to schedule this appointment.  You may see Buford Dresser, MD or one of the following Advanced Practice Providers on your designated Care Team:   Rosaria Ferries, PA-C . Jory Sims, DNP, ANP        Signed, Buford Dresser, MD PhD 08/16/2018 9:24 AM    [place]

## 2018-08-15 NOTE — Patient Instructions (Signed)
Medication Instructions:  Start: Lasix 40 mg as needed  If you need a refill on your cardiac medications before your next appointment, please call your pharmacy.   Lab work: Your physician recommends that have the facility draw labs. (BMP)  If you have labs (blood work) drawn today and your tests are completely normal, you will receive your results only by: Marland Kitchen MyChart Message (if you have MyChart) OR . A paper copy in the mail If you have any lab test that is abnormal or we need to change your treatment, we will call you to review the results.  Testing/Procedures: None  Follow-Up: At [facility], you and your health needs are our priority.  As part of our continuing mission to provide you with exceptional heart care, we have created designated Provider Care Teams.  These Care Teams include your primary Cardiologist (physician) and Advanced Practice Providers (APPs -  Physician Assistants and Nurse Practitioners) who all work together to provide you with the care you need, when you need it. You will need a follow up appointment in 3 months.  Please call our office 2 months in advance to schedule this appointment.  You may see Buford Dresser, MD or one of the following Advanced Practice Providers on your designated Care Team:   Rosaria Ferries, PA-C . Jory Sims, DNP, ANP

## 2018-08-16 ENCOUNTER — Encounter: Payer: Self-pay | Admitting: Cardiology

## 2018-08-16 DIAGNOSIS — I6930 Unspecified sequelae of cerebral infarction: Secondary | ICD-10-CM | POA: Insufficient documentation

## 2018-08-21 ENCOUNTER — Telehealth: Payer: Self-pay | Admitting: Cardiology

## 2018-08-21 NOTE — Telephone Encounter (Signed)
New message   Pt c/o medication issue:  1. Name of Medication: furosemide (LASIX) 40 MG tablet  2. How are you currently taking this medication (dosage and times per day)? 1 time daily for 3 days  3. Are you having a reaction (difficulty breathing--STAT)?no  4. What is your medication issue? Patient reports that the swelling has gone down in your legs. Patient wants to know if there is any follow up instructions. Please call to discuss.

## 2018-08-21 NOTE — Telephone Encounter (Signed)
Spoke with Nathan Cook and swelling has improved Informed Nathan Cook may take Furosemide 40 mg as needed for leg swelling and if needs to take in future to try not take more than 3 days in a row .Nathan Cook verbalizes understanding /cy

## 2018-08-28 ENCOUNTER — Other Ambulatory Visit: Payer: Self-pay

## 2018-08-28 ENCOUNTER — Non-Acute Institutional Stay: Payer: Medicare Other | Admitting: Internal Medicine

## 2018-08-28 ENCOUNTER — Encounter: Payer: Self-pay | Admitting: Internal Medicine

## 2018-08-28 VITALS — BP 124/60 | HR 70 | Temp 98.1°F | Ht 69.0 in | Wt 152.4 lb

## 2018-08-28 DIAGNOSIS — L03116 Cellulitis of left lower limb: Secondary | ICD-10-CM

## 2018-08-28 DIAGNOSIS — S91109S Unspecified open wound of unspecified toe(s) without damage to nail, sequela: Secondary | ICD-10-CM

## 2018-08-28 DIAGNOSIS — I480 Paroxysmal atrial fibrillation: Secondary | ICD-10-CM | POA: Diagnosis not present

## 2018-08-28 DIAGNOSIS — I739 Peripheral vascular disease, unspecified: Secondary | ICD-10-CM

## 2018-08-28 MED ORDER — DOXYCYCLINE HYCLATE 100 MG PO TABS: 100.0000 mg | ORAL_TABLET | Freq: Two times a day (BID) | ORAL | 0 refills | Status: AC

## 2018-08-28 NOTE — Progress Notes (Signed)
Location: [place] of Service:  Clinic (12)  Provider:   Code Status:  Goals of Care:  Advanced Directives 08/28/2018  Does Patient Have a Medical Advance Directive? Yes  Type of Paramedic of [place];Living will  Does patient want to make changes to medical advance directive? No - Patient declined  Copy of [place] in Chart? Yes - validated most recent copy scanned in chart (See row information)     Chief Complaint  Patient presents with  . Medical Management of Chronic Issues    3 week follow up     HPI: Patient is a 85 y.o. male seen today for an acute visit for Follow up of his Wound on his Foot. Patient has h/o Hypertension, Hyperlipidemia, Diabetes mellitus, h/o Brain stem Infarct s/p TPA  In 10/19, New Onset Atrial Fibrillation on Eliquis since 10/19.,PAD   Patient has non healing Ulcer on his Left Great Toe. He was  Seen by Vascular.Per their notes Patient has failed revascularization . And at this time he is not interested in Amputation. Plan is for Palliative Wound Care.  I was following patient and had started him on Santyl for the toe wound. But he came today with his Daughter . And he has macerated skin around his other toes. And he had redness in and around the feet. He says he has been soaking his feet in Epsom salt for 20 min to clean it.He is now draining from his feet. It is not tender and  has no fever. His toe wound seems to be stable but continue to have eschar on the base. He has also started on Lasix by Cardiology to control his swelling. Which he is taking every other day. His swelling is down.   Past Medical History:  Diagnosis Date  . BPH (benign prostatic hyperplasia) 10/14/2014  . Cervicalgia 01/04/2016  . Chronic kidney disease   . Contusion of left shoulder 05/27/14  . DM type 2 (diabetes mellitus, type 2) ([place])   . Dysphagia   . High blood pressure   . History of basal cell cancer  11/21/2005   Right temple  . History of colonic polyps 10/14/2003  . Hyperlipidemia   . Left knee pain 05/27/14  . Peripheral vascular disease ([place])   . SAH (subarachnoid hemorrhage) ([place]) 2009   TBI  . Stroke ([place])   . TIA (transient ischemic attack) 11/06/2005   Left eye pain. Slurred speech. Diaphoresis. No residual effects.     Past Surgical History:  Procedure Laterality Date  . ABDOMINAL AORTOGRAM W/LOWER EXTREMITY N/A 06/12/2018   Procedure: ABDOMINAL AORTOGRAM W/LOWER EXTREMITY;  Surgeon: Marty Heck, MD;  Location: [place] [facility];  Service: Cardiovascular;  Laterality: N/A;  . Achilles tendon repair Left 1998   ruptured tendon  . COLONOSCOPY  1992   Polyp removed  . COLONOSCOPY  1998   normal  . ORIF FEMUR FRACTURE Right 1929  . PERIPHERAL VASCULAR INTERVENTION Left 06/12/2018   Procedure: PERIPHERAL VASCULAR INTERVENTION;  Surgeon: Marty Heck, MD;  Location: [place] [facility];  Service: Cardiovascular;  Laterality: Left;  Attempted   . TONSILLECTOMY      No Known Allergies  Outpatient Encounter Medications as of 08/28/2018  Medication Sig  . acetaminophen (TYLENOL 8 HOUR) 650 MG CR tablet Take 1 tablet (650 mg total) by mouth every 8 (eight) hours as needed for pain.  Marland Kitchen apixaban (ELIQUIS) 5 MG TABS tablet Take  1 tablet (5 mg total) by mouth 2 (two) times daily.  . Calcium Carb-Cholecalciferol (CALCIUM 600+D3 PO) Take 1 tablet by mouth daily.  . Cholecalciferol 1000 UNITS tablet Take 1,000 Units by mouth daily.  . collagenase (SANTYL) ointment Apply 1 application topically daily.  . furosemide (LASIX) 40 MG tablet Take 1 tablet (40 mg total) by mouth daily as needed. Take for leg swelling. Take only for three days in a row.  . gabapentin (NEURONTIN) 100 MG capsule Take 1 capsule (100 mg total) by mouth at bedtime.  Marland Kitchen losartan (COZAAR) 25 MG tablet Take 1 tablet (25 mg total) by mouth 2 (two) times daily.  . metFORMIN (GLUCOPHAGE) 500 MG tablet Take  1 tablet (500 mg total) by mouth 2 (two) times daily with a meal.  . metoprolol tartrate (LOPRESSOR) 25 MG tablet Take 1 tablet (25 mg total) by mouth 2 (two) times daily.  Marland Kitchen Propylene Glycol-Glycerin 1-0.3 % SOLN Place 2 drops into both eyes as needed (dry eyes).   . simvastatin (ZOCOR) 10 MG tablet Take 1 tablet (10 mg total) by mouth daily.  Marland Kitchen triamcinolone lotion (KENALOG) 0.1 % APPLY TO AFFECTED AREA(S) THREE TIMES A DAY  . doxycycline (VIBRA-TABS) 100 MG tablet Take 1 tablet (100 mg total) by mouth 2 (two) times daily for 7 days.   No facility-administered encounter medications on file as of 08/28/2018.     Review of Systems:  Review of Systems  Constitutional: Negative.   HENT: Negative.   Respiratory: Negative.   Cardiovascular: Negative.   Gastrointestinal: Negative.   Genitourinary: Negative.   Musculoskeletal: Negative.   Skin: Positive for wound.  Neurological: Negative.   Psychiatric/Behavioral: Negative.   All other systems reviewed and are negative.   Health Maintenance  Topic Date Due  . OPHTHALMOLOGY EXAM  07/07/2017  . FOOT EXAM  06/27/2018  . HEMOGLOBIN A1C  10/02/2018  . TETANUS/TDAP  04/29/2027  . INFLUENZA VACCINE  Completed  . PNA vac Low Risk Adult  Completed    Physical Exam: Vitals:   08/28/18 1338  BP: 124/60  Pulse: 70  Temp: 98.1 F (36.7 C)  SpO2: 98%  Weight: 152 lb 6.4 oz (69.1 kg)  Height: '5\' 9"'$  (1.753 m)   Body mass index is 22.51 kg/m. Physical Exam Vitals signs reviewed.  Constitutional:      Appearance: Normal appearance.  HENT:     Head: Normocephalic.     Nose: Nose normal.     Mouth/Throat:     Mouth: Mucous membranes are moist.     Pharynx: Oropharynx is clear.  Neck:     Musculoskeletal: Neck supple.  Cardiovascular:     Rate and Rhythm: Normal rate and regular rhythm.     Heart sounds: Murmur present.  Pulmonary:     Effort: Pulmonary effort is normal. No respiratory distress.     Breath sounds: Normal breath  sounds. No wheezing or rales.  Abdominal:     General: Abdomen is flat. There is no distension.     Palpations: Abdomen is soft.     Tenderness: There is no abdominal tenderness. There is no guarding.  Musculoskeletal:     Comments: Chronic Venous Changes Bilateral  Skin:    Comments: Left Foot Exam Was warm had feeble Pulses Redness around the medial side of foot which seems to be new and had seronous discharge from his foot. Also has macerated skin on top of the other toes  Neurological:     Mental Status: He  is alert and oriented to person, place, and time.     Comments: Walks with the walker with no recent Falls Has mild weakness in Right UE  Psychiatric:        Mood and Affect: Mood normal.        Behavior: Behavior normal.        Thought Content: Thought content normal.     Labs reviewed: Basic Metabolic Panel: Recent Labs    04/03/18 0304 04/04/18 0426 04/06/18 0348 06/12/18 0904  NA 136 138 141 137  K 3.7 3.8 3.7 4.1  CL 105 105 110 102  CO2 22 18* 24  --   GLUCOSE 148* 156* 141* 142*  BUN 28* 28* 24* 15  CREATININE 1.36* 1.30* 1.10 1.30*  CALCIUM 8.6* 8.4* 8.4*  --    Liver Function Tests: Recent Labs    09/10/17 0750 03/13/18 0000 04/01/18 1432  AST '18 24 25  '$ ALT '21 26 19  '$ ALKPHOS  --   --  57  BILITOT 0.7 0.9 0.9  PROT 6.5 6.6 6.6  ALBUMIN  --   --  3.7   No results for input(s): LIPASE, AMYLASE in the last 8760 hours. No results for input(s): AMMONIA in the last 8760 hours. CBC: Recent Labs    04/01/18 1432  04/03/18 0304 04/04/18 0426 04/06/18 0348 06/12/18 0904  WBC 8.9   < > 24.0* 15.2* 12.4*  --   NEUTROABS 6.3  --   --   --   --   --   HGB 12.7*   < > 12.0* 13.3 13.5 11.9*  HCT 38.4*   < > 36.2* 40.0 40.5 35.0*  MCV 104.9*   < > 100.3* 101.8* 102.0*  --   PLT 317   < > 365 338 336  --    < > = values in this interval not displayed.   Lipid Panel: Recent Labs    09/10/17 0750 03/13/18 0000 04/02/18 0259  CHOL 153 154 143    HDL 51 57 57  LDLCALC 83 78 80  TRIG 91 104 29  CHOLHDL 3.0 2.7 2.5   Lab Results  Component Value Date   HGBA1C 7.6 (H) 04/02/2018    Procedures since last visit: No results found.  Assessment/Plan 1. Non-healing open wound of toe, sequela Mad urgent reference to Wound care clinic - Ambulatory referral to Pain Clinic - CBC with Differential/Platelet; Future - CMP with eGFR([facility]); Future Started on Doxycyline for 7 days Discontinue Soaking the feet discontinue Just Dry dressing.  I d/w the daughter that his foot looks really bad. Also offered them  to go to ED but they want to stay with this  plan for now. Will also d/w the facility Nurse to see if we can get him to more care place for wound care. Prognosis is very poor  Other Stable Problems PAD (peripheral artery disease)  Conservative Management right now per Vascular On Statin and Eliquis Blood sugar Controlled  Controlled type 2 diabetes mellitus  On Metformin Last A1C wwas 7.6 Repeat A1C Neuropathy Patient willing to try Neurontin Paroxysmal atrial fibrillation  Tolerating Eliquis On Metoprolol  Cerebrovascular accident (CVA) Recovered well  Continue on Statin BP controlled On Eliquis  CKD (chronic kidney disease) stage 3,  Creat stable Repeat BMP  Hyperlipidemia LDL goal <70 On Zocor Repeat Lipid  Hypertension On Losartan and Metoprolol    Labs/tests ordered:  '@ORDERS'$ @ Next appt:  08/29/2018

## 2018-08-29 ENCOUNTER — Other Ambulatory Visit: Payer: Medicare Other

## 2018-08-29 DIAGNOSIS — E785 Hyperlipidemia, unspecified: Secondary | ICD-10-CM

## 2018-08-29 DIAGNOSIS — E1122 Type 2 diabetes mellitus with diabetic chronic kidney disease: Secondary | ICD-10-CM

## 2018-08-29 DIAGNOSIS — I739 Peripheral vascular disease, unspecified: Secondary | ICD-10-CM

## 2018-08-29 DIAGNOSIS — N183 Chronic kidney disease, stage 3 unspecified: Secondary | ICD-10-CM

## 2018-08-30 ENCOUNTER — Other Ambulatory Visit: Payer: Self-pay | Admitting: Internal Medicine

## 2018-08-30 DIAGNOSIS — S91109S Unspecified open wound of unspecified toe(s) without damage to nail, sequela: Secondary | ICD-10-CM

## 2018-08-30 LAB — CBC WITH DIFFERENTIAL/PLATELET
Absolute Monocytes: 1287 cells/uL — ABNORMAL HIGH (ref 200–950)
Basophils Absolute: 59 cells/uL (ref 0–200)
Basophils Relative: 0.5 %
Eosinophils Absolute: 398 cells/uL (ref 15–500)
Eosinophils Relative: 3.4 %
HCT: 34.5 % — ABNORMAL LOW (ref 38.5–50.0)
Hemoglobin: 12 g/dL — ABNORMAL LOW (ref 13.2–17.1)
LYMPHS ABS: 1486 {cells}/uL (ref 850–3900)
MCH: 34.7 pg — ABNORMAL HIGH (ref 27.0–33.0)
MCHC: 34.8 g/dL (ref 32.0–36.0)
MCV: 99.7 fL (ref 80.0–100.0)
MPV: 9.9 fL (ref 7.5–12.5)
Monocytes Relative: 11 %
Neutro Abs: 8471 cells/uL — ABNORMAL HIGH (ref 1500–7800)
Neutrophils Relative %: 72.4 %
Platelets: 455 10*3/uL — ABNORMAL HIGH (ref 140–400)
RBC: 3.46 10*6/uL — ABNORMAL LOW (ref 4.20–5.80)
RDW: 11.4 % (ref 11.0–15.0)
Total Lymphocyte: 12.7 %
WBC: 11.7 10*3/uL — ABNORMAL HIGH (ref 3.8–10.8)

## 2018-08-30 LAB — COMPLETE METABOLIC PANEL WITH GFR
AG Ratio: 1.4 (calc) (ref 1.0–2.5)
ALBUMIN MSPROF: 3.5 g/dL — AB (ref 3.6–5.1)
ALT: 13 U/L (ref 9–46)
AST: 15 U/L (ref 10–35)
Alkaline phosphatase (APISO): 76 U/L (ref 35–144)
BUN/Creatinine Ratio: 15 (calc) (ref 6–22)
BUN: 22 mg/dL (ref 7–25)
CO2: 25 mmol/L (ref 20–32)
CREATININE: 1.44 mg/dL — AB (ref 0.70–1.11)
Calcium: 9.2 mg/dL (ref 8.6–10.3)
Chloride: 99 mmol/L (ref 98–110)
GFR, EST NON AFRICAN AMERICAN: 41 mL/min/{1.73_m2} — AB (ref >=60)
GFR, Est African American: 48 mL/min/{1.73_m2} — ABNORMAL LOW (ref >=60)
Globulin: 2.5 g/dL (calc) (ref 1.9–3.7)
Glucose, Bld: 173 mg/dL — ABNORMAL HIGH (ref 65–99)
Potassium: 4.4 mmol/L (ref 3.5–5.3)
Sodium: 135 mmol/L (ref 135–146)
Total Bilirubin: 0.7 mg/dL (ref 0.2–1.2)
Total Protein: 6 g/dL — ABNORMAL LOW (ref 6.1–8.1)

## 2018-08-30 LAB — HEMOGLOBIN A1C
Hgb A1c MFr Bld: 8 % of total Hgb — ABNORMAL HIGH (ref <5.7)
Mean Plasma Glucose: 183 (calc)
eAG (mmol/L): 10.1 (calc)

## 2018-08-30 LAB — LIPID PANEL
Cholesterol: 126 mg/dL (ref <200)
HDL: 49 mg/dL (ref >=40)
LDL Cholesterol (Calc): 59 mg/dL (calc)
Non-HDL Cholesterol (Calc): 77 mg/dL (calc) (ref <130)
Total CHOL/HDL Ratio: 2.6 (calc) (ref <5.0)
Triglycerides: 95 mg/dL (ref <150)

## 2018-09-02 ENCOUNTER — Telehealth: Payer: Self-pay

## 2018-09-02 NOTE — Telephone Encounter (Addendum)
Call was placed to patient to let him know [facility] could not go out to see him  because Medicare would not cover dry dressing wound. Lattie Haw the referral coordinator then called another wound place [facility] and they will not be able to see him until 3/20 @2PM  with Dr. Evette Doffing [phone] [phone].Lattie Haw did go ahead to schedule that appointment. [facility] recommended [name] go to the ED to be evaluated immediately since they are not able to see him until the 20th of March.  I called [name] and provided him with this information and he states his foot is still leaking fluid and he is not able to get his shoe on. [name] verbalized that he understood everything I explained to him he wrote down everything including the address and phone number for the referral

## 2018-09-03 ENCOUNTER — Emergency Department (HOSPITAL_COMMUNITY)
Admission: EM | Admit: 2018-09-03 | Discharge: 2018-09-03 | Disposition: A | Discharge status: Home / Self Care | Payer: Medicare Other | Attending: Emergency Medicine | Admitting: Emergency Medicine

## 2018-09-03 ENCOUNTER — Encounter (HOSPITAL_COMMUNITY): Payer: Self-pay

## 2018-09-03 ENCOUNTER — Other Ambulatory Visit: Payer: Self-pay

## 2018-09-03 DIAGNOSIS — Y9389 Activity, other specified: Secondary | ICD-10-CM | POA: Insufficient documentation

## 2018-09-03 DIAGNOSIS — X58XXXA Exposure to other specified factors, initial encounter: Secondary | ICD-10-CM | POA: Insufficient documentation

## 2018-09-03 DIAGNOSIS — Z87891 Personal history of nicotine dependence: Secondary | ICD-10-CM | POA: Diagnosis not present

## 2018-09-03 DIAGNOSIS — N183 Chronic kidney disease, stage 3 (moderate): Secondary | ICD-10-CM | POA: Insufficient documentation

## 2018-09-03 DIAGNOSIS — Y999 Unspecified external cause status: Secondary | ICD-10-CM | POA: Diagnosis not present

## 2018-09-03 DIAGNOSIS — Z4800 Encounter for change or removal of nonsurgical wound dressing: Secondary | ICD-10-CM | POA: Insufficient documentation

## 2018-09-03 DIAGNOSIS — Y929 Unspecified place or not applicable: Secondary | ICD-10-CM | POA: Diagnosis not present

## 2018-09-03 DIAGNOSIS — S91302A Unspecified open wound, left foot, initial encounter: Secondary | ICD-10-CM | POA: Diagnosis not present

## 2018-09-03 DIAGNOSIS — Z5189 Encounter for other specified aftercare: Secondary | ICD-10-CM | POA: Insufficient documentation

## 2018-09-03 DIAGNOSIS — I998 Other disorder of circulatory system: Secondary | ICD-10-CM | POA: Diagnosis not present

## 2018-09-03 DIAGNOSIS — Z8673 Personal history of transient ischemic attack (TIA), and cerebral infarction without residual deficits: Secondary | ICD-10-CM | POA: Insufficient documentation

## 2018-09-03 DIAGNOSIS — E1122 Type 2 diabetes mellitus with diabetic chronic kidney disease: Secondary | ICD-10-CM | POA: Insufficient documentation

## 2018-09-03 NOTE — Care Management (Signed)
ED CM at [facility] called the patient's room in the ED at [facility]. ED CM spoke with the patient's daughter who stated she would like any home health agency that accepts the patient's insurance. Order for home health sent to [facility]. Fax confirmation received. Patient uses a walker at home and is able to ambulate per his daughter. She is able to assist the patient as needed. She takes him to his appointments. Venita Sheffield RN CCM

## 2018-09-03 NOTE — ED Provider Notes (Signed)
Emergency Department Provider Note   I have reviewed the triage vital signs and the nursing notes.   HISTORY  Chief Complaint foot wound   HPI Nathan Cook is a 85 y.o. male multi-medical problems documented below to include arterial insufficiency in bilateral lower extremities the presents the emergency department today with nonhealing wounds of his left foot for multiple months.  Patient has been seen by vascular is not a candidate for any intervention and they state that he needs to go to wound care and if that does not work he may need an amputation.  He has a wound care appointment scheduled for next Friday but they wanted him to be seen and debrided prior to that.  Patient no fevers no significant worsening recently.  He does have some erythema there but he states that that comes and goes often and is not any worse than normal.  No streaking redness or fevers.  No systemic symptoms. No other associated or modifying symptoms.    Past Medical History:  Diagnosis Date  . BPH (benign prostatic hyperplasia) 10/14/2014  . Cervicalgia 01/04/2016  . Chronic kidney disease   . Contusion of left shoulder 05/27/14  . DM type 2 (diabetes mellitus, type 2) ([place])   . Dysphagia   . High blood pressure   . History of basal cell cancer 11/21/2005   Right temple  . History of colonic polyps 10/14/2003  . Hyperlipidemia   . Left knee pain 05/27/14  . Peripheral vascular disease ([place])   . SAH (subarachnoid hemorrhage) ([place]) 2009   TBI  . Stroke ([place])   . TIA (transient ischemic attack) 11/06/2005   Left eye pain. Slurred speech. Diaphoresis. No residual effects.     Patient Active Problem List   Diagnosis Date Noted  . History of stroke with residual deficit 08/16/2018  . Non-healing open wound of toe 07/24/2018  . Stasis dermatitis of both legs 06/10/2018  . Dysphagia 04/12/2018  . Paroxysmal atrial fibrillation (HCC)   . Stroke ([place]) 04/01/2018  . PAD (peripheral artery disease)  ([place]) 03/20/2018  . Generalized osteoarthritis 06/27/2017  . B12 deficiency 06/27/2017  . History of TIA (transient ischemic attack) 03/21/2017  . Vitamin D deficiency 03/21/2017  . Dry eyes, bilateral 03/21/2017  . CKD (chronic kidney disease) stage 3, GFR 30-59 ml/min (HCC) 03/21/2017  . Cervicalgia 01/04/2016  . Pain in joint, shoulder region 05/11/2015  . BPH (benign prostatic hyperplasia) 10/14/2014  . Hyperlipidemia LDL goal <70   . Benign hypertension with chronic kidney disease, stage III ([place])   . Controlled type 2 diabetes mellitus with stage 3 chronic kidney disease, without long-term current use of insulin ([place])   . Left knee pain 05/27/2014  . 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 History of colonic polyps 10/14/2003    Past Surgical History:  Procedure Laterality Date  . ABDOMINAL AORTOGRAM W/LOWER EXTREMITY N/A 06/12/2018   Procedure: ABDOMINAL AORTOGRAM W/LOWER EXTREMITY;  Surgeon: Marty Heck, MD;  Location: [facility];  Service: Cardiovascular;  Laterality: N/A;  . Achilles tendon repair Left 1998   ruptured tendon  . COLONOSCOPY  1992   Polyp removed  . COLONOSCOPY  1998   normal  . ORIF FEMUR FRACTURE Right 1929  . PERIPHERAL VASCULAR INTERVENTION Left 06/12/2018   Procedure: PERIPHERAL VASCULAR INTERVENTION;  Surgeon: Marty Heck, MD;  Location: [facility];  Service: Cardiovascular;  Laterality: Left;  Attempted   . TONSILLECTOMY  Current Outpatient Rx  . Order #: [id number] Class: Normal  . Order #: [id number] Class: Historical Med  . Order #: [id number] Class: Historical Med  . Order #: [id number] Class: Normal  . Order #: [id number] Class: Normal  . Order #: [id number] Class: Normal  . Order #: [id number] Class: Normal  . Order #: [id number] Class: Normal  . Order #: [id number] Class: Normal  . Order #: [id number] Class: Normal  . Order #: [id number] Class: OTC  . Order #: [id number] Class: Historical Med     Allergies Patient has no known allergies.  Family History  Problem Relation Age of Onset  . Heart attack Mother   . Heart disease Mother   . Heart disease Father     Social History Social History   Tobacco Use  . Smoking status: Former Smoker    Last attempt to quit: 06/26/1971    Years since quitting: 47.2  . Smokeless tobacco: Never Used  . Tobacco comment: 2 1/2 packs a day, until 1972  Substance Use Topics  . Alcohol use: Yes    Alcohol/week: 1.0 - 2.0 standard drinks    Types: 1 - 2 Glasses of wine per week    Comment: Wine nightly  . Drug use: No    Review of Systems  All other systems negative except as documented in the HPI. All pertinent positives and negatives as reviewed in the HPI. ____________________________________________   PHYSICAL EXAM:  VITAL SIGNS: ED Triage Vitals  Enc Vitals Group     BP 09/03/18 1408 120/80     Pulse Rate 09/03/18 1408 70     Resp 09/03/18 1408 (!) 22     Temp 09/03/18 1408 (!) 97.5 F (36.4 C)     Temp Source 09/03/18 1408 Oral     SpO2 09/03/18 1408 98 %     Weight 09/03/18 1408 145 lb (65.8 kg)     Height 09/03/18 1408 5\' 8"  (1.727 m)     Head Circumference --      Peak Flow --      Pain Score 09/03/18 1409 7     Pain Loc --      Pain Edu? --      Excl. in [place]? --     Constitutional: Alert and oriented. Well appearing and in no acute distress. Eyes: Conjunctivae are normal. PERRL. EOMI. Head: Atraumatic. Nose: No congestion/rhinnorhea. Mouth/Throat: Mucous membranes are moist.  Oropharynx non-erythematous. Neck: No stridor.  No meningeal signs.   Cardiovascular: Normal rate, regular rhythm. Good peripheral circulation. Grossly normal heart sounds.   Respiratory: Normal respiratory effort.  No retractions. Lungs CTAB. Gastrointestinal: Soft and nontender. No distention.  Musculoskeletal: No lower extremity tenderness nor edema. No gross deformities of extremities. Neurologic:  Normal speech and language.  No gross focal neurologic deficits are appreciated.  Skin:  Right foot: denuded skin on all toes, erythema extending above ankle. Warm but not hot, delayed cap refill. Not able to palpate pulse.  Left foot, edema to all toes, one small ulcer to dorsum of big toe.    ____________________________________________   INITIAL IMPRESSION / ASSESSMENT AND PLAN / ED COURSE  The red area does not seem to be warm and patient states this is normal for her to come and go.  Will not start antibiotics at this time.  We will try to get a wound care nurse involved for recommendations and treatment.  If not then the patient will need to try to get treatment quicker at the wound care center  No wound care consult available at  this time.  Discussed with case management who will try to get home health wound care.  In the meantime we will gently debrided here in the emergency room apply Aquacel dressing in anticipation of wound care consult at home in the next day or 2.   Pertinent labs & imaging results that were available during my care of the patient were reviewed by me and considered in my medical decision making (see chart for details).  ____________________________________________  FINAL CLINICAL IMPRESSION(S) / ED DIAGNOSES  Final diagnoses:  Vascular insufficiency  Encounter for wound care     MEDICATIONS GIVEN DURING THIS VISIT:  Medications - No data to display   NEW OUTPATIENT MEDICATIONS STARTED DURING THIS VISIT:  New Prescriptions   No medications on file    Note:  This note was prepared with assistance of Dragon voice recognition software. Occasional wrong-word or sound-a-like substitutions may have occurred due to the inherent limitations of voice recognition software.   Merrily Pew, MD 09/03/18 1740

## 2018-09-03 NOTE — ED Notes (Signed)
Signature in room not working.  Pt agreed to verbal consent to D/C for signature

## 2018-09-03 NOTE — ED Notes (Signed)
Dressed pt's wound on pt's L foot/toes with pt's own wound dressing, used gauze to wrap foot.  Used same dressing on wound on the back of pt's L lower leg.

## 2018-09-03 NOTE — ED Triage Notes (Signed)
Patient has an open wound and weeping on the left foot. Patient is scheduled for the [place] next week. PCP told the patient needed to be seen today.

## 2018-09-06 ENCOUNTER — Encounter (HOSPITAL_BASED_OUTPATIENT_CLINIC_OR_DEPARTMENT_OTHER): Payer: Medicare Other | Attending: Internal Medicine

## 2018-09-06 DIAGNOSIS — Z87891 Personal history of nicotine dependence: Secondary | ICD-10-CM | POA: Insufficient documentation

## 2018-09-06 DIAGNOSIS — I129 Hypertensive chronic kidney disease with stage 1 through stage 4 chronic kidney disease, or unspecified chronic kidney disease: Secondary | ICD-10-CM | POA: Diagnosis not present

## 2018-09-06 DIAGNOSIS — L97322 Non-pressure chronic ulcer of left ankle with fat layer exposed: Secondary | ICD-10-CM | POA: Diagnosis not present

## 2018-09-06 DIAGNOSIS — L97522 Non-pressure chronic ulcer of other part of left foot with fat layer exposed: Secondary | ICD-10-CM | POA: Diagnosis not present

## 2018-09-06 DIAGNOSIS — Z8673 Personal history of transient ischemic attack (TIA), and cerebral infarction without residual deficits: Secondary | ICD-10-CM | POA: Insufficient documentation

## 2018-09-06 DIAGNOSIS — E11621 Type 2 diabetes mellitus with foot ulcer: Secondary | ICD-10-CM | POA: Diagnosis not present

## 2018-09-06 DIAGNOSIS — L97422 Non-pressure chronic ulcer of left heel and midfoot with fat layer exposed: Secondary | ICD-10-CM | POA: Insufficient documentation

## 2018-09-06 DIAGNOSIS — I480 Paroxysmal atrial fibrillation: Secondary | ICD-10-CM | POA: Insufficient documentation

## 2018-09-06 DIAGNOSIS — E1122 Type 2 diabetes mellitus with diabetic chronic kidney disease: Secondary | ICD-10-CM | POA: Insufficient documentation

## 2018-09-06 DIAGNOSIS — E1142 Type 2 diabetes mellitus with diabetic polyneuropathy: Secondary | ICD-10-CM | POA: Diagnosis not present

## 2018-09-06 DIAGNOSIS — Z7984 Long term (current) use of oral hypoglycemic drugs: Secondary | ICD-10-CM | POA: Diagnosis not present

## 2018-09-06 DIAGNOSIS — Z85828 Personal history of other malignant neoplasm of skin: Secondary | ICD-10-CM | POA: Diagnosis not present

## 2018-09-06 DIAGNOSIS — E1152 Type 2 diabetes mellitus with diabetic peripheral angiopathy with gangrene: Secondary | ICD-10-CM | POA: Insufficient documentation

## 2018-09-06 DIAGNOSIS — Z7901 Long term (current) use of anticoagulants: Secondary | ICD-10-CM | POA: Insufficient documentation

## 2018-09-06 DIAGNOSIS — L97512 Non-pressure chronic ulcer of other part of right foot with fat layer exposed: Secondary | ICD-10-CM | POA: Diagnosis not present

## 2018-09-06 DIAGNOSIS — N183 Chronic kidney disease, stage 3 (moderate): Secondary | ICD-10-CM | POA: Insufficient documentation

## 2018-09-09 DIAGNOSIS — E11621 Type 2 diabetes mellitus with foot ulcer: Secondary | ICD-10-CM | POA: Diagnosis not present

## 2018-09-11 ENCOUNTER — Encounter: Payer: Self-pay | Admitting: Internal Medicine

## 2018-09-11 ENCOUNTER — Other Ambulatory Visit: Payer: Self-pay

## 2018-09-11 ENCOUNTER — Non-Acute Institutional Stay: Payer: Medicare Other | Admitting: Internal Medicine

## 2018-09-11 VITALS — BP 118/58 | HR 69 | Temp 97.6°F | Ht 69.0 in | Wt 149.8 lb

## 2018-09-11 DIAGNOSIS — I739 Peripheral vascular disease, unspecified: Secondary | ICD-10-CM | POA: Diagnosis not present

## 2018-09-11 DIAGNOSIS — E785 Hyperlipidemia, unspecified: Secondary | ICD-10-CM | POA: Diagnosis not present

## 2018-09-11 DIAGNOSIS — I6930 Unspecified sequelae of cerebral infarction: Secondary | ICD-10-CM | POA: Diagnosis not present

## 2018-09-11 DIAGNOSIS — N183 Chronic kidney disease, stage 3 unspecified: Secondary | ICD-10-CM

## 2018-09-12 ENCOUNTER — Emergency Department (HOSPITAL_COMMUNITY)
Admission: EM | Admit: 2018-09-12 | Discharge: 2018-09-12 | Disposition: A | Discharge status: Home / Self Care | Payer: Medicare Other | Attending: Emergency Medicine | Admitting: Emergency Medicine

## 2018-09-12 ENCOUNTER — Other Ambulatory Visit: Payer: Self-pay

## 2018-09-12 ENCOUNTER — Encounter (HOSPITAL_COMMUNITY): Payer: Self-pay | Admitting: *Deleted

## 2018-09-12 DIAGNOSIS — Z8673 Personal history of transient ischemic attack (TIA), and cerebral infarction without residual deficits: Secondary | ICD-10-CM | POA: Insufficient documentation

## 2018-09-12 DIAGNOSIS — Z7984 Long term (current) use of oral hypoglycemic drugs: Secondary | ICD-10-CM | POA: Diagnosis not present

## 2018-09-12 DIAGNOSIS — X58XXXA Exposure to other specified factors, initial encounter: Secondary | ICD-10-CM | POA: Diagnosis not present

## 2018-09-12 DIAGNOSIS — Z5189 Encounter for other specified aftercare: Secondary | ICD-10-CM | POA: Diagnosis not present

## 2018-09-12 DIAGNOSIS — Y939 Activity, unspecified: Secondary | ICD-10-CM | POA: Diagnosis not present

## 2018-09-12 DIAGNOSIS — I129 Hypertensive chronic kidney disease with stage 1 through stage 4 chronic kidney disease, or unspecified chronic kidney disease: Secondary | ICD-10-CM | POA: Insufficient documentation

## 2018-09-12 DIAGNOSIS — S91302A Unspecified open wound, left foot, initial encounter: Secondary | ICD-10-CM | POA: Diagnosis present

## 2018-09-12 DIAGNOSIS — Z87891 Personal history of nicotine dependence: Secondary | ICD-10-CM | POA: Insufficient documentation

## 2018-09-12 DIAGNOSIS — Y999 Unspecified external cause status: Secondary | ICD-10-CM | POA: Diagnosis not present

## 2018-09-12 DIAGNOSIS — N183 Chronic kidney disease, stage 3 (moderate): Secondary | ICD-10-CM | POA: Diagnosis not present

## 2018-09-12 DIAGNOSIS — Z79899 Other long term (current) drug therapy: Secondary | ICD-10-CM | POA: Insufficient documentation

## 2018-09-12 DIAGNOSIS — Z7901 Long term (current) use of anticoagulants: Secondary | ICD-10-CM | POA: Insufficient documentation

## 2018-09-12 DIAGNOSIS — E1122 Type 2 diabetes mellitus with diabetic chronic kidney disease: Secondary | ICD-10-CM | POA: Diagnosis not present

## 2018-09-12 DIAGNOSIS — Y929 Unspecified place or not applicable: Secondary | ICD-10-CM | POA: Insufficient documentation

## 2018-09-12 DIAGNOSIS — E11621 Type 2 diabetes mellitus with foot ulcer: Secondary | ICD-10-CM | POA: Diagnosis not present

## 2018-09-12 NOTE — ED Provider Notes (Addendum)
[name] DEPT Provider Note   CSN: [id number] Arrival date & time: 09/12/18  1815    History   Chief Complaint Chief Complaint  Patient presents with  . Foot Pain    HPI Nathan Cook is a 85 y.o. male.     85 year old male presents with discomfort to his left foot after having a dressing change today at the wound care center.  Patient believes that because they did not place calcium alginate on the left foot that is why 7 symptoms.  Denies any fever or chills.  Has a history of peripheral vascular disease and is no longer a surgical candidate.  No drainage from the area.  No treatment use prior to arrival.     Past Medical History:  Diagnosis Date  . BPH (benign prostatic hyperplasia) 10/14/2014  . Cervicalgia 01/04/2016  . Chronic kidney disease   . Contusion of left shoulder 05/27/14  . DM type 2 (diabetes mellitus, type 2) ([place])   . Dysphagia   . High blood pressure   . History of basal cell cancer 11/21/2005   Right temple  . History of colonic polyps 10/14/2003  . Hyperlipidemia   . Left knee pain 05/27/14  . Peripheral vascular disease ([place])   . SAH (subarachnoid hemorrhage) ([place]) 2009   TBI  . Stroke ([place])   . TIA (transient ischemic attack) 11/06/2005   Left eye pain. Slurred speech. Diaphoresis. No residual effects.     Patient Active Problem List   Diagnosis Date Noted  . History of stroke with residual deficit 08/16/2018  . Non-healing open wound of toe 07/24/2018  . Stasis dermatitis of both legs 06/10/2018  . Dysphagia 04/12/2018  . Paroxysmal atrial fibrillation (HCC)   . Stroke ([place]) 04/01/2018  . PAD (peripheral artery disease) ([place]) 03/20/2018  . Generalized osteoarthritis 06/27/2017  . B12 deficiency 06/27/2017  . History of TIA (transient ischemic attack) 03/21/2017  . Vitamin D deficiency 03/21/2017  . Dry eyes, bilateral 03/21/2017  . CKD (chronic kidney disease) stage 3, GFR 30-59 ml/min (HCC)  03/21/2017  . Cervicalgia 01/04/2016  . Pain in joint, shoulder region 05/11/2015  . BPH (benign prostatic hyperplasia) 10/14/2014  . Hyperlipidemia LDL goal <70   . Benign hypertension with chronic kidney disease, stage III ([place])   . Controlled type 2 diabetes mellitus with stage 3 chronic kidney disease, without long-term current use of insulin ([place])   . Left knee pain 05/27/2014  . Brainstem infarct, acute ([place]) s/p IV tPA 11/06/2005  . History of colonic polyps 10/14/2003    Past Surgical History:  Procedure Laterality Date  . ABDOMINAL AORTOGRAM W/LOWER EXTREMITY N/A 06/12/2018   Procedure: ABDOMINAL AORTOGRAM W/LOWER EXTREMITY;  Surgeon: Marty Heck, MD;  Location: [facility];  Service: Cardiovascular;  Laterality: N/A;  . Achilles tendon repair Left 1998   ruptured tendon  . COLONOSCOPY  1992   Polyp removed  . COLONOSCOPY  1998   normal  . ORIF FEMUR FRACTURE Right 1929  . PERIPHERAL VASCULAR INTERVENTION Left 06/12/2018   Procedure: PERIPHERAL VASCULAR INTERVENTION;  Surgeon: Marty Heck, MD;  Location: [facility];  Service: Cardiovascular;  Laterality: Left;  Attempted   . TONSILLECTOMY          Home Medications    Prior to Admission medications   Medication Sig Start Date End Date Taking? Authorizing Provider  apixaban (ELIQUIS) 5 MG TABS tablet Take 1 tablet (5 mg total) by mouth 2 (two)  times daily. 06/18/18  Yes Wardell Honour, MD  Calcium Carb-Cholecalciferol (CALCIUM 600+D3 PO) Take 1 tablet by mouth daily.   Yes [provider]  Cholecalciferol 1000 UNITS tablet Take 1,000 Units by mouth daily.   Yes [provider]  furosemide (LASIX) 40 MG tablet Take 1 tablet (40 mg total) by mouth daily as needed. Take for leg swelling. Take only for three days in a row. Patient taking differently: Take 40 mg by mouth every other day.  08/15/18 11/13/18 Yes Buford Dresser, MD  gabapentin (NEURONTIN) 100 MG  capsule Take 1 capsule (100 mg total) by mouth at bedtime. 07/24/18  Yes Virgie Dad, MD  losartan (COZAAR) 25 MG tablet Take 1 tablet (25 mg total) by mouth 2 (two) times daily. 06/18/18  Yes Wardell Honour, MD  metFORMIN (GLUCOPHAGE) 500 MG tablet Take 1 tablet (500 mg total) by mouth 2 (two) times daily with a meal. 06/18/18  Yes Wardell Honour, MD  metoprolol tartrate (LOPRESSOR) 25 MG tablet Take 1 tablet (25 mg total) by mouth 2 (two) times daily. 06/18/18  Yes Wardell Honour, MD  simvastatin (ZOCOR) 10 MG tablet Take 1 tablet (10 mg total) by mouth daily. 07/30/18  Yes Virgie Dad, MD  acetaminophen (TYLENOL 8 HOUR) 650 MG CR tablet Take 1 tablet (650 mg total) by mouth every 8 (eight) hours as needed for pain. 09/19/17   Blanchie Serve, MD  Propylene Glycol-Glycerin 1-0.3 % SOLN Place 2 drops into both eyes as needed (dry eyes).     [provider]    Family History Family History  Problem Relation Age of Onset  . Heart attack Mother   . Heart disease Mother   . Heart disease Father     Social History Social History   Tobacco Use  . Smoking status: Former Smoker    Last attempt to quit: 06/26/1971    Years since quitting: 47.2  . Smokeless tobacco: Never Used  . Tobacco comment: 2 1/2 packs a day, until 1972  Substance Use Topics  . Alcohol use: Yes    Alcohol/week: 1.0 - 2.0 standard drinks    Types: 1 - 2 Glasses of wine per week    Comment: Wine nightly  . Drug use: No     Allergies   Patient has no known allergies.   Review of Systems Review of Systems  All other systems reviewed and are negative.    Physical Exam Updated Vital Signs BP 134/66 (BP Location: Left Arm)   Pulse 74   Temp (!) 97.3 F (36.3 C) (Oral)   Resp 20   SpO2 96%   Physical Exam Vitals signs and nursing note reviewed.  Constitutional:      General: He is not in acute distress.    Appearance: Normal appearance. He is well-developed. He is not  toxic-appearing.  HENT:     Head: Normocephalic and atraumatic.  Eyes:     General: Lids are normal.     Conjunctiva/sclera: Conjunctivae normal.     Pupils: Pupils are equal, round, and reactive to light.  Neck:     Musculoskeletal: Normal range of motion and neck supple.     Thyroid: No thyroid mass.     Trachea: No tracheal deviation.  Cardiovascular:     Rate and Rhythm: Normal rate and regular rhythm.     Heart sounds: Normal heart sounds. No murmur. No gallop.   Pulmonary:     Effort: Pulmonary effort is  normal. No respiratory distress.     Breath sounds: Normal breath sounds. No stridor. No decreased breath sounds, wheezing, rhonchi or rales.  Abdominal:     General: Bowel sounds are normal. There is no distension.     Palpations: Abdomen is soft.     Tenderness: There is no abdominal tenderness. There is no rebound.  Musculoskeletal: Normal range of motion.        General: No tenderness.       Feet:  Skin:    General: Skin is warm and dry.     Findings: Erythema and wound present.  Neurological:     Mental Status: He is alert and oriented to person, place, and time. Mental status is at baseline.     GCS: GCS eye subscore is 4. GCS verbal subscore is 5. GCS motor subscore is 6.     Cranial Nerves: No cranial nerve deficit.     Sensory: No sensory deficit.  Psychiatric:        Attention and Perception: Attention normal.      ED Treatments / Results  Labs (all labs ordered are listed, but only abnormal results are displayed) Labs Reviewed - No data to display  EKG None  Radiology No results found.  Procedures Procedures (including critical care time)  Medications Ordered in ED Medications - No data to display   Initial Impression / Assessment and Plan / ED Course  I have reviewed the triage vital signs and the nursing notes.  Pertinent labs & imaging results that were available during my care of the patient were reviewed by me and considered in my  medical decision making (see chart for details).        Patient is wound cleansed by nursing and Vaseline gauze placed by nursing.  Patient to follow-up wound care tomorrow  Final Clinical Impressions(s) / ED Diagnoses   Final diagnoses:  None    ED Discharge Orders    None       Lacretia Leigh, MD 09/12/18 1936    Lacretia Leigh, MD 09/12/18 [phone]

## 2018-09-12 NOTE — ED Triage Notes (Signed)
Pt presently has a wound in his L foot, is getting dressing changes Q 3-4 days.  He went to the foot center for dsg change, when he arrived home, he reports having burning sensation on his foot.  He reports the nurse did not apply the same cream that he normally gets today.

## 2018-09-12 NOTE — ED Notes (Signed)
Per MD request, wounds on bilateral feet cleaned, Vaseline gauze applied, wrapped in additional gauze.

## 2018-09-12 NOTE — Discharge Instructions (Addendum)
Follow-up in the wound care center tomorrow

## 2018-09-13 ENCOUNTER — Encounter (HOSPITAL_BASED_OUTPATIENT_CLINIC_OR_DEPARTMENT_OTHER): Payer: Medicare Other

## 2018-09-14 NOTE — Progress Notes (Signed)
Location: [place] of Service:  Clinic (12)  Provider:   Code Status:  Goals of Care:  Advanced Directives 09/03/2018  Does Patient Have a Medical Advance Directive? Yes  Type of Paramedic of [place];Living will  Does patient want to make changes to medical advance directive? -  Copy of [place] in Chart? Yes - validated most recent copy scanned in chart (See row information)     Chief Complaint  Patient presents with  . Medical Management of Chronic Issues    2 week follow up     HPI: Patient is a 85 y.o. male seen today for an acute visit for Follow up of hi slabs and wound Patient has h/o Hypertension, Hyperlipidemia, Diabetes mellitus, h/o Brain stem Infarct s/p TPA In 10/19, New Onset Atrial Fibrillation on Eliquis since 10/19.,PAD  Patient had non healing Ulcer on his Left Great Toe. He was  Seen by Vascular.Per their notes Patient has failedrevascularization. And at this time he is not interested in Amputation. Plan is for Palliative Wound Care.  Patient came 2 weeks ago to follow-up on his left toe nonhealing wound.  But he had a macerated skin around his other toes and redness in and around the feet.  He told me he was soaking his feet in Epsom salts for 20 minutes to clean it.  His feet was draining.  He was taking Lasix as ordered by cardiology. I recommended him to go to ED and try to arrange to advance home nurse for wound care.  We also tried to send him to a wound care clinic.  He refused initially to go to ED.  I sent him home with prescription of doxycycline. He finally did go to ED because his foot was having discharge.  In ED it was wrapped with silver alginate.  He also was able to get into wound care clinic and was able to see Dr. Dellia Nims.  He is getting silver alginate for his toes and Santyl for his Achilles wound.  They are also trying to arrange for the nurse to do the dressing but have been  unable to do it.  Patient is going to their clinic 2-3 times a week to get his dressing changed. Patient today came to  reviewe his labs.  His A1c was mildly elevated but that is most likely due to his leg wound.  He has not been checking his sugars at home. His pain seems to be controlled on Tylenol so far.  He is taking Lasix every other day for his leg swelling. He is able to walk on that feet Past Medical History:  Diagnosis Date  . BPH (benign prostatic hyperplasia) 10/14/2014  . Cervicalgia 01/04/2016  . Chronic kidney disease   . Contusion of left shoulder 05/27/14  . DM type 2 (diabetes mellitus, type 2) ([place])   . Dysphagia   . High blood pressure   . History of basal cell cancer 11/21/2005   Right temple  . History of colonic polyps 10/14/2003  . Hyperlipidemia   . Left knee pain 05/27/14  . Peripheral vascular disease ([place])   . SAH (subarachnoid hemorrhage) ([place]) 2009   TBI  . Stroke ([place])   . TIA (transient ischemic attack) 11/06/2005   Left eye pain. Slurred speech. Diaphoresis. No residual effects.     Past Surgical History:  Procedure Laterality Date  . ABDOMINAL AORTOGRAM W/LOWER EXTREMITY N/A 06/12/2018   Procedure: ABDOMINAL AORTOGRAM  W/LOWER EXTREMITY;  Surgeon: Marty Heck, MD;  Location: [place] [facility];  Service: Cardiovascular;  Laterality: N/A;  . Achilles tendon repair Left 1998   ruptured tendon  . COLONOSCOPY  1992   Polyp removed  . COLONOSCOPY  1998   normal  . ORIF FEMUR FRACTURE Right 1929  . PERIPHERAL VASCULAR INTERVENTION Left 06/12/2018   Procedure: PERIPHERAL VASCULAR INTERVENTION;  Surgeon: Marty Heck, MD;  Location: [facility];  Service: Cardiovascular;  Laterality: Left;  Attempted   . TONSILLECTOMY      No Known Allergies  Outpatient Encounter Medications as of 09/11/2018  Medication Sig  . acetaminophen (TYLENOL 8 HOUR) 650 MG CR tablet Take 1 tablet (650 mg total) by mouth every 8 (eight) hours as needed for  pain.  Marland Kitchen apixaban (ELIQUIS) 5 MG TABS tablet Take 1 tablet (5 mg total) by mouth 2 (two) times daily.  . Calcium Carb-Cholecalciferol (CALCIUM 600+D3 PO) Take 1 tablet by mouth daily.  . Cholecalciferol 1000 UNITS tablet Take 1,000 Units by mouth daily.  . furosemide (LASIX) 40 MG tablet Take 1 tablet (40 mg total) by mouth daily as needed. Take for leg swelling. Take only for three days in a row. (Patient taking differently: Take 40 mg by mouth every other day. )  . gabapentin (NEURONTIN) 100 MG capsule Take 1 capsule (100 mg total) by mouth at bedtime.  Marland Kitchen losartan (COZAAR) 25 MG tablet Take 1 tablet (25 mg total) by mouth 2 (two) times daily.  . metFORMIN (GLUCOPHAGE) 500 MG tablet Take 1 tablet (500 mg total) by mouth 2 (two) times daily with a meal.  . metoprolol tartrate (LOPRESSOR) 25 MG tablet Take 1 tablet (25 mg total) by mouth 2 (two) times daily.  Marland Kitchen Propylene Glycol-Glycerin 1-0.3 % SOLN Place 2 drops into both eyes as needed (dry eyes).   . simvastatin (ZOCOR) 10 MG tablet Take 1 tablet (10 mg total) by mouth daily.   No facility-administered encounter medications on file as of 09/11/2018.     Review of Systems:  Review of Systems  Health Maintenance  Topic Date Due  . OPHTHALMOLOGY EXAM  07/07/2017  . FOOT EXAM  06/27/2018  . HEMOGLOBIN A1C  03/01/2019  . TETANUS/TDAP  04/29/2027  . INFLUENZA VACCINE  Completed  . PNA vac Low Risk Adult  Completed    Physical Exam: Vitals:   09/11/18 1617  BP: (!) 118/58  Pulse: 69  Temp: 97.6 F (36.4 C)  TempSrc: Oral  SpO2: 95%  Weight: 149 lb 12.8 oz (67.9 kg)  Height: 5\' 9"  (1.753 m)   Body mass index is 22.12 kg/m. Physical Exam  Labs reviewed: Basic Metabolic Panel: Recent Labs    04/04/18 0426 04/06/18 0348 06/12/18 0904 08/29/18 0815  NA 138 141 137 135  K 3.8 3.7 4.1 4.4  CL 105 110 102 99  CO2 18* 24  --  25  GLUCOSE 156* 141* 142* 173*  BUN 28* 24* 15 22  CREATININE 1.30* 1.10 1.30* 1.44*  CALCIUM  8.4* 8.4*  --  9.2   Liver Function Tests: Recent Labs    03/13/18 0000 04/01/18 1432 08/29/18 0815  AST 24 25 15   ALT 26 19 13   ALKPHOS  --  57  --   BILITOT 0.9 0.9 0.7  PROT 6.6 6.6 6.0*  ALBUMIN  --  3.7  --    No results for input(s): LIPASE, AMYLASE in the last 8760 hours. No results for input(s): AMMONIA  in the last 8760 hours. CBC: Recent Labs    04/01/18 1432  04/04/18 0426 04/06/18 0348 06/12/18 0904 08/29/18 0815  WBC 8.9   < > 15.2* 12.4*  --  11.7*  NEUTROABS 6.3  --   --   --   --  8,471*  HGB 12.7*   < > 13.3 13.5 11.9* 12.0*  HCT 38.4*   < > 40.0 40.5 35.0* 34.5*  MCV 104.9*   < > 101.8* 102.0*  --  99.7  PLT 317   < > 338 336  --  455*   < > = values in this interval not displayed.   Lipid Panel: Recent Labs    03/13/18 0000 04/02/18 0259 08/29/18 0815  CHOL 154 143 126  HDL 57 57 49  LDLCALC 78 80 59  TRIG 104 29 95  CHOLHDL 2.7 2.5 2.6   Lab Results  Component Value Date   HGBA1C 8.0 (H) 08/29/2018    Procedures since last visit: No results found.  Assessment/Plan Nonhealing open wound of Left  toes Patient is on silver alginate dressing.  With Santyl for the lateral Achilles wound. He is going to wound care every few days to get his dressing changed. We are working with the friends home nurse to see if advanced home care can come to his apartment to change the dressing. I also offered patient to move to a skilled facility but he takes care of his wife who has dementia and is unable to leave her alone Leucocytosis Probably due to Infection Will continue to follow Peripheral  arterial disease Conservative management right now per vascular On statin and Eliquis Patient seems more open for amputation if needed right now Type 2 diabetes mellitus On Metformin His A1c was elevated at 8 was likely due to leg infection Patient will check his blood sugars twice a week and bring it in next appointment Neuropathy Continue Tylenol and  Neurontin Paroxysmal A. fib Tolerating Eliquis andmetoprolol Hyperlipidemia On Zocor LDL less than 70 Cerebrovascular accident (CVA) Recovered well  Continue on Statin BP controlled On Eliquis  CKD (chronic kidney disease) stage 3,  Creat stable Repeat BMP on Lasix for LE edema showed mildily elevated Continue to monitor  Hyperlipidemia LDL goal <70 On Zocor Repeat Lipid  Hypertension On Losartan and Metoprolol     Labs/tests ordered:  @ORDERS @ Next appt:  12/05/2018 Total time spent in this patient care encounter was 40_ minutes; greater than 50% of the visit spent counseling patient, reviewing records , Labs and coordinating care for problems addressed at this encounter.

## 2018-09-17 NOTE — Addendum Note (Signed)
Addended by: Georgina Snell on: 09/17/2018 08:30 PM   Modules accepted: Level of Service

## 2018-09-20 DIAGNOSIS — E11621 Type 2 diabetes mellitus with foot ulcer: Secondary | ICD-10-CM | POA: Diagnosis not present

## 2018-09-22 ENCOUNTER — Other Ambulatory Visit: Payer: Self-pay | Admitting: Internal Medicine

## 2018-09-24 ENCOUNTER — Encounter: Payer: Self-pay | Admitting: Internal Medicine

## 2018-09-24 NOTE — Progress Notes (Signed)
A user error has taken place.

## 2018-09-24 NOTE — Progress Notes (Signed)
Location: [place] of Service:  Clinic (12)  Provider:   Code Status:  Goals of Care:  Advanced Directives 09/03/2018  Does Patient Have a Medical Advance Directive? Yes  Type of Paramedic of [place];Living will  Does patient want to make changes to medical advance directive? -  Copy of [place] in Chart? Yes - validated most recent copy scanned in chart (See row information)     Chief Complaint  Patient presents with  . Acute Visit    follow up with wound on left toe    HPI: Patient is a 85 y.o. male seen today for an acute visit for Follow up of his wound Patient has h/o Hypertension, Hyperlipidemia, Diabetes mellitus, h/o Brain stem Infarct s/p TPA  In 10/19, New Onset Atrial Fibrillation on Eliquis since 10/19.,PAD   Patient has non healing Ulcer on his Left Great Toe. He was recently Seen by Vascular.Per their notes Patient has failed revascularization . And at this time he is not interested in Amputation. Plan is for Palliative Wound Care. I had prescribed him to use Santyl for this wound and this was the follow up visit. Patient is using the Santyl as prescribed on his Toe. He continues to have claudication pain in his leg with tingling sensation in both his legs especially at night.  He has to sleep in a recliner with his legs hanging down.  Past Medical History:  Diagnosis Date  . BPH (benign prostatic hyperplasia) 10/14/2014  . Cervicalgia 01/04/2016  . Chronic kidney disease   . Contusion of left shoulder 05/27/14  . DM type 2 (diabetes mellitus, type 2) ([place])   . Dysphagia   . High blood pressure   . History of basal cell cancer 11/21/2005   Right temple  . History of colonic polyps 10/14/2003  . Hyperlipidemia   . Left knee pain 05/27/14  . Peripheral vascular disease ([place])   . SAH (subarachnoid hemorrhage) ([place]) 2009   TBI  . Stroke ([place])   . TIA (transient ischemic attack) 11/06/2005   Left  eye pain. Slurred speech. Diaphoresis. No residual effects.     Past Surgical History:  Procedure Laterality Date  . ABDOMINAL AORTOGRAM W/LOWER EXTREMITY N/A 06/12/2018   Procedure: ABDOMINAL AORTOGRAM W/LOWER EXTREMITY;  Surgeon: Marty Heck, MD;  Location: [facility];  Service: Cardiovascular;  Laterality: N/A;  . Achilles tendon repair Left 1998   ruptured tendon  . COLONOSCOPY  1992   Polyp removed  . COLONOSCOPY  1998   normal  . ORIF FEMUR FRACTURE Right 1929  . PERIPHERAL VASCULAR INTERVENTION Left 06/12/2018   Procedure: PERIPHERAL VASCULAR INTERVENTION;  Surgeon: Marty Heck, MD;  Location: [facility];  Service: Cardiovascular;  Laterality: Left;  Attempted   . TONSILLECTOMY      No Known Allergies  Outpatient Encounter Medications as of 08/07/2018  Medication Sig  . acetaminophen (TYLENOL 8 HOUR) 650 MG CR tablet Take 1 tablet (650 mg total) by mouth every 8 (eight) hours as needed for pain.  Marland Kitchen apixaban (ELIQUIS) 5 MG TABS tablet Take 1 tablet (5 mg total) by mouth 2 (two) times daily.  . Calcium Carb-Cholecalciferol (CALCIUM 600+D3 PO) Take 1 tablet by mouth daily.  . Cholecalciferol 1000 UNITS tablet Take 1,000 Units by mouth daily.  Marland Kitchen losartan (COZAAR) 25 MG tablet Take 1 tablet (25 mg total) by mouth 2 (two) times daily.  . metFORMIN (GLUCOPHAGE) 500  MG tablet Take 1 tablet (500 mg total) by mouth 2 (two) times daily with a meal.  . metoprolol tartrate (LOPRESSOR) 25 MG tablet Take 1 tablet (25 mg total) by mouth 2 (two) times daily.  Marland Kitchen Propylene Glycol-Glycerin 1-0.3 % SOLN Place 2 drops into both eyes as needed (dry eyes).   . simvastatin (ZOCOR) 10 MG tablet Take 1 tablet (10 mg total) by mouth daily.  . [DISCONTINUED] clindamycin (CLEOCIN) 300 MG capsule Take 1 capsule by mouth 3 (three) times daily.   . [DISCONTINUED] collagenase (SANTYL) ointment Apply 1 application topically daily. (Patient not taking: Reported on 09/03/2018)  .  [DISCONTINUED] gabapentin (NEURONTIN) 100 MG capsule Take 1 capsule (100 mg total) by mouth at bedtime.  . [DISCONTINUED] silver sulfADIAZINE (SILVADENE) 1 % cream Apply 1 application topically daily.  . [DISCONTINUED] triamcinolone lotion (KENALOG) 0.1 % APPLY TO AFFECTED AREA(S) THREE TIMES A DAY (Patient not taking: Reported on 09/03/2018)   No facility-administered encounter medications on file as of 08/07/2018.     Review of Systems:  Review of Systems  Constitutional: Negative.   HENT: Negative.   Respiratory: Negative.   Cardiovascular: Negative.   Gastrointestinal: Negative.   Genitourinary: Negative.   Musculoskeletal: Negative.   Skin: Positive for wound.  Neurological: Negative.   Psychiatric/Behavioral: Negative.   All other systems reviewed and are negative.   Health Maintenance  Topic Date Due  . OPHTHALMOLOGY EXAM  07/07/2017  . FOOT EXAM  06/27/2018  . HEMOGLOBIN A1C  03/01/2019  . TETANUS/TDAP  04/29/2027  . INFLUENZA VACCINE  Completed  . PNA vac Low Risk Adult  Completed    Physical Exam: Vitals:   08/07/18 1432  BP: 126/60  Pulse: 73  SpO2: 94%  Weight: 157 lb 3.2 oz (71.3 kg)  Height: 5\' 8"  (1.727 m)   Body mass index is 23.9 kg/m. Physical Exam Vitals signs reviewed.  Constitutional:      Appearance: Normal appearance.  HENT:     Head: Normocephalic.     Nose: Nose normal.     Mouth/Throat:     Mouth: Mucous membranes are moist.     Pharynx: Oropharynx is clear.  Neck:     Musculoskeletal: Neck supple.  Cardiovascular:     Rate and Rhythm: Normal rate and regular rhythm.     Heart sounds: Murmur present.  Pulmonary:     Effort: Pulmonary effort is normal. No respiratory distress.     Breath sounds: Normal breath sounds. No wheezing or rales.  Abdominal:     General: Abdomen is flat. There is no distension.     Palpations: Abdomen is soft.     Tenderness: There is no abdominal tenderness. There is no guarding.  Musculoskeletal:      Comments: Chronic Venous Changes Bilateral  Skin:    Comments: Left Foot Exam Was warm had feeble Pulses Had Non Healing Toe Ulcer with Redness around it. And eschar on the Base. No Smell or Discharge  Neurological:     Mental Status: He is alert and oriented to person, place, and time.     Comments: Walks with the walker with no recent Falls Has mild weakness in Right UE  Psychiatric:        Mood and Affect: Mood normal.        Behavior: Behavior normal.        Thought Content: Thought content normal.     Labs reviewed: Basic Metabolic Panel: Recent Labs    04/04/18 0426 04/06/18 0348  06/12/18 0904 08/29/18 0815  NA 138 141 137 135  K 3.8 3.7 4.1 4.4  CL 105 110 102 99  CO2 18* 24  --  25  GLUCOSE 156* 141* 142* 173*  BUN 28* 24* 15 22  CREATININE 1.30* 1.10 1.30* 1.44*  CALCIUM 8.4* 8.4*  --  9.2   Liver Function Tests: Recent Labs    03/13/18 0000 04/01/18 1432 08/29/18 0815  AST 24 25 15   ALT 26 19 13   ALKPHOS  --  57  --   BILITOT 0.9 0.9 0.7  PROT 6.6 6.6 6.0*  ALBUMIN  --  3.7  --    No results for input(s): LIPASE, AMYLASE in the last 8760 hours. No results for input(s): AMMONIA in the last 8760 hours. CBC: Recent Labs    04/01/18 1432  04/04/18 0426 04/06/18 0348 06/12/18 0904 08/29/18 0815  WBC 8.9   < > 15.2* 12.4*  --  11.7*  NEUTROABS 6.3  --   --   --   --  8,471*  HGB 12.7*   < > 13.3 13.5 11.9* 12.0*  HCT 38.4*   < > 40.0 40.5 35.0* 34.5*  MCV 104.9*   < > 101.8* 102.0*  --  99.7  PLT 317   < > 338 336  --  455*   < > = values in this interval not displayed.   Lipid Panel: Recent Labs    03/13/18 0000 04/02/18 0259 08/29/18 0815  CHOL 154 143 126  HDL 57 57 49  LDLCALC 78 80 59  TRIG 104 29 95  CHOLHDL 2.7 2.5 2.6   Lab Results  Component Value Date   HGBA1C 8.0 (H) 08/29/2018    Procedures since last visit: No results found.  Assessment/Plan Non Healing Wound of Left Toe with PAD and diabetes Plan to continue  Santyl and follow up with me in 2 weeks Will possible consider Wound care clinic if no improvement   Labs/tests ordered:  @ORDERS @ Next appt:  08/29/2018

## 2018-09-25 ENCOUNTER — Telehealth: Payer: Self-pay

## 2018-09-25 NOTE — Telephone Encounter (Signed)
Spoke with Nathan Cook at [facility], [place] provided an order for a power wheel chair and [facility] Physical Therapist is unable to process. Patient is set up with [facility] and the order will need to be sent to them via fax from our office. I informed Nathan Cook that I was unable to locate an order in our system for it was probably written there and is in the [employer] System. Nathan Cook is aware I will forward to Dr.Gupta and the [facility] medical assistants to see if order is available in [employer], If yes order to be printed and faxed to number below.  Nathan Cook would like a return call when this request is complete  [employer] [phone] Fax [phone]

## 2018-09-26 ENCOUNTER — Telehealth: Payer: Self-pay

## 2018-09-26 NOTE — Telephone Encounter (Signed)
Per Dr.Gupta, I did not order power wheelchair for him. I wonder if he is confused and Wound care made those orders.  Tell him to call them  I called [facility] and left message informing him of Dr.Gupta's response, I also advised that we have a NP on site if additional assistance is needed.  S.Chrae B/CMA

## 2018-09-26 NOTE — Telephone Encounter (Signed)
Explained to patient what provider said. Patient stated that he did not have orders for wheel chair but was calling because he needs orders for power wheel chair and is requesting for provider to please call him. He would like to discuss matters further about this. His number is [phone]. He is requesting a form be faxed to [facility].   [employer] Information Phone [phone] Fax [phone]

## 2018-09-26 NOTE — Telephone Encounter (Signed)
Called patient and scheduled an appointment to discuss wheel chair with Dr. Lyndel Safe next Wednesday 10/02/2018 at 1 pm.

## 2018-10-02 ENCOUNTER — Other Ambulatory Visit: Payer: Self-pay

## 2018-10-02 ENCOUNTER — Non-Acute Institutional Stay: Payer: Medicare Other | Admitting: Family

## 2018-10-02 ENCOUNTER — Encounter: Payer: Self-pay | Admitting: Family

## 2018-10-02 VITALS — BP 114/56 | HR 63 | Temp 98.1°F | Resp 18 | Ht 69.0 in | Wt 140.2 lb

## 2018-10-02 DIAGNOSIS — I739 Peripheral vascular disease, unspecified: Secondary | ICD-10-CM | POA: Diagnosis not present

## 2018-10-02 DIAGNOSIS — R2681 Unsteadiness on feet: Secondary | ICD-10-CM

## 2018-10-02 DIAGNOSIS — S91109S Unspecified open wound of unspecified toe(s) without damage to nail, sequela: Secondary | ICD-10-CM | POA: Diagnosis not present

## 2018-10-02 NOTE — Patient Instructions (Signed)
Order placed for Physical Therapy to evaluate for power wheelchair

## 2018-10-02 NOTE — Progress Notes (Signed)
Location:  [place] of Service:  Clinic (12) Provider: Raunel Dimartino FNP-C  Virgie Dad, MD  Patient Care Team: Virgie Dad, MD as PCP - General (Internal Medicine) Buford Dresser, MD as PCP - Cardiology (Cardiology)  Extended Emergency Contact Information Primary Emergency Contact: Gordon,[place] Address: [address]          [place],  [address] [name] of [place] Phone: [phone] Relation: Daughter  Goals of care: Advanced Directive information Advanced Directives 10/02/2018  Does Patient Have a Medical Advance Directive? Yes  Type of Advance Directive Living will  Does patient want to make changes to medical advance directive? No - Patient declined  Copy of [place] in Chart? Yes - validated most recent copy scanned in chart (See row information)     Chief Complaint  Patient presents with  . Acute Visit    would like order for power wheelchair     HPI:  Pt is a 85 y.o. male seen today at [facility] clinic for an acute visit for face to face for evaluation of need for power wheelchair.He has a medical history of Hypertension,Hyperlipidemia,PAD,Afib,Hx stroke,Type 2 DM,OA,BPH,CKD stage 3 among other conditions.He states has been using Rolator but due to wounds on both feet has had to sit on Rolator seat and self propel using feet which is getting harder for him.He states continues to have wound dressing changes at the wound care center and also has Home health Nurse who does dressing changes three times a week.He denies any pain on the legs " I've no feelings on my feet " and also has Neuropathy on the fingers.He has an upcoming appointment on 10/04/2018 at the wound center states has decided to left leg amputation due to nonhealing wound and poor circulation.He reports no recent fall episodes.Bilateral lower extremities dressing done by home health prior to visit.Patient declines for provider to assess  wounds during visit.He denies any fever or chills.He states has not been checking his blood sugars has difficulty operating glucometer due to neuropathy on the fingers.    Past Medical History:  Diagnosis Date  . BPH (benign prostatic hyperplasia) 10/14/2014  . Cervicalgia 01/04/2016  . Chronic kidney disease   . Contusion of left shoulder 05/27/14  . DM type 2 (diabetes mellitus, type 2) ([place])   . Dysphagia   . High blood pressure   . History of basal cell cancer 11/21/2005   Right temple  . History of colonic polyps 10/14/2003  . Hyperlipidemia   . Left knee pain 05/27/14  . Peripheral vascular disease ([place])   . SAH (subarachnoid hemorrhage) ([place]) 2009   TBI  . Stroke ([place])   . TIA (transient ischemic attack) 11/06/2005   Left eye pain. Slurred speech. Diaphoresis. No residual effects.    Past Surgical History:  Procedure Laterality Date  . ABDOMINAL AORTOGRAM W/LOWER EXTREMITY N/A 06/12/2018   Procedure: ABDOMINAL AORTOGRAM W/LOWER EXTREMITY;  Surgeon: Marty Heck, MD;  Location: [facility];  Service: Cardiovascular;  Laterality: N/A;  . Achilles tendon repair Left 1998   ruptured tendon  . COLONOSCOPY  1992   Polyp removed  . COLONOSCOPY  1998   normal  . ORIF FEMUR FRACTURE Right 1929  . PERIPHERAL VASCULAR INTERVENTION Left 06/12/2018   Procedure: PERIPHERAL VASCULAR INTERVENTION;  Surgeon: Marty Heck, MD;  Location: [facility];  Service: Cardiovascular;  Laterality: Left;  Attempted   . TONSILLECTOMY  No Known Allergies  Outpatient Encounter Medications as of 10/02/2018  Medication Sig  . acetaminophen (TYLENOL 8 HOUR) 650 MG CR tablet Take 1 tablet (650 mg total) by mouth every 8 (eight) hours as needed for pain.  Marland Kitchen apixaban (ELIQUIS) 5 MG TABS tablet Take 1 tablet (5 mg total) by mouth 2 (two) times daily.  . Calcium Carb-Cholecalciferol (CALCIUM 600+D3 PO) Take 1 tablet by mouth daily.  . Cholecalciferol 1000 UNITS tablet Take  1,000 Units by mouth daily.  . furosemide (LASIX) 40 MG tablet Take 1 tablet (40 mg total) by mouth daily as needed. Take for leg swelling. Take only for three days in a row. (Patient taking differently: Take 40 mg by mouth every other day. )  . gabapentin (NEURONTIN) 100 MG capsule TAKE ONE CAPSULE BY MOUTH EVERY NIGHT AT BEDTIME  . losartan (COZAAR) 25 MG tablet Take 1 tablet (25 mg total) by mouth 2 (two) times daily.  . metFORMIN (GLUCOPHAGE) 500 MG tablet Take 1 tablet (500 mg total) by mouth 2 (two) times daily with a meal.  . metoprolol tartrate (LOPRESSOR) 25 MG tablet Take 1 tablet (25 mg total) by mouth 2 (two) times daily.  Marland Kitchen Propylene Glycol-Glycerin 1-0.3 % SOLN Place 2 drops into both eyes as needed (dry eyes).   . simvastatin (ZOCOR) 10 MG tablet Take 1 tablet (10 mg total) by mouth daily.   No facility-administered encounter medications on file as of 10/02/2018.     Review of Systems  Constitutional: Negative for appetite change, chills, fatigue and fever.  HENT: Negative for congestion, rhinorrhea, sinus pressure, sinus pain, sneezing and sore throat.        Change of voice post stroke   Eyes: Positive for visual disturbance. Negative for redness and itching.       Wears eye glasses   Respiratory: Negative for cough, chest tightness, shortness of breath and wheezing.   Cardiovascular: Negative for chest pain, palpitations and leg swelling.  Gastrointestinal: Negative for abdominal distention, abdominal pain, constipation, diarrhea, nausea and vomiting.  Endocrine: Negative for polydipsia, polyphagia and polyuria.  Genitourinary: Negative for difficulty urinating, dysuria and flank pain.  Musculoskeletal: Positive for arthralgias and gait problem.       Left shoulder chronic pain due to OA   Skin: Positive for wound. Negative for color change, pallor and rash.       Left great toe and right great toe dressing done prior to visit by home health Nurse patient declined for  dressing to be removed during visit   Neurological: Positive for numbness. Negative for dizziness and headaches.       Reports no feelings to left foot and numbness and tingling sensation of fingers.  Psychiatric/Behavioral: Negative for agitation and sleep disturbance. The patient is not nervous/anxious.     Immunization History  Administered Date(s) Administered  . DTaP 12/31/2012  . Influenza, High Dose Seasonal PF 04/04/2017  . Influenza,inj,Quad PF,6+ Mos 03/28/2018  . Influenza-Unspecified 04/10/2014, 03/25/2015, 04/06/2016  . PPD Test 04/29/2014  . Pneumococcal Conjugate-13 04/28/2017  . Pneumococcal-Unspecified 03/13/2011  . Tdap 04/28/2017  . Zoster Recombinat (Shingrix) 12/14/2017   Pertinent  Health Maintenance Due  Topic Date Due  . OPHTHALMOLOGY EXAM  07/07/2017  . FOOT EXAM  06/27/2018  . INFLUENZA VACCINE  01/25/2019  . HEMOGLOBIN A1C  03/01/2019  . PNA vac Low Risk Adult  Completed   Fall Risk  10/02/2018 09/11/2018 08/28/2018 08/08/2018 08/07/2018  Falls in the past year? 0 0 0 0 0  Number falls in past yr: 0 0 0 - 0  Injury with Fall? 0 0 0 - 0  Risk for fall due to : - - - - -    Vitals:   10/02/18 1310  BP: (!) 114/56  Pulse: 63  Resp: 18  Temp: 98.1 F (36.7 C)  TempSrc: Oral  SpO2: 93%  Weight: 140 lb 3.2 oz (63.6 kg)  Height: 5\' 9"  (1.753 m)   Body mass index is 20.7 kg/m. Physical Exam Vitals signs reviewed.  Constitutional:      General: He is not in acute distress.    Appearance: He is not ill-appearing.  HENT:     Head: Normocephalic.     Right Ear: Tympanic membrane, ear canal and external ear normal. There is no impacted cerumen.     Left Ear: Tympanic membrane, ear canal and external ear normal. There is no impacted cerumen.     Nose: Nose normal. No congestion or rhinorrhea.     Mouth/Throat:     Mouth: Mucous membranes are moist.     Pharynx: Oropharynx is clear. No oropharyngeal exudate or posterior oropharyngeal erythema.  Eyes:      General: No scleral icterus.       Right eye: No discharge.        Left eye: No discharge.     Conjunctiva/sclera: Conjunctivae normal.     Pupils: Pupils are equal, round, and reactive to light.     Comments: Corrective lens in place   Neck:     Musculoskeletal: Normal range of motion. No neck rigidity or muscular tenderness.     Vascular: No carotid bruit.  Cardiovascular:     Rate and Rhythm: Normal rate and regular rhythm.     Pulses: Normal pulses.     Heart sounds: Murmur present. No friction rub. No gallop.   Pulmonary:     Effort: Pulmonary effort is normal. No respiratory distress.     Breath sounds: Normal breath sounds. No wheezing, rhonchi or rales.  Chest:     Chest wall: No tenderness.  Abdominal:     General: Bowel sounds are normal. There is no distension.     Palpations: Abdomen is soft. There is no mass.     Tenderness: There is no abdominal tenderness. There is no right CVA tenderness, left CVA tenderness, guarding or rebound.  Musculoskeletal:     Right lower leg: No edema.     Left lower leg: No edema.     Comments: Unsteady gait self propels using legs on Rolator.Right shoulder limited ROM due to pain.   Lymphadenopathy:     Cervical: No cervical adenopathy.  Skin:    General: Skin is warm and dry.     Coloration: Skin is not pale.     Findings: No erythema or rash.     Comments: Feet dressing dry,clean and intact unable to exam wound patient declined for dressing to be removed.Dressing changed by home health Nurse prior to this visit.   Neurological:     Mental Status: He is alert and oriented to person, place, and time.     Gait: Gait abnormal.     Comments: Forgetful at times.   Psychiatric:        Mood and Affect: Mood normal.        Behavior: Behavior normal.        Thought Content: Thought content normal.        Judgment: Judgment normal.     Labs  reviewed: Recent Labs    04/04/18 0426 04/06/18 0348 06/12/18 0904 08/29/18 0815  NA  138 141 137 135  K 3.8 3.7 4.1 4.4  CL 105 110 102 99  CO2 18* 24  --  25  GLUCOSE 156* 141* 142* 173*  BUN 28* 24* 15 22  CREATININE 1.30* 1.10 1.30* 1.44*  CALCIUM 8.4* 8.4*  --  9.2   Recent Labs    03/13/18 0000 04/01/18 1432 08/29/18 0815  AST 24 25 15   ALT 26 19 13   ALKPHOS  --  57  --   BILITOT 0.9 0.9 0.7  PROT 6.6 6.6 6.0*  ALBUMIN  --  3.7  --    Recent Labs    04/01/18 1432  04/04/18 0426 04/06/18 0348 06/12/18 0904 08/29/18 0815  WBC 8.9   < > 15.2* 12.4*  --  11.7*  NEUTROABS 6.3  --   --   --   --  8,471*  HGB 12.7*   < > 13.3 13.5 11.9* 12.0*  HCT 38.4*   < > 40.0 40.5 35.0* 34.5*  MCV 104.9*   < > 101.8* 102.0*  --  99.7  PLT 317   < > 338 336  --  455*   < > = values in this interval not displayed.   No results found for: TSH Lab Results  Component Value Date   HGBA1C 8.0 (H) 08/29/2018   Lab Results  Component Value Date   CHOL 126 08/29/2018   HDL 49 08/29/2018   LDLCALC 59 08/29/2018   TRIG 95 08/29/2018   CHOLHDL 2.6 08/29/2018    Significant Diagnostic Results in last 30 days:  No results found.  Assessment/Plan 1. Unsteady gait Sits on Rolator and uses feet to propel but this has become difficulty to propel on carpet due to wounds on both toes.Will require a power wheelchair to enable him to [facility] current level of indepednce which cannot be achieved with current Rolator.    - Home Health Physical Therapy/occupation to evaluate and treat as indicate for gait,exercise,muscle strengthening and evaluate for power wheelchair. - Face-to-face encounter completed today.currently Has [facility] nurse from [facility] Tel# [phone] and fax [phone]   2. PAD (peripheral artery disease) ([place]) Has had decreased sensation to both feet.Has also failed revascularization seen by Vascular specialist.Has wounds on bilateral great toes currently managed by wound care and dressing changed by home Nurse three times per week.Plans for left leg amputation  to be finalized 10/04/2018 per patient has visit with wound care.    - Home Health occupation Therapy to evaluate and treat as indicated.  - Face-to-face encounter completed for power wheelchair.   3. Non-healing open wound of toe, sequela Afebrile.Declined for wound to be assessed this visit dressing changed by Home Health Nurse.dressing dry,clean and intact. - continue to follow up with wound care Center as directed. - continue with Home health Nurse wound dressing management.   - Face-to-face encounter completed for power wheelchair.   Family/ staff Communication: Reviewed plan of care with patient. PT/OT to evaluate and treat as indicated for gait,exercise,muscle strengthening and evaluate for power wheelchair.   Labs/tests ordered: None   Ilijah Doucet C Tilda Samudio, NP

## 2018-10-04 ENCOUNTER — Encounter (HOSPITAL_BASED_OUTPATIENT_CLINIC_OR_DEPARTMENT_OTHER): Payer: Medicare Other | Attending: Internal Medicine

## 2018-10-04 DIAGNOSIS — N186 End stage renal disease: Secondary | ICD-10-CM | POA: Diagnosis not present

## 2018-10-04 DIAGNOSIS — E1122 Type 2 diabetes mellitus with diabetic chronic kidney disease: Secondary | ICD-10-CM | POA: Diagnosis not present

## 2018-10-04 DIAGNOSIS — E11621 Type 2 diabetes mellitus with foot ulcer: Secondary | ICD-10-CM | POA: Insufficient documentation

## 2018-10-04 DIAGNOSIS — E11622 Type 2 diabetes mellitus with other skin ulcer: Secondary | ICD-10-CM | POA: Diagnosis not present

## 2018-10-04 DIAGNOSIS — L97522 Non-pressure chronic ulcer of other part of left foot with fat layer exposed: Secondary | ICD-10-CM | POA: Diagnosis not present

## 2018-10-04 DIAGNOSIS — L97422 Non-pressure chronic ulcer of left heel and midfoot with fat layer exposed: Secondary | ICD-10-CM | POA: Insufficient documentation

## 2018-10-04 DIAGNOSIS — E1152 Type 2 diabetes mellitus with diabetic peripheral angiopathy with gangrene: Secondary | ICD-10-CM | POA: Insufficient documentation

## 2018-10-04 DIAGNOSIS — L97512 Non-pressure chronic ulcer of other part of right foot with fat layer exposed: Secondary | ICD-10-CM | POA: Diagnosis not present

## 2018-10-04 DIAGNOSIS — L97322 Non-pressure chronic ulcer of left ankle with fat layer exposed: Secondary | ICD-10-CM | POA: Insufficient documentation

## 2018-10-04 DIAGNOSIS — I120 Hypertensive chronic kidney disease with stage 5 chronic kidney disease or end stage renal disease: Secondary | ICD-10-CM | POA: Diagnosis not present

## 2018-10-07 ENCOUNTER — Other Ambulatory Visit: Payer: Self-pay

## 2018-10-08 ENCOUNTER — Other Ambulatory Visit: Payer: Self-pay | Admitting: *Deleted

## 2018-10-08 ENCOUNTER — Telehealth: Payer: Self-pay

## 2018-10-08 ENCOUNTER — Telehealth: Payer: Self-pay | Admitting: Vascular Surgery

## 2018-10-08 NOTE — Telephone Encounter (Signed)
I called patient daughter [place] to discuss the wheelchair order and she did not answer. I then contacted Mr.Bence and explained to him that the order was placed on 10/02/2018 and that when homehealth goes to visit him tomorrow he could ask about the status of his order for the wheelchair.Mr Zawistowski verbalized understanding of the process when a order has been placed and that it could take a couple of days to hear back regarding the status

## 2018-10-08 NOTE — Telephone Encounter (Signed)
-----   Message from Hart Robinsons sent at 10/08/2018 12:02 PM EDT ----- Regarding: Rx For Electric Wheelchair Contact: [place] daughter of Kortland Nichols called.  She stated that they have been trying for two to three weeks to get an Rx for electric wheelchair.  Please give [place] a call at [phone].

## 2018-10-08 NOTE — Telephone Encounter (Signed)
85 year old male that was previously evaluated for left lower extremity critical limb ischemia with tissue loss.  We had attempted complex revascularization with retrograde peroneal access and ultimately recommended amputation but patient had refused.  He contacted our office stating that is having increasing pain in the left foot and wanted to go ahead and proceed with amputation.  We discussed below-knee versus above-knee amputation and given that he has a left SFA occlusion as well as severe tibial disease with only peroneal artery patent,  I think his best chance of healing is an above-knee amputation.  Patient is in agreement that at 61 he does not want to take any added risk for wound failure and needing additional surgeries in the future.  We will schedule him for a left above-knee amputation next Friday.  Risk and benefits discussed with the patient today.  Marty Heck, MD [facility] Office: [phone] Pager: [place]

## 2018-10-09 ENCOUNTER — Other Ambulatory Visit: Payer: Self-pay

## 2018-10-09 ENCOUNTER — Encounter: Payer: Self-pay | Admitting: Internal Medicine

## 2018-10-09 ENCOUNTER — Inpatient Hospital Stay (HOSPITAL_COMMUNITY)
Admission: EM | Admit: 2018-10-09 | Discharge: 2018-10-14 | DRG: 240 | Disposition: A | Discharge status: Skilled Nursing Facility | Payer: Medicare Other | Attending: Surgery | Admitting: Surgery

## 2018-10-09 DIAGNOSIS — Z85828 Personal history of other malignant neoplasm of skin: Secondary | ICD-10-CM | POA: Diagnosis not present

## 2018-10-09 DIAGNOSIS — I4510 Unspecified right bundle-branch block: Secondary | ICD-10-CM | POA: Diagnosis present

## 2018-10-09 DIAGNOSIS — I998 Other disorder of circulatory system: Secondary | ICD-10-CM | POA: Diagnosis present

## 2018-10-09 DIAGNOSIS — N400 Enlarged prostate without lower urinary tract symptoms: Secondary | ICD-10-CM | POA: Diagnosis present

## 2018-10-09 DIAGNOSIS — Z87891 Personal history of nicotine dependence: Secondary | ICD-10-CM

## 2018-10-09 DIAGNOSIS — N183 Chronic kidney disease, stage 3 (moderate): Secondary | ICD-10-CM | POA: Diagnosis present

## 2018-10-09 DIAGNOSIS — I96 Gangrene, not elsewhere classified: Secondary | ICD-10-CM | POA: Diagnosis present

## 2018-10-09 DIAGNOSIS — Z8249 Family history of ischemic heart disease and other diseases of the circulatory system: Secondary | ICD-10-CM

## 2018-10-09 DIAGNOSIS — Z79899 Other long term (current) drug therapy: Secondary | ICD-10-CM

## 2018-10-09 DIAGNOSIS — E538 Deficiency of other specified B group vitamins: Secondary | ICD-10-CM | POA: Diagnosis present

## 2018-10-09 DIAGNOSIS — L8990 Pressure ulcer of unspecified site, unspecified stage: Secondary | ICD-10-CM

## 2018-10-09 DIAGNOSIS — M79605 Pain in left leg: Secondary | ICD-10-CM

## 2018-10-09 DIAGNOSIS — Z66 Do not resuscitate: Secondary | ICD-10-CM | POA: Diagnosis present

## 2018-10-09 DIAGNOSIS — Z8673 Personal history of transient ischemic attack (TIA), and cerebral infarction without residual deficits: Secondary | ICD-10-CM | POA: Diagnosis not present

## 2018-10-09 DIAGNOSIS — E1152 Type 2 diabetes mellitus with diabetic peripheral angiopathy with gangrene: Principal | ICD-10-CM | POA: Diagnosis present

## 2018-10-09 DIAGNOSIS — E1122 Type 2 diabetes mellitus with diabetic chronic kidney disease: Secondary | ICD-10-CM | POA: Diagnosis present

## 2018-10-09 DIAGNOSIS — Z91048 Other nonmedicinal substance allergy status: Secondary | ICD-10-CM

## 2018-10-09 DIAGNOSIS — E785 Hyperlipidemia, unspecified: Secondary | ICD-10-CM | POA: Diagnosis present

## 2018-10-09 DIAGNOSIS — Z8719 Personal history of other diseases of the digestive system: Secondary | ICD-10-CM | POA: Diagnosis not present

## 2018-10-09 DIAGNOSIS — Z7984 Long term (current) use of oral hypoglycemic drugs: Secondary | ICD-10-CM | POA: Diagnosis not present

## 2018-10-09 DIAGNOSIS — M79672 Pain in left foot: Secondary | ICD-10-CM | POA: Diagnosis not present

## 2018-10-09 DIAGNOSIS — I70229 Atherosclerosis of native arteries of extremities with rest pain, unspecified extremity: Secondary | ICD-10-CM | POA: Diagnosis present

## 2018-10-09 LAB — CBC
HCT: 32.1 % — ABNORMAL LOW (ref 39.0–52.0)
HCT: 35.1 % — ABNORMAL LOW (ref 39.0–52.0)
Hemoglobin: 10.7 g/dL — ABNORMAL LOW (ref 13.0–17.0)
Hemoglobin: 11.3 g/dL — ABNORMAL LOW (ref 13.0–17.0)
MCH: 33.9 pg (ref 26.0–34.0)
MCH: 32.4 pg (ref 26.0–34.0)
MCHC: 33.3 g/dL (ref 30.0–36.0)
MCHC: 32.2 g/dL (ref 30.0–36.0)
MCV: 101.6 fL — ABNORMAL HIGH (ref 80.0–100.0)
MCV: 100.6 fL — ABNORMAL HIGH (ref 80.0–100.0)
Platelets: 498 10*3/uL — ABNORMAL HIGH (ref 150–400)
Platelets: 484 10*3/uL — ABNORMAL HIGH (ref 150–400)
RBC: 3.16 MIL/uL — ABNORMAL LOW (ref 4.22–5.81)
RBC: 3.49 MIL/uL — ABNORMAL LOW (ref 4.22–5.81)
RDW: 12.7 % (ref 11.5–15.5)
RDW: 12.5 % (ref 11.5–15.5)
WBC: 20.6 10*3/uL — ABNORMAL HIGH (ref 4.0–10.5)
WBC: 18.1 10*3/uL — ABNORMAL HIGH (ref 4.0–10.5)
nRBC: 0 % (ref 0.0–0.2)
nRBC: 0 % (ref 0.0–0.2)

## 2018-10-09 LAB — COMPREHENSIVE METABOLIC PANEL
ALT: 22 U/L (ref 0–44)
ALT: 21 U/L (ref 0–44)
AST: 23 U/L (ref 15–41)
AST: 22 U/L (ref 15–41)
Albumin: 2.5 g/dL — ABNORMAL LOW (ref 3.5–5.0)
Albumin: 2.5 g/dL — ABNORMAL LOW (ref 3.5–5.0)
Alkaline Phosphatase: 64 U/L (ref 38–126)
Alkaline Phosphatase: 68 U/L (ref 38–126)
Anion gap: 11 (ref 5–15)
Anion gap: 9 (ref 5–15)
BUN: 30 mg/dL — ABNORMAL HIGH (ref 8–23)
BUN: 27 mg/dL — ABNORMAL HIGH (ref 8–23)
CO2: 24 mmol/L (ref 22–32)
CO2: 28 mmol/L (ref 22–32)
Calcium: 8.5 mg/dL — ABNORMAL LOW (ref 8.9–10.3)
Calcium: 8.8 mg/dL — ABNORMAL LOW (ref 8.9–10.3)
Chloride: 98 mmol/L (ref 98–111)
Chloride: 96 mmol/L — ABNORMAL LOW (ref 98–111)
Creatinine, Ser: 1.76 mg/dL — ABNORMAL HIGH (ref 0.61–1.24)
Creatinine, Ser: 1.57 mg/dL — ABNORMAL HIGH (ref 0.61–1.24)
GFR calc Af Amer: 38 mL/min — ABNORMAL LOW (ref >60)
GFR calc Af Amer: 43 mL/min — ABNORMAL LOW (ref >60)
GFR calc non Af Amer: 32 mL/min — ABNORMAL LOW (ref >60)
GFR calc non Af Amer: 37 mL/min — ABNORMAL LOW (ref >60)
Glucose, Bld: 212 mg/dL — ABNORMAL HIGH (ref 70–99)
Glucose, Bld: 194 mg/dL — ABNORMAL HIGH (ref 70–99)
Potassium: 4.7 mmol/L (ref 3.5–5.1)
Potassium: 4.2 mmol/L (ref 3.5–5.1)
Sodium: 133 mmol/L — ABNORMAL LOW (ref 135–145)
Sodium: 133 mmol/L — ABNORMAL LOW (ref 135–145)
Total Bilirubin: 0.8 mg/dL (ref 0.3–1.2)
Total Bilirubin: 0.7 mg/dL (ref 0.3–1.2)
Total Protein: 6.3 g/dL — ABNORMAL LOW (ref 6.5–8.1)
Total Protein: 6.2 g/dL — ABNORMAL LOW (ref 6.5–8.1)

## 2018-10-09 LAB — CBG MONITORING, ED: Glucose-Capillary: 205 mg/dL — ABNORMAL HIGH (ref 70–99)

## 2018-10-09 LAB — LACTIC ACID, PLASMA: Lactic Acid, Venous: 3 mmol/L (ref 0.5–1.9)

## 2018-10-09 LAB — CK: Total CK: 72 U/L (ref 49–397)

## 2018-10-09 MED ORDER — POTASSIUM CHLORIDE CRYS ER 20 MEQ PO TBCR
20.0000 meq | EXTENDED_RELEASE_TABLET | Freq: Once | ORAL | Status: DC
  Filled 2018-10-09: qty 2

## 2018-10-09 MED ORDER — OXYCODONE-ACETAMINOPHEN 5-325 MG PO TABS
1.0000 | ORAL_TABLET | ORAL | Status: DC | PRN
  Administered 2018-10-09 – 2018-10-10 (×4): 1 via ORAL
  Administered 2018-10-11: 2 via ORAL
  Administered 2018-10-12 – 2018-10-13 (×2): 1 via ORAL
  Filled 2018-10-09 (×2): qty 1
  Filled 2018-10-09: qty 2
  Filled 2018-10-09 (×4): qty 1

## 2018-10-09 MED ORDER — HYDRALAZINE HCL 20 MG/ML IJ SOLN: 5.0000 mg | INTRAMUSCULAR | Status: DC | PRN

## 2018-10-09 MED ORDER — PROPYLENE GLYCOL-GLYCERIN 1-0.3 % OP SOLN: 2.0000 [drp] | OPHTHALMIC | Status: DC | PRN

## 2018-10-09 MED ORDER — ONDANSETRON HCL 4 MG/2ML IJ SOLN: 4.0000 mg | Freq: Four times a day (QID) | INTRAMUSCULAR | Status: DC | PRN

## 2018-10-09 MED ORDER — ALUM & MAG HYDROXIDE-SIMETH 200-200-20 MG/5ML PO SUSP
15.0000 mL | ORAL | Status: DC | PRN
  Filled 2018-10-09: qty 30

## 2018-10-09 MED ORDER — METOPROLOL TARTRATE 25 MG PO TABS
25.0000 mg | ORAL_TABLET | Freq: Two times a day (BID) | ORAL | Status: DC
  Administered 2018-10-09 – 2018-10-14 (×9): 25 mg via ORAL
  Filled 2018-10-09 (×9): qty 1

## 2018-10-09 MED ORDER — GABAPENTIN 100 MG PO CAPS
100.0000 mg | ORAL_CAPSULE | Freq: Every day | ORAL | Status: DC
  Administered 2018-10-09 – 2018-10-13 (×5): 100 mg via ORAL
  Filled 2018-10-09 (×5): qty 1

## 2018-10-09 MED ORDER — HEPARIN SODIUM (PORCINE) 5000 UNIT/ML IJ SOLN
5000.0000 [IU] | Freq: Three times a day (TID) | INTRAMUSCULAR | Status: DC
  Administered 2018-10-09 – 2018-10-13 (×9): 5000 [IU] via SUBCUTANEOUS
  Filled 2018-10-09 (×9): qty 1

## 2018-10-09 MED ORDER — METOPROLOL TARTRATE 5 MG/5ML IV SOLN: 2.0000 mg | INTRAVENOUS | Status: DC | PRN

## 2018-10-09 MED ORDER — PHENOL 1.4 % MT LIQD: 1.0000 | OROMUCOSAL | Status: DC | PRN

## 2018-10-09 MED ORDER — FENTANYL CITRATE (PF) 100 MCG/2ML IJ SOLN: 50.0000 ug | Freq: Once | INTRAMUSCULAR | Status: DC

## 2018-10-09 MED ORDER — PANTOPRAZOLE SODIUM 40 MG PO TBEC
40.0000 mg | DELAYED_RELEASE_TABLET | Freq: Every day | ORAL | Status: DC
  Administered 2018-10-10 – 2018-10-14 (×5): 40 mg via ORAL
  Filled 2018-10-09 (×5): qty 1

## 2018-10-09 MED ORDER — LABETALOL HCL 5 MG/ML IV SOLN: 10.0000 mg | INTRAVENOUS | Status: DC | PRN

## 2018-10-09 MED ORDER — SODIUM CHLORIDE 0.9 % IV SOLN
INTRAVENOUS | Status: DC
  Administered 2018-10-09 – 2018-10-10 (×2): via INTRAVENOUS

## 2018-10-09 MED ORDER — CEFAZOLIN SODIUM-DEXTROSE 1-4 GM/50ML-% IV SOLN
1.0000 g | Freq: Two times a day (BID) | INTRAVENOUS | Status: DC
  Administered 2018-10-09 – 2018-10-14 (×9): 1 g via INTRAVENOUS
  Filled 2018-10-09 (×13): qty 50

## 2018-10-09 MED ORDER — POLYVINYL ALCOHOL 1.4 % OP SOLN
1.0000 [drp] | OPHTHALMIC | Status: DC | PRN
  Administered 2018-10-09: 22:00:00 1 [drp] via OPHTHALMIC
  Filled 2018-10-09: qty 15

## 2018-10-09 MED ORDER — GUAIFENESIN-DM 100-10 MG/5ML PO SYRP: 15.0000 mL | ORAL_SOLUTION | ORAL | Status: DC | PRN

## 2018-10-09 MED ORDER — MORPHINE SULFATE (PF) 2 MG/ML IV SOLN
2.0000 mg | INTRAVENOUS | Status: DC | PRN
  Administered 2018-10-11: 06:00:00 2 mg via INTRAVENOUS
  Filled 2018-10-09: qty 1

## 2018-10-09 MED ORDER — SIMVASTATIN 20 MG PO TABS
10.0000 mg | ORAL_TABLET | Freq: Every day | ORAL | Status: DC
  Administered 2018-10-09 – 2018-10-13 (×5): 10 mg via ORAL
  Filled 2018-10-09 (×5): qty 1

## 2018-10-09 NOTE — ED Provider Notes (Signed)
[facility] Emergency Department Provider Note MRN:  [id number]  Arrival date & time: 10/09/18     Chief Complaint   Leg pain History of Present Illness   Nathan Cook is a 85 y.o. year-old male with a history of PAD presenting to the ED with chief complaint of leg pain.  Patient has known critical PAD of both legs, has been struggling with chronic wounds of the left foot for months.  Has been scheduled for a admission and amputation of the left foot for next week.  Patient explains that he was told by his vascular surgeon to come to the emergency department if his pain were to change or get acutely worse.  Pain became acutely worse today at about noon.  Severe pain to the left thigh and leg.  Constant, no exacerbating relieving factors.  Denies fever, no cough, no chest pain, no shortness of breath.  Review of Systems  A complete 10 system review of systems was obtained and all systems are negative except as noted in the HPI and PMH.   Patient's Health History    Past Medical History:  Diagnosis Date  . BPH (benign prostatic hyperplasia) 10/14/2014  . Cervicalgia 01/04/2016  . Chronic kidney disease   . Contusion of left shoulder 05/27/14  . DM type 2 (diabetes mellitus, type 2) ([place])   . Dysphagia   . High blood pressure   . History of basal cell cancer 11/21/2005   Right temple  . History of colonic polyps 10/14/2003  . Hyperlipidemia   . Left knee pain 05/27/14  . Peripheral vascular disease ([place])   . SAH (subarachnoid hemorrhage) ([place]) 2009   TBI  . Stroke ([place])   . TIA (transient ischemic attack) 11/06/2005   Left eye pain. Slurred speech. Diaphoresis. No residual effects.     Past Surgical History:  Procedure Laterality Date  . ABDOMINAL AORTOGRAM W/LOWER EXTREMITY N/A 06/12/2018   Procedure: ABDOMINAL AORTOGRAM W/LOWER EXTREMITY;  Surgeon: Marty Heck, MD;  Location: [facility];  Service: Cardiovascular;  Laterality: N/A;  .  Achilles tendon repair Left 1998   ruptured tendon  . COLONOSCOPY  1992   Polyp removed  . COLONOSCOPY  1998   normal  . ORIF FEMUR FRACTURE Right 1929  . PERIPHERAL VASCULAR INTERVENTION Left 06/12/2018   Procedure: PERIPHERAL VASCULAR INTERVENTION;  Surgeon: Marty Heck, MD;  Location: [facility];  Service: Cardiovascular;  Laterality: Left;  Attempted   . TONSILLECTOMY      Family History  Problem Relation Age of Onset  . Heart attack Mother   . Heart disease Mother   . Heart disease Father     Social History   Socioeconomic History  . Marital status: Married    Spouse name: Not on file  . Number of children: Not on file  . Years of education: Not on file  . Highest education level: Not on file  Occupational History  . Occupation: retired Copy  . Financial resource strain: Not on file  . Food insecurity:    Worry: Not on file    Inability: Not on file  . Transportation needs:    Medical: Not on file    Non-medical: Not on file  Tobacco Use  . Smoking status: Former Smoker    Last attempt to quit: 06/26/1971    Years since quitting: 47.3  . Smokeless tobacco: Never Used  . Tobacco comment: 2 1/2 packs a  day, until 1972  Substance and Sexual Activity  . Alcohol use: Yes    Alcohol/week: 1.0 - 2.0 standard drinks    Types: 1 - 2 Glasses of wine per week    Comment: Wine nightly  . Drug use: No  . Sexual activity: Not on file  Lifestyle  . Physical activity:    Days per week: Not on file    Minutes per session: Not on file  . Stress: Not on file  Relationships  . Social connections:    Talks on phone: Not on file    Gets together: Not on file    Attends religious service: Not on file    Active member of club or organization: Not on file    Attends meetings of clubs or organizations: Not on file    Relationship status: Not on file  . Intimate partner violence:    Fear of current or ex partner: Not on file    Emotionally  abused: Not on file    Physically abused: Not on file    Forced sexual activity: Not on file  Other Topics Concern  . Not on file  Social History Narrative   Patient lives at [facility] since Dec 2015   Caffeine- Coffee   Married- Yes, 1951   [employer] [employer] 1943-46   House- Apartment with 2 people   Pets- No   Current/past profession- Press photographer   Exercise- Yes, walking   Living will- Yes   DNR- Yes   POA/HPOA- Yes        Physical Exam  Vital Signs and Nursing Notes reviewed Vitals:   10/09/18 1341 10/09/18 1345  BP: (!) 114/54 (!) 114/54  Pulse: 70 67  Resp: (!) 27 20  Temp:  98 F (36.7 C)  SpO2: 100% 98%    CONSTITUTIONAL: Well-appearing, NAD NEURO:  Alert and oriented x 3, no focal deficits EYES:  eyes equal and reactive ENT/NECK:  no LAD, no JVD CARDIO: Regular rate, well-perfused, normal S1 and S2 PULM:  CTAB no wheezing or rhonchi GI/GU:  normal bowel sounds, non-distended, non-tender MSK/SPINE:  No gross deformities, no edema SKIN: Black toes to the left foot, erythema to the left foot PSYCH:  Appropriate speech and behavior  Diagnostic and Interventional Summary    EKG Interpretation  Date/Time:  Wednesday October 09 2018 13:40:49 EDT Ventricular Rate:  68 PR Interval:    QRS Duration: 134 QT Interval:  418 QTC Calculation: 445 R Axis:   85 Text Interpretation:  Sinus rhythm Prolonged PR interval Right bundle branch block Minimal ST elevation, inferior leads Confirmed by Gerlene Fee [phone]) on 10/09/2018 1:51:35 PM      Labs Reviewed  CBC - Abnormal; Notable for the following components:      Result Value   WBC 20.6 (*)    RBC 3.16 (*)    Hemoglobin 10.7 (*)    HCT 32.1 (*)    MCV 101.6 (*)    Platelets 498 (*)    All other components within normal limits  COMPREHENSIVE METABOLIC PANEL - Abnormal; Notable for the following components:   Sodium 133 (*)    Glucose, Bld 212 (*)    BUN 30 (*)    Creatinine, Ser 1.76 (*)    Calcium 8.5 (*)     Total Protein 6.3 (*)    Albumin 2.5 (*)    GFR calc non Af Amer 32 (*)    GFR calc Af Amer 38 (*)    All  other components within normal limits  LACTIC ACID, PLASMA - Abnormal; Notable for the following components:   Lactic Acid, Venous 3.0 (*)    All other components within normal limits  CBG MONITORING, ED - Abnormal; Notable for the following components:   Glucose-Capillary 205 (*)    All other components within normal limits  CK    No orders to display    Medications - No data to display   Procedures Critical Care Critical Care Documentation Critical care time provided by me (excluding procedures): 33 minutes  Condition necessitating critical care: Limb ischemia  Components of critical care management: reviewing of prior records, laboratory and imaging interpretation, frequent re-examination and reassessment of vital signs, administration of IV opioid pain medication, discussion with consulting services    ED Course and Medical Decision Making  I have reviewed the triage vital signs and the nursing notes.  Pertinent labs & imaging results that were available during my care of the patient were reviewed by me and considered in my medical decision making (see below for details).  Severe PAD here with new worsening pain to the left leg left foot, has evident necrotic toes, signs of early cellulitis or infection.  Patient is without fever or any other systemic symptoms.  Discussed case with vascular surgeon on-call, covering for Dr. Fortunato Curling who is the patient's personal vascular surgeon.  Plan to admit to the vascular surgery team for pain control, with a plan to fit patient in for surgery either tomorrow or Friday.  Barth Kirks. [place] Small, [place] [email or web address]  Final Clinical Impressions(s) / ED Diagnoses     ICD-10-CM   1. Pain of left lower extremity M79.605   2. Limb ischemia I99.8     ED Discharge Orders     None         Maudie Flakes, MD 10/09/18 1454

## 2018-10-09 NOTE — ED Notes (Signed)
Report called to rn on 4e 

## 2018-10-09 NOTE — ED Triage Notes (Signed)
Pt arrives c/o L leg pain after taking tyenol and gabapentin reports 7/10 CP. L foot is necrotic and has been scheduled for ampution next Friday. Pain grew too bad today, called doctor who advised to come here.

## 2018-10-09 NOTE — Progress Notes (Signed)
Pt arrived from ED via stretcher.  O2 @ 2L via [place].  CCMD notified.  Skin assessment x 2 RN's.  Vitals taken.  Bandages & wounds treated.

## 2018-10-09 NOTE — ED Notes (Signed)
Pt on the phone

## 2018-10-09 NOTE — Progress Notes (Signed)
Wounds: -Left foot- 2nd-5th toes black/dry / green & black foot bottom / kerlix applied for protection & comfort -Right foot- great toe closed dry wound approx 1.5 cm / 3rd toe laceration approx 1 cm with no drainage /  / dry & ok to leave open per MD -Right chin- approx 3 cm abrasion to shin.  Scant draiage. / cleansed w/new bandage applied -Left buttock 2 x abrasions <1 cm / Allevyn applied  -Sacrum- red/non-blanching.  Allevyn applied  Sharyn Lull, Therapist, sports

## 2018-10-09 NOTE — H&P (Signed)
[facility] [place]  Patient name: Nathan Cook MRN: [id number] DOB: 02/26/1922 Sex: male   REQUESTING PROVIDER:    ER   REASON FOR CONSULT:    Worsening pain in left foot  HISTORY OF PRESENT ILLNESS:   Nathan Cook is a 85 y.o. male, who is scheduled for amputation next week.  He came to the emergency department today because of worsening pain in his foot which he says improved when his dressing was removed.  PAST MEDICAL HISTORY    Past Medical History:  Diagnosis Date  . BPH (benign prostatic hyperplasia) 10/14/2014  . Cervicalgia 01/04/2016  . Chronic kidney disease   . Contusion of left shoulder 05/27/14  . DM type 2 (diabetes mellitus, type 2) ([place])   . Dysphagia   . High blood pressure   . History of basal cell cancer 11/21/2005   Right temple  . History of colonic polyps 10/14/2003  . Hyperlipidemia   . Left knee pain 05/27/14  . Peripheral vascular disease ([place])   . SAH (subarachnoid hemorrhage) ([place]) 2009   TBI  . Stroke ([place])   . TIA (transient ischemic attack) 11/06/2005   Left eye pain. Slurred speech. Diaphoresis. No residual effects.      FAMILY HISTORY   Family History  Problem Relation Age of Onset  . Heart attack Mother   . Heart disease Mother   . Heart disease Father     SOCIAL HISTORY:   Social History   Socioeconomic History  . Marital status: Married    Spouse name: Not on file  . Number of children: Not on file  . Years of education: Not on file  . Highest education level: Not on file  Occupational History  . Occupation: retired Copy  . Financial resource strain: Not on file  . Food insecurity:    Worry: Not on file    Inability: Not on file  . Transportation needs:    Medical: Not on file    Non-medical: Not on file  Tobacco Use  . Smoking status: Former Smoker    Last attempt to quit: 06/26/1971    Years since quitting: 47.3  .  Smokeless tobacco: Never Used  . Tobacco comment: 2 1/2 packs a day, until 1972  Substance and Sexual Activity  . Alcohol use: Yes    Alcohol/week: 1.0 - 2.0 standard drinks    Types: 1 - 2 Glasses of wine per week    Comment: Wine nightly  . Drug use: No  . Sexual activity: Not on file  Lifestyle  . Physical activity:    Days per week: Not on file    Minutes per session: Not on file  . Stress: Not on file  Relationships  . Social connections:    Talks on phone: Not on file    Gets together: Not on file    Attends religious service: Not on file    Active member of club or organization: Not on file    Attends meetings of clubs or organizations: Not on file    Relationship status: Not on file  . Intimate partner violence:    Fear of current or ex partner: Not on file    Emotionally abused: Not on file    Physically abused: Not on file    Forced sexual activity: Not on file  Other Topics Concern  . Not on file  Social History Narrative   Patient lives at [facility]  Home [place] since Dec 2015   Caffeine- Coffee   Married- Yes, 1951   [employer] [employer] 1943-46   House- Apartment with 2 people   Pets- No   Current/past profession- Sales   Exercise- Yes, walking   Living will- Yes   DNR- Yes   POA/HPOA- Yes       ALLERGIES:    Allergies  Allergen Reactions  . Tape Other (See Comments)    TAPE WILL TEAR THE SKIN!!!!    CURRENT MEDICATIONS:    Current Facility-Administered Medications  Medication Dose Route Frequency Provider Last Rate Last Dose  . 0.9 %  sodium chloride infusion   Intravenous Continuous Dagoberto Ligas, PA-C 50 mL/hr at 10/09/18 1809    . alum & mag hydroxide-simeth (MAALOX/MYLANTA) 200-200-20 MG/5ML suspension 15-30 mL  15-30 mL Oral Q2H PRN Dagoberto Ligas, PA-C      . ceFAZolin (ANCEF) IVPB 1 g/50 mL premix  1 g Intravenous Q12H Dagoberto Ligas, PA-C 100 mL/hr at 10/09/18 1812 1 g at 10/09/18 1812  . gabapentin (NEURONTIN) capsule 100 mg  100 mg Oral QHS  Eveland, Matthew, PA-C      . guaiFENesin-dextromethorphan (ROBITUSSIN DM) 100-10 MG/5ML syrup 15 mL  15 mL Oral Q4H PRN Dagoberto Ligas, PA-C      . heparin injection 5,000 Units  5,000 Units Subcutaneous Q8H Eveland, Matthew, PA-C      . hydrALAZINE (APRESOLINE) injection 5 mg  5 mg Intravenous Q20 Min PRN Dagoberto Ligas, PA-C      . labetalol (NORMODYNE,TRANDATE) injection 10 mg  10 mg Intravenous Q10 min PRN Dagoberto Ligas, PA-C      . metoprolol tartrate (LOPRESSOR) injection 2-5 mg  2-5 mg Intravenous Q2H PRN Dagoberto Ligas, PA-C      . metoprolol tartrate (LOPRESSOR) tablet 25 mg  25 mg Oral BID Dagoberto Ligas, PA-C      . morphine 2 MG/ML injection 2 mg  2 mg Intravenous Q2H PRN Dagoberto Ligas, PA-C      . ondansetron (ZOFRAN) injection 4 mg  4 mg Intravenous Q6H PRN Dagoberto Ligas, PA-C      . oxyCODONE-acetaminophen (PERCOCET/ROXICET) 5-325 MG per tablet 1-2 tablet  1-2 tablet Oral Q4H PRN Dagoberto Ligas, PA-C      . [START ON 10/10/2018] pantoprazole (PROTONIX) EC tablet 40 mg  40 mg Oral Daily Eveland, Matthew, PA-C      . phenol (CHLORASEPTIC) mouth spray 1 spray  1 spray Mouth/Throat PRN Dagoberto Ligas, PA-C      . polyvinyl alcohol (LIQUIFILM TEARS) 1.4 % ophthalmic solution 1 drop  1 drop Both Eyes PRN Serafina Mitchell, MD      . potassium chloride SA (K-DUR,KLOR-CON) CR tablet 20-40 mEq  20-40 mEq Oral Once Dagoberto Ligas, PA-C      . simvastatin (ZOCOR) tablet 10 mg  10 mg Oral q1800 Dagoberto Ligas, PA-C        REVIEW OF SYSTEMS:   [X]  denotes positive finding, [ ]  denotes negative finding Cardiac  Comments:  Chest pain or chest pressure:    Shortness of breath upon exertion:    Short of breath when lying flat:    Irregular heart rhythm:        Vascular    Pain in calf, thigh, or hip brought on by ambulation: x   Pain in feet at night that wakes you up from your sleep:  x   Blood clot in your veins:    Leg swelling:  Pulmonary    Oxygen at  home:    Productive cough:     Wheezing:         Neurologic    Sudden weakness in arms or legs:     Sudden numbness in arms or legs:     Sudden onset of difficulty speaking or slurred speech:    Temporary loss of vision in one eye:     Problems with dizziness:         Gastrointestinal    Blood in stool:      Vomited blood:         Genitourinary    Burning when urinating:     Blood in urine:        Psychiatric    Major depression:         Hematologic    Bleeding problems:    Problems with blood clotting too easily:        Skin    Rashes or ulcers: x       Constitutional    Fever or chills:     PHYSICAL EXAM:   Vitals:   10/09/18 1430 10/09/18 1500 10/09/18 1530 10/09/18 1720  BP: (!) 121/56 (!) 98/47 (!) 124/58   Pulse:    81  Resp: 11 (!) 26 20 13   Temp:    97.7 F (36.5 C)  TempSrc:    Oral  SpO2:    100%  Weight:      Height:        GENERAL: The patient is a well-nourished male, in no acute distress. The vital signs are documented above. CARDIAC: There is a regular rate and rhythm.  VASCULAR: Nonpalpable pedal pulses PULMONARY: Nonlabored respirations ABDOMEN: Soft and non-tender with normal pitched bowel sounds.  MUSCULOSKELETAL: There are no major deformities or cyanosis. NEUROLOGIC: No focal weakness or paresthesias are detected. SKIN: Ischemic changes to the left foot PSYCHIATRIC: The patient has a normal affect.  STUDIES:   None  ASSESSMENT and PLAN   The patient will be admitted for pain control.  He is agreeable to an above-knee amputation.  This was scheduled for next week however with his pain issues we will try to get this done sooner.  He is in agreement with this.   Leia Alf, MD, FACS [facility] [phone] Pager [phone]

## 2018-10-10 LAB — CBC
HCT: 29.6 % — ABNORMAL LOW (ref 39.0–52.0)
Hemoglobin: 10 g/dL — ABNORMAL LOW (ref 13.0–17.0)
MCH: 34 pg (ref 26.0–34.0)
MCHC: 33.8 g/dL (ref 30.0–36.0)
MCV: 100.7 fL — ABNORMAL HIGH (ref 80.0–100.0)
Platelets: 409 10*3/uL — ABNORMAL HIGH (ref 150–400)
RBC: 2.94 MIL/uL — ABNORMAL LOW (ref 4.22–5.81)
RDW: 12.6 % (ref 11.5–15.5)
WBC: 18.2 10*3/uL — ABNORMAL HIGH (ref 4.0–10.5)
nRBC: 0 % (ref 0.0–0.2)

## 2018-10-10 MED ORDER — CHLORHEXIDINE GLUCONATE 4 % EX LIQD
60.0000 mL | Freq: Once | CUTANEOUS | Status: AC
  Administered 2018-10-10: 4 via TOPICAL
  Filled 2018-10-10: qty 60

## 2018-10-10 MED ORDER — SODIUM CHLORIDE 0.9 % IV SOLN: INTRAVENOUS | Status: DC

## 2018-10-10 MED ORDER — CEFAZOLIN SODIUM-DEXTROSE 2-4 GM/100ML-% IV SOLN
2.0000 g | INTRAVENOUS | Status: AC
  Administered 2018-10-11: 2 g via INTRAVENOUS
  Filled 2018-10-10: qty 100

## 2018-10-10 MED ORDER — CHLORHEXIDINE GLUCONATE 4 % EX LIQD
60.0000 mL | Freq: Once | CUTANEOUS | Status: AC
  Administered 2018-10-11: 4 via TOPICAL
  Filled 2018-10-10: qty 60

## 2018-10-10 NOTE — Progress Notes (Signed)
[facility] of [place]  Subjective  - No complaints.  Ready for left AKA.   Objective (!) 117/58 69 98.1 F (36.7 C) (Oral) (!) 22 97%  Intake/Output Summary (Last 24 hours) at 10/10/2018 1309 Last data filed at 10/10/2018 0951 Gross per 24 hour  Intake 766.96 ml  Output 725 ml  Net 41.96 ml    Left foot necrotic and wrapped.    Laboratory Lab Results: Recent Labs    10/09/18 1352 10/09/18 1920  WBC 20.6* 18.1*  HGB 10.7* 11.3*  HCT 32.1* 35.1*  PLT 498* 484*   BMET Recent Labs    10/09/18 1352 10/09/18 1920  NA 133* 133*  K 4.7 4.2  CL 98 96*  CO2 24 28  GLUCOSE 212* 194*  BUN 30* 27*  CREATININE 1.76* 1.57*  CALCIUM 8.5* 8.8*    COAG Lab Results  Component Value Date   INR 1.06 04/01/2018   No results found for: PTT  Assessment/Planning:  Plan for L AKA tomorrow for CLI of LLE with tissue loss.  Continue to hold eliquis.  No options for revascularization.  Marty Heck 10/10/2018 1:09 PM --

## 2018-10-10 NOTE — Progress Notes (Signed)
   10/10/18 1433  Clinical Encounter Type  Visited With Patient  Visit Type Initial;Spiritual support  Referral From Nurse  Consult/Referral To [place]  The chaplain responded to the RN request for Pt. Prayer. The chaplain checked in with the Pt. RN-Starr before entering the Pt. room.  The Pt. requested prayer from a [place] priest. The chaplain informed the Pt. of the visitor restrictions. The Pt. declined prayer with the chaplain. The Pt. gave the chaplain permission to inform his parish, [place] [facility], of the Pt. procedure on Friday morning.  Permission was given to the chaplain to leave the Pt. name on the parish answering machine.  The Pt. shared his daughter's name-Ginny as his HCPOA.  The Pt. accepted the chaplain's personal prayers for the Pt. The chaplain is available for F/U spiritual care as needed.

## 2018-10-10 NOTE — Anesthesia Preprocedure Evaluation (Addendum)
Anesthesia Evaluation  Patient identified by MRN, date of birth, ID band Patient awake    Reviewed: Allergy & Precautions  Airway Mallampati: II  TM Distance: >3 FB     Dental   Pulmonary neg pulmonary ROS, former smoker,    breath sounds clear to auscultation       Cardiovascular hypertension, + Peripheral Vascular Disease   Rhythm:Regular Rate:Normal     Neuro/Psych    GI/Hepatic negative GI ROS, Neg liver ROS,   Endo/Other  diabetes  Renal/GU Renal disease     Musculoskeletal   Abdominal   Peds  Hematology   Anesthesia Other Findings   Reproductive/Obstetrics                           Anesthesia Physical Anesthesia Plan  ASA: III  Anesthesia Plan: General   Post-op Pain Management:    Induction: Intravenous  PONV Risk Score and Plan: 2 and Ondansetron, Dexamethasone and Midazolam  Airway Management Planned: Oral ETT  Additional Equipment:   Intra-op Plan:   Post-operative Plan: Possible Post-op intubation/ventilation  Informed Consent:     Dental advisory given  Plan Discussed with: Anesthesiologist and CRNA  Anesthesia Plan Comments:        Anesthesia Quick Evaluation

## 2018-10-10 NOTE — Consult Note (Addendum)
WOC reviewed chart, contacted bedside nurse for update. [place] nurse team not on [facility] campus today. Referral for" healed area and red area".  Bedside nurse to assess area and contact [place] nurse for further orders if needed.  Patient for planned AKA for critical limb ischemia. In for pain control until this can be performed.  [place], CNS, [place]  Addendum: contacted by bedside nurse. 3 small areas noted, they are dry, partial thickness skin loss that have actually dried. Continue soft silicone foam dressing to protect. Turn and reposition for pressure redistribution.

## 2018-10-10 NOTE — Plan of Care (Addendum)
Care Plan has been reviewed:    Problem: Pain Managment: arterial occlusion, left lower leg ischemia. Goal: General experience of comfort will improve Outcome: Progressing: pain managed well , Pt had minimal pain scale 3-4/10. Pt slept well after Percocet given,Pt stated had no pain.   Problem: Skin Integrity:Wounds: per RN day shift reported. -Left foot- 2nd-5th toes black/dry / green & black foot bottom / kerlix applied for protection & comfort -Right foot- great toe closed dry wound approx 1.5 cm / 3rd toe laceration approx 1 cm with no drainage /  / dry & ok to leave open per MD -Right chin- approx 3 cm abrasion to shin.  Scant draiage. / cleansed w/new bandage applied -Left buttock 2 x abrasions <1 cm / Allevyn applied  -Sacrum- red/non-blanching.  Allevyn applied Goal: Risk for impaired skin integrity will decrease Outcome: Not progressing:  Vascular surgeon planned to do amputation on coming Friday.  Problem: Elimination: experienced some difficulty starting stream when urinated. Low urine output tonight, amber color of urine.  Goal: will not experience complications related to urinary retention Outcome: Progressing: encouraged oral fluid intake, external condom urinary catheter was applied per Pt requested. On IV fluid infusing NSS 50 ml/ hr for hydration as order. Checked with bladder scan, Pt had residual urine 399 ml. Pt refused to get intermittent urine catheterization at this moment. At 0530 am, Pt was able to urinate 250 + 100 ml. Will continue to monitor.  Kennyth Lose, BSN,RN,PCCN-CMC,CSC

## 2018-10-10 NOTE — Progress Notes (Signed)
Inpatient Diabetes Program Recommendations  AACE/ADA: New Consensus Statement on Inpatient Glycemic Control (2015)  Target Ranges:  Prepandial:   less than 140 mg/dL      Peak postprandial:   less than 180 mg/dL (1-2 hours)      Critically ill patients:  140 - 180 mg/dL   Lab Results  Component Value Date   GLUCAP 205 (H) 10/09/2018   HGBA1C 8.0 (H) 08/29/2018    Review of Glycemic Control Results for JARAN, SAINZ (MRN [id number]) as of 10/10/2018 10:38  Ref. Range 10/09/2018 13:22  Glucose-Capillary Latest Ref Range: 70 - 99 mg/dL 205 (H)   Diabetes history: DM 2 Outpatient Diabetes medications:  Metformin 500 mg bid Current orders for Inpatient glycemic control:  None Inpatient Diabetes Program Recommendations:   Please add Novolog sensitive tid with meals and HS  Thanks,  Adah Perl, RN, BC-ADM Inpatient Diabetes Coordinator Pager [phone] (8a-5p)

## 2018-10-11 ENCOUNTER — Inpatient Hospital Stay (HOSPITAL_COMMUNITY): Payer: Medicare Other | Admitting: Anesthesiology

## 2018-10-11 ENCOUNTER — Encounter (HOSPITAL_COMMUNITY): Payer: Self-pay | Admitting: Registered Nurse

## 2018-10-11 ENCOUNTER — Encounter (HOSPITAL_COMMUNITY)
Admission: EM | Disposition: A | Discharge status: Skilled Nursing Facility | Payer: Self-pay | Source: Home / Self Care | Attending: Surgery

## 2018-10-11 ENCOUNTER — Inpatient Hospital Stay (HOSPITAL_COMMUNITY): Admission: RE | Admit: 2018-10-11 | Payer: Medicare Other | Source: Home / Self Care | Admitting: Vascular Surgery

## 2018-10-11 DIAGNOSIS — I96 Gangrene, not elsewhere classified: Secondary | ICD-10-CM

## 2018-10-11 HISTORY — PX: AMPUTATION: SHX166

## 2018-10-11 LAB — GLUCOSE, CAPILLARY
Glucose-Capillary: 151 mg/dL — ABNORMAL HIGH (ref 70–99)
Glucose-Capillary: 180 mg/dL — ABNORMAL HIGH (ref 70–99)
Glucose-Capillary: 214 mg/dL — ABNORMAL HIGH (ref 70–99)
Glucose-Capillary: 249 mg/dL — ABNORMAL HIGH (ref 70–99)
Glucose-Capillary: 285 mg/dL — ABNORMAL HIGH (ref 70–99)
Glucose-Capillary: 354 mg/dL — ABNORMAL HIGH (ref 70–99)
Glucose-Capillary: 386 mg/dL — ABNORMAL HIGH (ref 70–99)

## 2018-10-11 LAB — CBC
HCT: 30.9 % — ABNORMAL LOW (ref 39.0–52.0)
HCT: 32.4 % — ABNORMAL LOW (ref 39.0–52.0)
Hemoglobin: 10.3 g/dL — ABNORMAL LOW (ref 13.0–17.0)
Hemoglobin: 10.8 g/dL — ABNORMAL LOW (ref 13.0–17.0)
MCH: 33.7 pg (ref 26.0–34.0)
MCH: 33.5 pg (ref 26.0–34.0)
MCHC: 33.3 g/dL (ref 30.0–36.0)
MCHC: 33.3 g/dL (ref 30.0–36.0)
MCV: 101 fL — ABNORMAL HIGH (ref 80.0–100.0)
MCV: 100.6 fL — ABNORMAL HIGH (ref 80.0–100.0)
Platelets: 363 10*3/uL (ref 150–400)
Platelets: 420 10*3/uL — ABNORMAL HIGH (ref 150–400)
RBC: 3.06 MIL/uL — ABNORMAL LOW (ref 4.22–5.81)
RBC: 3.22 MIL/uL — ABNORMAL LOW (ref 4.22–5.81)
RDW: 12.5 % (ref 11.5–15.5)
RDW: 12.5 % (ref 11.5–15.5)
WBC: 17.3 10*3/uL — ABNORMAL HIGH (ref 4.0–10.5)
WBC: 16.8 10*3/uL — ABNORMAL HIGH (ref 4.0–10.5)
nRBC: 0 % (ref 0.0–0.2)
nRBC: 0 % (ref 0.0–0.2)

## 2018-10-11 LAB — SURGICAL PCR SCREEN
MRSA, PCR: NEGATIVE
Staphylococcus aureus: NEGATIVE

## 2018-10-11 LAB — CREATININE, SERUM
Creatinine, Ser: 1.28 mg/dL — ABNORMAL HIGH (ref 0.61–1.24)
GFR calc Af Amer: 55 mL/min — ABNORMAL LOW (ref >60)
GFR calc non Af Amer: 48 mL/min — ABNORMAL LOW (ref >60)

## 2018-10-11 LAB — BASIC METABOLIC PANEL
Anion gap: 7 (ref 5–15)
BUN: 26 mg/dL — ABNORMAL HIGH (ref 8–23)
CO2: 26 mmol/L (ref 22–32)
Calcium: 7.9 mg/dL — ABNORMAL LOW (ref 8.9–10.3)
Chloride: 101 mmol/L (ref 98–111)
Creatinine, Ser: 1.45 mg/dL — ABNORMAL HIGH (ref 0.61–1.24)
GFR calc Af Amer: 47 mL/min — ABNORMAL LOW (ref >60)
GFR calc non Af Amer: 41 mL/min — ABNORMAL LOW (ref >60)
Glucose, Bld: 194 mg/dL — ABNORMAL HIGH (ref 70–99)
Potassium: 4.6 mmol/L (ref 3.5–5.1)
Sodium: 134 mmol/L — ABNORMAL LOW (ref 135–145)

## 2018-10-11 LAB — PROTIME-INR
INR: 1.2 (ref 0.8–1.2)
Prothrombin Time: 15.2 seconds (ref 11.4–15.2)

## 2018-10-11 SURGERY — AMPUTATION, ABOVE KNEE
Anesthesia: General | Site: Leg Upper | Laterality: Left

## 2018-10-11 MED ORDER — LIDOCAINE 2% (20 MG/ML) 5 ML SYRINGE
INTRAMUSCULAR | Status: DC | PRN
  Administered 2018-10-11: 100 mg via INTRAVENOUS

## 2018-10-11 MED ORDER — OXYCODONE HCL 5 MG PO TABS
5.0000 mg | ORAL_TABLET | ORAL | Status: DC | PRN
  Filled 2018-10-11: qty 2
  Filled 2018-10-11: qty 1

## 2018-10-11 MED ORDER — ROCURONIUM BROMIDE 100 MG/10ML IV SOLN: INTRAVENOUS | Status: DC | PRN

## 2018-10-11 MED ORDER — 0.9 % SODIUM CHLORIDE (POUR BTL) OPTIME
TOPICAL | Status: DC | PRN
  Administered 2018-10-11: 1000 mL

## 2018-10-11 MED ORDER — ACETAMINOPHEN 325 MG PO TABS
325.0000 mg | ORAL_TABLET | ORAL | Status: DC | PRN
  Administered 2018-10-13: 650 mg via ORAL
  Filled 2018-10-11: qty 2

## 2018-10-11 MED ORDER — PHENYLEPHRINE 40 MCG/ML (10ML) SYRINGE FOR IV PUSH (FOR BLOOD PRESSURE SUPPORT)
PREFILLED_SYRINGE | INTRAVENOUS | Status: DC | PRN
  Administered 2018-10-11 (×2): 80 ug via INTRAVENOUS

## 2018-10-11 MED ORDER — INSULIN ASPART 100 UNIT/ML ~~LOC~~ SOLN
0.0000 [IU] | SUBCUTANEOUS | Status: DC
  Administered 2018-10-11: 15:00:00 3 [IU] via SUBCUTANEOUS
  Administered 2018-10-11 (×2): 9 [IU] via SUBCUTANEOUS
  Administered 2018-10-12: 5 [IU] via SUBCUTANEOUS
  Administered 2018-10-12: 21:00:00 3 [IU] via SUBCUTANEOUS
  Administered 2018-10-12: 13:00:00 9 [IU] via SUBCUTANEOUS
  Administered 2018-10-12: 04:00:00 3 [IU] via SUBCUTANEOUS
  Administered 2018-10-12: 18:00:00 1 [IU] via SUBCUTANEOUS
  Administered 2018-10-12: 09:00:00 2 [IU] via SUBCUTANEOUS
  Administered 2018-10-13: 1 [IU] via SUBCUTANEOUS
  Administered 2018-10-13 (×2): 3 [IU] via SUBCUTANEOUS

## 2018-10-11 MED ORDER — MAGNESIUM SULFATE 2 GM/50ML IV SOLN: 2.0000 g | Freq: Every day | INTRAVENOUS | Status: DC | PRN

## 2018-10-11 MED ORDER — LACTATED RINGERS IV SOLN
INTRAVENOUS | Status: DC | PRN
  Administered 2018-10-11 (×2): via INTRAVENOUS

## 2018-10-11 MED ORDER — FENTANYL CITRATE (PF) 100 MCG/2ML IJ SOLN: 25.0000 ug | INTRAMUSCULAR | Status: DC | PRN

## 2018-10-11 MED ORDER — HYDROMORPHONE HCL 1 MG/ML IJ SOLN: 0.5000 mg | INTRAMUSCULAR | Status: DC | PRN

## 2018-10-11 MED ORDER — PROPOFOL 10 MG/ML IV BOLUS
INTRAVENOUS | Status: DC | PRN
  Administered 2018-10-11: 120 mg via INTRAVENOUS

## 2018-10-11 MED ORDER — ACETAMINOPHEN 325 MG RE SUPP: 325.0000 mg | RECTAL | Status: DC | PRN

## 2018-10-11 MED ORDER — POTASSIUM CHLORIDE CRYS ER 20 MEQ PO TBCR: 20.0000 meq | EXTENDED_RELEASE_TABLET | Freq: Every day | ORAL | Status: DC | PRN

## 2018-10-11 MED ORDER — SODIUM CHLORIDE 0.9 % IV SOLN: INTRAVENOUS | Status: DC

## 2018-10-11 MED ORDER — EPHEDRINE SULFATE-NACL 50-0.9 MG/10ML-% IV SOSY
PREFILLED_SYRINGE | INTRAVENOUS | Status: DC | PRN
  Administered 2018-10-11: 15 mg via INTRAVENOUS

## 2018-10-11 MED ORDER — SUCCINYLCHOLINE CHLORIDE 200 MG/10ML IV SOSY
PREFILLED_SYRINGE | INTRAVENOUS | Status: DC | PRN
  Administered 2018-10-11: 120 mg via INTRAVENOUS

## 2018-10-11 MED ORDER — FENTANYL CITRATE (PF) 250 MCG/5ML IJ SOLN
INTRAMUSCULAR | Status: DC | PRN
  Administered 2018-10-11: 50 ug via INTRAVENOUS
  Administered 2018-10-11: 25 ug via INTRAVENOUS
  Administered 2018-10-11: 50 ug via INTRAVENOUS
  Administered 2018-10-11: 25 ug via INTRAVENOUS

## 2018-10-11 MED ORDER — ONDANSETRON HCL 4 MG/2ML IJ SOLN
INTRAMUSCULAR | Status: DC | PRN
  Administered 2018-10-11: 4 mg via INTRAVENOUS

## 2018-10-11 MED ORDER — SODIUM CHLORIDE 0.9 % IV SOLN
INTRAVENOUS | Status: DC | PRN
  Administered 2018-10-11: 25 ug/min via INTRAVENOUS

## 2018-10-11 MED ORDER — SUGAMMADEX SODIUM 200 MG/2ML IV SOLN
INTRAVENOUS | Status: DC | PRN
  Administered 2018-10-11: 130 mg via INTRAVENOUS

## 2018-10-11 MED ORDER — ARTIFICIAL TEARS OPHTHALMIC OINT
TOPICAL_OINTMENT | OPHTHALMIC | Status: DC | PRN
  Administered 2018-10-11: 1 via OPHTHALMIC

## 2018-10-11 MED ORDER — ROCURONIUM BROMIDE 10 MG/ML (PF) SYRINGE
PREFILLED_SYRINGE | INTRAVENOUS | Status: DC | PRN
  Administered 2018-10-11: 30 mg via INTRAVENOUS

## 2018-10-11 MED ORDER — DEXAMETHASONE SODIUM PHOSPHATE 10 MG/ML IJ SOLN
INTRAMUSCULAR | Status: DC | PRN
  Administered 2018-10-11: 10 mg via INTRAVENOUS

## 2018-10-11 MED ORDER — DOCUSATE SODIUM 100 MG PO CAPS
100.0000 mg | ORAL_CAPSULE | Freq: Every day | ORAL | Status: DC
  Administered 2018-10-12 – 2018-10-13 (×2): 100 mg via ORAL
  Filled 2018-10-11 (×3): qty 1

## 2018-10-11 SURGICAL SUPPLY — 47 items
BANDAGE ACE 4X5 VEL STRL LF (GAUZE/BANDAGES/DRESSINGS) ×3 IMPLANT
BANDAGE ACE 6X5 VEL STRL LF (GAUZE/BANDAGES/DRESSINGS) ×3 IMPLANT
BANDAGE ESMARK 6X9 LF (GAUZE/BANDAGES/DRESSINGS) ×1 IMPLANT
BLADE SAW SAG 73X25 THK (BLADE) ×1
BLADE SAW SGTL 73X25 THK (BLADE) ×2 IMPLANT
BNDG COHESIVE 6X5 TAN STRL LF (GAUZE/BANDAGES/DRESSINGS) ×3 IMPLANT
BNDG ESMARK 6X9 LF (GAUZE/BANDAGES/DRESSINGS) ×3
BNDG GAUZE ELAST 4 BULKY (GAUZE/BANDAGES/DRESSINGS) ×3 IMPLANT
CANISTER SUCT 3000ML PPV (MISCELLANEOUS) ×3 IMPLANT
CLIP VESOCCLUDE MED 6/CT (CLIP) ×3 IMPLANT
COVER BACK TABLE 60X90IN (DRAPES) ×3 IMPLANT
COVER SURGICAL LIGHT HANDLE (MISCELLANEOUS) ×6 IMPLANT
COVER WAND RF STERILE (DRAPES) IMPLANT
DRAIN CHANNEL 19F RND (DRAIN) IMPLANT
DRAPE HALF SHEET 40X57 (DRAPES) ×3 IMPLANT
DRAPE ORTHO SPLIT 77X108 STRL (DRAPES) ×4
DRAPE SURG ORHT 6 SPLT 77X108 (DRAPES) ×2 IMPLANT
DRAPE U-SHAPE 47X51 STRL (DRAPES) IMPLANT
DRSG ADAPTIC 3X8 NADH LF (GAUZE/BANDAGES/DRESSINGS) ×3 IMPLANT
ELECT CAUTERY BLADE 6.4 (BLADE) ×3 IMPLANT
ELECT REM PT RETURN 9FT ADLT (ELECTROSURGICAL) ×3
ELECTRODE REM PT RTRN 9FT ADLT (ELECTROSURGICAL) ×1 IMPLANT
EVACUATOR SILICONE 100CC (DRAIN) IMPLANT
GAUZE SPONGE 4X4 12PLY STRL (GAUZE/BANDAGES/DRESSINGS) ×6 IMPLANT
GAUZE SPONGE 4X4 12PLY STRL LF (GAUZE/BANDAGES/DRESSINGS) ×3 IMPLANT
GLOVE BIO SURGEON STRL SZ7.5 (GLOVE) ×9 IMPLANT
GLOVE BIOGEL PI IND STRL 8 (GLOVE) ×1 IMPLANT
GLOVE BIOGEL PI INDICATOR 8 (GLOVE) ×2
GOWN STRL REUS W/ TWL XL LVL3 (GOWN DISPOSABLE) ×1 IMPLANT
GOWN STRL REUS W/TWL XL LVL3 (GOWN DISPOSABLE) ×2
KIT BASIN OR (CUSTOM PROCEDURE TRAY) ×3 IMPLANT
KIT TURNOVER KIT B (KITS) ×3 IMPLANT
NS IRRIG 1000ML POUR BTL (IV SOLUTION) ×3 IMPLANT
PACK GENERAL/GYN (CUSTOM PROCEDURE TRAY) ×3 IMPLANT
PAD ARMBOARD 7.5X6 YLW CONV (MISCELLANEOUS) ×6 IMPLANT
STAPLER VISISTAT 35W (STAPLE) ×3 IMPLANT
STOCKINETTE IMPERVIOUS LG (DRAPES) ×3 IMPLANT
SUT ETHILON 3 0 PS 1 (SUTURE) IMPLANT
SUT SILK 0 TIES 10X30 (SUTURE) ×3 IMPLANT
SUT SILK 2 0 (SUTURE) ×2
SUT SILK 2 0 SH CR/8 (SUTURE) ×3 IMPLANT
SUT SILK 2-0 18XBRD TIE 12 (SUTURE) ×1 IMPLANT
SUT VIC AB 2-0 CT1 18 (SUTURE) ×12 IMPLANT
SUT VIC AB 3-0 SH 18 (SUTURE) IMPLANT
TOWEL GREEN STERILE (TOWEL DISPOSABLE) ×6 IMPLANT
UNDERPAD 30X30 (UNDERPADS AND DIAPERS) ×3 IMPLANT
WATER STERILE IRR 1000ML POUR (IV SOLUTION) ×3 IMPLANT

## 2018-10-11 NOTE — Progress Notes (Signed)
Chaplain Note Follow up.  Following Chaplains contact with the pt. Yesterday, [facility] which is his [facility], was contacted. The priest has been informed of his request and the patients phone number to his room will be provided to enable a conversation between the patient and his priest.   If additional support is needed please contact the chaplain's office or page the on duty chaplain [phone]  Sue Lush

## 2018-10-11 NOTE — Anesthesia Procedure Notes (Signed)
Procedure Name: Intubation Date/Time: 10/11/2018 7:38 AM Performed by: Jearld Pies, CRNA Pre-anesthesia Checklist: Patient identified, Emergency Drugs available, Suction available and Patient being monitored Patient Re-evaluated:Patient Re-evaluated prior to induction Oxygen Delivery Method: Circle System Utilized Preoxygenation: Pre-oxygenation with 100% oxygen Induction Type: IV induction and Rapid sequence Laryngoscope Size: Mac and 4 Grade View: Grade I Tube type: Oral Tube size: 7.5 mm Number of attempts: 1 Airway Equipment and Method: Stylet and Oral airway Placement Confirmation: ETT inserted through vocal cords under direct vision,  positive ETCO2 and breath sounds checked- equal and bilateral Secured at: 23 cm Tube secured with: Tape Dental Injury: Teeth and Oropharynx as per pre-operative assessment

## 2018-10-11 NOTE — Progress Notes (Signed)
[facility] of [place]  Subjective  - No complaints.   Objective (P) 127/60 (P) 73 (P) 98.1 F (36.7 C) ((P) Oral) (P) 19 (P) 96%  Intake/Output Summary (Last 24 hours) at 10/11/2018 0716 Last data filed at 10/11/2018 0400 Gross per 24 hour  Intake 1823.36 ml  Output 725 ml  Net 1098.36 ml    Left foot with profound ischemic changes  Laboratory Lab Results: Recent Labs    10/10/18 2123 10/11/18 0306  WBC 18.2* 17.3*  HGB 10.0* 10.3*  HCT 29.6* 30.9*  PLT 409* 363   BMET Recent Labs    10/09/18 1920 10/11/18 0306  NA 133* 134*  K 4.2 4.6  CL 96* 101  CO2 28 26  GLUCOSE 194* 194*  BUN 27* 26*  CREATININE 1.57* 1.45*  CALCIUM 8.8* 7.9*    COAG Lab Results  Component Value Date   INR 1.2 10/11/2018   INR 1.06 04/01/2018   No results found for: PTT  Assessment/Planning:  Plan for left above knee amputation today after risks and benefits discussed.  Patient wants to give himself best chance of healing and we discussed AKA best option given SFA and severe tibial disease.    Marty Heck 10/11/2018 7:16 AM --

## 2018-10-11 NOTE — Evaluation (Signed)
Physical Therapy Evaluation Patient Details Name: Nathan Cook MRN: [id number] DOB: 11/29/1921 Today's Date: 10/11/2018   History of Present Illness  Pt is a 85 yo male admitted for LLE ischemia, s/p L AKA on 10/11/18. 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  Clinical Impression   Pt presents with mild LLE pain s/p AKA, difficulty performing mobility tasks including bed mobility and transfers, increased time and effort to perform mobility tasks, and impaired sitting balance s/p AKA. Pt to benefit from acute PT to address deficits. Pt perform lateral transfer to drop arm recliner this session with min assist for steadying and guidance. Pt is cheerful and positive with PT, and was excited to mobilize today. PT recommending CIR to return pt to PLOF, which pt states is independence. Additionally, it is the pt's goal to go to post-acute rehab. PT to progress mobility as tolerated, and will continue to follow acutely.      Follow Up Recommendations CIR    Equipment Recommendations  Other (comment)(defer)    Recommendations for Other Services       Precautions / Restrictions Precautions Precautions: Fall Restrictions Weight Bearing Restrictions: Yes LLE Weight Bearing: Non weight bearing      Mobility  Bed Mobility Overal bed mobility: Needs Assistance Bed Mobility: Supine to Sit     Supine to sit: Min assist;HOB elevated     General bed mobility comments: Min assist for trunk elevation, pt able to scoot to EOB without physical assist with increased time.   Transfers Overall transfer level: Needs assistance Equipment used: None Transfers: Lateral/Scoot Transfers          Lateral/Scoot Transfers: Min assist;From elevated surface General transfer comment: Min assist for steadying, guiding pt to drop arm recliner. Pt able to push up with tricep extension bilaterally to laterally scoot, PT assist with pelvic  translation and placement.   Ambulation/Gait                Stairs            Wheelchair Mobility    Modified Rankin (Stroke Patients Only)       Balance Overall balance assessment: Needs assistance;History of Falls Sitting-balance support: Bilateral upper extremity supported;Feet supported Sitting balance-Leahy Scale: Fair Sitting balance - Comments: Able to sit EOB without PT support, difficulty maintaining balance without PT support with LE/cervical movement. Pt with LOB posteriorly and to R with LE and cervical movement, corrected by PT. Postural control: Posterior lean;Right lateral lean Standing balance support: (NT)                                 Pertinent Vitals/Pain Pain Assessment: Faces Faces Pain Scale: Hurts a little bit Pain Location: LLE, with hip extension Pain Descriptors / Indicators: Sore Pain Intervention(s): Limited activity within patient's tolerance;Monitored during session;Repositioned;Premedicated before session    Home Living Family/patient expects to be discharged to:: Skilled nursing facility Living Arrangements: Spouse/significant other Available Help at Discharge: Family;Available 24 hours/day Type of Home: Independent living facility([facility] ) Home Access: Level entry     Home Layout: One level Home Equipment: Grab bars - tub/shower;Shower seat - built in      Prior Function Level of Independence: Independent         Comments: Used to enjoy painting, sketching; singing     Hand Dominance   Dominant Hand: Right  Extremity/Trunk Assessment   Upper Extremity Assessment Upper Extremity Assessment: Overall WFL for tasks assessed;RUE deficits/detail RUE Deficits / Details: Pt with history of RTC tear, difficulty with UE elevation >90* RUE Sensation: history of peripheral neuropathy(bilaterally)    Lower Extremity Assessment Lower Extremity Assessment: Generalized weakness;LLE deficits/detail;RLE  deficits/detail RLE Deficits / Details: Pt able to perform hip extension, hip abd/add, hip flexion LLE Deficits / Details: Able to perform seated marches, LAQ    Cervical / Trunk Assessment Cervical / Trunk Assessment: Kyphotic;Other exceptions Cervical / Trunk Exceptions: forward head, cervical stiffness  Communication   Communication: Expressive difficulties  Cognition Arousal/Alertness: Awake/alert Behavior During Therapy: WFL for tasks assessed/performed Overall Cognitive Status: Within Functional Limits for tasks assessed                                 General Comments: Pt with tangential speech, difficult to understand at times. Cognition appears to be WNL for session, able to follow commands well.       General Comments      Exercises Amputee Exercises Hip Extension: AROM;Left;10 reps;Seated Hip ABduction/ADduction: AROM;Left;10 reps;Seated   Assessment/Plan    PT Assessment Patient needs continued PT services  PT Problem List Decreased strength;Decreased mobility;Decreased activity tolerance;Decreased balance;Pain       PT Treatment Interventions DME instruction;Functional mobility training;Balance training;Patient/family education;Therapeutic activities;Stair training;Therapeutic exercise;Neuromuscular re-education    PT Goals (Current goals can be found in the Care Plan section)  Acute Rehab PT Goals Patient Stated Goal: return home after rehab PT Goal Formulation: With patient Time For Goal Achievement: 10/25/18 Potential to Achieve Goals: Good    Frequency Min 4X/week   Barriers to discharge        Co-evaluation               AM-PAC PT "6 Clicks" Mobility  Outcome Measure Help needed turning from your back to your side while in a flat bed without using bedrails?: A Little Help needed moving from lying on your back to sitting on the side of a flat bed without using bedrails?: A Little Help needed moving to and from a bed to a chair  (including a wheelchair)?: A Little Help needed standing up from a chair using your arms (e.g., wheelchair or bedside chair)?: A Lot Help needed to walk in hospital room?: Total Help needed climbing 3-5 steps with a railing? : Total 6 Click Score: 13    End of Session   Activity Tolerance: Patient tolerated treatment well Patient left: in chair;with chair alarm set;with call bell/phone within reach Nurse Communication: Mobility status PT Visit Diagnosis: Other abnormalities of gait and mobility (R26.89);History of falling (Z91.81);Pain Pain - Right/Left: Left Pain - part of body: Leg    Time: 7793-9030 PT Time Calculation (min) (ACUTE ONLY): 30 min   Charges:   PT Evaluation $PT Eval Low Complexity: 1 Low PT Treatments $Therapeutic Activity: 8-22 mins       Aarit Kashuba Conception Chancy, PT Acute Rehabilitation Services Pager [phone]  Office [phone]   Aryan Sparks D Elonda Husky 10/11/2018, 3:29 PM

## 2018-10-11 NOTE — Transfer of Care (Signed)
Immediate Anesthesia Transfer of Care Note  Patient: Nathan Cook  Procedure(s) Performed: Left  AMPUTATION ABOVE KNEE (Left Leg Upper)  Patient Location: PACU  Anesthesia Type:General  Level of Consciousness: drowsy, patient cooperative and responds to stimulation  Airway & Oxygen Therapy: Patient Spontanous Breathing and Patient connected to face mask oxygen  Post-op Assessment: Report given to RN and Post -op Vital signs reviewed and stable  Post vital signs: Reviewed and stable  Last Vitals:  Vitals Value Taken Time  BP 131/56   Temp    Pulse 78   Resp 16   SpO2 100     Last Pain:  Vitals:   10/11/18 0452  TempSrc: (P) Oral  PainSc:       Patients Stated Pain Goal: 0 (51/89/84 2103)  Complications: No apparent anesthesia complications

## 2018-10-11 NOTE — Op Note (Signed)
° ° °  OPERATIVE NOTE   PROCEDURE: 1. Left above-the-knee amputation  PRE-OPERATIVE DIAGNOSIS: left foot gangrene  POST-OPERATIVE DIAGNOSIS: same as above  SURGEON: Marty Heck, MD  ASSISTANT(S): Roxy Horseman, PA  ANESTHESIA: general  ESTIMATED BLOOD LOSS: <100 cc  FINDING(S): Left above knee amputation with healthy tissue margins proximally.  SPECIMEN(S):  left above-the-knee amputation  INDICATIONS:   Nathan Cook is a 85 y.o. male who presents with left foot gangrene with no additional options for revascularization.  The patient is scheduled for a left above-the-knee amputation.  I discussed in depth with the patient the risks, benefits, and alternatives to this procedure.  The patient is aware that the risk of this operation included but are not limited to:  bleeding, infection, myocardial infarction, stroke, death, failure to heal amputation wound, and possible need for more proximal amputation.  The patient is aware of the risks and agrees proceed forward with the procedure.  DESCRIPTION: After full informed written consent was obtained from the patient, the patient was brought back to the operating room, and placed supine upon the operating table.  Prior to induction, the patient received IV antibiotics.  After obtaining adequate anesthesia, the patient was prepped and draped in the standard fashion for a above-the-knee amputation.  A prep timeout was performed to identify patient, procedure, and site.  I marked out the anterior and posterior flaps for a fish-mouth type of amputation. I made the incisions for these flaps, and then dissected through the subcutaneous tissue, fascia, and muscles circumferentially with Bovie cautery.  I elevated  the periosteal tissue more proximal than the anterior skin flap.  I then transected the femur with a power saw at this level.  I completed the posterior flap with Bovie cautery.  Then I smoothed out the rough edges of the  bone with a rasp.  At this point, the specimen was passed off the field as the above-the-knee amputation.  At this point, I clamped all visibly bleeding arteries and veins using a combination of suture ligation with 2-0 silk ties and the femoral vessels were ligated with two 2-0 suture ligatures.  Bleeding continued to be controlled with electrocautery and suture ligature.  The stump was washed off with sterile normal saline and no further active bleeding was noted.  I reapproximated the anterior and posterior fascia  with interrupted stitches of 2-0 Vicryl.  This was completed along the entire length of anterior and posterior fascia until there were no more loose space in the fascial line.  The skin was then  reapproximated with staples.  The stump was washed off and dried.  The incision was dressed with [employer].  Kerlix was wrapped around the leg and then gently an ACE wrap was applied.    COMPLICATIONS: None  CONDITION: Stable   Marty Heck, MD [facility] Office: [phone] Pager: [phone]  10/11/2018, 8:50 AM

## 2018-10-11 NOTE — Anesthesia Postprocedure Evaluation (Signed)
Anesthesia Post Note  Patient: Nathan Cook  Procedure(s) Performed: Left  AMPUTATION ABOVE KNEE (Left Leg Upper)     Patient location during evaluation: PACU Anesthesia Type: General Level of consciousness: awake Pain management: pain level controlled Vital Signs Assessment: post-procedure vital signs reviewed and stable Respiratory status: spontaneous breathing Postop Assessment: no apparent nausea or vomiting Anesthetic complications: no    Last Vitals:  Vitals:   10/11/18 0950 10/11/18 1148  BP: (!) 140/57 130/63  Pulse: 82 92  Resp:  15  Temp: (!) 36.4 C (!) 36.3 C  SpO2: 93% 90%    Last Pain:  Vitals:   10/11/18 1239  TempSrc:   PainSc: Asleep                 Ceola Para

## 2018-10-11 NOTE — Progress Notes (Signed)
Inpatient Rehabilitation-Admissions Coordinator   CIR consult received. AC will await completion of therapy evaluations to help determine if CIR is the most appropriate post acute therapy venue. Will follow up with pt on Monday.   Jhonnie Garner, OTR/L  Rehab Admissions Coordinator  [phone] 10/11/2018 2:46 PM

## 2018-10-11 NOTE — Progress Notes (Signed)
This chaplain returned the phone call from Adonis Housekeeper [phone] at [place] [facility] [employer].  The chaplain confirmed the spelling of the Pt. name and heard the parish's availability to be contacted as needed. The Pt. will be put on the parish's prayer list.

## 2018-10-12 ENCOUNTER — Encounter (HOSPITAL_COMMUNITY): Payer: Self-pay | Admitting: Vascular Surgery

## 2018-10-12 LAB — BASIC METABOLIC PANEL
Anion gap: 9 (ref 5–15)
BUN: 31 mg/dL — ABNORMAL HIGH (ref 8–23)
CO2: 23 mmol/L (ref 22–32)
Calcium: 8 mg/dL — ABNORMAL LOW (ref 8.9–10.3)
Chloride: 100 mmol/L (ref 98–111)
Creatinine, Ser: 1.41 mg/dL — ABNORMAL HIGH (ref 0.61–1.24)
GFR calc Af Amer: 49 mL/min — ABNORMAL LOW (ref >60)
GFR calc non Af Amer: 42 mL/min — ABNORMAL LOW (ref >60)
Glucose, Bld: 243 mg/dL — ABNORMAL HIGH (ref 70–99)
Potassium: 4.9 mmol/L (ref 3.5–5.1)
Sodium: 132 mmol/L — ABNORMAL LOW (ref 135–145)

## 2018-10-12 LAB — GLUCOSE, CAPILLARY
Glucose-Capillary: 227 mg/dL — ABNORMAL HIGH (ref 70–99)
Glucose-Capillary: 187 mg/dL — ABNORMAL HIGH (ref 70–99)
Glucose-Capillary: 393 mg/dL — ABNORMAL HIGH (ref 70–99)
Glucose-Capillary: 136 mg/dL — ABNORMAL HIGH (ref 70–99)
Glucose-Capillary: 205 mg/dL — ABNORMAL HIGH (ref 70–99)

## 2018-10-12 LAB — CBC
HCT: 29.5 % — ABNORMAL LOW (ref 39.0–52.0)
Hemoglobin: 9.8 g/dL — ABNORMAL LOW (ref 13.0–17.0)
MCH: 33 pg (ref 26.0–34.0)
MCHC: 33.2 g/dL (ref 30.0–36.0)
MCV: 99.3 fL (ref 80.0–100.0)
Platelets: 414 10*3/uL — ABNORMAL HIGH (ref 150–400)
RBC: 2.97 MIL/uL — ABNORMAL LOW (ref 4.22–5.81)
RDW: 12.5 % (ref 11.5–15.5)
WBC: 24 10*3/uL — ABNORMAL HIGH (ref 4.0–10.5)
nRBC: 0 % (ref 0.0–0.2)

## 2018-10-12 NOTE — Progress Notes (Signed)
  Progress Note    10/12/2018 8:36 AM 1 Day Post-Op  Subjective: Denies any pain  Vitals:   10/11/18 2100 10/12/18 0335  BP: (!) 100/56 (!) 119/57  Pulse: 76   Resp: 20   Temp:  (!) 97.4 F (36.3 C)  SpO2: 96%     Physical Exam: Awake alert oriented Left above-knee amputation site with dressing in place  CBC    Component Value Date/Time   WBC 24.0 (H) 10/12/2018 0312   RBC 2.97 (L) 10/12/2018 0312   HGB 9.8 (L) 10/12/2018 0312   HCT 29.5 (L) 10/12/2018 0312   PLT 414 (H) 10/12/2018 0312   MCV 99.3 10/12/2018 0312   MCH 33.0 10/12/2018 0312   MCHC 33.2 10/12/2018 0312   RDW 12.5 10/12/2018 0312   LYMPHSABS 1,486 08/29/2018 0815   MONOABS 0.8 04/01/2018 1432   EOSABS 398 08/29/2018 0815   BASOSABS 59 08/29/2018 0815    BMET    Component Value Date/Time   NA 132 (L) 10/12/2018 0312   NA 139 12/27/2015   K 4.9 10/12/2018 0312   CL 100 10/12/2018 0312   CO2 23 10/12/2018 0312   GLUCOSE 243 (H) 10/12/2018 0312   BUN 31 (H) 10/12/2018 0312   BUN 21 12/27/2015   CREATININE 1.41 (H) 10/12/2018 0312   CREATININE 1.44 (H) 08/29/2018 0815   CALCIUM 8.0 (L) 10/12/2018 0312   GFRNONAA 42 (L) 10/12/2018 0312   GFRNONAA 41 (L) 08/29/2018 0815   GFRAA 49 (L) 10/12/2018 0312   GFRAA 48 (L) 08/29/2018 0815    INR    Component Value Date/Time   INR 1.2 10/11/2018 0306     Intake/Output Summary (Last 24 hours) at 10/12/2018 0836 Last data filed at 10/12/2018 0336 Gross per 24 hour  Intake 1516.15 ml  Output 300 ml  Net 1216.15 ml     Assessment:  85 y.o. male is s/p left above-knee amputation doing well  Plan: Dressing down tomorrow and restart Eliquis Subcutaneous heparin DVT prophylaxis for now Recommendation for CIR   Brandon C. Donzetta Matters, MD [facility] Office: [phone] Pager: [phone]  10/12/2018 8:36 AM

## 2018-10-12 NOTE — Progress Notes (Signed)
Physical Therapy Treatment Patient Details Name: Nathan Cook MRN: [id number] DOB: [birth year] Today's Date: 10/12/2018    History of Present Illness Pt is a 85 yo male admitted for LLE ischemia, s/p L AKA on 10/11/18. 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    PT Comments    Pt admitted with above diagnosis. Pt currently with functional limitations due to the deficits listed below (see PT Problem List). Pt was able to scoot to recliner from the bed with min assist and cues. Pt did well overall with one posterior LOB needing steadying assist as his foot was sliding out from under him.  Pt also performed LE exercises.  Pt did inform this PT that he was in process of getting electric wheelchair from [facility] care.  They have the order.  CM - can you contact them and arrange for them to measure pt while in hospital?  Also he states he and his wife were in I living at [facility].  Wife is now in the REhab section and once they both finish Rehab they will be in the A living section.  Will follow acutely.   Pt will benefit from skilled PT to increase their independence and safety with mobility to allow discharge to the venue listed below.     Follow Up Recommendations  CIR     Equipment Recommendations  Other (comment)(defer)    Recommendations for Other Services       Precautions / Restrictions Precautions Precautions: Fall Restrictions Weight Bearing Restrictions: Yes LLE Weight Bearing: Non weight bearing    Mobility  Bed Mobility Overal bed mobility: Needs Assistance Bed Mobility: Supine to Sit     Supine to sit: Min assist;HOB elevated     General bed mobility comments: Min assist for trunk elevation, pt able to scoot to EOB without physical assist with increased time.   Transfers Overall transfer level: Needs assistance Equipment used: None Transfers: Lateral/Scoot Transfers          Lateral/Scoot  Transfers: Min assist;From elevated surface General transfer comment: Min assist for steadying, guiding pt to drop arm recliner. Pt able to push up with tricep extension bilaterally to laterally scoot, PT assist with pelvic translation and placement. used pad to assist  Ambulation/Gait                 Stairs             Wheelchair Mobility    Modified Rankin (Stroke Patients Only)       Balance Overall balance assessment: Needs assistance;History of Falls Sitting-balance support: Bilateral upper extremity supported;Feet supported Sitting balance-Leahy Scale: Fair Sitting balance - Comments: Able to sit EOB without PT support, difficulty maintaining balance without PT support with LE/cervical movement. Pt with LOB posteriorly and to R with LE and cervical movement, corrected by PT. Postural control: Posterior lean;Right lateral lean                                  Cognition Arousal/Alertness: Awake/alert Behavior During Therapy: WFL for tasks assessed/performed Overall Cognitive Status: Within Functional Limits for tasks assessed                                 General Comments: Pt with tangential speech, difficult to understand at times. Cognition appears  to be WNL for session, able to follow commands well.       Exercises Amputee Exercises Quad Sets: AROM;Both;15 reps;Seated Gluteal Sets: AROM;Both;15 reps;Seated Towel Squeeze: AROM;Both;10 reps;Seated Hip Extension: AROM;Left;10 reps;Seated Hip ABduction/ADduction: AROM;Left;10 reps;Seated Hip Flexion/Marching: AROM;Both;15 reps;Seated    General Comments        Pertinent Vitals/Pain Pain Assessment: Faces Faces Pain Scale: Hurts a little bit Pain Location: LLE, with hip extension Pain Descriptors / Indicators: Sore Pain Intervention(s): Limited activity within patient's tolerance;Monitored during session;Repositioned    Home Living                      Prior  Function            PT Goals (current goals can now be found in the care plan section) Acute Rehab PT Goals Patient Stated Goal: return home after rehab Progress towards PT goals: Progressing toward goals    Frequency    Min 4X/week      PT Plan Current plan remains appropriate    Co-evaluation              AM-PAC PT "6 Clicks" Mobility   Outcome Measure  Help needed turning from your back to your side while in a flat bed without using bedrails?: A Little Help needed moving from lying on your back to sitting on the side of a flat bed without using bedrails?: A Little Help needed moving to and from a bed to a chair (including a wheelchair)?: A Little Help needed standing up from a chair using your arms (e.g., wheelchair or bedside chair)?: A Lot Help needed to walk in hospital room?: Total Help needed climbing 3-5 steps with a railing? : Total 6 Click Score: 13    End of Session Equipment Utilized During Treatment: Gait belt Activity Tolerance: Patient tolerated treatment well Patient left: in chair;with chair alarm set;with call bell/phone within reach Nurse Communication: Mobility status PT Visit Diagnosis: Other abnormalities of gait and mobility (R26.89);History of falling (Z91.81);Pain Pain - Right/Left: Left Pain - part of body: Leg     Time: 1500-1520 PT Time Calculation (min) (ACUTE ONLY): 20 min  Charges:  $Therapeutic Activity: 8-22 mins                     [place] Pager:  [phone]  Office:  [place] 10/12/2018, 4:29 PM

## 2018-10-12 NOTE — Plan of Care (Signed)
Care plans reviewed and patient is progressing.  

## 2018-10-13 LAB — BASIC METABOLIC PANEL
Anion gap: 8 (ref 5–15)
BUN: 26 mg/dL — ABNORMAL HIGH (ref 8–23)
CO2: 25 mmol/L (ref 22–32)
Calcium: 7.7 mg/dL — ABNORMAL LOW (ref 8.9–10.3)
Chloride: 101 mmol/L (ref 98–111)
Creatinine, Ser: 1.3 mg/dL — ABNORMAL HIGH (ref 0.61–1.24)
GFR calc Af Amer: 54 mL/min — ABNORMAL LOW (ref >60)
GFR calc non Af Amer: 47 mL/min — ABNORMAL LOW (ref >60)
Glucose, Bld: 120 mg/dL — ABNORMAL HIGH (ref 70–99)
Potassium: 4.3 mmol/L (ref 3.5–5.1)
Sodium: 134 mmol/L — ABNORMAL LOW (ref 135–145)

## 2018-10-13 LAB — CBC
HCT: 26.9 % — ABNORMAL LOW (ref 39.0–52.0)
Hemoglobin: 8.7 g/dL — ABNORMAL LOW (ref 13.0–17.0)
MCH: 32.7 pg (ref 26.0–34.0)
MCHC: 32.3 g/dL (ref 30.0–36.0)
MCV: 101.1 fL — ABNORMAL HIGH (ref 80.0–100.0)
Platelets: 421 10*3/uL — ABNORMAL HIGH (ref 150–400)
RBC: 2.66 MIL/uL — ABNORMAL LOW (ref 4.22–5.81)
RDW: 13.1 % (ref 11.5–15.5)
WBC: 16.6 10*3/uL — ABNORMAL HIGH (ref 4.0–10.5)
nRBC: 0 % (ref 0.0–0.2)

## 2018-10-13 LAB — GLUCOSE, CAPILLARY
Glucose-Capillary: 118 mg/dL — ABNORMAL HIGH (ref 70–99)
Glucose-Capillary: 133 mg/dL — ABNORMAL HIGH (ref 70–99)
Glucose-Capillary: 193 mg/dL — ABNORMAL HIGH (ref 70–99)
Glucose-Capillary: 208 mg/dL — ABNORMAL HIGH (ref 70–99)
Glucose-Capillary: 226 mg/dL — ABNORMAL HIGH (ref 70–99)
Glucose-Capillary: 237 mg/dL — ABNORMAL HIGH (ref 70–99)

## 2018-10-13 MED ORDER — INSULIN ASPART 100 UNIT/ML ~~LOC~~ SOLN
0.0000 [IU] | Freq: Three times a day (TID) | SUBCUTANEOUS | Status: DC
  Administered 2018-10-14: 13:00:00 3 [IU] via SUBCUTANEOUS
  Administered 2018-10-14: 2 [IU] via SUBCUTANEOUS

## 2018-10-13 MED ORDER — APIXABAN 5 MG PO TABS
5.0000 mg | ORAL_TABLET | Freq: Two times a day (BID) | ORAL | Status: DC
  Administered 2018-10-13 – 2018-10-14 (×3): 5 mg via ORAL
  Filled 2018-10-13 (×3): qty 1

## 2018-10-13 NOTE — Progress Notes (Signed)
Patient assist x2 to chair for lunch. Patient states he is tired today. One of pink foam to left bottom cheek changed. Skin under pink foam to sacrum is red without blanching consistent with admission assessment. Heel floated in chair will monitor patient. Oakley Kossman, Bettina Gavia rN

## 2018-10-13 NOTE — Progress Notes (Signed)
ANTICOAGULATION CONSULT NOTE - Initial Consult  Pharmacy Consult for Eliquis Indication: atrial fibrillation  Allergies  Allergen Reactions  . Tape Other (See Comments)    TAPE WILL TEAR THE SKIN!!!!    Patient Measurements: Height: 5\' 9"  (175.3 cm) Weight: 143 lb (64.9 kg) IBW/kg (Calculated) : 70.7  Vital Signs: Temp: 98.1 F (36.7 C) (04/19 0421) Temp Source: Oral (04/19 0421) BP: 114/53 (04/19 0929) Pulse Rate: 70 (04/19 0929)  Labs: Recent Labs    10/11/18 0306 10/11/18 1114 10/12/18 0312 10/13/18 0257  HGB 10.3* 10.8* 9.8* 8.7*  HCT 30.9* 32.4* 29.5* 26.9*  PLT 363 420* 414* 421*  LABPROT 15.2  --   --   --   INR 1.2  --   --   --   CREATININE 1.45* 1.28* 1.41* 1.30*    Estimated Creatinine Clearance: 31.9 mL/min (A) (by C-G formula based on SCr of 1.3 mg/dL (H)).   Medical History: Past Medical History:  Diagnosis Date  . BPH (benign prostatic hyperplasia) 10/14/2014  . Cervicalgia 01/04/2016  . Chronic kidney disease   . Contusion of left shoulder 05/27/14  . DM type 2 (diabetes mellitus, type 2) ([place])   . Dysphagia   . High blood pressure   . History of basal cell cancer 11/21/2005   Right temple  . History of colonic polyps 10/14/2003  . Hyperlipidemia   . Left knee pain 05/27/14  . Peripheral vascular disease ([place])   . SAH (subarachnoid hemorrhage) ([place]) 2009   TBI  . Stroke ([place])   . TIA (transient ischemic attack) 11/06/2005   Left eye pain. Slurred speech. Diaphoresis. No residual effects.     Assessment: 85 yo male on chronic Eliquis, was held for L AKA.  Pharmacy asked to resume Eliquis today.  Weight is > 60 kg, Scr 1.3 and age 85 yo, so appropriate to resume PTA dose of 5 mg BID.  Goal of Therapy:   Monitor platelets by anticoagulation protocol: Yes   Plan:  Eliquis 5 mg BID  Marguerite Olea, [facility] Clinical Pharmacist Phone [phone]  10/13/2018 10:02 AM

## 2018-10-13 NOTE — Progress Notes (Addendum)
[facility] [place]  Subjective  - Doing well over all.   Objective (!) 125/54 82 98.1 F (36.7 C) (Oral) 10 93%  Intake/Output Summary (Last 24 hours) at 10/13/2018 0924 Last data filed at 10/13/2018 0425 Gross per 24 hour  Intake 480 ml  Output 1425 ml  Net -945 ml   Left AKA dressing removed, healing well.  Medial incisional minimal bleeding.  Clean dry dressing applied. Heart RRR Lungs non labored breathing   Assessment/Planning: POD # 2 L AKA  Plan for him to return to SNF consulted social work for help with discharge Stable disposition  Roxy Horseman 10/13/2018 9:24 AM --  Laboratory Lab Results: Recent Labs    10/12/18 0312 10/13/18 0257  WBC 24.0* 16.6*  HGB 9.8* 8.7*  HCT 29.5* 26.9*  PLT 414* 421*   BMET Recent Labs    10/12/18 0312 10/13/18 0257  NA 132* 134*  K 4.9 4.3  CL 100 101  CO2 23 25  GLUCOSE 243* 120*  BUN 31* 26*  CREATININE 1.41* 1.30*  CALCIUM 8.0* 7.7*    COAG Lab Results  Component Value Date   INR 1.2 10/11/2018   INR 1.06 04/01/2018   No results found for: PTT    I have interviewed and examined patient with PA and agree with assessment and plan above.   Jay Haskew C. Donzetta Matters, MD [facility] Office: [phone] Pager: [phone]

## 2018-10-13 NOTE — Evaluation (Signed)
Occupational Therapy Evaluation Patient Details Name: Nathan Cook MRN: [id number] DOB: [birth year] Today's Date: 10/13/2018    History of Present Illness Pt is a 85 yo male admitted for LLE ischemia, s/p L AKA on 10/11/18. 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   Clinical Impression   PTA patient reports independent with ADLs and mobility, admitted for above and limited by problem list below including generalized weakness, impaired balance, pain in L AKA and decreased activity tolerance.  Patient educated on precautions, safety, ADL compensatory techniques, recommendations, desensitization of L AKA and weight shifting techniques in chair. Patient able to complete UB ADLs with setup assist, LB ADLs with mod-max assist using lateral lean techniques, pt deferred transfer at this time but able to demonstrate scooting in chair with min-min guard assist.  Patient will benefit from continued OT services while admitted and after dc at CIR level in order to optimize independence and safety with ADLs/mobility. Will follow.      Follow Up Recommendations  CIR;Supervision/Assistance - 24 hour    Equipment Recommendations  Other (comment)(TBD at next venue of care)    Recommendations for Other Services       Precautions / Restrictions Precautions Precautions: Fall Restrictions Weight Bearing Restrictions: Yes LLE Weight Bearing: Non weight bearing      Mobility Bed Mobility               General bed mobility comments: OOB upon entry   Transfers                 General transfer comment: deferred, pt able to scoot hips in chair using BUEs with min guard assist with pad but declined to transition back to bed at this time     Balance Overall balance assessment: Needs assistance;History of Falls Sitting-balance support: Bilateral upper extremity supported;Feet supported Sitting balance-Leahy Scale:  Fair Sitting balance - Comments: min guard for safety while reaching towards R LE                                   ADL either performed or assessed with clinical judgement   ADL Overall ADL's : Needs assistance/impaired     Grooming: Set up;Sitting   Upper Body Bathing: Set up;Sitting   Lower Body Bathing: Moderate assistance;Sitting/lateral leans   Upper Body Dressing : Set up;Sitting   Lower Body Dressing: Moderate assistance;Sitting/lateral leans     Toilet Transfer Details (indicate cue type and reason): pt declined at this time            General ADL Comments: Pt limited by generalized weakness, impaired balance, and pain in L AKA; discussed comepsnatory techniques for ADLs (lateral leans), pt able to describe lateral transfers to therapist      Vision Baseline Vision/History: Wears glasses Wears Glasses: At all times Patient Visual Report: No change from baseline       Perception     Praxis      Pertinent Vitals/Pain Pain Assessment: Faces Faces Pain Scale: Hurts a little bit Pain Location: L LE  Pain Descriptors / Indicators: Sore Pain Intervention(s): Limited activity within patient's tolerance;Repositioned     Hand Dominance Right   Extremity/Trunk Assessment Upper Extremity Assessment Upper Extremity Assessment: Overall WFL for tasks assessed RUE Deficits / Details: Pt with history of RTC tear, difficulty with UE elevation >90* RUE Sensation: history of peripheral neuropathy(bilaterally )  Lower Extremity Assessment Lower Extremity Assessment: Defer to PT evaluation LLE Deficits / Details: s/p AKA    Cervical / Trunk Assessment Cervical / Trunk Assessment: Kyphotic;Other exceptions Cervical / Trunk Exceptions: forward head, cervical stiffness   Communication Communication Communication: Expressive difficulties   Cognition Arousal/Alertness: Awake/alert Behavior During Therapy: WFL for tasks assessed/performed Overall  Cognitive Status: Within Functional Limits for tasks assessed                                     General Comments  educated on desensatization to L AKA as pt reports phantom sensations     Exercises     Shoulder Instructions      Home Living Family/patient expects to be discharged to:: Other (Comment)([facility] living[facility] ) Living Arrangements: Spouse/significant other Available Help at Discharge: Family;Available 24 hours/day Type of Home: Independent living facility Home Access: Level entry     Home Layout: One level     Bathroom Shower/Tub: Occupational psychologist: Handicapped height     Home Equipment: Grab bars - tub/shower;Shower seat - built in          Prior Functioning/Environment Level of Independence: Independent        Comments: Used to enjoy painting, sketching; singing        OT Problem List: Decreased strength;Decreased activity tolerance;Impaired balance (sitting and/or standing);Decreased safety awareness;Decreased knowledge of use of DME or AE;Decreased knowledge of precautions;Decreased coordination;Pain      OT Treatment/Interventions: Self-care/ADL training;Therapeutic exercise;DME and/or AE instruction;Balance training;Patient/family education;Therapeutic activities    OT Goals(Current goals can be found in the care plan section) Acute Rehab OT Goals Patient Stated Goal: return home after rehab OT Goal Formulation: With patient Time For Goal Achievement: 10/27/18 Potential to Achieve Goals: Good  OT Frequency: Min 2X/week   Barriers to D/C:            Co-evaluation              AM-PAC OT "6 Clicks" Daily Activity     Outcome Measure Help from another person eating meals?: None Help from another person taking care of personal grooming?: None Help from another person toileting, which includes using toliet, bedpan, or urinal?: A Little Help from another person bathing (including washing,  rinsing, drying)?: A Lot Help from another person to put on and taking off regular upper body clothing?: None Help from another person to put on and taking off regular lower body clothing?: A Lot 6 Click Score: 19   End of Session Nurse Communication: Mobility status  Activity Tolerance: Patient tolerated treatment well Patient left: in chair;with call bell/phone within reach  OT Visit Diagnosis: Other abnormalities of gait and mobility (R26.89);Pain;Muscle weakness (generalized) (M62.81) Pain - Right/Left: Left Pain - part of body: Leg                Time: 1343-1403 OT Time Calculation (min): 20 min Charges:  OT General Charges $OT Visit: 1 Visit OT Evaluation $OT Eval Moderate Complexity: [place], OT Acute Rehabilitation Services Pager [phone] Office [phone]   Delight Stare 10/13/2018, 2:34 PM

## 2018-10-14 ENCOUNTER — Telehealth: Payer: Self-pay | Admitting: Vascular Surgery

## 2018-10-14 DIAGNOSIS — L8990 Pressure ulcer of unspecified site, unspecified stage: Secondary | ICD-10-CM

## 2018-10-14 LAB — GLUCOSE, CAPILLARY
Glucose-Capillary: 167 mg/dL — ABNORMAL HIGH (ref 70–99)
Glucose-Capillary: 243 mg/dL — ABNORMAL HIGH (ref 70–99)

## 2018-10-14 MED ORDER — OXYCODONE-ACETAMINOPHEN 5-325 MG PO TABS: 1.0000 | ORAL_TABLET | ORAL | 0 refills | Status: DC | PRN

## 2018-10-14 NOTE — TOC Initial Note (Signed)
Transition of Care [facility]) - Initial/Assessment Note    Patient Details  Name: Nathan Cook MRN: [id number] Date of Birth: 1921-08-22  Transition of Care [facility]) CM/SW Contact:    Vinie Sill, [place] Phone Number: 10/14/2018, 12:33 PM  Clinical Narrative:                 CSW visited with the patient at bedside. Patient was alert and oriented. CSW explained PT recommendations for ST rehab and inquire about CIR verses rehab at ALF. Patient states he prefer to go back to home and receive rehab there.   CSW called Valetta Fuller with [facility] and confirmed they have the information needed to re-admit the patient back to ALF.   Patient is realistic regarding therapy needs and expressed being hopeful for rehab at ALF.  Patient expressed understanding of CSW role and discharge process as well as medical condition. No questions/concerns about plan or treatment at this time.  Expected Discharge Plan: Assisted Living Barriers to Discharge: No Barriers Identified   Patient Goals and CMS Choice Patient states their goals for this hospitalization and ongoing recovery are:: to go home and get rehab there CMS Medicare.gov Compare Post Acute Care list provided to:: Patient(Patient is from ALF) Choice offered to / list presented to : Patient(patient is from ALF)  Expected Discharge Plan and Services Expected Discharge Plan: Assisted Living         Expected Discharge Date: 10/14/18                        Prior Living Arrangements/Services     Patient language and need for interpreter reviewed:: No Do you feel safe going back to the place where you live?: Yes      Need for Family Participation in Patient Care: Yes (Comment) Care giver support system in place?: Yes (comment)   Criminal Activity/Legal Involvement Pertinent to Current Situation/Hospitalization: No - Comment as needed  Activities of Daily Living Home Assistive Devices/Equipment: Shower chair with back, Walker (specify  type) ADL Screening (condition at time of admission) Patient's cognitive ability adequate to safely complete daily activities?: Yes Is the patient deaf or have difficulty hearing?: No Does the patient have difficulty seeing, even when wearing glasses/contacts?: No Does the patient have difficulty concentrating, remembering, or making decisions?: No Patient able to express need for assistance with ADLs?: Yes Does the patient have difficulty dressing or bathing?: Yes Independently performs ADLs?: No Does the patient have difficulty walking or climbing stairs?: Yes Weakness of Legs: Both Weakness of Arms/Hands: None  Permission Sought/Granted Permission sought to share information with : Facility Sport and exercise psychologist, Tourist information centre manager, Family Supports Permission granted to share information with : Yes, Verbal Permission Granted  Share Information with NAME: Gordon,[place]  Permission granted to share info w AGENCY: [place] granted to share info w Relationship: daughter   Permission granted to share info w Contact Information: [phone]  Emotional Assessment Appearance:: Appears stated age Attitude/Demeanor/Rapport: Engaged Affect (typically observed): Accepting, Appropriate, Pleasant Orientation: : Oriented to Self, Oriented to Place, Oriented to  Time, Oriented to Situation Alcohol / Substance Use: Not Applicable Psych Involvement: No (comment)  Admission diagnosis:  Limb ischemia [I99.8] Pain of left lower extremity [M79.605] Patient Active Problem List   Diagnosis Date Noted  . Pressure injury of skin 10/14/2018  . Critical lower limb ischemia 10/09/2018  . History of stroke with residual deficit 08/16/2018  . Non-healing open wound of toe 07/24/2018  .  Stasis dermatitis of both legs 06/10/2018  . Dysphagia 04/12/2018  . Paroxysmal atrial fibrillation (HCC)   . Stroke ([place]) 04/01/2018  . PAD (peripheral artery disease) ([place]) 03/20/2018  .  Generalized osteoarthritis 06/27/2017  . B12 deficiency 06/27/2017  . History of TIA (transient ischemic attack) 03/21/2017  . Vitamin D deficiency 03/21/2017  . Dry eyes, bilateral 03/21/2017  . CKD (chronic kidney disease) stage 3, GFR 30-59 ml/min (HCC) 03/21/2017  . Cervicalgia 01/04/2016  . Pain in joint, shoulder region 05/11/2015  . BPH (benign prostatic hyperplasia) 10/14/2014  . Hyperlipidemia LDL goal <70   . Benign hypertension with chronic kidney disease, stage III ([place])   . Controlled type 2 diabetes mellitus with stage 3 chronic kidney disease, without long-term current use of insulin ([place])   . Left knee pain 05/27/2014  . Brainstem infarct, acute ([place]) s/p IV tPA 11/06/2005  . History of colonic polyps 10/14/2003   PCP:  Virgie Dad, MD Pharmacy:   [facility] [address], [place] - [address] [place] [address] Phone: [phone] Fax: [phone]     Social Determinants of Health (SDOH) Interventions    Readmission Risk Interventions No flowsheet data found.

## 2018-10-14 NOTE — TOC Transition Note (Signed)
Transition of Care [facility]) - CM/SW Discharge Note   Patient Details  Name: Nathan Cook MRN: [id number] Date of Birth: 1922-01-30  Transition of Care [facility]) CM/SW Contact:  Vinie Sill, [place] Phone Number: 10/14/2018, 12:09 PM   Clinical Narrative:    Patient will DC to: [facility] Date: 10/14/18 Family Notified: [place], daughter  Transport By: Corey Harold at 1:30 pm  RN, patient, and facility notified of DC. Discharge Summary sent to facility. RN given number for report [phone]- ask to speak with his nurse. DC packet on chart. Ambulance transport requested for patient.   Clinical Social Worker signing off. Thurmond Butts, MSW, [facility] Clinical Social Worker [phone]     Final next level of care: Assisted Living Barriers to Discharge: No Barriers Identified   Patient Goals and CMS Choice Patient states their goals for this hospitalization and ongoing recovery are:: to go home and get rehab there CMS Medicare.gov Compare Post Acute Care list provided to:: Patient(Patient is from ALF) Choice offered to / list presented to : Patient(patient is from ALF)  Discharge Placement   Existing PASRR number confirmed : 10/14/18          Patient chooses bed at: [facility] Patient to be transferred to facility by: [place] Name of family member notified: Apolonio Schneiders, Daughter  Patient and family notified of of transfer: 10/14/18  Discharge Plan and Services                          Social Determinants of Health (SDOH) Interventions     Readmission Risk Interventions No flowsheet data found.

## 2018-10-14 NOTE — Progress Notes (Signed)
[facility] [place]  Subjective  - No new complaints.   Objective 122/65 78 97.8 F (36.6 C) (Oral) 18 97%  Intake/Output Summary (Last 24 hours) at 10/14/2018 0801 Last data filed at 10/14/2018 0020 Gross per 24 hour  Intake 270 ml  Output 801 ml  Net -531 ml   Left AKA dressing clean and dry without active bleeding.  Clean dry dressing changed. Heart RRR Lungs non labored breathing    Assessment/Planning: POD # 3 L AKA  Pending SNF verses CIR Stable disposition  On Eliquis   Roxy Horseman 10/14/2018 8:01 AM --  Laboratory Lab Results: Recent Labs    10/12/18 0312 10/13/18 0257  WBC 24.0* 16.6*  HGB 9.8* 8.7*  HCT 29.5* 26.9*  PLT 414* 421*   BMET Recent Labs    10/12/18 0312 10/13/18 0257  NA 132* 134*  K 4.9 4.3  CL 100 101  CO2 23 25  GLUCOSE 243* 120*  BUN 31* 26*  CREATININE 1.41* 1.30*  CALCIUM 8.0* 7.7*    COAG Lab Results  Component Value Date   INR 1.2 10/11/2018   INR 1.06 04/01/2018   No results found for: PTT

## 2018-10-14 NOTE — Progress Notes (Signed)
PT Cancellation Note  Patient Details Name: Nathan Cook MRN: [id number] DOB: 05-19-1922   Cancelled Treatment:    Reason Eval/Treat Not Completed: Other (comment)(Refused due to pt d/c today. Wants to rest. )   Denice Paradise 10/14/2018, 12:31 PM Arkin Imran,PT Acute Rehabilitation Services Pager:  [phone]  Office:  [phone]

## 2018-10-14 NOTE — Progress Notes (Signed)
Patient in a stable condition, discharge education reviewed with patient he verbalized understanding, iv removed, tele dc ccmd notified, patient belongings at bedside, report given to Ear[facility] F[facility], [facility] [employer]

## 2018-10-14 NOTE — Telephone Encounter (Signed)
sch appt lvm mld ltr 11/12/2018 1015am p/o MD

## 2018-10-14 NOTE — Telephone Encounter (Signed)
-----   Message from Ulyses Amor, [place] sent at 10/14/2018 10:58 AM EDT -----  S/P left BKA f/u with Dr. Carlis Abbott in 4 weeks

## 2018-10-14 NOTE — Discharge Instructions (Signed)

## 2018-10-14 NOTE — NC FL2 (Signed)
[place] LEVEL OF CARE SCREENING TOOL     IDENTIFICATION  Patient Name: Nathan Cook Birthdate: July 03, 1921 Sex: male Admission Date (Current Location): 10/09/2018  [facility] and [place] Number:  Herbalist and Address:  [facility], [place] [address], [place], [place] [address]      Provider Number: [id number]  Attending Physician Name and Address:  Serafina Mitchell, MD  Relative Name and Phone Number:  Carver Fila (daughter)- [phone]    Current Level of Care: Hospital Recommended Level of Care: [place] Prior Approval Number:    Date Approved/Denied: 04/03/18 PASRR Number: [id number]  Discharge Plan: SNF    Current Diagnoses: Patient Active Problem List   Diagnosis Date Noted  . Critical lower limb ischemia 10/09/2018  . History of stroke with residual deficit 08/16/2018  . Non-healing open wound of toe 07/24/2018  . Stasis dermatitis of both legs 06/10/2018  . Dysphagia 04/12/2018  . Paroxysmal atrial fibrillation (HCC)   . Stroke ([place]) 04/01/2018  . PAD (peripheral artery disease) ([place]) 03/20/2018  . Generalized osteoarthritis 06/27/2017  . B12 deficiency 06/27/2017  . History of TIA (transient ischemic attack) 03/21/2017  . Vitamin D deficiency 03/21/2017  . Dry eyes, bilateral 03/21/2017  . CKD (chronic kidney disease) stage 3, GFR 30-59 ml/min (HCC) 03/21/2017  . Cervicalgia 01/04/2016  . Pain in joint, shoulder region 05/11/2015  . BPH (benign prostatic hyperplasia) 10/14/2014  . Hyperlipidemia LDL goal <70   . Benign hypertension with chronic kidney disease, stage III ([place])   . Controlled type 2 diabetes mellitus with stage 3 chronic kidney disease, without long-term current use of insulin ([place])   . Left knee pain 05/27/2014  . Brainstem infarct, acute ([place]) s/p IV tPA 11/06/2005  . History of colonic polyps 10/14/2003    Orientation RESPIRATION BLADDER Height & Weight      Self, Time, Situation, Place  Normal Continent Weight: 64.9 kg Height:  5\' 9"  (175.3 cm)  BEHAVIORAL SYMPTOMS/MOOD NEUROLOGICAL BOWEL NUTRITION STATUS      Continent Diet  AMBULATORY STATUS COMMUNICATION OF NEEDS Skin   Total Care(s/p left AKA)   Surgical wounds                       Personal Care Assistance Level of Assistance  Bathing, Dressing Bathing Assistance: Limited assistance   Dressing Assistance: Limited assistance     Functional Limitations Info             SPECIAL CARE FACTORS FREQUENCY  PT (By licensed PT), OT (By licensed OT)     PT Frequency: 5 times a week OT Frequency: 5 times a week            Contractures Contractures Info: Not present    Additional Factors Info                  Current Medications (10/14/2018):  This is the current hospital active medication list Current Facility-Administered Medications  Medication Dose Route Frequency Provider Last Rate Last Dose  . acetaminophen (TYLENOL) tablet 325-650 mg  325-650 mg Oral Q4H PRN Ulyses Amor, PA-C   650 mg at 10/13/18 1013   Or  . acetaminophen (TYLENOL) suppository 325-650 mg  325-650 mg Rectal Q4H PRN Ulyses Amor, PA-C      . alum & mag hydroxide-simeth (MAALOX/MYLANTA) 200-200-20 MG/5ML suspension 15-30 mL  15-30 mL Oral Q2H PRN Ulyses Amor, PA-C      .  apixaban (ELIQUIS) tablet 5 mg  5 mg Oral BID Pat Patrick, RPH   5 mg at 10/14/18 0814  . ceFAZolin (ANCEF) IVPB 1 g/50 mL premix  1 g Intravenous Q12H Ulyses Amor, PA-C 100 mL/hr at 10/14/18 4818 1 g at 10/14/18 5631  . docusate sodium (COLACE) capsule 100 mg  100 mg Oral Daily Laurence Slate M, PA-C   100 mg at 10/14/18 4970  . gabapentin (NEURONTIN) capsule 100 mg  100 mg Oral QHS Laurence Slate M, PA-C   100 mg at 10/13/18 2142  . guaiFENesin-dextromethorphan (ROBITUSSIN DM) 100-10 MG/5ML syrup 15 mL  15 mL Oral Q4H PRN Laurence Slate M, PA-C      . hydrALAZINE (APRESOLINE) injection 5 mg  5 mg Intravenous  Q20 Min PRN Laurence Slate M, PA-C      . HYDROmorphone (DILAUDID) injection 0.5-1 mg  0.5-1 mg Intravenous Q2H PRN Laurence Slate M, PA-C      . insulin aspart (novoLOG) injection 0-9 Units  0-9 Units Subcutaneous TID WC Serafina Mitchell, MD   2 Units at 10/14/18 [id number]  . labetalol (NORMODYNE,TRANDATE) injection 10 mg  10 mg Intravenous Q10 min PRN Laurence Slate M, PA-C      . magnesium sulfate IVPB 2 g 50 mL  2 g Intravenous Daily PRN Laurence Slate M, PA-C      . metoprolol tartrate (LOPRESSOR) injection 2-5 mg  2-5 mg Intravenous Q2H PRN Laurence Slate M, PA-C      . metoprolol tartrate (LOPRESSOR) tablet 25 mg  25 mg Oral BID Laurence Slate M, PA-C   25 mg at 10/14/18 8588  . morphine 2 MG/ML injection 2 mg  2 mg Intravenous Q2H PRN Ulyses Amor, PA-C   2 mg at 10/11/18 5027  . ondansetron (ZOFRAN) injection 4 mg  4 mg Intravenous Q6H PRN Laurence Slate M, PA-C      . oxyCODONE (Oxy IR/ROXICODONE) immediate release tablet 5-10 mg  5-10 mg Oral Q4H PRN Laurence Slate M, PA-C      . oxyCODONE-acetaminophen (PERCOCET/ROXICET) 5-325 MG per tablet 1-2 tablet  1-2 tablet Oral Q4H PRN Ulyses Amor, PA-C   1 tablet at 10/13/18 2320  . pantoprazole (PROTONIX) EC tablet 40 mg  40 mg Oral Daily Laurence Slate M, PA-C   40 mg at 10/14/18 7412  . phenol (CHLORASEPTIC) mouth spray 1 spray  1 spray Mouth/Throat PRN Laurence Slate M, PA-C      . polyvinyl alcohol (LIQUIFILM TEARS) 1.4 % ophthalmic solution 1 drop  1 drop Both Eyes PRN Ulyses Amor, PA-C   1 drop at 10/09/18 2229  . potassium chloride SA (K-DUR) CR tablet 20-40 mEq  20-40 mEq Oral Daily PRN Laurence Slate M, PA-C      . potassium chloride SA (K-DUR,KLOR-CON) CR tablet 20-40 mEq  20-40 mEq Oral Once Laurence Slate M, PA-C      . simvastatin (ZOCOR) tablet 10 mg  10 mg Oral q1800 Laurence Slate M, PA-C   10 mg at 10/13/18 1707     Discharge Medications: Please see discharge summary for a list of discharge medications.  Relevant Imaging  Results:  Relevant Lab Results:   Additional Information SS# [id number],   Conley Delisle, Romeo Rabon, [place]

## 2018-10-14 NOTE — Progress Notes (Signed)
Inpatient Rehab Admissions:  Inpatient Rehab Consult received.  I met with pt at the bedside for rehabilitation assessment and to determine interest in program.   Pt has declined CIR and wants to return to [facility] for his therapy. CM aware.  AC will sign off at this time.   Please call if questions.   Jhonnie Garner, OTR/L  Rehab Admissions Coordinator  [phone] 10/14/2018 10:48 AM

## 2018-10-14 NOTE — Discharge Summary (Signed)
[facility] Discharge Summary   Patient ID:  Nathan Cook MRN: [id number] DOB/AGE: Feb 07, 1922 85 y.o.  Admit date: 10/09/2018 Discharge date: 10/14/2018 Date of Surgery: 10/11/2018 Surgeon: Surgeon(s): Marty Heck, MD  Admission Diagnosis: Limb ischemia [I99.8] Pain of left lower extremity [M79.605]  Discharge Diagnoses:  Limb ischemia [I99.8] Pain of left lower extremity [M79.605]  Secondary Diagnoses: Past Medical History:  Diagnosis Date  . BPH (benign prostatic hyperplasia) 10/14/2014  . Cervicalgia 01/04/2016  . Chronic kidney disease   . Contusion of left shoulder 05/27/14  . DM type 2 (diabetes mellitus, type 2) ([place])   . Dysphagia   . High blood pressure   . History of basal cell cancer 11/21/2005   Right temple  . History of colonic polyps 10/14/2003  . Hyperlipidemia   . Left knee pain 05/27/14  . Peripheral vascular disease ([place])   . SAH (subarachnoid hemorrhage) ([place]) 2009   TBI  . Stroke ([place])   . TIA (transient ischemic attack) 11/06/2005   Left eye pain. Slurred speech. Diaphoresis. No residual effects.     Procedure(s): Left  AMPUTATION ABOVE KNEE  Discharged Condition: stable  HPI: Nathan Cook is a 85 y.o. male, he has history of worsining left LE pain.  He was admitted for left LE amputation.     Hospital Course:  Nathan Cook is a 85 y.o. male is S/P  Procedure(s): Left  AMPUTATION ABOVE KNEE His incision has healed and he has good pain control.   Stable disposition  On Eliquis    Consults:  Treatment Team:  Serafina Mitchell, MD  Significant Diagnostic Studies: CBC Lab Results  Component Value Date   WBC 16.6 (H) 10/13/2018   HGB 8.7 (L) 10/13/2018   HCT 26.9 (L) 10/13/2018   MCV 101.1 (H) 10/13/2018   PLT 421 (H) 10/13/2018    BMET    Component Value Date/Time   NA 134 (L) 10/13/2018 0257   NA 139 12/27/2015   K 4.3 10/13/2018 0257   CL 101 10/13/2018 0257   CO2 25 10/13/2018 0257    GLUCOSE 120 (H) 10/13/2018 0257   BUN 26 (H) 10/13/2018 0257   BUN 21 12/27/2015   CREATININE 1.30 (H) 10/13/2018 0257   CREATININE 1.44 (H) 08/29/2018 0815   CALCIUM 7.7 (L) 10/13/2018 0257   GFRNONAA 47 (L) 10/13/2018 0257   GFRNONAA 41 (L) 08/29/2018 0815   GFRAA 54 (L) 10/13/2018 0257   GFRAA 48 (L) 08/29/2018 0815   COAG Lab Results  Component Value Date   INR 1.2 10/11/2018   INR 1.06 04/01/2018     Disposition:  Discharge to :Skilled nursing facility Discharge Instructions    Call MD for:  redness, tenderness, or signs of infection (pain, swelling, bleeding, redness, odor or green/yellow discharge around incision site)   Complete by:  As directed    Call MD for:  severe or increased pain, loss or decreased feeling  in affected limb(s)   Complete by:  As directed    Call MD for:  temperature >100.5   Complete by:  As directed    Resume previous diet   Complete by:  As directed      Allergies as of 10/14/2018      Reactions   Tape Other (See Comments)   TAPE WILL TEAR THE SKIN!!!!      Medication List    TAKE these medications   acetaminophen 650 MG CR tablet Commonly known as:  Tylenol 8  Hour Take 1 tablet (650 mg total) by mouth every 8 (eight) hours as needed for pain.   apixaban 5 MG Tabs tablet Commonly known as:  ELIQUIS Take 1 tablet (5 mg total) by mouth 2 (two) times daily.   CALCIUM 600+D3 PO Take 1 tablet by mouth daily.   Cholecalciferol 25 MCG (1000 UT) tablet Take 1,000 Units by mouth daily.   furosemide 40 MG tablet Commonly known as:  LASIX Take 1 tablet (40 mg total) by mouth daily as needed. Take for leg swelling. Take only for three days in a row.   gabapentin 100 MG capsule Commonly known as:  NEURONTIN TAKE ONE CAPSULE BY MOUTH EVERY NIGHT AT BEDTIME   losartan 25 MG tablet Commonly known as:  COZAAR Take 1 tablet (25 mg total) by mouth 2 (two) times daily.   metFORMIN 500 MG tablet Commonly known as:  GLUCOPHAGE Take 1  tablet (500 mg total) by mouth 2 (two) times daily with a meal.   metoprolol tartrate 25 MG tablet Commonly known as:  LOPRESSOR Take 1 tablet (25 mg total) by mouth 2 (two) times daily.   oxyCODONE-acetaminophen 5-325 MG tablet Commonly known as:  PERCOCET/ROXICET Take 1 tablet by mouth every 4 (four) hours as needed for moderate pain.   Propylene Glycol-Glycerin 1-0.3 % Soln Place 2 drops into both eyes as needed (dry eyes).   simvastatin 10 MG tablet Commonly known as:  ZOCOR Take 1 tablet (10 mg total) by mouth daily. What changed:  when to take this      Verbal and written Discharge instructions given to the patient. Wound care per Discharge AVS Follow-up Information    Marty Heck, MD Follow up in 4 week(s).   Specialty:  Vascular Surgery Contact information: [address] [place] [address] [phone]           Signed: Roxy Horseman 10/14/2018, 11:01 AM

## 2018-10-15 ENCOUNTER — Non-Acute Institutional Stay (SKILLED_NURSING_FACILITY): Payer: Medicare Other | Admitting: Internal Medicine

## 2018-10-15 DIAGNOSIS — N183 Chronic kidney disease, stage 3 unspecified: Secondary | ICD-10-CM

## 2018-10-15 DIAGNOSIS — E1122 Type 2 diabetes mellitus with diabetic chronic kidney disease: Secondary | ICD-10-CM | POA: Diagnosis not present

## 2018-10-15 DIAGNOSIS — Z89612 Acquired absence of left leg above knee: Secondary | ICD-10-CM

## 2018-10-15 DIAGNOSIS — I480 Paroxysmal atrial fibrillation: Secondary | ICD-10-CM

## 2018-10-15 DIAGNOSIS — I129 Hypertensive chronic kidney disease with stage 1 through stage 4 chronic kidney disease, or unspecified chronic kidney disease: Secondary | ICD-10-CM

## 2018-10-15 NOTE — Progress Notes (Signed)
Provider:  Veleta Miners  MD Location:  [facility] Room Number: 696 Place of Service:  SNF ([id number])  PCP: Virgie Dad, MD Patient Care Team: Virgie Dad, MD as PCP - General (Internal Medicine) Buford Dresser, MD as PCP - Cardiology (Cardiology) Ricard Dillon, MD as Consulting Physician (Internal Medicine)  Extended Emergency Contact Information Primary Emergency Contact: Gordon,[place] Address: [address]          [place], [place] [address] [name] of [place] Phone: [phone] Relation: Daughter  Code Status: DNR Goals of Care: Advanced Directive information Advanced Directives 10/15/2018  Does Patient Have a Medical Advance Directive? Yes  Type of Advance Directive Out of facility DNR (pink MOST or yellow form);Living will  Does patient want to make changes to medical advance directive? No - Patient declined  Copy of [place] in Chart? Yes - validated most recent copy scanned in chart (See row information)  Pre-existing out of facility DNR order (yellow form or pink MOST form) Yellow form placed in chart (order not valid for inpatient use)      Chief Complaint  Patient presents with  . New Admit To SNF    HPI: Patient is a 85 y.o. male seen today for admission to SNF  for therapy. He was admitted to the hospital from 04/15-04/20 for Left AKA.  Patient has h/o Hypertension, Hyperlipidemia, Diabetes mellitus, h/o Brain stem Infarct s/p TPA In 10/19,  Atrial Fibrillation on Eliquis since 10/19.,PAD   He was admitted electively to undergo Left AKA as he was having unremitting pain in his Left Feet with Chronic Wound. Patient had Non Healing Ulcer of his Left Great toe . He was followed by Vascular and he failed Revascularization. At that time patient was not interested in Amputation But over time he developed worsening of pain and he also got Maceration of his other toes.  He was seen in wound clinic .  But  His wound continued to get worse and then he also started having severe pain and was unable to walk.  Patient finally agreed to undergo the left AKA which he went on 04/17. He had uncomplicated postop course.  He states his pain is completely gone. He is now in SNF for therapy.  He will stay for 2 weeks in isolation unit. He is already able to transfer himself from bed to the wheelchair and able to go to  the bathroom with mild assist.  He is alert and oriented Denies any cough, fever or any shortness of breath Patient was living  in the independent apartment with his wife who recently had acute stroke and is in SNF for therapy.  He is planning to go to assisted living eventually with his wife.    Past Medical History:  Diagnosis Date  . BPH (benign prostatic hyperplasia) 10/14/2014  . Cervicalgia 01/04/2016  . Chronic kidney disease   . Contusion of left shoulder 05/27/14  . DM type 2 (diabetes mellitus, type 2) ([place])   . Dysphagia   . High blood pressure   . History of basal cell cancer 11/21/2005   Right temple  . History of colonic polyps 10/14/2003  . Hyperlipidemia   . Left knee pain 05/27/14  . Peripheral vascular disease ([place])   . SAH (subarachnoid hemorrhage) ([place]) 2009   TBI  . Stroke ([place])   . TIA (transient ischemic attack) 11/06/2005   Left eye pain. Slurred speech. Diaphoresis. No residual effects.  Past Surgical History:  Procedure Laterality Date  . ABDOMINAL AORTOGRAM W/LOWER EXTREMITY N/A 06/12/2018   Procedure: ABDOMINAL AORTOGRAM W/LOWER EXTREMITY;  Surgeon: Marty Heck, MD;  Location: [facility];  Service: Cardiovascular;  Laterality: N/A;  . Achilles tendon repair Left 1998   ruptured tendon  . AMPUTATION Left 10/11/2018   Procedure: Left  AMPUTATION ABOVE KNEE;  Surgeon: Marty Heck, MD;  Location: [facility];  Service: Vascular;  Laterality: Left;  . COLONOSCOPY  1992   Polyp removed  . COLONOSCOPY  1998   normal  . ORIF FEMUR  FRACTURE Right 1929  . PERIPHERAL VASCULAR INTERVENTION Left 06/12/2018   Procedure: PERIPHERAL VASCULAR INTERVENTION;  Surgeon: Marty Heck, MD;  Location: [facility];  Service: Cardiovascular;  Laterality: Left;  Attempted   . TONSILLECTOMY      reports that he quit smoking about 47 years ago. He has never used smokeless tobacco. He reports current alcohol use of about 1.0 - 2.0 standard drinks of alcohol per week. He reports that he does not use drugs. Social History   Socioeconomic History  . Marital status: Married    Spouse name: Not on file  . Number of children: Not on file  . Years of education: Not on file  . Highest education level: Not on file  Occupational History  . Occupation: retired Copy  . Financial resource strain: Not on file  . Food insecurity:    Worry: Not on file    Inability: Not on file  . Transportation needs:    Medical: Not on file    Non-medical: Not on file  Tobacco Use  . Smoking status: Former Smoker    Last attempt to quit: 06/26/1971    Years since quitting: 47.3  . Smokeless tobacco: Never Used  . Tobacco comment: 2 1/2 packs a day, until 1972  Substance and Sexual Activity  . Alcohol use: Yes    Alcohol/week: 1.0 - 2.0 standard drinks    Types: 1 - 2 Glasses of wine per week    Comment: Wine nightly  . Drug use: No  . Sexual activity: Not on file  Lifestyle  . Physical activity:    Days per week: Not on file    Minutes per session: Not on file  . Stress: Not on file  Relationships  . Social connections:    Talks on phone: Not on file    Gets together: Not on file    Attends religious service: Not on file    Active member of club or organization: Not on file    Attends meetings of clubs or organizations: Not on file    Relationship status: Not on file  . Intimate partner violence:    Fear of current or ex partner: Not on file    Emotionally abused: Not on file    Physically abused: Not on file     Forced sexual activity: Not on file  Other Topics Concern  . Not on file  Social History Narrative   Patient lives at [facility] since Dec 2015   Caffeine- Coffee   Married- Yes, 1951   [employer] 1943-46   House- Apartment with 2 people   Pets- No   Current/past profession- Press photographer   Exercise- Yes, walking   Living will- Yes   DNR- Yes   POA/HPOA- Yes       Functional Status Survey:    Family History  Problem  Relation Age of Onset  . Heart attack Mother   . Heart disease Mother   . Heart disease Father     Health Maintenance  Topic Date Due  . OPHTHALMOLOGY EXAM  07/07/2017  . FOOT EXAM  06/27/2018  . INFLUENZA VACCINE  01/25/2019  . HEMOGLOBIN A1C  03/01/2019  . TETANUS/TDAP  04/29/2027  . PNA vac Low Risk Adult  Completed    Allergies  Allergen Reactions  . Tape Other (See Comments)    TAPE WILL TEAR THE SKIN!!!!    Outpatient Encounter Medications as of 10/15/2018  Medication Sig  . acetaminophen (TYLENOL 8 HOUR) 650 MG CR tablet Take 1 tablet (650 mg total) by mouth every 8 (eight) hours as needed for pain.  Marland Kitchen apixaban (ELIQUIS) 5 MG TABS tablet Take 1 tablet (5 mg total) by mouth 2 (two) times daily.  . Calcium Carb-Cholecalciferol (CALCIUM 600+D3 PO) Take 1 tablet by mouth daily.  . Cholecalciferol 1000 UNITS tablet Take 1,000 Units by mouth daily.  . furosemide (LASIX) 40 MG tablet Take 40 mg by mouth daily as needed.  . gabapentin (NEURONTIN) 100 MG capsule TAKE ONE CAPSULE BY MOUTH EVERY NIGHT AT BEDTIME (Patient taking differently: Take 100 mg by mouth at bedtime. )  . losartan (COZAAR) 25 MG tablet Take 1 tablet (25 mg total) by mouth 2 (two) times daily.  . metFORMIN (GLUCOPHAGE) 500 MG tablet Take 1 tablet (500 mg total) by mouth 2 (two) times daily with a meal.  . metoprolol tartrate (LOPRESSOR) 25 MG tablet Take 1 tablet (25 mg total) by mouth 2 (two) times daily.  Marland Kitchen oxyCODONE-acetaminophen (PERCOCET/ROXICET) 5-325 MG tablet Take 1 tablet by  mouth every 4 (four) hours as needed for moderate pain.  Marland Kitchen Propylene Glycol-Glycerin 1-0.3 % SOLN Place 2 drops into both eyes as needed (dry eyes).   . simvastatin (ZOCOR) 10 MG tablet Take 1 tablet (10 mg total) by mouth daily. (Patient taking differently: Take 10 mg by mouth at bedtime. )  . [DISCONTINUED] furosemide (LASIX) 40 MG tablet Take 1 tablet (40 mg total) by mouth daily as needed. Take for leg swelling. Take only for three days in a row.   No facility-administered encounter medications on file as of 10/15/2018.     Review of Systems  Review of Systems  Constitutional: Negative for activity change, appetite change, chills, diaphoresis, fatigue and fever.  HENT: Negative for mouth sores, postnasal drip, rhinorrhea, sinus pain and sore throat.   Respiratory: Negative for apnea, cough, chest tightness, shortness of breath and wheezing.   Cardiovascular: Negative for chest pain, palpitations and leg swelling.  Gastrointestinal: Negative for abdominal distention, abdominal pain, constipation, diarrhea, nausea and vomiting.  Genitourinary: Negative for dysuria and frequency.  Musculoskeletal: Negative for arthralgias, joint swelling and myalgias.  Skin: Negative for rash.  Neurological: Negative for dizziness, syncope, weakness, light-headedness and numbness.  Psychiatric/Behavioral: Negative for behavioral problems, confusion and sleep disturbance.     Vitals:   10/15/18 1204  BP: 130/60  Pulse: 71  Resp: 18  Temp: 98.7 F (37.1 C)  SpO2: 94%   There is no height or weight on file to calculate BMI. Physical Exam Vitals signs reviewed.  Constitutional:      Appearance: Normal appearance.  HENT:     Head: Normocephalic.     Nose: Nose normal.     Mouth/Throat:     Mouth: Mucous membranes are moist.     Pharynx: Oropharynx is clear.  Neck:  Musculoskeletal: Neck supple.  Cardiovascular:     Rate and Rhythm: Normal rate and regular rhythm.     Heart sounds: Murmur  present.  Pulmonary:     Effort: Pulmonary effort is normal. No respiratory distress.     Breath sounds: Normal breath sounds. No wheezing or rales.  Abdominal:     General: Abdomen is flat. There is no distension.     Palpations: Abdomen is soft.     Tenderness: There is no abdominal tenderness. There is no guarding.  Musculoskeletal:        General: No swelling.     Comments: S/P AKA of Left leg Surgical incision is Clear and healing well. No Pain or discharge  Skin:    General: Skin is warm and dry.  Neurological:     Mental Status: He is alert and oriented to person, place, and time.     Comments: Walks with the walker with no recent Falls Has mild weakness in Right UE  Psychiatric:        Mood and Affect: Mood normal.        Behavior: Behavior normal.        Thought Content: Thought content normal.     Labs reviewed: Basic Metabolic Panel: Recent Labs    10/11/18 0306 10/11/18 1114 10/12/18 0312 10/13/18 0257  NA 134*  --  132* 134*  K 4.6  --  4.9 4.3  CL 101  --  100 101  CO2 26  --  23 25  GLUCOSE 194*  --  243* 120*  BUN 26*  --  31* 26*  CREATININE 1.45* 1.28* 1.41* 1.30*  CALCIUM 7.9*  --  8.0* 7.7*   Liver Function Tests: Recent Labs    04/01/18 1432 08/29/18 0815 10/09/18 1352 10/09/18 1920  AST 25 15 23 22   ALT 19 13 22 21   ALKPHOS 57  --  64 68  BILITOT 0.9 0.7 0.8 0.7  PROT 6.6 6.0* 6.3* 6.2*  ALBUMIN 3.7  --  2.5* 2.5*   No results for input(s): LIPASE, AMYLASE in the last 8760 hours. No results for input(s): AMMONIA in the last 8760 hours. CBC: Recent Labs    04/01/18 1432  08/29/18 0815  10/11/18 1114 10/12/18 0312 10/13/18 0257  WBC 8.9   < > 11.7*   < > 16.8* 24.0* 16.6*  NEUTROABS 6.3  --  8,471*  --   --   --   --   HGB 12.7*   < > 12.0*   < > 10.8* 9.8* 8.7*  HCT 38.4*   < > 34.5*   < > 32.4* 29.5* 26.9*  MCV 104.9*   < > 99.7   < > 100.6* 99.3 101.1*  PLT 317   < > 455*   < > 420* 414* 421*   < > = values in this  interval not displayed.   Cardiac Enzymes: Recent Labs    10/09/18 1352  CKTOTAL 72   BNP: Invalid input(s): POCBNP Lab Results  Component Value Date   HGBA1C 8.0 (H) 08/29/2018   No results found for: TSH Lab Results  Component Value Date   VITAMINB12 1,226 (H) 09/10/2017   No results found for: FOLATE No results found for: IRON, TIBC, FERRITIN  Imaging and Procedures obtained prior to SNF admission: No results found.  Assessment/Plan S/P AKA of Left leg Patient doing very well.  Working with therapy. His wound looks good and healing His pain is controlled on Tylenol. He  is planning to eventually go to assisted living after therapy LE edema Now resolved Will discontinue his lasix PRN for now Will monitor closely here Anemia with leucocytosis Started on iron Repeat CBC Diabetes mellitus type 2 His last A1c was 8 in 03/20 but was probably high due to infection We will continue Accu-Cheks Continue on metformin Neuropathy Continue on low-dose of Neurontin Paroxysmal A. fib Tolerating Eliquis On metoprolol  h/o  CVA Has recovered well Blood pressure is controlled On Eliquis CKD stage III Creatinine stable Repeat BMP Hyperlipidemia On Zocor Hypertension  On losartan and metoprolol  Family/ staff Communication:   Labs/tests ordered:BMP and CBC Total time spent in this patient care encounter was  45_  minutes; greater than 50% of the visit spent counseling patient and staff, reviewing records , Labs and coordinating care for problems addressed at this encounter.

## 2018-10-17 LAB — CBC AND DIFFERENTIAL
HCT: 28 — AB (ref 41–53)
Hemoglobin: 9.3 — AB (ref 13.5–17.5)
Platelets: 477 — AB (ref 150–399)
WBC: 13.3

## 2018-10-17 LAB — BASIC METABOLIC PANEL
BUN: 25 — AB (ref 4–21)
Creatinine: 1.1 (ref 0.6–1.3)
Glucose: 182
Potassium: 4.7 (ref 3.4–5.3)
Sodium: 136 — AB (ref 137–147)

## 2018-10-17 LAB — HEPATIC FUNCTION PANEL
ALT: 14 (ref 10–40)
AST: 22 (ref 14–40)
Alkaline Phosphatase: 67 (ref 25–125)
Bilirubin, Total: 0.4

## 2018-10-21 ENCOUNTER — Encounter: Payer: Self-pay | Admitting: Nurse Practitioner

## 2018-10-21 ENCOUNTER — Telehealth: Payer: Self-pay | Admitting: *Deleted

## 2018-10-21 ENCOUNTER — Non-Acute Institutional Stay (SKILLED_NURSING_FACILITY): Payer: Medicare Other | Admitting: Nurse Practitioner

## 2018-10-21 DIAGNOSIS — R410 Disorientation, unspecified: Secondary | ICD-10-CM | POA: Insufficient documentation

## 2018-10-21 DIAGNOSIS — S91101S Unspecified open wound of right great toe without damage to nail, sequela: Secondary | ICD-10-CM

## 2018-10-21 DIAGNOSIS — M792 Neuralgia and neuritis, unspecified: Secondary | ICD-10-CM | POA: Insufficient documentation

## 2018-10-21 DIAGNOSIS — I129 Hypertensive chronic kidney disease with stage 1 through stage 4 chronic kidney disease, or unspecified chronic kidney disease: Secondary | ICD-10-CM

## 2018-10-21 DIAGNOSIS — R195 Other fecal abnormalities: Secondary | ICD-10-CM | POA: Insufficient documentation

## 2018-10-21 DIAGNOSIS — N183 Chronic kidney disease, stage 3 (moderate): Secondary | ICD-10-CM

## 2018-10-21 DIAGNOSIS — D500 Iron deficiency anemia secondary to blood loss (chronic): Secondary | ICD-10-CM | POA: Insufficient documentation

## 2018-10-21 DIAGNOSIS — E1122 Type 2 diabetes mellitus with diabetic chronic kidney disease: Secondary | ICD-10-CM

## 2018-10-21 NOTE — Assessment & Plan Note (Signed)
Staff reported the patient's mentation is at his baseline, will update CBC/diff, CMP/eGFR, neuro check.

## 2018-10-21 NOTE — Assessment & Plan Note (Signed)
Controlled, continue Metformin 500mg  bid.

## 2018-10-21 NOTE — Assessment & Plan Note (Signed)
Slightly redness, denied pain, about a half of the toe nail broke off, nail bed exposed, no purulent drainage, will apply Bactroban oint bid x 2 weeks in setting of PAD, non healing wound. Observe.

## 2018-10-21 NOTE — Telephone Encounter (Signed)
I will see him and address his dtr's concerns.

## 2018-10-21 NOTE — Progress Notes (Addendum)
Location:  [facility] Room Number: [place]:  SNF (31) Provider:  Marlana Latus  NP  Virgie Dad, MD  Patient Care Team: Virgie Dad, MD as PCP - General (Internal Medicine) Buford Dresser, MD as PCP - Cardiology (Cardiology) Ricard Dillon, MD as Consulting Physician (Internal Medicine)  Extended Emergency Contact Information Primary Emergency Contact: Gordon,[place] Address: [address]          [place], [place] [address] [name] of [place] Phone: [phone] Mobile Phone: [phone] Relation: Daughter  Code Status:  DNR Goals of care: Advanced Directive information Advanced Directives 05/27/2020  Does Patient Have a Medical Advance Directive? Yes  Type of Paramedic of [place];Living will;Out of facility DNR (pink MOST or yellow form)  Does patient want to make changes to medical advance directive? No - Patient declined  Copy of [place] in Chart? Yes - validated most recent copy scanned in chart (See row information)  Pre-existing out of facility DNR order (yellow form or pink MOST form) Yellow form placed in chart (order not valid for inpatient use)     Chief Complaint  Patient presents with  . Acute Visit    C/o - loose stools    HPI:  Pt is a 85 y.o. male seen today for an acute visit for loose stools x1 day, reported the patient has episodes of confusion. Last Na 134, post op anemia, Hgb 8.7 10/13/18, on Fe qd.  HTN, blood pressure is controlled on Metoprolol '25mg'$  bid, Losartan '25mg'$  bid.  T2DM, on Metformin '500mg'$  bid. Neuropathy, on Gabapentin '100mg'$  qd.    Past Medical History:  Diagnosis Date  . BPH (benign prostatic hyperplasia) 10/14/2014  . Cervicalgia 01/04/2016  . Chronic kidney disease   . Contusion of left shoulder 05/27/14  . DM type 2 (diabetes mellitus, type 2) ([facility])   . Dysphagia   . High blood pressure   . History of basal cell cancer  11/21/2005   Right temple  . History of colonic polyps 10/14/2003  . Hyperlipidemia   . Left knee pain 05/27/14  . Peripheral neuropathic pain   . Peripheral vascular disease ([place] [facility])   . SAH (subarachnoid hemorrhage) ([facility]) 2009   TBI  . Stroke ([facility])   . TIA (transient ischemic attack) 11/06/2005   Left eye pain. Slurred speech. Diaphoresis. No residual effects.    Past Surgical History:  Procedure Laterality Date  . ABDOMINAL AORTOGRAM W/LOWER EXTREMITY N/A 06/12/2018   Procedure: ABDOMINAL AORTOGRAM W/LOWER EXTREMITY;  Surgeon: Marty Heck, MD;  Location: [facility];  Service: Cardiovascular;  Laterality: N/A;  . Achilles tendon repair Left 1998   ruptured tendon  . AMPUTATION Left 10/11/2018   Procedure: Left  AMPUTATION ABOVE KNEE;  Surgeon: Marty Heck, MD;  Location: [facility];  Service: Vascular;  Laterality: Left;  . AMPUTATION Right 02/07/2019   Procedure: AMPUTATION ABOVE KNEE;  Surgeon: Rosetta Posner, MD;  Location: [facility];  Service: Vascular;  Laterality: Right;  . COLONOSCOPY  1992   Polyp removed  . COLONOSCOPY  1998   normal  . ORIF FEMUR FRACTURE Right 1929  . PERIPHERAL VASCULAR INTERVENTION Left 06/12/2018   Procedure: PERIPHERAL VASCULAR INTERVENTION;  Surgeon: Marty Heck, MD;  Location: [facility];  Service: Cardiovascular;  Laterality: Left;  Attempted   . TONSILLECTOMY      Allergies  Allergen Reactions  . Tape Other (See Comments)  TAPE WILL TEAR THE SKIN!!!!    Outpatient Encounter Medications as of 10/21/2018  Medication Sig  . Propylene Glycol-Glycerin 1-0.3 % SOLN Place 2 drops into both eyes daily as needed (dry eyes).   . [DISCONTINUED] acetaminophen (TYLENOL 8 HOUR) 650 MG CR tablet Take 1 tablet (650 mg total) by mouth every 8 (eight) hours as needed for pain.  . [DISCONTINUED] apixaban (ELIQUIS) 5 MG TABS tablet Take 1 tablet (5 mg total) by mouth 2 (two) times daily.  . [DISCONTINUED] Calcium  Carb-Cholecalciferol (CALCIUM 600+D3 PO) Take 1 tablet by mouth daily.  . [DISCONTINUED] Cholecalciferol 1000 UNITS tablet Take 1,000 Units by mouth daily.  . [DISCONTINUED] ferrous sulfate 325 (65 FE) MG tablet Take 325 mg by mouth every Monday, Wednesday, and Friday.   . [DISCONTINUED] gabapentin (NEURONTIN) 100 MG capsule TAKE ONE CAPSULE BY MOUTH EVERY NIGHT AT BEDTIME  . [DISCONTINUED] losartan (COZAAR) 25 MG tablet Take 1 tablet (25 mg total) by mouth 2 (two) times daily. (Patient taking differently: Take 25 mg by mouth daily. )  . [DISCONTINUED] metFORMIN (GLUCOPHAGE) 500 MG tablet Take 1 tablet (500 mg total) by mouth 2 (two) times daily with a meal.  . [DISCONTINUED] metoprolol tartrate (LOPRESSOR) 25 MG tablet Take 1 tablet (25 mg total) by mouth 2 (two) times daily.  . [DISCONTINUED] oxyCODONE-acetaminophen (PERCOCET/ROXICET) 5-325 MG tablet Take 1 tablet by mouth every 4 (four) hours as needed for moderate pain.  . [DISCONTINUED] simvastatin (ZOCOR) 10 MG tablet Take 1 tablet (10 mg total) by mouth daily. (Patient taking differently: Take 10 mg by mouth at bedtime. )  . [DISCONTINUED] furosemide (LASIX) 40 MG tablet Take 40 mg by mouth daily as needed.   No facility-administered encounter medications on file as of 10/21/2018.    Review of Systems  Constitutional: Positive for appetite change and fatigue. Negative for activity change, diaphoresis and fever.  HENT: Positive for hearing loss. Negative for congestion and voice change.   Eyes: Negative for visual disturbance.  Respiratory: Negative for cough, shortness of breath and wheezing.   Gastrointestinal: Positive for diarrhea. Negative for abdominal distention, abdominal pain, constipation, nausea and vomiting.  Genitourinary: Negative for difficulty urinating, dysuria and urgency.  Musculoskeletal: Positive for gait problem.  Skin: Positive for wound. Negative for color change.       The right great toe nail is broke off,  slightly redness right great toe, nail bed is exposed.   Neurological: Positive for weakness. Negative for dizziness, speech difficulty and headaches.       RUE, muscle strength 5/5, but he stated its weaker than the LUE, difficulty picking up objects.   Psychiatric/Behavioral: Negative for agitation, behavioral problems, confusion, hallucinations and sleep disturbance. The patient is not nervous/anxious.     Immunization History  Administered Date(s) Administered  . DTaP 12/31/2012  . Influenza, High Dose Seasonal PF 04/04/2017, 04/10/2019  . Influenza,inj,Quad PF,6+ Mos 03/28/2018  . Influenza-Unspecified 04/10/2014, 03/25/2015, 04/06/2016, 03/30/2020  . Moderna Sars-Covid-2 Vaccination 06/30/2019, 07/28/2019  . PPD Test 04/29/2014  . Pneumococcal Conjugate-13 04/28/2017  . Pneumococcal-Unspecified 03/13/2011  . Td 12/24/2012  . Tdap 04/28/2017  . Zoster Recombinat (Shingrix) 12/14/2017   Pertinent  Health Maintenance Due  Topic Date Due  . FOOT EXAM  06/27/2018  . URINE MICROALBUMIN  03/14/2019  . OPHTHALMOLOGY EXAM  07/01/2020  . HEMOGLOBIN A1C  10/15/2020  . INFLUENZA VACCINE  Completed  . PNA vac Low Risk Adult  Completed   Fall Risk  10/02/2018 09/11/2018 08/28/2018 08/08/2018 08/07/2018  Falls in the past year? 0 0 0 0 0  Number falls in past yr: 0 0 0 - 0  Injury with Fall? 0 0 0 - 0  Risk for fall due to : - - - - -   Functional Status Survey:    Vitals:   10/21/18 1408  BP: (!) 154/90  Pulse: 84  Resp: 20  Temp: 98.3 F (36.8 C)  SpO2: 92%  Weight: 129 lb 12.8 oz (58.9 kg)  Height: 5' 8.5" (1.74 m)   Body mass index is 19.45 kg/m. Physical Exam Vitals signs reviewed.  Constitutional:      General: He is not in acute distress.    Appearance: Normal appearance. He is obese. He is not ill-appearing, toxic-appearing or diaphoretic.  HENT:     Head: Normocephalic and atraumatic.     Mouth/Throat:     Mouth: Mucous membranes are moist.  Eyes:     Extraocular  Movements: Extraocular movements intact.     Pupils: Pupils are equal, round, and reactive to light.  Neck:     Musculoskeletal: Normal range of motion and neck supple.  Cardiovascular:     Rate and Rhythm: Normal rate and regular rhythm.     Pulses:          Dorsalis pedis pulses are 1+ on the right side.     Heart sounds: Murmur present.  Pulmonary:     Effort: Pulmonary effort is normal.     Breath sounds: No wheezing, rhonchi or rales.  Abdominal:     General: There is no distension.     Palpations: Abdomen is soft.     Tenderness: There is no abdominal tenderness. There is no right CVA tenderness, left CVA tenderness, guarding or rebound.  Musculoskeletal:     Right lower leg: No edema.     Left lower leg: No edema.     Comments: Left AKA  Skin:    General: Skin is warm and dry.     Capillary Refill: Capillary refill takes 2 to 3 seconds.     Comments: The right great toe nail is broke off, slightly redness right great toe, nail bed is exposed.  Left AKA incision, intact wit staple closure, no s/s of incision cellulitis.   Neurological:     General: No focal deficit present.     Mental Status: He is alert and oriented to person, place, and time. Mental status is at baseline.     Comments: RUE weakness  Psychiatric:        Mood and Affect: Mood normal.        Behavior: Behavior normal.        Thought Content: Thought content normal.        Judgment: Judgment normal.     Labs reviewed: Recent Labs    10/31/19 0000 04/16/20 0000  NA 138 136*  K 4.5 4.5  CL 102 97*  CO2 29* 31*  BUN 21 20  CREATININE 1.2 1.2  CALCIUM 9.1  9.1 9.5   Recent Labs    10/31/19 0000 04/16/20 0000  AST 14 14  ALT 11 12  ALKPHOS 62 65  ALBUMIN 3.4* 3.6   Recent Labs    10/31/19 0000 04/16/20 0000  WBC 7.8 7.5  NEUTROABS  --  4,688.00  HGB 13.1* 13.0*  HCT 39* 39*  PLT 377 415*   Lab Results  Component Value Date   TSH 1.72 10/31/2019   Lab Results  Component Value  Date   HGBA1C 7.6 04/16/2020   Lab Results  Component Value Date   CHOL 160 04/16/2020   HDL 53 04/16/2020   LDLCALC 88 04/16/2020   TRIG 99 04/16/2020   CHOLHDL 2.6 08/29/2018    Significant Diagnostic Results in last 30 days:  No results found.  Assessment/Plan Loose stools x2 today, poor appetite, denied nausea, abd pain, or fever. Will update CBC/diff, obtain C-diff toxin A/B x3   Confusion Staff reported the patient's mentation is at his baseline, will update CBC/diff, CMP/eGFR, neuro check.   Blood loss anemia Post op, on Fe daily, last Hgb 8.7 10/13/18, update CBC/diff.   Benign hypertension with chronic kidney disease, stage III (HCC) Blood pressure is controlled, continue Metoprolol, Losartan, update CMP/eGFR  Controlled type 2 diabetes mellitus with stage 3 chronic kidney disease, without long-term current use of insulin (HCC) Controlled, continue Metformin '500mg'$  bid.   Neuropathic pain Felt left shin itching since the left AKA. Continue Gabapentin '100mg'$  qd.   Open wound of right great toe Slightly redness, denied pain, about a half of the toe nail broke off, nail bed exposed, no purulent drainage, will apply Bactroban oint bid x 2 weeks in setting of PAD, non healing wound. Observe.      Family/ staff Communication: plan of care reviewed with the patient, the patient's HPOA-daughter, and charge nurse.    Labs/tests ordered:  CBC/diff, CMP/eGFR, C Diff Toxin A/B x3   Time spend 35 minutes.

## 2018-10-21 NOTE — Telephone Encounter (Signed)
Patient's daughter called regarding her father un amputated leg is bleeding and wants some one to see him today, she stated that she is very concerned and is not getting the results from the nurse in the acuity. Daughter's name is [place] and she will like for you to call her [phone]

## 2018-10-21 NOTE — Assessment & Plan Note (Addendum)
x2 today, poor appetite, denied nausea, abd pain, or fever. Will update CBC/diff, obtain C-diff toxin A/B x3

## 2018-10-21 NOTE — Assessment & Plan Note (Signed)
Blood pressure is controlled, continue Metoprolol, Losartan, update CMP/eGFR

## 2018-10-21 NOTE — Assessment & Plan Note (Addendum)
Felt left shin itching since the left AKA. Continue Gabapentin 100mg  qd.

## 2018-10-21 NOTE — Assessment & Plan Note (Signed)
Post op, on Fe daily, last Hgb 8.7 10/13/18, update CBC/diff.

## 2018-10-22 ENCOUNTER — Other Ambulatory Visit: Payer: Self-pay | Admitting: Internal Medicine

## 2018-10-22 LAB — CBC AND DIFFERENTIAL
HCT: 33 — AB (ref 41–53)
Hemoglobin: 11.4 — AB (ref 13.5–17.5)
Platelets: 483 — AB (ref 150–399)
WBC: 9.5

## 2018-10-22 LAB — HEPATIC FUNCTION PANEL
ALT: 11 (ref 10–40)
AST: 16 (ref 14–40)
Alkaline Phosphatase: 69 (ref 25–125)
Bilirubin, Total: 0.6

## 2018-10-22 LAB — BASIC METABOLIC PANEL
BUN: 20 (ref 4–21)
Creatinine: 1 (ref 0.6–1.3)
Glucose: 132
Potassium: 4.5 (ref 3.4–5.3)
Sodium: 132 — AB (ref 137–147)

## 2018-10-23 ENCOUNTER — Non-Acute Institutional Stay (SKILLED_NURSING_FACILITY): Payer: Medicare Other | Admitting: Nurse Practitioner

## 2018-10-23 ENCOUNTER — Encounter: Payer: Self-pay | Admitting: Nurse Practitioner

## 2018-10-23 DIAGNOSIS — E1122 Type 2 diabetes mellitus with diabetic chronic kidney disease: Secondary | ICD-10-CM

## 2018-10-23 DIAGNOSIS — R634 Abnormal weight loss: Secondary | ICD-10-CM | POA: Insufficient documentation

## 2018-10-23 DIAGNOSIS — N183 Chronic kidney disease, stage 3 (moderate): Secondary | ICD-10-CM

## 2018-10-23 NOTE — Assessment & Plan Note (Addendum)
Near #10Ibs weight loss in one week, admitted poor appetite, will adding Mirtazapine 7.5mg  qsh4/28/20 wbc9.5, Hgb 11.4, plt 483, neutrophis68.2, Na132, K 4.5, Bun 20, creat 1.02,  update TSH 10/24/18 the patient declined Mirtazapine.

## 2018-10-23 NOTE — Progress Notes (Signed)
[facility] Room Number: 509 Place of Service:  SNF (31) Provider: Lennie Odor Shernell Saldierna NP  Virgie Dad, MD  Patient Care Team: Virgie Dad, MD as PCP - General (Internal Medicine) Buford Dresser, MD as PCP - Cardiology (Cardiology) Ricard Dillon, MD as Consulting Physician (Internal Medicine)  Extended Emergency Contact Information Primary Emergency Contact: Gordon,[place] Address: [address]          [place], [place] [address] [name] of [place] Phone: [phone] Relation: Daughter  Code Status: DNR Goals of care: Advanced Directive information Advanced Directives 10/15/2018  Does Patient Have a Medical Advance Directive? Yes  Type of Advance Directive Out of facility DNR (pink MOST or yellow form);Living will  Does patient want to make changes to medical advance directive? No - Patient declined  Copy of [place] in Chart? Yes - validated most recent copy scanned in chart (See row information)  Pre-existing out of facility DNR order (yellow form or pink MOST form) Yellow form placed in chart (order not valid for inpatient use)     Chief Complaint  Patient presents with  . Acute Visit    weight loss    HPI:  Pt is a 85 y.o. male seen today for an acute visit for weight loss, # 129.8Ibs  10/16/18,  #121.2Ibs 10/23/18. The patient's diarrhea is resolved, C-diff Toxin A/B negative. He admitted his appetite is poor, especially dislike the food served here. He denied abd pain, nausea, vomiting. T2DM blood sugar is controlled on Metformin 500mg  bid, last Hgb a1c 8.0.    Past Medical History:  Diagnosis Date  . BPH (benign prostatic hyperplasia) 10/14/2014  . Cervicalgia 01/04/2016  . Chronic kidney disease   . Contusion of left shoulder 05/27/14  . DM type 2 (diabetes mellitus, type 2) ([facility])   . Dysphagia   . High blood pressure   . History of basal cell cancer 11/21/2005   Right temple  . History of colonic polyps  10/14/2003  . Hyperlipidemia   . Left knee pain 05/27/14  . Peripheral vascular disease ([facility])   . SAH (subarachnoid hemorrhage) ([facility]) 2009   TBI  . Stroke ([facility])   . TIA (transient ischemic attack) 11/06/2005   Left eye pain. Slurred speech. Diaphoresis. No residual effects.    Past Surgical History:  Procedure Laterality Date  . ABDOMINAL AORTOGRAM W/LOWER EXTREMITY N/A 06/12/2018   Procedure: ABDOMINAL AORTOGRAM W/LOWER EXTREMITY;  Surgeon: Marty Heck, MD;  Location: [facility];  Service: Cardiovascular;  Laterality: N/A;  . Achilles tendon repair Left 1998   ruptured tendon  . AMPUTATION Left 10/11/2018   Procedure: Left  AMPUTATION ABOVE KNEE;  Surgeon: Marty Heck, MD;  Location: [facility];  Service: Vascular;  Laterality: Left;  . COLONOSCOPY  1992   Polyp removed  . COLONOSCOPY  1998   normal  . ORIF FEMUR FRACTURE Right 1929  . PERIPHERAL VASCULAR INTERVENTION Left 06/12/2018   Procedure: PERIPHERAL VASCULAR INTERVENTION;  Surgeon: Marty Heck, MD;  Location: [facility];  Service: Cardiovascular;  Laterality: Left;  Attempted   . TONSILLECTOMY      Allergies  Allergen Reactions  . Tape Other (See Comments)    TAPE WILL TEAR THE SKIN!!!!    Allergies as of 10/23/2018      Reactions   Tape Other (See Comments)   TAPE WILL TEAR THE SKIN!!!!      Medication List  Accurate as of October 23, 2018 11:59 PM. Always use your most recent med list.        acetaminophen 650 MG CR tablet Commonly known as:  Tylenol 8 Hour Take 1 tablet (650 mg total) by mouth every 8 (eight) hours as needed for pain.   apixaban 5 MG Tabs tablet Commonly known as:  ELIQUIS Take 1 tablet (5 mg total) by mouth 2 (two) times daily.   CALCIUM 600+D3 PO Take 1 tablet by mouth daily.   Cholecalciferol 25 MCG (1000 UT) tablet Take 1,000 Units by mouth daily.   ferrous sulfate 325 (65 FE) MG tablet Take 325 mg by mouth daily with breakfast. Take 1  tablet on Monday, Wednesday and Friday.   gabapentin 100 MG capsule Commonly known as:  NEURONTIN TAKE ONE CAPSULE BY MOUTH EVERY NIGHT AT BEDTIME   losartan 25 MG tablet Commonly known as:  COZAAR Take 1 tablet (25 mg total) by mouth 2 (two) times daily.   metFORMIN 500 MG tablet Commonly known as:  GLUCOPHAGE Take 1 tablet (500 mg total) by mouth 2 (two) times daily with a meal.   metoprolol tartrate 25 MG tablet Commonly known as:  LOPRESSOR Take 1 tablet (25 mg total) by mouth 2 (two) times daily.   mupirocin ointment 2 % Commonly known as:  BACTROBAN Apply 1 application topically 2 (two) times daily. Apply to R great toe nail bed x 2 weeks.   oxyCODONE-acetaminophen 5-325 MG tablet Commonly known as:  PERCOCET/ROXICET Take 1 tablet by mouth every 4 (four) hours as needed for moderate pain.   Propylene Glycol-Glycerin 1-0.3 % Soln Place 2 drops into both eyes as needed (dry eyes).   simvastatin 10 MG tablet Commonly known as:  ZOCOR Take 1 tablet (10 mg total) by mouth daily.       Review of Systems  Constitutional: Positive for appetite change. Negative for activity change, chills, diaphoresis, fatigue and fever.  HENT: Positive for hearing loss. Negative for congestion and voice change.   Eyes: Negative for visual disturbance.  Respiratory: Negative for cough, shortness of breath and wheezing.   Cardiovascular: Positive for leg swelling. Negative for chest pain and palpitations.  Gastrointestinal: Negative for abdominal distention, abdominal pain, constipation, diarrhea, nausea and vomiting.  Genitourinary: Negative for difficulty urinating, dysuria and urgency.  Musculoskeletal: Positive for gait problem.       Left AKA  Skin: Positive for wound. Negative for color change.       Right great toe nail a half broken, mild erythematous.   Neurological: Negative for dizziness, speech difficulty and weakness.       Memory lapses.   Psychiatric/Behavioral: Negative  for agitation, behavioral problems, hallucinations and sleep disturbance. The patient is not nervous/anxious.     Immunization History  Administered Date(s) Administered  . DTaP 12/31/2012  . Influenza, High Dose Seasonal PF 04/04/2017  . Influenza,inj,Quad PF,6+ Mos 03/28/2018  . Influenza-Unspecified 04/10/2014, 03/25/2015, 04/06/2016  . PPD Test 04/29/2014  . Pneumococcal Conjugate-13 04/28/2017  . Pneumococcal-Unspecified 03/13/2011  . Tdap 04/28/2017  . Zoster Recombinat (Shingrix) 12/14/2017   Pertinent  Health Maintenance Due  Topic Date Due  . OPHTHALMOLOGY EXAM  07/07/2017  . FOOT EXAM  06/27/2018  . INFLUENZA VACCINE  01/25/2019  . HEMOGLOBIN A1C  03/01/2019  . PNA vac Low Risk Adult  Completed   Fall Risk  10/02/2018 09/11/2018 08/28/2018 08/08/2018 08/07/2018  Falls in the past year? 0 0 0 0 0  Number falls in past yr:  0 0 0 - 0  Injury with Fall? 0 0 0 - 0  Risk for fall due to : - - - - -   Functional Status Survey:    Vitals:   10/23/18 1624  BP: 140/78  Pulse: 68  Resp: 18  Temp: 98.5 F (36.9 C)  SpO2: 96%  Weight: 121 lb (54.9 kg)   Body mass index is 18.13 kg/m. Physical Exam Vitals signs and nursing note reviewed.  Constitutional:      Appearance: Normal appearance.  HENT:     Head: Normocephalic and atraumatic.     Nose: Nose normal.     Mouth/Throat:     Mouth: Mucous membranes are moist.  Eyes:     Extraocular Movements: Extraocular movements intact.     Conjunctiva/sclera: Conjunctivae normal.     Pupils: Pupils are equal, round, and reactive to light.  Neck:     Musculoskeletal: Normal range of motion and neck supple.  Cardiovascular:     Rate and Rhythm: Normal rate and regular rhythm.     Heart sounds: Murmur present.  Pulmonary:     Effort: Pulmonary effort is normal.     Breath sounds: No wheezing, rhonchi or rales.  Abdominal:     General: There is no distension.     Palpations: Abdomen is soft.     Tenderness: There is no  abdominal tenderness. There is no right CVA tenderness, left CVA tenderness, guarding or rebound.  Musculoskeletal:     Right lower leg: Edema present.     Comments: S/p AKA left, trace edema RLE  Skin:    General: Skin is warm.     Findings: Erythema present.     Comments: Right great toe a park of the nail is broken off, erythematous surrounding area.   Neurological:     General: No focal deficit present.     Mental Status: He is alert. Mental status is at baseline.     Cranial Nerves: No cranial nerve deficit.     Motor: No weakness.     Coordination: Coordination normal.     Gait: Gait abnormal.     Comments: Oriented to person and place.   Psychiatric:        Mood and Affect: Mood normal.        Behavior: Behavior normal.     Labs reviewed: Recent Labs    10/11/18 0306  10/12/18 0312 10/13/18 0257 10/17/18  NA 134*  --  132* 134* 136*  K 4.6  --  4.9 4.3 4.7  CL 101  --  100 101 103  CO2 26  --  23 25 29   GLUCOSE 194*  --  243* 120*  --   BUN 26*  --  31* 26* 25*  CREATININE 1.45*   < > 1.41* 1.30* 1.1  CALCIUM 7.9*  --  8.0* 7.7* 8.0   < > = values in this interval not displayed.   Recent Labs    08/29/18 0815 10/09/18 1352 10/09/18 1920 10/17/18  AST 15 23 22 22   ALT 13 22 21 14   ALKPHOS  --  64 68 67  BILITOT 0.7 0.8 0.7  --   PROT 6.0* 6.3* 6.2* 4.6  ALBUMIN  --  2.5* 2.5* 2.2   Recent Labs    04/01/18 1432  08/29/18 0815  10/11/18 1114 10/12/18 0312 10/13/18 0257 10/17/18  WBC 8.9   < > 11.7*   < > 16.8* 24.0* 16.6* 13.3  NEUTROABS 6.3  --  36,471*  --   --   --   --   --   HGB 12.7*   < > 12.0*   < > 10.8* 9.8* 8.7* 9.3*  HCT 38.4*   < > 34.5*   < > 32.4* 29.5* 26.9* 28*  MCV 104.9*   < > 99.7   < > 100.6* 99.3 101.1*  --   PLT 317   < > 455*   < > 420* 414* 421* 477*   < > = values in this interval not displayed.   No results found for: TSH Lab Results  Component Value Date   HGBA1C 8.0 (H) 08/29/2018   Lab Results  Component Value  Date   CHOL 126 08/29/2018   HDL 49 08/29/2018   LDLCALC 59 08/29/2018   TRIG 95 08/29/2018   CHOLHDL 2.6 08/29/2018    Significant Diagnostic Results in last 30 days:  No results found.  Assessment/Plan: Weight loss Near #10Ibs weight loss in one week, admitted poor appetite, will adding Mirtazapine 7.5mg  qsh4/28/20 wbc9.5, Hgb 11.4, plt 483, neutrophis68.2, Na132, K 4.5, Bun 20, creat 1.02,  update TSH 10/24/18 the patient declined Mirtazapine.   Controlled type 2 diabetes mellitus with stage 3 chronic kidney disease, without long-term current use of insulin (HCC) Controlled, am CBG in 140s, continue Metformin 500mg  bid. Monitor blood sugar.     Family/ staff Communication: plan of care reviewed with the patient and charge nurse.   Labs/tests ordered: TSH  Time spend 25 minutes.

## 2018-10-23 NOTE — Assessment & Plan Note (Signed)
Controlled, am CBG in 140s, continue Metformin 500mg  bid. Monitor blood sugar.

## 2018-10-24 ENCOUNTER — Other Ambulatory Visit: Payer: Self-pay | Admitting: *Deleted

## 2018-10-24 LAB — COMPLETE METABOLIC PANEL WITH GFR
Albumin: 2.2
Calcium: 8
Carbon Dioxide, Total: 29
Chloride: 103
EGFR (Non-African Amer.): 57
Globulin: 2.4
Total Protein: 4.6 g/dL

## 2018-10-24 LAB — TSH: TSH: 1.69 (ref 0.41–5.90)

## 2018-11-05 ENCOUNTER — Other Ambulatory Visit: Payer: Self-pay | Admitting: Internal Medicine

## 2018-11-08 ENCOUNTER — Encounter: Payer: Self-pay | Admitting: Internal Medicine

## 2018-11-08 ENCOUNTER — Non-Acute Institutional Stay (SKILLED_NURSING_FACILITY): Payer: Medicare Other | Admitting: Internal Medicine

## 2018-11-08 DIAGNOSIS — N183 Chronic kidney disease, stage 3 (moderate): Secondary | ICD-10-CM

## 2018-11-08 DIAGNOSIS — I739 Peripheral vascular disease, unspecified: Secondary | ICD-10-CM | POA: Diagnosis not present

## 2018-11-08 DIAGNOSIS — E1122 Type 2 diabetes mellitus with diabetic chronic kidney disease: Secondary | ICD-10-CM

## 2018-11-08 DIAGNOSIS — I480 Paroxysmal atrial fibrillation: Secondary | ICD-10-CM

## 2018-11-08 DIAGNOSIS — L03115 Cellulitis of right lower limb: Secondary | ICD-10-CM

## 2018-11-08 NOTE — Progress Notes (Signed)
Location:  [facility] Room Number: 35/A Place of Service:  SNF (31) Provider:Samuele Storey L.MD   Virgie Dad, MD  Patient Care Team: Virgie Dad, MD as PCP - General (Internal Medicine) Buford Dresser, MD as PCP - Cardiology (Cardiology) Ricard Dillon, MD as Consulting Physician (Internal Medicine)  Extended Emergency Contact Information Primary Emergency Contact: Gordon,[place] Address: [address]          [place], [place] [address] [name] of [place] Phone: [phone] Relation: Daughter  Code Status: DNR Goals of care: Advanced Directive information Advanced Directives 11/08/2018  Does Patient Have a Medical Advance Directive? Yes  Type of Paramedic of [place];Living will  Does patient want to make changes to medical advance directive? No - Patient declined  Copy of [place] in Chart? Yes - validated most recent copy scanned in chart (See row information)  Pre-existing out of facility DNR order (yellow form or pink MOST form) Yellow form placed in chart (order not valid for inpatient use)     Chief Complaint  Patient presents with  . Medical Management of Chronic Issues    Routine visit     HPI:  Pt is a 85 y.o. male seen today for medical management of chronic diseases.   Patient has h/o Hypertension, Hyperlipidemia, Diabetes mellitus, h/o Brain stem Infarct s/p TPA In 10/19,  Atrial Fibrillation on Eliquis since 10/19.,PAD   He was admitted electively to undergo Left AKA as he was having unremitting pain in his Left Feet with Chronic Wound.Patient finally agreed to undergo the left AKA which he went on 04/17. He had uncomplicated postop course He is now in SNF for therapy. He is doing well. Working good with therapy. No Pain Issues. But there is a new ares in his Right great Toe which is worrisome.He also had redness around Great toe.Which he says usually goes away but  today seems to be worse. It was also tender to touch. No Fever or chills. His Sump looks good and healing well    Past Medical History:  Diagnosis Date  . BPH (benign prostatic hyperplasia) 10/14/2014  . Cervicalgia 01/04/2016  . Chronic kidney disease   . Contusion of left shoulder 05/27/14  . DM type 2 (diabetes mellitus, type 2) ([place])   . Dysphagia   . High blood pressure   . History of basal cell cancer 11/21/2005   Right temple  . History of colonic polyps 10/14/2003  . Hyperlipidemia   . Left knee pain 05/27/14  . Peripheral vascular disease ([place])   . SAH (subarachnoid hemorrhage) ([place]) 2009   TBI  . Stroke ([place])   . TIA (transient ischemic attack) 11/06/2005   Left eye pain. Slurred speech. Diaphoresis. No residual effects.    Past Surgical History:  Procedure Laterality Date  . ABDOMINAL AORTOGRAM W/LOWER EXTREMITY N/A 06/12/2018   Procedure: ABDOMINAL AORTOGRAM W/LOWER EXTREMITY;  Surgeon: Marty Heck, MD;  Location: [facility];  Service: Cardiovascular;  Laterality: N/A;  . Achilles tendon repair Left 1998   ruptured tendon  . AMPUTATION Left 10/11/2018   Procedure: Left  AMPUTATION ABOVE KNEE;  Surgeon: Marty Heck, MD;  Location: [facility];  Service: Vascular;  Laterality: Left;  . COLONOSCOPY  1992   Polyp removed  . COLONOSCOPY  1998   normal  . ORIF FEMUR FRACTURE Right 1929  . PERIPHERAL VASCULAR INTERVENTION Left 06/12/2018   Procedure: PERIPHERAL VASCULAR INTERVENTION;  Surgeon:  Marty Heck, MD;  Location: [facility];  Service: Cardiovascular;  Laterality: Left;  Attempted   . TONSILLECTOMY      Allergies  Allergen Reactions  . Tape Other (See Comments)    TAPE WILL TEAR THE SKIN!!!!    Outpatient Encounter Medications as of 11/08/2018  Medication Sig  . acetaminophen (TYLENOL 8 HOUR) 650 MG CR tablet Take 1 tablet (650 mg total) by mouth every 8 (eight) hours as needed for pain.  Marland Kitchen apixaban (ELIQUIS) 5 MG TABS  tablet Take 1 tablet (5 mg total) by mouth 2 (two) times daily.  . Calcium Carb-Cholecalciferol (CALCIUM 600+D3 PO) Take 1 tablet by mouth daily.  . Cholecalciferol 1000 UNITS tablet Take 1,000 Units by mouth daily.  . ferrous sulfate 325 (65 FE) MG tablet Take 325 mg by mouth daily with breakfast. Take 1 tablet on Monday, Wednesday and Friday.  . gabapentin (NEURONTIN) 100 MG capsule TAKE ONE CAPSULE BY MOUTH EVERY NIGHT AT BEDTIME  . losartan (COZAAR) 25 MG tablet Take 1 tablet (25 mg total) by mouth 2 (two) times daily.  . metFORMIN (GLUCOPHAGE) 500 MG tablet Take 1 tablet (500 mg total) by mouth 2 (two) times daily with a meal.  . metoprolol tartrate (LOPRESSOR) 25 MG tablet Take 1 tablet (25 mg total) by mouth 2 (two) times daily.  . Nutritional Supplements (BOOST GLUCOSE CONTROL) LIQD Take 237 mLs by mouth 2 (two) times a day. Twice a day between meals  . oxyCODONE-acetaminophen (PERCOCET/ROXICET) 5-325 MG tablet Take 1 tablet by mouth every 4 (four) hours as needed for moderate pain.  Marland Kitchen Propylene Glycol-Glycerin 1-0.3 % SOLN Place 2 drops into both eyes as needed (dry eyes).   . simvastatin (ZOCOR) 10 MG tablet TAKE ONE TABLET BY MOUTH DAILY  . zinc oxide 20 % ointment Apply 1 application topically as needed for irritation. Apply to buttocks after every incontinent episode and as needed for redness   No facility-administered encounter medications on file as of 11/08/2018.     Review of Systems  Review of Systems  Constitutional: Negative for activity change, appetite change, chills, diaphoresis, fatigue and fever.  HENT: Negative for mouth sores, postnasal drip, rhinorrhea, sinus pain and sore throat.   Respiratory: Negative for apnea, cough, chest tightness, shortness of breath and wheezing.   Cardiovascular: Negative for chest pain, palpitations and leg swelling.  Gastrointestinal: Negative for abdominal distention, abdominal pain, constipation, diarrhea, nausea and vomiting.   Genitourinary: Negative for dysuria and frequency.  Musculoskeletal: Negative for arthralgias, joint swelling and myalgias.  Skin: Negative for rash.  Neurological: Negative for dizziness, syncope, weakness, light-headedness and numbness.  Psychiatric/Behavioral: Negative for behavioral problems, confusion and sleep disturbance.     Immunization History  Administered Date(s) Administered  . DTaP 12/31/2012  . Influenza, High Dose Seasonal PF 04/04/2017  . Influenza,inj,Quad PF,6+ Mos 03/28/2018  . Influenza-Unspecified 04/10/2014, 03/25/2015, 04/06/2016  . PPD Test 04/29/2014  . Pneumococcal Conjugate-13 04/28/2017  . Pneumococcal-Unspecified 03/13/2011  . Tdap 04/28/2017  . Zoster Recombinat (Shingrix) 12/14/2017   Pertinent  Health Maintenance Due  Topic Date Due  . OPHTHALMOLOGY EXAM  07/07/2017  . FOOT EXAM  06/27/2018  . INFLUENZA VACCINE  01/25/2019  . HEMOGLOBIN A1C  03/01/2019  . PNA vac Low Risk Adult  Completed   Fall Risk  10/02/2018 09/11/2018 08/28/2018 08/08/2018 08/07/2018  Falls in the past year? 0 0 0 0 0  Number falls in past yr: 0 0 0 - 0  Injury with Fall?  0 0 0 - 0  Risk for fall due to : - - - - -   Functional Status Survey:    Vitals:   11/08/18 0903  BP: 110/70  Pulse: 76  Resp: 18  Temp: (!) 97.1 F (36.2 C)  SpO2: 94%  Weight: 122 lb 9.6 oz (55.6 kg)  Height: 5\' 8"  (1.727 m)   Body mass index is 18.64 kg/m. Physical Exam  Constitutional: Oriented to person, place, and time. Well-developed and well-nourished.  HENT:  Head: Normocephalic.  Mouth/Throat: Oropharynx is clear and moist.  Eyes: Pupils are equal, round, and reactive to light.  Neck: Neck supple.  Cardiovascular: Normal rate and normal heart sounds.  Positive for  murmur heard. Pulmonary/Chest: Effort normal and breath sounds normal. No respiratory distress. No wheezes. She has no rales.  Abdominal: Soft. Bowel sounds are normal. No distension. There is no tenderness. There is  no rebound.  Musculoskeletal: No edema. Redness around Great toe with a small ulcer  His other toes are also mildly red  Lymphadenopathy: none Neurological: Alert and oriented to person, place, and time. S/P Left AKA Skin: Skin is warm and dry.  Psychiatric: Normal mood and affect. Behavior is normal. Thought content normal.    Labs reviewed: Recent Labs    10/11/18 0306  10/12/18 0312 10/13/18 0257 10/17/18  NA 134*  --  132* 134* 136*  K 4.6  --  4.9 4.3 4.7  CL 101  --  100 101 103  CO2 26  --  23 25 29   GLUCOSE 194*  --  243* 120*  --   BUN 26*  --  31* 26* 25*  CREATININE 1.45*   < > 1.41* 1.30* 1.1  CALCIUM 7.9*  --  8.0* 7.7* 8.0   < > = values in this interval not displayed.   Recent Labs    08/29/18 0815 10/09/18 1352 10/09/18 1920 10/17/18  AST 15 23 22 22   ALT 13 22 21 14   ALKPHOS  --  64 68 67  BILITOT 0.7 0.8 0.7  --   PROT 6.0* 6.3* 6.2* 4.6  ALBUMIN  --  2.5* 2.5* 2.2   Recent Labs    04/01/18 1432  08/29/18 0815  10/11/18 1114 10/12/18 0312 10/13/18 0257 10/17/18  WBC 8.9   < > 11.7*   < > 16.8* 24.0* 16.6* 13.3  NEUTROABS 6.3  --  8,471*  --   --   --   --   --   HGB 12.7*   < > 12.0*   < > 10.8* 9.8* 8.7* 9.3*  HCT 38.4*   < > 34.5*   < > 32.4* 29.5* 26.9* 28*  MCV 104.9*   < > 99.7   < > 100.6* 99.3 101.1*  --   PLT 317   < > 455*   < > 420* 414* 421* 477*   < > = values in this interval not displayed.   No results found for: TSH Lab Results  Component Value Date   HGBA1C 8.0 (H) 08/29/2018   Lab Results  Component Value Date   CHOL 126 08/29/2018   HDL 49 08/29/2018   LDLCALC 59 08/29/2018   TRIG 95 08/29/2018   CHOLHDL 2.6 08/29/2018    Significant Diagnostic Results in last 30 days:  No results found.  Assessment/Plan  /P AKA of Left leg Patient doing very well.  Working with therapy. His wound looks good and healing His pain is controlled on Tylenol.  He is planning to eventually go to assisted living after therapy Has  Appointment with Vascular in few days Right LE New Redness Little Worrisome due to history of Bad PVD Will treat him with Doxycyline for 7 days Await for Vascular Input  LE edema Now resolved Lasix was discontinued for now Anemia Hgb Low but stab;e Continue iron  Diabetes mellitus type 2 His last A1c was 8 in 03/20 but was probably high due to infection Continue on metformin CBG mostly Less then 200 Neuropathy Continue on low-dose of Neurontin Paroxysmal A. fib Tolerating Eliquis Does was reduced to 2.5 mg as per Pharmacy suggestion due to his Age and now weight On metoprolol  h/o  CVA Has recovered well Blood pressure is controlled On Eliquis and statin CKD stage III Creatinine stable  Hyperlipidemia On Zocor Hypertension  On losartan and metoprolol  Family/ staff Communication:  Labs/tests ordered:   Total time spent in this patient care encounter was  25_  minutes; greater than 50% of the visit spent counseling patient and staff, reviewing records , Labs and coordinating care for problems addressed at this encounter.

## 2018-11-11 ENCOUNTER — Telehealth (HOSPITAL_COMMUNITY): Payer: Self-pay | Admitting: Rehabilitation

## 2018-11-11 NOTE — Telephone Encounter (Signed)
The above patient or their representative was contacted and gave the following answers to these questions:         Do you have any of the following symptoms? No  Fever                    Cough                   Shortness of breath  Do  you have any of the following other symptoms? No   muscle pain         vomiting,        diarrhea        rash         weakness        red eye        abdominal pain         bruising          bruising or bleeding              joint pain           severe headache    Have you been in contact with someone who was or has been sick in the past 2 weeks? No  Yes                 Unsure                         Unable to assess   Does the person that you were in contact with have any of the following symptoms?   Cough         shortness of breath           muscle pain         vomiting,            diarrhea            rash            weakness           fever            red eye           abdominal pain           bruising  or  bleeding                joint pain                severe headache               Have you  or someone you have been in contact with traveled internationally in th last month?  No       If yes, which countries?   Have you  or someone you have been in contact with traveled outside [place] in th last month?  No       If yes, which state and city?   COMMENTS OR ACTION PLAN FOR THIS PATIENT:  Questionnaire answered by "Cecille Rubin" at [facility] that pt and any person with the pt must be masked.

## 2018-11-12 ENCOUNTER — Ambulatory Visit (INDEPENDENT_AMBULATORY_CARE_PROVIDER_SITE_OTHER): Payer: Self-pay | Admitting: Vascular Surgery

## 2018-11-12 ENCOUNTER — Other Ambulatory Visit: Payer: Self-pay

## 2018-11-12 ENCOUNTER — Encounter: Payer: Self-pay | Admitting: Vascular Surgery

## 2018-11-12 VITALS — BP 93/48 | HR 58 | Temp 96.8°F | Resp 16 | Ht 68.0 in | Wt 123.0 lb

## 2018-11-12 DIAGNOSIS — I739 Peripheral vascular disease, unspecified: Secondary | ICD-10-CM

## 2018-11-12 NOTE — Progress Notes (Signed)
Patient name: Nathan Cook MRN: [id number] DOB: [birth year] Sex: male  Cook FOR VISIT: Follow-up after left AKA  HPI: Nathan Cook is a 85 y.o. male with multiple medical problems including critical limb ischemia of the bilateral lower extremities with tissue loss that presents for postop follow-up.  He ultimately underwent left above-knee amputation on 10/11/2018 for non-reconstructable tibial disease.  He states his rest pain has completely resolved.  He is very happy with his progress.  His left above-knee amputation is healed no fevers drainage or other issues.  Has staples in place that were removed today.  He also has a right great toe wound with non-reconstructable disease of the right lower extremity and has previously undergone arteriogram.  Past Medical History:  Diagnosis Date  . BPH (benign prostatic hyperplasia) 10/14/2014  . Cervicalgia 01/04/2016  . Chronic kidney disease   . Contusion of left shoulder 05/27/14  . DM type 2 (diabetes mellitus, type 2) ([facility])   . Dysphagia   . High blood pressure   . History of basal cell cancer 11/21/2005   Right temple  . History of colonic polyps 10/14/2003  . Hyperlipidemia   . Left knee pain 05/27/14  . Peripheral vascular disease ([facility])   . SAH (subarachnoid hemorrhage) ([facility]) 2009   TBI  . Stroke ([facility])   . TIA (transient ischemic attack) 11/06/2005   Left eye pain. Slurred speech. Diaphoresis. No residual effects.     Past Surgical History:  Procedure Laterality Date  . ABDOMINAL AORTOGRAM W/LOWER EXTREMITY N/A 06/12/2018   Procedure: ABDOMINAL AORTOGRAM W/LOWER EXTREMITY;  Surgeon: Marty Heck, MD;  Location: [facility];  Service: Cardiovascular;  Laterality: N/A;  . Achilles tendon repair Left 1998   ruptured tendon  . AMPUTATION Left 10/11/2018   Procedure: Left  AMPUTATION ABOVE KNEE;  Surgeon: Marty Heck, MD;  Location: [facility];  Service: Vascular;  Laterality: Left;  . COLONOSCOPY  1992   Polyp removed  . COLONOSCOPY  1998   normal  . ORIF FEMUR FRACTURE Right 1929  . PERIPHERAL VASCULAR INTERVENTION Left 06/12/2018   Procedure: PERIPHERAL VASCULAR INTERVENTION;  Surgeon: Marty Heck, MD;  Location: [facility];  Service: Cardiovascular;  Laterality: Left;  Attempted   . TONSILLECTOMY      Family History  Problem Relation Age of Onset  . Heart attack Mother   . Heart disease Mother   . Heart disease Father     SOCIAL HISTORY: Social History   Tobacco Use  . Smoking status: Former Smoker    Last attempt to quit: 06/26/1971    Years since quitting: 47.4  . Smokeless tobacco: Never Used  . Tobacco comment: 2 1/2 packs a day, until 1972  Substance Use Topics  . Alcohol use: Yes    Alcohol/week: 1.0 - 2.0 standard drinks    Types: 1 - 2 Glasses of wine per week    Comment: Wine nightly    Allergies  Allergen Reactions  . Tape Other (See Comments)    TAPE WILL TEAR THE SKIN!!!!    Current Outpatient Medications  Medication Sig Dispense Refill  . acetaminophen (TYLENOL 8 HOUR) 650 MG CR tablet Take 1 tablet (650 mg total) by mouth every 8 (eight) hours as needed for pain. 60 tablet 0  . apixaban (ELIQUIS) 2.5 MG TABS tablet Take 2.5 mg by mouth 2 (two) times daily.    . Calcium Carb-Cholecalciferol (CALCIUM 600+D3 PO) Take 1 tablet by mouth daily.    Marland Kitchen  Cholecalciferol 1000 UNITS tablet Take 1,000 Units by mouth daily.    Marland Kitchen doxycycline (VIBRAMYCIN) 100 MG capsule Take 100 mg by mouth 2 (two) times daily.    . ferrous sulfate 325 (65 FE) MG tablet Take 325 mg by mouth daily with breakfast. Take 1 tablet on Monday, Wednesday and Friday.    . gabapentin (NEURONTIN) 100 MG capsule TAKE ONE CAPSULE BY MOUTH EVERY NIGHT AT BEDTIME 30 capsule 0  . losartan (COZAAR) 25 MG tablet Take 1 tablet (25 mg total) by mouth 2 (two) times daily. 180 tablet 1  . metFORMIN (GLUCOPHAGE) 500 MG tablet Take 1 tablet (500 mg total) by mouth 2 (two) times daily with a  meal. 180 tablet 1  . metoprolol tartrate (LOPRESSOR) 25 MG tablet Take 1 tablet (25 mg total) by mouth 2 (two) times daily. 180 tablet 1  . Nutritional Supplements (BOOST GLUCOSE CONTROL) LIQD Take 237 mLs by mouth 2 (two) times a day. Twice a day between meals    . Propylene Glycol-Glycerin 1-0.3 % SOLN Place 2 drops into both eyes as needed (dry eyes).     . simvastatin (ZOCOR) 10 MG tablet TAKE ONE TABLET BY MOUTH DAILY 90 tablet 0  . zinc oxide 20 % ointment Apply 1 application topically as needed for irritation. Apply to buttocks after every incontinent episode and as needed for redness    . oxyCODONE-acetaminophen (PERCOCET/ROXICET) 5-325 MG tablet Take 1 tablet by mouth every 4 (four) hours as needed for moderate pain. (Patient not taking: Reported on 11/12/2018) 30 tablet 0   No current facility-administered medications for this visit.     REVIEW OF SYSTEMS:  [X]  denotes positive finding, [ ]  denotes negative finding Cardiac  Comments:  Chest pain or chest pressure:    Shortness of breath upon exertion:    Short of breath when lying flat:    Irregular heart rhythm:        Vascular    Pain in calf, thigh, or hip brought on by ambulation:    Pain in feet at night that wakes you up from your sleep:     Blood clot in your veins:    Leg swelling:         Pulmonary    Oxygen at home:    Productive cough:     Wheezing:         Neurologic    Sudden weakness in arms or legs:     Sudden numbness in arms or legs:     Sudden onset of difficulty speaking or slurred speech:    Temporary loss of vision in one eye:     Problems with dizziness:         Gastrointestinal    Blood in stool:     Vomited blood:         Genitourinary    Burning when urinating:     Blood in urine:        Psychiatric    Major depression:         Hematologic    Bleeding problems:    Problems with blood clotting too easily:        Skin    Rashes or ulcers:        Constitutional    Fever or  chills:      PHYSICAL EXAM: Vitals:   11/12/18 1037  BP: (!) 93/48  Pulse: (!) 58  Resp: 16  Temp: (!) 96.8 F (36 C)  TempSrc: Oral  SpO2:  98%  Weight: 123 lb (55.8 kg)  Height: 5\' 8"  (1.727 m)    GENERAL: The patient is a well-nourished male, in no acute distress. The vital signs are documented above. VASCULAR: L AKA well healed, staples removed, no drainage Right great toe ulcer, dependent rubor right foot, no drainage or active infection  DATA:   None  Assessment/Plan:  85 year old male status post left above-knee amputation for non-reconstructable tibial disease in the setting of critical limb ischemia with rest pain and tissue loss.  Amputation is healed very nicely and staples were removed today in clinic.  He does have a wound on his right great toe and would require amputation on the right given unreconstructable tibial disease.  I offered him follow-up in 1 month to monitor this but he would prefer to call instead given that he is at [facility] in [place].  I would envision a right above-knee amputation if he has progression of rest pain or underlying infection that develops.  Plan for palliative wound care with Betadine paint for now.   Marty Heck, MD [facility] Office: [phone] Pager: [place]

## 2018-11-27 ENCOUNTER — Ambulatory Visit: Payer: Medicare Other | Admitting: Cardiology

## 2018-12-02 ENCOUNTER — Non-Acute Institutional Stay (SKILLED_NURSING_FACILITY): Payer: Medicare Other | Admitting: Family

## 2018-12-02 ENCOUNTER — Encounter: Payer: Self-pay | Admitting: Family

## 2018-12-02 DIAGNOSIS — I480 Paroxysmal atrial fibrillation: Secondary | ICD-10-CM

## 2018-12-02 DIAGNOSIS — I739 Peripheral vascular disease, unspecified: Secondary | ICD-10-CM

## 2018-12-02 DIAGNOSIS — E1122 Type 2 diabetes mellitus with diabetic chronic kidney disease: Secondary | ICD-10-CM

## 2018-12-02 DIAGNOSIS — E785 Hyperlipidemia, unspecified: Secondary | ICD-10-CM | POA: Diagnosis not present

## 2018-12-02 DIAGNOSIS — N183 Chronic kidney disease, stage 3 unspecified: Secondary | ICD-10-CM

## 2018-12-02 DIAGNOSIS — I129 Hypertensive chronic kidney disease with stage 1 through stage 4 chronic kidney disease, or unspecified chronic kidney disease: Secondary | ICD-10-CM

## 2018-12-02 NOTE — Progress Notes (Signed)
Location:  [facility] Room Number: [place] of Service:  SNF [phone]) Provider: Dinah Ngetich FNP-C   Virgie Dad, MD  Patient Care Team: Virgie Dad, MD as PCP - General (Internal Medicine) Buford Dresser, MD as PCP - Cardiology (Cardiology) Ricard Dillon, MD as Consulting Physician (Internal Medicine)  Extended Emergency Contact Information Primary Emergency Contact: Gordon,[place] Address: [address]          [place], [place] [address] [name] of [place] Phone: [phone] Relation: Daughter  Code Status: DNR Goals of care: Advanced Directive information Advanced Directives 12/02/2018  Does Patient Have a Medical Advance Directive? Yes  Type of Paramedic of [place];Living will  Does patient want to make changes to medical advance directive? No - Patient declined  Copy of [place] in Chart? Yes - validated most recent copy scanned in chart (See row information)  Pre-existing out of facility DNR order (yellow form or pink MOST form) -     Chief Complaint  Patient presents with  . Medical Management of Chronic Issues    Routine Visit    HPI:  Pt is a 85 y.o. 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 He continues to work with Physical Therapy on transfers.His weight has been stable for the past one month.He states his appetite is good.No fall episodes reported.CBG readings ranging in the 100's -  170's.  Past Medical History:  Diagnosis Date  . BPH (benign prostatic hyperplasia) 10/14/2014  . Cervicalgia 01/04/2016  . Chronic kidney disease   . Contusion of left shoulder 05/27/14  . DM type 2 (diabetes mellitus, type 2) ([place])   . Dysphagia   . High blood pressure   . History of basal cell cancer 11/21/2005   Right temple  . History of colonic polyps 10/14/2003  . Hyperlipidemia   . Left knee pain 05/27/14  . Peripheral vascular disease ([place])   . SAH (subarachnoid hemorrhage) ([facility]) 2009   TBI  . Stroke ([place])   . TIA (transient ischemic attack) 11/06/2005   Left eye pain. Slurred speech. Diaphoresis. No residual effects.    Past Surgical History:  Procedure Laterality Date  . ABDOMINAL AORTOGRAM W/LOWER EXTREMITY N/A 06/12/2018   Procedure: ABDOMINAL AORTOGRAM W/LOWER EXTREMITY;  Surgeon: Marty Heck, MD;  Location: [facility];  Service: Cardiovascular;  Laterality: N/A;  . Achilles tendon repair Left 1998   ruptured tendon  . AMPUTATION Left 10/11/2018   Procedure: Left  AMPUTATION ABOVE KNEE;  Surgeon: Marty Heck, MD;  Location: [facility];  Service: Vascular;  Laterality: Left;  . COLONOSCOPY  1992   Polyp removed  . COLONOSCOPY  1998   normal  . ORIF FEMUR FRACTURE Right 1929  . PERIPHERAL VASCULAR INTERVENTION Left 06/12/2018   Procedure: PERIPHERAL VASCULAR INTERVENTION;  Surgeon: Marty Heck, MD;  Location: [facility];  Service: Cardiovascular;  Laterality: Left;  Attempted   . TONSILLECTOMY      Allergies  Allergen Reactions  . Tape Other (See Comments)    TAPE WILL TEAR THE SKIN!!!!  Allergies as of 12/02/2018      Reactions   Tape Other (See Comments)   TAPE WILL TEAR THE SKIN!!!!      Medication List       Accurate as of December 02, 2018  9:01 PM. If you have any questions, ask your nurse or doctor.        acetaminophen 650 MG CR tablet Commonly known as:  Tylenol 8 Hour Take 1 tablet (650 mg total) by mouth  every 8 (eight) hours as needed for pain.   Boost Glucose Control Liqd Take 237 mLs by mouth 2 (two) times a day. Twice a day between meals   CALCIUM 600+D3 PO Take 1 tablet by mouth daily.   Cholecalciferol 25 MCG (1000 UT) tablet Take 1,000 Units by mouth daily.   Eliquis 2.5 MG Tabs tablet Generic drug:  apixaban Take 2.5 mg by mouth 2 (two) times daily.   ferrous sulfate 325 (65 FE) MG tablet Take 325 mg by mouth daily with breakfast. Take 1 tablet on Monday, Wednesday and Friday.   gabapentin 100 MG capsule Commonly known as:  NEURONTIN TAKE ONE CAPSULE BY MOUTH EVERY NIGHT AT BEDTIME   losartan 25 MG tablet Commonly known as:  COZAAR Take 1 tablet (25 mg total) by mouth 2 (two) times daily.   metFORMIN 500 MG tablet Commonly known as:  GLUCOPHAGE Take 1 tablet (500 mg total) by mouth 2 (two) times daily with a meal.   metoprolol tartrate 25 MG tablet Commonly known as:  LOPRESSOR Take 1 tablet (25 mg total) by mouth 2 (two) times daily.   oxyCODONE-acetaminophen 5-325 MG tablet Commonly known as:  PERCOCET/ROXICET Take 1 tablet by mouth every 4 (four) hours as needed for moderate pain.   Propylene Glycol-Glycerin 1-0.3 % Soln Place 2 drops into both eyes as needed (dry eyes).   simvastatin 10 MG tablet Commonly known as:  ZOCOR TAKE ONE TABLET BY MOUTH DAILY   zinc oxide 20 % ointment Apply 1 application topically as needed for irritation. Apply to buttocks after every incontinent episode and as needed for redness       Review of Systems  Constitutional: Negative for activity change, appetite change, chills, fatigue, fever and unexpected weight change.  HENT: Negative for congestion, rhinorrhea, sinus pressure, sinus pain, sneezing and sore throat.   Eyes: Negative for discharge, redness and itching.  Respiratory: Negative for cough, chest tightness, shortness of breath and wheezing.   Cardiovascular: Negative for chest pain, palpitations and leg  swelling.  Gastrointestinal: Negative for abdominal distention, abdominal pain, constipation, diarrhea, nausea and vomiting.  Endocrine: Negative for cold intolerance, heat intolerance, polydipsia, polyphagia and polyuria.  Genitourinary: Negative for difficulty urinating, dysuria, flank pain, frequency and urgency.  Musculoskeletal: Positive for gait problem.       Left AKA.No fall episode   Skin: Negative for color change, pallor and rash.       Right great toe wound.  Neurological: Negative for dizziness, light-headedness and headaches.  Hematological: Does not bruise/bleed easily.  Psychiatric/Behavioral: Negative for agitation, behavioral problems and sleep disturbance. The patient is not nervous/anxious.     Immunization History  Administered Date(s) Administered  . DTaP 12/31/2012  . Influenza, High Dose Seasonal PF 04/04/2017  . Influenza,inj,Quad PF,6+ Mos 03/28/2018  . Influenza-Unspecified 04/10/2014, 03/25/2015, 04/06/2016  . PPD Test 04/29/2014  . Pneumococcal Conjugate-13 04/28/2017  . Pneumococcal-Unspecified 03/13/2011  . Tdap 04/28/2017  . Zoster Recombinat (Shingrix) 12/14/2017   Pertinent  Health Maintenance Due  Topic Date Due  . OPHTHALMOLOGY EXAM  07/07/2017  . FOOT EXAM  06/27/2018  . INFLUENZA VACCINE  01/25/2019  . HEMOGLOBIN A1C  03/01/2019  . PNA vac Low Risk Adult  Completed   Fall Risk  10/02/2018 09/11/2018 08/28/2018 08/08/2018 08/07/2018  Falls in the past year? 0 0 0 0 0  Number falls in past yr: 0 0 0 - 0  Injury with Fall? 0 0 0 - 0  Risk for fall due to : - - - - -   Functional Status Survey:    Vitals:   12/02/18 1442  BP: (!) 96/52  Pulse: 64  Resp: 18  Temp: 97.6 F (36.4 C)  TempSrc: Oral  SpO2: 96%  Weight: 125 lb 1.6 oz (56.7 kg)  Height: 5\' 8"  (1.727 m)   Body mass index is 19.02 kg/m. Physical Exam Vitals signs and nursing note reviewed.  Constitutional:      General: He is not in acute distress.    Appearance: He is  normal weight. He is not ill-appearing.  HENT:     Head: Normocephalic.     Nose: No congestion or rhinorrhea.     Mouth/Throat:     Mouth: Mucous membranes are moist.     Pharynx: Oropharynx is clear. No oropharyngeal exudate or posterior oropharyngeal erythema.  Eyes:     General: No scleral icterus.       Right eye: No discharge.        Left eye: No discharge.     Conjunctiva/sclera: Conjunctivae normal.     Pupils: Pupils are equal, round, and reactive to light.  Neck:     Musculoskeletal: Normal range of motion. No neck rigidity or muscular tenderness.     Vascular: No carotid bruit.  Cardiovascular:     Rate and Rhythm: Normal rate and regular rhythm.     Heart sounds: Murmur present. No friction rub. No gallop.      Comments: Right pedal pulse non-palpable. Pulmonary:     Effort: Pulmonary effort is normal. No respiratory distress.     Breath sounds: Normal breath sounds. No wheezing, rhonchi or rales.  Chest:     Chest wall: No tenderness.  Abdominal:     General: Bowel sounds are normal. There is no distension.     Palpations: Abdomen is soft. There is no mass.     Tenderness: There is no abdominal tenderness. There is no right CVA tenderness, left CVA tenderness, guarding or rebound.     Hernia: No hernia is present.  Musculoskeletal:        General: No swelling or tenderness.     Comments: Left AKA incision completely healed no signs of infections.self propels on wheelchair.RLE trace edema.  Lymphadenopathy:     Cervical: No cervical adenopathy.  Skin:    General: Skin is warm and dry.     Coloration: Skin is not pale.     Findings: No bruising, erythema or rash.     Comments: Right great toe ulcer wound bed dry skin surrounding skin without any signs of infections.   Neurological:     Mental Status: He is alert and oriented to person, place, and time.     Cranial Nerves: No cranial nerve deficit.     Gait: Gait abnormal.  Psychiatric:        Mood and Affect:  Mood normal.        Behavior: Behavior normal.        Thought Content: Thought content  normal.        Judgment: Judgment normal.     Comments: Dysarthria present     Labs reviewed: Recent Labs    10/11/18 0306  10/12/18 0312 10/13/18 0257 10/17/18 10/22/18  NA 134*  --  132* 134* 136* 132*  K 4.6  --  4.9 4.3 4.7 4.5  CL 101  --  100 101 103  --   CO2 26  --  23 25 29   --   GLUCOSE 194*  --  243* 120*  --   --   BUN 26*  --  31* 26* 25* 20  CREATININE 1.45*   < > 1.41* 1.30* 1.1 1.0  CALCIUM 7.9*  --  8.0* 7.7* 8.0  --    < > = values in this interval not displayed.   Recent Labs    08/29/18 0815 10/09/18 1352 10/09/18 1920 10/17/18 10/22/18  AST 15 23 22 22 16   ALT 13 22 21 14 11   ALKPHOS  --  64 68 67 69  BILITOT 0.7 0.8 0.7  --   --   PROT 6.0* 6.3* 6.2* 4.6  --   ALBUMIN  --  2.5* 2.5* 2.2  --    Recent Labs    04/01/18 1432  08/29/18 0815  10/11/18 1114 10/12/18 0312 10/13/18 0257 10/17/18 10/22/18  WBC 8.9   < > 11.7*   < > 16.8* 24.0* 16.6* 13.3 9.5  NEUTROABS 6.3  --  8,471*  --   --   --   --   --   --   HGB 12.7*   < > 12.0*   < > 10.8* 9.8* 8.7* 9.3* 11.4*  HCT 38.4*   < > 34.5*   < > 32.4* 29.5* 26.9* 28* 33*  MCV 104.9*   < > 99.7   < > 100.6* 99.3 101.1*  --   --   PLT 317   < > 455*   < > 420* 414* 421* 477* 483*   < > = values in this interval not displayed.   Lab Results  Component Value Date   TSH 1.69 10/24/2018   Lab Results  Component Value Date   HGBA1C 8.0 (H) 08/29/2018   Lab Results  Component Value Date   CHOL 126 08/29/2018   HDL 49 08/29/2018   LDLCALC 59 08/29/2018   TRIG 95 08/29/2018   CHOLHDL 2.6 08/29/2018    Significant Diagnostic Results in last 30 days:  No results found.  Assessment/Plan 1. Controlled type 2 diabetes mellitus with stage 3 chronic kidney disease, without long-term current use of insulin (HCC) Lab Results  Component Value Date   HGBA1C 8.0 (H) 08/29/2018   CBG reviewed stable.continue on  metformin 500 mg tablet twice daily.Gabapentin for neuropathy.on ARB,statin and apixaban.  2. Paroxysmal atrial fibrillation (HCC) Continue on apixaban 2.5 mg tablet twice daily and metoprolol 25 mg tablet twice daily.  3. PAD (peripheral artery disease) (HCC) S/p left AKA .Has ongoing right great toe ulcer.continue to follow up with vascular and vein specialist.continue on apixaban and simvastatin.  4. Hyperlipidemia LDL goal <70 LDL at goal.Has hx of TIA.continue on simvastatin 10 mg tablet daily.  5. Benign hypertension with chronic kidney disease, stage III ([facility]) B/p reviewed.continue on metoprolol and losartan.Recent CR 1.0 at baseline.   Family/ staff Communication: Reviewed plan of care with patient and facility Nurse supervisor   Labs/tests ordered: None   Sandrea Hughs, NP

## 2018-12-05 ENCOUNTER — Other Ambulatory Visit: Payer: Medicare Other

## 2018-12-05 ENCOUNTER — Other Ambulatory Visit: Payer: Self-pay

## 2018-12-05 DIAGNOSIS — S91109S Unspecified open wound of unspecified toe(s) without damage to nail, sequela: Secondary | ICD-10-CM

## 2018-12-11 ENCOUNTER — Encounter: Payer: Self-pay | Admitting: Internal Medicine

## 2018-12-13 ENCOUNTER — Non-Acute Institutional Stay (SKILLED_NURSING_FACILITY): Payer: Medicare Other | Admitting: Internal Medicine

## 2018-12-13 ENCOUNTER — Encounter: Payer: Self-pay | Admitting: Internal Medicine

## 2018-12-13 DIAGNOSIS — N183 Chronic kidney disease, stage 3 (moderate): Secondary | ICD-10-CM

## 2018-12-13 DIAGNOSIS — I952 Hypotension due to drugs: Secondary | ICD-10-CM

## 2018-12-13 DIAGNOSIS — I739 Peripheral vascular disease, unspecified: Secondary | ICD-10-CM

## 2018-12-13 DIAGNOSIS — I480 Paroxysmal atrial fibrillation: Secondary | ICD-10-CM

## 2018-12-13 DIAGNOSIS — E1122 Type 2 diabetes mellitus with diabetic chronic kidney disease: Secondary | ICD-10-CM

## 2018-12-13 NOTE — Progress Notes (Signed)
Location:  [facility] Room Number: 35/A Place of Service:  SNF (31) Provider:  L.MD   Virgie Dad, MD  Patient Care Team: Virgie Dad, MD as PCP - General (Internal Medicine) Buford Dresser, MD as PCP - Cardiology (Cardiology) Ricard Dillon, MD as Consulting Physician (Internal Medicine)  Extended Emergency Contact Information Primary Emergency Contact: Gordon,[place] Address: [address]          [place], [place] [address] [name] of [place] Phone: [phone] Relation: Daughter  Code Status: DNR Goals of care: Advanced Directive information Advanced Directives 12/13/2018  Does Patient Have a Medical Advance Directive? Yes  Type of Advance Directive Living will;Healthcare Power of Attorney  Does patient want to make changes to medical advance directive? No - Patient declined  Copy of [place] in Chart? Yes - validated most recent copy scanned in chart (See row information)  Pre-existing out of facility DNR order (yellow form or pink MOST form) -     Chief Complaint  Patient presents with  . Acute Visit    low blood pressure     HPI:  Pt is a 85 y.o. male seen today for Acute issues per Nurses his BP has been running low  Patient has h/o Hypertension, Hyperlipidemia, Diabetes mellitus, h/o Brain stem Infarct s/p TPA In 10/19,  Atrial Fibrillation on Eliquis since 10/19.,PAD   He was admitted electively to undergo Left AKA as he was having unremitting pain in his Left Feet with Chronic Wound.Patient finally agreed to undergo the left AKA which he went on 04/17. He had uncomplicated postop course He has done well in facility Is able to transfer himself. Is mostly Independent in his ADLS Is planning to be going to [facility] Patient has had few BP with SBP less then 100 He denies feeling Dizzy or weak His appetite is good.no Fever or Chills    Past Medical History:   Diagnosis Date  . BPH (benign prostatic hyperplasia) 10/14/2014  . Cervicalgia 01/04/2016  . Chronic kidney disease   . Contusion of left shoulder 05/27/14  . DM type 2 (diabetes mellitus, type 2) ([facility])   . Dysphagia   . High blood pressure   . History of basal cell cancer 11/21/2005   Right temple  . History of colonic polyps 10/14/2003  . Hyperlipidemia   . Left knee pain 05/27/14  . Peripheral vascular disease ([facility])   . SAH (subarachnoid hemorrhage) ([facility]) 2009   TBI  . Stroke ([facility])   . TIA (transient ischemic attack) 11/06/2005   Left eye pain. Slurred speech. Diaphoresis. No residual effects.    Past Surgical History:  Procedure Laterality Date  . ABDOMINAL AORTOGRAM W/LOWER EXTREMITY N/A 06/12/2018   Procedure: ABDOMINAL AORTOGRAM W/LOWER EXTREMITY;  Surgeon: Marty Heck, MD;  Location: [facility];  Service: Cardiovascular;  Laterality: N/A;  . Achilles tendon repair Left 1998   ruptured tendon  . AMPUTATION Left 10/11/2018   Procedure: Left  AMPUTATION ABOVE KNEE;  Surgeon: Marty Heck, MD;  Location: [facility];  Service: Vascular;  Laterality: Left;  . COLONOSCOPY  1992   Polyp removed  . COLONOSCOPY  1998   normal  . ORIF FEMUR FRACTURE Right 1929  . PERIPHERAL VASCULAR INTERVENTION Left 06/12/2018   Procedure: PERIPHERAL VASCULAR INTERVENTION;  Surgeon: Marty Heck, MD;  Location: [facility];  Service: Cardiovascular;  Laterality: Left;  Attempted   . TONSILLECTOMY  Allergies  Allergen Reactions  . Tape Other (See Comments)    TAPE WILL TEAR THE SKIN!!!!    Outpatient Encounter Medications as of 12/13/2018  Medication Sig  . acetaminophen (TYLENOL 8 HOUR) 650 MG CR tablet Take 1 tablet (650 mg total) by mouth every 8 (eight) hours as needed for pain.  [place] Kitchen apixaban (ELIQUIS) 2.5 MG TABS tablet Take 2.5 mg by mouth 2 (two) times daily.  . Calcium Carb-Cholecalciferol (CALCIUM 600+D3 PO) Take 1 tablet by mouth daily.  .  Cholecalciferol 1000 UNITS tablet Take 1,000 Units by mouth daily.  . ferrous sulfate 325 (65 FE) MG tablet Take 325 mg by mouth daily with breakfast. Take 1 tablet on Monday, Wednesday and Friday.  . gabapentin (NEURONTIN) 100 MG capsule TAKE ONE CAPSULE BY MOUTH EVERY NIGHT AT BEDTIME  . losartan (COZAAR) 25 MG tablet Take 1 tablet (25 mg total) by mouth 2 (two) times daily.  . metFORMIN (GLUCOPHAGE) 500 MG tablet Take 1 tablet (500 mg total) by mouth 2 (two) times daily with a meal.  . metoprolol tartrate (LOPRESSOR) 25 MG tablet Take 1 tablet (25 mg total) by mouth 2 (two) times daily.  . Nutritional Supplements (BOOST GLUCOSE CONTROL) LIQD Take 237 mLs by mouth 2 (two) times a day. Twice a day between meals  . oxyCODONE-acetaminophen (PERCOCET/ROXICET) 5-325 MG tablet Take 1 tablet by mouth every 4 (four) hours as needed for moderate pain.  [place] Kitchen Propylene Glycol-Glycerin 1-0.3 % SOLN Place 2 drops into both eyes as needed (dry eyes).   . simvastatin (ZOCOR) 10 MG tablet TAKE ONE TABLET BY MOUTH DAILY  . zinc oxide 20 % ointment Apply 1 application topically as needed for irritation. Apply to buttocks after every incontinent episode and as needed for redness   No facility-administered encounter medications on file as of 12/13/2018.     Review of Systems  Constitutional: Negative.   HENT: Negative.   Respiratory: Negative.   Cardiovascular: Negative.   Gastrointestinal: Positive for diarrhea.  Genitourinary: Negative.   Musculoskeletal: Negative.   Skin: Negative.   Neurological: Negative.   Psychiatric/Behavioral: Negative.   All other systems reviewed and are negative.       Immunization History  Administered Date(s) Administered  . DTaP 12/31/2012  . Influenza, High Dose Seasonal PF 04/04/2017  . Influenza,inj,Quad PF,6+ Mos 03/28/2018  . Influenza-Unspecified 04/10/2014, 03/25/2015, 04/06/2016  . PPD Test 04/29/2014  . Pneumococcal Conjugate-13 04/28/2017  .  Pneumococcal-Unspecified 03/13/2011  . Tdap 04/28/2017  . Zoster Recombinat (Shingrix) 12/14/2017   Pertinent  Health Maintenance Due  Topic Date Due  . OPHTHALMOLOGY EXAM  07/07/2017  . FOOT EXAM  06/27/2018  . INFLUENZA VACCINE  01/25/2019  . HEMOGLOBIN A1C  03/01/2019  . PNA vac Low Risk Adult  Completed   Fall Risk  10/02/2018 09/11/2018 08/28/2018 08/08/2018 08/07/2018  Falls in the past year? 0 0 0 0 0  Number falls in past yr: 0 0 0 - 0  Injury with Fall? 0 0 0 - 0  Risk for fall due to : - - - - -   Functional Status Survey:    Vitals:   12/13/18 1622  BP: (!) 105/56  Pulse: 64  Resp: 18  Temp: (!) 97.2 F (36.2 C)  SpO2: 95%  Weight: 128 lb 12.8 oz (58.4 kg)  Height: 5\' 8"  (1.727 m)   Body mass index is 19.58 kg/m. Physical Exam Vitals signs reviewed.  Constitutional:      Appearance: Normal appearance.  HENT:  Head: Normocephalic.     Nose: Nose normal.     Mouth/Throat:     Mouth: Mucous membranes are moist.     Pharynx: Oropharynx is clear.  Eyes:     Pupils: Pupils are equal, round, and reactive to light.  Neck:     Musculoskeletal: Neck supple.  Cardiovascular:     Rate and Rhythm: Normal rate.     Heart sounds: Murmur present.  Pulmonary:     Effort: Pulmonary effort is normal.     Breath sounds: Normal breath sounds.  Abdominal:     General: Abdomen is flat. Bowel sounds are normal.     Palpations: Abdomen is soft.  Musculoskeletal:        General: No swelling.     Comments: Left AKA Right toe has small Arterial Ulcer Rest of the toes are looking better now  Skin:    General: Skin is warm and dry.  Neurological:     General: No focal deficit present.     Mental Status: He is alert and oriented to person, place, and time.  Psychiatric:        Mood and Affect: Mood normal.        Thought Content: Thought content normal.     Labs reviewed: Recent Labs    10/11/18 0306  10/12/18 0312 10/13/18 0257 10/17/18 10/22/18  NA 134*  --   132* 134* 136* 132*  K 4.6  --  4.9 4.3 4.7 4.5  CL 101  --  100 101 103  --   CO2 26  --  23 25 29   --   GLUCOSE 194*  --  243* 120*  --   --   BUN 26*  --  31* 26* 25* 20  CREATININE 1.45*   < > 1.41* 1.30* 1.1 1.0  CALCIUM 7.9*  --  8.0* 7.7* 8.0  --    < > = values in this interval not displayed.   Recent Labs    08/29/18 0815 10/09/18 1352 10/09/18 1920 10/17/18 10/22/18  AST 15 23 22 22 16   ALT 13 22 21 14 11   ALKPHOS  --  64 68 67 69  BILITOT 0.7 0.8 0.7  --   --   PROT 6.0* 6.3* 6.2* 4.6  --   ALBUMIN  --  2.5* 2.5* 2.2  --    Recent Labs    04/01/18 1432  08/29/18 0815  10/11/18 1114 10/12/18 0312 10/13/18 0257 10/17/18 10/22/18  WBC 8.9   < > 11.7*   < > 16.8* 24.0* 16.6* 13.3 9.5  NEUTROABS 6.3  --  8,471*  --   --   --   --   --   --   HGB 12.7*   < > 12.0*   < > 10.8* 9.8* 8.7* 9.3* 11.4*  HCT 38.4*   < > 34.5*   < > 32.4* 29.5* 26.9* 28* 33*  MCV 104.9*   < > 99.7   < > 100.6* 99.3 101.1*  --   --   PLT 317   < > 455*   < > 420* 414* 421* 477* 483*   < > = values in this interval not displayed.   Lab Results  Component Value Date   TSH 1.69 10/24/2018   Lab Results  Component Value Date   HGBA1C 8.0 (H) 08/29/2018   Lab Results  Component Value Date   CHOL 126 08/29/2018   HDL 49 08/29/2018   LDLCALC 59  08/29/2018   TRIG 95 08/29/2018   CHOLHDL 2.6 08/29/2018    Significant Diagnostic Results in last 30 days:  No results found.  Assessment/Plan Hypotension BP running Low Some readings are less then 100 Will Reduce his Losartan to 25 mg QD S/P AKA of Left leg Patient doing very well.   Able to transfer himself and is independent in his ADLS Pain Controlled On Low dose of Neurontin Soon will go to Assisted living Discontinue Oxycodone  Right LE New Redness Seems Stable Per Vascular Continue Palliative measures as he has Unreconstructable Disease  Anemia Hgb  Low but stable Continue iron Repeat CBC  Diabetes mellitus type 2  His last A1c was 8 in 03/20 but was probably high due to infection Continue on metformin CBG mostly Less then 200 Repeat HBA1c Neuropathy Continue on low-dose of Neurontin Paroxysmal A. fib Tolerating Eliquis Does was reduced to 2.5 mg as per Pharmacy suggestion due to his Age and now weight On metoprolol  h/o  CVA Has recovered well Blood pressure is controlled On Eliquis and statin CKD stage III Creatinine stable  Hyperlipidemia On Zocor LDL 59 in 03/20  Family/ staff Communication:  Labs/tests ordered: CBC,HBA1C, BMP  Total time spent in this patient care encounter was  25_  minutes; greater than 50% of the visit spent counseling patient and staff, reviewing records , Labs and coordinating care for problems addressed at this encounter.

## 2018-12-14 DIAGNOSIS — I959 Hypotension, unspecified: Secondary | ICD-10-CM | POA: Insufficient documentation

## 2018-12-17 LAB — CBC AND DIFFERENTIAL
HCT: 33 — AB (ref 41–53)
Hemoglobin: 11.4 — AB (ref 13.5–17.5)

## 2018-12-17 LAB — BASIC METABOLIC PANEL
BUN: 38 — AB (ref 4–21)
Creatinine: 1.3 (ref 0.6–1.3)
Glucose: 115
Sodium: 132 — AB (ref 137–147)

## 2018-12-17 LAB — HEMOGLOBIN A1C: Hemoglobin A1C: 6.8

## 2018-12-21 LAB — NOVEL CORONAVIRUS, NAA: SARS-CoV-2, NAA: NOT DETECTED

## 2018-12-26 ENCOUNTER — Non-Acute Institutional Stay (SKILLED_NURSING_FACILITY): Payer: Medicare Other | Admitting: Internal Medicine

## 2018-12-26 ENCOUNTER — Encounter: Payer: Self-pay | Admitting: Internal Medicine

## 2018-12-26 DIAGNOSIS — T148XXA Other injury of unspecified body region, initial encounter: Secondary | ICD-10-CM | POA: Diagnosis not present

## 2018-12-26 DIAGNOSIS — L089 Local infection of the skin and subcutaneous tissue, unspecified: Secondary | ICD-10-CM

## 2018-12-26 DIAGNOSIS — I739 Peripheral vascular disease, unspecified: Secondary | ICD-10-CM

## 2018-12-26 DIAGNOSIS — M792 Neuralgia and neuritis, unspecified: Secondary | ICD-10-CM

## 2018-12-26 NOTE — Progress Notes (Signed)
Location:  [facility] Room Number: 35/A Place of Service:  SNF (31) Provider:Terrall Bley L,MD   Virgie Dad, MD  Patient Care Team: Virgie Dad, MD as PCP - General (Internal Medicine) Buford Dresser, MD as PCP - Cardiology (Cardiology) Ricard Dillon, MD as Consulting Physician (Internal Medicine)  Extended Emergency Contact Information Primary Emergency Contact: Gordon,[place] Address: [address]          [place], [place] [address] [name] of [place] Phone: [phone] Relation: Daughter  Code Status: DNR Goals of care: Advanced Directive information Advanced Directives 12/26/2018  Does Patient Have a Medical Advance Directive? Yes  Type of Advance Directive Living will;Healthcare Power of Attorney  Does patient want to make changes to medical advance directive? No - Patient declined  Copy of [place] in Chart? Yes - validated most recent copy scanned in chart (See row information)  Pre-existing out of facility DNR order (yellow form or pink MOST form) -     Chief Complaint  Patient presents with  . Acute Visit    Wound follow up   . Health Maintenance    foot exam, ophthalmology exam     HPI:  Pt is a 85 y.o. male seen today for an acute visit for Wound on his Right Great toe with Redness in all his other toes.  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 He has seen Vascular Surgery and it is Unreconstructable. They think he will need AKA of that leg also. Patient right now has refused to follow with them But for past few days there has been more redness around the Wound . Other toes are red also with Redness going on the Foot. With some discharge  And he says he has been feeling Mild Pain in that area also No  Fever or Chills   Past Medical History:  Diagnosis Date  . BPH (benign prostatic hyperplasia) 10/14/2014  . Cervicalgia 01/04/2016  . Chronic kidney disease   . Contusion of left shoulder 05/27/14  . DM type 2 (diabetes mellitus, type 2) ([place])   . Dysphagia   . High blood pressure   . History of basal cell cancer 11/21/2005   Right temple  . History of colonic polyps 10/14/2003  . Hyperlipidemia   . Left knee pain 05/27/14  . Peripheral vascular disease ([place])   . SAH (subarachnoid hemorrhage) ([place] [facility]) 2009   TBI  . Stroke ([facility])   . TIA (transient ischemic attack) 11/06/2005   Left eye pain. Slurred speech. Diaphoresis. No residual effects.    Past Surgical History:  Procedure Laterality Date  . ABDOMINAL AORTOGRAM W/LOWER EXTREMITY N/A 06/12/2018   Procedure: ABDOMINAL AORTOGRAM W/LOWER EXTREMITY;  Surgeon: Marty Heck, MD;  Location: [facility];  Service: Cardiovascular;  Laterality: N/A;  . Achilles tendon repair Left 1998   ruptured tendon  . AMPUTATION Left 10/11/2018   Procedure: Left  AMPUTATION ABOVE KNEE;  Surgeon: Marty Heck, MD;  Location: [facility];  Service: Vascular;  Laterality: Left;  . COLONOSCOPY  1992   Polyp removed  . COLONOSCOPY  1998   normal  . ORIF FEMUR FRACTURE Right 1929  . PERIPHERAL VASCULAR INTERVENTION Left 06/12/2018   Procedure: PERIPHERAL VASCULAR INTERVENTION;  Surgeon: Marty Heck, MD;  Location: [facility];  Service: Cardiovascular;  Laterality: Left;  Attempted   . TONSILLECTOMY      Allergies  Allergen Reactions  . Tape Other (See Comments)    TAPE WILL TEAR THE SKIN!!!!    Outpatient Encounter Medications as of 12/26/2018  Medication Sig  . acetaminophen (TYLENOL 8 HOUR) 650 MG CR tablet Take 1 tablet (650 mg total) by mouth every 8 (eight) hours as needed for pain.  Marland Kitchen apixaban (ELIQUIS) 2.5 MG TABS tablet Take 2.5 mg by mouth 2 (two) times daily.  . Calcium Carb-Cholecalciferol (CALCIUM 600+D3 PO)  Take 1 tablet by mouth daily.  . Cholecalciferol 1000 UNITS tablet Take 1,000 Units by mouth daily.  . ferrous sulfate 325 (65 FE) MG tablet Take 325 mg by mouth daily with breakfast. Take 1 tablet on Monday, Wednesday and Friday.  . gabapentin (NEURONTIN) 100 MG capsule TAKE ONE CAPSULE BY MOUTH EVERY NIGHT AT BEDTIME  . losartan (COZAAR) 25 MG tablet Take 25 mg by mouth daily.  . metFORMIN (GLUCOPHAGE) 500 MG tablet Take 1 tablet (500 mg total) by mouth 2 (two) times daily with a meal.  . metoprolol tartrate (LOPRESSOR) 25 MG tablet Take 1 tablet (25 mg total) by mouth 2 (two) times daily.  . Nutritional Supplements (BOOST GLUCOSE CONTROL) LIQD Take 237 mLs by mouth 2 (two) times a day. Twice a day between meals  . Propylene Glycol-Glycerin 1-0.3 % SOLN Place 2 drops into both eyes as needed (dry eyes).   . simvastatin (ZOCOR) 10 MG tablet TAKE ONE TABLET BY MOUTH DAILY  . zinc oxide 20 % ointment Apply 1 application topically as needed for irritation. Apply to buttocks after every incontinent episode and as needed for redness  . [DISCONTINUED] losartan (COZAAR) 25 MG tablet Take 1 tablet (25 mg total) by mouth 2 (two) times daily. (Patient taking differently: Take 25 mg by mouth daily. )  . [DISCONTINUED] oxyCODONE-acetaminophen (PERCOCET/ROXICET) 5-325 MG tablet Take 1 tablet by mouth every 4 (four) hours as needed for moderate pain.   No facility-administered encounter medications on file as of 12/26/2018.     Review of Systems  Review of Systems  Constitutional: Negative for activity change, appetite change, chills, diaphoresis, fatigue and fever.  HENT: Negative for mouth sores, postnasal drip, rhinorrhea, sinus pain and sore throat.   Respiratory: Negative for apnea, cough, chest tightness, shortness of breath and wheezing.   Cardiovascular: Negative for chest pain, palpitations and leg swelling.  Gastrointestinal: Negative for abdominal distention, abdominal pain, constipation,  diarrhea, nausea and vomiting.  Genitourinary: Negative for dysuria and frequency.  Musculoskeletal: Negative for arthralgias, joint swelling and myalgias.  Skin: Negative for rash.  Neurological: Negative for dizziness, syncope, weakness, light-headedness and numbness.  Psychiatric/Behavioral: Negative for behavioral problems, confusion and sleep disturbance.     Immunization History  Administered Date(s) Administered  . DTaP 12/31/2012  . Influenza, High Dose Seasonal PF 04/04/2017  . Influenza,inj,Quad PF,6+ Mos 03/28/2018  . Influenza-Unspecified 04/10/2014, 03/25/2015, 04/06/2016  . PPD Test 04/29/2014  . Pneumococcal Conjugate-13 04/28/2017  . Pneumococcal-Unspecified 03/13/2011  . Td 12/24/2012  . Tdap 04/28/2017  . Zoster Recombinat (Shingrix) 12/14/2017   Pertinent  Health Maintenance Due  Topic Date Due  . OPHTHALMOLOGY EXAM  07/07/2017  . FOOT EXAM  06/27/2018  . INFLUENZA VACCINE  01/25/2019  . HEMOGLOBIN A1C  03/01/2019  . PNA vac Low Risk Adult  Completed   Fall Risk  10/02/2018 09/11/2018 08/28/2018 08/08/2018 08/07/2018  Falls in the past year? 0 0  0 0 0  Number falls in past yr: 0 0 0 - 0  Injury with Fall? 0 0 0 - 0  Risk for fall due to : - - - - -   Functional Status Survey:    Vitals:   12/26/18 0954  BP: (!) 108/59  Pulse: 75  Resp: 20  Temp: 97.9 F (36.6 C)  SpO2: 96%  Weight: 125 lb 9.6 oz (57 kg)  Height: 5\' 8"  (1.727 m)   Body mass index is 19.1 kg/m. Physical Exam  Constitutional: Oriented to person, place, and time. Well-developed and well-nourished.  HENT:  Head: Normocephalic.  Mouth/Throat: Oropharynx is clear and moist.  Eyes: Pupils are equal, round, and reactive to light.  Neck: Neck supple.  Cardiovascular: Normal rate and normal heart sounds.  Positive for Murmur Pulmonary/Chest: Effort normal and breath sounds normal. No respiratory distress. No wheezes. She has no rales.  Abdominal: Soft. Bowel sounds are normal. No  distension. There is no tenderness. There is no rebound.  Musculoskeletal: S/P Left AKA has wound on the Right great toe with Necrosis on the base and redness around the wound and on his other toes No Pulse Lymphadenopathy: none Neurological: Alert and oriented to person, place, and time.  Skin: Skin is warm and dry.  Psychiatric: Normal mood and affect. Behavior is normal. Thought content normal.    Labs reviewed: Recent Labs    10/11/18 0306  10/12/18 0312 10/13/18 0257 10/17/18 10/22/18 12/17/18  NA 134*  --  132* 134* 136* 132* 132*  K 4.6  --  4.9 4.3 4.7 4.5  --   CL 101  --  100 101 103  --   --   CO2 26  --  23 25 29   --   --   GLUCOSE 194*  --  243* 120*  --   --   --   BUN 26*  --  31* 26* 25* 20 38*  CREATININE 1.45*   < > 1.41* 1.30* 1.1 1.0 1.3  CALCIUM 7.9*  --  8.0* 7.7* 8.0  --   --    < > = values in this interval not displayed.   Recent Labs    08/29/18 0815 10/09/18 1352 10/09/18 1920 10/17/18 10/22/18  AST 15 23 22 22 16   ALT 13 22 21 14 11   ALKPHOS  --  64 68 67 69  BILITOT 0.7 0.8 0.7  --   --   PROT 6.0* 6.3* 6.2* 4.6  --   ALBUMIN  --  2.5* 2.5* 2.2  --    Recent Labs    04/01/18 1432  08/29/18 0815  10/11/18 1114 10/12/18 0312 10/13/18 0257 10/17/18 10/22/18 12/17/18  WBC 8.9   < > 11.7*   < > 16.8* 24.0* 16.6* 13.3 9.5  --   NEUTROABS 6.3  --  8,471*  --   --   --   --   --   --   --   HGB 12.7*   < > 12.0*   < > 10.8* 9.8* 8.7* 9.3* 11.4* 11.4*  HCT 38.4*   < > 34.5*   < > 32.4* 29.5* 26.9* 28* 33* 33*  MCV 104.9*   < > 99.7   < > 100.6* 99.3 101.1*  --   --   --   PLT 317   < > 455*   < > 420* 414* 421* 477* 483*  --    < > = values in this  interval not displayed.   Lab Results  Component Value Date   TSH 1.69 10/24/2018   Lab Results  Component Value Date   HGBA1C 6.8 12/17/2018   Lab Results  Component Value Date   CHOL 126 08/29/2018   HDL 49 08/29/2018   LDLCALC 59 08/29/2018   TRIG 95 08/29/2018   CHOLHDL 2.6  08/29/2018    Significant Diagnostic Results in last 30 days:  No results found.  Assessment/Plan Infected Arterial Ulcer Will start on Doxycyline again for 7 days Santyl for the wound base Discontinue topical Antibiotic  Per Vascular Continue Palliative measures as he has Unreconstructable Disease Patient does not want to see them right now   Other issues  Anemia Hgb   stable Continue iron Diabetes mellitus type 2 His Repeat A1C is 6.8 Continue on metformin CBG mostly Less then 200  Neuropathy Continue on low-dose of Neurontin Paroxysmal A. fib Tolerating Eliquis Does was reduced to 2.5 mg as per Pharmacy suggestion due to his Age and now weight On metoprolol h/oCVA Has recovered well Blood pressure is controlled On Eliquis and statin CKD stage III Creatinine stable  Hyperlipidemia On Zocor LDL 59 in 03/20  Family/ staff Communication:   Labs/tests ordered:   Total time spent in this patient care encounter was  25_  minutes; greater than 50% of the visit spent counseling patient and staff, reviewing records , Labs and coordinating care for problems addressed at this encounter.

## 2019-01-09 ENCOUNTER — Encounter: Payer: Self-pay | Admitting: Internal Medicine

## 2019-01-09 ENCOUNTER — Telehealth: Payer: Self-pay | Admitting: Cardiology

## 2019-01-09 ENCOUNTER — Non-Acute Institutional Stay (SKILLED_NURSING_FACILITY): Payer: Medicare Other | Admitting: Internal Medicine

## 2019-01-09 DIAGNOSIS — N183 Chronic kidney disease, stage 3 unspecified: Secondary | ICD-10-CM

## 2019-01-09 DIAGNOSIS — S91101S Unspecified open wound of right great toe without damage to nail, sequela: Secondary | ICD-10-CM

## 2019-01-09 DIAGNOSIS — I480 Paroxysmal atrial fibrillation: Secondary | ICD-10-CM | POA: Diagnosis not present

## 2019-01-09 DIAGNOSIS — I739 Peripheral vascular disease, unspecified: Secondary | ICD-10-CM | POA: Diagnosis not present

## 2019-01-09 NOTE — Telephone Encounter (Signed)
New Message ° ° ° °Left message to confirm appt and answer covid questions  °

## 2019-01-09 NOTE — Progress Notes (Signed)
Location:  [facility] Room Number: [place] of Service:  SNF [phone]) Provider:  Dr. Clydene Fake, MD  Patient Care Team: Virgie Dad, MD as PCP - General (Internal Medicine) Buford Dresser, MD as PCP - Cardiology (Cardiology) Ricard Dillon, MD as Consulting Physician (Internal Medicine)  Extended Emergency Contact Information Primary Emergency Contact: Gordon,[place] Address: [address]          [place], [place] [address] [name] of [place] Phone: [phone] Relation: Daughter  Code Status:  FULL Goals of care: Advanced Directive information Advanced Directives 01/09/2019  Does Patient Have a Medical Advance Directive? Yes  Type of Paramedic of [place];Living will  Does patient want to make changes to medical advance directive? No - Patient declined  Copy of [place] in Chart? Yes - validated most recent copy scanned in chart (See row information)  Pre-existing out of facility DNR order (yellow form or pink MOST form) -     Chief Complaint  Patient presents with  . Acute Visit    Wound    HPI:  Pt is a 85 y.o. male seen today for an acute visit for Follow up of his wound in Right Foot  Patient has h/o Hypertension, Hyperlipidemia, Diabetes mellitus, h/o Brain stem Infarct s/p TPA In 10/19, Atrial Fibrillation on Eliquis since 10/19.,PAD s/p Left AKA  Patient has a history of chronic arterial wound on his right great toe.  He has seen vascular surgery and they have said it is unreconstructable. They think he would eventually need AKA.  Patient has refused to follow-up with them.  He also does not want to see wound care for now. His wound is stable.  He does have 2 wounds 1 on top of the toe and one on the lateral side.  He also is developing some redness in his second toe.  His foot is warm but he had mild edema and some redness.  He does have pain but mostly  controlled with Tylenol.  No fever or chills Patient also wanted me to send a letter saying that it is hard for him to take care of his financial and he is unable to sign due to arthritis.  He is making his daughters trustee to manage his assets.  His wife who has dementia and recently had a stroke is also in SNF Past Medical History:  Diagnosis Date  . BPH (benign prostatic hyperplasia) 10/14/2014  . Cervicalgia 01/04/2016  . Chronic kidney disease   . Contusion of left shoulder 05/27/14  . DM type 2 (diabetes mellitus, type 2) ([facility])   . Dysphagia   . High blood pressure   . History of basal cell cancer 11/21/2005   Right temple  . History of colonic polyps 10/14/2003  . Hyperlipidemia   . Left knee pain 05/27/14  . Peripheral vascular disease ([facility])   . SAH (subarachnoid hemorrhage) ([facility]) 2009   TBI  . Stroke ([facility])   . TIA (transient ischemic attack) 11/06/2005   Left eye pain. Slurred speech. Diaphoresis. No residual effects.    Past Surgical History:  Procedure Laterality Date  . ABDOMINAL AORTOGRAM W/LOWER EXTREMITY N/A 06/12/2018   Procedure: ABDOMINAL AORTOGRAM W/LOWER EXTREMITY;  Surgeon: Marty Heck, MD;  Location: [facility];  Service: Cardiovascular;  Laterality: N/A;  . Achilles tendon repair Left 1998   ruptured tendon  . AMPUTATION Left 10/11/2018   Procedure: Left  AMPUTATION ABOVE KNEE;  Surgeon: Marty Heck, MD;  Location: [facility];  Service: Vascular;  Laterality: Left;  . COLONOSCOPY  1992   Polyp removed  . COLONOSCOPY  1998   normal  . ORIF FEMUR FRACTURE Right 1929  . PERIPHERAL VASCULAR INTERVENTION Left 06/12/2018   Procedure: PERIPHERAL VASCULAR INTERVENTION;  Surgeon: Marty Heck, MD;  Location: [facility];  Service: Cardiovascular;  Laterality: Left;  Attempted   . TONSILLECTOMY      Allergies  Allergen Reactions  . Tape Other (See Comments)    TAPE WILL TEAR THE SKIN!!!!    Outpatient Encounter Medications as  of 01/09/2019  Medication Sig  . acetaminophen (TYLENOL 8 HOUR) 650 MG CR tablet Take 1 tablet (650 mg total) by mouth every 8 (eight) hours as needed for pain.  Marland Kitchen apixaban (ELIQUIS) 2.5 MG TABS tablet Take 2.5 mg by mouth 2 (two) times daily.  . Calcium Carb-Cholecalciferol (CALCIUM 600+D3 PO) Take 1 tablet by mouth daily.  . Cholecalciferol 1000 UNITS tablet Take 1,000 Units by mouth daily.  . ferrous sulfate 325 (65 FE) MG tablet Take 325 mg by mouth daily with breakfast. Take 1 tablet on Monday, Wednesday and Friday.  . gabapentin (NEURONTIN) 100 MG capsule TAKE ONE CAPSULE BY MOUTH EVERY NIGHT AT BEDTIME  . losartan (COZAAR) 25 MG tablet Take 25 mg by mouth daily.  . metFORMIN (GLUCOPHAGE) 500 MG tablet Take 1 tablet (500 mg total) by mouth 2 (two) times daily with a meal.  . metoprolol tartrate (LOPRESSOR) 25 MG tablet Take 1 tablet (25 mg total) by mouth 2 (two) times daily.  . Nutritional Supplements (BOOST GLUCOSE CONTROL) LIQD Take 237 mLs by mouth 2 (two) times a day. Twice a day between meals  . Propylene Glycol-Glycerin 1-0.3 % SOLN Place 2 drops into both eyes as needed (dry eyes).   . simvastatin (ZOCOR) 10 MG tablet TAKE ONE TABLET BY MOUTH DAILY  . zinc oxide 20 % ointment Apply 1 application topically as needed for irritation. Apply to buttocks after every incontinent episode and as needed for redness   No facility-administered encounter medications on file as of 01/09/2019.     Review of Systems  Constitutional: Negative.   HENT: Negative.   Respiratory: Negative.   Cardiovascular: Positive for leg swelling.  Gastrointestinal: Negative.   Genitourinary: Negative.   Musculoskeletal: Negative.   Skin: Positive for wound.  Neurological: Negative.   Psychiatric/Behavioral: Negative.     Immunization History  Administered Date(s) Administered  . DTaP 12/31/2012  . Influenza, High Dose Seasonal PF 04/04/2017  . Influenza,inj,Quad PF,6+ Mos 03/28/2018  .  Influenza-Unspecified 04/10/2014, 03/25/2015, 04/06/2016  . PPD Test 04/29/2014  . Pneumococcal Conjugate-13 04/28/2017  . Pneumococcal-Unspecified 03/13/2011  . Td 12/24/2012  . Tdap 04/28/2017  . Zoster Recombinat (Shingrix) 12/14/2017   Pertinent  Health Maintenance Due  Topic Date Due  . OPHTHALMOLOGY EXAM  07/07/2017  . FOOT EXAM  06/27/2018  . INFLUENZA VACCINE  01/25/2019  . HEMOGLOBIN A1C  06/18/2019  . PNA vac Low Risk Adult  Completed   Fall Risk  10/02/2018 09/11/2018 08/28/2018 08/08/2018 08/07/2018  Falls in the past year? 0 0 0 0 0  Number falls in past yr: 0 0 0 - 0  Injury with Fall? 0 0 0 - 0  Risk for fall due to : - - - - -   Functional Status Survey:    Vitals:   01/09/19 1032  BP: (!) 128/59  Pulse:  63  Resp: 18  Temp: 97.9 F (36.6 C)  TempSrc: Oral  SpO2: 91%  Weight: 125 lb 9.6 oz (57 kg)  Height: 5\' 8"  (1.727 m)   Body mass index is 19.1 kg/m. Physical Exam Vitals signs reviewed.  HENT:     Head: Normocephalic.     Nose: Nose normal.     Mouth/Throat:     Mouth: Mucous membranes are moist.     Pharynx: Oropharynx is clear.  Eyes:     Pupils: Pupils are equal, round, and reactive to light.  Neck:     Musculoskeletal: Neck supple.  Cardiovascular:     Rate and Rhythm: Normal rate. Rhythm irregular.     Pulses: Normal pulses.  Pulmonary:     Effort: Pulmonary effort is normal. No respiratory distress.     Breath sounds: No wheezing.  Abdominal:     General: Abdomen is flat. Bowel sounds are normal.     Palpations: Abdomen is soft.  Musculoskeletal:     Comments: Right Leg had Mild swelling Has 2 wounds on Top of Great toe with Necrotic tissue in the Base. Foot is warm And he does have redness. Skin abrasion on 2 toe  Neurological:     Mental Status: He is alert.     Labs reviewed: Recent Labs    10/11/18 0306  10/12/18 0312 10/13/18 0257 10/17/18 10/22/18 12/17/18  NA 134*  --  132* 134* 136* 132* 132*  K 4.6  --  4.9 4.3 4.7  4.5  --   CL 101  --  100 101 103  --   --   CO2 26  --  23 25 29   --   --   GLUCOSE 194*  --  243* 120*  --   --   --   BUN 26*  --  31* 26* 25* 20 38*  CREATININE 1.45*   < > 1.41* 1.30* 1.1 1.0 1.3  CALCIUM 7.9*  --  8.0* 7.7* 8.0  --   --    < > = values in this interval not displayed.   Recent Labs    08/29/18 0815 10/09/18 1352 10/09/18 1920 10/17/18 10/22/18  AST 15 23 22 22 16   ALT 13 22 21 14 11   ALKPHOS  --  64 68 67 69  BILITOT 0.7 0.8 0.7  --   --   PROT 6.0* 6.3* 6.2* 4.6  --   ALBUMIN  --  2.5* 2.5* 2.2  --    Recent Labs    04/01/18 1432  08/29/18 0815  10/11/18 1114 10/12/18 0312 10/13/18 0257 10/17/18 10/22/18 12/17/18  WBC 8.9   < > 11.7*   < > 16.8* 24.0* 16.6* 13.3 9.5  --   NEUTROABS 6.3  --  8,471*  --   --   --   --   --   --   --   HGB 12.7*   < > 12.0*   < > 10.8* 9.8* 8.7* 9.3* 11.4* 11.4*  HCT 38.4*   < > 34.5*   < > 32.4* 29.5* 26.9* 28* 33* 33*  MCV 104.9*   < > 99.7   < > 100.6* 99.3 101.1*  --   --   --   PLT 317   < > 455*   < > 420* 414* 421* 477* 483*  --    < > = values in this interval not displayed.   Lab Results  Component Value Date  TSH 1.69 10/24/2018   Lab Results  Component Value Date   HGBA1C 6.8 12/17/2018   Lab Results  Component Value Date   CHOL 126 08/29/2018   HDL 49 08/29/2018   LDLCALC 59 08/29/2018   TRIG 95 08/29/2018   CHOLHDL 2.6 08/29/2018    Significant Diagnostic Results in last 30 days:   Assessment/Plan Arterial Ulcer Has PAD unreconstructable Patient does not want to follow with Wound care or Vascular Continue Santyl.. Palliative Treatment Pain Controlled with Tyelenol LE swelling Will start him on low dose of Lasix   20 mg Qod Follow up BMP in 2 weeks Anemia Hgb   stable Continue iron Diabetes mellitus type 2 His Repeat A1C is 6.8 Continue on metformin CBG mostly Less then 200  Neuropathy Continue on low-dose of Neurontin Paroxysmal A. fib Tolerating Eliquis Does was  reduced to 2.5 mg as per Pharmacy suggestion due to his Age and now weight On metoprolol h/oCVA Has recovered well Blood pressure is controlled On Eliquis and statin CKD stage III Creatinine stable  Hyperlipidemia On Zocor LDL 59 in 03/20    Family/ staff Communication:   Labs/tests ordered:  BMP in 2 weeks Total time spent in this patient care encounter was  25_  minutes; greater than 50% of the visit spent counseling patient and staff, reviewing records , Labs and coordinating care for problems addressed at this encounter.

## 2019-01-10 ENCOUNTER — Ambulatory Visit (INDEPENDENT_AMBULATORY_CARE_PROVIDER_SITE_OTHER): Payer: Medicare Other | Admitting: Cardiology

## 2019-01-10 ENCOUNTER — Non-Acute Institutional Stay (SKILLED_NURSING_FACILITY): Payer: Medicare Other | Admitting: Nurse Practitioner

## 2019-01-10 ENCOUNTER — Encounter: Payer: Self-pay | Admitting: Nurse Practitioner

## 2019-01-10 ENCOUNTER — Encounter (INDEPENDENT_AMBULATORY_CARE_PROVIDER_SITE_OTHER): Payer: Self-pay

## 2019-01-10 ENCOUNTER — Other Ambulatory Visit: Payer: Self-pay

## 2019-01-10 VITALS — BP 118/60 | HR 71 | Ht 68.0 in | Wt 126.0 lb

## 2019-01-10 DIAGNOSIS — I480 Paroxysmal atrial fibrillation: Secondary | ICD-10-CM | POA: Diagnosis not present

## 2019-01-10 DIAGNOSIS — E1122 Type 2 diabetes mellitus with diabetic chronic kidney disease: Secondary | ICD-10-CM | POA: Diagnosis not present

## 2019-01-10 DIAGNOSIS — Z89612 Acquired absence of left leg above knee: Secondary | ICD-10-CM | POA: Diagnosis not present

## 2019-01-10 DIAGNOSIS — M792 Neuralgia and neuritis, unspecified: Secondary | ICD-10-CM | POA: Diagnosis not present

## 2019-01-10 DIAGNOSIS — E785 Hyperlipidemia, unspecified: Secondary | ICD-10-CM

## 2019-01-10 DIAGNOSIS — I739 Peripheral vascular disease, unspecified: Secondary | ICD-10-CM | POA: Diagnosis not present

## 2019-01-10 DIAGNOSIS — Z8673 Personal history of transient ischemic attack (TIA), and cerebral infarction without residual deficits: Secondary | ICD-10-CM

## 2019-01-10 DIAGNOSIS — L03115 Cellulitis of right lower limb: Secondary | ICD-10-CM | POA: Diagnosis not present

## 2019-01-10 DIAGNOSIS — I10 Essential (primary) hypertension: Secondary | ICD-10-CM

## 2019-01-10 DIAGNOSIS — N183 Chronic kidney disease, stage 3 (moderate): Secondary | ICD-10-CM

## 2019-01-10 DIAGNOSIS — R609 Edema, unspecified: Secondary | ICD-10-CM

## 2019-01-10 NOTE — Progress Notes (Signed)
Cardiology Office Note:    Date:  01/10/2019   ID:  Nathan Cook, DOB [birth year], MRN [id number]  PCP:  Virgie Dad, MD  Cardiologist:  Buford Dresser, MD PhD  Referring MD: Virgie Dad, MD   CC: follow up  History of Present Illness:    Nathan Cook is a 85 y.o. male with a hx of HTN, HLD, type II diabetes, peripheral vascular disease, recent CVA s/p tPA who is seen for follow up today.  Cardiac history: history of PVD, with critical limb ischemia and nonhealing foot wound s/p vascular surgery 06/12/18, but unfortunately the distal vessels could not be opened. He presented with acute CVA 03/2018 and received tPA for brainstem infarct. He had newly diagnosed atrial fibrillation during that hospitalization.  Today:  Waiting to move into assisted living. Wife had a stroke in the interim, he and his wife were in different facilities for a time. Had L AKA 10/11/2018 by Dr. Carlis Abbott, recovering well from this.   R foot with wound, has some pain with it. Tense edema to posterior thighs but improved from prior Having would dressed daily at his facility. No palpable pulses but feels warm.  Denies chest pain, shortness of breath at rest or with normal exertion. No PND, orthopnea, LE edema or unexpected weight gain. No syncope or palpitations.  Past Medical History:  Diagnosis Date  . BPH (benign prostatic hyperplasia) 10/14/2014  . Cervicalgia 01/04/2016  . Chronic kidney disease   . Contusion of left shoulder 05/27/14  . DM type 2 (diabetes mellitus, type 2) ([place] [facility])   . Dysphagia   . High blood pressure   . History of basal cell cancer 11/21/2005   Right temple  . History of colonic polyps 10/14/2003  . Hyperlipidemia   . Left knee pain 05/27/14  . Peripheral vascular disease ([place])   . SAH (subarachnoid hemorrhage) ([place]) 2009   TBI  . Stroke ([place])   . TIA (transient ischemic attack) 11/06/2005   Left eye pain. Slurred speech. Diaphoresis. No residual effects.      Past Surgical History:  Procedure Laterality Date  . ABDOMINAL AORTOGRAM W/LOWER EXTREMITY N/A 06/12/2018   Procedure: ABDOMINAL AORTOGRAM W/LOWER EXTREMITY;  Surgeon: Marty Heck, MD;  Location: [facility];  Service: Cardiovascular;  Laterality: N/A;  . Achilles tendon repair Left 1998   ruptured tendon  . AMPUTATION Left 10/11/2018   Procedure: Left  AMPUTATION ABOVE KNEE;  Surgeon: Marty Heck, MD;  Location: [facility];  Service: Vascular;  Laterality: Left;  . COLONOSCOPY  1992   Polyp removed  . COLONOSCOPY  1998   normal  . ORIF FEMUR FRACTURE Right 1929  . PERIPHERAL VASCULAR INTERVENTION Left 06/12/2018   Procedure: PERIPHERAL VASCULAR INTERVENTION;  Surgeon: Marty Heck, MD;  Location: [facility];  Service: Cardiovascular;  Laterality: Left;  Attempted   . TONSILLECTOMY      Current Medications: Current Outpatient Medications on File Prior to Visit  Medication Sig  . acetaminophen (TYLENOL 8 HOUR) 650 MG CR tablet Take 1 tablet (650 mg total) by mouth every 8 (eight) hours as needed for pain.  Marland Kitchen apixaban (ELIQUIS) 2.5 MG TABS tablet Take 2.5 mg by mouth 2 (two) times daily.  . Calcium Carb-Cholecalciferol (CALCIUM 600+D3 PO) Take 1 tablet by mouth daily.  . Cholecalciferol 1000 UNITS tablet Take 1,000 Units by mouth daily.  . ferrous sulfate 325 (65 FE) MG tablet Take 325 mg by mouth daily with breakfast.  Take 1 tablet on Monday, Wednesday and Friday.  . gabapentin (NEURONTIN) 100 MG capsule TAKE ONE CAPSULE BY MOUTH EVERY NIGHT AT BEDTIME  . losartan (COZAAR) 25 MG tablet Take 25 mg by mouth daily.  . metFORMIN (GLUCOPHAGE) 500 MG tablet Take 1 tablet (500 mg total) by mouth 2 (two) times daily with a meal.  . metoprolol tartrate (LOPRESSOR) 25 MG tablet Take 1 tablet (25 mg total) by mouth 2 (two) times daily.  . Nutritional Supplements (BOOST GLUCOSE CONTROL) LIQD Take 237 mLs by mouth 2 (two) times a day. Twice a day between meals  .  Propylene Glycol-Glycerin 1-0.3 % SOLN Place 2 drops into both eyes as needed (dry eyes).   . simvastatin (ZOCOR) 10 MG tablet TAKE ONE TABLET BY MOUTH DAILY  . zinc oxide 20 % ointment Apply 1 application topically as needed for irritation. Apply to buttocks after every incontinent episode and as needed for redness   No current facility-administered medications on file prior to visit.      Allergies:   Tape   Social History   Socioeconomic History  . Marital status: Married    Spouse name: Not on file  . Number of children: Not on file  . Years of education: Not on file  . Highest education level: Not on file  Occupational History  . Occupation: retired Copy  . Financial resource strain: Not on file  . Food insecurity    Worry: Not on file    Inability: Not on file  . Transportation needs    Medical: Not on file    Non-medical: Not on file  Tobacco Use  . Smoking status: Former Smoker    Quit date: 06/26/1971    Years since quitting: 47.5  . Smokeless tobacco: Never Used  . Tobacco comment: 2 1/2 packs a day, until 1972  Substance and Sexual Activity  . Alcohol use: Yes    Alcohol/week: 1.0 - 2.0 standard drinks    Types: 1 - 2 Glasses of wine per week    Comment: Wine nightly  . Drug use: No  . Sexual activity: Not on file  Lifestyle  . Physical activity    Days per week: Not on file    Minutes per session: Not on file  . Stress: Not on file  Relationships  . Social Herbalist on phone: Not on file    Gets together: Not on file    Attends religious service: Not on file    Active member of club or organization: Not on file    Attends meetings of clubs or organizations: Not on file    Relationship status: Not on file  Other Topics Concern  . Not on file  Social History Narrative   Patient lives at [facility] since Dec 2015   Caffeine- Coffee   Married- Yes, 1951   [employer] 1943-46   House- Apartment with 2 people   Pets-  No   Current/past profession- Press photographer   Exercise- Yes, walking   Living will- Yes   DNR- Yes   POA/HPOA- Yes        Family History: The patient's family history includes Heart attack in his mother; Heart disease in his father and mother.  ROS:   Please see the history of present illness.  Additional pertinent ROS:  Constitutional: Negative for chills, fever, night sweats, unintentional weight loss  HENT: Negative for ear pain and hearing loss.  Eyes: Negative for loss of vision and eye pain.  Respiratory: Negative for cough, sputum, wheezing.   Cardiovascular: See HPI. Gastrointestinal: Negative for abdominal pain, melena, and hematochezia.  Genitourinary: Negative for dysuria and hematuria.  Musculoskeletal: Negative for falls and myalgias. RLE with chronic wound and edema Skin: Negative for itching and rash.  Neurological: Negative for focal weakness, focal sensory changes and loss of consciousness.  Endo/Heme/Allergies: Does not bruise/bleed easily.    EKGs/Labs/Other Studies Reviewed:    The following studies were reviewed today: Echo 04/02/18 Study Conclusions - Left ventricle: The cavity size was normal. Wall thickness was   normal. Systolic function was normal. The estimated ejection   fraction was in the range of 60% to 65%. Wall motion was normal;   there were no regional wall motion abnormalities. Doppler   parameters are consistent with abnormal left ventricular   relaxation (grade 1 diastolic dysfunction). - Aortic valve: Valve mobility was restricted. There was moderate   stenosis. There was mild regurgitation. Valve area (VTI): 1.04   cm^2. Valve area (Vmax): 0.82 cm^2. Valve area (Vmean): 0.99   cm^2.  Impressions: - Normal LV systolic function; mild diastolic dysfunction;   calcified aortic valve with moderate AS (mean gradient 21 mmHg)   and mild AI.  EKG:  EKG is personally reviewed.  The ekg ordered 10/09/18 demonstrates sinus rhythm with RBBB and  1st degree AV block.   Recent Labs: 10/22/2018: ALT 11; Platelets 483; Potassium 4.5 10/24/2018: TSH 1.69 12/17/2018: BUN 38; Creatinine 1.3; Hemoglobin 11.4; Sodium 132  Recent Lipid Panel    Component Value Date/Time   CHOL 126 08/29/2018 0815   TRIG 95 08/29/2018 0815   HDL 49 08/29/2018 0815   CHOLHDL 2.6 08/29/2018 0815   VLDL 6 04/02/2018 0259   LDLCALC 59 08/29/2018 0815    Physical Exam:    VS:  BP 118/60   Pulse 71   Ht 5\' 8"  (1.727 m)   Wt 126 lb (57.2 kg)   SpO2 98%   BMI 19.16 kg/m     Wt Readings from Last 3 Encounters:  01/10/19 126 lb (57.2 kg)  01/10/19 125 lb 9.6 oz (57 kg)  01/09/19 125 lb 9.6 oz (57 kg)    GEN: Well nourished, well developed in no acute distress HEENT: Normal NECK: No JVD CARDIAC: regular rhythm, normal S1 and S2, no rubs, gallops. 3/6 SEM with preserved S2.  Radial pulses 2+ bilaterally. RESPIRATORY:  Clear to auscultation without rales, wheezing or rhonchi  ABDOMEN: Soft, non-tender, non-distended MUSCULOSKELETAL: S/P L AKA. RLE with edema, felt to thigh level, with dressed foot wound. Warm but no palpable distal pulse. SKIN: Warm and dry NEUROLOGIC:  Alert and oriented x 3 PSYCHIATRIC:  Normal affect   ASSESSMENT:    1. PAD (peripheral artery disease) (HCC)   2. Paroxysmal atrial fibrillation ([place])   3. S/P AKA (above knee amputation) unilateral, left ([place])   4. Controlled type 2 diabetes mellitus with stage 3 chronic kidney disease, without long-term current use of insulin ([place])   5. History of CVA (cerebrovascular accident)   6. Hyperlipidemia LDL goal <70   7. Essential hypertension    PLAN:    PAD, with history of critical limb ischemia: s/p L AKA, and RLE with continued edema and wound. -palliative wound care. Followed by Dr. Carlis Abbott -edema managed with furosemide 20 mg M/W/F with potassium 10 meq same days  Paroxysmal atrial fibrillation, diagnosed at the time of CVA:  -CHA2DS2/VAS Stroke Risk Points  =  7   -continue  apixaban for anticoagulation. Down to 2.5 mg BID dosing due to age/weight -tolerating metoprolol for rate control of afib (in SR today by exam)  History of CVA:  -on statin for secondary prevention, LDL goal <70. Last LDL 59 08/29/18 on simvastatin 10 mg -afib management as above  Hypertension goal <130/80, at goal today -continue losartan 25 mg daily   Type II diabetes: on metformin, per PCP. A1c 6.8 Chronic kidney disease, stage 3: avoid nephrotoxic agents.  Plan for follow up: 6 mos or sooner PRN  Medication Adjustments/Labs and Tests Ordered: Current medicines are reviewed at length with the patient today.  Concerns regarding medicines are outlined above.  No orders of the defined types were placed in this encounter.  No orders of the defined types were placed in this encounter.   Patient Instructions  Medication Instructions:  Your Physician recommend you continue on your current medication as directed.    If you need a refill on your cardiac medications before your next appointment, please call your pharmacy.   Lab work: None  Testing/Procedures: None  Follow-Up: At [facility], you and your health needs are our priority.  As part of our continuing mission to provide you with exceptional heart care, we have created designated Provider Care Teams.  These Care Teams include your primary Cardiologist (physician) and Advanced Practice Providers (APPs -  Physician Assistants and Nurse Practitioners) who all work together to provide you with the care you need, when you need it. You will need a follow up appointment in 6 months.  Please call our office 2 months in advance to schedule this appointment.  You may see Buford Dresser, MD or one of the following Advanced Practice Providers on your designated Care Team:   Rosaria Ferries, PA-C . Jory Sims, DNP, ANP        Signed, Buford Dresser, MD PhD 01/10/2019 2:24 PM    [place]

## 2019-01-10 NOTE — Progress Notes (Signed)
Location:   SNF [place] Room Number: 35/A Place of Service:  SNF (31) Provider: [facility] Bor[place]t NP  Virgie Dad, MD  Patient Care Team: Virgie Dad, MD as PCP - General (Internal Medicine) Buford Dresser, MD as PCP - Cardiology (Cardiology) Ricard Dillon, MD as Consulting Physician (Internal Medicine)  Extended Emergency Contact Information Primary Emergency Contact: Gordon,[place] Address: [address]          [place], [place] [address] [name] of [place] Phone: [phone] Relation: Daughter  Code Status:  DNR Goals of care: Advanced Directive information Advanced Directives 01/10/2019  Does Patient Have a Medical Advance Directive? Yes  Type of Advance Directive Living will;Healthcare Power of Attorney  Does patient want to make changes to medical advance directive? No - Patient declined  Copy of [place] in Chart? Yes - validated most recent copy scanned in chart (See row information)  Pre-existing out of facility DNR order (yellow form or pink MOST form) -     Chief Complaint  Patient presents with  . Acute Visit    Right foot wound     HPI:  Pt is a 85 y.o. male seen today for an acute visit for right foot worsened redness, swelling, and warmth, the patient reported new gradual onset pain in the right foot in the past a few day. He denied trauma or generalized malaise or fever. Hx of PAD s/p left AKA. Medial right great toe arterial ulcer, unreconstructable per vascular surgeon, treated with Santyl for palliative care. Hx of T2DM, on Metformin 500mg  bid, last Hgb a1c 6.8 12/17/18. Peripheral neuropathy, managed with Gabapentin 100mg  qd. Edema RLE, on Furosemide 20mg  qd. Afib, heart rate is in control, on Metoprolol 25mg  bid, Eliquis 2.5mg  bid.    Past Medical History:  Diagnosis Date  . BPH (benign prostatic hyperplasia) 10/14/2014  . Cervicalgia 01/04/2016  . Chronic kidney disease   . Contusion of left shoulder  05/27/14  . DM type 2 (diabetes mellitus, type 2) ([place])   . Dysphagia   . High blood pressure   . History of basal cell cancer 11/21/2005   Right temple  . History of colonic polyps 10/14/2003  . Hyperlipidemia   . Left knee pain 05/27/14  . Peripheral vascular disease ([place])   . SAH (subarachnoid hemorrhage) ([place]) 2009   TBI  . Stroke ([place])   . TIA (transient ischemic attack) 11/06/2005   Left eye pain. Slurred speech. Diaphoresis. No residual effects.    Past Surgical History:  Procedure Laterality Date  . ABDOMINAL AORTOGRAM W/LOWER EXTREMITY N/A 06/12/2018   Procedure: ABDOMINAL AORTOGRAM W/LOWER EXTREMITY;  Surgeon: Marty Heck, MD;  Location: [facility];  Service: Cardiovascular;  Laterality: N/A;  . Achilles tendon repair Left 1998   ruptured tendon  . AMPUTATION Left 10/11/2018   Procedure: Left  AMPUTATION ABOVE KNEE;  Surgeon: Marty Heck, MD;  Location: [facility];  Service: Vascular;  Laterality: Left;  . COLONOSCOPY  1992   Polyp removed  . COLONOSCOPY  1998   normal  . ORIF FEMUR FRACTURE Right 1929  . PERIPHERAL VASCULAR INTERVENTION Left 06/12/2018   Procedure: PERIPHERAL VASCULAR INTERVENTION;  Surgeon: Marty Heck, MD;  Location: [facility];  Service: Cardiovascular;  Laterality: Left;  Attempted   . TONSILLECTOMY      Allergies  Allergen Reactions  . Tape Other (See Comments)    TAPE WILL TEAR THE SKIN!!!!    Allergies as  of 01/10/2019      Reactions   Tape Other (See Comments)   TAPE WILL TEAR THE SKIN!!!!      Medication List       Accurate as of January 10, 2019 11:59 PM. If you have any questions, ask your nurse or doctor.        acetaminophen 650 MG CR tablet Commonly known as: Tylenol 8 Hour Take 1 tablet (650 mg total) by mouth every 8 (eight) hours as needed for pain.   Boost Glucose Control Liqd Take 237 mLs by mouth 2 (two) times a day. Twice a day between meals   CALCIUM 600+D3 PO Take 1 tablet by  mouth daily.   Cholecalciferol 25 MCG (1000 UT) tablet Take 1,000 Units by mouth daily.   Eliquis 2.5 MG Tabs tablet Generic drug: apixaban Take 2.5 mg by mouth 2 (two) times daily.   ferrous sulfate 325 (65 FE) MG tablet Take 325 mg by mouth daily with breakfast. Take 1 tablet on Monday, Wednesday and Friday.   furosemide 20 MG tablet Commonly known as: LASIX Take 20 mg by mouth daily. Mon,Wed,Fri 9am   gabapentin 100 MG capsule Commonly known as: NEURONTIN TAKE ONE CAPSULE BY MOUTH EVERY NIGHT AT BEDTIME   losartan 25 MG tablet Commonly known as: COZAAR Take 25 mg by mouth daily.   metFORMIN 500 MG tablet Commonly known as: GLUCOPHAGE Take 1 tablet (500 mg total) by mouth 2 (two) times daily with a meal.   metoprolol tartrate 25 MG tablet Commonly known as: LOPRESSOR Take 1 tablet (25 mg total) by mouth 2 (two) times daily.   potassium chloride 10 MEQ tablet Commonly known as: K-DUR Take 10 mEq by mouth daily. Mon.Wed,Fri 9am   Propylene Glycol-Glycerin 1-0.3 % Soln Place 2 drops into both eyes as needed (dry eyes).   simvastatin 10 MG tablet Commonly known as: ZOCOR TAKE ONE TABLET BY MOUTH DAILY   zinc oxide 20 % ointment Apply 1 application topically as needed for irritation. Apply to buttocks after every incontinent episode and as needed for redness       Review of Systems  Constitutional: Negative for activity change, appetite change, chills, diaphoresis, fatigue and fever.  HENT: Positive for hearing loss. Negative for congestion and voice change.   Respiratory: Negative for cough, shortness of breath and wheezing.   Cardiovascular: Positive for leg swelling. Negative for chest pain and palpitations.  Gastrointestinal: Negative for abdominal distention, abdominal pain, constipation, diarrhea, nausea and vomiting.  Musculoskeletal: Positive for gait problem.       Right foot pain, wound. S/p L AKA  Skin: Positive for color change and wound.   Neurological: Negative for dizziness, speech difficulty and weakness.  Psychiatric/Behavioral: Negative for agitation, behavioral problems and hallucinations. The patient is not nervous/anxious.     Immunization History  Administered Date(s) Administered  . DTaP 12/31/2012  . Influenza, High Dose Seasonal PF 04/04/2017  . Influenza,inj,Quad PF,6+ Mos 03/28/2018  . Influenza-Unspecified 04/10/2014, 03/25/2015, 04/06/2016  . PPD Test 04/29/2014  . Pneumococcal Conjugate-13 04/28/2017  . Pneumococcal-Unspecified 03/13/2011  . Td 12/24/2012  . Tdap 04/28/2017  . Zoster Recombinat (Shingrix) 12/14/2017   Pertinent  Health Maintenance Due  Topic Date Due  . OPHTHALMOLOGY EXAM  07/07/2017  . FOOT EXAM  06/27/2018  . INFLUENZA VACCINE  01/25/2019  . HEMOGLOBIN A1C  06/18/2019  . PNA vac Low Risk Adult  Completed   Fall Risk  10/02/2018 09/11/2018 08/28/2018 08/08/2018 08/07/2018  Falls in the past  year? 0 0 0 0 0  Number falls in past yr: 0 0 0 - 0  Injury with Fall? 0 0 0 - 0  Risk for fall due to : - - - - -   Functional Status Survey:    Vitals:   01/10/19 1111  BP: (!) 128/59  Pulse: 63  Resp: 20  Temp: (!) 97.4 F (36.3 C)  SpO2: 92%  Weight: 125 lb 9.6 oz (57 kg)  Height: 5\' 8"  (1.727 m)   Body mass index is 19.1 kg/m. Physical Exam Vitals signs and nursing note reviewed.  Constitutional:      General: He is not in acute distress.    Appearance: Normal appearance. He is not ill-appearing, toxic-appearing or diaphoretic.  HENT:     Head: Normocephalic and atraumatic.     Nose: Nose normal. No congestion.     Mouth/Throat:     Mouth: Mucous membranes are moist.  Eyes:     Extraocular Movements: Extraocular movements intact.     Conjunctiva/sclera: Conjunctivae normal.     Pupils: Pupils are equal, round, and reactive to light.  Neck:     Musculoskeletal: Normal range of motion and neck supple.  Cardiovascular:     Rate and Rhythm: Normal rate. Rhythm irregular.      Pulses:          Dorsalis pedis pulses are 0 on the right side.     Heart sounds: No murmur.  Pulmonary:     Effort: Pulmonary effort is normal.     Breath sounds: No wheezing, rhonchi or rales.  Musculoskeletal:     Right lower leg: Edema present.     Comments: 1+ edema RLE/foot. W/c for mobility, s/p L AKA  Skin:    Findings: Erythema present.     Comments: Medial left great toe arterial ulcer, treated with Santyl. Right foot warmth, swelling, pain,  and erythematous.  Neurological:     General: No focal deficit present.     Mental Status: He is alert and oriented to person, place, and time. Mental status is at baseline.     Cranial Nerves: No cranial nerve deficit.     Motor: No weakness.     Coordination: Coordination normal.     Gait: Gait abnormal.  Psychiatric:        Mood and Affect: Mood normal.        Behavior: Behavior normal.        Thought Content: Thought content normal.        Judgment: Judgment normal.     Labs reviewed: Recent Labs    10/11/18 0306  10/12/18 0312 10/13/18 0257 10/17/18 10/22/18 12/17/18  NA 134*  --  132* 134* 136* 132* 132*  K 4.6  --  4.9 4.3 4.7 4.5  --   CL 101  --  100 101 103  --   --   CO2 26  --  23 25 29   --   --   GLUCOSE 194*  --  243* 120*  --   --   --   BUN 26*  --  31* 26* 25* 20 38*  CREATININE 1.45*   < > 1.41* 1.30* 1.1 1.0 1.3  CALCIUM 7.9*  --  8.0* 7.7* 8.0  --   --    < > = values in this interval not displayed.   Recent Labs    08/29/18 0815 10/09/18 1352 10/09/18 1920 10/17/18 10/22/18  AST 15 23 22 22  16  ALT 13 22 21 14 11   ALKPHOS  --  64 68 67 69  BILITOT 0.7 0.8 0.7  --   --   PROT 6.0* 6.3* 6.2* 4.6  --   ALBUMIN  --  2.5* 2.5* 2.2  --    Recent Labs    04/01/18 1432  08/29/18 0815  10/11/18 1114 10/12/18 0312 10/13/18 0257 10/17/18 10/22/18 12/17/18  WBC 8.9   < > 11.7*   < > 16.8* 24.0* 16.6* 13.3 9.5  --   NEUTROABS 6.3  --  8,471*  --   --   --   --   --   --   --   HGB 12.7*   < >  12.0*   < > 10.8* 9.8* 8.7* 9.3* 11.4* 11.4*  HCT 38.4*   < > 34.5*   < > 32.4* 29.5* 26.9* 28* 33* 33*  MCV 104.9*   < > 99.7   < > 100.6* 99.3 101.1*  --   --   --   PLT 317   < > 455*   < > 420* 414* 421* 477* 483*  --    < > = values in this interval not displayed.   Lab Results  Component Value Date   TSH 1.69 10/24/2018   Lab Results  Component Value Date   HGBA1C 6.8 12/17/2018   Lab Results  Component Value Date   CHOL 126 08/29/2018   HDL 49 08/29/2018   LDLCALC 59 08/29/2018   TRIG 95 08/29/2018   CHOLHDL 2.6 08/29/2018    Significant Diagnostic Results in last 30 days:  No results found.  Assessment/Plan Cellulitis of right foot Worsened redness, warmth, tenderness of the right foot In setting of PAD with arterial wound and T2DM. Will treat with Doxycycline 100mg  bid x 10 days, Florastor bid x 10 days. Observe.   Controlled type 2 diabetes mellitus with stage 3 chronic kidney disease, without long-term current use of insulin (HCC) Continue Metformin 500mg  bid. Last Hgb a1c 6.8 12/17/18  Neuropathic pain Stable, continue Gabapentin 100mg  qd.   Paroxysmal atrial fibrillation (HCC) Heart rate is in control, continue Metoprolol 25mg  bid, Eliquis 2.5mg  bid for risk reduction of thromboembolic events.   Edema Mild edema RLE, continue Furosemide 20mg  qd.     Family/ staff Communication: plan of care reviewed with the patient and charge nurse.   Labs/tests ordered:  none  Time spend 25 minutes

## 2019-01-10 NOTE — Patient Instructions (Signed)

## 2019-01-10 NOTE — Assessment & Plan Note (Addendum)
Worsened redness, warmth, tenderness of the right foot In setting of PAD with arterial wound and T2DM. Will treat with Doxycycline 100mg  bid x 10 days, Florastor bid x 10 days. Observe.

## 2019-01-12 ENCOUNTER — Encounter: Payer: Self-pay | Admitting: Nurse Practitioner

## 2019-01-12 DIAGNOSIS — R609 Edema, unspecified: Secondary | ICD-10-CM | POA: Insufficient documentation

## 2019-01-12 NOTE — Assessment & Plan Note (Signed)
Heart rate is in control, continue Metoprolol 25mg  bid, Eliquis 2.5mg  bid for risk reduction of thromboembolic events.

## 2019-01-12 NOTE — Assessment & Plan Note (Signed)
Stable, continue Gabapentin 100mg  qd.

## 2019-01-12 NOTE — Assessment & Plan Note (Signed)
Mild edema RLE, continue Furosemide 20mg  qd.

## 2019-01-12 NOTE — Assessment & Plan Note (Signed)
Continue Metformin 500mg  bid. Last Hgb a1c 6.8 12/17/18

## 2019-01-16 ENCOUNTER — Encounter: Payer: Self-pay | Admitting: Cardiology

## 2019-01-16 DIAGNOSIS — Z8673 Personal history of transient ischemic attack (TIA), and cerebral infarction without residual deficits: Secondary | ICD-10-CM | POA: Insufficient documentation

## 2019-01-20 ENCOUNTER — Encounter: Payer: Self-pay | Admitting: Nurse Practitioner

## 2019-01-20 ENCOUNTER — Non-Acute Institutional Stay (SKILLED_NURSING_FACILITY): Payer: Medicare Other | Admitting: Nurse Practitioner

## 2019-01-20 DIAGNOSIS — E1122 Type 2 diabetes mellitus with diabetic chronic kidney disease: Secondary | ICD-10-CM

## 2019-01-20 DIAGNOSIS — I739 Peripheral vascular disease, unspecified: Secondary | ICD-10-CM

## 2019-01-20 DIAGNOSIS — S51019A Laceration without foreign body of unspecified elbow, initial encounter: Secondary | ICD-10-CM | POA: Insufficient documentation

## 2019-01-20 DIAGNOSIS — R609 Edema, unspecified: Secondary | ICD-10-CM

## 2019-01-20 DIAGNOSIS — I129 Hypertensive chronic kidney disease with stage 1 through stage 4 chronic kidney disease, or unspecified chronic kidney disease: Secondary | ICD-10-CM

## 2019-01-20 DIAGNOSIS — I480 Paroxysmal atrial fibrillation: Secondary | ICD-10-CM | POA: Diagnosis not present

## 2019-01-20 DIAGNOSIS — N183 Chronic kidney disease, stage 3 unspecified: Secondary | ICD-10-CM

## 2019-01-20 DIAGNOSIS — M792 Neuralgia and neuritis, unspecified: Secondary | ICD-10-CM

## 2019-01-20 DIAGNOSIS — D500 Iron deficiency anemia secondary to blood loss (chronic): Secondary | ICD-10-CM

## 2019-01-20 NOTE — Assessment & Plan Note (Signed)
Trace edema RLE, continue Furosemide 20mg  qd.

## 2019-01-20 NOTE — Progress Notes (Signed)
Location:   SNF [place] Room Number: 35 Place of Service:  SNF (31) Provider: [facility] Lasaundra Riche NP  Virgie Dad, MD  Patient Care Team: Virgie Dad, MD as PCP - General (Internal Medicine) Buford Dresser, MD as PCP - Cardiology (Cardiology) Ricard Dillon, MD as Consulting Physician (Internal Medicine)  Extended Emergency Contact Information Primary Emergency Contact: Gordon,[place] Address: [address]          [place], [place] [address] [name] of [place] Phone: [phone] Relation: Daughter  Code Status:  DNR Goals of care: Advanced Directive information Advanced Directives 01/20/2019  Does Patient Have a Medical Advance Directive? Yes  Type of Advance Directive Living will;Healthcare Power of Attorney  Does patient want to make changes to medical advance directive? No - Patient declined  Copy of [place] in Chart? Yes - validated most recent copy scanned in chart (See row information)  Pre-existing out of facility DNR order (yellow form or pink MOST form) -     Chief Complaint  Patient presents with  . Medical Management of Chronic Issues    routine Visit   . Health Maintenance    ophthalmology exam, foot exam     HPI:  Pt is a 85 y.o. male seen today for medical management of chronic diseases.    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 He sustained a skin tear @ right elbow from fall when transferred from bed to w/c 01/17/19, no s/s of infection. H of T2DM, blood sugar is controlled, on Metformin 500mg  bid, last Hgb a1c 6.8 12/17/18. Afib, heart rate is in control, on Metoprolol 32m bid, Eliquis 2.5mg  bid.  HTN, blood pressure is controlled, taking Losartan 100mg  qd. Edema, trace in RLE,  on Furosemide 20mg  qd. Neuropathic pain, stable on Gabapentin 100mg  qd. Anemia, stable on Fe daily, last Hgb 11.4 12/17/18 Past Medical History:  Diagnosis Date  . BPH (benign prostatic hyperplasia) 10/14/2014  . Cervicalgia 01/04/2016  . Chronic kidney disease   . Contusion of left shoulder 05/27/14  . DM type 2 (diabetes mellitus, type 2) ([place])   . Dysphagia   . High blood pressure   . History of basal cell cancer 11/21/2005   Right temple  . History of colonic polyps 10/14/2003  . Hyperlipidemia   . Left knee pain 05/27/14  . Peripheral vascular disease ([place])   . SAH (subarachnoid hemorrhage) ([place]) 2009   TBI  . Stroke ([place])   . TIA (transient ischemic attack) 11/06/2005   Left eye pain. Slurred speech. Diaphoresis. No residual effects.    Past Surgical History:  Procedure Laterality Date  . ABDOMINAL AORTOGRAM W/LOWER EXTREMITY N/A 06/12/2018   Procedure: ABDOMINAL AORTOGRAM W/LOWER EXTREMITY;  Surgeon: Marty Heck, MD;  Location: [facility];  Service: Cardiovascular;  Laterality: N/A;  . Achilles tendon repair Left 1998   ruptured tendon  . AMPUTATION Left 10/11/2018   Procedure: Left  AMPUTATION ABOVE KNEE;  Surgeon: Marty Heck, MD;  Location: [facility];  Service: Vascular;  Laterality: Left;  . COLONOSCOPY  1992   Polyp removed  . COLONOSCOPY  1998   normal  . ORIF FEMUR FRACTURE Right 1929  . PERIPHERAL VASCULAR INTERVENTION Left  06/12/2018   Procedure: PERIPHERAL VASCULAR INTERVENTION;  Surgeon: Marty Heck, MD;  Location: [facility];  Service: Cardiovascular;  Laterality: Left;  Attempted   . TONSILLECTOMY      Allergies  Allergen Reactions  . Tape Other (See Comments)    TAPE WILL TEAR THE SKIN!!!!    Allergies as of 01/20/2019      Reactions   Tape Other (See Comments)   TAPE WILL TEAR THE SKIN!!!!      Medication List       Accurate as of January 20, 2019  4:21 PM. If you have any questions, ask your nurse or doctor.         acetaminophen 650 MG CR tablet Commonly known as: Tylenol 8 Hour Take 1 tablet (650 mg total) by mouth every 8 (eight) hours as needed for pain.   Boost Glucose Control Liqd Take 237 mLs by mouth 2 (two) times a day. Twice a day between meals   CALCIUM 600+D3 PO Take 1 tablet by mouth daily.   Cholecalciferol 25 MCG (1000 UT) tablet Take 1,000 Units by mouth daily.   doxycycline 100 MG tablet Commonly known as: VIBRA-TABS Take 100 mg by mouth 2 (two) times daily.   Eliquis 2.5 MG Tabs tablet Generic drug: apixaban Take 2.5 mg by mouth 2 (two) times daily.   ferrous sulfate 325 (65 FE) MG tablet Take 325 mg by mouth daily with breakfast. Take 1 tablet on Monday, Wednesday and Friday.   furosemide 20 MG tablet Commonly known as: LASIX Take 20 mg by mouth daily. Mon,Wed,Fri 9am   gabapentin 100 MG capsule Commonly known as: NEURONTIN TAKE ONE CAPSULE BY MOUTH EVERY NIGHT AT BEDTIME   losartan 25 MG tablet Commonly known as: COZAAR Take 25 mg by mouth daily.   metFORMIN 500 MG tablet Commonly known as: GLUCOPHAGE Take 1 tablet (500 mg total) by mouth 2 (two) times daily with a meal.   metoprolol tartrate 25 MG tablet Commonly known as: LOPRESSOR Take 1 tablet (25 mg total) by mouth 2 (two) times daily.   potassium chloride 10 MEQ tablet Commonly known as: K-DUR Take 10 mEq by mouth daily. Mon.Wed,Fri 9am   Propylene Glycol-Glycerin 1-0.3 % Soln Place 2 drops into both eyes as needed (dry eyes).   saccharomyces boulardii 250 MG capsule Commonly known as: FLORASTOR Take 250 mg by mouth 2 (two) times daily.   simvastatin 10 MG tablet Commonly known as: ZOCOR TAKE ONE TABLET BY MOUTH DAILY   zinc oxide 20 % ointment Apply 1 application topically as needed for irritation. Apply to buttocks after every incontinent episode and as needed for redness      ROS was provided with assistance of staff.  Review of Systems  Constitutional: Negative for activity  change, appetite change, chills, diaphoresis, fatigue, fever and unexpected weight change.  HENT: Positive for hearing loss. Negative for congestion and voice change.   Eyes: Negative for visual disturbance.  Respiratory: Negative for cough, shortness of breath and wheezing.   Cardiovascular: Positive for leg swelling. Negative for chest pain and palpitations.  Gastrointestinal: Negative for abdominal distention, abdominal pain, constipation, diarrhea, nausea and vomiting.  Genitourinary: Negative for difficulty urinating, dysuria and urgency.  Musculoskeletal: Positive for arthralgias and gait problem.  Skin: Positive for color change and wound.  Neurological: Negative for dizziness, speech difficulty, weakness and headaches.       Memory lapses.   Psychiatric/Behavioral: Negative for agitation, behavioral problems, hallucinations and sleep disturbance. The  patient is not nervous/anxious.     Immunization History  Administered Date(s) Administered  . DTaP 12/31/2012  . Influenza, High Dose Seasonal PF 04/04/2017  . Influenza,inj,Quad PF,6+ Mos 03/28/2018  . Influenza-Unspecified 04/10/2014, 03/25/2015, 04/06/2016  . PPD Test 04/29/2014  . Pneumococcal Conjugate-13 04/28/2017  . Pneumococcal-Unspecified 03/13/2011  . Td 12/24/2012  . Tdap 04/28/2017  . Zoster Recombinat (Shingrix) 12/14/2017   Pertinent  Health Maintenance Due  Topic Date Due  . OPHTHALMOLOGY EXAM  07/07/2017  . FOOT EXAM  06/27/2018  . INFLUENZA VACCINE  01/25/2019  . HEMOGLOBIN A1C  06/18/2019  . PNA vac Low Risk Adult  Completed   Fall Risk  10/02/2018 09/11/2018 08/28/2018 08/08/2018 08/07/2018  Falls in the past year? 0 0 0 0 0  Number falls in past yr: 0 0 0 - 0  Injury with Fall? 0 0 0 - 0  Risk for fall due to : - - - - -   Functional Status Survey:    Vitals:   01/20/19 1100  BP: 137/72  Pulse: 85  Resp: 19  Temp: (!) 97 F (36.1 C)  SpO2: 95%  Weight: 126 lb 4.8 oz (57.3 kg)  Height: 5\' 8"   (1.727 m)   Body mass index is 19.2 kg/m. Physical Exam Vitals signs and nursing note reviewed.  Constitutional:      General: He is not in acute distress.    Appearance: Normal appearance. He is not ill-appearing, toxic-appearing or diaphoretic.  HENT:     Head: Normocephalic and atraumatic.     Nose: Nose normal.     Mouth/Throat:     Mouth: Mucous membranes are moist.  Eyes:     Extraocular Movements: Extraocular movements intact.     Conjunctiva/sclera: Conjunctivae normal.     Pupils: Pupils are equal, round, and reactive to light.  Neck:     Musculoskeletal: Normal range of motion and neck supple.  Cardiovascular:     Rate and Rhythm: Normal rate. Rhythm irregular.     Heart sounds: Murmur present.  Pulmonary:     Effort: Pulmonary effort is normal.     Breath sounds: No wheezing, rhonchi or rales.  Chest:     Chest wall: No tenderness.  Abdominal:     General: Bowel sounds are normal. There is no distension.     Palpations: Abdomen is soft.     Tenderness: There is no abdominal tenderness. There is no right CVA tenderness, left CVA tenderness or guarding.  Musculoskeletal:     Right lower leg: Edema present.     Comments: Needs assistance for transfer. S/s L AKA. W/c for mobility.   Skin:    General: Skin is warm and dry.     Findings: Erythema present.     Comments: Arterial ulcers @ top and medial aspect of the right great toe. Open area @ medical aspect of the right 2nd toe. A small black spot @ lateral aspect of the right 5th toe. Absent DP pulse. Right forefoot redness and slightly warmth. Skin tear is well approximated right elbow.   Neurological:     General: No focal deficit present.     Mental Status: He is alert. Mental status is at baseline.     Cranial Nerves: No cranial nerve deficit.     Motor: No weakness.     Coordination: Coordination normal.     Gait: Gait abnormal.     Comments: Oriented to person and place.   Psychiatric:  Mood and  Affect: Mood normal.        Behavior: Behavior normal.        Thought Content: Thought content normal.     Labs reviewed: Recent Labs    10/11/18 0306  10/12/18 0312 10/13/18 0257 10/17/18 10/22/18 12/17/18  NA 134*  --  132* 134* 136* 132* 132*  K 4.6  --  4.9 4.3 4.7 4.5  --   CL 101  --  100 101 103  --   --   CO2 26  --  23 25 29   --   --   GLUCOSE 194*  --  243* 120*  --   --   --   BUN 26*  --  31* 26* 25* 20 38*  CREATININE 1.45*   < > 1.41* 1.30* 1.1 1.0 1.3  CALCIUM 7.9*  --  8.0* 7.7* 8.0  --   --    < > = values in this interval not displayed.   Recent Labs    08/29/18 0815 10/09/18 1352 10/09/18 1920 10/17/18 10/22/18  AST 15 23 22 22 16   ALT 13 22 21 14 11   ALKPHOS  --  64 68 67 69  BILITOT 0.7 0.8 0.7  --   --   PROT 6.0* 6.3* 6.2* 4.6  --   ALBUMIN  --  2.5* 2.5* 2.2  --    Recent Labs    04/01/18 1432  08/29/18 0815  10/11/18 1114 10/12/18 0312 10/13/18 0257 10/17/18 10/22/18 12/17/18  WBC 8.9   < > 11.7*   < > 16.8* 24.0* 16.6* 13.3 9.5  --   NEUTROABS 6.3  --  8,471*  --   --   --   --   --   --   --   HGB 12.7*   < > 12.0*   < > 10.8* 9.8* 8.7* 9.3* 11.4* 11.4*  HCT 38.4*   < > 34.5*   < > 32.4* 29.5* 26.9* 28* 33* 33*  MCV 104.9*   < > 99.7   < > 100.6* 99.3 101.1*  --   --   --   PLT 317   < > 455*   < > 420* 414* 421* 477* 483*  --    < > = values in this interval not displayed.   Lab Results  Component Value Date   TSH 1.69 10/24/2018   Lab Results  Component Value Date   HGBA1C 6.8 12/17/2018   Lab Results  Component Value Date   CHOL 126 08/29/2018   HDL 49 08/29/2018   LDLCALC 59 08/29/2018   TRIG 95 08/29/2018   CHOLHDL 2.6 08/29/2018    Significant Diagnostic Results in last 30 days:  No results found.  Assessment/Plan Benign hypertension with chronic kidney disease, stage III (HCC) Blood pressure is controlled, continue Losartan 100mg  qd.   Paroxysmal atrial fibrillation (HCC) Heart rate is in control, continue  Metoprolol 25mg  bid, Eliquis 2.5mg  bid.   PAD (peripheral artery disease) (HCC) Arterial ulcers @ top and medial aspect of the right great toe, open area @ medical aspect of the right 2nd toe, a small black spot @ lateral of the right 5th toe. Continue Doxy 100mg  qd after completion of bid for 6 weeks since the redness, warmth, and pain have improved some,  no chang of current treatment. Vascular surgeon prn.   Skin tear of elbow without complication, initial encounter Right elbow skin tear, well approximated, no s/s of infection. It  should heal.   Controlled type 2 diabetes mellitus with stage 3 chronic kidney disease, without long-term current use of insulin (HCC) Blood sugar is controlled, continue Metformin 500mg  bid.   Edema Trace edema RLE, continue Furosemide 20mg  qd.   Neuropathic pain Continue Gabapentin  Blood loss anemia Stable, last Hgb 11.4 12/17/18, continue Fe supplement daily.    Family/ staff Communication: plan of care reviewed with the patient and charge nurse.   Labs/tests ordered: none  Time spend 25 minutes.

## 2019-01-20 NOTE — Assessment & Plan Note (Signed)
Continue Gabapentin

## 2019-01-20 NOTE — Assessment & Plan Note (Signed)
Stable, last Hgb 11.4 12/17/18, continue Fe supplement daily.

## 2019-01-20 NOTE — Assessment & Plan Note (Signed)
Blood pressure is controlled, continue Losartan 100mg qd.  

## 2019-01-20 NOTE — Assessment & Plan Note (Signed)
Heart rate is in control, continue Metoprolol 25mg  bid, Eliquis 2.5mg  bid.

## 2019-01-20 NOTE — Assessment & Plan Note (Signed)
Right elbow skin tear, well approximated, no s/s of infection. It should heal.

## 2019-01-20 NOTE — Assessment & Plan Note (Addendum)
Arterial ulcers @ top and medial aspect of the right great toe, open area @ medical aspect of the right 2nd toe, a small black spot @ lateral of the right 5th toe. Continue Doxy 100mg  qd after completion of bid for 6 weeks since the redness, warmth, and pain have improved some,  no chang of current treatment. Vascular surgeon prn.

## 2019-01-20 NOTE — Assessment & Plan Note (Signed)
Blood sugar is controlled, continue Metformin 500mg  bid.

## 2019-01-23 LAB — BASIC METABOLIC PANEL
BUN: 23 — AB (ref 4–21)
Creatinine: 1.2 (ref 0.6–1.3)
Glucose: 109
Potassium: 4.8 (ref 3.4–5.3)
Sodium: 127 — AB (ref 137–147)

## 2019-01-24 ENCOUNTER — Non-Acute Institutional Stay (SKILLED_NURSING_FACILITY): Payer: Medicare Other | Admitting: Nurse Practitioner

## 2019-01-24 ENCOUNTER — Encounter: Payer: Self-pay | Admitting: Nurse Practitioner

## 2019-01-24 DIAGNOSIS — E1122 Type 2 diabetes mellitus with diabetic chronic kidney disease: Secondary | ICD-10-CM

## 2019-01-24 DIAGNOSIS — I739 Peripheral vascular disease, unspecified: Secondary | ICD-10-CM

## 2019-01-24 DIAGNOSIS — N183 Chronic kidney disease, stage 3 (moderate): Secondary | ICD-10-CM

## 2019-01-24 DIAGNOSIS — E871 Hypo-osmolality and hyponatremia: Secondary | ICD-10-CM | POA: Diagnosis not present

## 2019-01-24 NOTE — Assessment & Plan Note (Signed)
S/p L AKA, right foot arterial wounds are treated with Doxycycline 100mg  qd, wrapped in dressing.

## 2019-01-24 NOTE — Progress Notes (Signed)
Location:  [place] Room Number: 35 Place of Service:  SNF (31) Provider:  Marlana Latus  NP  Virgie Dad, MD  Patient Care Team: Virgie Dad, MD as PCP - General (Internal Medicine) Buford Dresser, MD as PCP - Cardiology (Cardiology) Ricard Dillon, MD as Consulting Physician (Internal Medicine)  Extended Emergency Contact Information Primary Emergency Contact: Gordon,[place] Address: [address]          [place], [place] [address] [name] of [place] Phone: [phone] Relation: Daughter  Code Status:  DNR Goals of care: Advanced Directive information Advanced Directives 01/24/2019  Does Patient Have a Medical Advance Directive? Yes  Type of Advance Directive Living will  Does patient want to make changes to medical advance directive? No - Patient declined  Copy of [place] in Chart? Yes - validated most recent copy scanned in chart (See row information)  Pre-existing out of facility DNR order (yellow form or pink MOST form) -     Chief Complaint  Patient presents with  . Acute Visit    C/o - low sodium    HPI:  Pt is a 85 y.o. male seen today for an acute visit for lower sodium, 01/23/19 N 127, K 4.8, bun 23, creat 1.17. trace edema RLE, right foot arterial wounds are wrapped in dressing, on Doxycycline 100mg  qd, on Furosemide 20mg  M, W, F. He appears dry mouth lips. He resides in [facility] for safety and care assistance, s/p L AKA due to PAD. T2DM, on Metformin 500mg  bid, last Hgb a1c 6.8 12/17/18   Past Medical History:  Diagnosis Date  . BPH (benign prostatic hyperplasia) 10/14/2014  . Cervicalgia 01/04/2016  . Chronic kidney disease   . Contusion of left shoulder 05/27/14  . DM type 2 (diabetes mellitus, type 2) ([place])   . Dysphagia   . High blood pressure   . History of basal cell cancer 11/21/2005   Right temple  . History of colonic polyps 10/14/2003  . Hyperlipidemia   . Left knee pain  05/27/14  . Peripheral vascular disease ([place])   . SAH (subarachnoid hemorrhage) ([place]) 2009   TBI  . Stroke ([place])   . TIA (transient ischemic attack) 11/06/2005   Left eye pain. Slurred speech. Diaphoresis. No residual effects.    Past Surgical History:  Procedure Laterality Date  . ABDOMINAL AORTOGRAM W/LOWER EXTREMITY N/A 06/12/2018   Procedure: ABDOMINAL AORTOGRAM W/LOWER EXTREMITY;  Surgeon: Marty Heck, MD;  Location: [place] CV LAB;  Service: Cardiovascular;  Laterality: N/A;  . Achilles tendon repair Left 1998   ruptured tendon  . AMPUTATION Left 10/11/2018   Procedure: Left  AMPUTATION ABOVE KNEE;  Surgeon: Marty Heck, MD;  Location: [place];  Service: Vascular;  Laterality: Left;  . COLONOSCOPY  1992   Polyp removed  . COLONOSCOPY  1998   normal  . ORIF FEMUR FRACTURE Right 1929  . PERIPHERAL VASCULAR INTERVENTION Left 06/12/2018   Procedure: PERIPHERAL VASCULAR INTERVENTION;  Surgeon: Marty Heck, MD;  Location: [place] CV [facility];  Service: Cardiovascular;  Laterality: Left;  Attempted   . TONSILLECTOMY      Allergies  Allergen Reactions  . Tape Other (See Comments)    TAPE WILL TEAR THE SKIN!!!!    Outpatient Encounter Medications as of 01/24/2019  Medication Sig  . acetaminophen (TYLENOL 8 HOUR) 650 MG CR tablet Take 1 tablet (650 mg total) by mouth every 8 (eight) hours as needed  for pain.  Marland Kitchen apixaban (ELIQUIS) 2.5 MG TABS tablet Take 2.5 mg by mouth 2 (two) times daily.  . Calcium Carb-Cholecalciferol (CALCIUM 600+D3 PO) Take 1 tablet by mouth daily.  . Cholecalciferol 1000 UNITS tablet Take 1,000 Units by mouth daily.  Marland Kitchen doxycycline (VIBRA-TABS) 100 MG tablet Take 100 mg by mouth daily.   . ferrous sulfate 325 (65 FE) MG tablet Take 325 mg by mouth daily with breakfast. Take 1 tablet on Monday, Wednesday and Friday.  . furosemide (LASIX) 20 MG tablet Take 20 mg by mouth daily. Mon,Wed,Fri 9am  . gabapentin (NEURONTIN) 100 MG capsule  TAKE ONE CAPSULE BY MOUTH EVERY NIGHT AT BEDTIME  . losartan (COZAAR) 25 MG tablet Take 25 mg by mouth daily.  . metFORMIN (GLUCOPHAGE) 500 MG tablet Take 1 tablet (500 mg total) by mouth 2 (two) times daily with a meal.  . metoprolol tartrate (LOPRESSOR) 25 MG tablet Take 1 tablet (25 mg total) by mouth 2 (two) times daily.  . Nutritional Supplements (BOOST GLUCOSE CONTROL) LIQD Take 237 mLs by mouth 2 (two) times a day. Twice a day between meals  . potassium chloride (K-DUR) 10 MEQ tablet Take 10 mEq by mouth daily. Mon.Wed,Fri 9am  . Propylene Glycol-Glycerin 1-0.3 % SOLN Place 2 drops into both eyes as needed (dry eyes).   . saccharomyces boulardii (FLORASTOR) 250 MG capsule Take 250 mg by mouth 2 (two) times daily.  . simvastatin (ZOCOR) 10 MG tablet TAKE ONE TABLET BY MOUTH DAILY  . zinc oxide 20 % ointment Apply 1 application topically as needed for irritation. Apply to buttocks after every incontinent episode and as needed for redness   No facility-administered encounter medications on file as of 01/24/2019.    ROS was provided with assistance of stff Review of Systems  Constitutional: Negative for activity change, appetite change, chills, diaphoresis, fatigue and fever.  HENT: Positive for hearing loss. Negative for congestion and voice change.   Respiratory: Negative for cough, shortness of breath and wheezing.   Cardiovascular: Positive for leg swelling. Negative for chest pain and palpitations.  Gastrointestinal: Negative for abdominal distention, constipation, diarrhea, nausea and vomiting.  Genitourinary: Negative for difficulty urinating, dysuria and urgency.  Musculoskeletal: Positive for gait problem.  Skin: Positive for color change and wound.  Neurological: Negative for dizziness, speech difficulty, weakness and headaches.       Memory lapses.   Psychiatric/Behavioral: Negative for agitation, behavioral problems, hallucinations and sleep disturbance. The patient is not  nervous/anxious.     Immunization History  Administered Date(s) Administered  . DTaP 12/31/2012  . Influenza, High Dose Seasonal PF 04/04/2017  . Influenza,inj,Quad PF,6+ Mos 03/28/2018  . Influenza-Unspecified 04/10/2014, 03/25/2015, 04/06/2016  . PPD Test 04/29/2014  . Pneumococcal Conjugate-13 04/28/2017  . Pneumococcal-Unspecified 03/13/2011  . Td 12/24/2012  . Tdap 04/28/2017  . Zoster Recombinat (Shingrix) 12/14/2017   Pertinent  Health Maintenance Due  Topic Date Due  . OPHTHALMOLOGY EXAM  07/07/2017  . FOOT EXAM  06/27/2018  . INFLUENZA VACCINE  01/25/2019  . HEMOGLOBIN A1C  06/18/2019  . PNA vac Low Risk Adult  Completed   Fall Risk  10/02/2018 09/11/2018 08/28/2018 08/08/2018 08/07/2018  Falls in the past year? 0 0 0 0 0  Number falls in past yr: 0 0 0 - 0  Injury with Fall? 0 0 0 - 0  Risk for fall due to : - - - - -   Functional Status Survey:    Vitals:   01/24/19 1054  BP: 106/70  Pulse: 79  Resp: 18  Temp: 97.7 F (36.5 C)  SpO2: 94%  Weight: 125 lb 9.6 oz (57 kg)   Body mass index is 19.1 kg/m. Physical Exam Constitutional:      General: He is not in acute distress.    Appearance: Normal appearance. He is not ill-appearing, toxic-appearing or diaphoretic.  HENT:     Head: Normocephalic.     Nose: Nose normal.     Mouth/Throat:     Mouth: Mucous membranes are dry.  Eyes:     Extraocular Movements: Extraocular movements intact.     Conjunctiva/sclera: Conjunctivae normal.     Pupils: Pupils are equal, round, and reactive to light.  Neck:     Musculoskeletal: Normal range of motion and neck supple.  Cardiovascular:     Rate and Rhythm: Normal rate. Rhythm irregular.     Heart sounds: Murmur present.  Pulmonary:     Breath sounds: No wheezing, rhonchi or rales.  Chest:     Chest wall: No tenderness.  Abdominal:     General: Bowel sounds are normal.     Palpations: Abdomen is soft.     Tenderness: There is no abdominal tenderness. There is no  right CVA tenderness, guarding or rebound.  Musculoskeletal:     Right lower leg: Edema present.     Comments: Trace edema RLE. S/p L AKA  Skin:    General: Skin is warm and dry.     Comments: Right foot arterial wounds are wrapped in dressing  Neurological:     General: No focal deficit present.     Mental Status: He is alert. Mental status is at baseline.     Comments: Oriented to person and place.   Psychiatric:        Mood and Affect: Mood normal.        Behavior: Behavior normal.        Thought Content: Thought content normal.        Judgment: Judgment normal.     Labs reviewed: Recent Labs    10/11/18 0306  10/12/18 0312 10/13/18 0257 10/17/18 10/22/18 12/17/18  NA 134*  --  132* 134* 136* 132* 132*  K 4.6  --  4.9 4.3 4.7 4.5  --   CL 101  --  100 101 103  --   --   CO2 26  --  23 25 29   --   --   GLUCOSE 194*  --  243* 120*  --   --   --   BUN 26*  --  31* 26* 25* 20 38*  CREATININE 1.45*   < > 1.41* 1.30* 1.1 1.0 1.3  CALCIUM 7.9*  --  8.0* 7.7* 8.0  --   --    < > = values in this interval not displayed.   Recent Labs    08/29/18 0815 10/09/18 1352 10/09/18 1920 10/17/18 10/22/18  AST 15 23 22 22 16   ALT 13 22 21 14 11   ALKPHOS  --  64 68 67 69  BILITOT 0.7 0.8 0.7  --   --   PROT 6.0* 6.3* 6.2* 4.6  --   ALBUMIN  --  2.5* 2.5* 2.2  --    Recent Labs    04/01/18 1432  08/29/18 0815  10/11/18 1114 10/12/18 0312 10/13/18 0257 10/17/18 10/22/18 12/17/18  WBC 8.9   < > 11.7*   < > 16.8* 24.0* 16.6* 13.3 9.5  --   NEUTROABS  6.3  --  8,471*  --   --   --   --   --   --   --   HGB 12.7*   < > 12.0*   < > 10.8* 9.8* 8.7* 9.3* 11.4* 11.4*  HCT 38.4*   < > 34.5*   < > 32.4* 29.5* 26.9* 28* 33* 33*  MCV 104.9*   < > 99.7   < > 100.6* 99.3 101.1*  --   --   --   PLT 317   < > 455*   < > 420* 414* 421* 477* 483*  --    < > = values in this interval not displayed.   Lab Results  Component Value Date   TSH 1.69 10/24/2018   Lab Results  Component Value  Date   HGBA1C 6.8 12/17/2018   Lab Results  Component Value Date   CHOL 126 08/29/2018   HDL 49 08/29/2018   LDLCALC 59 08/29/2018   TRIG 95 08/29/2018   CHOLHDL 2.6 08/29/2018    Significant Diagnostic Results in last 30 days:  No results found.  Assessment/Plan Hyponatremia Worsened, serum Na 127 01/23/19, will hold Furosemide next Monday/Wednesday along with Kcl, encourage oral hydration, repeat BMP 01/30/19  PAD (peripheral artery disease) (HCC) S/p L AKA, right foot arterial wounds are treated with Doxycycline 100mg  qd, wrapped in dressing.   Controlled type 2 diabetes mellitus with stage 3 chronic kidney disease, without long-term current use of insulin (HCC) Controlled blood sugar, continue Metformin 500mg  bid.      Family/ staff Communication: plan of care reviewed with the patient and charge nurse.   Labs/tests ordered:  BMP 01/30/19  Time spend 25 minutes

## 2019-01-24 NOTE — Assessment & Plan Note (Signed)
Worsened, serum Na 127 01/23/19, will hold Furosemide next Monday/Wednesday along with Kcl, encourage oral hydration, repeat BMP 01/30/19

## 2019-01-24 NOTE — Assessment & Plan Note (Signed)
Controlled blood sugar, continue Metformin 500mg  bid.

## 2019-01-30 ENCOUNTER — Encounter: Payer: Self-pay | Admitting: Internal Medicine

## 2019-01-30 ENCOUNTER — Non-Acute Institutional Stay (SKILLED_NURSING_FACILITY): Payer: Medicare Other | Admitting: Internal Medicine

## 2019-01-30 DIAGNOSIS — E871 Hypo-osmolality and hyponatremia: Secondary | ICD-10-CM | POA: Diagnosis not present

## 2019-01-30 DIAGNOSIS — I739 Peripheral vascular disease, unspecified: Secondary | ICD-10-CM | POA: Diagnosis not present

## 2019-01-30 DIAGNOSIS — T148XXA Other injury of unspecified body region, initial encounter: Secondary | ICD-10-CM | POA: Diagnosis not present

## 2019-01-30 DIAGNOSIS — L089 Local infection of the skin and subcutaneous tissue, unspecified: Secondary | ICD-10-CM | POA: Diagnosis not present

## 2019-01-30 LAB — BASIC METABOLIC PANEL WITH GFR
BUN: 24 — AB (ref 4–21)
Creatinine: 1.2 (ref 0.6–1.3)
Glucose: 110
Potassium: 4.6 (ref 3.4–5.3)
Sodium: 130 — AB (ref 137–147)

## 2019-01-30 NOTE — Progress Notes (Signed)
Location:  [place] Room Number: 35 Place of Service:  SNF (31) Provider:Gifford Ballon L,MD   Virgie Dad, MD  Patient Care Team: Virgie Dad, MD as PCP - General (Internal Medicine) Buford Dresser, MD as PCP - Cardiology (Cardiology) Ricard Dillon, MD as Consulting Physician (Internal Medicine)  Extended Emergency Contact Information Primary Emergency Contact: Nathan Cook,Nathan Cook Address: [address]          [place], [place] [address] [place] Phone: [phone] Relation: Daughter  Code Status:DNR Goals of care: Advanced Directive information Advanced Directives 01/30/2019  Does Patient Have a Medical Advance Directive? Yes  Type of Paramedic of [place];Living will  Does patient want to make changes to medical advance directive? No - Patient declined  Copy of [place] in Chart? Yes - validated most recent copy scanned in chart (See row information)  Pre-existing out of facility DNR order (yellow form or pink MOST form) -     Chief Complaint  Patient presents with  . Acute Visit    Wound on right foot     HPI:  Nathan Cook is a 85 y.o. male seen today for an acute visit for worsening wound on the right foot Patient has h/o Hypertension, Hyperlipidemia, Diabetes mellitus, h/o Brain stem Infarct s/p TPA In 10/19, Atrial Fibrillation on Eliquis since 10/19.,PADs/p Left AKA   Patient has a history of chronic arterial wound on his right great toe.  He has seen vascular surgery and they have said it is unreconstructable. They think he would eventually need AKA.  Patient has refused to follow-up with them.  He also did  not want to see wound care .  But today the nurses said that his toe is worsened.  He has maceration off most of his toes.  Redness going up to the foot.  Discharge have some odor And he has more pain now.. Past Medical History:  Diagnosis Date  . BPH (benign  prostatic hyperplasia) 10/14/2014  . Cervicalgia 01/04/2016  . Chronic kidney disease   . Contusion of left shoulder 05/27/14  . DM type 2 (diabetes mellitus, type 2) ([place])   . Dysphagia   . High blood pressure   . History of basal cell cancer 11/21/2005   Right temple  . History of colonic polyps 10/14/2003  . Hyperlipidemia   . Left knee pain 05/27/14  . Peripheral vascular disease ([place])   . SAH (subarachnoid hemorrhage) ([place]) 2009   TBI  . Stroke ([place])   . TIA (transient ischemic attack) 11/06/2005   Left eye pain. Slurred speech. Diaphoresis. No residual effects.    Past Surgical History:  Procedure Laterality Date  . ABDOMINAL AORTOGRAM W/LOWER EXTREMITY N/A 06/12/2018   Procedure: ABDOMINAL AORTOGRAM W/LOWER EXTREMITY;  Surgeon: Marty Heck, MD;  Location: [place] [facility];  Service: Cardiovascular;  Laterality: N/A;  . Achilles tendon repair Left 1998   ruptured tendon  . AMPUTATION Left 10/11/2018   Procedure: Left  AMPUTATION ABOVE KNEE;  Surgeon: Marty Heck, MD;  Location: [place];  Service: Vascular;  Laterality: Left;  . COLONOSCOPY  1992   Polyp removed  . COLONOSCOPY  1998   normal  . ORIF FEMUR FRACTURE Right 1929  . PERIPHERAL VASCULAR INTERVENTION Left 06/12/2018   Procedure: PERIPHERAL VASCULAR INTERVENTION;  Surgeon: Marty Heck, MD;  Location: [place] [facility];  Service: Cardiovascular;  Laterality: Left;  Attempted   . TONSILLECTOMY  Allergies  Allergen Reactions  . Tape Other (See Comments)    TAPE WILL TEAR THE SKIN!!!!    Outpatient Encounter Medications as of 01/30/2019  Medication Sig  . acetaminophen (TYLENOL 8 HOUR) 650 MG CR tablet Take 1 tablet (650 mg total) by mouth every 8 (eight) hours as needed for pain.  Marland Kitchen apixaban (ELIQUIS) 2.5 MG TABS tablet Take 2.5 mg by mouth 2 (two) times daily.  . Calcium Carb-Cholecalciferol (CALCIUM 600+D3 PO) Take 1 tablet by mouth daily.  . Cholecalciferol 1000 UNITS tablet Take  1,000 Units by mouth daily.  Marland Kitchen doxycycline (VIBRA-TABS) 100 MG tablet Take 100 mg by mouth daily.   . ferrous sulfate 325 (65 FE) MG tablet Take 325 mg by mouth daily with breakfast. Take 1 tablet on Monday, Wednesday and Friday.  . furosemide (LASIX) 20 MG tablet Take 20 mg by mouth daily. Mon,Wed,Fri 9am  . gabapentin (NEURONTIN) 100 MG capsule TAKE ONE CAPSULE BY MOUTH EVERY NIGHT AT BEDTIME  . losartan (COZAAR) 25 MG tablet Take 25 mg by mouth daily.  . metFORMIN (GLUCOPHAGE) 500 MG tablet Take 1 tablet (500 mg total) by mouth 2 (two) times daily with a meal.  . metoprolol tartrate (LOPRESSOR) 25 MG tablet Take 1 tablet (25 mg total) by mouth 2 (two) times daily.  . Nutritional Supplements (BOOST GLUCOSE CONTROL) LIQD Take 237 mLs by mouth 2 (two) times a day. Twice a day between meals  . potassium chloride (K-DUR) 10 MEQ tablet Take 10 mEq by mouth daily. Mon.Wed,Fri 9am  . Propylene Glycol-Glycerin 1-0.3 % SOLN Place 2 drops into both eyes as needed (dry eyes).   . saccharomyces boulardii (FLORASTOR) 250 MG capsule Take 250 mg by mouth 2 (two) times daily.  . simvastatin (ZOCOR) 10 MG tablet TAKE ONE TABLET BY MOUTH DAILY  . zinc oxide 20 % ointment Apply 1 application topically as needed for irritation. Apply to buttocks after every incontinent episode and as needed for redness   No facility-administered encounter medications on file as of 01/30/2019.     Review of Systems  Constitutional: Negative.   HENT: Negative.   Respiratory: Negative.   Cardiovascular: Negative.   Gastrointestinal: Negative.   Genitourinary: Negative.   Musculoskeletal: Negative.   Skin: Positive for wound.  Neurological: Negative.   Psychiatric/Behavioral: Negative.     Immunization History  Administered Date(s) Administered  . DTaP 12/31/2012  . Influenza, High Dose Seasonal PF 04/04/2017  . Influenza,inj,Quad PF,6+ Mos 03/28/2018  . Influenza-Unspecified 04/10/2014, 03/25/2015, 04/06/2016  . PPD  Test 04/29/2014  . Pneumococcal Conjugate-13 04/28/2017  . Pneumococcal-Unspecified 03/13/2011  . Td 12/24/2012  . Tdap 04/28/2017  . Zoster Recombinat (Shingrix) 12/14/2017   Pertinent  Health Maintenance Due  Topic Date Due  . OPHTHALMOLOGY EXAM  07/07/2017  . FOOT EXAM  06/27/2018  . INFLUENZA VACCINE  01/25/2019  . HEMOGLOBIN A1C  06/18/2019  . PNA vac Low Risk Adult  Completed   Fall Risk  10/02/2018 09/11/2018 08/28/2018 08/08/2018 08/07/2018  Falls in the past year? 0 0 0 0 0  Number falls in past yr: 0 0 0 - 0  Injury with Fall? 0 0 0 - 0  Risk for fall due to : - - - - -   Functional Status Survey:    There were no vitals filed for this visit. There is no height or weight on file to calculate BMI. Physical Exam Vitals signs reviewed.  Constitutional:      Appearance: Normal appearance.  HENT:  Head: Normocephalic.     Nose: Nose normal.     Mouth/Throat:     Mouth: Mucous membranes are moist.     Pharynx: Oropharynx is clear.  Eyes:     Pupils: Pupils are equal, round, and reactive to light.  Neck:     Musculoskeletal: Neck supple.  Cardiovascular:     Rate and Rhythm: Normal rate and regular rhythm.     Pulses: Normal pulses.  Pulmonary:     Effort: Pulmonary effort is normal.     Breath sounds: Normal breath sounds.  Abdominal:     General: Abdomen is flat. Bowel sounds are normal.     Palpations: Abdomen is soft.  Musculoskeletal:     Comments: Has Macerated Right Toes with Redness going up the feet Cold foot Painful and has Odor  Neurological:     General: No focal deficit present.     Mental Status: He is alert and oriented to person, place, and time.  Psychiatric:        Mood and Affect: Mood normal.        Thought Content: Thought content normal.     Labs reviewed: Recent Labs    10/11/18 0306  10/12/18 0312 10/13/18 0257  10/17/18 10/22/18 12/17/18 01/23/19  NA 134*  --  132* 134*   < > 136* 132* 132* 127*  K 4.6  --  4.9 4.3  --  4.7  4.5  --  4.8  CL 101  --  100 101  --  103  --   --   --   CO2 26  --  23 25  --  29  --   --   --   GLUCOSE 194*  --  243* 120*  --   --   --   --   --   BUN 26*  --  31* 26*   < > 25* 20 38* 23*  CREATININE 1.45*   < > 1.41* 1.30*   < > 1.1 1.0 1.3 1.2  CALCIUM 7.9*  --  8.0* 7.7*  --  8.0  --   --   --    < > = values in this interval not displayed.   Recent Labs    08/29/18 0815 10/09/18 1352 10/09/18 1920 10/17/18 10/22/18  AST 15 23 22 22 16   ALT 13 22 21 14 11   ALKPHOS  --  64 68 67 69  BILITOT 0.7 0.8 0.7  --   --   PROT 6.0* 6.3* 6.2* 4.6  --   ALBUMIN  --  2.5* 2.5* 2.2  --    Recent Labs    04/01/18 1432  08/29/18 0815  10/11/18 1114 10/12/18 0312 10/13/18 0257 10/17/18 10/22/18 12/17/18  WBC 8.9   < > 11.7*   < > 16.8* 24.0* 16.6* 13.3 9.5  --   NEUTROABS 6.3  --  8,471*  --   --   --   --   --   --   --   HGB 12.7*   < > 12.0*   < > 10.8* 9.8* 8.7* 9.3* 11.4* 11.4*  HCT 38.4*   < > 34.5*   < > 32.4* 29.5* 26.9* 28* 33* 33*  MCV 104.9*   < > 99.7   < > 100.6* 99.3 101.1*  --   --   --   PLT 317   < > 455*   < > 420* 414* 421* 477* 483*  --    < > =  values in this interval not displayed.   Lab Results  Component Value Date   TSH 1.69 10/24/2018   Lab Results  Component Value Date   HGBA1C 6.8 12/17/2018   Lab Results  Component Value Date   CHOL 126 08/29/2018   HDL 49 08/29/2018   LDLCALC 59 08/29/2018   TRIG 95 08/29/2018   CHOLHDL 2.6 08/29/2018    Significant Diagnostic Results in last 30 days:  No results found.  Assessment/Plan  Right Foot Infected wound Worsening Will discontinue Doxycyline Start on Augmentin for 10 days Urgent referral to Vascular  Patient had refused to see vascular before but is now more open to the idea of AKA He does want to see Dr. Dellia Nims at wound care for another opinion And just wanted that patient can get septic with this wound Hyponatremia Lasix is on hold.  Sodium today was 131 from 127 He does not have  any edema will continue to hold Lasix   Family/ staff Communication:   Labs/tests ordered:   Total time spent in this patient care encounter was  25_  minutes; greater than 50% of the visit spent counseling patient and staff, reviewing records , Labs and coordinating care for problems addressed at this encounter.

## 2019-02-04 ENCOUNTER — Other Ambulatory Visit: Payer: Self-pay | Admitting: *Deleted

## 2019-02-04 ENCOUNTER — Encounter: Payer: Self-pay | Admitting: *Deleted

## 2019-02-05 ENCOUNTER — Telehealth: Payer: Self-pay | Admitting: *Deleted

## 2019-02-05 NOTE — Telephone Encounter (Signed)
Call to [facility] and spoke with [facility] to give time arrival change for 02/07/2019 surgery. Instructed to be at [facility] admitting at 7:15 am. Repeated back information.  All other instructions unchanged.

## 2019-02-05 NOTE — Telephone Encounter (Signed)
Call to [facility] again after booking decided best to leave patient at 9 am arrival at [facility]. I spoke with BANYELE to clarify the 9 am arrival time for 02/07/2019 surgery.

## 2019-02-06 ENCOUNTER — Encounter (HOSPITAL_COMMUNITY): Payer: Self-pay

## 2019-02-06 ENCOUNTER — Other Ambulatory Visit: Payer: Self-pay

## 2019-02-06 ENCOUNTER — Ambulatory Visit: Payer: Medicare Other | Admitting: Adult Health

## 2019-02-06 NOTE — Progress Notes (Addendum)
Pre-op phone call completed with Karel Jarvis, RN, nursing supervisor at [facility]. Instructions given to have patient NPO by midnight tonight. Patient to arrive at [facility] Entrance A at 1200 PM. Medicines to be taken day of surgery: metoprolol. If needed tylenol and eye drops may be taken/used. Instructed not to have patient take metFomin the day of surgery as well as to check CBG every two hours the day of surgery until arrival to Short Stay. If CBG less than 70, patient is to drink 1/2 cup of clear juice apple or cranberry juice, re-check CBG in 15 minutes, if still lower than 70, call Short Stay department. Visitation policy discussed. Verbalization of pre-op instructions and understanding were made and questions answered. Short Stay number if there are further questions.  Pre-op instructions faxed to  [phone]  PCP - Dr. Veleta Miners Cardiologist - Dr. Octavio Graves  Chest x-ray - denies EKG - 10/09/2018 Stress Test - denies ECHO - 04/02/18 Cardiac Cath - denies  Sleep Study - denies CPAP - N/A  Fasting Blood Sugar - 112 - 135 Checks Blood Sugar 1/week on Monday  Blood Thinner Instructions: E[place]is: LD 02/04/2019 Aspirin Instructions: N/A  Anesthesia review: YES, cardiac hx, stroke hx  Coronavirus Screening  Have you experienced the following symptoms:  Cough yes/no: No Fever (>100.32F)  yes/no: No Runny nose yes/no: No Sore throat yes/no: No Difficulty breathing/shortness of breath  yes/no: No

## 2019-02-06 NOTE — Progress Notes (Signed)
[place], [place] [address] [place] [address] Phone: [phone] Fax: [phone]    Your procedure is scheduled on Monday, August 14th.  Report to [facility] Entrance "A" at 12:00 P.M., and check in at the Admitting office.  Call this number if you have problems the morning of surgery:  [phone]  Call [phone] if you have any questions prior to your surgery date Monday-Friday 8am-4pm   Remember:  Do not eat or drink after midnight the night before your surgery    Take these medicines the morning of surgery with A SIP OF WATER  metoprolol tartrate (LOPRESSOR)  If needed - acetaminophen (TYLENOL 8 HOUR) , Propylene Glycol-Glycerin 1-0.3 % SOLN- eye drops  As of today, STOP taking any Aspirin (unless otherwise instructed by your surgeon), Aleve, Naproxen, Ibuprofen, Motrin, Advil, Goody's, BC's, all herbal medications, fish oil, and all vitamins.   How to Manage Your Diabetes Before and After Surgery  How do I manage my blood sugar before surgery? . Check your blood sugar at least 4 times a day, starting 2 days before surgery, to make sure that the level is not too high or low. o Check your blood sugar the morning of your surgery when you wake up and every 2 hours until you get to the Short Stay unit. . If your blood sugar is less than 70 mg/dL, you will need to treat for low blood sugar: o Do not take insulin. o Treat a low blood sugar (less than 70 mg/dL) with  cup of clear juice (cranberry or apple), 4 glucose tablets, OR glucose gel. Recheck blood sugar in 15 minutes after treatment (to make sure it is greater than 70 mg/dL). If your blood sugar is not greater than 70 mg/dL on recheck, call [phone] o  for further instructions. . Report your blood sugar to the short stay nurse when you get to Short Stay. . If you are admitted to the hospital after surgery: o Your blood sugar will be checked  by the staff and you will probably be given insulin after surgery (instead of oral diabetes medicines) to make sure you have good blood sugar levels. o The goal for blood sugar control after surgery is 80-180 mg/dL.  WHAT DO I DO ABOUT MY DIABETES MEDICATION?  Marland Kitchen Do not take metFORMIN (GLUCOPHAGE)/oral diabetes medicines (pills) the morning of surgery.  Reviewed and Endorsed by [facility] Patient Education Committee, August 2015  The Morning of Surgery  Do not wear jewelry, make-up or nail polish.  Do not wear lotions, powders, or perfumes/colognes, or deodorant  Do not shave 48 hours prior to surgery.  Men may shave face and neck.  Do not bring valuables to the hospital.  [facility] is not responsible for any belongings or valuables.  If you are a smoker, DO NOT Smoke 24 hours prior to surgery IF you wear a CPAP at night please bring your mask, tubing, and machine the morning of surgery   Remember that you must have someone to transport you home after your surgery, and remain with you for 24 hours if you are discharged the same day.  Contacts, glasses, hearing aids, dentures or bridgework may not be worn into surgery.   Leave your suitcase in the car.  After surgery it may be brought to your room.  For patients admitted to the hospital, discharge time will be determined by your treatment team.  Patients discharged  the day of surgery will not be allowed to drive home.   Special instructions:   [place]- Preparing For Surgery   Oral Hygiene is also important to reduce your risk of infection.  Remember - BRUSH YOUR TEETH THE MORNING OF SURGERY WITH YOUR REGULAR TOOTHPASTE  Day of Surgery:  Do not apply any deodorants/lotions. Please shower the morning of surgery with the CHG soap  Please wear clean clothes to the hospital/surgery center.   Remember to brush your teeth WITH YOUR REGULAR TOOTHPASTE.  Please read over the following fact sheets that you were given.

## 2019-02-06 NOTE — Anesthesia Preprocedure Evaluation (Addendum)
Anesthesia Evaluation  Patient identified by MRN, date of birth, ID band Patient awake    Reviewed: Allergy & Precautions, NPO status , Patient's Chart, lab work & pertinent test results  Airway Mallampati: II  TM Distance: >3 FB Neck ROM: Full    Dental no notable dental hx.    Pulmonary neg pulmonary ROS, former smoker,    Pulmonary exam normal breath sounds clear to auscultation       Cardiovascular hypertension, Pt. on medications + Peripheral Vascular Disease  Normal cardiovascular exam+ dysrhythmias Atrial Fibrillation + Valvular Problems/Murmurs AS  Rhythm:Regular Rate:Normal     Neuro/Psych TIACVA negative psych ROS   GI/Hepatic negative GI ROS, Neg liver ROS,   Endo/Other  diabetes, Type 2  Renal/GU negative Renal ROS  negative genitourinary   Musculoskeletal negative musculoskeletal ROS (+)   Abdominal   Peds negative pediatric ROS (+)  Hematology negative hematology ROS (+)   Anesthesia Other Findings   Reproductive/Obstetrics negative OB ROS                                                             Anesthesia Evaluation  Patient identified by MRN, date of birth, ID band Patient awake    Reviewed: Allergy & Precautions  Airway Mallampati: II  TM Distance: >3 FB     Dental   Pulmonary neg pulmonary ROS, former smoker,    breath sounds clear to auscultation       Cardiovascular hypertension, + Peripheral Vascular Disease   Rhythm:Regular Rate:Normal     Neuro/Psych    GI/Hepatic negative GI ROS, Neg liver ROS,   Endo/Other  diabetes  Renal/GU Renal disease     Musculoskeletal   Abdominal   Peds  Hematology   Anesthesia Other Findings   Reproductive/Obstetrics                           Anesthesia Physical Anesthesia Plan  ASA: III  Anesthesia Plan: General   Post-op Pain Management:    Induction:  Intravenous  PONV Risk Score and Plan: 2 and Ondansetron, Dexamethasone and Midazolam  Airway Management Planned: Oral ETT  Additional Equipment:   Intra-op Plan:   Post-operative Plan: Possible Post-op intubation/ventilation  Informed Consent:     Dental advisory given  Plan Discussed with: Anesthesiologist and CRNA  Anesthesia Plan Comments:        Anesthesia Quick Evaluation  Anesthesia Physical Anesthesia Plan  ASA: III  Anesthesia Plan: General   Post-op Pain Management:    Induction: Intravenous  PONV Risk Score and Plan: 2 and Ondansetron and Treatment may vary due to age or medical condition  Airway Management Planned: Oral ETT  Additional Equipment:   Intra-op Plan:   Post-operative Plan: Extubation in OR  Informed Consent: I have reviewed the patients History and Physical, chart, labs and discussed the procedure including the risks, benefits and alternatives for the proposed anesthesia with the patient or authorized representative who has indicated his/her understanding and acceptance.     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 He had newly diagnosed atrial fibrillation during that hospitalization and is now on Eliquis followed by cardiology (last seen by  Dr. Harrell Gave 01/10/19).  Severe PVD with critical limb ischemia. 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  TTE 04/02/18: - Left ventricle: The cavity size was normal. Wall thickness was   normal. Systolic function was normal. The estimated ejection   fraction was in the range of 60% to 65%. Wall motion was normal;   there were no regional wall motion abnormalities. Doppler   parameters are consistent with abnormal left ventricular   relaxation (grade 1 diastolic dysfunction). - Aortic valve: Valve mobility was restricted. There was moderate   stenosis. There was mild regurgitation.  Valve area (VTI): 1.04   cm^2. Valve area (Vmax): 0.82 cm^2. Valve area (Vmean): 0.99   cm^2.)       Anesthesia Quick Evaluation

## 2019-02-07 ENCOUNTER — Inpatient Hospital Stay (HOSPITAL_COMMUNITY): Payer: Medicare Other | Admitting: Physician Assistant

## 2019-02-07 ENCOUNTER — Encounter (HOSPITAL_COMMUNITY)
Admission: RE | Disposition: A | Discharge status: Skilled Nursing Facility | Payer: Self-pay | Source: Skilled Nursing Facility | Attending: Internal Medicine

## 2019-02-07 ENCOUNTER — Inpatient Hospital Stay (HOSPITAL_COMMUNITY)
Admission: RE | Admit: 2019-02-07 | Discharge: 2019-02-10 | DRG: 240 | Disposition: A | Discharge status: Skilled Nursing Facility | Payer: Medicare Other | Source: Skilled Nursing Facility | Attending: Internal Medicine | Admitting: Internal Medicine

## 2019-02-07 ENCOUNTER — Other Ambulatory Visit: Payer: Self-pay

## 2019-02-07 ENCOUNTER — Encounter (HOSPITAL_COMMUNITY): Payer: Self-pay

## 2019-02-07 DIAGNOSIS — N183 Chronic kidney disease, stage 3 unspecified: Secondary | ICD-10-CM | POA: Diagnosis present

## 2019-02-07 DIAGNOSIS — E871 Hypo-osmolality and hyponatremia: Secondary | ICD-10-CM | POA: Diagnosis present

## 2019-02-07 DIAGNOSIS — Z79899 Other long term (current) drug therapy: Secondary | ICD-10-CM

## 2019-02-07 DIAGNOSIS — Z20828 Contact with and (suspected) exposure to other viral communicable diseases: Secondary | ICD-10-CM | POA: Diagnosis present

## 2019-02-07 DIAGNOSIS — N401 Enlarged prostate with lower urinary tract symptoms: Secondary | ICD-10-CM | POA: Diagnosis present

## 2019-02-07 DIAGNOSIS — Z7901 Long term (current) use of anticoagulants: Secondary | ICD-10-CM | POA: Diagnosis not present

## 2019-02-07 DIAGNOSIS — Z85828 Personal history of other malignant neoplasm of skin: Secondary | ICD-10-CM

## 2019-02-07 DIAGNOSIS — I70229 Atherosclerosis of native arteries of extremities with rest pain, unspecified extremity: Secondary | ICD-10-CM | POA: Diagnosis present

## 2019-02-07 DIAGNOSIS — I6389 Other cerebral infarction: Secondary | ICD-10-CM | POA: Diagnosis not present

## 2019-02-07 DIAGNOSIS — I739 Peripheral vascular disease, unspecified: Secondary | ICD-10-CM | POA: Diagnosis present

## 2019-02-07 DIAGNOSIS — L89151 Pressure ulcer of sacral region, stage 1: Secondary | ICD-10-CM | POA: Diagnosis present

## 2019-02-07 DIAGNOSIS — I998 Other disorder of circulatory system: Secondary | ICD-10-CM | POA: Diagnosis present

## 2019-02-07 DIAGNOSIS — Z87891 Personal history of nicotine dependence: Secondary | ICD-10-CM

## 2019-02-07 DIAGNOSIS — Z7984 Long term (current) use of oral hypoglycemic drugs: Secondary | ICD-10-CM | POA: Diagnosis not present

## 2019-02-07 DIAGNOSIS — M159 Polyosteoarthritis, unspecified: Secondary | ICD-10-CM | POA: Diagnosis present

## 2019-02-07 DIAGNOSIS — Z8673 Personal history of transient ischemic attack (TIA), and cerebral infarction without residual deficits: Secondary | ICD-10-CM | POA: Diagnosis not present

## 2019-02-07 DIAGNOSIS — E785 Hyperlipidemia, unspecified: Secondary | ICD-10-CM | POA: Diagnosis present

## 2019-02-07 DIAGNOSIS — I129 Hypertensive chronic kidney disease with stage 1 through stage 4 chronic kidney disease, or unspecified chronic kidney disease: Secondary | ICD-10-CM | POA: Diagnosis present

## 2019-02-07 DIAGNOSIS — D509 Iron deficiency anemia, unspecified: Secondary | ICD-10-CM | POA: Diagnosis present

## 2019-02-07 DIAGNOSIS — I480 Paroxysmal atrial fibrillation: Secondary | ICD-10-CM | POA: Diagnosis not present

## 2019-02-07 DIAGNOSIS — R338 Other retention of urine: Secondary | ICD-10-CM | POA: Diagnosis present

## 2019-02-07 DIAGNOSIS — E1142 Type 2 diabetes mellitus with diabetic polyneuropathy: Secondary | ICD-10-CM | POA: Diagnosis present

## 2019-02-07 DIAGNOSIS — E1152 Type 2 diabetes mellitus with diabetic peripheral angiopathy with gangrene: Secondary | ICD-10-CM | POA: Diagnosis present

## 2019-02-07 DIAGNOSIS — N400 Enlarged prostate without lower urinary tract symptoms: Secondary | ICD-10-CM | POA: Diagnosis present

## 2019-02-07 DIAGNOSIS — I70261 Atherosclerosis of native arteries of extremities with gangrene, right leg: Secondary | ICD-10-CM | POA: Diagnosis not present

## 2019-02-07 DIAGNOSIS — N138 Other obstructive and reflux uropathy: Secondary | ICD-10-CM | POA: Diagnosis not present

## 2019-02-07 DIAGNOSIS — Z89611 Acquired absence of right leg above knee: Secondary | ICD-10-CM

## 2019-02-07 DIAGNOSIS — Z89612 Acquired absence of left leg above knee: Secondary | ICD-10-CM | POA: Diagnosis not present

## 2019-02-07 DIAGNOSIS — E1122 Type 2 diabetes mellitus with diabetic chronic kidney disease: Secondary | ICD-10-CM | POA: Diagnosis present

## 2019-02-07 HISTORY — PX: AMPUTATION: SHX166

## 2019-02-07 HISTORY — DX: Neuralgia and neuritis, unspecified: M79.2

## 2019-02-07 LAB — BASIC METABOLIC PANEL
Anion gap: 10 (ref 5–15)
BUN: 23 mg/dL (ref 8–23)
CO2: 25 mmol/L (ref 22–32)
Calcium: 8.7 mg/dL — ABNORMAL LOW (ref 8.9–10.3)
Chloride: 92 mmol/L — ABNORMAL LOW (ref 98–111)
Creatinine, Ser: 1.14 mg/dL (ref 0.61–1.24)
GFR calc Af Amer: >60 mL/min (ref >60)
GFR calc non Af Amer: 55 mL/min — ABNORMAL LOW (ref >60)
Glucose, Bld: 124 mg/dL — ABNORMAL HIGH (ref 70–99)
Potassium: 5.1 mmol/L (ref 3.5–5.1)
Sodium: 127 mmol/L — ABNORMAL LOW (ref 135–145)

## 2019-02-07 LAB — GLUCOSE, CAPILLARY
Glucose-Capillary: 138 mg/dL — ABNORMAL HIGH (ref 70–99)
Glucose-Capillary: 125 mg/dL — ABNORMAL HIGH (ref 70–99)
Glucose-Capillary: 171 mg/dL — ABNORMAL HIGH (ref 70–99)
Glucose-Capillary: 192 mg/dL — ABNORMAL HIGH (ref 70–99)

## 2019-02-07 LAB — CBC
HCT: 34.4 % — ABNORMAL LOW (ref 39.0–52.0)
Hemoglobin: 11.8 g/dL — ABNORMAL LOW (ref 13.0–17.0)
MCH: 33.4 pg (ref 26.0–34.0)
MCHC: 34.3 g/dL (ref 30.0–36.0)
MCV: 97.5 fL (ref 80.0–100.0)
Platelets: 448 10*3/uL — ABNORMAL HIGH (ref 150–400)
RBC: 3.53 MIL/uL — ABNORMAL LOW (ref 4.22–5.81)
RDW: 12.8 % (ref 11.5–15.5)
WBC: 9.7 10*3/uL (ref 4.0–10.5)
nRBC: 0 % (ref 0.0–0.2)

## 2019-02-07 LAB — PROTIME-INR
INR: 1 (ref 0.8–1.2)
Prothrombin Time: 13.5 seconds (ref 11.4–15.2)

## 2019-02-07 LAB — SARS CORONAVIRUS 2 BY RT PCR (HOSPITAL ORDER, PERFORMED IN ~~LOC~~ HOSPITAL LAB): SARS Coronavirus 2: NEGATIVE

## 2019-02-07 SURGERY — AMPUTATION, ABOVE KNEE
Anesthesia: General | Laterality: Right

## 2019-02-07 MED ORDER — LACTATED RINGERS IV SOLN
INTRAVENOUS | Status: DC
  Administered 2019-02-07: 13:00:00 via INTRAVENOUS

## 2019-02-07 MED ORDER — FENTANYL CITRATE (PF) 100 MCG/2ML IJ SOLN
25.0000 ug | INTRAMUSCULAR | Status: DC | PRN
  Administered 2019-02-07: 50 ug via INTRAVENOUS
  Administered 2019-02-07: 25 ug via INTRAVENOUS
  Administered 2019-02-07: 50 ug via INTRAVENOUS

## 2019-02-07 MED ORDER — OXYCODONE HCL 5 MG PO TABS
ORAL_TABLET | ORAL | Status: AC
  Filled 2019-02-07: qty 1

## 2019-02-07 MED ORDER — SODIUM CHLORIDE 0.9 % IV SOLN
INTRAVENOUS | Status: DC
  Administered 2019-02-07 – 2019-02-08 (×2): via INTRAVENOUS

## 2019-02-07 MED ORDER — PHENYLEPHRINE 40 MCG/ML (10ML) SYRINGE FOR IV PUSH (FOR BLOOD PRESSURE SUPPORT)
PREFILLED_SYRINGE | INTRAVENOUS | Status: DC | PRN
  Administered 2019-02-07: 200 ug via INTRAVENOUS
  Administered 2019-02-07: 120 ug via INTRAVENOUS

## 2019-02-07 MED ORDER — FENTANYL CITRATE (PF) 100 MCG/2ML IJ SOLN
INTRAMUSCULAR | Status: AC
  Filled 2019-02-07: qty 2

## 2019-02-07 MED ORDER — MORPHINE SULFATE (PF) 2 MG/ML IV SOLN: 2.0000 mg | INTRAVENOUS | Status: DC | PRN

## 2019-02-07 MED ORDER — ONDANSETRON HCL 4 MG/2ML IJ SOLN: 4.0000 mg | Freq: Four times a day (QID) | INTRAMUSCULAR | Status: DC | PRN

## 2019-02-07 MED ORDER — HYDROMORPHONE HCL 1 MG/ML IJ SOLN
INTRAMUSCULAR | Status: AC
  Filled 2019-02-07: qty 1

## 2019-02-07 MED ORDER — OXYCODONE HCL 5 MG PO TABS
5.0000 mg | ORAL_TABLET | Freq: Once | ORAL | Status: AC | PRN
  Administered 2019-02-07: 5 mg via ORAL

## 2019-02-07 MED ORDER — ONDANSETRON HCL 4 MG/2ML IJ SOLN: 4.0000 mg | Freq: Once | INTRAMUSCULAR | Status: DC | PRN

## 2019-02-07 MED ORDER — DEXAMETHASONE SODIUM PHOSPHATE 10 MG/ML IJ SOLN
INTRAMUSCULAR | Status: DC | PRN
  Administered 2019-02-07: 10 mg via INTRAVENOUS

## 2019-02-07 MED ORDER — OXYCODONE HCL 5 MG/5ML PO SOLN: 5.0000 mg | Freq: Once | ORAL | Status: AC | PRN

## 2019-02-07 MED ORDER — FENTANYL CITRATE (PF) 250 MCG/5ML IJ SOLN
INTRAMUSCULAR | Status: AC
  Filled 2019-02-07: qty 5

## 2019-02-07 MED ORDER — ONDANSETRON HCL 4 MG/2ML IJ SOLN
INTRAMUSCULAR | Status: DC | PRN
  Administered 2019-02-07: 4 mg via INTRAVENOUS

## 2019-02-07 MED ORDER — HEPARIN SODIUM (PORCINE) 5000 UNIT/ML IJ SOLN
5000.0000 [IU] | Freq: Three times a day (TID) | INTRAMUSCULAR | Status: DC
  Administered 2019-02-08 – 2019-02-10 (×7): 5000 [IU] via SUBCUTANEOUS
  Filled 2019-02-07 (×7): qty 1

## 2019-02-07 MED ORDER — PHENYLEPHRINE 40 MCG/ML (10ML) SYRINGE FOR IV PUSH (FOR BLOOD PRESSURE SUPPORT)
PREFILLED_SYRINGE | INTRAVENOUS | Status: AC
  Filled 2019-02-07: qty 20

## 2019-02-07 MED ORDER — ACETAMINOPHEN 650 MG RE SUPP: 650.0000 mg | Freq: Four times a day (QID) | RECTAL | Status: DC | PRN

## 2019-02-07 MED ORDER — ROCURONIUM BROMIDE 50 MG/5ML IV SOSY
PREFILLED_SYRINGE | INTRAVENOUS | Status: DC | PRN
  Administered 2019-02-07: 40 mg via INTRAVENOUS

## 2019-02-07 MED ORDER — DEXAMETHASONE SODIUM PHOSPHATE 10 MG/ML IJ SOLN
INTRAMUSCULAR | Status: AC
  Filled 2019-02-07: qty 1

## 2019-02-07 MED ORDER — ONDANSETRON HCL 4 MG/2ML IJ SOLN
INTRAMUSCULAR | Status: AC
  Filled 2019-02-07: qty 2

## 2019-02-07 MED ORDER — LIDOCAINE 2% (20 MG/ML) 5 ML SYRINGE
INTRAMUSCULAR | Status: DC | PRN
  Administered 2019-02-07: 60 mg via INTRAVENOUS

## 2019-02-07 MED ORDER — CEFAZOLIN SODIUM-DEXTROSE 1-4 GM/50ML-% IV SOLN
1.0000 g | Freq: Three times a day (TID) | INTRAVENOUS | Status: AC
  Administered 2019-02-08 (×2): 1 g via INTRAVENOUS
  Filled 2019-02-07 (×2): qty 50

## 2019-02-07 MED ORDER — INSULIN ASPART 100 UNIT/ML ~~LOC~~ SOLN
0.0000 [IU] | Freq: Every day | SUBCUTANEOUS | Status: DC
  Administered 2019-02-08: 3 [IU] via SUBCUTANEOUS

## 2019-02-07 MED ORDER — ACETAMINOPHEN 10 MG/ML IV SOLN
1000.0000 mg | Freq: Once | INTRAVENOUS | Status: AC
  Administered 2019-02-07: 1000 mg via INTRAVENOUS

## 2019-02-07 MED ORDER — INSULIN ASPART 100 UNIT/ML ~~LOC~~ SOLN
0.0000 [IU] | Freq: Three times a day (TID) | SUBCUTANEOUS | Status: DC
  Administered 2019-02-08: 3 [IU] via SUBCUTANEOUS
  Administered 2019-02-08 – 2019-02-09 (×3): 2 [IU] via SUBCUTANEOUS
  Administered 2019-02-09 (×2): 1 [IU] via SUBCUTANEOUS
  Administered 2019-02-10: 2 [IU] via SUBCUTANEOUS
  Administered 2019-02-10: 1 [IU] via SUBCUTANEOUS
  Administered 2019-02-10: 2 [IU] via SUBCUTANEOUS

## 2019-02-07 MED ORDER — EPHEDRINE SULFATE-NACL 50-0.9 MG/10ML-% IV SOSY
PREFILLED_SYRINGE | INTRAVENOUS | Status: DC | PRN
  Administered 2019-02-07: 15 mg via INTRAVENOUS

## 2019-02-07 MED ORDER — PROPOFOL 10 MG/ML IV BOLUS
INTRAVENOUS | Status: AC
  Filled 2019-02-07: qty 20

## 2019-02-07 MED ORDER — ACETAMINOPHEN 325 MG PO TABS
650.0000 mg | ORAL_TABLET | Freq: Four times a day (QID) | ORAL | Status: DC | PRN
  Administered 2019-02-08 – 2019-02-09 (×4): 650 mg via ORAL
  Filled 2019-02-07 (×4): qty 2

## 2019-02-07 MED ORDER — CEFAZOLIN SODIUM-DEXTROSE 2-4 GM/100ML-% IV SOLN
2.0000 g | INTRAVENOUS | Status: AC
  Administered 2019-02-07: 2 g via INTRAVENOUS
  Filled 2019-02-07: qty 100

## 2019-02-07 MED ORDER — SUGAMMADEX SODIUM 200 MG/2ML IV SOLN
INTRAVENOUS | Status: DC | PRN
  Administered 2019-02-07: 120 mg via INTRAVENOUS

## 2019-02-07 MED ORDER — FENTANYL CITRATE (PF) 100 MCG/2ML IJ SOLN: 25.0000 ug | INTRAMUSCULAR | Status: DC | PRN

## 2019-02-07 MED ORDER — 0.9 % SODIUM CHLORIDE (POUR BTL) OPTIME
TOPICAL | Status: DC | PRN
  Administered 2019-02-07: 1000 mL

## 2019-02-07 MED ORDER — CHLORHEXIDINE GLUCONATE CLOTH 2 % EX PADS: 6.0000 | MEDICATED_PAD | Freq: Once | CUTANEOUS | Status: DC

## 2019-02-07 MED ORDER — HYDROMORPHONE HCL 1 MG/ML IJ SOLN
0.2500 mg | INTRAMUSCULAR | Status: DC | PRN
  Administered 2019-02-07 (×3): 0.25 mg via INTRAVENOUS

## 2019-02-07 MED ORDER — PROPOFOL 10 MG/ML IV BOLUS
INTRAVENOUS | Status: DC | PRN
  Administered 2019-02-07: 60 mg via INTRAVENOUS

## 2019-02-07 MED ORDER — ONDANSETRON HCL 4 MG PO TABS: 4.0000 mg | ORAL_TABLET | Freq: Four times a day (QID) | ORAL | Status: DC | PRN

## 2019-02-07 MED ORDER — FENTANYL CITRATE (PF) 100 MCG/2ML IJ SOLN
INTRAMUSCULAR | Status: DC | PRN
  Administered 2019-02-07 (×4): 50 ug via INTRAVENOUS

## 2019-02-07 MED ORDER — ACETAMINOPHEN 10 MG/ML IV SOLN
INTRAVENOUS | Status: AC
  Filled 2019-02-07: qty 100

## 2019-02-07 MED ORDER — OXYCODONE-ACETAMINOPHEN 5-325 MG PO TABS: 1.0000 | ORAL_TABLET | ORAL | Status: DC | PRN

## 2019-02-07 SURGICAL SUPPLY — 46 items
BANDAGE ESMARK 6X9 LF (GAUZE/BANDAGES/DRESSINGS) IMPLANT
BLADE SAW GIGLI 510 (BLADE) ×2 IMPLANT
BNDG ELASTIC 4X5.8 VLCR STR LF (GAUZE/BANDAGES/DRESSINGS) ×2 IMPLANT
BNDG ELASTIC 6X10 VLCR STRL LF (GAUZE/BANDAGES/DRESSINGS) ×1 IMPLANT
BNDG ELASTIC 6X5.8 VLCR STR LF (GAUZE/BANDAGES/DRESSINGS) ×2 IMPLANT
BNDG ESMARK 6X9 LF (GAUZE/BANDAGES/DRESSINGS)
BNDG GAUZE ELAST 4 BULKY (GAUZE/BANDAGES/DRESSINGS) ×3 IMPLANT
CANISTER SUCT 3000ML PPV (MISCELLANEOUS) ×2 IMPLANT
CLIP LIGATING EXTRA MED SLVR (CLIP) ×2 IMPLANT
CLIP LIGATING EXTRA SM BLUE (MISCELLANEOUS) ×2 IMPLANT
COVER SURGICAL LIGHT HANDLE (MISCELLANEOUS) ×4 IMPLANT
COVER WAND RF STERILE (DRAPES) ×2 IMPLANT
CUFF TOURN SGL QUICK 34 (TOURNIQUET CUFF)
CUFF TOURN SGL QUICK 42 (TOURNIQUET CUFF) IMPLANT
CUFF TRNQT CYL 34X4.125X (TOURNIQUET CUFF) IMPLANT
DRAIN SNY 10X20 3/4 PERF (WOUND CARE) IMPLANT
DRAPE HALF SHEET 40X57 (DRAPES) ×2 IMPLANT
DRAPE ORTHO SPLIT 77X108 STRL (DRAPES) ×2
DRAPE SURG ORHT 6 SPLT 77X108 (DRAPES) ×2 IMPLANT
DRSG ADAPTIC 3X8 NADH LF (GAUZE/BANDAGES/DRESSINGS) ×1 IMPLANT
ELECT CAUTERY BLADE 6.4 (BLADE) ×2 IMPLANT
ELECT REM PT RETURN 9FT ADLT (ELECTROSURGICAL) ×2
ELECTRODE REM PT RTRN 9FT ADLT (ELECTROSURGICAL) ×1 IMPLANT
EVACUATOR SILICONE 100CC (DRAIN) IMPLANT
GAUZE SPONGE 4X4 12PLY STRL (GAUZE/BANDAGES/DRESSINGS) ×3 IMPLANT
GAUZE XEROFORM 5X9 LF (GAUZE/BANDAGES/DRESSINGS) ×2 IMPLANT
GLOVE SS BIOGEL STRL SZ 7.5 (GLOVE) ×1 IMPLANT
GLOVE SUPERSENSE BIOGEL SZ 7.5 (GLOVE) ×1
GOWN STRL REUS W/ TWL LRG LVL3 (GOWN DISPOSABLE) ×3 IMPLANT
GOWN STRL REUS W/TWL LRG LVL3 (GOWN DISPOSABLE) ×3
KIT BASIN OR (CUSTOM PROCEDURE TRAY) ×2 IMPLANT
KIT TURNOVER KIT B (KITS) ×2 IMPLANT
NS IRRIG 1000ML POUR BTL (IV SOLUTION) ×2 IMPLANT
PACK GENERAL/GYN (CUSTOM PROCEDURE TRAY) ×2 IMPLANT
PAD ARMBOARD 7.5X6 YLW CONV (MISCELLANEOUS) ×4 IMPLANT
PADDING CAST COTTON 6X4 STRL (CAST SUPPLIES) IMPLANT
STAPLER VISISTAT 35W (STAPLE) ×2 IMPLANT
STOCKINETTE IMPERVIOUS LG (DRAPES) ×2 IMPLANT
SUT ETHILON 3 0 PS 1 (SUTURE) IMPLANT
SUT VIC AB 0 CT1 18XCR BRD 8 (SUTURE) ×2 IMPLANT
SUT VIC AB 0 CT1 8-18 (SUTURE) ×2
SUT VICRYL 0 TIES 12 18 (SUTURE) ×1 IMPLANT
SUT VICRYL AB 2 0 TIES (SUTURE) ×2 IMPLANT
TOWEL GREEN STERILE (TOWEL DISPOSABLE) ×4 IMPLANT
UNDERPAD 30X30 (UNDERPADS AND DIAPERS) ×2 IMPLANT
WATER STERILE IRR 1000ML POUR (IV SOLUTION) ×2 IMPLANT

## 2019-02-07 NOTE — Transfer of Care (Signed)
Immediate Anesthesia Transfer of Care Note  Patient: Nathan Cook  Procedure(s) Performed: AMPUTATION ABOVE KNEE (Right )  Patient Location: PACU  Anesthesia Type:General  Level of Consciousness: drowsy and patient cooperative  Airway & Oxygen Therapy: Patient Spontanous Breathing  Post-op Assessment: Report given to RN and Post -op Vital signs reviewed and stable  Post vital signs: Reviewed and stable  Last Vitals:  Vitals Value Taken Time  BP 141/62 02/07/19 1815  Temp    Pulse 79 02/07/19 1817  Resp 21 02/07/19 1817  SpO2 95 % 02/07/19 1817  Vitals shown include unvalidated device data.  Last Pain:  Vitals:   02/07/19 1314  TempSrc:   PainSc: 6       Patients Stated Pain Goal: 2 (72/15/87 2761)  Complications: No apparent anesthesia complications

## 2019-02-07 NOTE — Anesthesia Procedure Notes (Signed)
Procedure Name: Intubation Date/Time: 02/07/2019 5:08 PM Performed by: Lance Coon, CRNA Pre-anesthesia Checklist: Patient identified, Emergency Drugs available, Suction available, Patient being monitored and Timeout performed Patient Re-evaluated:Patient Re-evaluated prior to induction Oxygen Delivery Method: Circle system utilized Preoxygenation: Pre-oxygenation with 100% oxygen Induction Type: IV induction Ventilation: Mask ventilation without difficulty Laryngoscope Size: Miller and 3 Grade View: Grade I Tube type: Oral Tube size: 7.5 mm Number of attempts: 1 Airway Equipment and Method: Stylet Placement Confirmation: ETT inserted through vocal cords under direct vision,  positive ETCO2 and breath sounds checked- equal and bilateral Secured at: 21 cm Tube secured with: Tape Dental Injury: Teeth and Oropharynx as per pre-operative assessment

## 2019-02-07 NOTE — Progress Notes (Signed)
Received patient to room 4e23 from PACU, patient altert and oriented without complaints. Tele monitor applied and CCMD notified. CHG bath given. Oriented to room and call bell within reach.   Tawanna Sat, RN 11:15 PM 02/07/2019

## 2019-02-07 NOTE — H&P (Signed)
History and Physical   Nathan Cook JJK:[id number] DOB: 1921/10/20 DOA: 02/07/2019  Referring MD/NP/PA: Dr. Curt Jews, vascular surgeon  PCP: Virgie Dad, MD   Outpatient Specialists: Dr. Donnetta Hutching  Patient coming from: Home  Chief Complaint: Status post right AKA  HPI: Nathan Cook is a 85 y.o. male with medical history significant of peripheral vascular disease, chronic kidney disease stage III, diabetes, hypertension, history of left AKA, hyperlipidemia and dysphagia who had gangrene of the right foot following previous AKA for gangrene of the left foot back in April of this year.  Patient was evaluated and required right above-knee amputation.  Surgery was done successfully tonight.  He however has multiple medical problems as indicated above and required admission to medical service for medical management.  Vascular surgery will follow.  Patient has no new complaint postoperatively.  He has done well and has recovered very well.  He is being admitted for full treatment as well as disposition..  ED Course: Temperature is 97.2 blood pressure 140/85 pulse 76 respiratory 22 oxygen sats 100% on room air.  White count is 9.7 hemoglobin 11.8 and platelets 448.  Sodium 127 potassium 5.1 chloride 92 CO2 25 BUN 23 creatinine 1.14 and calcium 8.7.  Glucose 124 and INR 1.0.  Review of Systems: As per HPI otherwise 10 point review of systems negative.    Past Medical History:  Diagnosis Date  . BPH (benign prostatic hyperplasia) 10/14/2014  . Cervicalgia 01/04/2016  . Chronic kidney disease   . Contusion of left shoulder 05/27/14  . DM type 2 (diabetes mellitus, type 2) ([place])   . Dysphagia   . High blood pressure   . History of basal cell cancer 11/21/2005   Right temple  . History of colonic polyps 10/14/2003  . Hyperlipidemia   . Left knee pain 05/27/14  . Peripheral neuropathic pain   . Peripheral vascular disease ([place])   . SAH (subarachnoid hemorrhage) ([place]) 2009   TBI  .  Stroke ([place])   . TIA (transient ischemic attack) 11/06/2005   Left eye pain. Slurred speech. Diaphoresis. No residual effects.     Past Surgical History:  Procedure Laterality Date  . ABDOMINAL AORTOGRAM W/LOWER EXTREMITY N/A 06/12/2018   Procedure: ABDOMINAL AORTOGRAM W/LOWER EXTREMITY;  Surgeon: Marty Heck, MD;  Location: [facility];  Service: Cardiovascular;  Laterality: N/A;  . Achilles tendon repair Left 1998   ruptured tendon  . AMPUTATION Left 10/11/2018   Procedure: Left  AMPUTATION ABOVE KNEE;  Surgeon: Marty Heck, MD;  Location: [facility];  Service: Vascular;  Laterality: Left;  . COLONOSCOPY  1992   Polyp removed  . COLONOSCOPY  1998   normal  . ORIF FEMUR FRACTURE Right 1929  . PERIPHERAL VASCULAR INTERVENTION Left 06/12/2018   Procedure: PERIPHERAL VASCULAR INTERVENTION;  Surgeon: Marty Heck, MD;  Location: [facility];  Service: Cardiovascular;  Laterality: Left;  Attempted   . TONSILLECTOMY       reports that he quit smoking about 47 years ago. He has never used smokeless tobacco. He reports previous alcohol use of about 1.0 - 2.0 standard drinks of alcohol per week. He reports that he does not use drugs.  Allergies  Allergen Reactions  . Tape Other (See Comments)    TAPE WILL TEAR THE SKIN!!!!    Family History  Problem Relation Age of Onset  . Heart attack Mother   . Heart disease Mother   . Heart disease Father  Prior to Admission medications   Medication Sig Start Date End Date Taking? Authorizing Provider  acetaminophen (TYLENOL 8 HOUR) 650 MG CR tablet Take 1 tablet (650 mg total) by mouth every 8 (eight) hours as needed for pain. 09/19/17  Yes Blanchie Serve, MD  Amino Acids-Protein Hydrolys (FEEDING SUPPLEMENT, PRO-STAT SUGAR FREE 64,) LIQD Take 30 mLs by mouth daily.   Yes [provider]  amoxicillin-clavulanate (AUGMENTIN) 500-125 MG tablet Take 1 tablet by mouth 3 (three) times daily.   Yes  [provider]  apixaban (ELIQUIS) 2.5 MG TABS tablet Take 2.5 mg by mouth 2 (two) times daily.   Yes [provider]  Calcium Carbonate-Vitamin D 600-400 MG-UNIT tablet Take 1 tablet by mouth daily.   Yes [provider]  Cholecalciferol 1000 UNITS tablet Take 1,000 Units by mouth daily.   Yes [provider]  ferrous sulfate 325 (65 FE) MG tablet Take 325 mg by mouth every Monday, Wednesday, and Friday.    Yes [provider]  gabapentin (NEURONTIN) 100 MG capsule TAKE ONE CAPSULE BY MOUTH EVERY NIGHT AT BEDTIME Patient taking differently: Take 100 mg by mouth at bedtime.  09/23/18  Yes Virgie Dad, MD  losartan (COZAAR) 25 MG tablet Take 25 mg by mouth daily.   Yes [provider]  metFORMIN (GLUCOPHAGE) 500 MG tablet Take 1 tablet (500 mg total) by mouth 2 (two) times daily with a meal. 06/18/18  Yes Wardell Honour, MD  metoprolol tartrate (LOPRESSOR) 25 MG tablet Take 1 tablet (25 mg total) by mouth 2 (two) times daily. 06/18/18  Yes Wardell Honour, MD  Nutritional Supplements (BOOST GLUCOSE CONTROL) LIQD Take 237 mLs by mouth 2 (two) times daily between meals. Twice a day between meals    Yes [provider]  potassium chloride (K-DUR) 10 MEQ tablet Take 10 mEq by mouth every Monday, Wednesday, and Friday.    Yes [provider]  Propylene Glycol-Glycerin 1-0.3 % SOLN Place 2 drops into both eyes daily as needed (dry eyes).    Yes [provider]  saccharomyces boulardii (FLORASTOR) 250 MG capsule Take 250 mg by mouth 2 (two) times daily.   Yes [provider]  simvastatin (ZOCOR) 10 MG tablet TAKE ONE TABLET BY MOUTH DAILY Patient taking differently: Take 10 mg by mouth every evening.  11/05/18  Yes Virgie Dad, MD  zinc oxide 20 % ointment Apply 1 application topically as needed for irritation. Apply to buttocks after every incontinent episode and as needed for redness   Yes [provider]    Physical Exam: Vitals:   02/07/19 1238 02/07/19 1815  BP: 131/65 (!) 141/62  Pulse: 69 70  Resp: 20 16  Temp: 97.6 F (36.4 C) (!) (P) 97.2 F (36.2 C)  TempSrc: Oral   SpO2: 95% 95%  Weight: 57.6 kg   Height: 5\' 8"  (1.727 m)       Constitutional: NAD, calm, comfortable Vitals:   02/07/19 1238 02/07/19 1815  BP: 131/65 (!) 141/62  Pulse: 69 70  Resp: 20 16  Temp: 97.6 F (36.4 C) (!) (P) 97.2 F (36.2 C)  TempSrc: Oral   SpO2: 95% 95%  Weight: 57.6 kg   Height: 5\' 8"  (1.727 m)    Eyes: PERRL, lids and conjunctivae normal ENMT: Mucous membranes are moist. Posterior pharynx clear of any exudate or lesions.Normal dentition.  Neck: normal, supple, no masses, no thyromegaly Respiratory: clear to auscultation bilaterally, no wheezing, no crackles. Normal  respiratory effort. No accessory muscle use.  Cardiovascular: Irregular but controlled no murmurs / rubs / gallops. No carotid bruits.  Abdomen: no tenderness, no masses palpated. No hepatosplenomegaly. Bowel sounds positive.  Musculoskeletal: Bilateral above-knee amputations with left stump healed but right stump dressed Skin: no rashes, lesions, ulcers. No induration Neurologic: CN 2-12 grossly intact. Sensation intact, DTR normal. Strength 5/5 in all 4.  Psychiatric: Normal judgment and insight. Alert and oriented x 3. Normal mood.     Labs on Admission: I have personally reviewed following labs and imaging studies  CBC: Recent Labs  Lab 02/07/19 1233  WBC 9.7  HGB 11.8*  HCT 34.4*  MCV 97.5  PLT 454*   Basic Metabolic Panel: Recent Labs  Lab 02/07/19 1233  NA 127*  K 5.1  CL 92*  CO2 25  GLUCOSE 124*  BUN 23  CREATININE 1.14  CALCIUM 8.7*   GFR: Estimated Creatinine Clearance: 32.3 mL/min (by C-G formula based on SCr of 1.14 mg/dL). Liver Function Tests: No results for input(s): AST, ALT, ALKPHOS, BILITOT, PROT, ALBUMIN in the last 168 hours. No results for input(s): LIPASE,  AMYLASE in the last 168 hours. No results for input(s): AMMONIA in the last 168 hours. Coagulation Profile: Recent Labs  Lab 02/07/19 1233  INR 1.0   Cardiac Enzymes: No results for input(s): CKTOTAL, CKMB, CKMBINDEX, TROPONINI in the last 168 hours. BNP (last 3 results) No results for input(s): PROBNP in the last 8760 hours. HbA1C: No results for input(s): HGBA1C in the last 72 hours. CBG: Recent Labs  Lab 02/07/19 1238 02/07/19 1437 02/07/19 1818  GLUCAP 138* 125* 171*   Lipid Profile: No results for input(s): CHOL, HDL, LDLCALC, TRIG, CHOLHDL, LDLDIRECT in the last 72 hours. Thyroid Function Tests: No results for input(s): TSH, T4TOTAL, FREET4, T3FREE, THYROIDAB in the last 72 hours. Anemia Panel: No results for input(s): VITAMINB12, FOLATE, FERRITIN, TIBC, IRON, RETICCTPCT in the last 72 hours. Urine analysis:    Component Value Date/Time   COLORURINE YELLOW 04/03/2018 0928   APPEARANCEUR CLEAR 04/03/2018 0928   LABSPEC 1.027 04/03/2018 0928   PHURINE 5.0 04/03/2018 0928   GLUCOSEU 50 (A) 04/03/2018 0928   HGBUR MODERATE (A) 04/03/2018 0928   BILIRUBINUR NEGATIVE 04/03/2018 0928   KETONESUR 20 (A) 04/03/2018 0928   PROTEINUR 30 (A) 04/03/2018 0928   NITRITE NEGATIVE 04/03/2018 0928   LEUKOCYTESUR NEGATIVE 04/03/2018 0928   Sepsis Labs: @LABRCNTIP (procalcitonin:4,lacticidven:4) ) Recent Results (from the past 240 hour(s))  SARS Coronavirus 2 [facility] order, Performed in [facility] hospital lab) Nasopharyngeal Nasopharyngeal Swab     Status: None   Collection Time: 02/07/19 12:31 PM   Specimen: Nasopharyngeal Swab  Result Value Ref Range Status   SARS Coronavirus 2 NEGATIVE NEGATIVE Final    Comment: (NOTE) If result is NEGATIVE SARS-CoV-2 target nucleic acids are NOT DETECTED. The SARS-CoV-2 RNA is generally detectable in upper and lower  respiratory specimens during the acute phase of infection. The lowest  concentration of SARS-CoV-2 viral copies this  assay can detect is 250  copies / mL. A negative result does not preclude SARS-CoV-2 infection  and should not be used as the sole basis for treatment or other  patient management decisions.  A negative result may occur with  improper specimen collection / handling, submission of specimen other  than nasopharyngeal swab, presence of viral mutation(s) within the  areas targeted by this assay, and inadequate number of viral copies  (<250 copies / mL). A negative result must be combined  with clinical  observations, patient history, and epidemiological information. If result is POSITIVE SARS-CoV-2 target nucleic acids are DETECTED. The SARS-CoV-2 RNA is generally detectable in upper and lower  respiratory specimens dur ing the acute phase of infection.  Positive  results are indicative of active infection with SARS-CoV-2.  Clinical  correlation with patient history and other diagnostic information is  necessary to determine patient infection status.  Positive results do  not rule out bacterial infection or co-infection with other viruses. If result is PRESUMPTIVE POSTIVE SARS-CoV-2 nucleic acids MAY BE PRESENT.   A presumptive positive result was obtained on the submitted specimen  and confirmed on repeat testing.  While 2019 novel coronavirus  (SARS-CoV-2) nucleic acids may be present in the submitted sample  additional confirmatory testing may be necessary for epidemiological  and / or clinical management purposes  to differentiate between  SARS-CoV-2 and other Sarbecovirus currently known to infect humans.  If clinically indicated additional testing with an alternate test  methodology [phone]) is advised. The SARS-CoV-2 RNA is generally  detectable in upper and lower respiratory sp ecimens during the acute  phase of infection. The expected result is Negative. Fact Sheet for Patients:  StrictlyIdeas.no Fact Sheet for Healthcare Providers:  BankingDealers.co.za This test is not yet approved or cleared by the [place] FDA and has been authorized for detection and/or diagnosis of SARS-CoV-2 by FDA under an Emergency Use Authorization (EUA).  This EUA will remain in effect (meaning this test can be used) for the duration of the COVID-19 declaration under Section 564(b)(1) of the Act, 21 U.S.C. section 360bbb-3(b)(1), unless the authorization is terminated or revoked sooner. Performed at [facility], [place] [address], [place], [place] [address]      Radiological Exams on Admission: No results found.  Assessment/Plan Principal Problem:   S/P AKA (above knee amputation) unilateral, right (HCC) Active Problems:   Hyperlipidemia LDL goal <70   Benign hypertension with chronic kidney disease, stage III (HCC)   Controlled type 2 diabetes mellitus with stage 3 chronic kidney disease, without long-term current use of insulin (HCC)   Brainstem infarct, acute (HCC) s/p IV tPA   BPH (benign prostatic hyperplasia)   History of TIA (transient ischemic attack)   Generalized osteoarthritis   PAD (peripheral artery disease) (HCC)   Paroxysmal atrial fibrillation (HCC)   Critical lower limb ischemia   S/P AKA (above knee amputation) unilateral, left ([place])     #1  Status post right AKA: Patient appears to be doing better postoperatively.  We will continue care per his vascular surgeon.  Wound care accordingly.  PT and OT consultation  #2 hyponatremia: Chronic.  Asymptomatic.  Continue saline and free water restriction.  #3 paroxysmal atrial fibrillation: Stable at this point.  Resume home regimen.  Avoid anticoagulation in the immediate postoperative..  #4 diabetes: Blood sugar is controlled.  Continue management.  #5 history of CVA: Mild residual defects.  Patient otherwise stable.  #6 history of BPH: Stable and will continue with home regimen.     DVT prophylaxis: Subcu heparin from tomorrow  Code Status: Full code Family Communication: No family at bedside Disposition Plan: To be determined most likely skilled nursing facility Consults called: Dr. Sherren Mocha early Admission status: Inpatient  Severity of Illness: The appropriate patient status for this patient is INPATIENT. Inpatient status is judged to be reasonable and necessary in order to provide the required intensity of service to ensure the patient's safety. The patient's presenting symptoms, physical exam findings,  and initial radiographic and laboratory data in the context of their chronic comorbidities is felt to place them at high risk for further clinical deterioration. Furthermore, it is not anticipated that the patient will be medically stable for discharge from the hospital within 2 midnights of admission. The following factors support the patient status of inpatient.   " The patient's presenting symptoms include bilateral above-knee amputations. " The worrisome physical exam findings include gangrene of the right lower extremity. " The initial radiographic and laboratory data are worrisome because of poor circulation. " The chronic co-morbidities include peripheral vascular disease.   * I certify that at the point of admission it is my clinical judgment that the patient will require inpatient hospital care spanning beyond 2 midnights from the point of admission due to high intensity of service, high risk for further deterioration and high frequency of surveillance required.Barbette Merino MD [facility] Pager [phone]  If 7PM-7AM, please contact night-coverage [email or web address] Password [facility]  02/07/2019, 6:34 PM

## 2019-02-07 NOTE — Op Note (Signed)
    OPERATIVE REPORT  DATE OF SURGERY: 02/07/2019  PATIENT: Nathan Cook, 85 y.o. male MRN: [id number]  DOB: 04/11/1922  PRE-OPERATIVE DIAGNOSIS: Gangrene right foot  POST-OPERATIVE DIAGNOSIS:  Same  PROCEDURE: Right above-knee amputation  SURGEON:  Curt Jews, M.D.  PHYSICIAN ASSISTANT: Matt Eveland, PA-C  ANESTHESIA: General  EBL: per anesthesia record  Total I/O In: 1000 [I.V.:1000] Out: 25 [Blood:25]  BLOOD ADMINISTERED: none  DRAINS: none  SPECIMEN: none  COUNTS CORRECT:  YES  PATIENT DISPOSITION:  PACU - hemodynamically stable  PROCEDURE DETAILS: Patient was taken up and placed vaginal area of the right lower extremity was prepped and draped in usual sterile fashion.  A fishmouth incision was made on the distal thigh and carried down through the fascia and muscle with electrocautery.  The larger vessels were occluded with hemostats and the smaller with cleared with electrocautery.  The femoral vein and superficial femoral arteries were ligated and divided.  The sciatic nerve was ligated and divided as well.  The periosteum was elevated off the femur and the femur was divided with a Gigli saw several centimeters above the skin H.  The edges of the femur were smoothed with a bone rasp.  The anterior fascia was closed to the posterior fascia with 0 Vicryl figure-of-eight sutures.  The wound again was irrigated with saline and skin was closed with skin staples.  A sterile dressing and Ace wrap were applied and the patient was transferred to the recovery room in stable condition   Rosetta Posner, M.D., [facility] 02/07/2019 6:29 PM

## 2019-02-07 NOTE — H&P (Signed)
Office Visit  11/12/2018 [facility] -Cyndia Skeeters, Gwenyth Allegra, MD Vascular Surgery  PAD (peripheral artery disease) [facility]) Dx  Routine Post Op   ; Referred by Virgie Dad, MD Reason for Visit  Additional Documentation  Vitals:   BP 93/48 (BP Location: Right Arm, Patient Position: Sitting, Cuff Size: Normal)  Pulse 58   Temp 96.8 F (36 C) (Oral)  Resp 16  Ht 5\' 8"  (1.727 m)  Wt 55.8 kg  SpO2 98%  BMI 18.70 kg/m  BSA 1.64 m  Flowsheets:   Clinical Intake,  Vital Signs,  MEWS Score,  Anthropometrics,  Method of Visit    Encounter Info:   Billing Info,  History,  Allergies,  Detailed Report    All Notes  Progress Notes by Marty Heck, MD at 11/12/2018 10:15 AM Author: Marty Heck, MD Author Type: Physician Filed: 11/12/2018 11:15 AM  Note Status: Signed Cosign: Cosign Not Required Encounter Date: 11/12/2018  Editor: Marty Heck, MD (Physician)      Patient name: Nathan Cook       MRN: [id number]        DOB: 1922-02-23          Sex: male  REASON FOR VISIT: Follow-up after left AKA  HPI: Nathan Cook is a 85 y.o. male with multiple medical problems including critical limb ischemia of the bilateral lower extremities with tissue loss that presents for postop follow-up.  He ultimately underwent left above-knee amputation on 10/11/2018 for non-reconstructable tibial disease.  He states his rest pain has completely resolved.  He is very happy with his progress.  His left above-knee amputation is healed no fevers drainage or other issues.  Has staples in place that were removed today.  He also has a right great toe wound with non-reconstructable disease of the right lower extremity and has previously undergone arteriogram.      Past Medical History:  Diagnosis Date  . BPH (benign prostatic hyperplasia) 10/14/2014  . Cervicalgia 01/04/2016  . Chronic kidney disease   . Contusion of left shoulder  05/27/14  . DM type 2 (diabetes mellitus, type 2) ([place])   . Dysphagia   . High blood pressure   . History of basal cell cancer 11/21/2005   Right temple  . History of colonic polyps 10/14/2003  . Hyperlipidemia   . Left knee pain 05/27/14  . Peripheral vascular disease ([place])   . SAH (subarachnoid hemorrhage) ([place]) 2009   TBI  . Stroke ([place])   . TIA (transient ischemic attack) 11/06/2005   Left eye pain. Slurred speech. Diaphoresis. No residual effects.          Past Surgical History:  Procedure Laterality Date  . ABDOMINAL AORTOGRAM W/LOWER EXTREMITY N/A 06/12/2018   Procedure: ABDOMINAL AORTOGRAM W/LOWER EXTREMITY;  Surgeon: Marty Heck, MD;  Location: [facility];  Service: Cardiovascular;  Laterality: N/A;  . Achilles tendon repair Left 1998   ruptured tendon  . AMPUTATION Left 10/11/2018   Procedure: Left  AMPUTATION ABOVE KNEE;  Surgeon: Marty Heck, MD;  Location: [facility];  Service: Vascular;  Laterality: Left;  . COLONOSCOPY  1992   Polyp removed  . COLONOSCOPY  1998   normal  . ORIF FEMUR FRACTURE Right 1929  . PERIPHERAL VASCULAR INTERVENTION Left 06/12/2018   Procedure: PERIPHERAL VASCULAR INTERVENTION;  Surgeon: Marty Heck, MD;  Location: [facility];  Service: Cardiovascular;  Laterality: Left;  Attempted   .  TONSILLECTOMY           Family History  Problem Relation Age of Onset  . Heart attack Mother   . Heart disease Mother   . Heart disease Father     SOCIAL HISTORY: Social History        Tobacco Use  . Smoking status: Former Smoker    Last attempt to quit: 06/26/1971    Years since quitting: 47.4  . Smokeless tobacco: Never Used  . Tobacco comment: 2 1/2 packs a day, until 1972  Substance Use Topics  . Alcohol use: Yes    Alcohol/week: 1.0 - 2.0 standard drinks    Types: 1 - 2 Glasses of wine per week    Comment: Wine nightly         Allergies  Allergen Reactions  .  Tape Other (See Comments)    TAPE WILL TEAR THE SKIN!!!!          Current Outpatient Medications  Medication Sig Dispense Refill  . acetaminophen (TYLENOL 8 HOUR) 650 MG CR tablet Take 1 tablet (650 mg total) by mouth every 8 (eight) hours as needed for pain. 60 tablet 0  . apixaban (ELIQUIS) 2.5 MG TABS tablet Take 2.5 mg by mouth 2 (two) times daily.    . Calcium Carb-Cholecalciferol (CALCIUM 600+D3 PO) Take 1 tablet by mouth daily.    . Cholecalciferol 1000 UNITS tablet Take 1,000 Units by mouth daily.    Marland Kitchen doxycycline (VIBRAMYCIN) 100 MG capsule Take 100 mg by mouth 2 (two) times daily.    . ferrous sulfate 325 (65 FE) MG tablet Take 325 mg by mouth daily with breakfast. Take 1 tablet on Monday, Wednesday and Friday.    . gabapentin (NEURONTIN) 100 MG capsule TAKE ONE CAPSULE BY MOUTH EVERY NIGHT AT BEDTIME 30 capsule 0  . losartan (COZAAR) 25 MG tablet Take 1 tablet (25 mg total) by mouth 2 (two) times daily. 180 tablet 1  . metFORMIN (GLUCOPHAGE) 500 MG tablet Take 1 tablet (500 mg total) by mouth 2 (two) times daily with a meal. 180 tablet 1  . metoprolol tartrate (LOPRESSOR) 25 MG tablet Take 1 tablet (25 mg total) by mouth 2 (two) times daily. 180 tablet 1  . Nutritional Supplements (BOOST GLUCOSE CONTROL) LIQD Take 237 mLs by mouth 2 (two) times a day. Twice a day between meals    . Propylene Glycol-Glycerin 1-0.3 % SOLN Place 2 drops into both eyes as needed (dry eyes).     . simvastatin (ZOCOR) 10 MG tablet TAKE ONE TABLET BY MOUTH DAILY 90 tablet 0  . zinc oxide 20 % ointment Apply 1 application topically as needed for irritation. Apply to buttocks after every incontinent episode and as needed for redness    . oxyCODONE-acetaminophen (PERCOCET/ROXICET) 5-325 MG tablet Take 1 tablet by mouth every 4 (four) hours as needed for moderate pain. (Patient not taking: Reported on 11/12/2018) 30 tablet 0   No current facility-administered medications for this visit.      REVIEW OF SYSTEMS:  [X]  denotes positive finding, [ ]  denotes negative finding Cardiac  Comments:  Chest pain or chest pressure:    Shortness of breath upon exertion:    Short of breath when lying flat:    Irregular heart rhythm:        Vascular    Pain in calf, thigh, or hip brought on by ambulation:    Pain in feet at night that wakes you up from your sleep:  Blood clot in your veins:    Leg swelling:         Pulmonary    Oxygen at home:    Productive cough:     Wheezing:         Neurologic    Sudden weakness in arms or legs:     Sudden numbness in arms or legs:     Sudden onset of difficulty speaking or slurred speech:    Temporary loss of vision in one eye:     Problems with dizziness:         Gastrointestinal    Blood in stool:     Vomited blood:         Genitourinary    Burning when urinating:     Blood in urine:        Psychiatric    Major depression:         Hematologic    Bleeding problems:    Problems with blood clotting too easily:        Skin    Rashes or ulcers:        Constitutional    Fever or chills:      PHYSICAL EXAM:    Vitals:   11/12/18 1037  BP: (!) 93/48  Pulse: (!) 58  Resp: 16  Temp: (!) 96.8 F (36 C)  TempSrc: Oral  SpO2: 98%  Weight: 123 lb (55.8 kg)  Height: 5\' 8"  (1.727 m)    GENERAL: The patient is a well-nourished male, in no acute distress. The vital signs are documented above. VASCULAR: L AKA well healed, staples removed, no drainage Right great toe ulcer, dependent rubor right foot, no drainage or active infection  DATA:   None  Assessment/Plan:  85 year old male status post left above-knee amputation for non-reconstructable tibial disease in the setting of critical limb ischemia with rest pain and tissue loss.  Amputation is healed very nicely and staples were removed today in clinic.  He does have a  wound on his right great toe and would require amputation on the right given unreconstructable tibial disease.  I offered him follow-up in 1 month to monitor this but he would prefer to call instead given that he is at [facility] in [place].  I would envision a right above-knee amputation if he has progression of rest pain or underlying infection that develops.  Plan for palliative wound care with Betadine paint for now.   Marty Heck, MD [facility] [place] Office: [phone] Pager: [phone]  Marty Heck      Addendum:  The patient has been re-examined and re-evaluated.  The patient's history and physical has been reviewed and is unchanged.    Nathan Cook is a 85 y.o. male is being admitted with NONVIABLE RIGHT LEG. All the risks, benefits and other treatment options have been discussed with the patient. The patient has consented to proceed with Procedure(s): AMPUTATION ABOVE KNEE as a surgical intervention.  Patty Leitzke 02/07/2019 4:17 PM [facility] Ve[facility]

## 2019-02-08 DIAGNOSIS — N401 Enlarged prostate with lower urinary tract symptoms: Secondary | ICD-10-CM

## 2019-02-08 DIAGNOSIS — E1122 Type 2 diabetes mellitus with diabetic chronic kidney disease: Secondary | ICD-10-CM

## 2019-02-08 DIAGNOSIS — N138 Other obstructive and reflux uropathy: Secondary | ICD-10-CM

## 2019-02-08 DIAGNOSIS — I480 Paroxysmal atrial fibrillation: Secondary | ICD-10-CM

## 2019-02-08 DIAGNOSIS — N183 Chronic kidney disease, stage 3 (moderate): Secondary | ICD-10-CM

## 2019-02-08 DIAGNOSIS — I998 Other disorder of circulatory system: Secondary | ICD-10-CM

## 2019-02-08 DIAGNOSIS — Z89612 Acquired absence of left leg above knee: Secondary | ICD-10-CM

## 2019-02-08 DIAGNOSIS — E785 Hyperlipidemia, unspecified: Secondary | ICD-10-CM

## 2019-02-08 LAB — BASIC METABOLIC PANEL
Anion gap: 13 (ref 5–15)
BUN: 20 mg/dL (ref 8–23)
CO2: 22 mmol/L (ref 22–32)
Calcium: 8.5 mg/dL — ABNORMAL LOW (ref 8.9–10.3)
Chloride: 94 mmol/L — ABNORMAL LOW (ref 98–111)
Creatinine, Ser: 1.22 mg/dL (ref 0.61–1.24)
GFR calc Af Amer: 58 mL/min — ABNORMAL LOW (ref >60)
GFR calc non Af Amer: 50 mL/min — ABNORMAL LOW (ref >60)
Glucose, Bld: 242 mg/dL — ABNORMAL HIGH (ref 70–99)
Potassium: 4.7 mmol/L (ref 3.5–5.1)
Sodium: 129 mmol/L — ABNORMAL LOW (ref 135–145)

## 2019-02-08 LAB — CBC
HCT: 33.4 % — ABNORMAL LOW (ref 39.0–52.0)
Hemoglobin: 11.6 g/dL — ABNORMAL LOW (ref 13.0–17.0)
MCH: 33.6 pg (ref 26.0–34.0)
MCHC: 34.7 g/dL (ref 30.0–36.0)
MCV: 96.8 fL (ref 80.0–100.0)
Platelets: 454 10*3/uL — ABNORMAL HIGH (ref 150–400)
RBC: 3.45 MIL/uL — ABNORMAL LOW (ref 4.22–5.81)
RDW: 12.7 % (ref 11.5–15.5)
WBC: 14.3 10*3/uL — ABNORMAL HIGH (ref 4.0–10.5)
nRBC: 0 % (ref 0.0–0.2)

## 2019-02-08 LAB — GLUCOSE, CAPILLARY
Glucose-Capillary: 199 mg/dL — ABNORMAL HIGH (ref 70–99)
Glucose-Capillary: 249 mg/dL — ABNORMAL HIGH (ref 70–99)
Glucose-Capillary: 153 mg/dL — ABNORMAL HIGH (ref 70–99)
Glucose-Capillary: 261 mg/dL — ABNORMAL HIGH (ref 70–99)

## 2019-02-08 MED ORDER — SIMVASTATIN 20 MG PO TABS
10.0000 mg | ORAL_TABLET | Freq: Every evening | ORAL | Status: DC
  Administered 2019-02-08 – 2019-02-10 (×3): 10 mg via ORAL
  Filled 2019-02-08 (×3): qty 1

## 2019-02-08 MED ORDER — GABAPENTIN 100 MG PO CAPS
100.0000 mg | ORAL_CAPSULE | Freq: Every day | ORAL | Status: DC
  Administered 2019-02-08 – 2019-02-09 (×2): 100 mg via ORAL
  Filled 2019-02-08 (×2): qty 1

## 2019-02-08 MED ORDER — METOPROLOL TARTRATE 25 MG PO TABS
25.0000 mg | ORAL_TABLET | Freq: Two times a day (BID) | ORAL | Status: DC
  Administered 2019-02-08 – 2019-02-10 (×4): 25 mg via ORAL
  Filled 2019-02-08 (×4): qty 1

## 2019-02-08 MED ORDER — FERROUS SULFATE 325 (65 FE) MG PO TABS
325.0000 mg | ORAL_TABLET | ORAL | Status: DC
  Administered 2019-02-10: 325 mg via ORAL
  Filled 2019-02-08: qty 1

## 2019-02-08 NOTE — Progress Notes (Addendum)
PROGRESS NOTE    Nathan Cook  YME:[id number] DOB: April 25, 1922 DOA: 02/07/2019 PCP: Virgie Dad, MD    Brief Narrative:  85 year old male who presented with right foot gangrene.  He does have significant past medical history for peripheral vascular disease, chronic kidney disease stage III, type 2 diabetes mellitus, dyslipidemia, hypertension and history of left above-the-knee amputation.  Patient was evaluated by vascular surgery, requiring right above-the-knee amputation.  Postoperative  physical examination his temperature was 97.2, blood pressure 140/85, heart rate 76, respiratory rate 22, oxygen saturation 100% on room air.  His lungs were clear to auscultation bilaterally, heart S1-S2 present, irregular, abdomen was soft nontender, lower extremities with above-the-knee amputation bilaterally, surgical wound on the right stump.  Patient was admitted to the hospital with a working diagnosis of right foot gangrene status post right above-the-knee amputation.   Assessment & Plan:   Principal Problem:   S/P AKA (above knee amputation) unilateral, right (HCC) Active Problems:   Hyperlipidemia LDL goal <70   Benign hypertension with chronic kidney disease, stage III (HCC)   Controlled type 2 diabetes mellitus with stage 3 chronic kidney disease, without long-term current use of insulin (HCC)   Brainstem infarct, acute (HCC) s/p IV tPA   BPH (benign prostatic hyperplasia)   History of TIA (transient ischemic attack)   Generalized osteoarthritis   PAD (peripheral artery disease) (HCC)   Paroxysmal atrial fibrillation (HCC)   Critical lower limb ischemia   S/P AKA (above knee amputation) unilateral, left ([place])   1. Right foot gangrene sp aka. Patient with pain at the surgical wound, continue pain control with morphine and wound care per surgery team.   2. Paroxysmal atrial fibrillation. Rate controlled, will continue telemetry monitoring. Will resume apixaban when ok per surgery  team. Will resume metoprolol.   3. T2DM. Continue glucose control with insulin sliding scale. Patient is tolerating po well. At home on metformin. Continue gabapentin for neuropathy.   4. Hx CVA. Continue neuro checks per unit protocol.   5. Acute urinary retention in the setting of BPH. Bladder scan with more than 600 ml of urine, will place foley catheter for now. Continue close monitoring.   6. HTN. Will continue to hold on losartan for now. Blood pressure systolic 680 mmHg.   7. Iron deficiency. Continue with iron supplements.   8. Hyponatremia. Patient tolerating po well, will hold on IV fluids and follow renal panel in am.   DVT prophylaxis: heparin   Code Status: full  Family Communication: no family at the bedside Disposition Plan/ discharge barriers: pending clinical improvement.   Body mass index is 18.2 kg/m. Malnutrition Type:      Malnutrition Characteristics:      Nutrition Interventions:     RN Pressure Injury Documentation: Pressure Injury Stage I -  Intact skin with non-blanchable redness of a localized area usually over a bony prominence. (Active)     Location: Sacrum  Location Orientation:   Staging: Stage I -  Intact skin with non-blanchable redness of a localized area usually over a bony prominence.  Wound Description (Comments):   Present on Admission: Yes     Consultants:   Vascular sugery  Procedures:   Right AKA  Antimicrobials:       Subjective: Patient is feeling well, but not yet back to baseline, continue to have pain on the surgical wound, controlled with analgesics. No nausea or vomiting.   Objective: Vitals:   02/08/19 0344 02/08/19 0812 02/08/19 1100 02/08/19 1129  BP: (!) 108/54 (!) 110/56 (!) 99/48 (!) 102/56  Pulse: 91 91 95 (!) 101  Resp: 19   16  Temp: (!) 97.5 F (36.4 C)   98 F (36.7 C)  TempSrc: Oral   Oral  SpO2: 95% 97% 92% 94%  Weight:      Height:        Intake/Output Summary (Last 24 hours) at  02/08/2019 1201 Last data filed at 02/08/2019 0900 Gross per 24 hour  Intake 2152.95 ml  Output 25 ml  Net 2127.95 ml   Filed Weights   02/07/19 1238 02/07/19 2019  Weight: 57.6 kg 54.3 kg    Examination:   General: deconditioned.  Neurology: Awake and alert, non focal  E ZJI:RCVE  pallor, no icterus, oral mucosa moist Cardiovascular: No JVD. S1-S2 present, rhythmic, no gallops, rubs, or murmurs. Pulmonary: positive breath sounds bilaterally, adequate air movement, no wheezing, rhonchi or rales. Gastrointestinal. Abdomen with no organomegaly, non tender, no rebound or guarding Skin. No rashes Musculoskeletal: bilateral BKA, right stump with dressing in place.      Data Reviewed: I have personally reviewed following labs and imaging studies  CBC: Recent Labs  Lab 02/07/19 1233 02/08/19 0245  WBC 9.7 14.3*  HGB 11.8* 11.6*  HCT 34.4* 33.4*  MCV 97.5 96.8  PLT 448* 938*   Basic Metabolic Panel: Recent Labs  Lab 02/07/19 1233 02/08/19 0245  NA 127* 129*  K 5.1 4.7  CL 92* 94*  CO2 25 22  GLUCOSE 124* 242*  BUN 23 20  CREATININE 1.14 1.22  CALCIUM 8.7* 8.5*   GFR: Estimated Creatinine Clearance: 28.4 mL/min (by C-G formula based on SCr of 1.22 mg/dL). Liver Function Tests: No results for input(s): AST, ALT, ALKPHOS, BILITOT, PROT, ALBUMIN in the last 168 hours. No results for input(s): LIPASE, AMYLASE in the last 168 hours. No results for input(s): AMMONIA in the last 168 hours. Coagulation Profile: Recent Labs  Lab 02/07/19 1233  INR 1.0   Cardiac Enzymes: No results for input(s): CKTOTAL, CKMB, CKMBINDEX, TROPONINI in the last 168 hours. BNP (last 3 results) No results for input(s): PROBNP in the last 8760 hours. HbA1C: No results for input(s): HGBA1C in the last 72 hours. CBG: Recent Labs  Lab 02/07/19 1437 02/07/19 1818 02/07/19 2130 02/08/19 0631 02/08/19 1125  GLUCAP 125* 171* 192* 199* 249*   Lipid Profile: No results for input(s):  CHOL, HDL, LDLCALC, TRIG, CHOLHDL, LDLDIRECT in the last 72 hours. Thyroid Function Tests: No results for input(s): TSH, T4TOTAL, FREET4, T3FREE, THYROIDAB in the last 72 hours. Anemia Panel: No results for input(s): VITAMINB12, FOLATE, FERRITIN, TIBC, IRON, RETICCTPCT in the last 72 hours.    Radiology Studies: I have reviewed all of the imaging during this hospital visit personally     Scheduled Meds: . heparin injection (subcutaneous)  5,000 Units Subcutaneous Q8H  . insulin aspart  0-5 Units Subcutaneous QHS  . insulin aspart  0-9 Units Subcutaneous TID WC   Continuous Infusions: . sodium chloride 75 mL/hr at 02/08/19 1101     LOS: 1 day        Ivadell Gaul Gerome Apley, MD

## 2019-02-08 NOTE — Progress Notes (Signed)
[facility]   Subjective  - some leg soreness   Objective (!) 102/56 (!) 101 98 F (36.7 C) (Oral) 16 94%  Intake/Output Summary (Last 24 hours) at 02/08/2019 1204 Last data filed at 02/08/2019 0900 Gross per 24 hour  Intake 2152.95 ml  Output 25 ml  Net 2127.95 ml   AKA dressing right leg dry  Assessment/Planning: POD #1 AKA Change dressing tomorrow  Ruta Hinds 02/08/2019 12:04 PM --  Laboratory Lab Results: Recent Labs    02/07/19 1233 02/08/19 0245  WBC 9.7 14.3*  HGB 11.8* 11.6*  HCT 34.4* 33.4*  PLT 448* 454*   BMET Recent Labs    02/07/19 1233 02/08/19 0245  NA 127* 129*  K 5.1 4.7  CL 92* 94*  CO2 25 22  GLUCOSE 124* 242*  BUN 23 20  CREATININE 1.14 1.22  CALCIUM 8.7* 8.5*    COAG Lab Results  Component Value Date   INR 1.0 02/07/2019   INR 1.2 10/11/2018   INR 1.06 04/01/2018   No results found for: PTT

## 2019-02-08 NOTE — Progress Notes (Signed)
Patient with >644 in bladder per bladder scan, Dr. Cathlean Sauer made aware through [facility] system, with call back. Orders received. Will monitor patient. Latif Nazareno, Bettina Gavia RN

## 2019-02-09 ENCOUNTER — Encounter (HOSPITAL_COMMUNITY): Payer: Self-pay | Admitting: Vascular Surgery

## 2019-02-09 DIAGNOSIS — Z8673 Personal history of transient ischemic attack (TIA), and cerebral infarction without residual deficits: Secondary | ICD-10-CM

## 2019-02-09 DIAGNOSIS — I129 Hypertensive chronic kidney disease with stage 1 through stage 4 chronic kidney disease, or unspecified chronic kidney disease: Secondary | ICD-10-CM

## 2019-02-09 LAB — GLUCOSE, CAPILLARY
Glucose-Capillary: 122 mg/dL — ABNORMAL HIGH (ref 70–99)
Glucose-Capillary: 181 mg/dL — ABNORMAL HIGH (ref 70–99)
Glucose-Capillary: 127 mg/dL — ABNORMAL HIGH (ref 70–99)
Glucose-Capillary: 186 mg/dL — ABNORMAL HIGH (ref 70–99)

## 2019-02-09 LAB — CBC WITH DIFFERENTIAL/PLATELET
Abs Immature Granulocytes: 0.07 10*3/uL (ref 0.00–0.07)
Basophils Absolute: 0 10*3/uL (ref 0.0–0.1)
Basophils Relative: 0 %
Eosinophils Absolute: 0.1 10*3/uL (ref 0.0–0.5)
Eosinophils Relative: 1 %
HCT: 27.5 % — ABNORMAL LOW (ref 39.0–52.0)
Hemoglobin: 9.5 g/dL — ABNORMAL LOW (ref 13.0–17.0)
Immature Granulocytes: 1 %
Lymphocytes Relative: 12 %
Lymphs Abs: 1.8 10*3/uL (ref 0.7–4.0)
MCH: 33.5 pg (ref 26.0–34.0)
MCHC: 34.5 g/dL (ref 30.0–36.0)
MCV: 96.8 fL (ref 80.0–100.0)
Monocytes Absolute: 1.7 10*3/uL — ABNORMAL HIGH (ref 0.1–1.0)
Monocytes Relative: 12 %
Neutro Abs: 10.9 10*3/uL — ABNORMAL HIGH (ref 1.7–7.7)
Neutrophils Relative %: 74 %
Platelets: 459 10*3/uL — ABNORMAL HIGH (ref 150–400)
RBC: 2.84 MIL/uL — ABNORMAL LOW (ref 4.22–5.81)
RDW: 13.1 % (ref 11.5–15.5)
WBC: 14.5 10*3/uL — ABNORMAL HIGH (ref 4.0–10.5)
nRBC: 0 % (ref 0.0–0.2)

## 2019-02-09 LAB — BASIC METABOLIC PANEL
Anion gap: 8 (ref 5–15)
BUN: 27 mg/dL — ABNORMAL HIGH (ref 8–23)
CO2: 23 mmol/L (ref 22–32)
Calcium: 8.2 mg/dL — ABNORMAL LOW (ref 8.9–10.3)
Chloride: 98 mmol/L (ref 98–111)
Creatinine, Ser: 1.21 mg/dL (ref 0.61–1.24)
GFR calc Af Amer: 59 mL/min — ABNORMAL LOW (ref >60)
GFR calc non Af Amer: 51 mL/min — ABNORMAL LOW (ref >60)
Glucose, Bld: 113 mg/dL — ABNORMAL HIGH (ref 70–99)
Potassium: 4.4 mmol/L (ref 3.5–5.1)
Sodium: 129 mmol/L — ABNORMAL LOW (ref 135–145)

## 2019-02-09 MED ORDER — TAMSULOSIN HCL 0.4 MG PO CAPS
0.4000 mg | ORAL_CAPSULE | Freq: Every day | ORAL | Status: DC
  Administered 2019-02-09 – 2019-02-10 (×2): 0.4 mg via ORAL
  Filled 2019-02-09 (×2): qty 1

## 2019-02-09 NOTE — Anesthesia Postprocedure Evaluation (Signed)
Anesthesia Post Note  Patient: Nathan Cook  Procedure(s) Performed: AMPUTATION ABOVE KNEE (Right )     Patient location during evaluation: Other Anesthesia Type: General Level of consciousness: awake and alert Pain management: pain level controlled Vital Signs Assessment: post-procedure vital signs reviewed and stable Respiratory status: spontaneous breathing, nonlabored ventilation and respiratory function stable Cardiovascular status: blood pressure returned to baseline and stable Postop Assessment: no apparent nausea or vomiting Anesthetic complications: no    Last Vitals:  Vitals:   02/08/19 1807 02/08/19 2038  BP: (!) 107/57 (!) 114/50  Pulse: 89 92  Resp:  15  Temp:  (!) 36.3 C  SpO2: 95% 95%    Last Pain:  Vitals:   02/08/19 2140  TempSrc:   PainSc: Asleep                 Squire Withey,W. EDMOND

## 2019-02-09 NOTE — Progress Notes (Signed)
PROGRESS NOTE    Nathan Cook  DGL:[id number] DOB: 1921-08-17 DOA: 02/07/2019 PCP: Virgie Dad, MD    Brief Narrative:  85 year old male who presented with right foot gangrene.  He does have significant past medical history for peripheral vascular disease, chronic kidney disease stage III, type 2 diabetes mellitus, dyslipidemia, hypertension and history of left above-the-knee amputation.  Patient was evaluated by vascular surgery, requiring right above-the-knee amputation.  Postoperative  physical examination his temperature was 97.2, blood pressure 140/85, heart rate 76, respiratory rate 22, oxygen saturation 100% on room air.  His lungs were clear to auscultation bilaterally, heart S1-S2 present, irregular, abdomen was soft nontender, lower extremities with above-the-knee amputation bilaterally, surgical wound on the right stump.  Patient was admitted to the hospital with a working diagnosis of right foot gangrene status post right above-the-knee amputation.   Assessment & Plan:   Principal Problem:   S/P AKA (above knee amputation) unilateral, right (HCC) Active Problems:   Hyperlipidemia LDL goal <70   Benign hypertension with chronic kidney disease, stage III (HCC)   Controlled type 2 diabetes mellitus with stage 3 chronic kidney disease, without long-term current use of insulin (HCC)   Brainstem infarct, acute (HCC) s/p IV tPA   BPH (benign prostatic hyperplasia)   History of TIA (transient ischemic attack)   Generalized osteoarthritis   PAD (peripheral artery disease) (HCC)   Paroxysmal atrial fibrillation (HCC)   Critical lower limb ischemia   S/P AKA (above knee amputation) unilateral, left ([place])   1. Right foot gangrene sp aka. Surgical pain is well controlled, continue with as needed morphine. Continue local wound care. Patient will need SNF at discharge, follow with surgery as out patient.   2. Paroxysmal atrial fibrillation. Will resume apixaban in am,  continue rate control with metoprolol.   3. T2DM. On insulin sliding scale for glucose cover and monitoring. Patient is tolerating po well.  4. Hx CVA. Stable, patient will need snf at discharge.    5. Acute urinary retention in the setting of BPH. Patient with urinary retention yesterday, will remove catheter today in preparation for discharge in am, continue bladder monitoring.   6. HTN. Continue to hold on losartan for now, blood pressure systolic 518 mmHG.  7. Iron deficiency. On iron supplements.    8. Hyponatremia. Serum Na today at 129, he is tolerating po well, will follow on renal panel in am.  9. Stage I pressure ulcer at the sacrum, present on admission. Continue with local wound care.   DVT prophylaxis: heparin   Code Status: full  Family Communication: no family at the bedside Disposition Plan/ discharge barriers: plan for dc in am to [facility].     Body mass index is 18.2 kg/m. Malnutrition Type:      Malnutrition Characteristics:      Nutrition Interventions:     RN Pressure Injury Documentation: Pressure Injury Stage I -  Intact skin with non-blanchable redness of a localized area usually over a bony prominence. (Active)     Location: Sacrum  Location Orientation:   Staging: Stage I -  Intact skin with non-blanchable redness of a localized area usually over a bony prominence.  Wound Description (Comments):   Present on Admission: Yes     Consultants:   Vascular surgery  Procedures:   Right aka  Antimicrobials:       Subjective: Patient is feeling better, pain on his right stump is controlled, no chest pain or dyspnea, no nausea or vomiting.  Objective: Vitals:   02/08/19 2038 02/09/19 0609 02/09/19 0855 02/09/19 1101  BP: (!) 114/50 (!) 145/59 125/74 (!) 110/49  Pulse: 92 69 83 67  Resp: 15 16    Temp: (!) 97.4 F (36.3 C) 98.1 F (36.7 C)  98.1 F (36.7 C)  TempSrc: Oral Oral  Oral  SpO2: 95% 95% 93% 97%  Weight:       Height:        Intake/Output Summary (Last 24 hours) at 02/09/2019 1352 Last data filed at 02/09/2019 1300 Gross per 24 hour  Intake 1249.72 ml  Output 1800 ml  Net -550.28 ml   Filed Weights   02/07/19 1238 02/07/19 2019  Weight: 57.6 kg 54.3 kg    Examination:   General: Not in pain or dyspnea, deconditioned  Neurology: Awake and alert, non focal  E ENT: mild pallor, no icterus, oral mucosa moist Cardiovascular: No JVD. S1-S2 present, rhythmic, no gallops, rubs, or murmurs. No lower extremity edema. Pulmonary: positive breath sounds bilaterally, adequate air movement, no wheezing, rhonchi or rales. Gastrointestinal. Abdomen with no organomegaly, non tender, no rebound or guarding Skin. No rashes Musculoskeletal: bilateral AKA     Data Reviewed: I have personally reviewed following labs and imaging studies  CBC: Recent Labs  Lab 02/07/19 1233 02/08/19 0245 02/09/19 0311  WBC 9.7 14.3* 14.5*  NEUTROABS  --   --  10.9*  HGB 11.8* 11.6* 9.5*  HCT 34.4* 33.4* 27.5*  MCV 97.5 96.8 96.8  PLT 448* 454* 341*   Basic Metabolic Panel: Recent Labs  Lab 02/07/19 1233 02/08/19 0245 02/09/19 0311  NA 127* 129* 129*  K 5.1 4.7 4.4  CL 92* 94* 98  CO2 25 22 23   GLUCOSE 124* 242* 113*  BUN 23 20 27*  CREATININE 1.14 1.22 1.21  CALCIUM 8.7* 8.5* 8.2*   GFR: Estimated Creatinine Clearance: 28.7 mL/min (by C-G formula based on SCr of 1.21 mg/dL). Liver Function Tests: No results for input(s): AST, ALT, ALKPHOS, BILITOT, PROT, ALBUMIN in the last 168 hours. No results for input(s): LIPASE, AMYLASE in the last 168 hours. No results for input(s): AMMONIA in the last 168 hours. Coagulation Profile: Recent Labs  Lab 02/07/19 1233  INR 1.0   Cardiac Enzymes: No results for input(s): CKTOTAL, CKMB, CKMBINDEX, TROPONINI in the last 168 hours. BNP (last 3 results) No results for input(s): PROBNP in the last 8760 hours. HbA1C: No results for input(s): HGBA1C in the last  72 hours. CBG: Recent Labs  Lab 02/08/19 1125 02/08/19 1627 02/08/19 2126 02/09/19 0611 02/09/19 1116  GLUCAP 249* 153* 261* 122* 181*   Lipid Profile: No results for input(s): CHOL, HDL, LDLCALC, TRIG, CHOLHDL, LDLDIRECT in the last 72 hours. Thyroid Function Tests: No results for input(s): TSH, T4TOTAL, FREET4, T3FREE, THYROIDAB in the last 72 hours. Anemia Panel: No results for input(s): VITAMINB12, FOLATE, FERRITIN, TIBC, IRON, RETICCTPCT in the last 72 hours.    Radiology Studies: I have reviewed all of the imaging during this hospital visit personally     Scheduled Meds: . [START ON 02/10/2019] ferrous sulfate  325 mg Oral Q M,W,F  . gabapentin  100 mg Oral QHS  . heparin injection (subcutaneous)  5,000 Units Subcutaneous Q8H  . insulin aspart  0-5 Units Subcutaneous QHS  . insulin aspart  0-9 Units Subcutaneous TID WC  . metoprolol tartrate  25 mg Oral BID  . simvastatin  10 mg Oral QPM   Continuous Infusions:   LOS: 2 days  Raynie Steinhaus Gerome Apley, MD

## 2019-02-09 NOTE — Progress Notes (Signed)
[facility]  Subjective  - less pain   Objective 125/74 83 98.1 F (36.7 C) (Oral) 16 93%  Intake/Output Summary (Last 24 hours) at 02/09/2019 1052 Last data filed at 02/09/2019 0930 Gross per 24 hour  Intake 1129.72 ml  Output 1800 ml  Net -670.28 ml   Right aka healing well  Assessment/Planning: Ok for d/c from our standpoint Needs follow up in 1 month  Ruta Hinds 02/09/2019 10:52 AM --  Laboratory Lab Results: Recent Labs    02/08/19 0245 02/09/19 0311  WBC 14.3* 14.5*  HGB 11.6* 9.5*  HCT 33.4* 27.5*  PLT 454* 459*   BMET Recent Labs    02/08/19 0245 02/09/19 0311  NA 129* 129*  K 4.7 4.4  CL 94* 98  CO2 22 23  GLUCOSE 242* 113*  BUN 20 27*  CREATININE 1.22 1.21  CALCIUM 8.5* 8.2*    COAG Lab Results  Component Value Date   INR 1.0 02/07/2019   INR 1.2 10/11/2018   INR 1.06 04/01/2018   No results found for: PTT

## 2019-02-09 NOTE — Progress Notes (Signed)
Patient up to chair x1. Rayfield Beem, Bettina Gavia rN

## 2019-02-10 DIAGNOSIS — I6389 Other cerebral infarction: Secondary | ICD-10-CM

## 2019-02-10 LAB — BASIC METABOLIC PANEL
Anion gap: 8 (ref 5–15)
BUN: 21 mg/dL (ref 8–23)
CO2: 25 mmol/L (ref 22–32)
Calcium: 8.1 mg/dL — ABNORMAL LOW (ref 8.9–10.3)
Chloride: 95 mmol/L — ABNORMAL LOW (ref 98–111)
Creatinine, Ser: 0.98 mg/dL (ref 0.61–1.24)
GFR calc Af Amer: >60 mL/min (ref >60)
GFR calc non Af Amer: >60 mL/min (ref >60)
Glucose, Bld: 176 mg/dL — ABNORMAL HIGH (ref 70–99)
Potassium: 4.5 mmol/L (ref 3.5–5.1)
Sodium: 128 mmol/L — ABNORMAL LOW (ref 135–145)

## 2019-02-10 LAB — GLUCOSE, CAPILLARY
Glucose-Capillary: 180 mg/dL — ABNORMAL HIGH (ref 70–99)
Glucose-Capillary: 138 mg/dL — ABNORMAL HIGH (ref 70–99)
Glucose-Capillary: 169 mg/dL — ABNORMAL HIGH (ref 70–99)

## 2019-02-10 MED ORDER — TAMSULOSIN HCL 0.4 MG PO CAPS: 0.4000 mg | ORAL_CAPSULE | Freq: Every day | ORAL | 0 refills | Status: DC

## 2019-02-10 MED ORDER — OXYCODONE-ACETAMINOPHEN 5-325 MG PO TABS: 1.0000 | ORAL_TABLET | Freq: Four times a day (QID) | ORAL | 0 refills | Status: DC | PRN

## 2019-02-10 NOTE — TOC Transition Note (Addendum)
Transition of Care [facility]) - CM/SW Discharge Note   Patient Details  Name: Nathan Cook MRN: [id number] Date of Birth: 11-17-1921  Transition of Care [facility]) CM/SW Contact:  Vinie Sill, [place] Phone Number: 02/10/2019, 5:03 PM   Clinical Narrative:     Patient will DC to: [place] Date:02/10/2019 Family Notified:[place], daughter- CSW Left voice mail -  CSW had informed her earlier today  the patient would probably d/c today  Transport By: PTAR @ 5:30pm  RN, patient, and facility notified of DC. Discharge Summary sent to facility. RN given number for report [phone] Ext [phone] or [phone] [facility] ask for patient's nurse . If any problems the on call RN [phone].  Ambulance transport requested for patient.   Clinical Social Worker signing off. Thurmond Butts, MSW, [facility] Clinical Social Worker [phone]     Final next level of care: Assisted Living     Patient Goals and CMS Choice Patient states their goals for this hospitalization and ongoing recovery are:: retrun to ALF      Discharge Placement PASRR number recieved: 02/10/19            Patient chooses bed at: ([place]) Patient to be transferred to facility by: [place] Name of family member notified: [place], daughter- CSW left voice message Patient and family notified of of transfer: 02/10/19  Discharge Plan and Services In-house Referral: Clinical Social Work                                   Social Determinants of Health (SDOH) Interventions     Readmission Risk Interventions No flowsheet data found.

## 2019-02-10 NOTE — Progress Notes (Signed)
Orthopedic Tech Progress Note Patient Details:  Nathan Cook 1922/02/15 [id number] Called in order to [facility] for an AKA SOCK for patient Patient ID: [name], male   DOB: August 21, 1921, 85 y.o.   MRN: [id number]   Janit Pagan 02/10/2019, 8:33 AM

## 2019-02-10 NOTE — Progress Notes (Addendum)
  Progress Note    02/10/2019 7:37 AM 3 Days Post-Op  Subjective:  Soreness R AKA incision but now better controlled   Vitals:   02/09/19 2022 02/10/19 0438  BP: (!) 114/51 (!) 120/52  Pulse: 81 68  Resp: 16 16  Temp: (!) 97.5 F (36.4 C) 98.2 F (36.8 C)  SpO2: 96% 96%   Physical Exam: Lungs:  Non labored Incisions:  R AKA c/d/i Neurologic: A&O  CBC    Component Value Date/Time   WBC 14.5 (H) 02/09/2019 0311   RBC 2.84 (L) 02/09/2019 0311   HGB 9.5 (L) 02/09/2019 0311   HCT 27.5 (L) 02/09/2019 0311   PLT 459 (H) 02/09/2019 0311   MCV 96.8 02/09/2019 0311   MCH 33.5 02/09/2019 0311   MCHC 34.5 02/09/2019 0311   RDW 13.1 02/09/2019 0311   LYMPHSABS 1.8 02/09/2019 0311   MONOABS 1.7 (H) 02/09/2019 0311   EOSABS 0.1 02/09/2019 0311   BASOSABS 0.0 02/09/2019 0311    BMET    Component Value Date/Time   NA 128 (L) 02/10/2019 0402   NA 127 (A) 01/23/2019   K 4.5 02/10/2019 0402   CL 95 (L) 02/10/2019 0402   CL 103 10/17/2018   CO2 25 02/10/2019 0402   CO2 29 10/17/2018   GLUCOSE 176 (H) 02/10/2019 0402   BUN 21 02/10/2019 0402   BUN 23 (A) 01/23/2019   CREATININE 0.98 02/10/2019 0402   CREATININE 1.44 (H) 08/29/2018 0815   CALCIUM 8.1 (L) 02/10/2019 0402   CALCIUM 8.0 10/17/2018   GFRNONAA >60 02/10/2019 0402   GFRNONAA 57 10/17/2018   GFRNONAA 41 (L) 08/29/2018 0815   GFRAA >60 02/10/2019 0402   GFRAA 48 (L) 08/29/2018 0815    INR    Component Value Date/Time   INR 1.0 02/07/2019 1233     Intake/Output Summary (Last 24 hours) at 02/10/2019 0737 Last data filed at 02/10/2019 0600 Gross per 24 hour  Intake 360 ml  Output 1225 ml  Net -865 ml     Assessment/Plan:  85 y.o. male is s/p R AKA 3 Days Post-Op  Incision healing well; stump sock ordered Ok for discharge from vascular standpoint Staples out in 4 weeks; office will call to arrange   Dagoberto Ligas, PA-C [facility] [phone] 02/10/2019 7:37 AM  I have  examined the patient, reviewed and agree with above.  Curt Jews, MD 02/10/2019 12:15 PM

## 2019-02-10 NOTE — Discharge Summary (Signed)
Physician Discharge Summary  Nathan Cook HKV:[id number] DOB: 1921/09/21 DOA: 02/07/2019  PCP: Virgie Dad, MD  Admit date: 02/07/2019 Discharge date: 02/10/2019  Admitted From: Assisted living Disposition:  snf   Recommendations for Outpatient Follow-up and new medication changes:  1. Follow up with Dr. Lyndel Safe in 7 days.  2. Staples out in 4 weeks, per vascular surgery. 3. Follow up with vascular surgery.   4. Holding K supplements for now to prevent hyperkalemia.   Home Health: na   Equipment/Devices: na    Discharge Condition: stable  CODE STATUS: full  Diet recommendation: heart healthy   Brief/Interim Summary: 85 year old male who presented withright foot gangrene.He does have significant past medical history for peripheral vascular disease, chronic kidney disease stage III, type 2 diabetes mellitus, dyslipidemia, hypertension and history of left above-the-knee amputation.Patient was evaluated by vascular surgery, requiring right above-the-knee amputation. Postoperative physical examination his temperature was 97.2, blood pressure 140/85, heart rate 76, respiratory rate 22, oxygen saturation 100% on room air. His lungs were clear to auscultation bilaterally, heart S1-S2 present, irregular, abdomen was soft nontender, lower extremities with above-the-knee amputation bilaterally, surgical wound on the right stump. Sodium 129, potassium 5.1, chloride 92, bicarb 25, glucose 124, BUN 23, creatinine 1.14.  White count 9.7, hemoglobin 9.8, hematocrit 34.4, platelets 448.  SARS COVID 19 was negative.  Patient was admitted to the hospital with a working diagnosis of right foot gangrene status post right above-the-knee amputation.  1.  Right foot gangrene status post above-knee amputation.  Patient was immediately taken to the operation room, he underwent right above-the-knee amputation with no major complications.  His pain has been well controlled with oral analgesics.  His  incision is healing well, patient will get a stump stock before discharge.  The staples will be removed in 4 weeks per vascular surgery office.  Patient will be discharged to skilled nursing facility for physical therapy.  2.  Paroxysmal atrial fibrillation.  Patient remained rate controlled with metoprolol, perioperatively apixaban was held, at discharge anticoagulation will be resumed.  3.  Type 2 diabetes mellitus.  Patient was placed on insulin sliding scale for glucose monitoring.  4.  History of CVA.  Patient will follow-up with physical therapy at a skilled nursing facility.  Continue apixaban for anticoagulation.  5.  Acute urinary retention in setting of BPH.  Patient required indwelling Foley catheter for urinary retention.  He was placed on Tamsulosin and Foley catheter was removed, with no further signs of urinary retention.  6.  Hypertension.  Losartan was held during his hospitalization but will be resumed at discharge.  7.  Iron deficiency anemia patient's hemoglobin and hematocrit remained stable, continue iron supplements.  8. Chronic hyponatremia.  Serum sodium remained stable around 129 mEq.  Follow-up as an outpatient.  Preserved kidney function with a serum creatinine of 0.98, and serum bicarbonate of 25.  9.  Stage I pressure ulcer at the sacrum, present on admission.  Continue physical therapy, out of bed as tolerated, local wound care.   Discharge Diagnoses:  Principal Problem:   S/P AKA (above knee amputation) unilateral, right (HCC) Active Problems:   Hyperlipidemia LDL goal <70   Benign hypertension with chronic kidney disease, stage III (HCC)   Controlled type 2 diabetes mellitus with stage 3 chronic kidney disease, without long-term current use of insulin (HCC)   Brainstem infarct, acute (HCC) s/p IV tPA   BPH (benign prostatic hyperplasia)   History of TIA (transient ischemic attack)   Generalized  osteoarthritis   PAD (peripheral artery disease) (HCC)    Paroxysmal atrial fibrillation (HCC)   Critical lower limb ischemia   S/P AKA (above knee amputation) unilateral, left [facility])    Discharge Instructions   Allergies as of 02/10/2019      Reactions   Tape Other (See Comments)   TAPE WILL TEAR THE SKIN!!!!      Medication List    STOP taking these medications   amoxicillin-clavulanate 500-125 MG tablet Commonly known as: AUGMENTIN   potassium chloride 10 MEQ tablet Commonly known as: K-DUR     TAKE these medications   acetaminophen 650 MG CR tablet Commonly known as: Tylenol 8 Hour Take 1 tablet (650 mg total) by mouth every 8 (eight) hours as needed for pain.   Boost Glucose Control Liqd Take 237 mLs by mouth 2 (two) times daily between meals. Twice a day between meals   Calcium Carbonate-Vitamin D 600-400 MG-UNIT tablet Take 1 tablet by mouth daily.   Cholecalciferol 25 MCG (1000 UT) tablet Take 1,000 Units by mouth daily.   Eliquis 2.5 MG Tabs tablet Generic drug: apixaban Take 2.5 mg by mouth 2 (two) times daily.   feeding supplement (PRO-STAT SUGAR FREE 64) Liqd Take 30 mLs by mouth daily.   ferrous sulfate 325 (65 FE) MG tablet Take 325 mg by mouth every Monday, Wednesday, and Friday.   gabapentin 100 MG capsule Commonly known as: NEURONTIN TAKE ONE CAPSULE BY MOUTH EVERY NIGHT AT BEDTIME   losartan 25 MG tablet Commonly known as: COZAAR Take 25 mg by mouth daily.   metFORMIN 500 MG tablet Commonly known as: GLUCOPHAGE Take 1 tablet (500 mg total) by mouth 2 (two) times daily with a meal.   metoprolol tartrate 25 MG tablet Commonly known as: LOPRESSOR Take 1 tablet (25 mg total) by mouth 2 (two) times daily.   oxyCODONE-acetaminophen 5-325 MG tablet Commonly known as: PERCOCET/ROXICET Take 1 tablet by mouth every 6 (six) hours as needed for severe pain.   Propylene Glycol-Glycerin 1-0.3 % Soln Place 2 drops into both eyes daily as needed (dry eyes).   saccharomyces boulardii 250 MG  capsule Commonly known as: FLORASTOR Take 250 mg by mouth 2 (two) times daily.   simvastatin 10 MG tablet Commonly known as: ZOCOR TAKE ONE TABLET BY MOUTH DAILY What changed: when to take this   tamsulosin 0.4 MG Caps capsule Commonly known as: FLOMAX Take 1 capsule (0.4 mg total) by mouth daily after supper.   zinc oxide 20 % ointment Apply 1 application topically as needed for irritation. Apply to buttocks after every incontinent episode and as needed for redness      Follow-up Information    Early, Arvilla Meres, MD Follow up in 4 week(s).   Specialties: Vascular Surgery, Cardiology Contact information: [address] [place] [address] [phone]          Allergies  Allergen Reactions  . Tape Other (See Comments)    TAPE WILL TEAR THE SKIN!!!!    Consultations:  Vascular surgery   Procedures/Studies:  No results found.   Procedures: Right AKA   Subjective: Patient is feeling well, his pain is well controlled, no dyspnea, and no chest pain, no nausea or vomiting.   Discharge Exam: Vitals:   02/10/19 0438 02/10/19 0833  BP: (!) 120/52 102/80  Pulse: 68 79  Resp: 16   Temp: 98.2 F (36.8 C) 97.6 F (36.4 C)  SpO2: 96% 94%   Vitals:   02/09/19 1101 02/09/19  2022 02/10/19 0438 02/10/19 0833  BP: (!) 110/49 (!) 114/51 (!) 120/52 102/80  Pulse: 67 81 68 79  Resp:  16 16   Temp: 98.1 F (36.7 C) (!) 97.5 F (36.4 C) 98.2 F (36.8 C) 97.6 F (36.4 C)  TempSrc: Oral Oral Oral Oral  SpO2: 97% 96% 96% 94%  Weight:      Height:        General: Not in pain or dyspnea  Neurology: Awake and alert, non focal  E ENT: mild pallor, no icterus, oral mucosa moist Cardiovascular: No JVD. S1-S2 present, rhythmic, no gallops, rubs, or murmurs. No lower extremity edema. Pulmonary: psotive breath sounds bilaterally, adequate air movement, no wheezing, rhonchi or rales. Gastrointestinal. Abdomen with no organomegaly, non tender, no rebound or guarding Skin. No  rashes Musculoskeletal: Bilateral AKA.    The results of significant diagnostics from this hospitalization (including imaging, microbiology, ancillary and laboratory) are listed below for reference.     Microbiology: Recent Results (from the past 240 hour(s))  SARS Coronavirus 2 [facility] order, Performed in [facility] hospital lab) Nasopharyngeal Nasopharyngeal Swab     Status: None   Collection Time: 02/07/19 12:31 PM   Specimen: Nasopharyngeal Swab  Result Value Ref Range Status   SARS Coronavirus 2 NEGATIVE NEGATIVE Final    Comment: (NOTE) If result is NEGATIVE SARS-CoV-2 target nucleic acids are NOT DETECTED. The SARS-CoV-2 RNA is generally detectable in upper and lower  respiratory specimens during the acute phase of infection. The lowest  concentration of SARS-CoV-2 viral copies this assay can detect is 250  copies / mL. A negative result does not preclude SARS-CoV-2 infection  and should not be used as the sole basis for treatment or other  patient management decisions.  A negative result may occur with  improper specimen collection / handling, submission of specimen other  than nasopharyngeal swab, presence of viral mutation(s) within the  areas targeted by this assay, and inadequate number of viral copies  (<250 copies / mL). A negative result must be combined with clinical  observations, patient history, and epidemiological information. If result is POSITIVE SARS-CoV-2 target nucleic acids are DETECTED. The SARS-CoV-2 RNA is generally detectable in upper and lower  respiratory specimens dur ing the acute phase of infection.  Positive  results are indicative of active infection with SARS-CoV-2.  Clinical  correlation with patient history and other diagnostic information is  necessary to determine patient infection status.  Positive results do  not rule out bacterial infection or co-infection with other viruses. If result is PRESUMPTIVE POSTIVE SARS-CoV-2 nucleic  acids MAY BE PRESENT.   A presumptive positive result was obtained on the submitted specimen  and confirmed on repeat testing.  While 2019 novel coronavirus  (SARS-CoV-2) nucleic acids may be present in the submitted sample  additional confirmatory testing may be necessary for epidemiological  and / or clinical management purposes  to differentiate between  SARS-CoV-2 and other Sarbecovirus currently known to infect humans.  If clinically indicated additional testing with an alternate test  methodology [phone]) is advised. The SARS-CoV-2 RNA is generally  detectable in upper and lower respiratory sp ecimens during the acute  phase of infection. The expected result is Negative. Fact Sheet for Patients:  StrictlyIdeas.no Fact Sheet for Healthcare Providers: BankingDealers.co.za This test is not yet approved or cleared by the [place] FDA and has been authorized for detection and/or diagnosis of SARS-CoV-2 by FDA under an Emergency Use Authorization (EUA).  This EUA will remain  in effect (meaning this test can be used) for the duration of the COVID-19 declaration under Section 564(b)(1) of the Act, 21 U.S.C. section 360bbb-3(b)(1), unless the authorization is terminated or revoked sooner. Performed at [facility], [place] [address], [place], [place] [address]      Labs: BNP (last 3 results) No results for input(s): BNP in the last 8760 hours. Basic Metabolic Panel: Recent Labs  Lab 02/07/19 1233 02/08/19 0245 02/09/19 0311 02/10/19 0402  NA 127* 129* 129* 128*  K 5.1 4.7 4.4 4.5  CL 92* 94* 98 95*  CO2 25 22 23 25   GLUCOSE 124* 242* 113* 176*  BUN 23 20 27* 21  CREATININE 1.14 1.22 1.21 0.98  CALCIUM 8.7* 8.5* 8.2* 8.1*   Liver Function Tests: No results for input(s): AST, ALT, ALKPHOS, BILITOT, PROT, ALBUMIN in the last 168 hours. No results for input(s): LIPASE, AMYLASE in the last 168 hours. No results for  input(s): AMMONIA in the last 168 hours. CBC: Recent Labs  Lab 02/07/19 1233 02/08/19 0245 02/09/19 0311  WBC 9.7 14.3* 14.5*  NEUTROABS  --   --  10.9*  HGB 11.8* 11.6* 9.5*  HCT 34.4* 33.4* 27.5*  MCV 97.5 96.8 96.8  PLT 448* 454* 459*   Cardiac Enzymes: No results for input(s): CKTOTAL, CKMB, CKMBINDEX, TROPONINI in the last 168 hours. BNP: Invalid input(s): POCBNP CBG: Recent Labs  Lab 02/09/19 0611 02/09/19 1116 02/09/19 1631 02/09/19 2154 02/10/19 0607  GLUCAP 122* 181* 127* 186* 180*   D-Dimer No results for input(s): DDIMER in the last 72 hours. Hgb A1c No results for input(s): HGBA1C in the last 72 hours. Lipid Profile No results for input(s): CHOL, HDL, LDLCALC, TRIG, CHOLHDL, LDLDIRECT in the last 72 hours. Thyroid function studies No results for input(s): TSH, T4TOTAL, T3FREE, THYROIDAB in the last 72 hours.  Invalid input(s): FREET3 Anemia work up No results for input(s): VITAMINB12, FOLATE, FERRITIN, TIBC, IRON, RETICCTPCT in the last 72 hours. Urinalysis    Component Value Date/Time   COLORURINE YELLOW 04/03/2018 0928   APPEARANCEUR CLEAR 04/03/2018 0928   LABSPEC 1.027 04/03/2018 0928   PHURINE 5.0 04/03/2018 0928   GLUCOSEU 50 (A) 04/03/2018 0928   HGBUR MODERATE (A) 04/03/2018 0928   BILIRUBINUR NEGATIVE 04/03/2018 0928   KETONESUR 20 (A) 04/03/2018 0928   PROTEINUR 30 (A) 04/03/2018 0928   NITRITE NEGATIVE 04/03/2018 0928   LEUKOCYTESUR NEGATIVE 04/03/2018 0928   Sepsis Labs Invalid input(s): PROCALCITONIN,  WBC,  LACTICIDVEN Microbiology Recent Results (from the past 240 hour(s))  SARS Coronavirus 2 [facility] order, Performed in [facility] hospital lab) Nasopharyngeal Nasopharyngeal Swab     Status: None   Collection Time: 02/07/19 12:31 PM   Specimen: Nasopharyngeal Swab  Result Value Ref Range Status   SARS Coronavirus 2 NEGATIVE NEGATIVE Final    Comment: (NOTE) If result is NEGATIVE SARS-CoV-2 target nucleic acids are NOT  DETECTED. The SARS-CoV-2 RNA is generally detectable in upper and lower  respiratory specimens during the acute phase of infection. The lowest  concentration of SARS-CoV-2 viral copies this assay can detect is 250  copies / mL. A negative result does not preclude SARS-CoV-2 infection  and should not be used as the sole basis for treatment or other  patient management decisions.  A negative result may occur with  improper specimen collection / handling, submission of specimen other  than nasopharyngeal swab, presence of viral mutation(s) within the  areas targeted by this assay, and inadequate number of viral  copies  (<250 copies / mL). A negative result must be combined with clinical  observations, patient history, and epidemiological information. If result is POSITIVE SARS-CoV-2 target nucleic acids are DETECTED. The SARS-CoV-2 RNA is generally detectable in upper and lower  respiratory specimens dur ing the acute phase of infection.  Positive  results are indicative of active infection with SARS-CoV-2.  Clinical  correlation with patient history and other diagnostic information is  necessary to determine patient infection status.  Positive results do  not rule out bacterial infection or co-infection with other viruses. If result is PRESUMPTIVE POSTIVE SARS-CoV-2 nucleic acids MAY BE PRESENT.   A presumptive positive result was obtained on the submitted specimen  and confirmed on repeat testing.  While 2019 novel coronavirus  (SARS-CoV-2) nucleic acids may be present in the submitted sample  additional confirmatory testing may be necessary for epidemiological  and / or clinical management purposes  to differentiate between  SARS-CoV-2 and other Sarbecovirus currently known to infect humans.  If clinically indicated additional testing with an alternate test  methodology [phone]) is advised. The SARS-CoV-2 RNA is generally  detectable in upper and lower respiratory sp ecimens during  the acute  phase of infection. The expected result is Negative. Fact Sheet for Patients:  StrictlyIdeas.no Fact Sheet for Healthcare Providers: BankingDealers.co.za This test is not yet approved or cleared by the [place] FDA and has been authorized for detection and/or diagnosis of SARS-CoV-2 by FDA under an Emergency Use Authorization (EUA).  This EUA will remain in effect (meaning this test can be used) for the duration of the COVID-19 declaration under Section 564(b)(1) of the Act, 21 U.S.C. section 360bbb-3(b)(1), unless the authorization is terminated or revoked sooner. Performed at [facility], [place] [address] [place], [place] [address]      Time coordinating discharge: 45 minutes  SIGNED:   Tawni Millers, MD  [facility] 02/10/2019, 9:01 AM

## 2019-02-10 NOTE — TOC Initial Note (Signed)
Transition of Care [facility]) - Initial/Assessment Note    Patient Details  Name: Nathan Cook MRN: [id number] Date of Birth: Oct 25, 1921  Transition of Care [facility]) CM/SW Contact:    Vinie Sill, [place] Phone Number: 02/10/2019, 12:36 PM  Clinical Narrative:                  Patient is from [place] spoke with the patient's daughter, [place] and confirmed the patient will return to ALF at discharge. CSW spoke with Raquel Sarna at [facility]- patient will discharge to [facility] acuity unit for 2 weeks quarantine.   MD, RN informed- discharge is pending insurance approval- another Covid test is not required.   Thurmond Butts, MSW, [facility] Clinical Social Worker [phone]    Expected Discharge Plan: Assisted Living     Patient Goals and CMS Choice Patient states their goals for this hospitalization and ongoing recovery are:: return to ALF      Expected Discharge Plan and Services Expected Discharge Plan: Assisted Living In-house Referral: Clinical Social Work       Expected Discharge Date: 02/10/19                                    Prior Living Arrangements/Services   Lives with:: Facility Resident, Self Patient language and need for interpreter reviewed:: Yes Do you feel safe going back to the place where you live?: Yes      Need for Family Participation in Patient Care: Yes (Comment) Care giver support system in place?: Yes (comment)   Criminal Activity/Legal Involvement Pertinent to Current Situation/Hospitalization: No - Comment as needed  Activities of Daily Living Home Assistive Devices/Equipment: Wheelchair, Eyeglasses ADL Screening (condition at time of admission) Patient's cognitive ability adequate to safely complete daily activities?: Yes Is the patient deaf or have difficulty hearing?: No Does the patient have difficulty seeing, even when wearing glasses/contacts?: No Does the patient have difficulty concentrating,  remembering, or making decisions?: No Patient able to express need for assistance with ADLs?: Yes Does the patient have difficulty dressing or bathing?: Yes Independently performs ADLs?: No Communication: Dependent Is this a change from baseline?: Pre-admission baseline Dressing (OT): Dependent Is this a change from baseline?: Pre-admission baseline Grooming: Dependent Is this a change from baseline?: Pre-admission baseline Feeding: Dependent Is this a change from baseline?: Pre-admission baseline Bathing: Dependent Is this a change from baseline?: Pre-admission baseline Toileting: Dependent Is this a change from baseline?: Pre-admission baseline In/Out Bed: Dependent Is this a change from baseline?: Pre-admission baseline Walks in Home: Dependent Is this a change from baseline?: Pre-admission baseline Does the patient have difficulty walking or climbing stairs?: Yes Weakness of Legs: Both Weakness of Arms/Hands: Both  Permission Sought/Granted Permission sought to share information with : Family Supports Permission granted to share information with : Yes, Verbal Permission Granted  Share Information with NAME: Onnie Graham  Permission granted to share info w AGENCY: [facility]  Permission granted to share info w Relationship: daughter  Permission granted to share info w Contact Information: [phone]  Emotional Assessment Appearance:: Other (Comment Required(unable to assess) Attitude/Demeanor/Rapport: Unable to Assess Affect (typically observed): Unable to Assess Orientation: : Oriented to Self, Oriented to Place, Oriented to  Time, Oriented to Situation Alcohol / Substance Use: Not Applicable Psych Involvement: No (comment)  Admission diagnosis:  NONVIABLE RIGHT LEG Patient Active Problem List   Diagnosis Date Noted  .  S/P AKA (above knee amputation) unilateral, right ([place]) 02/07/2019  . Hyponatremia 01/24/2019  . Skin tear of elbow without complication,  initial encounter 01/20/2019  . History of CVA (cerebrovascular accident) 01/16/2019  . Edema 01/12/2019  . Cellulitis of right foot 01/10/2019  . Hypotension 12/14/2018  . Weight loss 10/23/2018  . Loose stools 10/21/2018  . Confusion 10/21/2018  . Blood loss anemia 10/21/2018  . Neuropathic pain 10/21/2018  . S/P AKA (above knee amputation) unilateral, left ([place]) 10/15/2018  . Pressure injury of skin 10/14/2018  . Critical lower limb ischemia 10/09/2018  . History of stroke with residual deficit 08/16/2018  . Open wound of right great toe 07/24/2018  . Stasis dermatitis of both legs 06/10/2018  . Dysphagia 04/12/2018  . Paroxysmal atrial fibrillation (HCC)   . Stroke ([place]) 04/01/2018  . PAD (peripheral artery disease) ([place]) 03/20/2018  . Generalized osteoarthritis 06/27/2017  . B12 deficiency 06/27/2017  . History of TIA (transient ischemic attack) 03/21/2017  . Vitamin D deficiency 03/21/2017  . Dry eyes, bilateral 03/21/2017  . CKD (chronic kidney disease) stage 3, GFR 30-59 ml/min (HCC) 03/21/2017  . Cervicalgia 01/04/2016  . Pain in joint, shoulder region 05/11/2015  . BPH (benign prostatic hyperplasia) 10/14/2014  . Hyperlipidemia LDL goal <70   . Benign hypertension with chronic kidney disease, stage III ([place])   . Controlled type 2 diabetes mellitus with stage 3 chronic kidney disease, without long-term current use of insulin ([place])   . Left knee pain 05/27/2014  . Brainstem infarct, acute ([place]) s/p IV tPA 11/06/2005  . History of colonic polyps 10/14/2003   PCP:  Virgie Dad, MD Pharmacy:   [facility] [address] [place] - [address] [place] [address] Phone: [phone] Fax: [phone]     Social Determinants of Health (SDOH) Interventions    Readmission Risk Interventions No flowsheet data found.

## 2019-02-10 NOTE — NC FL2 (Signed)
[place] LEVEL OF CARE SCREENING TOOL     IDENTIFICATION  Patient Name: Nathan Cook Birthdate: 05-Jun-1922 Sex: male Admission Date (Current Location): 02/07/2019  [facility] and [place] Number:  Herbalist and Address:  [facility] [address], [place] [address] [place], [place] [address]      Provider Number: [id number]  Attending Physician Name and Address:  Tawni Millers,*  Relative Name and Phone Number:  Carver Fila; daughter, [phone]    Current Level of Care: Hospital Recommended Level of Care: [place] Prior Approval Number:    Date Approved/Denied:   PASRR Number: [id number]  Discharge Plan: SNF    Current Diagnoses: Patient Active Problem List   Diagnosis Date Noted  . S/P AKA (above knee amputation) unilateral, right ([place]) 02/07/2019  . Hyponatremia 01/24/2019  . Skin tear of elbow without complication, initial encounter 01/20/2019  . History of CVA (cerebrovascular accident) 01/16/2019  . Edema 01/12/2019  . Cellulitis of right foot 01/10/2019  . Hypotension 12/14/2018  . Weight loss 10/23/2018  . Loose stools 10/21/2018  . Confusion 10/21/2018  . Blood loss anemia 10/21/2018  . Neuropathic pain 10/21/2018  . S/P AKA (above knee amputation) unilateral, left ([place]) 10/15/2018  . Pressure injury of skin 10/14/2018  . Critical lower limb ischemia 10/09/2018  . History of stroke with residual deficit 08/16/2018  . Open wound of right great toe 07/24/2018  . Stasis dermatitis of both legs 06/10/2018  . Dysphagia 04/12/2018  . Paroxysmal atrial fibrillation (HCC)   . Stroke ([place]) 04/01/2018  . PAD (peripheral artery disease) ([place]) 03/20/2018  . Generalized osteoarthritis 06/27/2017  . B12 deficiency 06/27/2017  . History of TIA (transient ischemic attack) 03/21/2017  . Vitamin D deficiency 03/21/2017  . Dry eyes, bilateral 03/21/2017  . CKD (chronic kidney disease) stage 3, GFR  30-59 ml/min (HCC) 03/21/2017  . Cervicalgia 01/04/2016  . Pain in joint, shoulder region 05/11/2015  . BPH (benign prostatic hyperplasia) 10/14/2014  . Hyperlipidemia LDL goal <70   . Benign hypertension with chronic kidney disease, stage III ([place])   . Controlled type 2 diabetes mellitus with stage 3 chronic kidney disease, without long-term current use of insulin ([place])   . Left knee pain 05/27/2014  . Brainstem infarct, acute ([place]) s/p IV tPA 11/06/2005  . History of colonic polyps 10/14/2003    Orientation RESPIRATION BLADDER Height & Weight     Self, Time, Place, Situation  Normal Incontinent, External catheter Weight: 119 lb 11.4 oz (54.3 kg) Height:  5\' 8"  (172.7 cm)  BEHAVIORAL SYMPTOMS/MOOD NEUROLOGICAL BOWEL NUTRITION STATUS      Continent Diet(see discharge summary)  AMBULATORY STATUS COMMUNICATION OF NEEDS Skin   Total Care Verbally Surgical wounds, Other (Comment), Skin abrasions(bilateral leg amputation with compression wrap on his right leg; abrasions on bilateral arms; generalized ecchymosis on ribs)                       Personal Care Assistance Level of Assistance  Bathing, Feeding, Dressing Bathing Assistance: Maximum assistance Feeding assistance: Limited assistance Dressing Assistance: Maximum assistance     Functional Limitations Info  Sight, Hearing, Speech Sight Info: Adequate Hearing Info: Adequate Speech Info: Adequate    SPECIAL CARE FACTORS FREQUENCY  PT (By licensed PT), OT (By licensed OT)     PT Frequency: 5x week OT Frequency: 5x week            Contractures Contractures Info: Not present  Additional Factors Info  Code Status, Allergies, Insulin Sliding Scale Code Status Info: Full Code Allergies Info: Tape   Insulin Sliding Scale Info: insulin aspart (novoLOG) injection 0-5 Units daily at bedtime; insulin aspart (novoLOG) injection 0-9 Units 3x daily with meals       Current Medications (02/10/2019):  This is the current  hospital active medication list Current Facility-Administered Medications  Medication Dose Route Frequency Provider Last Rate Last Dose  . acetaminophen (TYLENOL) tablet 650 mg  650 mg Oral Q6H PRN Elwyn Reach, MD   650 mg at 02/09/19 1343   Or  . acetaminophen (TYLENOL) suppository 650 mg  650 mg Rectal Q6H PRN Elwyn Reach, MD      . ferrous sulfate tablet 325 mg  325 mg Oral Q M,W,F Arrien, Jimmy Picket, MD   325 mg at 02/10/19 0835  . gabapentin (NEURONTIN) capsule 100 mg  100 mg Oral QHS Arrien, Jimmy Picket, MD   100 mg at 02/09/19 2230  . heparin injection 5,000 Units  5,000 Units Subcutaneous Q8H Dagoberto Ligas, PA-C   5,000 Units at 02/10/19 0601  . insulin aspart (novoLOG) injection 0-5 Units  0-5 Units Subcutaneous QHS Elwyn Reach, MD   3 Units at 02/08/19 2129  . insulin aspart (novoLOG) injection 0-9 Units  0-9 Units Subcutaneous TID WC Elwyn Reach, MD   1 Units at 02/10/19 1242  . metoprolol tartrate (LOPRESSOR) tablet 25 mg  25 mg Oral BID Tawni Millers, MD   25 mg at 02/10/19 0835  . morphine 2 MG/ML injection 2 mg  2 mg Intravenous Q3H PRN Gala Romney L, MD      . ondansetron (ZOFRAN) tablet 4 mg  4 mg Oral Q6H PRN Elwyn Reach, MD       Or  . ondansetron (ZOFRAN) injection 4 mg  4 mg Intravenous Q6H PRN Elwyn Reach, MD      . oxyCODONE-acetaminophen (PERCOCET/ROXICET) 5-325 MG per tablet 1-2 tablet  1-2 tablet Oral Q4H PRN Dagoberto Ligas, PA-C      . simvastatin (ZOCOR) tablet 10 mg  10 mg Oral QPM Arrien, Jimmy Picket, MD   10 mg at 02/09/19 1711  . tamsulosin (FLOMAX) capsule 0.4 mg  0.4 mg Oral QPC supper Arrien, Jimmy Picket, MD   0.4 mg at 02/09/19 1711     Discharge Medications: Please see discharge summary for a list of discharge medications.  Relevant Imaging Results:  Relevant Lab Results:   Additional Information SS# [id number]  [place], [place]

## 2019-02-10 NOTE — Progress Notes (Signed)
Report called to [facility]. Valjean Ruppel, Bettina Gavia RN

## 2019-02-10 NOTE — Evaluation (Signed)
Physical Therapy Evaluation Patient Details Name: Nathan Cook MRN: [id number] DOB: 1921-12-20 Today's Date: 02/10/2019   History of Present Illness  This 85 yo male with onset of non healing R foot wound with gangrene was admitted for R AKA on 02/09/19.  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  Pt is slightly distracted with stories, and is difficult to keep on task at times.  However, very focused on his actual movement when he is working.  Fatigued quickly with side scooting, but initially could fully clear his hips from the  Bed with UE's.  Follow acutely for progressing transfer skills, and work toward better sitting balance control.    Follow Up Recommendations SNF;Supervision for mobility/OOB    Equipment Recommendations  None recommended by PT    Recommendations for Other Services       Precautions / Restrictions Precautions Precautions: Fall Required Braces or Orthoses: Other Brace(R sleeve for stump later today) Restrictions Weight Bearing Restrictions: Yes Other Position/Activity Restrictions: NWB on RLE      Mobility  Bed Mobility Overal bed mobility: Needs Assistance Bed Mobility: Supine to Sit;Sit to Supine     Supine to sit: Mod assist Sit to supine: Mod assist   General bed mobility comments: mod assist to pull up with pt assisting with RUE on bed rail  Transfers Overall transfer level: Needs assistance Equipment used: None Transfers: Lateral/Scoot Transfers          Lateral/Scoot Transfers: Mod assist General transfer comment: mod assist for support with movement and to assist lift  Ambulation/Gait             General Gait Details: B AKA  Stairs            Wheelchair Mobility    Modified Rankin (Stroke Patients Only) Modified Rankin (Stroke Patients Only) Pre-Morbid Rankin  Score: Moderately severe disability Modified Rankin: Severe disability     Balance Overall balance assessment: Needs assistance Sitting-balance support: Bilateral upper extremity supported Sitting balance-Leahy Scale: Poor                                       Pertinent Vitals/Pain Pain Assessment: 0-10 Pain Score: 10-Worst pain ever Pain Location: R stump with mobility Pain Descriptors / Indicators: Operative site guarding;Grimacing Pain Intervention(s): Monitored during session;Premedicated before session;Repositioned    Home Living Family/patient expects to be discharged to:: Assisted living               Home Equipment: Grab bars - tub/shower;Shower seat - built in Additional Comments: [place]    Prior Function Level of Independence: Needs assistance   Gait / Transfers Assistance Needed: non ambulatory and using wc  ADL's / Homemaking Assistance Needed: living in ALF with help for all meals and meds        Hand Dominance   Dominant Hand: Right    Extremity/Trunk Assessment   Upper Extremity Assessment Upper Extremity Assessment: Generalized weakness    Lower Extremity Assessment Lower Extremity Assessment: (B AKA)    Cervical / Trunk Assessment Cervical / Trunk Assessment: Kyphotic(mild)  Communication   Communication: Expressive difficulties  Cognition Arousal/Alertness: Awake/alert Behavior During Therapy: WFL for tasks assessed/performed Overall Cognitive  Status: No family/caregiver present to determine baseline cognitive functioning                                 General Comments: pt is distracted by stories of [place]      General Comments General comments (skin integrity, edema, etc.): pt is able to hold fair sit balance for short moments with UE support but without is poor    Exercises     Assessment/Plan    PT Assessment Patient needs continued PT services  PT Problem List Decreased  strength;Decreased range of motion;Decreased activity tolerance;Decreased balance;Decreased mobility;Decreased coordination;Decreased cognition;Decreased safety awareness;Cardiopulmonary status limiting activity;Decreased skin integrity;Pain       PT Treatment Interventions DME instruction;Functional mobility training;Therapeutic activities;Therapeutic exercise;Balance training;Neuromuscular re-education;Patient/family education    PT Goals (Current goals can be found in the Care Plan section)  Acute Rehab PT Goals Patient Stated Goal: to get back home to [place]" PT Goal Formulation: With patient Time For Goal Achievement: 02/24/19 Potential to Achieve Goals: Good    Frequency Min 2X/week   Barriers to discharge Decreased caregiver support in ALF will not get close help with all movement    Co-evaluation               AM-PAC PT "6 Clicks" Mobility  Outcome Measure Help needed turning from your back to your side while in a flat bed without using bedrails?: A Little Help needed moving from lying on your back to sitting on the side of a flat bed without using bedrails?: A Lot Help needed moving to and from a bed to a chair (including a wheelchair)?: A Lot Help needed standing up from a chair using your arms (e.g., wheelchair or bedside chair)?: Total Help needed to walk in hospital room?: Total Help needed climbing 3-5 steps with a railing? : Total 6 Click Score: 10    End of Session   Activity Tolerance: Patient limited by fatigue;Treatment limited secondary to medical complications (Comment)(new R AKA) Patient left: in bed;with call bell/phone within reach;with bed alarm set Nurse Communication: Mobility status PT Visit Diagnosis: Muscle weakness (generalized) (M62.81);Pain;Other abnormalities of gait and mobility (R26.89) Pain - Right/Left: Right Pain - part of body: Leg    Time: 2836-6294 PT Time Calculation (min) (ACUTE ONLY): 25 min   Charges:   PT Evaluation $PT  Eval Moderate Complexity: 1 Mod PT Treatments $Therapeutic Activity: 8-22 mins       Ramond Dial 02/10/2019, 11:19 AM   Mee Hives, PT MS Acute Rehab Dept. Number: [place] and [place]

## 2019-02-10 NOTE — Evaluation (Signed)
Occupational Therapy Evaluation Patient Details Name: Nathan Cook MRN: [id number] DOB: 03-12-1922 Today's Date: 02/10/2019    History of Present Illness This 85 yo male with onset of non healing R foot wound with gangrene was admitted for R AKA on 02/09/19.  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 Admitted for above and limited by problem list below, including impaired balance, pain in R AKA, and generalized weakness.  Currently requires mod assist for bed mobility, min assist for UB ADLs and max-total assist for LB ADLs.  He will benefit from continued OT services while admitted and after dc at SNF level in order to maximize independence with ADLs/mobility.     Follow Up Recommendations  SNF;Supervision/Assistance - 24 hour    Equipment Recommendations  None recommended by OT    Recommendations for Other Services       Precautions / Restrictions Precautions Precautions: Fall Required Braces or Orthoses: Other Brace Other Brace: R resdiual limb sock  Restrictions Weight Bearing Restrictions: Yes Other Position/Activity Restrictions: NWB on RLE      Mobility Bed Mobility Overal bed mobility: Needs Assistance Bed Mobility: Supine to Sit;Sit to Supine     Supine to sit: Mod assist Sit to supine: Mod assist      Transfers Overall transfer level: Needs assistance               General transfer comment: deferred     Balance Overall balance assessment: Needs assistance Sitting-balance support: Bilateral upper extremity supported;Single extremity supported Sitting balance-Leahy Scale: Poor Sitting balance - Comments: min assist to min guard, reliant on BUE support                                    ADL either performed or assessed with clinical judgement   ADL Overall ADL's : Needs  assistance/impaired     Grooming: Set up;Bed level   Upper Body Bathing: Minimal assistance;Sitting   Lower Body Bathing: Minimal assistance;Bed level   Upper Body Dressing : Sitting;Minimal assistance   Lower Body Dressing: Minimal assistance;Bed level     Toilet Transfer Details (indicate cue type and reason): deferred d/t safety          Functional mobility during ADLs: Moderate assistance(limited to bed mobility only) General ADL Comments: pt limited by pain, impaired balance      Vision         Perception     Praxis      Pertinent Vitals/Pain Pain Assessment: Faces Faces Pain Scale: Hurts little more Pain Location: R stump with mobility Pain Descriptors / Indicators: Operative site guarding;Grimacing Pain Intervention(s): Monitored during session;Repositioned     Hand Dominance Right   Extremity/Trunk Assessment Upper Extremity Assessment Upper Extremity Assessment: Generalized weakness   Lower Extremity Assessment Lower Extremity Assessment: Defer to PT evaluation       Communication Communication Communication: No difficulties   Cognition Arousal/Alertness: Awake/alert Behavior During Therapy: WFL for tasks assessed/performed Overall Cognitive Status: Within Functional Limits for tasks assessed                                 General Comments: appears WFL, pt oriented, aware of deficits and needs for  assistance; able to follow commands and engage appropratiely   General Comments       Exercises     Shoulder Instructions      Home Living Family/patient expects to be discharged to:: Skilled nursing facility                                 Additional Comments[facility] plans to return to SNF rehab section       Prior Functioning/Environment Level of Independence: Needs assistance  Gait / Transfers Assistance Needed: using wc PTA  ADL's / Homemaking Assistance Needed: some assist with bathing  (shower), but otherwise independent with dressing             OT Problem List: Decreased strength;Decreased activity tolerance;Impaired balance (sitting and/or standing);Decreased safety awareness;Decreased knowledge of use of DME or AE;Pain      OT Treatment/Interventions: Self-care/ADL training;Balance training;Therapeutic activities;Therapeutic exercise;DME and/or AE instruction    OT Goals(Current goals can be found in the care plan section) Acute Rehab OT Goals Patient Stated Goal: to get to rehab OT Goal Formulation: With patient Time For Goal Achievement: 02/24/19 Potential to Achieve Goals: Good  OT Frequency: Min 2X/week   Barriers to D/C:            Co-evaluation              AM-PAC OT "6 Clicks" Daily Activity     Outcome Measure Help from another person eating meals?: A Little Help from another person taking care of personal grooming?: A Little Help from another person toileting, which includes using toliet, bedpan, or urinal?: Total Help from another person bathing (including washing, rinsing, drying)?: A Lot Help from another person to put on and taking off regular upper body clothing?: A Little Help from another person to put on and taking off regular lower body clothing?: A Lot 6 Click Score: 14   End of Session Nurse Communication: Mobility status  Activity Tolerance: Patient tolerated treatment well Patient left: in bed;with call bell/phone within reach;with family/visitor present  OT Visit Diagnosis: Other abnormalities of gait and mobility (R26.89);Muscle weakness (generalized) (M62.81);Pain Pain - Right/Left: Right Pain - part of body: Leg                Time: 6295-2841 OT Time Calculation (min): 19 min Charges:  OT General Charges $OT Visit: 1 Visit OT Evaluation $OT Eval Moderate Complexity: [place], OT Acute Rehabilitation Services Pager [phone] Office [phone]   Delight Stare 02/10/2019, 4:53 PM

## 2019-02-10 NOTE — Discharge Instructions (Signed)
Incision Care, Adult °An incision is a surgical cut that is made through your skin. Most incisions are closed after surgery. Your incision may be closed with stitches (sutures), staples, skin glue, or adhesive strips. You may need to return to your health care provider to have sutures or staples removed. This may occur several days to several weeks after your surgery. The incision needs to be cared for properly to prevent infection. °How to care for your incision °Incision care ° °· Follow instructions from your health care provider about how to take care of your incision. Make sure you: °? Wash your hands with soap and water before you change the bandage (dressing). If soap and water are not available, use hand sanitizer. °? Change your dressing as told by your health care provider. °? Leave sutures, skin glue, or adhesive strips in place. These skin closures may need to stay in place for 2 weeks or longer. If adhesive strip edges start to loosen and curl up, you may trim the loose edges. Do not remove adhesive strips completely unless your health care provider tells you to do that. °· Check your incision area every day for signs of infection. Check for: °? More redness, swelling, or pain. °? More fluid or blood. °? Warmth. °? Pus or a bad smell. °· Ask your health care provider how to clean the incision. This may include: °? Using mild soap and water. °? Using a clean towel to pat the incision dry after cleaning it. °? Applying a cream or ointment. Do this only as told by your health care provider. °? Covering the incision with a clean dressing. °· Ask your health care provider when you can leave the incision uncovered. °· Do not take baths, swim, or use a hot tub until your health care provider approves. Ask your health care provider if you can take showers. You may only be allowed to take sponge baths for bathing. °Medicines °· If you were prescribed an antibiotic medicine, cream, or ointment, take or apply the  antibiotic as told by your health care provider. Do not stop taking or applying the antibiotic even if your condition improves. °· Take over-the-counter and prescription medicines only as told by your health care provider. °General instructions °· Limit movement around your incision to improve healing. °? Avoid straining, lifting, or exercise for the first month, or for as long as told by your health care provider. °? Follow instructions from your health care provider about returning to your normal activities. °? Ask your health care provider what activities are safe. °· Protect your incision from the sun when you are outside for the first 6 months, or for as long as told by your health care provider. Apply sunscreen around the scar or cover it up. °· Keep all follow-up visits as told by your health care provider. This is important. °Contact a health care provider if: °· Your have more redness, swelling, or pain around the incision. °· You have more fluid or blood coming from the incision. °· Your incision feels warm to the touch. °· You have pus or a bad smell coming from the incision. °· You have a fever or shaking chills. °· You are nauseous or you vomit. °· You are dizzy. °· Your sutures or staples come undone. °Get help right away if: °· You have a red streak coming from your incision. °· Your incision bleeds through the dressing and the bleeding does not stop with gentle pressure. °· The edges of   your incision open up and separate. °· You have severe pain. °· You have a rash. °· You are confused. °· You faint. °· You have trouble breathing and a fast heartbeat. °This information is not intended to replace advice given to you by your health care provider. Make sure you discuss any questions you have with your health care provider. °Document Released: 12/30/2004 Document Revised: 06/14/2018 Document Reviewed: 12/29/2015 °Elsevier Patient Education © 2020 [employer] ° °

## 2019-02-10 NOTE — Care Management Important Message (Signed)
Important Message  Patient Details  Name: Nathan Cook MRN: [id number] Date of Birth: Jul 05, 1921   Medicare Important Message Given:  Yes     Shelda Altes 02/10/2019, 1:27 PM

## 2019-02-11 ENCOUNTER — Non-Acute Institutional Stay (SKILLED_NURSING_FACILITY): Payer: Medicare Other | Admitting: Internal Medicine

## 2019-02-11 ENCOUNTER — Encounter: Payer: Self-pay | Admitting: Internal Medicine

## 2019-02-11 DIAGNOSIS — N183 Chronic kidney disease, stage 3 unspecified: Secondary | ICD-10-CM

## 2019-02-11 DIAGNOSIS — Z89611 Acquired absence of right leg above knee: Secondary | ICD-10-CM

## 2019-02-11 DIAGNOSIS — E1122 Type 2 diabetes mellitus with diabetic chronic kidney disease: Secondary | ICD-10-CM | POA: Diagnosis not present

## 2019-02-11 DIAGNOSIS — I480 Paroxysmal atrial fibrillation: Secondary | ICD-10-CM | POA: Diagnosis not present

## 2019-02-11 LAB — BASIC METABOLIC PANEL
BUN: 20 (ref 4–21)
CO2: 26 — AB (ref 13–22)
Chloride: 96 — AB (ref 99–108)
Creatinine: 0.9 (ref 0.6–1.3)
Glucose: 159
Potassium: 4.7 (ref 3.4–5.3)
Sodium: 129 — AB (ref 137–147)

## 2019-02-11 LAB — CBC: RBC: 2.98 — AB (ref 3.87–5.11)

## 2019-02-11 LAB — CBC AND DIFFERENTIAL
HCT: 29 — AB (ref 41–53)
Hemoglobin: 9.8 — AB (ref 13.5–17.5)
Platelets: 438 — AB (ref 150–399)
WBC: 9.7

## 2019-02-11 LAB — COMPREHENSIVE METABOLIC PANEL: Calcium: 8.1 — AB (ref 8.7–10.7)

## 2019-02-11 NOTE — Progress Notes (Signed)
Provider:   Location:  [facility] Room Number: [id number] Place of Service:  SNF [phone])  PCP: Virgie Dad, MD Patient Care Team: Virgie Dad, MD as PCP - General (Internal Medicine) Buford Dresser, MD as PCP - Cardiology (Cardiology) Ricard Dillon, MD as Consulting Physician (Internal Medicine)  Extended Emergency Contact Information Primary Emergency Contact: Gordon,[place] Address: [address]          [place], [place] [address] [name] of [place] Phone: [phone] Mobile Phone: [phone] Relation: Daughter  Code Status:  Goals of Care: Advanced Directive information Advanced Directives 02/11/2019  Does Patient Have a Medical Advance Directive? Yes  Type of Paramedic of [place];Living will;Out of facility DNR (pink MOST or yellow form)  Does patient want to make changes to medical advance directive? No - Patient declined  Copy of [place] in Chart? Yes - validated most recent copy scanned in chart (See row information)  Pre-existing out of facility DNR order (yellow form or pink MOST form) Yellow form placed in chart (order not valid for inpatient use)      Chief Complaint  Patient presents with  . Readmit To SNF    HPI: Patient is a 85 y.o. male seen today for Readmission to SNF for therapy after Undergoing Right AKA on 08/14. Patient has h/o Hypertension, Hyperlipidemia, Diabetes mellitus, h/o Brain stem Infarct s/p TPA In 10/19, Hyponatremia,Atrial Fibrillation on Eliquis since 10/19.,PADs/p Left AKA  Patient has history of severe PAD and had a nonhealing ulcer on his right foot.  He finally agreed to undergo right AKA.  He was electively admitted and underwent procedure.  His postop course was complicated with urinary retention.  He was started on Flomax and he responded to it. He also had mild hyponatremia. Patient is readmitted in SNF for therapy. He states his  pain is controlled with as needed Tylenol. He is not having any issues with voiding.  Wanted his Flomax to be stopped His only complaint was constipation.  He did look little confused about the events. Has not had any fever and nausea or abdominal pain   Past Medical History:  Diagnosis Date  . BPH (benign prostatic hyperplasia) 10/14/2014  . Cervicalgia 01/04/2016  . Chronic kidney disease   . Contusion of left shoulder 05/27/14  . DM type 2 (diabetes mellitus, type 2) ([place])   . Dysphagia   . High blood pressure   . History of basal cell cancer 11/21/2005   Right temple  . History of colonic polyps 10/14/2003  . Hyperlipidemia   . Left knee pain 05/27/14  . Peripheral neuropathic pain   . Peripheral vascular disease ([place])   . SAH (subarachnoid hemorrhage) ([place]) 2009   TBI  . Stroke ([place])   . TIA (transient ischemic attack) 11/06/2005   Left eye pain. Slurred speech. Diaphoresis. No residual effects.    Past Surgical History:  Procedure Laterality Date  . ABDOMINAL AORTOGRAM W/LOWER EXTREMITY N/A 06/12/2018   Procedure: ABDOMINAL AORTOGRAM W/LOWER EXTREMITY;  Surgeon: Marty Heck, MD;  Location: [facility];  Service: Cardiovascular;  Laterality: N/A;  . Achilles tendon repair Left 1998   ruptured tendon  . AMPUTATION Left 10/11/2018   Procedure: Left  AMPUTATION ABOVE KNEE;  Surgeon: Marty Heck, MD;  Location: [facility];  Service: Vascular;  Laterality: Left;  . AMPUTATION Right 02/07/2019   Procedure: AMPUTATION ABOVE KNEE;  Surgeon: Rosetta Posner, MD;  Location:  [facility];  Service: Vascular;  Laterality: Right;  . COLONOSCOPY  1992   Polyp removed  . COLONOSCOPY  1998   normal  . ORIF FEMUR FRACTURE Right 1929  . PERIPHERAL VASCULAR INTERVENTION Left 06/12/2018   Procedure: PERIPHERAL VASCULAR INTERVENTION;  Surgeon: Marty Heck, MD;  Location: [facility];  Service: Cardiovascular;  Laterality: Left;  Attempted   . TONSILLECTOMY       reports that he quit smoking about 47 years ago. He has never used smokeless tobacco. He reports previous alcohol use of about 1.0 - 2.0 standard drinks of alcohol per week. He reports that he does not use drugs. Social History   Socioeconomic History  . Marital status: Married    Spouse name: Not on file  . Number of children: Not on file  . Years of education: Not on file  . Highest education level: Not on file  Occupational History  . Occupation: retired Copy  . Financial resource strain: Not on file  . Food insecurity    Worry: Not on file    Inability: Not on file  . Transportation needs    Medical: Not on file    Non-medical: Not on file  Tobacco Use  . Smoking status: Former Smoker    Quit date: 06/26/1971    Years since quitting: 47.6  . Smokeless tobacco: Never Used  . Tobacco comment: 2 1/2 packs a day, until 1972  Substance and Sexual Activity  . Alcohol use: Not Currently    Alcohol/week: 1.0 - 2.0 standard drinks    Types: 1 - 2 Glasses of wine per week    Comment: Wine nightly  . Drug use: No  . Sexual activity: Not on file  Lifestyle  . Physical activity    Days per week: Not on file    Minutes per session: Not on file  . Stress: Not on file  Relationships  . Social Herbalist on phone: Not on file    Gets together: Not on file    Attends religious service: Not on file    Active member of club or organization: Not on file    Attends meetings of clubs or organizations: Not on file    Relationship status: Not on file  . Intimate partner violence    Fear of current or ex partner: Not on file    Emotionally abused: Not on file    Physically abused: Not on file    Forced sexual activity: Not on file  Other Topics Concern  . Not on file  Social History Narrative   Patient lives at [facility] since Dec 2015   Caffeine- Coffee   Married- Yes, 1951   [employer] 1943-46   House- Apartment with 2 people   Pets- No    Current/past profession- Press photographer   Exercise- Yes, walking   Living will- Yes   DNR- Yes   POA/HPOA- Yes       Functional Status Survey:    Family History  Problem Relation Age of Onset  . Heart attack Mother   . Heart disease Mother   . Heart disease Father     Health Maintenance  Topic Date Due  . OPHTHALMOLOGY EXAM  07/07/2017  . FOOT EXAM  06/27/2018  . INFLUENZA VACCINE  01/25/2019  . HEMOGLOBIN A1C  06/18/2019  . TETANUS/TDAP  04/29/2027  . PNA vac Low Risk Adult  Completed    Allergies  Allergen Reactions  . Tape Other (See Comments)    TAPE WILL TEAR THE SKIN!!!!    Outpatient Encounter Medications as of 02/11/2019  Medication Sig  . acetaminophen (TYLENOL 8 HOUR) 650 MG CR tablet Take 1 tablet (650 mg total) by mouth every 8 (eight) hours as needed for pain.  . Amino Acids-Protein Hydrolys (FEEDING SUPPLEMENT, PRO-STAT SUGAR FREE 64,) LIQD Take 30 mLs by mouth daily.  Marland Kitchen apixaban (ELIQUIS) 2.5 MG TABS tablet Take 2.5 mg by mouth 2 (two) times daily.  . bisacodyl (DULCOLAX) 10 MG suppository Place 10 mg rectally daily as needed for moderate constipation.  . Calcium Carbonate-Vitamin D 600-400 MG-UNIT tablet Take 1 tablet by mouth daily.  . Cholecalciferol 1000 UNITS tablet Take 1,000 Units by mouth daily.  Marland Kitchen gabapentin (NEURONTIN) 100 MG capsule TAKE ONE CAPSULE BY MOUTH EVERY NIGHT AT BEDTIME (Patient taking differently: Take 100 mg by mouth at bedtime. )  . losartan (COZAAR) 25 MG tablet Take 25 mg by mouth daily.  . metFORMIN (GLUCOPHAGE) 500 MG tablet Take 1 tablet (500 mg total) by mouth 2 (two) times daily with a meal.  . metoprolol tartrate (LOPRESSOR) 25 MG tablet Take 1 tablet (25 mg total) by mouth 2 (two) times daily.  . Nutritional Supplements (BOOST GLUCOSE CONTROL) LIQD Take 237 mLs by mouth 2 (two) times daily between meals. Twice a day between meals   . oxyCODONE-acetaminophen (PERCOCET/ROXICET) 5-325 MG tablet Take 1 tablet by mouth every 6 (six)  hours as needed for severe pain.  Marland Kitchen Propylene Glycol-Glycerin 1-0.3 % SOLN Place 2 drops into both eyes daily as needed (dry eyes).   . saccharomyces boulardii (FLORASTOR) 250 MG capsule Take 250 mg by mouth 2 (two) times daily.  . sennosides-docusate sodium (SENOKOT-S) 8.6-50 MG tablet Take 1 tablet by mouth daily.  . simvastatin (ZOCOR) 10 MG tablet TAKE ONE TABLET BY MOUTH DAILY (Patient taking differently: Take 10 mg by mouth every evening. )  . tamsulosin (FLOMAX) 0.4 MG CAPS capsule Take 1 capsule (0.4 mg total) by mouth daily after supper.  . zinc oxide 20 % ointment Apply 1 application topically as needed for irritation. Apply to buttocks after every incontinent episode and as needed for redness  . [DISCONTINUED] ferrous sulfate 325 (65 FE) MG tablet Take 325 mg by mouth every Monday, Wednesday, and Friday.    No facility-administered encounter medications on file as of 02/11/2019.     Review of Systems  Review of Systems  Constitutional: Negative for activity change, appetite change, chills, diaphoresis, fatigue and fever.  HENT: Negative for mouth sores, postnasal drip, rhinorrhea, sinus pain and sore throat.   Respiratory: Negative for apnea, cough, chest tightness, shortness of breath and wheezing.   Cardiovascular: Negative for chest pain, palpitations and leg swelling.  Gastrointestinal: Negative for abdominal distention, abdominal pain, , diarrhea, nausea and vomiting.  Genitourinary: Negative for dysuria and frequency.  Musculoskeletal: Negative for arthralgias, joint swelling and myalgias.  Skin: Negative for rash.  Neurological: Negative for dizziness, syncope, weakness, light-headedness and numbness.  Psychiatric/Behavioral: Negative for behavioral problems, confusion and sleep disturbance.     Vitals:   02/11/19 1330  BP: 136/78  Pulse: 90  Resp: 20  Temp: 98.6 F (37 C)  SpO2: 90%  Weight: 122 lb 3.2 oz (55.4 kg)  Height: 5\' 8"  (1.727 m)   Body mass index is  18.58 kg/m. Physical Exam  Constitutional: Oriented to person, place, and time. Well-developed and well-nourished.  HENT:  Head: Normocephalic.  Mouth/Throat:  Oropharynx is clear and moist.  Eyes: Pupils are equal, round, and reactive to light.  Neck: Neck supple.  Cardiovascular: Normal rate and normal heart sounds. Ruthm was regular No murmur heard. Pulmonary/Chest: Effort normal and breath sounds normal. No respiratory distress. No wheezes. She has no rales.  Abdominal: Soft. Bowel sounds are normal. No distension. There is no tenderness. There is no rebound.  Musculoskeletal: Bilateral S/P AKA Surgical Wound was clear Lymphadenopathy: none Neurological: Alert and oriented to person, place, and time.  Skin: Skin is warm and dry.  Psychiatric: Normal mood and affect. Behavior is normal. Thought content normal.    Labs reviewed: Basic Metabolic Panel: Recent Labs    02/08/19 0245 02/09/19 0311 02/10/19 0402  NA 129* 129* 128*  K 4.7 4.4 4.5  CL 94* 98 95*  CO2 22 23 25   GLUCOSE 242* 113* 176*  BUN 20 27* 21  CREATININE 1.22 1.21 0.98  CALCIUM 8.5* 8.2* 8.1*   Liver Function Tests: Recent Labs    08/29/18 0815 10/09/18 1352 10/09/18 1920 10/17/18 10/22/18  AST 15 23 22 22 16   ALT 13 22 21 14 11   ALKPHOS  --  64 68 67 69  BILITOT 0.7 0.8 0.7  --   --   PROT 6.0* 6.3* 6.2* 4.6  --   ALBUMIN  --  2.5* 2.5* 2.2  --    No results for input(s): LIPASE, AMYLASE in the last 8760 hours. No results for input(s): AMMONIA in the last 8760 hours. CBC: Recent Labs    04/01/18 1432  08/29/18 0815  02/07/19 1233 02/08/19 0245 02/09/19 0311  WBC 8.9   < > 11.7*   < > 9.7 14.3* 14.5*  NEUTROABS 6.3  --  8,471*  --   --   --  10.9*  HGB 12.7*   < > 12.0*   < > 11.8* 11.6* 9.5*  HCT 38.4*   < > 34.5*   < > 34.4* 33.4* 27.5*  MCV 104.9*   < > 99.7   < > 97.5 96.8 96.8  PLT 317   < > 455*   < > 448* 454* 459*   < > = values in this interval not displayed.   Cardiac  Enzymes: Recent Labs    10/09/18 1352  CKTOTAL 72   BNP: Invalid input(s): POCBNP Lab Results  Component Value Date   HGBA1C 6.8 12/17/2018   Lab Results  Component Value Date   TSH 1.69 10/24/2018   Lab Results  Component Value Date   VITAMINB12 1,226 (H) 09/10/2017   No results found for: FOLATE No results found for: IRON, TIBC, FERRITIN  Imaging and Procedures obtained prior to SNF admission: No results found.  Assessment/Plan S/P Right AKA Doing well Pain controlled with Tylenol Dry Dressing for the Incision Staples out in 4 weeks Will start working with Therapy for Upper Body strength  Hyponatremia Repeat Bmp pending  Anemia Hgb  stable Continue iron Leucocytosis No fever or any other symptoms if infection Repeat CBC pending Urinary Retention Resolved now Will continue Flomax for few more weeks Diabetes mellitus type 2 CBG mostly less then 200 A1C was 6.8 Neuropathy Continue on low-dose of Neurontin Paroxysmal A Fib On Low Doe of Eliquis Rate control on Lopressor Mild Depression Per nurses patient seemed depressed Will continue to monitor Constipation Will start on Senna and Colace  H/O CVA Blood pressure is controlled On Eliquis and statin CKD stage III Creatinine stable Hyperlipidemia On Zocor LDL 59 in  03/20    Family/ staff Communication:   Labs/tests ordered: Total time spent in this patient care encounter was  45_  minutes; greater than 50% of the visit spent counseling patient and staff, reviewing records , Labs and coordinating care for problems addressed at this encounter.

## 2019-02-17 ENCOUNTER — Encounter: Payer: Self-pay | Admitting: Internal Medicine

## 2019-02-17 ENCOUNTER — Non-Acute Institutional Stay (SKILLED_NURSING_FACILITY): Payer: Medicare Other | Admitting: Internal Medicine

## 2019-02-17 DIAGNOSIS — I959 Hypotension, unspecified: Secondary | ICD-10-CM

## 2019-02-17 DIAGNOSIS — I480 Paroxysmal atrial fibrillation: Secondary | ICD-10-CM

## 2019-02-17 DIAGNOSIS — Z89611 Acquired absence of right leg above knee: Secondary | ICD-10-CM

## 2019-02-17 DIAGNOSIS — N183 Chronic kidney disease, stage 3 unspecified: Secondary | ICD-10-CM

## 2019-02-17 DIAGNOSIS — Z89612 Acquired absence of left leg above knee: Secondary | ICD-10-CM

## 2019-02-17 NOTE — Progress Notes (Deleted)
Location:  [facility] Room Number: 811 Place of Service:  SNF [phone]) Provider:  Veleta Miners  MD  Virgie Dad, MD  Patient Care Team: Virgie Dad, MD as PCP - General (Internal Medicine) Buford Dresser, MD as PCP - Cardiology (Cardiology) Ricard Dillon, MD as Consulting Physician (Internal Medicine)  Extended Emergency Contact Information Primary Emergency Contact: Gordon,[place] Address: [address]          [place], [place] [address] Nathan Cook of [place] Phone: [phone] Mobile Phone: [phone] Relation: Daughter  Code Status:  DNR Goals of care: Advanced Directive information Advanced Directives 02/11/2019  Does Patient Have a Medical Advance Directive? Yes  Type of Paramedic of [place];Living will;Out of facility DNR (pink MOST or yellow form)  Does patient want to make changes to medical advance directive? No - Patient declined  Copy of [place] in Chart? Yes - validated most recent copy scanned in chart (See row information)  Pre-existing out of facility DNR order (yellow form or pink MOST form) Yellow form placed in chart (order not valid for inpatient use)     Chief Complaint  Patient presents with  . Acute Visit    C/o - Low BP    HPI:  Pt is a 85 y.o. male seen today for an acute visit for    Past Medical History:  Diagnosis Date  . BPH (benign prostatic hyperplasia) 10/14/2014  . Cervicalgia 01/04/2016  . Chronic kidney disease   . Contusion of left shoulder 05/27/14  . DM type 2 (diabetes mellitus, type 2) ([facility])   . Dysphagia   . High blood pressure   . History of basal cell cancer 11/21/2005   Right temple  . History of colonic polyps 10/14/2003  . Hyperlipidemia   . Left knee pain 05/27/14  . Peripheral neuropathic pain   . Peripheral vascular disease ([facility])   . SAH (subarachnoid hemorrhage) ([facility]) 2009   TBI  . Stroke ([facility])   . TIA (transient  ischemic attack) 11/06/2005   Left eye pain. Slurred speech. Diaphoresis. No residual effects.    Past Surgical History:  Procedure Laterality Date  . ABDOMINAL AORTOGRAM W/LOWER EXTREMITY N/A 06/12/2018   Procedure: ABDOMINAL AORTOGRAM W/LOWER EXTREMITY;  Surgeon: Marty Heck, MD;  Location: [facility];  Service: Cardiovascular;  Laterality: N/A;  . Achilles tendon repair Left 1998   ruptured tendon  . AMPUTATION Left 10/11/2018   Procedure: Left  AMPUTATION ABOVE KNEE;  Surgeon: Marty Heck, MD;  Location: [facility];  Service: Vascular;  Laterality: Left;  . AMPUTATION Right 02/07/2019   Procedure: AMPUTATION ABOVE KNEE;  Surgeon: Rosetta Posner, MD;  Location: [facility];  Service: Vascular;  Laterality: Right;  . COLONOSCOPY  1992   Polyp removed  . COLONOSCOPY  1998   normal  . ORIF FEMUR FRACTURE Right 1929  . PERIPHERAL VASCULAR INTERVENTION Left 06/12/2018   Procedure: PERIPHERAL VASCULAR INTERVENTION;  Surgeon: Marty Heck, MD;  Location: [facility];  Service: Cardiovascular;  Laterality: Left;  Attempted   . TONSILLECTOMY      Allergies  Allergen Reactions  . Tape Other (See Comments)    TAPE WILL TEAR THE SKIN!!!!    Outpatient Encounter Medications as of 02/17/2019  Medication Sig  . acetaminophen (ACETAMINOPHEN 8 HOUR) 650 MG CR tablet Take 650 mg by mouth every 8 (eight) hours as needed for pain.  . Amino Acids-Protein Hydrolys (FEEDING  SUPPLEMENT, PRO-STAT SUGAR FREE 64,) LIQD Take 30 mLs by mouth daily.  Marland Kitchen apixaban (ELIQUIS) 2.5 MG TABS tablet Take 2.5 mg by mouth 2 (two) times daily.  . bisacodyl (DULCOLAX) 10 MG suppository Place 10 mg rectally daily as needed for moderate constipation.  . Calcium Carbonate-Vitamin D 600-400 MG-UNIT tablet Take 1 tablet by mouth daily.  . Cholecalciferol 1000 UNITS tablet Take 1,000 Units by mouth daily.  . ferrous sulfate 325 (65 FE) MG tablet Take 325 mg by mouth. Once on Mon, Wed and Fri with  meals.  . gabapentin (NEURONTIN) 100 MG capsule TAKE ONE CAPSULE BY MOUTH EVERY NIGHT AT BEDTIME (Patient taking differently: Take 100 mg by mouth at bedtime. )  . losartan (COZAAR) 25 MG tablet Take 25 mg by mouth daily.  . metFORMIN (GLUCOPHAGE) 500 MG tablet Take 1 tablet (500 mg total) by mouth 2 (two) times daily with a meal.  . metoprolol tartrate (LOPRESSOR) 25 MG tablet Take 1 tablet (25 mg total) by mouth 2 (two) times daily.  . Nutritional Supplements (BOOST GLUCOSE CONTROL) LIQD Take 237 mLs by mouth 2 (two) times daily between meals. Twice a day between meals   . oxyCODONE-acetaminophen (PERCOCET/ROXICET) 5-325 MG tablet Take 1 tablet by mouth every 6 (six) hours as needed for severe pain.  Marland Kitchen Propylene Glycol-Glycerin 1-0.3 % SOLN Place 2 drops into both eyes daily as needed (dry eyes).   . saccharomyces boulardii (FLORASTOR) 250 MG capsule Take 250 mg by mouth 2 (two) times daily.  . sennosides-docusate sodium (SENOKOT-S) 8.6-50 MG tablet Take 1 tablet by mouth daily.  . simvastatin (ZOCOR) 10 MG tablet TAKE ONE TABLET BY MOUTH DAILY (Patient taking differently: Take 10 mg by mouth every evening. )  . tamsulosin (FLOMAX) 0.4 MG CAPS capsule Take 1 capsule (0.4 mg total) by mouth daily after supper.  . [DISCONTINUED] acetaminophen (TYLENOL 8 HOUR) 650 MG CR tablet Take 1 tablet (650 mg total) by mouth every 8 (eight) hours as needed for pain.  . [DISCONTINUED] zinc oxide 20 % ointment Apply 1 application topically as needed for irritation. Apply to buttocks after every incontinent episode and as needed for redness   No facility-administered encounter medications on file as of 02/17/2019.     Review of Systems  Immunization History  Administered Date(s) Administered  . DTaP 12/31/2012  . Influenza, High Dose Seasonal PF 04/04/2017  . Influenza,inj,Quad PF,6+ Mos 03/28/2018  . Influenza-Unspecified 04/10/2014, 03/25/2015, 04/06/2016  . PPD Test 04/29/2014  . Pneumococcal  Conjugate-13 04/28/2017  . Pneumococcal-Unspecified 03/13/2011  . Td 12/24/2012  . Tdap 04/28/2017  . Zoster Recombinat (Shingrix) 12/14/2017   Pertinent  Health Maintenance Due  Topic Date Due  . OPHTHALMOLOGY EXAM  07/07/2017  . FOOT EXAM  06/27/2018  . INFLUENZA VACCINE  01/25/2019  . HEMOGLOBIN A1C  06/18/2019  . PNA vac Low Risk Adult  Completed   Fall Risk  10/02/2018 09/11/2018 08/28/2018 08/08/2018 08/07/2018  Falls in the past year? 0 0 0 0 0  Number falls in past yr: 0 0 0 - 0  Injury with Fall? 0 0 0 - 0  Risk for fall due to : - - - - -   Functional Status Survey:    Vitals:   02/17/19 1301  BP: 130/68  Pulse: 70  Resp: 20  Temp: (!) 97.1 F (36.2 C)  SpO2: 94%  Weight: 122 lb 3.2 oz (55.4 kg)  Height: 5\' 8"  (1.727 m)   Body mass index is 18.58 kg/m.  Physical Exam  Labs reviewed: Recent Labs    02/08/19 0245 02/09/19 0311 02/10/19 0402  NA 129* 129* 128*  K 4.7 4.4 4.5  CL 94* 98 95*  CO2 22 23 25   GLUCOSE 242* 113* 176*  BUN 20 27* 21  CREATININE 1.22 1.21 0.98  CALCIUM 8.5* 8.2* 8.1*   Recent Labs    08/29/18 0815 10/09/18 1352 10/09/18 1920 10/17/18 10/22/18  AST 15 23 22 22 16   ALT 13 22 21 14 11   ALKPHOS  --  64 68 67 69  BILITOT 0.7 0.8 0.7  --   --   PROT 6.0* 6.3* 6.2* 4.6  --   ALBUMIN  --  2.5* 2.5* 2.2  --    Recent Labs    04/01/18 1432  08/29/18 0815  02/07/19 1233 02/08/19 0245 02/09/19 0311  WBC 8.9   < > 11.7*   < > 9.7 14.3* 14.5*  NEUTROABS 6.3  --  8,471*  --   --   --  10.9*  HGB 12.7*   < > 12.0*   < > 11.8* 11.6* 9.5*  HCT 38.4*   < > 34.5*   < > 34.4* 33.4* 27.5*  MCV 104.9*   < > 99.7   < > 97.5 96.8 96.8  PLT 317   < > 455*   < > 448* 454* 459*   < > = values in this interval not displayed.   Lab Results  Component Value Date   TSH 1.69 10/24/2018   Lab Results  Component Value Date   HGBA1C 6.8 12/17/2018   Lab Results  Component Value Date   CHOL 126 08/29/2018   HDL 49 08/29/2018   LDLCALC 59  08/29/2018   TRIG 95 08/29/2018   CHOLHDL 2.6 08/29/2018    Significant Diagnostic Results in last 30 days:  No results found.  Assessment/Plan 1. Hypotension, unspecified hypotension type   2. S/P AKA (above knee amputation) bilateral (HCC)   3. Paroxysmal atrial fibrillation ([facility])   4. CKD (chronic kidney disease) stage 3, GFR 30-59 ml/min (HCC)     Family/ staff Communication:   Labs/tests ordered:

## 2019-02-17 NOTE — Progress Notes (Signed)
Location: [facility] Room Number: 683 Place of Service:  SNF (31)  Provider:   Code Status:  Goals of Care:  Advanced Directives 02/11/2019  Does Patient Have a Medical Advance Directive? Yes  Type of Paramedic of [place];Living will;Out of facility DNR (pink MOST or yellow form)  Does patient want to make changes to medical advance directive? No - Patient declined  Copy of [place] in Chart? Yes - validated most recent copy scanned in chart (See row information)  Pre-existing out of facility DNR order (yellow form or pink MOST form) Yellow form placed in chart (order not valid for inpatient use)     Chief Complaint  Patient presents with  . Acute Visit    C/o - Low BP    HPI: Patient is a 85 y.o. male seen today for an acute visit for Low BP. Patient has had few readings of BP running Below 100  Patient has h/o Hypertension, Hyperlipidemia, Diabetes mellitus, h/o Brain stem Infarct s/p TPA In 10/19, Hyponatremia,Atrial Fibrillation on Eliquis since 10/19.,PADs/p Left AKA Readmitted  to SNF for therapy after Undergoing Right AKA on 08/14. Patient has been running low blood pressure in the facility He was also complaining of feeling very weak and fatigued. Repeat Labs in the Facility were Normal Continues to have some pain in that leg but mostly relieved with Tylenol Working with therapy and is getting independent with his transfers. Patient denied any fever or chills.  Denied any chest pain dizziness  Past Medical History:  Diagnosis Date  . BPH (benign prostatic hyperplasia) 10/14/2014  . Cervicalgia 01/04/2016  . Chronic kidney disease   . Contusion of left shoulder 05/27/14  . DM type 2 (diabetes mellitus, type 2) ([facility])   . Dysphagia   . High blood pressure   . History of basal cell cancer 11/21/2005   Right temple  . History of colonic polyps 10/14/2003  . Hyperlipidemia   . Left knee pain  05/27/14  . Peripheral neuropathic pain   . Peripheral vascular disease ([facility])   . SAH (subarachnoid hemorrhage) ([facility]) 2009   TBI  . Stroke ([facility])   . TIA (transient ischemic attack) 11/06/2005   Left eye pain. Slurred speech. Diaphoresis. No residual effects.     Past Surgical History:  Procedure Laterality Date  . ABDOMINAL AORTOGRAM W/LOWER EXTREMITY N/A 06/12/2018   Procedure: ABDOMINAL AORTOGRAM W/LOWER EXTREMITY;  Surgeon: Marty Heck, MD;  Location: [facility];  Service: Cardiovascular;  Laterality: N/A;  . Achilles tendon repair Left 1998   ruptured tendon  . AMPUTATION Left 10/11/2018   Procedure: Left  AMPUTATION ABOVE KNEE;  Surgeon: Marty Heck, MD;  Location: [facility];  Service: Vascular;  Laterality: Left;  . AMPUTATION Right 02/07/2019   Procedure: AMPUTATION ABOVE KNEE;  Surgeon: Rosetta Posner, MD;  Location: [facility];  Service: Vascular;  Laterality: Right;  . COLONOSCOPY  1992   Polyp removed  . COLONOSCOPY  1998   normal  . ORIF FEMUR FRACTURE Right 1929  . PERIPHERAL VASCULAR INTERVENTION Left 06/12/2018   Procedure: PERIPHERAL VASCULAR INTERVENTION;  Surgeon: Marty Heck, MD;  Location: [facility];  Service: Cardiovascular;  Laterality: Left;  Attempted   . TONSILLECTOMY      Allergies  Allergen Reactions  . Tape Other (See Comments)    TAPE WILL TEAR THE SKIN!!!!    Outpatient Encounter Medications as of 02/17/2019  Medication Sig  .  Amino Acids-Protein Hydrolys (FEEDING SUPPLEMENT, PRO-STAT SUGAR FREE 64,) LIQD Take 30 mLs by mouth daily.  Marland Kitchen apixaban (ELIQUIS) 2.5 MG TABS tablet Take 2.5 mg by mouth 2 (two) times daily.  . bisacodyl (DULCOLAX) 10 MG suppository Place 10 mg rectally daily as needed for moderate constipation.  . Calcium Carbonate-Vitamin D 600-400 MG-UNIT tablet Take 1 tablet by mouth daily.  . Cholecalciferol 1000 UNITS tablet Take 1,000 Units by mouth daily.  Marland Kitchen gabapentin (NEURONTIN) 100 MG capsule TAKE  ONE CAPSULE BY MOUTH EVERY NIGHT AT BEDTIME (Patient taking differently: Take 100 mg by mouth at bedtime. )  . losartan (COZAAR) 25 MG tablet Take 25 mg by mouth daily.  . metFORMIN (GLUCOPHAGE) 500 MG tablet Take 1 tablet (500 mg total) by mouth 2 (two) times daily with a meal.  . metoprolol tartrate (LOPRESSOR) 25 MG tablet Take 1 tablet (25 mg total) by mouth 2 (two) times daily.  . Nutritional Supplements (BOOST GLUCOSE CONTROL) LIQD Take 237 mLs by mouth 2 (two) times daily between meals. Twice a day between meals   . oxyCODONE-acetaminophen (PERCOCET/ROXICET) 5-325 MG tablet Take 1 tablet by mouth every 6 (six) hours as needed for severe pain.  Marland Kitchen Propylene Glycol-Glycerin 1-0.3 % SOLN Place 2 drops into both eyes daily as needed (dry eyes).   . saccharomyces boulardii (FLORASTOR) 250 MG capsule Take 250 mg by mouth 2 (two) times daily.  . sennosides-docusate sodium (SENOKOT-S) 8.6-50 MG tablet Take 1 tablet by mouth daily.  . simvastatin (ZOCOR) 10 MG tablet TAKE ONE TABLET BY MOUTH DAILY (Patient taking differently: Take 10 mg by mouth every evening. )  . tamsulosin (FLOMAX) 0.4 MG CAPS capsule Take 1 capsule (0.4 mg total) by mouth daily after supper.  . zinc oxide 20 % ointment Apply 1 application topically as needed for irritation. Apply to buttocks after every incontinent episode and as needed for redness  . [DISCONTINUED] acetaminophen (TYLENOL 8 HOUR) 650 MG CR tablet Take 1 tablet (650 mg total) by mouth every 8 (eight) hours as needed for pain.   No facility-administered encounter medications on file as of 02/17/2019.     Review of Systems:  Review of Systems  Constitutional: Negative.   HENT: Negative.   Respiratory: Positive for cough.   Cardiovascular: Negative.   Gastrointestinal: Negative.   Genitourinary: Negative.   Musculoskeletal: Positive for arthralgias.  Skin: Negative.   Neurological: Positive for weakness.  Psychiatric/Behavioral: Negative.   All other systems  reviewed and are negative.   Health Maintenance  Topic Date Due  . OPHTHALMOLOGY EXAM  07/07/2017  . FOOT EXAM  06/27/2018  . INFLUENZA VACCINE  01/25/2019  . HEMOGLOBIN A1C  06/18/2019  . TETANUS/TDAP  04/29/2027  . PNA vac Low Risk Adult  Completed    Physical Exam: Vitals:   02/17/19 1301  BP: 130/68  Pulse: 70  Resp: 20  Temp: (!) 97.1 F (36.2 C)  SpO2: 94%  Weight: 122 lb 3.2 oz (55.4 kg)  Height: 5\' 8"  (1.727 m)   Body mass index is 18.58 kg/m. Physical Exam Constitutional: Oriented to person, place, and time. Well-developed and well-nourished.  HENT:  Head: Normocephalic.  Mouth/Throat: Oropharynx is clear and moist.  Eyes: Pupils are equal, round, and reactive to light.  Neck: Neck supple.  Cardiovascular: Normal rate and normal heart sounds.  Positive for  murmur heard. Pulmonary/Chest: Effort normal and breath sounds normal. No respiratory distress. No wheezes. She has no rales.  Abdominal: Soft. Bowel sounds are normal. No  distension. There is no tenderness. There is no rebound.  Musculoskeletal:S/P Bilateral AKA Lymphadenopathy: none Neurological: Alert and oriented to person, place, and time.  Skin: Skin is warm and dry.  Psychiatric: Normal mood and affect. Behavior is normal. Thought content normal.   Labs reviewed: Basic Metabolic Panel: Recent Labs    10/24/18  02/08/19 0245 02/09/19 0311 02/10/19 0402  NA  --    < > 129* 129* 128*  K  --    < > 4.7 4.4 4.5  CL  --    < > 94* 98 95*  CO2  --    < > 22 23 25   GLUCOSE  --    < > 242* 113* 176*  BUN  --    < > 20 27* 21  CREATININE  --    < > 1.22 1.21 0.98  CALCIUM  --    < > 8.5* 8.2* 8.1*  TSH 1.69  --   --   --   --    < > = values in this interval not displayed.   Liver Function Tests: Recent Labs    08/29/18 0815 10/09/18 1352 10/09/18 1920 10/17/18 10/22/18  AST 15 23 22 22 16   ALT 13 22 21 14 11   ALKPHOS  --  64 68 67 69  BILITOT 0.7 0.8 0.7  --   --   PROT 6.0* 6.3* 6.2*  4.6  --   ALBUMIN  --  2.5* 2.5* 2.2  --    No results for input(s): LIPASE, AMYLASE in the last 8760 hours. No results for input(s): AMMONIA in the last 8760 hours. CBC: Recent Labs    04/01/18 1432  08/29/18 0815  02/07/19 1233 02/08/19 0245 02/09/19 0311  WBC 8.9   < > 11.7*   < > 9.7 14.3* 14.5*  NEUTROABS 6.3  --  8,471*  --   --   --  10.9*  HGB 12.7*   < > 12.0*   < > 11.8* 11.6* 9.5*  HCT 38.4*   < > 34.5*   < > 34.4* 33.4* 27.5*  MCV 104.9*   < > 99.7   < > 97.5 96.8 96.8  PLT 317   < > 455*   < > 448* 454* 459*   < > = values in this interval not displayed.   Lipid Panel: Recent Labs    03/13/18 0000 04/02/18 0259 08/29/18 0815  CHOL 154 143 126  HDL 57 57 49  LDLCALC 78 80 59  TRIG 104 29 95  CHOLHDL 2.7 2.5 2.6   Lab Results  Component Value Date   HGBA1C 6.8 12/17/2018    Procedures since last visit: No results found.  Assessment/Plan 1. Hypotension, unspecified hypotension type Discontinue Cozaar and Flomax Also on LOpressor  2. S/P AKA (above knee amputation) bilateral ([facility]) Doing well with therapy Getting Independent in his transfers  3. Paroxysmal atrial fibrillation (HCC) Rate Controlled on Lopressor  4. CKD (chronic kidney disease) stage 3, GFR 30-59 ml/min (HCC) Repeat BUN and Creat were stable Anemia Hgb 9.5 in facility Conitnue Iron Urinary Retension Resolved Discontinue Flomax Leucocytosis Resolved Hyponatremia Repeat Sodium is 129  Labs/tests ordered:  BMP and CBC in 1 week  Total time spent in this patient care encounter was  _25  minutes; greater than 50% of the visit spent counseling patient and staff, reviewing records , Labs and coordinating care for problems addressed at this encounter.

## 2019-02-20 ENCOUNTER — Encounter: Payer: Self-pay | Admitting: Internal Medicine

## 2019-02-20 NOTE — Progress Notes (Signed)
A user error has taken place.

## 2019-02-25 LAB — BASIC METABOLIC PANEL
BUN: 22 — AB (ref 4–21)
CO2: 29 — AB (ref 13–22)
Chloride: 95 — AB (ref 99–108)
Creatinine: 0.9 (ref 0.6–1.3)
Glucose: 115
Potassium: 4.3 (ref 3.4–5.3)
Sodium: 129 — AB (ref 137–147)

## 2019-02-25 LAB — CBC AND DIFFERENTIAL
HCT: 32 — AB (ref 41–53)
Hemoglobin: 10.8 — AB (ref 13.5–17.5)
Neutrophils Absolute: 3420
Platelets: 494 — AB (ref 150–399)
WBC: 7.5

## 2019-02-25 LAB — COMPREHENSIVE METABOLIC PANEL: Calcium: 8.9 (ref 8.7–10.7)

## 2019-02-25 LAB — CBC: RBC: 3.21 — AB (ref 3.87–5.11)

## 2019-03-04 ENCOUNTER — Encounter: Payer: Self-pay | Admitting: Nurse Practitioner

## 2019-03-04 DIAGNOSIS — W19XXXA Unspecified fall, initial encounter: Secondary | ICD-10-CM | POA: Insufficient documentation

## 2019-03-05 ENCOUNTER — Encounter: Payer: Self-pay | Admitting: Nurse Practitioner

## 2019-03-05 ENCOUNTER — Non-Acute Institutional Stay (SKILLED_NURSING_FACILITY): Payer: Medicare Other | Admitting: Nurse Practitioner

## 2019-03-05 DIAGNOSIS — I480 Paroxysmal atrial fibrillation: Secondary | ICD-10-CM | POA: Diagnosis not present

## 2019-03-05 DIAGNOSIS — E871 Hypo-osmolality and hyponatremia: Secondary | ICD-10-CM

## 2019-03-05 DIAGNOSIS — I129 Hypertensive chronic kidney disease with stage 1 through stage 4 chronic kidney disease, or unspecified chronic kidney disease: Secondary | ICD-10-CM

## 2019-03-05 DIAGNOSIS — M792 Neuralgia and neuritis, unspecified: Secondary | ICD-10-CM

## 2019-03-05 DIAGNOSIS — E1122 Type 2 diabetes mellitus with diabetic chronic kidney disease: Secondary | ICD-10-CM

## 2019-03-05 DIAGNOSIS — W19XXXA Unspecified fall, initial encounter: Secondary | ICD-10-CM

## 2019-03-05 DIAGNOSIS — D500 Iron deficiency anemia secondary to blood loss (chronic): Secondary | ICD-10-CM

## 2019-03-05 DIAGNOSIS — N183 Chronic kidney disease, stage 3 (moderate): Secondary | ICD-10-CM

## 2019-03-05 NOTE — Progress Notes (Addendum)
Location:  [place] Room Number: 35 Place of Service:  SNF (31) Provider:  Marlana Latus  NP  Virgie Dad, MD  Patient Care Team: Virgie Dad, MD as PCP - General (Internal Medicine) Buford Dresser, MD as PCP - Cardiology (Cardiology) Ricard Dillon, MD as Consulting Physician (Internal Medicine)  Extended Emergency Contact Information Primary Emergency Contact: Gordon,[place] Address: [address]          [place], [place] [address] [name] of [place] Phone: [phone] Mobile Phone: [phone] Relation: Daughter  Code Status:  DNR Goals of care: Advanced Directive information Advanced Directives 03/05/2019  Does Patient Have a Medical Advance Directive? Yes  Type of Paramedic of [place];Living will;Out of facility DNR (pink MOST or yellow form)  Does patient want to make changes to medical advance directive? No - Patient declined  Copy of [place] in Chart? Yes - validated most recent copy scanned in chart (See row information)  Pre-existing out of facility DNR order (yellow form or pink MOST form) Pink MOST/Yellow Form most recent copy in chart - Physician notified to receive inpatient order;Yellow form placed in chart (order not valid for inpatient use)     Chief Complaint  Patient presents with   Medical Management of Chronic Issues    HPI:  Pt is a 85 y.o. male seen today for medical management of chronic diseases.    The patient has Hx of hyponatremia, baseline serum Na 127-129, last Na 128 02/10/19. Post op anemia, Hgb 9.5 02/09/19, on Fe 3x/wk. S/p R+L AKA, slid out of bed 03/02/19 w/o injury, he is aware of safety, uses belt in w/c. Constipation, stable, on Senokot S II qhs. HTN, blood pressure is controlled, on Metoprolol 25mg  bid. T2DM, blood sugar is controlled, on Metformin 500mg  bid. Peripheral neuropathy, stable, on Gabapentin 100mg  qd. Afib, heart rate is in control,  on Eliquis 2.5mg  bid.    Past Medical History:  Diagnosis Date   BPH (benign prostatic hyperplasia) 10/14/2014   Cervicalgia 01/04/2016   Chronic kidney disease    Contusion of left shoulder 05/27/14   DM type 2 (diabetes mellitus, type 2) ([place])    Dysphagia    High blood pressure    History of basal cell cancer 11/21/2005   Right temple   History of colonic polyps 10/14/2003   Hyperlipidemia    Left knee pain 05/27/14   Peripheral neuropathic pain    Peripheral vascular disease ([place])    SAH (subarachnoid hemorrhage) ([place]) 2009   TBI   Stroke [facility])    TIA (transient ischemic attack) 11/06/2005   Left eye pain. Slurred speech. Diaphoresis. No residual effects.    Past Surgical History:  Procedure Laterality Date   ABDOMINAL AORTOGRAM W/LOWER EXTREMITY N/A 06/12/2018   Procedure: ABDOMINAL AORTOGRAM W/LOWER EXTREMITY;  Surgeon: Marty Heck, MD;  Location: [facility] CV [facility];  Service: Cardiovascular;  Laterality: N/A;   Achilles tendon repair Left 1998   ruptured tendon   AMPUTATION Left 10/11/2018   Procedure: Left  AMPUTATION ABOVE KNEE;  Surgeon: Marty Heck, MD;  Location: [facility];  Service: Vascular;  Laterality: Left;   AMPUTATION Right 02/07/2019   Procedure: AMPUTATION ABOVE KNEE;  Surgeon: Rosetta Posner, MD;  Location: [place] [facility];  Service: Vascular;  Laterality: Right;   COLONOSCOPY  1992   Polyp removed   COLONOSCOPY  1998   normal   ORIF [place]  VASCULAR INTERVENTION Left 06/12/2018   Procedure: PERIPHERAL VASCULAR INTERVENTION;  Surgeon: Marty Heck, MD;  Location: [facility];  Service: Cardiovascular;  Laterality: Left;  Attempted    TONSILLECTOMY      Allergies  Allergen Reactions   Tape Other (See Comments)    TAPE WILL TEAR THE SKIN!!!!    Outpatient Encounter Medications as of 03/05/2019  Medication Sig   acetaminophen (ACETAMINOPHEN 8 HOUR) 650 MG CR tablet Take 650 mg by  mouth every 8 (eight) hours as needed for pain.   Amino Acids-Protein Hydrolys (FEEDING SUPPLEMENT, PRO-STAT SUGAR FREE 64,) LIQD Take 30 mLs by mouth daily.   apixaban (ELIQUIS) 2.5 MG TABS tablet Take 2.5 mg by mouth 2 (two) times daily.   bisacodyl (DULCOLAX) 10 MG suppository Place 10 mg rectally daily as needed for moderate constipation.   Calcium Carbonate-Vitamin D 600-400 MG-UNIT tablet Take 1 tablet by mouth daily.   Cholecalciferol 1000 UNITS tablet Take 1,000 Units by mouth daily.   ferrous sulfate 325 (65 FE) MG tablet Take 325 mg by mouth. Once on Mon, Wed and Fri with meals.   gabapentin (NEURONTIN) 100 MG capsule TAKE ONE CAPSULE BY MOUTH EVERY NIGHT AT BEDTIME (Patient taking differently: Take 100 mg by mouth at bedtime. )   metFORMIN (GLUCOPHAGE) 500 MG tablet Take 1 tablet (500 mg total) by mouth 2 (two) times daily with a meal.   metoprolol tartrate (LOPRESSOR) 25 MG tablet Take 1 tablet (25 mg total) by mouth 2 (two) times daily.   Nutritional Supplements (BOOST GLUCOSE CONTROL) LIQD Take 237 mLs by mouth 2 (two) times daily between meals. Twice a day between meals    oxyCODONE-acetaminophen (PERCOCET/ROXICET) 5-325 MG tablet Take 1 tablet by mouth every 6 (six) hours as needed for severe pain. (Patient taking differently: Take 1 tablet by mouth every 8 (eight) hours as needed for severe pain. )   Propylene Glycol-Glycerin 1-0.3 % SOLN Place 2 drops into both eyes daily as needed (dry eyes).    saccharomyces boulardii (FLORASTOR) 250 MG capsule Take 250 mg by mouth 2 (two) times daily.   sennosides-docusate sodium (SENOKOT-S) 8.6-50 MG tablet Take 2 tablets by mouth at bedtime.    simvastatin (ZOCOR) 10 MG tablet TAKE ONE TABLET BY MOUTH DAILY (Patient taking differently: Take 10 mg by mouth every evening. )   [DISCONTINUED] losartan (COZAAR) 25 MG tablet Take 25 mg by mouth daily.   [DISCONTINUED] tamsulosin (FLOMAX) 0.4 MG CAPS capsule Take 1 capsule (0.4 mg  total) by mouth daily after supper.   No facility-administered encounter medications on file as of 03/05/2019.     Review of Systems  Constitutional: Negative for activity change, appetite change, chills, diaphoresis, fatigue and fever.  HENT: Positive for hearing loss. Negative for congestion and voice change.   Respiratory: Negative for cough, shortness of breath and wheezing.   Cardiovascular: Negative for chest pain, palpitations and leg swelling.  Gastrointestinal: Negative for abdominal distention, constipation, diarrhea, nausea and vomiting.  Genitourinary: Negative for difficulty urinating, dysuria and urgency.  Musculoskeletal: Positive for arthralgias and gait problem.       S/p R+L AKA  Skin: Negative for color change and pallor.  Neurological: Negative for dizziness, speech difficulty, weakness and headaches.  Psychiatric/Behavioral: Negative for agitation, hallucinations and sleep disturbance. The patient is not nervous/anxious.     Immunization History  Administered Date(s) Administered   DTaP 12/31/2012   Influenza, High Dose Seasonal PF 04/04/2017   Influenza,inj,Quad PF,6+ Mos 03/28/2018  Influenza-Unspecified 04/10/2014, 03/25/2015, 04/06/2016   PPD Test 04/29/2014   Pneumococcal Conjugate-13 04/28/2017   Pneumococcal-Unspecified 03/13/2011   Td 12/24/2012   Tdap 04/28/2017   Zoster Recombinat (Shingrix) 12/14/2017   Pertinent  Health Maintenance Due  Topic Date Due   OPHTHALMOLOGY EXAM  07/07/2017   FOOT EXAM  06/27/2018   INFLUENZA VACCINE  01/25/2019   URINE MICROALBUMIN  03/14/2019   HEMOGLOBIN A1C  06/18/2019   PNA vac Low Risk Adult  Completed   Fall Risk  10/02/2018 09/11/2018 08/28/2018 08/08/2018 08/07/2018  Falls in the past year? 0 0 0 0 0  Number falls in past yr: 0 0 0 - 0  Injury with Fall? 0 0 0 - 0  Risk for fall due to : - - - - -   Functional Status Survey:    Vitals:   03/05/19 0943  BP: (!) 106/57  Pulse: 69  Resp: 18   Temp: (!) 97.5 F (36.4 C)  SpO2: 94%  Weight: 106 lb (48.1 kg)  Height: 5\' 8"  (1.727 m)   Body mass index is 16.12 kg/m. Physical Exam Vitals signs and nursing note reviewed.  Constitutional:      General: He is not in acute distress.    Appearance: Normal appearance. He is not ill-appearing, toxic-appearing or diaphoretic.  HENT:     Head: Normocephalic and atraumatic.     Nose: Nose normal.     Mouth/Throat:     Mouth: Mucous membranes are moist.  Eyes:     Extraocular Movements: Extraocular movements intact.     Conjunctiva/sclera: Conjunctivae normal.     Pupils: Pupils are equal, round, and reactive to light.  Neck:     Musculoskeletal: Normal range of motion and neck supple.  Cardiovascular:     Rate and Rhythm: Normal rate and regular rhythm.     Heart sounds: Murmur present.  Pulmonary:     Breath sounds: No wheezing, rhonchi or rales.  Abdominal:     General: Bowel sounds are normal.     Palpations: Abdomen is soft.     Tenderness: There is no abdominal tenderness. There is no right CVA tenderness, guarding or rebound.  Musculoskeletal:     Right lower leg: No edema.     Left lower leg: No edema.     Comments: Transport planner for mobility.  Skin:    General: Skin is warm and dry.  Neurological:     General: No focal deficit present.     Mental Status: He is alert. Mental status is at baseline.     Cranial Nerves: No cranial nerve deficit.     Motor: No weakness.     Coordination: Coordination normal.     Gait: Gait abnormal.     Comments: Oriented to person, place.   Psychiatric:        Mood and Affect: Mood normal.        Behavior: Behavior normal.        Thought Content: Thought content normal.     Labs reviewed: Recent Labs    02/08/19 0245 02/09/19 0311 02/10/19 0402  NA 129* 129* 128*  K 4.7 4.4 4.5  CL 94* 98 95*  CO2 22 23 25   GLUCOSE 242* 113* 176*  BUN 20 27* 21  CREATININE 1.22 1.21 0.98  CALCIUM 8.5* 8.2* 8.1*   Recent Labs      08/29/18 0815 10/09/18 1352 10/09/18 1920 10/17/18 10/22/18  AST 15 23 22 22 16   ALT 13 22  21 14 11   ALKPHOS  --  64 68 67 69  BILITOT 0.7 0.8 0.7  --   --   PROT 6.0* 6.3* 6.2* 4.6  --   ALBUMIN  --  2.5* 2.5* 2.2  --    Recent Labs    04/01/18 1432  08/29/18 0815  02/07/19 1233 02/08/19 0245 02/09/19 0311  WBC 8.9   < > 11.7*   < > 9.7 14.3* 14.5*  NEUTROABS 6.3  --  8,471*  --   --   --  10.9*  HGB 12.7*   < > 12.0*   < > 11.8* 11.6* 9.5*  HCT 38.4*   < > 34.5*   < > 34.4* 33.4* 27.5*  MCV 104.9*   < > 99.7   < > 97.5 96.8 96.8  PLT 317   < > 455*   < > 448* 454* 459*   < > = values in this interval not displayed.   Lab Results  Component Value Date   TSH 1.69 10/24/2018   Lab Results  Component Value Date   HGBA1C 6.8 12/17/2018   Lab Results  Component Value Date   CHOL 126 08/29/2018   HDL 49 08/29/2018   LDLCALC 59 08/29/2018   TRIG 95 08/29/2018   CHOLHDL 2.6 08/29/2018    Significant Diagnostic Results in last 30 days:  No results found.  Assessment/Plan Paroxysmal atrial fibrillation (HCC) Heart rate is in control, continue Eliquis 2.5mg  bid.   Benign hypertension with chronic kidney disease, stage III (HCC) Blood pressure is controlled, continue Metoprolol 25mg  bid   Controlled type 2 diabetes mellitus with stage 3 chronic kidney disease, without long-term current use of insulin (HCC) Controlled, continue Metformin 500mg  bid.   Neuropathic pain Controlled, continue Gabapentin 100mg  qd.   Hyponatremia At his baseline, last serum Na 128 02/10/19, 129 02/11/19  Fall 03/02/19 slid to the floor.   Blood loss anemia Continue Fe 3x/wk. Stable.      Family/ staff Communication: plan of care reviewed with the patient and charge nurse.   Labs/tests ordered: none  Time spend 25 minutes.

## 2019-03-05 NOTE — Assessment & Plan Note (Signed)
At his baseline, last serum Na 128 02/10/19, 129 02/11/19

## 2019-03-05 NOTE — Assessment & Plan Note (Signed)
03/02/19 slid to the floor.

## 2019-03-05 NOTE — Assessment & Plan Note (Signed)
Continue Fe 3x/wk. Stable.

## 2019-03-05 NOTE — Assessment & Plan Note (Signed)
Blood pressure is controlled, continue Metoprolol 25mg bid.  

## 2019-03-05 NOTE — Assessment & Plan Note (Signed)
Heart rate is in control, continue Eliquis 2.5mg bid  

## 2019-03-05 NOTE — Assessment & Plan Note (Signed)
Controlled, continue Gabapentin 100mg  qd.

## 2019-03-05 NOTE — Assessment & Plan Note (Signed)
Controlled, continue Metformin 500mg bid.  

## 2019-03-10 NOTE — Progress Notes (Signed)
POST OPERATIVE OFFICE NOTE    CC:  F/u for surgery  HPI:  This is a 85 y.o. male who is s/p right AKA on 02/07/2019 by Dr. Donnetta Hutching.  He had previously undergone a left AKA on 10/11/2018 by Dr. Carlis Abbott.  He returns today for follow up.    He states he is doing well and has not had any issues.  Inquires about prosthesis.    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   Allergies  Allergen Reactions   Tape Other (See Comments)    TAPE WILL TEAR THE SKIN!!!!    Current Outpatient Medications  Medication Sig Dispense Refill   acetaminophen (ACETAMINOPHEN 8 HOUR) 650 MG CR tablet Take 650 mg by mouth every 8 (eight) hours as needed for pain.     Amino Acids-Protein Hydrolys (FEEDING SUPPLEMENT, PRO-STAT SUGAR FREE 64,) LIQD Take 30 mLs by mouth daily.     apixaban (ELIQUIS) 2.5 MG TABS tablet Take 2.5 mg by mouth 2 (two) times daily.     bisacodyl (DULCOLAX) 10 MG suppository Place 10 mg rectally daily as needed for moderate constipation.     Calcium Carbonate-Vitamin D 600-400 MG-UNIT tablet Take 1 tablet by mouth daily.     Cholecalciferol 1000 UNITS tablet Take 1,000 Units by mouth daily.     ferrous sulfate 325 (65 FE) MG tablet Take 325 mg by mouth. Once on Mon, Wed and Fri with meals.     gabapentin (NEURONTIN) 100 MG capsule TAKE ONE CAPSULE BY MOUTH EVERY NIGHT AT BEDTIME (Patient taking differently: Take 100 mg by mouth at bedtime. ) 30 capsule 0   metFORMIN (GLUCOPHAGE) 500 MG tablet Take 1 tablet (500 mg total) by mouth 2 (two) times daily with a meal. 180 tablet 1   metoprolol tartrate (LOPRESSOR) 25 MG tablet Take 1 tablet (25 mg total) by mouth 2 (two) times daily. 180 tablet 1   Nutritional Supplements (BOOST GLUCOSE CONTROL) LIQD Take 237 mLs by mouth 2 (two) times daily between meals. Twice a day between meals      oxyCODONE-acetaminophen (PERCOCET/ROXICET) 5-325 MG tablet Take 1 tablet by mouth every 6 (six) hours as needed for severe pain. (Patient  taking differently: Take 1 tablet by mouth every 8 (eight) hours as needed for severe pain. ) 10 tablet 0   Propylene Glycol-Glycerin 1-0.3 % SOLN Place 2 drops into both eyes daily as needed (dry eyes).      saccharomyces boulardii (FLORASTOR) 250 MG capsule Take 250 mg by mouth 2 (two) times daily.     sennosides-docusate sodium (SENOKOT-S) 8.6-50 MG tablet Take 2 tablets by mouth at bedtime.      simvastatin (ZOCOR) 10 MG tablet TAKE ONE TABLET BY MOUTH DAILY (Patient taking differently: Take 10 mg by mouth every evening. ) 90 tablet 0   No current facility-administered medications for this visit.      ROS:  See HPI  Physical Exam:  Today's Vitals   03/11/19 1430  BP: 132/70  Pulse: 68  Resp: 16  Temp: 97.8 F (36.6 C)  TempSrc: Temporal  SpO2: 97%  Weight: 106 lb (48.1 kg)  Height: 5\' 8"  (1.727 m)   Body mass index is 16.12 kg/m.   Incision:  Nicely healed.  There is some pink coloring around a few of the staples but nothing appears infectious.  Extremities:  Good ROM of stump   Assessment/Plan:  This is a 85 y.o. male who is s/p: right  AKA on 02/07/2019 by Dr. Donnetta Hutching.  He had previously undergone a left AKA on 10/11/2018 by Dr. Carlis Abbott.  -pt doing well post right AKA.  Will remove staples today.   -he did inquire about prosthesis.  I discussed with he and his daughter that given he does not have a knee joint and has bilateral AKA, he most likely will not be a candidate for this.  Did let he and his daughter know they could stop by [facility] to discuss this with them.  His daughter also states most likely won't work as he does not have the upper body strength.  -there is some mild redness around a few staples, but most likely reaction to the staples as this does not appear infected at all.  -he will f/u as needed.    Leontine Locket, PA-C [facility] [phone]  Clinic MD:  Scot Dock (on call MD)

## 2019-03-11 ENCOUNTER — Ambulatory Visit (INDEPENDENT_AMBULATORY_CARE_PROVIDER_SITE_OTHER): Payer: Self-pay | Admitting: Physician Assistant

## 2019-03-11 ENCOUNTER — Other Ambulatory Visit: Payer: Self-pay

## 2019-03-11 VITALS — BP 132/70 | HR 68 | Temp 97.8°F | Resp 16 | Ht 68.0 in | Wt 106.0 lb

## 2019-03-11 DIAGNOSIS — Z89611 Acquired absence of right leg above knee: Secondary | ICD-10-CM

## 2019-03-25 ENCOUNTER — Encounter: Payer: Self-pay | Admitting: Adult Health

## 2019-03-25 ENCOUNTER — Other Ambulatory Visit: Payer: Self-pay

## 2019-03-25 ENCOUNTER — Ambulatory Visit (INDEPENDENT_AMBULATORY_CARE_PROVIDER_SITE_OTHER): Payer: Medicare Other | Admitting: Adult Health

## 2019-03-25 VITALS — BP 100/64 | HR 72 | Temp 97.1°F

## 2019-03-25 DIAGNOSIS — E1122 Type 2 diabetes mellitus with diabetic chronic kidney disease: Secondary | ICD-10-CM

## 2019-03-25 DIAGNOSIS — I480 Paroxysmal atrial fibrillation: Secondary | ICD-10-CM | POA: Diagnosis not present

## 2019-03-25 DIAGNOSIS — I10 Essential (primary) hypertension: Secondary | ICD-10-CM | POA: Diagnosis not present

## 2019-03-25 DIAGNOSIS — I63412 Cerebral infarction due to embolism of left middle cerebral artery: Secondary | ICD-10-CM

## 2019-03-25 DIAGNOSIS — N183 Chronic kidney disease, stage 3 (moderate): Secondary | ICD-10-CM

## 2019-03-25 DIAGNOSIS — E785 Hyperlipidemia, unspecified: Secondary | ICD-10-CM

## 2019-03-25 NOTE — Patient Instructions (Signed)
Continue Eliquis (apixaban) daily  and Zocor  for secondary stroke prevention  Continue to follow up with PCP regarding cholesterol, blood pressure and diabetes management   Continue to monitor blood pressure at home  Maintain strict control of hypertension with blood pressure goal below 130/90, diabetes with hemoglobin A1c goal below 6.5% and cholesterol with LDL cholesterol (bad cholesterol) goal below 70 mg/dL. I also advised the patient to eat a healthy diet with plenty of whole grains, cereals, fruits and vegetables, exercise regularly and maintain ideal body weight.         Thank you for coming to see [place] at [facility]. I hope we have been able to provide you high quality care today.  You may receive a patient satisfaction survey over the next few weeks. We would appreciate your feedback and comments so that we may continue to improve ourselves and the health of our patients.

## 2019-03-25 NOTE — Progress Notes (Signed)
[facility] [address] [place]. [place] [address] [phone]       OFFICE FOLLOW UP NOTE  [name] Date of Birth:  Dec 02, 1921 Medical Record Number:  [id number]   Reason for Referral:  hospital stroke follow up  CHIEF COMPLAINT:  Chief Complaint  Patient presents with  . Follow-up    6 mon f/u. Daughter present. Rm 9. No new concerns at this time.     HPI: Update 03/25/2019: Nathan Cook is being seen today for stroke follow-up accompanied by his daughter.  He has been stable from a neurological standpoint. Voice has improved as far as volume with only occasional slurring.  Continues on Eliquis 2.5 mg twice daily and atorvastatin for secondary stroke prevention without side effects.  Blood pressure today 100/64.  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  Recovering well.  He continues to reside at [facility].  No further concerns at this time.  Update 08/08/2018: Nathan Cook is a 85 year old male who is being seen today for routine follow-up after left central medullary infarct in 04/01/2018 and is accompanied by his daughter.  He continues to reside at [facility].  He has been stable from a stroke standpoint with residual mild dysarthria.  He continues on Eliquis without side effects of bleeding or bruising.  Continues on atorvastatin without side effects myalgias.  Blood pressure today 128/76. He greatest complaint today is bilateral lower extremity edema due to peripheral arterial disease. He has a follow up appt scheduled with cardiology on 08/15/2018.  He does not currently have follow-up appointment with vascular surgery as it was recommended to follow-up as needed.  He does endorse increased swelling has been affecting his walking and limiting mobility.  Denies new or worsening stroke/TIA symptoms.   HISTORY SUMMARY:  Nathan Cook is being seen today for initial visit in the office for left  central medullary infarct s/p IV TPA likely due to left VA occlusion from newly diagnosed AF on 04/01/2018. History obtained from patient, daughter and chart review. Reviewed all radiology images and labs personally.  Nathan Cook a 85 y.o.malewith history of HTN, HLD, DM, PVD who was admitted for acute onsetdifficulty speech andswallowing,left-sidedataxia.  CT had reviewed  and was negative for acute abnormality therefore TPA was administered.  CTA head and neck showed left VA occlusion at V4 segment and left PICA occlusion.  MRI brain reviewed showed left central medullary infarct along with left posterior temporal small SAH.  Per notes, after retrospective review of CT head, SAH present on initial CT had prior to TPA without evidence of change post TPA.  Repeat CT head on 04/06/2018 showed stable SAH.  Repeat CT had on 04/09/2018 showed stable left medullary subacute infarct with interval dispersion of small volume hemorrhage left temporal lobe and SAH left temporal convexity.  2D echo showed an EF of 60 to 65%.  Patient was found to be in PAF with RVR on telemetry and is recommended to initiate aspirin 81 mg and to start Eliquis in 1 week as long as he remains neurologically stable.  LDL 80 and recommended increasing simvastatin from 10 mg to 20 mg.  HTN stable during admission and recommended BP goal normotensive range.  A1c 7.6 and recommended tight glycemic control with close PCP follow-up for continued DM management.  Therapies recommended discharge to SNF due to continued deficits and was discharged in stable condition.  Patient is being  seen today for hospital follow-up and is accompanied by his daughter.  He does continue to have dysarthria but has been improving.  He no longer has difficulty with swallowing and is currently on a regular diet.  He continues to participate in PT/OT/ST at [facility].  He was previously residing at [facility] and plans on  returning once he is stable.  He is currently using wheelchair but is able to ambulate during PT sessions with rolling walker due to continued right-sided ataxia.  He has been started on Eliquis and denies bleeding or bruising.  Continues simvastatin without side effects myalgias.  Blood pressure today satisfactory 118/62.  Glucose levels have been fluctuating but typically are not greater than 140.  No further concerns at this time.  Denies new or worsening stroke/TIA symptoms.   ROS:   14 system review of systems performed and negative with exception of speech difficulty  PMH:  Past Medical History:  Diagnosis Date  . BPH (benign prostatic hyperplasia) 10/14/2014  . Cervicalgia 01/04/2016  . Chronic kidney disease   . Contusion of left shoulder 05/27/14  . DM type 2 (diabetes mellitus, type 2) ([place])   . Dysphagia   . High blood pressure   . History of basal cell cancer 11/21/2005   Right temple  . History of colonic polyps 10/14/2003  . Hyperlipidemia   . Left knee pain 05/27/14  . Peripheral neuropathic pain   . Peripheral vascular disease ([place])   . SAH (subarachnoid hemorrhage) ([place]) 2009   TBI  . Stroke ([place])   . TIA (transient ischemic attack) 11/06/2005   Left eye pain. Slurred speech. Diaphoresis. No residual effects.     PSH:  Past Surgical History:  Procedure Laterality Date  . ABDOMINAL AORTOGRAM W/LOWER EXTREMITY N/A 06/12/2018   Procedure: ABDOMINAL AORTOGRAM W/LOWER EXTREMITY;  Surgeon: Marty Heck, MD;  Location: [facility];  Service: Cardiovascular;  Laterality: N/A;  . Achilles tendon repair Left 1998   ruptured tendon  . AMPUTATION Left 10/11/2018   Procedure: Left  AMPUTATION ABOVE KNEE;  Surgeon: Marty Heck, MD;  Location: [facility];  Service: Vascular;  Laterality: Left;  . AMPUTATION Right 02/07/2019   Procedure: AMPUTATION ABOVE KNEE;  Surgeon: Rosetta Posner, MD;  Location: [facility];  Service: Vascular;  Laterality: Right;  . COLONOSCOPY   1992   Polyp removed  . COLONOSCOPY  1998   normal  . ORIF FEMUR FRACTURE Right 1929  . PERIPHERAL VASCULAR INTERVENTION Left 06/12/2018   Procedure: PERIPHERAL VASCULAR INTERVENTION;  Surgeon: Marty Heck, MD;  Location: [facility];  Service: Cardiovascular;  Laterality: Left;  Attempted   . TONSILLECTOMY      Social History:  Social History   Socioeconomic History  . Marital status: Married    Spouse name: Not on file  . Number of children: Not on file  . Years of education: Not on file  . Highest education level: Not on file  Occupational History  . Occupation: retired Copy  . Financial resource strain: Not on file  . Food insecurity    Worry: Not on file    Inability: Not on file  . Transportation needs    Medical: Not on file    Non-medical: Not on file  Tobacco Use  . Smoking status: Former Smoker    Quit date: 06/26/1971    Years since quitting: 47.7  . Smokeless tobacco: Never Used  .  Tobacco comment: 2 1/2 packs a day, until 1972  Substance and Sexual Activity  . Alcohol use: Not Currently    Alcohol/week: 1.0 - 2.0 standard drinks    Types: 1 - 2 Glasses of wine per week    Comment: Wine nightly  . Drug use: No  . Sexual activity: Not on file  Lifestyle  . Physical activity    Days per week: Not on file    Minutes per session: Not on file  . Stress: Not on file  Relationships  . Social Herbalist on phone: Not on file    Gets together: Not on file    Attends religious service: Not on file    Active member of club or organization: Not on file    Attends meetings of clubs or organizations: Not on file    Relationship status: Not on file  . Intimate partner violence    Fear of current or ex partner: Not on file    Emotionally abused: Not on file    Physically abused: Not on file    Forced sexual activity: Not on file  Other Topics Concern  . Not on file  Social History Narrative   Patient lives at  [facility] since Dec 2015   Caffeine- Coffee   Married- Yes, 1951   [employer] 1943-46   House- Apartment with 2 people   Pets- No   Current/past profession- Press photographer   Exercise- Yes, walking   Living will- Yes   DNR- Yes   POA/HPOA- Yes       Family History:  Family History  Problem Relation Age of Onset  . Heart attack Mother   . Heart disease Mother   . Heart disease Father     Medications:   Current Outpatient Medications on File Prior to Visit  Medication Sig Dispense Refill  . acetaminophen (ACETAMINOPHEN 8 HOUR) 650 MG CR tablet Take 650 mg by mouth every 8 (eight) hours as needed for pain.    . Amino Acids-Protein Hydrolys (FEEDING SUPPLEMENT, PRO-STAT SUGAR FREE 64,) LIQD Take 30 mLs by mouth daily.    Marland Kitchen apixaban (ELIQUIS) 2.5 MG TABS tablet Take 2.5 mg by mouth 2 (two) times daily.    . bisacodyl (DULCOLAX) 10 MG suppository Place 10 mg rectally daily as needed for moderate constipation.    . Calcium Carbonate-Vitamin D 600-400 MG-UNIT tablet Take 1 tablet by mouth daily.    . Cholecalciferol (D3-1000) 25 MCG (1000 UT) tablet Take 1,000 Units by mouth daily.    . ferrous sulfate 325 (65 FE) MG tablet Take 325 mg by mouth. Once on Mon, Wed and Fri with meals.    . metFORMIN (GLUCOPHAGE) 500 MG tablet Take 1 tablet (500 mg total) by mouth 2 (two) times daily with a meal. 180 tablet 1  . metoprolol tartrate (LOPRESSOR) 25 MG tablet Take 1 tablet (25 mg total) by mouth 2 (two) times daily. 180 tablet 1  . Nutritional Supplements (BOOST GLUCOSE CONTROL) LIQD Take 237 mLs by mouth 2 (two) times daily between meals. Twice a day between meals     . Propylene Glycol-Glycerin 1-0.3 % SOLN Place 2 drops into both eyes daily as needed (dry eyes).     . saccharomyces boulardii (FLORASTOR) 250 MG capsule Take 250 mg by mouth 2 (two) times daily.    . sennosides-docusate sodium (SENOKOT-S) 8.6-50 MG tablet Take 2 tablets by mouth at bedtime.     . simvastatin (ZOCOR) 10  MG tablet  TAKE ONE TABLET BY MOUTH DAILY (Patient taking differently: Take 10 mg by mouth every evening. ) 90 tablet 0  . zinc oxide 20 % ointment Apply 1 application topically as needed for irritation.    . gabapentin (NEURONTIN) 100 MG capsule TAKE ONE CAPSULE BY MOUTH EVERY NIGHT AT BEDTIME (Patient taking differently: Take 100 mg by mouth at bedtime. ) 30 capsule 0  . oxyCODONE-acetaminophen (PERCOCET/ROXICET) 5-325 MG tablet Take 1 tablet by mouth every 6 (six) hours as needed for severe pain. (Patient not taking: Reported on 03/25/2019) 10 tablet 0   No current facility-administered medications on file prior to visit.     Allergies:   Allergies  Allergen Reactions  . Tape Other (See Comments)    TAPE WILL TEAR THE SKIN!!!!     Physical Exam  Vitals:   03/25/19 1453  BP: 100/64  Pulse: 72  Temp: (!) 97.1 F (36.2 C)  TempSrc: Oral   There is no height or weight on file to calculate BMI. No exam data present  General: well developed, well nourished, pleasant elderly Caucasian male, seated, in no evident distress Head: head normocephalic and atraumatic.   Neck: supple with no carotid or supraclavicular bruits Cardiovascular: regular rate and rhythm, no murmurs Musculoskeletal: Bilateral AKA Skin:  no rash/petichiae Vascular:  Normal pulses all extremities  Neurologic Exam Mental Status: Awake and fully alert.  Mild dysarthria.  Oriented to place and time. Recent and remote memory intact. Attention span, concentration and fund of knowledge appropriate. Mood and affect appropriate.  Cranial Nerves: Fundoscopic exam reveals sharp disc margins. Pupils equal, briskly reactive to light. Extraocular movements full without nystagmus. Visual fields full to confrontation. Hearing intact. Facial sensation intact. Face, tongue, palate moves normally and symmetrically.  Motor: Normal bulk and tone. Normal strength in all tested extremity muscles. Sensory.: intact to touch , pinprick , position  and vibratory sensation.  Coordination: No ataxia present Gait and Station: Nonambulatory Reflexes: 1+ and symmetric. Toes downgoing.       Diagnostic Data (Labs, Imaging, Testing)  CT HEAD WO CONTRAST 04/01/2018 IMPRESSION: 1. Stable atrophy and white matter disease, likely within normal limits for age. 2. No acute intracranial abnormality. 3. Atherosclerosis. 4. ASPECTS is 10/10  CT ANGIO HEAD W OR WO CONTRAST CT ANGIO NECK W OR WO CONTRAST 04/01/2018 IMPRESSION: Mild atherosclerotic disease in the carotid bifurcation bilaterally. Atherosclerotic calcification and mild stenosis in the cavernous carotid bilaterally.  Moderate stenosis at the origin of the vertebral artery bilaterally. Occlusion distal left vertebral artery at the C1 level. Left PICA occluded. This could be acute or chronic. Correlate with neurologic examination and MRI.  CT HEAD WO CONTRAST 04/02/2018 24 hours post TPA IMPRESSION: 1. Small volume subarachnoid hemorrhage along the left temporoparietal convexity, in retrospect stable from yesterday. 2. No visible acute infarct.  MR BRAIN WO CONTRAST 04/02/2018 IMPRESSION: Acute LEFT medullary brainstem infarct correlating with recent LEFT vertebral and PICA occlusion. LEFT posterior temporal subarachnoid hemorrhage, uncertain etiology, appears to be increased volume from original CT, perhaps tPA related. Atrophy with chronic microvascular ischemic change.  CT HEAD WO CONTRAST 04/09/2018 IMPRESSION: 1. Stable left medulla subacute infarction. 2. Interval partial dispersion of small volume of hemorrhage within the left temporal lobe and subarachnoid hemorrhage over the left temporal convexity. 3. No new acute intracranial abnormality. 4. Stable background of chronic microvascular ischemic changes and volume loss of the brain.  ECHOCARDIOGRAM 04/02/2018 Study Conclusions - Left ventricle: The cavity size was normal. Wall thickness  was    normal. Systolic function was normal. The estimated ejection   fraction was in the range of 60% to 65%. Wall motion was normal;   there were no regional wall motion abnormalities. Doppler   parameters are consistent with abnormal left ventricular   relaxation (grade 1 diastolic dysfunction). - Aortic valve: Valve mobility was restricted. There was moderate   stenosis. There was mild regurgitation. Valve area (VTI): 1.04   cm^2. Valve area (Vmax): 0.82 cm^2. Valve area (Vmean): 0.99   cm^2.   ASSESSMENT: Nathan Cook is a 85 y.o. 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 Vascular risk factors include HTN, HLD, DM, PVD and newly diagnosed AF.  Residual deficits of mild dysarthria with ongoing improvement.  Since prior visit, underwent left AKA 10/11/2018 and right AKA 02/07/2019 secondary to severe PAD.    PLAN:  1. Left central medullary infarct: Continue Eliquis (apixaban) daily  and simvastatin for secondary stroke prevention.  Advised to continue current therapies for continued deficits.  Maintain strict control of hypertension with blood pressure goal below 130/90, diabetes with hemoglobin A1c goal below 6.5% and cholesterol with LDL cholesterol (bad cholesterol) goal below 70 mg/dL.  I also advised the patient to eat a healthy diet with plenty of whole grains, cereals, fruits and vegetables, exercise regularly with at least 30 minutes of continuous activity daily and maintain ideal body weight.  2. PAF: Continue Eliquis and ongoing follow-up with cardiology as scheduled 3.   HTN: Advised to continue current treatment regimen.  Advised to continue to monitor at home       along with continued follow-up with PCP for management 4.   HLD: Advised to continue current treatment regimen along with continued follow-up with PCP for future prescribing       and monitoring of lipid panel 5.   DMII: Advised  to continue to monitor glucose levels at home along with continued follow-up with PCP for                   management and monitoring   Overall stable from stroke standpoint recommend follow-up as needed   Greater than 50% of time during this 25 minute visit was spent on counseling, explanation of diagnosis of left central medullary infarct, reviewing risk factor management of PAF on AC, HTN, HLD, DM, and PVD, planning of further management along with potential future management, and discussion with patient and family answering all questions.    Frann Rider, AGNP-BC  [facility] [address] [place], [place] [address]  Phone [phone] Fax [phone] Note: This document was prepared with digital dictation and possible smart phrase technology. Any transcriptional errors that result from this process are unintentional.

## 2019-03-26 NOTE — Progress Notes (Signed)
I agree with the above plan 

## 2019-04-04 ENCOUNTER — Non-Acute Institutional Stay (SKILLED_NURSING_FACILITY): Payer: Medicare Other | Admitting: Internal Medicine

## 2019-04-04 ENCOUNTER — Encounter: Payer: Self-pay | Admitting: Internal Medicine

## 2019-04-04 DIAGNOSIS — Z89611 Acquired absence of right leg above knee: Secondary | ICD-10-CM

## 2019-04-04 DIAGNOSIS — I480 Paroxysmal atrial fibrillation: Secondary | ICD-10-CM

## 2019-04-04 DIAGNOSIS — E1122 Type 2 diabetes mellitus with diabetic chronic kidney disease: Secondary | ICD-10-CM | POA: Diagnosis not present

## 2019-04-04 DIAGNOSIS — E871 Hypo-osmolality and hyponatremia: Secondary | ICD-10-CM | POA: Diagnosis not present

## 2019-04-04 DIAGNOSIS — N183 Chronic kidney disease, stage 3 unspecified: Secondary | ICD-10-CM

## 2019-04-04 DIAGNOSIS — Z89612 Acquired absence of left leg above knee: Secondary | ICD-10-CM

## 2019-04-04 LAB — CHLORIDE
Calcium: 9.2
Carbon Dioxide, Total: 28
Chloride: 95

## 2019-04-04 NOTE — Progress Notes (Signed)
Location:    Nursing Home Room Number: 21 Place of Service:  SNF (31) Provider:  Veleta Miners MD  Virgie Dad, MD  Patient Care Team: Virgie Dad, MD as PCP - General (Internal Medicine) Buford Dresser, MD as PCP - Cardiology (Cardiology) Ricard Dillon, MD as Consulting Physician (Internal Medicine)  Extended Emergency Contact Information Primary Emergency Contact: Gordon,[place] Address: [address]          [place], [place] [address] [name] of [place] Phone: [phone] Mobile Phone: [phone] Relation: Daughter  Code Status:  DNR Goals of care: Advanced Directive information Advanced Directives 04/04/2019  Does Patient Have a Medical Advance Directive? Yes  Type of Paramedic of [place];Living will;Out of facility DNR (pink MOST or yellow form)  Does patient want to make changes to medical advance directive? No - Patient declined  Copy of [place] in Chart? Yes - validated most recent copy scanned in chart (See row information)  Pre-existing out of facility DNR order (yellow form or pink MOST form) Yellow form placed in chart (order not valid for inpatient use);Pink MOST form placed in chart (order not valid for inpatient use)     Chief Complaint  Patient presents with  . Medical Management of Chronic Issues  . Health Maintenance        HPI:  Pt is a 85 y.o. male seen today for medical management of chronic diseases.    Patient has h/o Hypertension, Hyperlipidemia, Diabetes mellitus, h/o Brain stem Infarct s/p TPA In 10/19, Hyponatremia,Atrial Fibrillation on Eliquis since 10/19.,PADs/p Bilateral AKA with first Left AKA and recent Right AKA  Patient has history of severe PAD wounds underwent Left AKA and recently Right AKA.  Patient has recovered very well since then.  He is using electric wheelchair to get around his wound has healed now. He has decided to stay in SNF for long-term  care. Is not having any problems today No New Nursing Issues Seems in very Good Mood.  Past Medical History:  Diagnosis Date  . BPH (benign prostatic hyperplasia) 10/14/2014  . Cervicalgia 01/04/2016  . Chronic kidney disease   . Contusion of left shoulder 05/27/14  . DM type 2 (diabetes mellitus, type 2) ([place])   . Dysphagia   . High blood pressure   . History of basal cell cancer 11/21/2005   Right temple  . History of colonic polyps 10/14/2003  . Hyperlipidemia   . Left knee pain 05/27/14  . Peripheral neuropathic pain   . Peripheral vascular disease ([place])   . SAH (subarachnoid hemorrhage) ([place]) 2009   TBI  . Stroke ([place])   . TIA (transient ischemic attack) 11/06/2005   Left eye pain. Slurred speech. Diaphoresis. No residual effects.    Past Surgical History:  Procedure Laterality Date  . ABDOMINAL AORTOGRAM W/LOWER EXTREMITY N/A 06/12/2018   Procedure: ABDOMINAL AORTOGRAM W/LOWER EXTREMITY;  Surgeon: Marty Heck, MD;  Location: [facility];  Service: Cardiovascular;  Laterality: N/A;  . Achilles tendon repair Left 1998   ruptured tendon  . AMPUTATION Left 10/11/2018   Procedure: Left  AMPUTATION ABOVE KNEE;  Surgeon: Marty Heck, MD;  Location: [facility];  Service: Vascular;  Laterality: Left;  . AMPUTATION Right 02/07/2019   Procedure: AMPUTATION ABOVE KNEE;  Surgeon: Rosetta Posner, MD;  Location: [facility];  Service: Vascular;  Laterality: Right;  . COLONOSCOPY  1992   Polyp removed  . COLONOSCOPY  1998   normal  .  ORIF FEMUR FRACTURE Right 1929  . PERIPHERAL VASCULAR INTERVENTION Left 06/12/2018   Procedure: PERIPHERAL VASCULAR INTERVENTION;  Surgeon: Marty Heck, MD;  Location: [facility];  Service: Cardiovascular;  Laterality: Left;  Attempted   . TONSILLECTOMY      Allergies  Allergen Reactions  . Tape Other (See Comments)    TAPE WILL TEAR THE SKIN!!!!    Allergies as of 04/04/2019      Reactions   Tape Other (See Comments)    TAPE WILL TEAR THE SKIN!!!!      Medication List       Accurate as of April 04, 2019 10:42 AM. If you have any questions, ask your nurse or doctor.        STOP taking these medications   Boost Glucose Control Liqd Stopped by: Virgie Dad, MD     TAKE these medications   Acetaminophen 8 Hour 650 MG CR tablet Generic drug: acetaminophen Take 650 mg by mouth every 8 (eight) hours as needed for pain.   bisacodyl 10 MG suppository Commonly known as: DULCOLAX Place 10 mg rectally daily as needed for moderate constipation.   Calcium Carbonate-Vitamin D 600-400 MG-UNIT tablet Take 1 tablet by mouth daily.   D3-1000 25 MCG (1000 UT) tablet Generic drug: Cholecalciferol Take 1,000 Units by mouth daily.   Eliquis 2.5 MG Tabs tablet Generic drug: apixaban Take 2.5 mg by mouth 2 (two) times daily.   feeding supplement (PRO-STAT SUGAR FREE 64) Liqd Take 30 mLs by mouth daily.   ferrous sulfate 325 (65 FE) MG tablet Take 325 mg by mouth. Once on Mon, Wed and Fri with meals.   gabapentin 100 MG capsule Commonly known as: NEURONTIN TAKE ONE CAPSULE BY MOUTH EVERY NIGHT AT BEDTIME   metFORMIN 500 MG tablet Commonly known as: GLUCOPHAGE Take 1 tablet (500 mg total) by mouth 2 (two) times daily with a meal.   metoprolol tartrate 25 MG tablet Commonly known as: LOPRESSOR Take 1 tablet (25 mg total) by mouth 2 (two) times daily.   oxyCODONE-acetaminophen 5-325 MG tablet Commonly known as: PERCOCET/ROXICET Take by mouth every 8 (eight) hours as needed for severe pain. Give one tablet by mouth every 8 hours as needed for severe pain. Do not exceed 3000 mg of acetaminophen per 24 hours. What changed: Another medication with the same name was removed. Continue taking this medication, and follow the directions you see here. Changed by: Virgie Dad, MD   Propylene Glycol-Glycerin 1-0.3 % Soln Place 2 drops into both eyes daily as needed (dry eyes).   saccharomyces boulardii  250 MG capsule Commonly known as: FLORASTOR Take 250 mg by mouth 2 (two) times daily.   sennosides-docusate sodium 8.6-50 MG tablet Commonly known as: SENOKOT-S Take 2 tablets by mouth at bedtime.   simvastatin 10 MG tablet Commonly known as: ZOCOR TAKE ONE TABLET BY MOUTH DAILY   zinc oxide 20 % ointment Apply 1 application topically as needed for irritation.       Review of Systems Review of Systems  Constitutional: Negative for activity change, appetite change, chills, diaphoresis, fatigue and fever.  HENT: Negative for mouth sores, postnasal drip, rhinorrhea, sinus pain and sore throat.   Respiratory: Negative for apnea, cough, chest tightness, shortness of breath and wheezing.   Cardiovascular: Negative for chest pain, palpitations and leg swelling.  Gastrointestinal: Negative for abdominal distention, abdominal pain, constipation, diarrhea, nausea and vomiting.  Genitourinary: Negative for dysuria and frequency.  Musculoskeletal: Negative for  arthralgias, joint swelling and myalgias.  Skin: Negative for rash.  Neurological: Negative for dizziness, syncope, weakness, light-headedness and numbness.  Psychiatric/Behavioral: Negative for behavioral problems, confusion and sleep disturbance.     Immunization History  Administered Date(s) Administered  . DTaP 12/31/2012  . Influenza, High Dose Seasonal PF 04/04/2017  . Influenza,inj,Quad PF,6+ Mos 03/28/2018  . Influenza-Unspecified 04/10/2014, 03/25/2015, 04/06/2016  . PPD Test 04/29/2014  . Pneumococcal Conjugate-13 04/28/2017  . Pneumococcal-Unspecified 03/13/2011  . Td 12/24/2012  . Tdap 04/28/2017  . Zoster Recombinat (Shingrix) 12/14/2017   Pertinent  Health Maintenance Due  Topic Date Due  . OPHTHALMOLOGY EXAM  07/07/2017  . FOOT EXAM  06/27/2018  . INFLUENZA VACCINE  01/25/2019  . URINE MICROALBUMIN  03/14/2019  . HEMOGLOBIN A1C  06/18/2019  . PNA vac Low Risk Adult  Completed   Fall Risk  10/02/2018  09/11/2018 08/28/2018 08/08/2018 08/07/2018  Falls in the past year? 0 0 0 0 0  Number falls in past yr: 0 0 0 - 0  Injury with Fall? 0 0 0 - 0  Risk for fall due to : - - - - -   Functional Status Survey:    Vitals:   04/04/19 1029  BP: (!) 108/55  Pulse: 63  Resp: 20  Temp: (!) 97.1 F (36.2 C)  SpO2: 92%  Weight: 102 lb 4.8 oz (46.4 kg)  Height: 5\' 8"  (1.727 m)   Body mass index is 15.55 kg/m. Physical Exam  Constitutional: Oriented to person, place, and time. Well-developed and well-nourished.  HENT:  Head: Normocephalic.  Mouth/Throat: Oropharynx is clear and moist.  Eyes: Pupils are equal, round, and reactive to light.  Neck: Neck supple.  Cardiovascular: Normal rate and normal heart sounds.  No murmur heard. Pulmonary/Chest: Effort normal and breath sounds normal. No respiratory distress. No wheezes. She has no rales.  Abdominal: Soft. Bowel sounds are normal. No distension. There is no tenderness. There is no rebound.  Musculoskeletal: S/P AKA Bilateral Has healed well Lymphadenopathy: none Neurological: Alert and oriented to person, place, and time.  Skin: Skin is warm and dry.  Psychiatric: Normal mood and affect. Behavior is normal. Thought content normal.    Labs reviewed: Recent Labs    02/08/19 0245 02/09/19 0311 02/10/19 0402  NA 129* 129* 128*  K 4.7 4.4 4.5  CL 94* 98 95*  CO2 22 23 25   GLUCOSE 242* 113* 176*  BUN 20 27* 21  CREATININE 1.22 1.21 0.98  CALCIUM 8.5* 8.2* 8.1*   Recent Labs    08/29/18 0815  10/09/18 1352 10/09/18 1920 10/17/18 10/22/18  AST 15  --  23 22 22 16   ALT 13  --  22 21 14 11   ALKPHOS  --    < > 64 68 67 69  BILITOT 0.7  --  0.8 0.7  --   --   PROT 6.0*  --  6.3* 6.2* 4.6  --   ALBUMIN  --   --  2.5* 2.5* 2.2  --    < > = values in this interval not displayed.   Recent Labs    08/29/18 0815  02/07/19 1233 02/08/19 0245 02/09/19 0311  WBC 11.7*   < > 9.7 14.3* 14.5*  NEUTROABS 8,471*  --   --   --  10.9*   HGB 12.0*   < > 11.8* 11.6* 9.5*  HCT 34.5*   < > 34.4* 33.4* 27.5*  MCV 99.7   < > 97.5 96.8 96.8  PLT 455*   < > 448* 454* 459*   < > = values in this interval not displayed.   Lab Results  Component Value Date   TSH 1.69 10/24/2018   Lab Results  Component Value Date   HGBA1C 6.8 12/17/2018   Lab Results  Component Value Date   CHOL 126 08/29/2018   HDL 49 08/29/2018   LDLCALC 59 08/29/2018   TRIG 95 08/29/2018   CHOLHDL 2.6 08/29/2018    Significant Diagnostic Results in last 30 days:  No results found.  Assessment/Plan  S/P Bilateral AKA Doing well Independent in transfers Doe shave mild Phantom pain Discontinue Oxycodone Continue on Low dose of neurontin Hyponatremia Repeat sodium was 129 Repeat BMP  Anemia Hemoglobin 10.8 On iron Repeat CBC  Diabetes mellitus type 2 CBG mostly less then 200 A1C was 6.8 .  Repeat A1c  Neuropathy Continue on low-dose of Neurontin  Paroxysmal A Fib On Low Doe of Eliquis Rate control on Lopressor  H/O CVA Blood pressure is controlled On Eliquis and statin Was seen recently by neurology CKD stage III Creatinine stable Hyperlipidemia On Zocor LDL 59 in 03/20 Repeat lipid panel  Family/ staff Communication:   Labs/tests ordered:  CBC,CMP,A1C,Fasting Lipid Total time spent in this patient care encounter was  _25  minutes; greater than 50% of the visit spent counseling patient and staff, reviewing records , Labs and coordinating care for problems addressed at this encounter.

## 2019-04-10 DIAGNOSIS — D649 Anemia, unspecified: Secondary | ICD-10-CM | POA: Diagnosis not present

## 2019-04-10 DIAGNOSIS — E119 Type 2 diabetes mellitus without complications: Secondary | ICD-10-CM | POA: Diagnosis not present

## 2019-04-10 LAB — CBC AND DIFFERENTIAL
HCT: 34 — AB (ref 41–53)
Hemoglobin: 11.5 — AB (ref 13.5–17.5)
Neutrophils Absolute: 3876
Platelets: 403 — AB (ref 150–399)
WBC: 6.8

## 2019-04-10 LAB — CBC: RBC: 3.44 — AB (ref 3.87–5.11)

## 2019-04-10 LAB — COMPREHENSIVE METABOLIC PANEL
Albumin: 3 — AB (ref 3.5–5.0)
Calcium: 8.9 (ref 8.7–10.7)
GFR calc Af Amer: 73
GFR calc non Af Amer: 63

## 2019-04-10 LAB — BASIC METABOLIC PANEL
BUN: 27 — AB (ref 4–21)
CO2: 30 — AB (ref 13–22)
Chloride: 99 (ref 99–108)
Creatinine: 1 (ref 0.6–1.3)
Glucose: 116
Potassium: 4.4 (ref 3.4–5.3)
Sodium: 135 — AB (ref 137–147)

## 2019-04-10 LAB — HEPATIC FUNCTION PANEL
ALT: 13 (ref 10–40)
AST: 15 (ref 14–40)
Alkaline Phosphatase: 64 (ref 25–125)
Bilirubin, Total: 0.5

## 2019-04-10 LAB — HEMOGLOBIN A1C: Hemoglobin A1C: 6.4

## 2019-05-16 ENCOUNTER — Encounter: Payer: Self-pay | Admitting: Nurse Practitioner

## 2019-05-16 ENCOUNTER — Non-Acute Institutional Stay (SKILLED_NURSING_FACILITY): Payer: Medicare Other | Admitting: Nurse Practitioner

## 2019-05-16 DIAGNOSIS — M792 Neuralgia and neuritis, unspecified: Secondary | ICD-10-CM | POA: Diagnosis not present

## 2019-05-16 DIAGNOSIS — E1122 Type 2 diabetes mellitus with diabetic chronic kidney disease: Secondary | ICD-10-CM

## 2019-05-16 DIAGNOSIS — R1310 Dysphagia, unspecified: Secondary | ICD-10-CM | POA: Diagnosis not present

## 2019-05-16 DIAGNOSIS — Z89612 Acquired absence of left leg above knee: Secondary | ICD-10-CM

## 2019-05-16 DIAGNOSIS — E871 Hypo-osmolality and hyponatremia: Secondary | ICD-10-CM

## 2019-05-16 DIAGNOSIS — I129 Hypertensive chronic kidney disease with stage 1 through stage 4 chronic kidney disease, or unspecified chronic kidney disease: Secondary | ICD-10-CM

## 2019-05-16 DIAGNOSIS — I480 Paroxysmal atrial fibrillation: Secondary | ICD-10-CM

## 2019-05-16 DIAGNOSIS — Z89611 Acquired absence of right leg above knee: Secondary | ICD-10-CM

## 2019-05-16 DIAGNOSIS — N183 Chronic kidney disease, stage 3 unspecified: Secondary | ICD-10-CM

## 2019-05-16 DIAGNOSIS — D500 Iron deficiency anemia secondary to blood loss (chronic): Secondary | ICD-10-CM

## 2019-05-16 DIAGNOSIS — K5900 Constipation, unspecified: Secondary | ICD-10-CM

## 2019-05-16 NOTE — Assessment & Plan Note (Signed)
Hgb 9.5 02/09/19, update CBC, continue Fe 3x/wk.

## 2019-05-16 NOTE — Assessment & Plan Note (Signed)
Heart rate is in control, continue Metoprolol, Eliquis.  

## 2019-05-16 NOTE — Assessment & Plan Note (Signed)
Controlled, continue Gabapentin 100mg  qd.

## 2019-05-16 NOTE — Assessment & Plan Note (Signed)
Blood pressure is controlled, continue Metoprolol. 

## 2019-05-16 NOTE — Assessment & Plan Note (Signed)
Last serum Na 128, repeat serum Na level.

## 2019-05-16 NOTE — Assessment & Plan Note (Addendum)
Controlled, Hgb a1c 6.8 11/2018. Eye-ophthomology, CBC/diff, CMP/eGFR, urine micro albumin.

## 2019-05-16 NOTE — Assessment & Plan Note (Signed)
Will change Senokot S II to prn nightly.

## 2019-05-16 NOTE — Assessment & Plan Note (Signed)
Cough associated with eating/swollowing.

## 2019-05-16 NOTE — Progress Notes (Signed)
Location:   SNF [facility] Room Number: 48 Place of Service:  SNF (31) Provider:  [place] NP  Virgie Dad, MD  Patient Care Team: Virgie Dad, MD as PCP - General (Internal Medicine) Buford Dresser, MD as PCP - Cardiology (Cardiology) Ricard Dillon, MD as Consulting Physician (Internal Medicine)  Extended Emergency Contact Information Primary Emergency Contact: Gordon,[place] Address: [address]          [place], [place] [address] [name] of [place] Phone: [phone] Mobile Phone: [phone] Relation: Daughter  Code Status:  DNR Goals of care: Advanced Directive information Advanced Directives 04/04/2019  Does Patient Have a Medical Advance Directive? Yes  Type of Paramedic of [place];Living will;Out of facility DNR (pink MOST or yellow form)  Does patient want to make changes to medical advance directive? No - Patient declined  Copy of [place] in Chart? Yes - validated most recent copy scanned in chart (See row information)  Pre-existing out of facility DNR order (yellow form or pink MOST form) Yellow form placed in chart (order not valid for inpatient use);Pink MOST form placed in chart (order not valid for inpatient use)     Chief Complaint  Patient presents with   Medical Management of Chronic Issues   Health Maintenance    Eye and foot exam. Urine microalbumin    HPI:  Pt is a 85 y.o. male seen today for medical management of chronic diseases.    The patient resides in SNF [facility] for safety, care assistance, s/p R+L AKA, w/c for mobility. Hx of Hyponatremia, last serum Na 128. Anemia, Hgb 9.5 02/09/19, on Fe 3x/wk. T2DM, 12/22/18 Hgb a1c 6.8, on Metformin '500mg'$  bid. Constipation, on Senokot S II qhs-the patient desires changing it to prn. HTN, blood pressure is controlled on Metoprolol '25mg'$  bid. Phantom pain/neuropathic pain, stable, on Gabapentin '100mg'$  qhs. Afib, heart rate is in  control, on Elliquis 2.'5mg'$  bid.    Past Medical History:  Diagnosis Date   BPH (benign prostatic hyperplasia) 10/14/2014   Cervicalgia 01/04/2016   Chronic kidney disease    Contusion of left shoulder 05/27/14   DM type 2 (diabetes mellitus, type 2) ([place])    Dysphagia    High blood pressure    History of basal cell cancer 11/21/2005   Right temple   History of colonic polyps 10/14/2003   Hyperlipidemia    Left knee pain 05/27/14   Peripheral neuropathic pain    Peripheral vascular disease ([place])    SAH (subarachnoid hemorrhage) ([place]) 2009   TBI   Stroke [facility])    TIA (transient ischemic attack) 11/06/2005   Left eye pain. Slurred speech. Diaphoresis. No residual effects.    Past Surgical History:  Procedure Laterality Date   ABDOMINAL AORTOGRAM W/LOWER EXTREMITY N/A 06/12/2018   Procedure: ABDOMINAL AORTOGRAM W/LOWER EXTREMITY;  Surgeon: Marty Heck, MD;  Location: [facility];  Service: Cardiovascular;  Laterality: N/A;   Achilles tendon repair Left 1998   ruptured tendon   AMPUTATION Left 10/11/2018   Procedure: Left  AMPUTATION ABOVE KNEE;  Surgeon: Marty Heck, MD;  Location: [facility];  Service: Vascular;  Laterality: Left;   AMPUTATION Right 02/07/2019   Procedure: AMPUTATION ABOVE KNEE;  Surgeon: Rosetta Posner, MD;  Location: [facility];  Service: Vascular;  Laterality: Right;   COLONOSCOPY  1992   Polyp removed   COLONOSCOPY  1998   normal   ORIF FEMUR FRACTURE Right 1929  PERIPHERAL VASCULAR INTERVENTION Left 06/12/2018   Procedure: PERIPHERAL VASCULAR INTERVENTION;  Surgeon: Marty Heck, MD;  Location: [facility];  Service: Cardiovascular;  Laterality: Left;  Attempted    TONSILLECTOMY      Allergies  Allergen Reactions   Tape Other (See Comments)    TAPE WILL TEAR THE SKIN!!!!    Allergies as of 05/16/2019      Reactions   Tape Other (See Comments)   TAPE WILL TEAR THE SKIN!!!!      Medication List         Accurate as of May 16, 2019 11:59 PM. If you have any questions, ask your nurse or doctor.        STOP taking these medications   oxyCODONE-acetaminophen 5-325 MG tablet Commonly known as: PERCOCET/ROXICET Stopped by: Marlayna Bannister X Amaurie Wandel, NP   saccharomyces boulardii 250 MG capsule Commonly known as: FLORASTOR Stopped by: Jameisha Stofko X [place] Schweppe, NP     TAKE these medications   Acetaminophen 8 Hour 650 MG CR tablet Generic drug: acetaminophen Take 650 mg by mouth every 8 (eight) hours as needed for pain.   bisacodyl 10 MG suppository Commonly known as: DULCOLAX Place 10 mg rectally daily as needed for moderate constipation.   Calcium Carbonate-Vitamin D 600-400 MG-UNIT tablet Take 1 tablet by mouth daily.   D3-1000 25 MCG (1000 UT) tablet Generic drug: Cholecalciferol Take 1,000 Units by mouth daily.   Eliquis 2.5 MG Tabs tablet Generic drug: apixaban Take 2.5 mg by mouth 2 (two) times daily.   feeding supplement (PRO-STAT SUGAR FREE 64) Liqd Take 30 mLs by mouth daily.   ferrous sulfate 325 (65 FE) MG tablet Take 325 mg by mouth. Once on Mon, Wed and Fri with meals.   gabapentin 100 MG capsule Commonly known as: NEURONTIN TAKE ONE CAPSULE BY MOUTH EVERY NIGHT AT BEDTIME   metFORMIN 500 MG tablet Commonly known as: GLUCOPHAGE Take 1 tablet (500 mg total) by mouth 2 (two) times daily with a meal.   metoprolol tartrate 25 MG tablet Commonly known as: LOPRESSOR Take 1 tablet (25 mg total) by mouth 2 (two) times daily.   Propylene Glycol-Glycerin 1-0.3 % Soln Place 2 drops into both eyes daily as needed (dry eyes).   sennosides-docusate sodium 8.6-50 MG tablet Commonly known as: SENOKOT-S Take 2 tablets by mouth at bedtime.   simvastatin 10 MG tablet Commonly known as: ZOCOR TAKE ONE TABLET BY MOUTH DAILY   zinc oxide 20 % ointment Apply 1 application topically as needed for irritation.       Review of Systems  Constitutional: Negative for activity change,  appetite change, chills, diaphoresis, fatigue, fever and unexpected weight change.  HENT: Positive for hearing loss and trouble swallowing. Negative for congestion and voice change.   Respiratory: Positive for cough. Negative for shortness of breath and wheezing.   Gastrointestinal: Negative for abdominal distention, abdominal pain, constipation, diarrhea, nausea and vomiting.  Genitourinary: Negative for difficulty urinating, frequency and urgency.  Musculoskeletal: Positive for gait problem.       S/p R+L AKA  Skin: Negative for color change and pallor.  Neurological: Negative for dizziness, speech difficulty, weakness and headaches.  Psychiatric/Behavioral: Negative for agitation, behavioral problems, hallucinations and sleep disturbance. The patient is not nervous/anxious.     Immunization History  Administered Date(s) Administered   DTaP 12/31/2012   Influenza, High Dose Seasonal PF 04/04/2017, 04/10/2019   Influenza,inj,Quad PF,6+ Mos 03/28/2018   Influenza-Unspecified 04/10/2014, 03/25/2015, 04/06/2016   PPD Test 04/29/2014  Pneumococcal Conjugate-13 04/28/2017   Pneumococcal-Unspecified 03/13/2011   Td 12/24/2012   Tdap 04/28/2017   Zoster Recombinat (Shingrix) 12/14/2017   Pertinent  Health Maintenance Due  Topic Date Due   OPHTHALMOLOGY EXAM  07/07/2017   FOOT EXAM  06/27/2018   URINE MICROALBUMIN  03/14/2019   HEMOGLOBIN A1C  06/18/2019   INFLUENZA VACCINE  Completed   PNA vac Low Risk Adult  Completed   Fall Risk  10/02/2018 09/11/2018 08/28/2018 08/08/2018 08/07/2018  Falls in the past year? 0 0 0 0 0  Number falls in past yr: 0 0 0 - 0  Injury with Fall? 0 0 0 - 0  Risk for fall due to : - - - - -   Functional Status Survey:    Vitals:   05/16/19 1113  BP: (!) 107/53  Pulse: 69  Resp: 17  Temp: 97.9 F (36.6 C)  SpO2: 96%  Weight: 104 lb 12.8 oz (47.5 kg)  Height: '5\' 8"'$  (1.727 m)   Body mass index is 15.93 kg/m. Physical Exam Vitals  signs and nursing note reviewed.  Constitutional:      General: He is not in acute distress.    Appearance: Normal appearance. He is not ill-appearing, toxic-appearing or diaphoretic.  HENT:     Head: Normocephalic and atraumatic.     Nose: Nose normal.     Mouth/Throat:     Mouth: Mucous membranes are moist.  Eyes:     Extraocular Movements: Extraocular movements intact.     Conjunctiva/sclera: Conjunctivae normal.     Pupils: Pupils are equal, round, and reactive to light.  Neck:     Musculoskeletal: Normal range of motion and neck supple.  Cardiovascular:     Rate and Rhythm: Normal rate and regular rhythm.     Heart sounds: Murmur present.  Pulmonary:     Breath sounds: No wheezing, rhonchi or rales.  Abdominal:     General: Bowel sounds are normal. There is no distension.     Palpations: Abdomen is soft.     Tenderness: There is no abdominal tenderness. There is no right CVA tenderness, left CVA tenderness, guarding or rebound.  Musculoskeletal:     Right lower leg: No edema.     Left lower leg: No edema.     Comments: S/p R+L AKA  Feet:     Comments: S/p R+L AKA Skin:    General: Skin is warm and dry.  Neurological:     General: No focal deficit present.     Mental Status: He is alert and oriented to person, place, and time. Mental status is at baseline.     Motor: No weakness.     Coordination: Coordination normal.     Gait: Gait abnormal.  Psychiatric:        Mood and Affect: Mood normal.        Thought Content: Thought content normal.        Judgment: Judgment normal.     Labs reviewed: Recent Labs    02/08/19 0245 02/09/19 0311 02/10/19 0402  NA 129* 129* 128*  K 4.7 4.4 4.5  CL 94* 98 95*  CO2 '22 23 25  '$ GLUCOSE 242* 113* 176*  BUN 20 27* 21  CREATININE 1.22 1.21 0.98  CALCIUM 8.5* 8.2* 8.1*   Recent Labs    08/29/18 0815  10/09/18 1352 10/09/18 1920 10/17/18 10/22/18  AST 15  --  '23 22 22 16  '$ ALT 13  --  '22 21 14 '$ 11  ALKPHOS  --    < > 64  68 67 69  BILITOT 0.7  --  0.8 0.7  --   --   PROT 6.0*  --  6.3* 6.2* 4.6  --   ALBUMIN  --   --  2.5* 2.5* 2.2  --    < > = values in this interval not displayed.   Recent Labs    08/29/18 0815  02/07/19 1233 02/08/19 0245 02/09/19 0311  WBC 11.7*   < > 9.7 14.3* 14.5*  NEUTROABS 8,471*  --   --   --  10.9*  HGB 12.0*   < > 11.8* 11.6* 9.5*  HCT 34.5*   < > 34.4* 33.4* 27.5*  MCV 99.7   < > 97.5 96.8 96.8  PLT 455*   < > 448* 454* 459*   < > = values in this interval not displayed.   Lab Results  Component Value Date   TSH 1.69 10/24/2018   Lab Results  Component Value Date   HGBA1C 6.8 12/17/2018   Lab Results  Component Value Date   CHOL 126 08/29/2018   HDL 49 08/29/2018   LDLCALC 59 08/29/2018   TRIG 95 08/29/2018   CHOLHDL 2.6 08/29/2018    Significant Diagnostic Results in last 30 days:  No results found.  Assessment/Plan Controlled type 2 diabetes mellitus with stage 3 chronic kidney disease, without long-term current use of insulin (HCC) Controlled, Hgb a1c 6.8 11/2018. Eye-ophthomology, CBC/diff, CMP/eGFR, urine micro albumin.  Dysphagia Cough associated with eating/swollowing.   Paroxysmal atrial fibrillation (HCC) Heart rate is in control, continue Metoprolol, Eliquis.   Benign hypertension with chronic kidney disease, stage III (HCC) Blood pressure is controlled, continue Metoprolol.   Neuropathic pain Controlled, continue Gabapentin '100mg'$  qd.   Hyponatremia Last serum Na 128, repeat serum Na level.   Blood loss anemia Hgb 9.5 02/09/19, update CBC, continue Fe 3x/wk.   Constipation Will change Senokot S II to prn nightly.   S/P AKA (above knee amputation) bilateral (HCC) S/p AKA, surgical scars intact.      Family/ staff Communication: plan of care reviewed with the patient and charge nurse.   Labs/tests ordered:  CBC/diff, CMP/eGFR, urine micro albumin  Time spend 25 minutes.

## 2019-05-17 NOTE — Assessment & Plan Note (Signed)
S/p AKA, surgical scars intact.

## 2019-05-19 DIAGNOSIS — E1122 Type 2 diabetes mellitus with diabetic chronic kidney disease: Secondary | ICD-10-CM | POA: Diagnosis not present

## 2019-05-19 DIAGNOSIS — N189 Chronic kidney disease, unspecified: Secondary | ICD-10-CM | POA: Diagnosis not present

## 2019-05-19 DIAGNOSIS — Z79899 Other long term (current) drug therapy: Secondary | ICD-10-CM | POA: Diagnosis not present

## 2019-05-19 DIAGNOSIS — I129 Hypertensive chronic kidney disease with stage 1 through stage 4 chronic kidney disease, or unspecified chronic kidney disease: Secondary | ICD-10-CM | POA: Diagnosis not present

## 2019-05-19 DIAGNOSIS — R509 Fever, unspecified: Secondary | ICD-10-CM | POA: Diagnosis not present

## 2019-05-19 LAB — BASIC METABOLIC PANEL
BUN: 31 — AB (ref 4–21)
CO2: 27 — AB (ref 13–22)
Chloride: 100 (ref 99–108)
Creatinine: 1.1 (ref 0.6–1.3)
Glucose: 118
Potassium: 4.5 (ref 3.4–5.3)
Sodium: 137 (ref 137–147)

## 2019-05-19 LAB — COMPREHENSIVE METABOLIC PANEL
Albumin: 3.3 — AB (ref 3.5–5.0)
Calcium: 9.4 (ref 8.7–10.7)
GFR calc Af Amer: 69
GFR calc non Af Amer: 59
Globulin: 2.5

## 2019-05-19 LAB — CBC AND DIFFERENTIAL
HCT: 35 — AB (ref 41–53)
Hemoglobin: 11.9 — AB (ref 13.5–17.5)
Neutrophils Absolute: 3510
Platelets: 369 (ref 150–399)
WBC: 6.5

## 2019-05-19 LAB — HEPATIC FUNCTION PANEL
ALT: 16 (ref 10–40)
AST: 15 (ref 14–40)
Alkaline Phosphatase: 68 (ref 25–125)
Bilirubin, Total: 0.5

## 2019-05-19 LAB — CBC: RBC: 3.47 — AB (ref 3.87–5.11)

## 2019-05-26 ENCOUNTER — Non-Acute Institutional Stay (SKILLED_NURSING_FACILITY): Payer: Medicare Other | Admitting: Internal Medicine

## 2019-05-26 DIAGNOSIS — I480 Paroxysmal atrial fibrillation: Secondary | ICD-10-CM

## 2019-05-26 DIAGNOSIS — M792 Neuralgia and neuritis, unspecified: Secondary | ICD-10-CM | POA: Diagnosis not present

## 2019-05-26 DIAGNOSIS — Z89611 Acquired absence of right leg above knee: Secondary | ICD-10-CM

## 2019-05-26 DIAGNOSIS — E1122 Type 2 diabetes mellitus with diabetic chronic kidney disease: Secondary | ICD-10-CM

## 2019-05-26 DIAGNOSIS — Z89612 Acquired absence of left leg above knee: Secondary | ICD-10-CM

## 2019-05-26 DIAGNOSIS — R8279 Other abnormal findings on microbiological examination of urine: Secondary | ICD-10-CM

## 2019-05-26 DIAGNOSIS — N183 Chronic kidney disease, stage 3 unspecified: Secondary | ICD-10-CM

## 2019-05-26 NOTE — Progress Notes (Signed)
Location: [employer] features editor of Service:  SNF (31)  Provider:   Code Status:  Goals of Care:  Advanced Directives 04/04/2019  Does Patient Have a Medical Advance Directive? Yes  Type of Paramedic of [place];Living will;Out of facility DNR (pink MOST or yellow form)  Does patient want to make changes to medical advance directive? No - Patient declined  Copy of [place] in Chart? Yes - validated most recent copy scanned in chart (See row information)  Pre-existing out of facility DNR order (yellow form or pink MOST form) Yellow form placed in chart (order not valid for inpatient use);Pink MOST form placed in chart (order not valid for inpatient use)     Chief Complaint  Patient presents with  . Acute Visit    HPI: Patient is a 85 y.o. male seen today for an acute visit for Positive Urine Culture  Patient is long term resident of facility   Patient has h/o Hypertension, Hyperlipidemia, Diabetes mellitus, h/o Brain stem Infarct s/p TPA In 10/19,Hyponatremia,Atrial Fibrillation on Eliquis since 10/19.,PADs/p Bilateral AKA with first Left AKA and recent Right AKA  He had Urine tested for Microalbuminuria and lab also did Culture. Culture came back for more then 100 K Klebsiella. Patient is completely asymptomatic with no Dysuria, Pain, Frequency or fever or Urgency  Past Medical History:  Diagnosis Date  . BPH (benign prostatic hyperplasia) 10/14/2014  . Cervicalgia 01/04/2016  . Chronic kidney disease   . Contusion of left shoulder 05/27/14  . DM type 2 (diabetes mellitus, type 2) ([place])   . Dysphagia   . High blood pressure   . History of basal cell cancer 11/21/2005   Right temple  . History of colonic polyps 10/14/2003  . Hyperlipidemia   . Left knee pain 05/27/14  . Peripheral neuropathic pain   . Peripheral vascular disease ([place])   . SAH (subarachnoid hemorrhage) ([place]) 2009   TBI  . Stroke ([place])   . TIA  (transient ischemic attack) 11/06/2005   Left eye pain. Slurred speech. Diaphoresis. No residual effects.     Past Surgical History:  Procedure Laterality Date  . ABDOMINAL AORTOGRAM W/LOWER EXTREMITY N/A 06/12/2018   Procedure: ABDOMINAL AORTOGRAM W/LOWER EXTREMITY;  Surgeon: Marty Heck, MD;  Location: [facility];  Service: Cardiovascular;  Laterality: N/A;  . Achilles tendon repair Left 1998   ruptured tendon  . AMPUTATION Left 10/11/2018   Procedure: Left  AMPUTATION ABOVE KNEE;  Surgeon: Marty Heck, MD;  Location: [facility];  Service: Vascular;  Laterality: Left;  . AMPUTATION Right 02/07/2019   Procedure: AMPUTATION ABOVE KNEE;  Surgeon: Rosetta Posner, MD;  Location: [facility];  Service: Vascular;  Laterality: Right;  . COLONOSCOPY  1992   Polyp removed  . COLONOSCOPY  1998   normal  . ORIF FEMUR FRACTURE Right 1929  . PERIPHERAL VASCULAR INTERVENTION Left 06/12/2018   Procedure: PERIPHERAL VASCULAR INTERVENTION;  Surgeon: Marty Heck, MD;  Location: [facility];  Service: Cardiovascular;  Laterality: Left;  Attempted   . TONSILLECTOMY      Allergies  Allergen Reactions  . Tape Other (See Comments)    TAPE WILL TEAR THE SKIN!!!!    Outpatient Encounter Medications as of 05/26/2019  Medication Sig  . acetaminophen (ACETAMINOPHEN 8 HOUR) 650 MG CR tablet Take 650 mg by mouth every 8 (eight) hours as needed for pain.  . Amino Acids-Protein Hydrolys (FEEDING SUPPLEMENT, PRO-STAT SUGAR  FREE 64,) LIQD Take 30 mLs by mouth daily.  Marland Kitchen apixaban (ELIQUIS) 2.5 MG TABS tablet Take 2.5 mg by mouth 2 (two) times daily.  . bisacodyl (DULCOLAX) 10 MG suppository Place 10 mg rectally daily as needed for moderate constipation.  . Calcium Carbonate-Vitamin D 600-400 MG-UNIT tablet Take 1 tablet by mouth daily.  . Cholecalciferol (D3-1000) 25 MCG (1000 UT) tablet Take 1,000 Units by mouth daily.  . ferrous sulfate 325 (65 FE) MG tablet Take 325 mg by mouth. Once  on Mon, Wed and Fri with meals.  . gabapentin (NEURONTIN) 100 MG capsule TAKE ONE CAPSULE BY MOUTH EVERY NIGHT AT BEDTIME  . metFORMIN (GLUCOPHAGE) 500 MG tablet Take 1 tablet (500 mg total) by mouth 2 (two) times daily with a meal.  . metoprolol tartrate (LOPRESSOR) 25 MG tablet Take 1 tablet (25 mg total) by mouth 2 (two) times daily.  Marland Kitchen Propylene Glycol-Glycerin 1-0.3 % SOLN Place 2 drops into both eyes daily as needed (dry eyes).   . sennosides-docusate sodium (SENOKOT-S) 8.6-50 MG tablet Take 2 tablets by mouth at bedtime.   . simvastatin (ZOCOR) 10 MG tablet TAKE ONE TABLET BY MOUTH DAILY  . zinc oxide 20 % ointment Apply 1 application topically as needed for irritation.   No facility-administered encounter medications on file as of 05/26/2019.     Review of Systems:  Review of Systems  Review of Systems  Constitutional: Negative for activity change, appetite change, chills, diaphoresis, fatigue and fever.  HENT: Negative for mouth sores, postnasal drip, rhinorrhea, sinus pain and sore throat.   Respiratory: Negative for apnea, cough, chest tightness, shortness of breath and wheezing.   Cardiovascular: Negative for chest pain, palpitations and leg swelling.  Gastrointestinal: Negative for abdominal distention, abdominal pain, constipation, diarrhea, nausea and vomiting.  Genitourinary: Negative for dysuria and frequency.  Musculoskeletal: Negative for arthralgias, joint swelling and myalgias.  Skin: Negative for rash.  Neurological: Negative for dizziness, syncope, weakness, light-headedness and numbness.  Psychiatric/Behavioral: Negative for behavioral problems, confusion and sleep disturbance.     Health Maintenance  Topic Date Due  . OPHTHALMOLOGY EXAM  07/07/2017  . FOOT EXAM  06/27/2018  . URINE MICROALBUMIN  03/14/2019  . HEMOGLOBIN A1C  06/18/2019  . TETANUS/TDAP  04/29/2027  . INFLUENZA VACCINE  Completed  . PNA vac Low Risk Adult  Completed    Physical Exam:  Vitals:   05/31/19 1935  BP: 109/62  Pulse: 66  Resp: 20  Temp: (!) 97.5 F (36.4 C)   There is no height or weight on file to calculate BMI. Physical Exam  Constitutional: Oriented to person, place, and time. Well-developed and well-nourished.  HENT:  Head: Normocephalic.  Mouth/Throat: Oropharynx is clear and moist.  Eyes: Pupils are equal, round, and reactive to light.  Neck: Neck supple.  Cardiovascular: Normal rate and normal heart sounds.   murmur heard. Pulmonary/Chest: Effort normal and breath sounds normal. No respiratory distress. No wheezes. She has no rales.  Abdominal: Soft. Bowel sounds are normal. No distension. There is no tenderness. There is no rebound.  Musculoskeletal: Bilateral AKA Lymphadenopathy: none Neurological: Alert and oriented to person, place, and time.  Skin: Skin is warm and dry.  Psychiatric: Normal mood and affect. Behavior is normal. Thought content normal.    Labs reviewed: Basic Metabolic Panel: Recent Labs    10/24/18  02/08/19 0245 02/09/19 0311 02/10/19 0402  NA  --    < > 129* 129* 128*  K  --    < >  4.7 4.4 4.5  CL  --    < > 94* 98 95*  CO2  --    < > 22 23 25   GLUCOSE  --    < > 242* 113* 176*  BUN  --    < > 20 27* 21  CREATININE  --    < > 1.22 1.21 0.98  CALCIUM  --    < > 8.5* 8.2* 8.1*  TSH 1.69  --   --   --   --    < > = values in this interval not displayed.   Liver Function Tests: Recent Labs    08/29/18 0815  10/09/18 1352 10/09/18 1920 10/17/18 10/22/18  AST 15  --  23 22 22 16   ALT 13  --  22 21 14 11   ALKPHOS  --    < > 64 68 67 69  BILITOT 0.7  --  0.8 0.7  --   --   PROT 6.0*  --  6.3* 6.2* 4.6  --   ALBUMIN  --   --  2.5* 2.5* 2.2  --    < > = values in this interval not displayed.   No results for input(s): LIPASE, AMYLASE in the last 8760 hours. No results for input(s): AMMONIA in the last 8760 hours. CBC: Recent Labs    08/29/18 0815  02/07/19 1233 02/08/19 0245 02/09/19 0311  WBC  11.7*   < > 9.7 14.3* 14.5*  NEUTROABS 8,471*  --   --   --  10.9*  HGB 12.0*   < > 11.8* 11.6* 9.5*  HCT 34.5*   < > 34.4* 33.4* 27.5*  MCV 99.7   < > 97.5 96.8 96.8  PLT 455*   < > 448* 454* 459*   < > = values in this interval not displayed.   Lipid Panel: Recent Labs    08/29/18 0815  CHOL 126  HDL 49  LDLCALC 59  TRIG 95  CHOLHDL 2.6   Lab Results  Component Value Date   HGBA1C 6.8 12/17/2018    Procedures since last visit: No results found.  Assessment/Plan Positive urine culture Patient completely Asymptomatic. I wonder if it was not collected properly Will not treat him right now  Controlled type 2 diabetes mellitus On metformin A1C was 6.4 in 10/20 Paroxysmal atrial fibrillation (HCC) On Eliquis and Lopressor Neuropathic pain Stable on Neurontin  S/P AKA (above knee amputation) bilateral ([place]) Doing well. Independent with his trasfers Anemia Hgb is 11.9    Labs/tests ordered:  * No order type specified * Next appt:  Visit date not found  Total time spent in this patient care encounter was  _25  minutes; greater than 50% of the visit spent counseling patient and staff, reviewing records , Labs and coordinating care for problems addressed at this encounter.

## 2019-05-31 ENCOUNTER — Encounter: Payer: Self-pay | Admitting: Internal Medicine

## 2019-06-05 ENCOUNTER — Non-Acute Institutional Stay (SKILLED_NURSING_FACILITY): Payer: Medicare Other | Admitting: Nurse Practitioner

## 2019-06-05 ENCOUNTER — Encounter: Payer: Self-pay | Admitting: Nurse Practitioner

## 2019-06-05 DIAGNOSIS — N1832 Chronic kidney disease, stage 3b: Secondary | ICD-10-CM

## 2019-06-05 DIAGNOSIS — Z89611 Acquired absence of right leg above knee: Secondary | ICD-10-CM

## 2019-06-05 DIAGNOSIS — Z89612 Acquired absence of left leg above knee: Secondary | ICD-10-CM

## 2019-06-05 DIAGNOSIS — D500 Iron deficiency anemia secondary to blood loss (chronic): Secondary | ICD-10-CM

## 2019-06-05 DIAGNOSIS — M792 Neuralgia and neuritis, unspecified: Secondary | ICD-10-CM

## 2019-06-05 DIAGNOSIS — I129 Hypertensive chronic kidney disease with stage 1 through stage 4 chronic kidney disease, or unspecified chronic kidney disease: Secondary | ICD-10-CM

## 2019-06-05 DIAGNOSIS — E1122 Type 2 diabetes mellitus with diabetic chronic kidney disease: Secondary | ICD-10-CM

## 2019-06-05 DIAGNOSIS — I480 Paroxysmal atrial fibrillation: Secondary | ICD-10-CM

## 2019-06-05 DIAGNOSIS — N183 Chronic kidney disease, stage 3 unspecified: Secondary | ICD-10-CM

## 2019-06-05 DIAGNOSIS — K5900 Constipation, unspecified: Secondary | ICD-10-CM

## 2019-06-05 NOTE — Assessment & Plan Note (Signed)
Heart rate is in control, continue Metoprolol, Eliquis.  

## 2019-06-05 NOTE — Assessment & Plan Note (Signed)
Creat 1.01, eGFR 63 04/10/19

## 2019-06-05 NOTE — Assessment & Plan Note (Signed)
Hgb 11s, 03/2019, continue Fe 3x /wk.

## 2019-06-05 NOTE — Assessment & Plan Note (Signed)
Stable, continue Gabapentin 100mg  qhs.

## 2019-06-05 NOTE — Assessment & Plan Note (Signed)
Blood pressure is controlled. Continue Metoprolol.

## 2019-06-05 NOTE — Assessment & Plan Note (Signed)
Healing nicely.  

## 2019-06-05 NOTE — Assessment & Plan Note (Signed)
Stable, continue Senokot S, prn Dulcolax.

## 2019-06-05 NOTE — Progress Notes (Signed)
Location:   [place] Room Number: 35 Place of Service:  SNF (31) Provider:  Carlis Burnsworth NP  Virgie Dad, MD  Patient Care Team: Virgie Dad, MD as PCP - General (Internal Medicine) Buford Dresser, MD as PCP - Cardiology (Cardiology) Ricard Dillon, MD as Consulting Physician (Internal Medicine)  Extended Emergency Contact Information Primary Emergency Contact: Gordon,[place] Address: [address]          [place], [place] [address] [name] of [place] Phone: [phone] Mobile Phone: [phone] Relation: Daughter  Code Status:  DNR Goals of care: Advanced Directive information Advanced Directives 06/05/2019  Does Patient Have a Medical Advance Directive? Yes  Type of Advance Directive Out of facility DNR (pink MOST or yellow form);Living will;Healthcare Power of Attorney  Does patient want to make changes to medical advance directive? No - Patient declined  Copy of [place] in Chart? Yes - validated most recent copy scanned in chart (See row information)  Pre-existing out of facility DNR order (yellow form or pink MOST form) -     Chief Complaint  Patient presents with  . Medical Management of Chronic Issues  . Health Maintenance    Foot and eye exam. Urine microalbumin    HPI:  Pt is a 85 y.o. male seen today for medical management of chronic diseases.    The patient resides in SNF [facility] for safety, care assistance, s/p R+L AKA, w/c for mobility. Hx of constipation, stable, on Senokot S II qd, prn Dulcolax suppository. Afib, heart rate is in control, on Metoprolol 24m bid, Eliquis 2.513mbid. T2DM, blood sugar is controlled, on Metformin 50035mid. Anemia, stable, taking Fe 3x/wk. Neuropathic pain, controlled, taking Gabapentin 100m30ms. HTN, blood pressure is controlled.    Past Medical History:  Diagnosis Date  . BPH (benign prostatic hyperplasia) 10/14/2014  . Cervicalgia 01/04/2016  . Chronic  kidney disease   . Contusion of left shoulder 05/27/14  . DM type 2 (diabetes mellitus, type 2) (HCC)[place]. Dysphagia   . High blood pressure   . History of basal cell cancer 11/21/2005   Right temple  . History of colonic polyps 10/14/2003  . Hyperlipidemia   . Left knee pain 05/27/14  . Peripheral neuropathic pain   . Peripheral vascular disease (HCC)[place]. SAH (subarachnoid hemorrhage) (HCC)[id number]   TBI  . Stroke (HCC)[place]. TIA (transient ischemic attack) 11/06/2005   Left eye pain. Slurred speech. Diaphoresis. No residual effects.    Past Surgical History:  Procedure Laterality Date  . ABDOMINAL AORTOGRAM W/LOWER EXTREMITY N/A 06/12/2018   Procedure: ABDOMINAL AORTOGRAM W/LOWER EXTREMITY;  Surgeon: ClarMarty Heck;  [facility];  Service: Cardiovascular;  Laterality: N/A;  . Achilles tendon repair Left 1998   ruptured tendon  . AMPUTATION Left 10/11/2018   Procedure: Left  AMPUTATION ABOVE KNEE;  Surgeon: ClarMarty Heck;  [facility];  Laterality: Left;  . AMPUTATION Right 02/07/2019   Procedure: AMPUTATION ABOVE KNEE;  Surgeon: EarlRosetta Posner;  [facility];  Laterality: Right;  . COLONOSCOPY  1992   Polyp removed  . COLONOSCOPY  1998   normal  . ORIF FEMUR FRACTURE Right 1929  . PERIPHERAL VASCULAR INTERVENTION Left 06/12/2018   Procedure: PERIPHERAL VASCULAR INTERVENTION;  Surgeon: ClarMarty Heck;  [facility];  Service: Cardiovascular;  Laterality: Left;  Attempted   . TONSILLECTOMY      Allergies  Allergen Reactions  . Tape Other (See Comments)    TAPE WILL TEAR THE SKIN!!!!    Allergies as of 06/05/2019      Reactions   Tape Other (See Comments)   TAPE WILL TEAR THE SKIN!!!!      Medication List       Accurate as of June 05, 2019 11:49 AM. If you have any questions, ask your nurse or doctor.        Acetaminophen 8 Hour 650 MG CR tablet Generic  drug: acetaminophen Take 650 mg by mouth every 8 (eight) hours as needed for pain.   bisacodyl 10 MG suppository Commonly known as: DULCOLAX Place 10 mg rectally daily as needed for moderate constipation.   Calcium Carbonate-Vitamin D 600-400 MG-UNIT tablet Take 1 tablet by mouth daily.   D3-1000 25 MCG (1000 UT) tablet Generic drug: Cholecalciferol Take 1,000 Units by mouth daily.   Eliquis 2.5 MG Tabs tablet Generic drug: apixaban Take 2.5 mg by mouth 2 (two) times daily.   feeding supplement (PRO-STAT SUGAR FREE 64) Liqd Take 30 mLs by mouth daily.   ferrous sulfate 325 (65 FE) MG tablet Take 325 mg by mouth. Once on Mon, Wed and Fri with meals.   gabapentin 100 MG capsule Commonly known as: NEURONTIN TAKE ONE CAPSULE BY MOUTH EVERY NIGHT AT BEDTIME   metFORMIN 500 MG tablet Commonly known as: GLUCOPHAGE Take 1 tablet (500 mg total) by mouth 2 (two) times daily with a meal.   metoprolol tartrate 25 MG tablet Commonly known as: LOPRESSOR Take 1 tablet (25 mg total) by mouth 2 (two) times daily.   Propylene Glycol-Glycerin 1-0.3 % Soln Place 2 drops into both eyes daily as needed (dry eyes).   sennosides-docusate sodium 8.6-50 MG tablet Commonly known as: SENOKOT-S Take 2 tablets by mouth at bedtime.   simvastatin 10 MG tablet Commonly known as: ZOCOR TAKE ONE TABLET BY MOUTH DAILY   zinc oxide 20 % ointment Apply 1 application topically as needed for irritation.       Review of Systems  Constitutional: Negative for activity change, appetite change, chills, diaphoresis, fatigue, fever and unexpected weight change.  HENT: Positive for hearing loss. Negative for congestion and voice change.   Respiratory: Positive for cough. Negative for shortness of breath and wheezing.        Chronic cough  Cardiovascular: Negative for chest pain, palpitations and leg swelling.  Gastrointestinal: Negative for abdominal distention, abdominal pain, constipation, diarrhea,  nausea and vomiting.  Genitourinary: Negative for difficulty urinating, dysuria and urgency.  Musculoskeletal: Positive for gait problem.       S/p R+L AKA  Skin: Negative for color change and pallor.  Neurological: Negative for dizziness, speech difficulty, weakness and headaches.  Psychiatric/Behavioral: Negative for agitation, behavioral problems, hallucinations and sleep disturbance. The patient is not nervous/anxious.     Immunization History  Administered Date(s) Administered  . DTaP 12/31/2012  . Influenza, High Dose Seasonal PF 04/04/2017, 04/10/2019  . Influenza,inj,Quad PF,6+ Mos 03/28/2018  . Influenza-Unspecified 04/10/2014, 03/25/2015, 04/06/2016  . PPD Test 04/29/2014  . Pneumococcal Conjugate-13 04/28/2017  . Pneumococcal-Unspecified 03/13/2011  . Td 12/24/2012  . Tdap 04/28/2017  . Zoster Recombinat (Shingrix) 12/14/2017   Pertinent  Health Maintenance Due  Topic Date Due  . OPHTHALMOLOGY EXAM  07/07/2017  . FOOT EXAM  06/27/2018  . URINE MICROALBUMIN  03/14/2019  . HEMOGLOBIN A1C  06/18/2019  . INFLUENZA VACCINE  Completed  . PNA vac Low Risk Adult  Completed   Fall Risk  10/02/2018 09/11/2018 08/28/2018 08/08/2018 08/07/2018  Falls in the past year? 0 0 0 0 0  Number falls in past yr: 0 0 0 - 0  Injury with Fall? 0 0 0 - 0  Risk for fall due to : - - - - -   Functional Status Survey:    Vitals:   06/05/19 0957  BP: 112/67  Pulse: 85  Resp: 18  Temp: (!) 97 F (36.1 C)  SpO2: 97%  Weight: 112 lb 9.6 oz (51.1 kg)  Height: _0  (1.727 m)   Body mass index is 17.12 kg/m. Physical Exam Vitals and nursing note reviewed.  Constitutional:      General: He is not in acute distress.    Appearance: Normal appearance. He is not ill-appearing, toxic-appearing or diaphoretic.  HENT:     Head: Normocephalic and atraumatic.     Nose: Nose normal.     Mouth/Throat:     Mouth: Mucous membranes are moist.  Eyes:     Extraocular Movements: Extraocular movements  intact.     Conjunctiva/sclera: Conjunctivae normal.     Pupils: Pupils are equal, round, and reactive to light.  Cardiovascular:     Rate and Rhythm: Normal rate and regular rhythm.     Heart sounds: Murmur present.  Pulmonary:     Breath sounds: No wheezing, rhonchi or rales.  Abdominal:     General: Bowel sounds are normal. There is no distension.     Palpations: Abdomen is soft.     Tenderness: There is no abdominal tenderness. There is no right CVA tenderness, left CVA tenderness, guarding or rebound.  Musculoskeletal:     Cervical back: Normal range of motion and neck supple.     Right lower leg: No edema.     Left lower leg: No edema.     Comments: S/p R+L AKA  Skin:    General: Skin is warm and dry.  Neurological:     General: No focal deficit present.     Mental Status: He is alert and oriented to person, place, and time. Mental status is at baseline.     Motor: No weakness.     Coordination: Coordination normal.     Gait: Gait abnormal.  Psychiatric:        Mood and Affect: Mood normal.        Behavior: Behavior normal.        Thought Content: Thought content normal.        Judgment: Judgment normal.     Labs reviewed: Recent Labs    02/08/19 0245 02/09/19 0311 02/10/19 0402 02/11/19 0000 02/25/19 0000  NA 129* 129* 128* 129* 129*  K 4.7 4.4 4.5 4.7 4.3  CL 94* 98 95* 96* 95*  CO2 _1 26* 29*  GLUCOSE 242* 113* 176*  --   --   BUN 20 27* 21 20 22*  CREATININE 1.22 1.21 0.98 0.9 0.9  CALCIUM 8.5* 8.2* 8.1* 8.1* 8.9   Recent Labs    08/29/18 0815 08/29/18 0815 10/09/18 1352 10/09/18 1920 10/17/18 0000 10/22/18 0000  AST 15  --  _2 ALT 13  --  _3 ALKPHOS  --    < > 64 68 67 69  BILITOT 0.7  --  0.8 0.7  --   --   PROT 6.0*  --  6.3* 6.2*  4.6  --   ALBUMIN  --   --  2.5* 2.5* 2.2  --    < > = values in this interval not displayed.   Recent Labs    08/29/18 0815 10/09/18 1352 02/07/19 1233 02/08/19 0245 02/09/19  0311 02/11/19 0000 02/25/19 0000  WBC 11.7*   < > 9.7 14.3* 14.5* 9.7 7.5  NEUTROABS 8,471*  --   --   --  10.9*  --  3,420  HGB 12.0*  --  11.8* 11.6* 9.5* 9.8* 10.8*  HCT 34.5*  --  34.4* 33.4* 27.5* 29* 32*  MCV 99.7  --  97.5 96.8 96.8  --   --   PLT 455*  --  448* 454* 459* 438* 494*   < > = values in this interval not displayed.   Lab Results  Component Value Date   TSH 1.69 10/24/2018   Lab Results  Component Value Date   HGBA1C 6.8 12/17/2018   Lab Results  Component Value Date   CHOL 126 08/29/2018   HDL 49 08/29/2018   LDLCALC 59 08/29/2018   TRIG 95 08/29/2018   CHOLHDL 2.6 08/29/2018    Significant Diagnostic Results in last 30 days:  No results found.  Assessment/Plan Benign hypertension with chronic kidney disease, stage III (HCC) Blood pressure is controlled. Continue Metoprolol.   Paroxysmal atrial fibrillation (HCC) Heart rate is in control, continue Metoprolol, Eliquis.   Controlled type 2 diabetes mellitus with stage 3 chronic kidney disease, without long-term current use of insulin (HCC) Hgb a1c 6.4 03/2019, continue Metformin.   CKD (chronic kidney disease) stage 3, GFR 30-59 ml/min (HCC) Creat 1.01, eGFR 63 04/10/19  Blood loss anemia Hgb 11s, 03/2019, continue Fe 3x /wk.   Constipation Stable, continue Senokot S, prn Dulcolax.   Neuropathic pain Stable, continue Gabapentin 145m qhs.   S/P AKA (above knee amputation) bilateral (HCC) Healing nicely.      Family/ staff Communication: plan of care reviewed with the patient and charge nurse.   Labs/tests ordered:  None   Time spend 25 minutes.

## 2019-06-05 NOTE — Assessment & Plan Note (Addendum)
Hgb a1c 6.4 03/2019, continue Metformin.

## 2019-06-30 DIAGNOSIS — R634 Abnormal weight loss: Secondary | ICD-10-CM | POA: Diagnosis not present

## 2019-06-30 DIAGNOSIS — Z8673 Personal history of transient ischemic attack (TIA), and cerebral infarction without residual deficits: Secondary | ICD-10-CM | POA: Diagnosis not present

## 2019-06-30 DIAGNOSIS — E1151 Type 2 diabetes mellitus with diabetic peripheral angiopathy without gangrene: Secondary | ICD-10-CM | POA: Diagnosis not present

## 2019-06-30 DIAGNOSIS — Z85828 Personal history of other malignant neoplasm of skin: Secondary | ICD-10-CM | POA: Diagnosis not present

## 2019-06-30 DIAGNOSIS — I739 Peripheral vascular disease, unspecified: Secondary | ICD-10-CM | POA: Diagnosis not present

## 2019-06-30 DIAGNOSIS — R41841 Cognitive communication deficit: Secondary | ICD-10-CM | POA: Diagnosis not present

## 2019-06-30 DIAGNOSIS — R2681 Unsteadiness on feet: Secondary | ICD-10-CM | POA: Diagnosis not present

## 2019-06-30 DIAGNOSIS — I129 Hypertensive chronic kidney disease with stage 1 through stage 4 chronic kidney disease, or unspecified chronic kidney disease: Secondary | ICD-10-CM | POA: Diagnosis not present

## 2019-06-30 DIAGNOSIS — M79605 Pain in left leg: Secondary | ICD-10-CM | POA: Diagnosis not present

## 2019-06-30 DIAGNOSIS — M6281 Muscle weakness (generalized): Secondary | ICD-10-CM | POA: Diagnosis not present

## 2019-06-30 DIAGNOSIS — D500 Iron deficiency anemia secondary to blood loss (chronic): Secondary | ICD-10-CM | POA: Diagnosis not present

## 2019-06-30 DIAGNOSIS — E1122 Type 2 diabetes mellitus with diabetic chronic kidney disease: Secondary | ICD-10-CM | POA: Diagnosis not present

## 2019-06-30 DIAGNOSIS — S91101S Unspecified open wound of right great toe without damage to nail, sequela: Secondary | ICD-10-CM | POA: Diagnosis not present

## 2019-06-30 DIAGNOSIS — I998 Other disorder of circulatory system: Secondary | ICD-10-CM | POA: Diagnosis not present

## 2019-06-30 DIAGNOSIS — Z2089 Contact with and (suspected) exposure to other communicable diseases: Secondary | ICD-10-CM | POA: Diagnosis not present

## 2019-06-30 DIAGNOSIS — Z89612 Acquired absence of left leg above knee: Secondary | ICD-10-CM | POA: Diagnosis not present

## 2019-06-30 DIAGNOSIS — H04129 Dry eye syndrome of unspecified lacrimal gland: Secondary | ICD-10-CM | POA: Diagnosis not present

## 2019-06-30 DIAGNOSIS — M792 Neuralgia and neuritis, unspecified: Secondary | ICD-10-CM | POA: Diagnosis not present

## 2019-06-30 DIAGNOSIS — N189 Chronic kidney disease, unspecified: Secondary | ICD-10-CM | POA: Diagnosis not present

## 2019-06-30 DIAGNOSIS — E785 Hyperlipidemia, unspecified: Secondary | ICD-10-CM | POA: Diagnosis not present

## 2019-07-01 DIAGNOSIS — Z20828 Contact with and (suspected) exposure to other viral communicable diseases: Secondary | ICD-10-CM | POA: Diagnosis not present

## 2019-07-02 ENCOUNTER — Encounter: Payer: Self-pay | Admitting: Internal Medicine

## 2019-07-02 DIAGNOSIS — E119 Type 2 diabetes mellitus without complications: Secondary | ICD-10-CM | POA: Diagnosis not present

## 2019-07-02 LAB — HM DIABETES EYE EXAM

## 2019-07-02 NOTE — Progress Notes (Signed)
This encounter was created in error - please disregard.

## 2019-07-04 ENCOUNTER — Encounter: Payer: Self-pay | Admitting: *Deleted

## 2019-07-08 DIAGNOSIS — Z20828 Contact with and (suspected) exposure to other viral communicable diseases: Secondary | ICD-10-CM | POA: Diagnosis not present

## 2019-07-14 ENCOUNTER — Non-Acute Institutional Stay (SKILLED_NURSING_FACILITY): Payer: PPO | Admitting: Internal Medicine

## 2019-07-14 ENCOUNTER — Encounter: Payer: Self-pay | Admitting: Internal Medicine

## 2019-07-14 DIAGNOSIS — E871 Hypo-osmolality and hyponatremia: Secondary | ICD-10-CM

## 2019-07-14 DIAGNOSIS — Z89611 Acquired absence of right leg above knee: Secondary | ICD-10-CM

## 2019-07-14 DIAGNOSIS — I480 Paroxysmal atrial fibrillation: Secondary | ICD-10-CM

## 2019-07-14 DIAGNOSIS — E1122 Type 2 diabetes mellitus with diabetic chronic kidney disease: Secondary | ICD-10-CM | POA: Diagnosis not present

## 2019-07-14 DIAGNOSIS — N1832 Chronic kidney disease, stage 3b: Secondary | ICD-10-CM

## 2019-07-14 DIAGNOSIS — N183 Chronic kidney disease, stage 3 unspecified: Secondary | ICD-10-CM

## 2019-07-14 DIAGNOSIS — Z89612 Acquired absence of left leg above knee: Secondary | ICD-10-CM | POA: Diagnosis not present

## 2019-07-14 DIAGNOSIS — M792 Neuralgia and neuritis, unspecified: Secondary | ICD-10-CM | POA: Diagnosis not present

## 2019-07-14 NOTE — Progress Notes (Signed)
Location:   [facility] Room Number: 35 Place of Service:  SNF [id number]) Provider:  Virgie Dad, MD  Virgie Dad, MD  Patient Care Team: Virgie Dad, MD as PCP - General (Internal Medicine) Buford Dresser, MD as PCP - Cardiology (Cardiology) Ricard Dillon, MD as Consulting Physician (Internal Medicine)  Extended Emergency Contact Information Primary Emergency Contact: Gordon,[place] Address: [address]          [place], [place] [address] [name] of [place] Phone: [phone] Mobile Phone: [phone] Relation: Daughter  Code Status:  DNR Goals of care: Advanced Directive information Advanced Directives 07/14/2019  Does Patient Have a Medical Advance Directive? Yes  Type of Paramedic of [place];Living will;Out of facility DNR (pink MOST or yellow form)  Does patient want to make changes to medical advance directive? No - Patient declined  Copy of [place] in Chart? Yes - validated most recent copy scanned in chart (See row information)  Pre-existing out of facility DNR order (yellow form or pink MOST form) Yellow form placed in chart (order not valid for inpatient use)     Chief Complaint  Patient presents with  . Medical Management of Chronic Issues  . Health Maintenance    Hemoglobin A1C, Foot exam, urine microalbumin    HPI:  Pt is a 86 y.o. male seen today for medical management of chronic diseases.  Patient has h/o Hypertension, Hyperlipidemia, Diabetes mellitus, h/o Brain stem Infarct s/p TPA In 10/19,Hyponatremia,Atrial Fibrillation on Eliquis since 10/19.,PADs/p Bilateral AKA with first Left AKA and then Right AKA .  Patient is doing well in SNF. Charity fundraiser to get around. I stable to do his transfers. Independent in his most of the ADLS. Mood stable. Phantom pain Manageable Has Gained weight but mostly due to Improved appetite No Nursing Issues. No  Acute issues  Past Medical History:  Diagnosis Date  . BPH (benign prostatic hyperplasia) 10/14/2014  . Cervicalgia 01/04/2016  . Chronic kidney disease   . Contusion of left shoulder 05/27/14  . DM type 2 (diabetes mellitus, type 2) ([place])   . Dysphagia   . High blood pressure   . History of basal cell cancer 11/21/2005   Right temple  . History of colonic polyps 10/14/2003  . Hyperlipidemia   . Left knee pain 05/27/14  . Peripheral neuropathic pain   . Peripheral vascular disease ([place])   . SAH (subarachnoid hemorrhage) ([facility]) 2009   TBI  . Stroke ([facility])   . TIA (transient ischemic attack) 11/06/2005   Left eye pain. Slurred speech. Diaphoresis. No residual effects.    Past Surgical History:  Procedure Laterality Date  . ABDOMINAL AORTOGRAM W/LOWER EXTREMITY N/A 06/12/2018   Procedure: ABDOMINAL AORTOGRAM W/LOWER EXTREMITY;  Surgeon: Marty Heck, MD;  Location: [facility];  Service: Cardiovascular;  Laterality: N/A;  . Achilles tendon repair Left 1998   ruptured tendon  . AMPUTATION Left 10/11/2018   Procedure: Left  AMPUTATION ABOVE KNEE;  Surgeon: Marty Heck, MD;  Location: [facility];  Service: Vascular;  Laterality: Left;  . AMPUTATION Right 02/07/2019   Procedure: AMPUTATION ABOVE KNEE;  Surgeon: Rosetta Posner, MD;  Location: [facility];  Service: Vascular;  Laterality: Right;  . COLONOSCOPY  1992   Polyp removed  . COLONOSCOPY  1998   normal  . ORIF FEMUR FRACTURE Right 1929  . PERIPHERAL VASCULAR INTERVENTION Left 06/12/2018   Procedure: PERIPHERAL VASCULAR INTERVENTION;  Surgeon: Marty Heck, MD;  Location: [facility];  Service: Cardiovascular;  Laterality: Left;  Attempted   . TONSILLECTOMY      Allergies  Allergen Reactions  . Tape Other (See Comments)    TAPE WILL TEAR THE SKIN!!!!    Allergies as of 07/14/2019      Reactions   Tape Other (See Comments)   TAPE WILL TEAR THE SKIN!!!!      Medication List       Accurate as of  July 14, 2019 10:18 AM. If you have any questions, ask your nurse or doctor.        Acetaminophen 8 Hour 650 MG CR tablet Generic drug: acetaminophen Take 650 mg by mouth every 8 (eight) hours as needed for pain.   bisacodyl 10 MG suppository Commonly known as: DULCOLAX Place 10 mg rectally daily as needed for moderate constipation.   Calcium Carbonate-Vitamin D 600-400 MG-UNIT tablet Take 1 tablet by mouth daily.   D3-1000 25 MCG (1000 UT) tablet Generic drug: Cholecalciferol Take 1,000 Units by mouth daily.   Eliquis 2.5 MG Tabs tablet Generic drug: apixaban Take 2.5 mg by mouth 2 (two) times daily.   feeding supplement (PRO-STAT SUGAR FREE 64) Liqd Take 30 mLs by mouth daily.   ferrous sulfate 325 (65 FE) MG tablet Take 325 mg by mouth. Once on Mon, Wed and Fri with meals.   gabapentin 100 MG capsule Commonly known as: NEURONTIN TAKE ONE CAPSULE BY MOUTH EVERY NIGHT AT BEDTIME   metFORMIN 500 MG tablet Commonly known as: GLUCOPHAGE Take 1 tablet (500 mg total) by mouth 2 (two) times daily with a meal.   metoprolol tartrate 25 MG tablet Commonly known as: LOPRESSOR Take 1 tablet (25 mg total) by mouth 2 (two) times daily.   Propylene Glycol-Glycerin 1-0.3 % Soln Place 2 drops into both eyes daily as needed (dry eyes).   sennosides-docusate sodium 8.6-50 MG tablet Commonly known as: SENOKOT-S Take 2 tablets by mouth at bedtime.   simvastatin 10 MG tablet Commonly known as: ZOCOR TAKE ONE TABLET BY MOUTH DAILY   zinc oxide 20 % ointment Apply 1 application topically as needed for irritation.       Review of Systems  Review of Systems  Constitutional: Negative for activity change, appetite change, chills, diaphoresis, fatigue and fever.  HENT: Negative for mouth sores, postnasal drip, rhinorrhea, sinus pain and sore throat.   Respiratory: Negative for apnea, cough, chest tightness, shortness of breath and wheezing.   Cardiovascular: Negative for chest  pain, palpitations and leg swelling.  Gastrointestinal: Negative for abdominal distention, abdominal pain, constipation, diarrhea, nausea and vomiting.  Genitourinary: Negative for dysuria and frequency.  Musculoskeletal: Negative for arthralgias, joint swelling and myalgias.  Skin: Negative for rash.  Neurological: Negative for dizziness, syncope, weakness, light-headedness and numbness.  Psychiatric/Behavioral: Negative for behavioral problems, confusion and sleep disturbance.     Immunization History  Administered Date(s) Administered  . DTaP 12/31/2012  . Influenza, High Dose Seasonal PF 04/04/2017, 04/10/2019  . Influenza,inj,Quad PF,6+ Mos 03/28/2018  . Influenza-Unspecified 04/10/2014, 03/25/2015, 04/06/2016  . PPD Test 04/29/2014  . Pneumococcal Conjugate-13 04/28/2017  . Pneumococcal-Unspecified 03/13/2011  . Td 12/24/2012  . Tdap 04/28/2017  . Zoster Recombinat (Shingrix) 12/14/2017   Pertinent  Health Maintenance Due  Topic Date Due  . FOOT EXAM  06/27/2018  . URINE MICROALBUMIN  03/14/2019  . HEMOGLOBIN A1C  06/18/2019  . OPHTHALMOLOGY EXAM  07/01/2020  . INFLUENZA VACCINE  Completed  .  PNA vac Low Risk Adult  Completed   Fall Risk  10/02/2018 09/11/2018 08/28/2018 08/08/2018 08/07/2018  Falls in the past year? 0 0 0 0 0  Number falls in past yr: 0 0 0 - 0  Injury with Fall? 0 0 0 - 0  Risk for fall due to : - - - - -   Functional Status Survey:    Vitals:   07/14/19 1015  BP: 139/68  Pulse: 66  Resp: 20  Temp: 97.6 F (36.4 C)  SpO2: 97%  Weight: 112 lb 9.6 oz (51.1 kg)  Height: 5\' 8"  (1.727 m)   Body mass index is 17.12 kg/m. Physical Exam  Constitutional: Oriented to person, place, and time. Well-developed and well-nourished.  HENT:  Head: Normocephalic.  Mouth/Throat: Oropharynx is clear and moist.  Eyes: Pupils are equal, round, and reactive to light.  Neck: Neck supple.  Cardiovascular: Normal rate and normal heart sounds.  Positive for  Murmur Pulmonary/Chest: Effort normal and breath sounds normal. No respiratory distress. No wheezes. She has no rales.  Abdominal: Soft. Bowel sounds are normal. No distension. There is no tenderness. There is no rebound.  Musculoskeletal: No edema. Well healed Wounds Bilateral AKA Lymphadenopathy: none Neurological: Alert and oriented to person, place, and time.  Skin: Skin is warm and dry.  Psychiatric: Normal mood and affect. Behavior is normal. Thought content normal.    Labs reviewed: Recent Labs    02/08/19 0245 02/08/19 0245 02/09/19 0311 02/09/19 0311 02/10/19 0402 02/11/19 0000 02/25/19 0000  NA 129*   < > 129*   < > 128* 129* 129*  K 4.7   < > 4.4   < > 4.5 4.7 4.3  CL 94*   < > 98   < > 95* 96* 95*  CO2 22   < > 23   < > 25 26* 29*  GLUCOSE 242*  --  113*  --  176*  --   --   BUN 20   < > 27*   < > 21 20 22*  CREATININE 1.22   < > 1.21   < > 0.98 0.9 0.9  CALCIUM 8.5*   < > 8.2*   < > 8.1* 8.1* 8.9   < > = values in this interval not displayed.   Recent Labs    08/29/18 0815 08/29/18 0815 10/09/18 1352 10/09/18 1352 10/09/18 1920 10/17/18 0000 10/22/18 0000  AST 15   < > 23   < > 22 22 16   ALT 13   < > 22   < > 21 14 11   ALKPHOS  --   --  64   < > 68 67 69  BILITOT 0.7  --  0.8  --  0.7  --   --   PROT 6.0*   < > 6.3*  --  6.2* 4.6  --   ALBUMIN  --   --  2.5*  --  2.5* 2.2  --    < > = values in this interval not displayed.   Recent Labs    08/29/18 0815 10/09/18 1352 02/07/19 1233 02/07/19 1233 02/08/19 0245 02/08/19 0245 02/09/19 0311 02/11/19 0000 02/25/19 0000  WBC 11.7*   < > 9.7   < > 14.3*   < > 14.5* 9.7 7.5  NEUTROABS 8,471*  --   --   --   --   --  10.9*  --  3,420  HGB 12.0*   < > 11.8*   < >  11.6*   < > 9.5* 9.8* 10.8*  HCT 34.5*   < > 34.4*   < > 33.4*   < > 27.5* 29* 32*  MCV 99.7   < > 97.5  --  96.8  --  96.8  --   --   PLT 455*   < > 448*   < > 454*   < > 459* 438* 494*   < > = values in this interval not displayed.   Lab  Results  Component Value Date   TSH 1.69 10/24/2018   Lab Results  Component Value Date   HGBA1C 6.8 12/17/2018   Lab Results  Component Value Date   CHOL 126 08/29/2018   HDL 49 08/29/2018   LDLCALC 59 08/29/2018   TRIG 95 08/29/2018   CHOLHDL 2.6 08/29/2018    Significant Diagnostic Results in last 30 days:  No results found.  Assessment/Plan S/P AKA (above knee amputation) bilateral (HCC) Doing well Mild Phantom Pain Independent with transfers Paroxysmal atrial fibrillation (HCC) On Eliquis  Controlled type 2 diabetes mellitus with stage 3 chronic kidney disease, without long-term current use of insulin (HCC) Last A1C in 10/20 was 6.4 Repeat A1C Stage 3b chronic kidney disease Bun and Creat stable Neuropathic pain On Neurontin  Hyponatremia Soidum is stable  Anemia Hgb is 11.9 On Iron H/o CVA Stable on Eliquis and  statin Hyperlipidemia Continue Zocor  Family/ staff Communication:   Labs/tests ordered:

## 2019-07-15 ENCOUNTER — Telehealth: Payer: Self-pay | Admitting: *Deleted

## 2019-07-15 DIAGNOSIS — Z20828 Contact with and (suspected) exposure to other viral communicable diseases: Secondary | ICD-10-CM | POA: Diagnosis not present

## 2019-07-15 NOTE — Telephone Encounter (Signed)
Nathan Cook is in [place], I was transferred to the nursing station no one answered.

## 2019-07-22 DIAGNOSIS — Z20828 Contact with and (suspected) exposure to other viral communicable diseases: Secondary | ICD-10-CM | POA: Diagnosis not present

## 2019-07-29 DIAGNOSIS — I129 Hypertensive chronic kidney disease with stage 1 through stage 4 chronic kidney disease, or unspecified chronic kidney disease: Secondary | ICD-10-CM | POA: Diagnosis not present

## 2019-07-29 DIAGNOSIS — N189 Chronic kidney disease, unspecified: Secondary | ICD-10-CM | POA: Diagnosis not present

## 2019-07-29 DIAGNOSIS — E785 Hyperlipidemia, unspecified: Secondary | ICD-10-CM | POA: Diagnosis not present

## 2019-07-29 DIAGNOSIS — R634 Abnormal weight loss: Secondary | ICD-10-CM | POA: Diagnosis not present

## 2019-07-29 DIAGNOSIS — H04129 Dry eye syndrome of unspecified lacrimal gland: Secondary | ICD-10-CM | POA: Diagnosis not present

## 2019-07-29 DIAGNOSIS — E1122 Type 2 diabetes mellitus with diabetic chronic kidney disease: Secondary | ICD-10-CM | POA: Diagnosis not present

## 2019-07-29 DIAGNOSIS — M79605 Pain in left leg: Secondary | ICD-10-CM | POA: Diagnosis not present

## 2019-07-29 DIAGNOSIS — S91101S Unspecified open wound of right great toe without damage to nail, sequela: Secondary | ICD-10-CM | POA: Diagnosis not present

## 2019-07-29 DIAGNOSIS — R2681 Unsteadiness on feet: Secondary | ICD-10-CM | POA: Diagnosis not present

## 2019-07-29 DIAGNOSIS — I998 Other disorder of circulatory system: Secondary | ICD-10-CM | POA: Diagnosis not present

## 2019-07-29 DIAGNOSIS — Z8673 Personal history of transient ischemic attack (TIA), and cerebral infarction without residual deficits: Secondary | ICD-10-CM | POA: Diagnosis not present

## 2019-07-29 DIAGNOSIS — Z4781 Encounter for orthopedic aftercare following surgical amputation: Secondary | ICD-10-CM | POA: Diagnosis not present

## 2019-07-29 DIAGNOSIS — Z2089 Contact with and (suspected) exposure to other communicable diseases: Secondary | ICD-10-CM | POA: Diagnosis not present

## 2019-07-29 DIAGNOSIS — I739 Peripheral vascular disease, unspecified: Secondary | ICD-10-CM | POA: Diagnosis not present

## 2019-07-29 DIAGNOSIS — D500 Iron deficiency anemia secondary to blood loss (chronic): Secondary | ICD-10-CM | POA: Diagnosis not present

## 2019-07-29 DIAGNOSIS — Z85828 Personal history of other malignant neoplasm of skin: Secondary | ICD-10-CM | POA: Diagnosis not present

## 2019-07-29 DIAGNOSIS — E1151 Type 2 diabetes mellitus with diabetic peripheral angiopathy without gangrene: Secondary | ICD-10-CM | POA: Diagnosis not present

## 2019-07-29 DIAGNOSIS — Z89612 Acquired absence of left leg above knee: Secondary | ICD-10-CM | POA: Diagnosis not present

## 2019-07-29 DIAGNOSIS — Z20828 Contact with and (suspected) exposure to other viral communicable diseases: Secondary | ICD-10-CM | POA: Diagnosis not present

## 2019-07-29 DIAGNOSIS — M792 Neuralgia and neuritis, unspecified: Secondary | ICD-10-CM | POA: Diagnosis not present

## 2019-07-29 DIAGNOSIS — M6281 Muscle weakness (generalized): Secondary | ICD-10-CM | POA: Diagnosis not present

## 2019-08-01 ENCOUNTER — Encounter: Payer: Self-pay | Admitting: Nurse Practitioner

## 2019-08-01 ENCOUNTER — Non-Acute Institutional Stay (SKILLED_NURSING_FACILITY): Payer: PPO | Admitting: Nurse Practitioner

## 2019-08-01 DIAGNOSIS — E1122 Type 2 diabetes mellitus with diabetic chronic kidney disease: Secondary | ICD-10-CM

## 2019-08-01 DIAGNOSIS — D500 Iron deficiency anemia secondary to blood loss (chronic): Secondary | ICD-10-CM

## 2019-08-01 DIAGNOSIS — I129 Hypertensive chronic kidney disease with stage 1 through stage 4 chronic kidney disease, or unspecified chronic kidney disease: Secondary | ICD-10-CM | POA: Diagnosis not present

## 2019-08-01 DIAGNOSIS — M792 Neuralgia and neuritis, unspecified: Secondary | ICD-10-CM | POA: Diagnosis not present

## 2019-08-01 DIAGNOSIS — I480 Paroxysmal atrial fibrillation: Secondary | ICD-10-CM

## 2019-08-01 DIAGNOSIS — K5900 Constipation, unspecified: Secondary | ICD-10-CM

## 2019-08-01 DIAGNOSIS — N183 Chronic kidney disease, stage 3 unspecified: Secondary | ICD-10-CM

## 2019-08-01 NOTE — Assessment & Plan Note (Signed)
Heart rate is in control, continue Metoprolol, Eliquis.  

## 2019-08-01 NOTE — Progress Notes (Signed)
Location:   [facility] Room Number: 35 Place of Service:  SNF (31) Provider:  Alishia Lebo, NP  Virgie Dad, MD  Patient Care Team: Virgie Dad, MD as PCP - General (Internal Medicine) Buford Dresser, MD as PCP - Cardiology (Cardiology) Ricard Dillon, MD as Consulting Physician (Internal Medicine)  Extended Emergency Contact Information Primary Emergency Contact: Gordon,[place] Address: [address]          [place], [place] [address] [name] of [place] Phone: [phone] Mobile Phone: [phone] Relation: Daughter  Code Status:  DNR Goals of care: Advanced Directive information Advanced Directives 08/01/2019  Does Patient Have a Medical Advance Directive? Yes  Type of Advance Directive Out of facility DNR (pink MOST or yellow form);Living will;Healthcare Power of Attorney  Does patient want to make changes to medical advance directive? No - Patient declined  Copy of [place] in Chart? No - copy requested  Pre-existing out of facility DNR order (yellow form or pink MOST form) -     Chief Complaint  Patient presents with  . Medical Management of Chronic Issues    Routine Visit  . Health Maintenance    Discuss the need for Foot Exam, Urine Microalbumin, and Hemoglobin A1C    HPI:  Pt is a 86 y.o. male seen today for medical management of chronic diseases.    The patient resides in SNF [facility] for safety, care assistance, he is s/p AKA, healing nicely. Hx of Afib, heart rate is controlled, on Eliquis 2.5mg  bid, Metoprolol 25mg  bid. Anemia, stable, on Fe. Peripheral neuropathy/pantom pain, well controlled on Gabapentin 100mg  qd. T2DM, blood sugar is controlled on Metformin 500mg  bid. Constipation, stable, on Senokot S II qd. HTN, blood pressure is controlled.    Past Medical History:  Diagnosis Date  . BPH (benign prostatic hyperplasia) 10/14/2014  . Cervicalgia 01/04/2016  . Chronic kidney disease   .  Contusion of left shoulder 05/27/14  . DM type 2 (diabetes mellitus, type 2) ([facility])   . Dysphagia   . High blood pressure   . History of basal cell cancer 11/21/2005   Right temple  . History of colonic polyps 10/14/2003  . Hyperlipidemia   . Left knee pain 05/27/14  . Peripheral neuropathic pain   . Peripheral vascular disease ([facility])   . SAH (subarachnoid hemorrhage) ([facility]) 2009   TBI  . Stroke ([facility])   . TIA (transient ischemic attack) 11/06/2005   Left eye pain. Slurred speech. Diaphoresis. No residual effects.    Past Surgical History:  Procedure Laterality Date  . ABDOMINAL AORTOGRAM W/LOWER EXTREMITY N/A 06/12/2018   Procedure: ABDOMINAL AORTOGRAM W/LOWER EXTREMITY;  Surgeon: Marty Heck, MD;  Location: [facility];  Service: Cardiovascular;  Laterality: N/A;  . Achilles tendon repair Left 1998   ruptured tendon  . AMPUTATION Left 10/11/2018   Procedure: Left  AMPUTATION ABOVE KNEE;  Surgeon: Marty Heck, MD;  Location: [facility];  Service: Vascular;  Laterality: Left;  . AMPUTATION Right 02/07/2019   Procedure: AMPUTATION ABOVE KNEE;  Surgeon: Rosetta Posner, MD;  Location: [facility];  Service: Vascular;  Laterality: Right;  . COLONOSCOPY  1992   Polyp removed  . COLONOSCOPY  1998   normal  . ORIF FEMUR FRACTURE Right 1929  . PERIPHERAL VASCULAR INTERVENTION Left 06/12/2018   Procedure: PERIPHERAL VASCULAR INTERVENTION;  Surgeon: Marty Heck, MD;  Location: [facility];  Service: Cardiovascular;  Laterality: Left;  Attempted   . TONSILLECTOMY      Allergies  Allergen Reactions  . Tape Other (See Comments)    TAPE WILL TEAR THE SKIN!!!!    Allergies as of 08/01/2019      Reactions   Tape Other (See Comments)   TAPE WILL TEAR THE SKIN!!!!      Medication List       Accurate as of August 01, 2019  5:08 PM. If you have any questions, ask your nurse or doctor.        Acetaminophen 8 Hour 650 MG CR tablet Generic drug: acetaminophen Take  650 mg by mouth every 8 (eight) hours as needed for pain.   bisacodyl 10 MG suppository Commonly known as: DULCOLAX Place 10 mg rectally daily as needed for moderate constipation.   Calcium Carbonate-Vitamin D 600-400 MG-UNIT tablet Take 1 tablet by mouth daily. Morning: 7am-11am.   D3-1000 25 MCG (1000 UT) tablet Generic drug: Cholecalciferol Take 1,000 Units by mouth daily. Morning 7am-11am.   Eliquis 2.5 MG Tabs tablet Generic drug: apixaban Take 2.5 mg by mouth 2 (two) times daily. Morning: 7am-11am Evening: 7:30pm-11pm   feeding supplement (PRO-STAT SUGAR FREE 64) Liqd Take 30 mLs by mouth daily. In the morning between the hours of 7am-11am   ferrous sulfate 325 (65 FE) MG tablet Take 325 mg by mouth. Once on Mon, Wed and Fri with meals at 1pm.   gabapentin 100 MG capsule Commonly known as: NEURONTIN Take 100 mg by mouth at bedtime. 7:30pm-11pm. What changed: Another medication with the same name was removed. Continue taking this medication, and follow the directions you see here. Changed by: Anallely Rosell X Carman Essick, NP   metFORMIN 500 MG tablet Commonly known as: GLUCOPHAGE Take 500 mg by mouth 2 (two) times daily with a meal. Morning: 7am-9am Evening: 5pm-7pm. What changed: Another medication with the same name was removed. Continue taking this medication, and follow the directions you see here. Changed by: Mayli Covington X Ladarrian Asencio, NP   metoprolol tartrate 25 MG tablet Commonly known as: LOPRESSOR Take 25 mg by mouth 2 (two) times daily. Morning: 7am-11am Evening: 7pm-11pm What changed: Another medication with the same name was removed. Continue taking this medication, and follow the directions you see here. Changed by: Keiyana Stehr X Naveena Eyman, NP   Propylene Glycol-Glycerin 1-0.3 % Soln Place 2 drops into both eyes daily as needed (dry eyes).   sennosides-docusate sodium 8.6-50 MG tablet Commonly known as: SENOKOT-S Take 2 tablets by mouth at bedtime.   simvastatin 10 MG tablet Commonly known  as: ZOCOR Take 10 mg by mouth daily. Between the hours of 5pm-7pm. What changed: Another medication with the same name was removed. Continue taking this medication, and follow the directions you see here. Changed by: Terral Cooks X Jordell Outten, NP   zinc oxide 20 % ointment Apply 1 application topically as needed for irritation.       Review of Systems  Constitutional: Negative for activity change, appetite change, chills, diaphoresis, fatigue, fever and unexpected weight change.  HENT: Positive for hearing loss and trouble swallowing. Negative for congestion and voice change.   Respiratory: Positive for cough. Negative for shortness of breath and wheezing.   Cardiovascular: Negative for chest pain, palpitations and leg swelling.  Gastrointestinal: Negative for abdominal distention, abdominal pain, constipation, diarrhea, nausea and vomiting.  Genitourinary: Negative for difficulty urinating, dysuria and urgency.  Musculoskeletal: Positive for gait problem.  Skin: Negative for color change and pallor.  Neurological: Negative for dizziness, speech difficulty, weakness and  headaches.  Psychiatric/Behavioral: Negative for agitation, behavioral problems, hallucinations and sleep disturbance. The patient is not nervous/anxious.     Immunization History  Administered Date(s) Administered  . DTaP 12/31/2012  . Influenza, High Dose Seasonal PF 04/04/2017, 04/10/2019  . Influenza,inj,Quad PF,6+ Mos 03/28/2018  . Influenza-Unspecified 04/10/2014, 03/25/2015, 04/06/2016  . Moderna SARS-COVID-2 Vaccination 06/30/2019, 07/28/2019  . PPD Test 04/29/2014  . Pneumococcal Conjugate-13 04/28/2017  . Pneumococcal-Unspecified 03/13/2011  . Td 12/24/2012  . Tdap 04/28/2017  . Zoster Recombinat (Shingrix) 12/14/2017   Pertinent  Health Maintenance Due  Topic Date Due  . FOOT EXAM  06/27/2018  . URINE MICROALBUMIN  03/14/2019  . HEMOGLOBIN A1C  06/18/2019  . OPHTHALMOLOGY EXAM  07/01/2020  . INFLUENZA VACCINE   Completed  . PNA vac Low Risk Adult  Completed   Fall Risk  10/02/2018 09/11/2018 08/28/2018 08/08/2018 08/07/2018  Falls in the past year? 0 0 0 0 0  Number falls in past yr: 0 0 0 - 0  Injury with Fall? 0 0 0 - 0  Risk for fall due to : - - - - -   Functional Status Survey:    Vitals:   08/01/19 0947  BP: (!) 113/58  Pulse: 76  Resp: 16  Temp: (!) 97.4 F (36.3 C)  SpO2: 93%  Weight: 116 lb (52.6 kg)  Height: 5\' 8"  (1.727 m)   Body mass index is 17.64 kg/m. Physical Exam Vitals and nursing note reviewed.  Constitutional:      General: He is not in acute distress.    Appearance: Normal appearance. He is not ill-appearing, toxic-appearing or diaphoretic.  HENT:     Head: Normocephalic and atraumatic.     Nose: Nose normal.     Mouth/Throat:     Mouth: Mucous membranes are moist.  Eyes:     Extraocular Movements: Extraocular movements intact.     Conjunctiva/sclera: Conjunctivae normal.     Pupils: Pupils are equal, round, and reactive to light.  Cardiovascular:     Rate and Rhythm: Normal rate and regular rhythm.     Heart sounds: Murmur present.  Pulmonary:     Breath sounds: No wheezing, rhonchi or rales.  Abdominal:     General: Bowel sounds are normal. There is no distension.     Palpations: Abdomen is soft.     Tenderness: There is no abdominal tenderness. There is no right CVA tenderness, left CVA tenderness, guarding or rebound.  Musculoskeletal:     Cervical back: Normal range of motion and neck supple.     Right lower leg: No edema.     Left lower leg: No edema.     Comments: S/p Bil AKA  Skin:    General: Skin is warm and dry.  Neurological:     General: No focal deficit present.     Mental Status: He is alert and oriented to person, place, and time. Mental status is at baseline.     Motor: No weakness.     Coordination: Coordination normal.     Gait: Gait abnormal.  Psychiatric:        Mood and Affect: Mood normal.        Behavior: Behavior normal.         Thought Content: Thought content normal.        Judgment: Judgment normal.     Labs reviewed: Recent Labs    02/08/19 0245 02/08/19 0245 02/09/19 0311 02/09/19 0311 02/10/19 0402 02/11/19 0000 02/25/19 0000  NA 129*   < >  129*   < > 128* 129* 129*  K 4.7   < > 4.4   < > 4.5 4.7 4.3  CL 94*   < > 98   < > 95* 96* 95*  CO2 22   < > 23   < > 25 26* 29*  GLUCOSE 242*  --  113*  --  176*  --   --   BUN 20   < > 27*   < > 21 20 22*  CREATININE 1.22   < > 1.21   < > 0.98 0.9 0.9  CALCIUM 8.5*   < > 8.2*   < > 8.1* 8.1* 8.9   < > = values in this interval not displayed.   Recent Labs    08/29/18 0815 08/29/18 0815 10/09/18 1352 10/09/18 1352 10/09/18 1920 10/17/18 0000 10/22/18 0000  AST 15   < > 23   < > 22 22 16   ALT 13   < > 22   < > 21 14 11   ALKPHOS  --   --  64   < > 68 67 69  BILITOT 0.7  --  0.8  --  0.7  --   --   PROT 6.0*   < > 6.3*  --  6.2* 4.6  --   ALBUMIN  --   --  2.5*  --  2.5* 2.2  --    < > = values in this interval not displayed.   Recent Labs    08/29/18 0815 10/09/18 1352 02/07/19 1233 02/07/19 1233 02/08/19 0245 02/08/19 0245 02/09/19 0311 02/11/19 0000 02/25/19 0000  WBC 11.7*   < > 9.7   < > 14.3*   < > 14.5* 9.7 7.5  NEUTROABS 8,471*  --   --   --   --   --  10.9*  --  3,420  HGB 12.0*   < > 11.8*   < > 11.6*   < > 9.5* 9.8* 10.8*  HCT 34.5*   < > 34.4*   < > 33.4*   < > 27.5* 29* 32*  MCV 99.7   < > 97.5  --  96.8  --  96.8  --   --   PLT 455*   < > 448*   < > 454*   < > 459* 438* 494*   < > = values in this interval not displayed.   Lab Results  Component Value Date   TSH 1.69 10/24/2018   Lab Results  Component Value Date   HGBA1C 6.8 12/17/2018   Lab Results  Component Value Date   CHOL 126 08/29/2018   HDL 49 08/29/2018   LDLCALC 59 08/29/2018   TRIG 95 08/29/2018   CHOLHDL 2.6 08/29/2018    Significant Diagnostic Results in last 30 days:  No results found.  Assessment/Plan Benign hypertension with chronic  kidney disease, stage III (HCC) Blood pressure is controlled, continue Metoprolol.   Paroxysmal atrial fibrillation (HCC) Heart rate is in control, continue Metoprolol, Eliquis.   Controlled type 2 diabetes mellitus with stage 3 chronic kidney disease, without long-term current use of insulin (HCC) Blood sugar is controlled, continue Metformin.   Blood loss anemia Stable, continue Fe  Constipation Stable, continue Senokot S  Neuropathic pain Stable, continue Gabapentin.      Family/ staff Communication: plan of care reviewed with the patient and charge nurse.   Labs/tests ordered:  none  Time spend 25 minutes.

## 2019-08-01 NOTE — Assessment & Plan Note (Signed)
Blood pressure is controlled, continue Metoprolol. 

## 2019-08-01 NOTE — Assessment & Plan Note (Signed)
Stable, continue Senokot S

## 2019-08-01 NOTE — Assessment & Plan Note (Signed)
Blood sugar is controlled, continue Metformin.

## 2019-08-01 NOTE — Assessment & Plan Note (Signed)
Stable, continue Gabapentin.

## 2019-08-01 NOTE — Assessment & Plan Note (Signed)
Stable, continue Fe 

## 2019-08-07 DIAGNOSIS — Z20828 Contact with and (suspected) exposure to other viral communicable diseases: Secondary | ICD-10-CM | POA: Diagnosis not present

## 2019-08-12 DIAGNOSIS — Z20828 Contact with and (suspected) exposure to other viral communicable diseases: Secondary | ICD-10-CM | POA: Diagnosis not present

## 2019-08-19 DIAGNOSIS — Z20828 Contact with and (suspected) exposure to other viral communicable diseases: Secondary | ICD-10-CM | POA: Diagnosis not present

## 2019-08-26 DIAGNOSIS — Z20828 Contact with and (suspected) exposure to other viral communicable diseases: Secondary | ICD-10-CM | POA: Diagnosis not present

## 2019-08-28 ENCOUNTER — Ambulatory Visit: Payer: PPO | Admitting: Cardiology

## 2019-08-28 ENCOUNTER — Encounter: Payer: Self-pay | Admitting: Cardiology

## 2019-08-28 ENCOUNTER — Other Ambulatory Visit: Payer: Self-pay

## 2019-08-28 VITALS — BP 120/60 | HR 70 | Temp 97.5°F

## 2019-08-28 DIAGNOSIS — I480 Paroxysmal atrial fibrillation: Secondary | ICD-10-CM | POA: Diagnosis not present

## 2019-08-28 DIAGNOSIS — I739 Peripheral vascular disease, unspecified: Secondary | ICD-10-CM | POA: Diagnosis not present

## 2019-08-28 DIAGNOSIS — I10 Essential (primary) hypertension: Secondary | ICD-10-CM

## 2019-08-28 DIAGNOSIS — N183 Chronic kidney disease, stage 3 unspecified: Secondary | ICD-10-CM

## 2019-08-28 DIAGNOSIS — E1122 Type 2 diabetes mellitus with diabetic chronic kidney disease: Secondary | ICD-10-CM | POA: Diagnosis not present

## 2019-08-28 DIAGNOSIS — E785 Hyperlipidemia, unspecified: Secondary | ICD-10-CM

## 2019-08-28 DIAGNOSIS — Z89611 Acquired absence of right leg above knee: Secondary | ICD-10-CM

## 2019-08-28 DIAGNOSIS — Z89612 Acquired absence of left leg above knee: Secondary | ICD-10-CM | POA: Diagnosis not present

## 2019-08-28 DIAGNOSIS — Z8673 Personal history of transient ischemic attack (TIA), and cerebral infarction without residual deficits: Secondary | ICD-10-CM

## 2019-08-28 NOTE — Patient Instructions (Signed)
Medication Instructions:  The current medical regimen is effective;  continue present plan and medications.  *If you need a refill on your cardiac medications before your next appointment, please call your pharmacy*   Follow-Up: At [facility], you and your health needs are our priority.  As part of our continuing mission to provide you with exceptional heart care, we have created designated Provider Care Teams.  These Care Teams include your primary Cardiologist (physician) and Advanced Practice Providers (APPs -  Physician Assistants and Nurse Practitioners) who all work together to provide you with the care you need, when you need it.  We recommend signing up for the patient portal called "MyChart".  Sign up information is provided on this After Visit Summary.  MyChart is used to connect with patients for Virtual Visits (Telemedicine).  Patients are able to view lab/test results, encounter notes, upcoming appointments, etc.  Non-urgent messages can be sent to your provider as well.   To learn more about what you can do with MyChart, go to [email or web address].    Your next appointment:   12 month(s)  The format for your next appointment:   In Person  Provider:   Buford Dresser, MD

## 2019-08-28 NOTE — Progress Notes (Signed)
Cardiology Office Note:    Date:  08/28/2019   ID:  [name], DOB [birth year], MRN [id number]  PCP:  Virgie Dad, MD  Cardiologist:  Buford Dresser, MD PhD  Referring MD: Virgie Dad, MD   CC: follow up  History of Present Illness:    Nathan Cook is a 86 y.o. male with a hx of HTN, HLD, type II diabetes, peripheral vascular disease, CVA s/p tPA who is seen for follow up today.  Cardiac history: history of PVD, with critical limb ischemia and nonhealing foot wound s/p vascular surgery 06/12/18, but unfortunately the distal vessels could not be opened. He presented with acute CVA 03/2018 and received tPA for brainstem infarct. He had newly diagnosed atrial fibrillation during that hospitalization.  Today: Here with his daughter today. Overall feels like he is doing well from a cardiac perspective. Living at [facility], staying involved with activities. He and his wife are in separate rooms at [facility] (she is physically healthy but has dementia).   S/P bilateral AKA (L AKA 10/11/18, R AKA 02/07/19). Doing well with this. Rare R stump pain related to movement, mild.   Denies chest pain, shortness of breath at rest. No PND, orthopnea, or unexpected weight gain. No syncope or palpitations.  Past Medical History:  Diagnosis Date  . BPH (benign prostatic hyperplasia) 10/14/2014  . Cervicalgia 01/04/2016  . Chronic kidney disease   . Contusion of left shoulder 05/27/14  . DM type 2 (diabetes mellitus, type 2) ([place])   . Dysphagia   . High blood pressure   . History of basal cell cancer 11/21/2005   Right temple  . History of colonic polyps 10/14/2003  . Hyperlipidemia   . Left knee pain 05/27/14  . Peripheral neuropathic pain   . Peripheral vascular disease ([place])   . SAH (subarachnoid hemorrhage) ([place]) 2009   TBI  . Stroke ([place])   . TIA (transient ischemic attack) 11/06/2005   Left eye pain. Slurred speech. Diaphoresis. No residual effects.     Past  Surgical History:  Procedure Laterality Date  . ABDOMINAL AORTOGRAM W/LOWER EXTREMITY N/A 06/12/2018   Procedure: ABDOMINAL AORTOGRAM W/LOWER EXTREMITY;  Surgeon: Marty Heck, MD;  Location: [place] [facility];  Service: Cardiovascular;  Laterality: N/A;  . Achilles tendon repair Left 1998   ruptured tendon  . AMPUTATION Left 10/11/2018   Procedure: Left  AMPUTATION ABOVE KNEE;  Surgeon: Marty Heck, MD;  Location: [place];  Service: Vascular;  Laterality: Left;  . AMPUTATION Right 02/07/2019   Procedure: AMPUTATION ABOVE KNEE;  Surgeon: Rosetta Posner, MD;  Location: [place];  Service: Vascular;  Laterality: Right;  . COLONOSCOPY  1992   Polyp removed  . COLONOSCOPY  1998   normal  . ORIF FEMUR FRACTURE Right 1929  . PERIPHERAL VASCULAR INTERVENTION Left 06/12/2018   Procedure: PERIPHERAL VASCULAR INTERVENTION;  Surgeon: Marty Heck, MD;  Location: [place] [facility];  Service: Cardiovascular;  Laterality: Left;  Attempted   . TONSILLECTOMY      Current Medications: Current Outpatient Medications on File Prior to Visit  Medication Sig  . acetaminophen (ACETAMINOPHEN 8 HOUR) 650 MG CR tablet Take 650 mg by mouth every 8 (eight) hours as needed for pain.  . Amino Acids-Protein Hydrolys (FEEDING SUPPLEMENT, PRO-STAT SUGAR FREE 64,) LIQD Take 30 mLs by mouth daily. In the morning between the hours of 7am-11am  . apixaban (ELIQUIS) 2.5 MG TABS tablet Take 2.5 mg by mouth  2 (two) times daily. Morning: 7am-11am Evening: 7:30pm-11pm  . bisacodyl (DULCOLAX) 10 MG suppository Place 10 mg rectally daily as needed for moderate constipation.  . Calcium Carbonate-Vitamin D 600-400 MG-UNIT tablet Take 1 tablet by mouth daily. Morning: 7am-11am.  . Cholecalciferol (D3-1000) 25 MCG (1000 UT) tablet Take 1,000 Units by mouth daily. Morning 7am-11am.  . ferrous sulfate 325 (65 FE) MG tablet Take 325 mg by mouth. Once on Mon, Wed and Fri with meals at 1pm.  . gabapentin (NEURONTIN)  100 MG capsule Take 100 mg by mouth at bedtime. 7:30pm-11pm.  . metFORMIN (GLUCOPHAGE) 500 MG tablet Take 500 mg by mouth 2 (two) times daily with a meal. Morning: 7am-9am Evening: 5pm-7pm.  . metoprolol tartrate (LOPRESSOR) 25 MG tablet Take 25 mg by mouth 2 (two) times daily. Morning: 7am-11am Evening: 7pm-11pm  . Propylene Glycol-Glycerin 1-0.3 % SOLN Place 2 drops into both eyes daily as needed (dry eyes).   . sennosides-docusate sodium (SENOKOT-S) 8.6-50 MG tablet Take 2 tablets by mouth at bedtime.   . simvastatin (ZOCOR) 10 MG tablet Take 10 mg by mouth daily. Between the hours of 5pm-7pm.  . zinc oxide 20 % ointment Apply 1 application topically as needed for irritation.   No current facility-administered medications on file prior to visit.     Allergies:   Tape   Social History   Tobacco Use  . Smoking status: Former Smoker    Quit date: 06/26/1971    Years since quitting: 48.2  . Smokeless tobacco: Never Used  . Tobacco comment: 2 1/2 packs a day, until 1972  Substance Use Topics  . Alcohol use: Not Currently    Alcohol/week: 1.0 - 2.0 standard drinks    Types: 1 - 2 Glasses of wine per week    Comment: Wine nightly  . Drug use: No    Family History: The patient's family history includes Heart attack in his mother; Heart disease in his father and mother.  ROS:   Please see the history of present illness.  Additional pertinent ROS otherwise unremarkable.   EKGs/Labs/Other Studies Reviewed:    The following studies were reviewed today: Echo 04/02/18 Study Conclusions - Left ventricle: The cavity size was normal. Wall thickness was   normal. Systolic function was normal. The estimated ejection   fraction was in the range of 60% to 65%. Wall motion was normal;   there were no regional wall motion abnormalities. Doppler   parameters are consistent with abnormal left ventricular   relaxation (grade 1 diastolic dysfunction). - Aortic valve: Valve mobility was  restricted. There was moderate   stenosis. There was mild regurgitation. Valve area (VTI): 1.04   cm^2. Valve area (Vmax): 0.82 cm^2. Valve area (Vmean): 0.99   cm^2.  Impressions: - Normal LV systolic function; mild diastolic dysfunction;   calcified aortic valve with moderate AS (mean gradient 21 mmHg)   and mild AI.  EKG:  EKG is personally reviewed.  The ekg ordered today demonstrates sinus rhythm with RBBB and 1st degree AV block.   Recent Labs: 10/22/2018: ALT 11 10/24/2018: TSH 1.69 02/25/2019: BUN 22; Creatinine 0.9; Hemoglobin 10.8; Platelets 494; Potassium 4.3; Sodium 129  Recent Lipid Panel    Component Value Date/Time   CHOL 126 08/29/2018 0815   TRIG 95 08/29/2018 0815   HDL 49 08/29/2018 0815   CHOLHDL 2.6 08/29/2018 0815   VLDL 6 04/02/2018 0259   LDLCALC 59 08/29/2018 0815    Physical Exam:    VS:  BP  120/60   Pulse 70   Temp (!) 97.5 F (36.4 C)   SpO2 97%     Wt Readings from Last 3 Encounters:  08/01/19 116 lb (52.6 kg)  07/14/19 112 lb 9.6 oz (51.1 kg)  07/02/19 112 lb 9.6 oz (51.1 kg)    GEN: Well nourished, well developed in no acute distress HEENT: Normal, moist mucous membranes NECK: No JVD CARDIAC: regular rhythm, normal S1 and S2, no rubs or gallops. 3/6 SEM with preserved S@. VASCULAR: Radial pulses 2+ bilaterally. RESPIRATORY:  Clear to auscultation without rales, wheezing or rhonchi  ABDOMEN: Soft, non-tender, non-distended MUSCULOSKELETAL:  S/P Bilateral AKA SKIN: Warm and dry NEUROLOGIC:  Alert and oriented x 3. No focal neuro deficits noted. PSYCHIATRIC:  Normal affect   ASSESSMENT:    1. PAD (peripheral artery disease) ([place])   2. S/P AKA (above knee amputation) bilateral (HCC)   3. Paroxysmal atrial fibrillation ([place])   4. History of CVA (cerebrovascular accident)   5. Essential hypertension   6. Hyperlipidemia LDL goal <70   7. Controlled type 2 diabetes mellitus with stage 3 chronic kidney disease, without long-term current  use of insulin (HCC)    PLAN:    PAD, with history of critical limb ischemia: s/p L AKA and R AKA in 2020 -doing well overall, rare pain related to movement on R side -on DOAC, not aspirin -on simvastatin  Paroxysmal atrial fibrillation, diagnosed at the time of CVA:  -CHA2DS2/VAS Stroke Risk Points  = 7   -continue apixaban for anticoagulation, 2.5 mg BID dosing due to age/weight -tolerating metoprolol for rate control of afib -in sinus rhythm today  History of CVA:  -on statin for secondary prevention, LDL goal <70. Last LDL 59 08/29/18 on simvastatin 10 mg -afib management as above  Hypertension goal <130/80, at goal today -no longer on losartan 25 mg daily -continue metoprolol 25 mg BID   Type II diabetes: on metformin, per PCP.  Chronic kidney disease, stage 3: avoid nephrotoxic agents.  Plan for follow up: 1 year or sooner PRN  Medication Adjustments/Labs and Tests Ordered: Current medicines are reviewed at length with the patient today.  Concerns regarding medicines are outlined above.  No orders of the defined types were placed in this encounter.  No orders of the defined types were placed in this encounter.   Patient Instructions  Medication Instructions:  The current medical regimen is effective;  continue present plan and medications.  *If you need a refill on your cardiac medications before your next appointment, please call your pharmacy*   Follow-Up: At [facility], you and your health needs are our priority.  As part of our continuing mission to provide you with exceptional heart care, we have created designated Provider Care Teams.  These Care Teams include your primary Cardiologist (physician) and Advanced Practice Providers (APPs -  Physician Assistants and Nurse Practitioners) who all work together to provide you with the care you need, when you need it.  We recommend signing up for the patient portal called "MyChart".  Sign up information is provided  on this After Visit Summary.  MyChart is used to connect with patients for Virtual Visits (Telemedicine).  Patients are able to view lab/test results, encounter notes, upcoming appointments, etc.  Non-urgent messages can be sent to your provider as well.   To learn more about what you can do with MyChart, go to [email or web address].    Your next appointment:   12 month(s)  The format for your  next appointment:   In Person  Provider:   Buford Dresser, MD       Signed, Buford Dresser, MD PhD 08/28/2019 12:22 PM    [place]

## 2019-09-05 DIAGNOSIS — M792 Neuralgia and neuritis, unspecified: Secondary | ICD-10-CM | POA: Diagnosis not present

## 2019-09-05 DIAGNOSIS — M79605 Pain in left leg: Secondary | ICD-10-CM | POA: Diagnosis not present

## 2019-09-05 DIAGNOSIS — R2681 Unsteadiness on feet: Secondary | ICD-10-CM | POA: Diagnosis not present

## 2019-09-05 DIAGNOSIS — S91101S Unspecified open wound of right great toe without damage to nail, sequela: Secondary | ICD-10-CM | POA: Diagnosis not present

## 2019-09-05 DIAGNOSIS — R41841 Cognitive communication deficit: Secondary | ICD-10-CM | POA: Diagnosis not present

## 2019-09-05 DIAGNOSIS — I998 Other disorder of circulatory system: Secondary | ICD-10-CM | POA: Diagnosis not present

## 2019-09-05 DIAGNOSIS — E1122 Type 2 diabetes mellitus with diabetic chronic kidney disease: Secondary | ICD-10-CM | POA: Diagnosis not present

## 2019-09-05 DIAGNOSIS — M6281 Muscle weakness (generalized): Secondary | ICD-10-CM | POA: Diagnosis not present

## 2019-09-05 DIAGNOSIS — E785 Hyperlipidemia, unspecified: Secondary | ICD-10-CM | POA: Diagnosis not present

## 2019-09-05 DIAGNOSIS — I739 Peripheral vascular disease, unspecified: Secondary | ICD-10-CM | POA: Diagnosis not present

## 2019-09-05 DIAGNOSIS — N189 Chronic kidney disease, unspecified: Secondary | ICD-10-CM | POA: Diagnosis not present

## 2019-09-05 DIAGNOSIS — Z89612 Acquired absence of left leg above knee: Secondary | ICD-10-CM | POA: Diagnosis not present

## 2019-09-05 DIAGNOSIS — Z8673 Personal history of transient ischemic attack (TIA), and cerebral infarction without residual deficits: Secondary | ICD-10-CM | POA: Diagnosis not present

## 2019-09-05 DIAGNOSIS — Z85828 Personal history of other malignant neoplasm of skin: Secondary | ICD-10-CM | POA: Diagnosis not present

## 2019-09-05 DIAGNOSIS — E1151 Type 2 diabetes mellitus with diabetic peripheral angiopathy without gangrene: Secondary | ICD-10-CM | POA: Diagnosis not present

## 2019-09-05 DIAGNOSIS — I129 Hypertensive chronic kidney disease with stage 1 through stage 4 chronic kidney disease, or unspecified chronic kidney disease: Secondary | ICD-10-CM | POA: Diagnosis not present

## 2019-09-05 DIAGNOSIS — R634 Abnormal weight loss: Secondary | ICD-10-CM | POA: Diagnosis not present

## 2019-09-05 DIAGNOSIS — H04129 Dry eye syndrome of unspecified lacrimal gland: Secondary | ICD-10-CM | POA: Diagnosis not present

## 2019-09-05 DIAGNOSIS — D500 Iron deficiency anemia secondary to blood loss (chronic): Secondary | ICD-10-CM | POA: Diagnosis not present

## 2019-09-05 DIAGNOSIS — Z2089 Contact with and (suspected) exposure to other communicable diseases: Secondary | ICD-10-CM | POA: Diagnosis not present

## 2019-09-12 ENCOUNTER — Encounter: Payer: Self-pay | Admitting: Nurse Practitioner

## 2019-09-12 ENCOUNTER — Non-Acute Institutional Stay (SKILLED_NURSING_FACILITY): Payer: PPO | Admitting: Nurse Practitioner

## 2019-09-12 DIAGNOSIS — E1122 Type 2 diabetes mellitus with diabetic chronic kidney disease: Secondary | ICD-10-CM

## 2019-09-12 DIAGNOSIS — R809 Proteinuria, unspecified: Secondary | ICD-10-CM

## 2019-09-12 DIAGNOSIS — I480 Paroxysmal atrial fibrillation: Secondary | ICD-10-CM

## 2019-09-12 DIAGNOSIS — E1129 Type 2 diabetes mellitus with other diabetic kidney complication: Secondary | ICD-10-CM | POA: Diagnosis not present

## 2019-09-12 DIAGNOSIS — I129 Hypertensive chronic kidney disease with stage 1 through stage 4 chronic kidney disease, or unspecified chronic kidney disease: Secondary | ICD-10-CM

## 2019-09-12 DIAGNOSIS — D500 Iron deficiency anemia secondary to blood loss (chronic): Secondary | ICD-10-CM | POA: Diagnosis not present

## 2019-09-12 DIAGNOSIS — M792 Neuralgia and neuritis, unspecified: Secondary | ICD-10-CM | POA: Diagnosis not present

## 2019-09-12 DIAGNOSIS — N183 Chronic kidney disease, stage 3 unspecified: Secondary | ICD-10-CM

## 2019-09-12 NOTE — Progress Notes (Signed)
Location:   [facility] Room Number: 35 Place of Service:  SNF (31) Provider:  Sanaya Gwilliam, NP  Virgie Dad, MD  Patient Care Team: Virgie Dad, MD as PCP - General (Internal Medicine) Buford Dresser, MD as PCP - Cardiology (Cardiology) Ricard Dillon, MD as Consulting Physician (Internal Medicine)  Extended Emergency Contact Information Primary Emergency Contact: Gordon,[place] Address: [address]          [place], [place] [address] [name] of [place] Phone: [phone] Mobile Phone: [phone] Relation: Daughter  Code Status:  DNR Goals of care: Advanced Directive information Advanced Directives 09/12/2019  Does Patient Have a Medical Advance Directive? -  Type of Paramedic of [place];Living will;Out of facility DNR (pink MOST or yellow form)  Does patient want to make changes to medical advance directive? No - Patient declined  Copy of [place] in Chart? Yes - validated most recent copy scanned in chart (See row information)  Pre-existing out of facility DNR order (yellow form or pink MOST form) -     Chief Complaint  Patient presents with  . Medical Management of Chronic Issues    Routine Visit  . Health Maintenance    Discuss the need for Foot Exam, Urine Microalbumin, and Hemoglobin A1C    HPI:  Pt is a 86 y.o. male seen today for medical management of chronic diseases.    The patient resides in [facility] for safety, care assistance, s/p AKA bilateral. T2DM, blood sugar is controlled on Metformin 500mg  bid, Hgb a1c. Hx of Afib, heart rate is controlled, on Metoprolol 25mg  bid, Eliquis 2.5mg  bid. Anemia, stable, on Fe. Phantom pain is well controlled on Gabapentin 100mg  qhs.    Past Medical History:  Diagnosis Date  . BPH (benign prostatic hyperplasia) 10/14/2014  . Cervicalgia 01/04/2016  . Chronic kidney disease   . Contusion of left shoulder 05/27/14  . DM type 2  (diabetes mellitus, type 2) ([place])   . Dysphagia   . High blood pressure   . History of basal cell cancer 11/21/2005   Right temple  . History of colonic polyps 10/14/2003  . Hyperlipidemia   . Left knee pain 05/27/14  . Peripheral neuropathic pain   . Peripheral vascular disease ([place])   . SAH (subarachnoid hemorrhage) ([place]) 2009   TBI  . Stroke ([place])   . TIA (transient ischemic attack) 11/06/2005   Left eye pain. Slurred speech. Diaphoresis. No residual effects.    Past Surgical History:  Procedure Laterality Date  . ABDOMINAL AORTOGRAM W/LOWER EXTREMITY N/A 06/12/2018   Procedure: ABDOMINAL AORTOGRAM W/LOWER EXTREMITY;  Surgeon: Marty Heck, MD;  Location: [place] [facility];  Service: Cardiovascular;  Laterality: N/A;  . Achilles tendon repair Left 1998   ruptured tendon  . AMPUTATION Left 10/11/2018   Procedure: Left  AMPUTATION ABOVE KNEE;  Surgeon: Marty Heck, MD;  Location: [facility];  Service: Vascular;  Laterality: Left;  . AMPUTATION Right 02/07/2019   Procedure: AMPUTATION ABOVE KNEE;  Surgeon: Rosetta Posner, MD;  Location: [place];  Service: Vascular;  Laterality: Right;  . COLONOSCOPY  1992   Polyp removed  . COLONOSCOPY  1998   normal  . ORIF FEMUR FRACTURE Right 1929  . PERIPHERAL VASCULAR INTERVENTION Left 06/12/2018   Procedure: PERIPHERAL VASCULAR INTERVENTION;  Surgeon: Marty Heck, MD;  Location: [place] [facility];  Service: Cardiovascular;  Laterality: Left;  Attempted   . TONSILLECTOMY  Allergies  Allergen Reactions  . Tape Other (See Comments)    TAPE WILL TEAR THE SKIN!!!!    Allergies as of 09/12/2019      Reactions   Tape Other (See Comments)   TAPE WILL TEAR THE SKIN!!!!      Medication List       Accurate as of September 12, 2019 11:59 PM. If you have any questions, ask your nurse or doctor.        Acetaminophen 8 Hour 650 MG CR tablet Generic drug: acetaminophen Take 650 mg by mouth every 8 (eight) hours as needed  for pain.   bisacodyl 10 MG suppository Commonly known as: DULCOLAX Place 10 mg rectally daily as needed for moderate constipation.   Calcium Carbonate-Vitamin D 600-400 MG-UNIT tablet Take 1 tablet by mouth daily. Morning: 7am-11am.   D3-1000 25 MCG (1000 UT) tablet Generic drug: Cholecalciferol Take 1,000 Units by mouth daily. Morning 7am-11am.   Eliquis 2.5 MG Tabs tablet Generic drug: apixaban Take 2.5 mg by mouth 2 (two) times daily. Morning: 7am-11am Evening: 7:30pm-11pm   feeding supplement (PRO-STAT SUGAR FREE 64) Liqd Take 30 mLs by mouth daily. In the morning between the hours of 7am-11am   ferrous sulfate 325 (65 FE) MG tablet Take 325 mg by mouth. Once on Mon, Wed and Fri with meals at 1pm.   gabapentin 100 MG capsule Commonly known as: NEURONTIN Take 100 mg by mouth at bedtime. 7:30pm-11pm.   metFORMIN 500 MG tablet Commonly known as: GLUCOPHAGE Take 500 mg by mouth 2 (two) times daily with a meal. Morning: 7am-9am Evening: 5pm-7pm.   metoprolol tartrate 25 MG tablet Commonly known as: LOPRESSOR Take 25 mg by mouth 2 (two) times daily. Morning: 7am-11am Evening: 7pm-11pm   Propylene Glycol-Glycerin 1-0.3 % Soln Place 2 drops into both eyes daily as needed (dry eyes).   Propylene Glycol-Glycerin 1-0.3 % Soln Apply 2 drops to eye daily as needed.   sennosides-docusate sodium 8.6-50 MG tablet Commonly known as: SENOKOT-S Take 2 tablets by mouth at bedtime as needed.   simvastatin 10 MG tablet Commonly known as: ZOCOR Take 10 mg by mouth daily. Between the hours of 5pm-7pm.   zinc oxide 20 % ointment Apply 1 application topically as needed for irritation.       Review of Systems  Constitutional: Negative for activity change, appetite change, fatigue, fever and unexpected weight change.  HENT: Positive for hearing loss and trouble swallowing. Negative for congestion and voice change.   Respiratory: Positive for cough. Negative for shortness of  breath and wheezing.   Cardiovascular: Negative for chest pain, palpitations and leg swelling.  Gastrointestinal: Negative for abdominal distention, abdominal pain and constipation.  Genitourinary: Negative for difficulty urinating, dysuria and urgency.  Musculoskeletal: Positive for gait problem.  Skin: Negative for color change.  Neurological: Negative for speech difficulty, weakness and headaches.  Psychiatric/Behavioral: Negative for agitation, behavioral problems and sleep disturbance. The patient is not nervous/anxious.     Immunization History  Administered Date(s) Administered  . DTaP 12/31/2012  . Influenza, High Dose Seasonal PF 04/04/2017, 04/10/2019  . Influenza,inj,Quad PF,6+ Mos 03/28/2018  . Influenza-Unspecified 04/10/2014, 03/25/2015, 04/06/2016  . Moderna SARS-COVID-2 Vaccination 06/30/2019, 07/28/2019  . PPD Test 04/29/2014  . Pneumococcal Conjugate-13 04/28/2017  . Pneumococcal-Unspecified 03/13/2011  . Td 12/24/2012  . Tdap 04/28/2017  . Zoster Recombinat (Shingrix) 12/14/2017   Pertinent  Health Maintenance Due  Topic Date Due  . FOOT EXAM  06/27/2018  . URINE MICROALBUMIN  03/14/2019  .  HEMOGLOBIN A1C  10/09/2019  . OPHTHALMOLOGY EXAM  07/01/2020  . INFLUENZA VACCINE  Completed  . PNA vac Low Risk Adult  Completed   Fall Risk  10/02/2018 09/11/2018 08/28/2018 08/08/2018 08/07/2018  Falls in the past year? 0 0 0 0 0  Number falls in past yr: 0 0 0 - 0  Injury with Fall? 0 0 0 - 0  Risk for fall due to : - - - - -   Functional Status Survey:    Vitals:   09/12/19 0844  BP: (!) 100/58  Pulse: 92  Resp: 18  Temp: 98.6 F (37 C)  SpO2: 95%  Weight: 118 lb 1.6 oz (53.6 kg)  Height: 5\' 8"  (1.727 m)   Body mass index is 17.96 kg/m. Physical Exam Vitals and nursing note reviewed.  Constitutional:      General: He is not in acute distress.    Appearance: Normal appearance. He is not ill-appearing.  HENT:     Head: Normocephalic and atraumatic.      Mouth/Throat:     Mouth: Mucous membranes are moist.  Eyes:     Extraocular Movements: Extraocular movements intact.     Conjunctiva/sclera: Conjunctivae normal.     Pupils: Pupils are equal, round, and reactive to light.  Cardiovascular:     Rate and Rhythm: Normal rate and regular rhythm.     Heart sounds: Murmur present.  Pulmonary:     Breath sounds: No rales.  Abdominal:     General: Bowel sounds are normal. There is no distension.     Palpations: Abdomen is soft.     Tenderness: There is no abdominal tenderness. There is no guarding.  Musculoskeletal:     Cervical back: Normal range of motion and neck supple.     Right lower leg: No edema.     Left lower leg: No edema.     Comments: S/p Bil AKA  Skin:    General: Skin is warm and dry.  Neurological:     General: No focal deficit present.     Mental Status: He is alert and oriented to person, place, and time. Mental status is at baseline.     Motor: No weakness.     Gait: Gait abnormal.  Psychiatric:        Mood and Affect: Mood normal.        Behavior: Behavior normal.        Thought Content: Thought content normal.        Judgment: Judgment normal.     Labs reviewed: Recent Labs    02/08/19 0245 02/08/19 0245 02/09/19 0311 02/09/19 0311 02/10/19 0402 02/11/19 0000 02/25/19 0000 04/10/19 0000 05/19/19 0000  NA 129*   < > 129*   < > 128*   < > 129* 135* 137  K 4.7   < > 4.4   < > 4.5   < > 4.3 4.4 4.5  CL 94*   < > 98   < > 95*   < > 95* 99 100  CO2 22   < > 23   < > 25   < > 29* 30* 27*  GLUCOSE 242*  --  113*  --  176*  --   --   --   --   BUN 20   < > 27*   < > 21   < > 22* 27* 31*  CREATININE 1.22   < > 1.21   < > 0.98   < >  0.9 1.0 1.1  CALCIUM 8.5*   < > 8.2*   < > 8.1*   < > 8.9 8.9 9.4   < > = values in this interval not displayed.   Recent Labs    10/09/18 1352 10/09/18 1352 10/09/18 1920 10/09/18 1920 10/17/18 0000 10/17/18 0000 10/22/18 0000 04/10/19 0000 05/19/19 0000  AST 23   < >  22   < > 22   < > 16 15 15   ALT 22   < > 21   < > 14   < > 11 13 16   ALKPHOS 64   < > 68   < > 67   < > 69 64 68  BILITOT 0.8  --  0.7  --   --   --   --   --   --   PROT 6.3*  --  6.2*  --  4.6  --   --   --   --   ALBUMIN 2.5*   < > 2.5*  --  2.2  --   --  3.0* 3.3*   < > = values in this interval not displayed.   Recent Labs    02/07/19 1233 02/07/19 1233 02/08/19 0245 02/08/19 0245 02/09/19 0311 02/11/19 0000 02/25/19 0000 04/10/19 0000 05/19/19 0000  WBC 9.7   < > 14.3*   < > 14.5*   < > 7.5 6.8 6.5  NEUTROABS  --   --   --   --  10.9*  --  3,420 3,876 3,510  HGB 11.8*   < > 11.6*   < > 9.5*   < > 10.8* 11.5* 11.9*  HCT 34.4*   < > 33.4*   < > 27.5*   < > 32* 34* 35*  MCV 97.5  --  96.8  --  96.8  --   --   --   --   PLT 448*   < > 454*   < > 459*   < > 494* 403* 369   < > = values in this interval not displayed.   Lab Results  Component Value Date   TSH 1.69 10/24/2018   Lab Results  Component Value Date   HGBA1C 6.4 04/10/2019   Lab Results  Component Value Date   CHOL 126 08/29/2018   HDL 49 08/29/2018   LDLCALC 59 08/29/2018   TRIG 95 08/29/2018   CHOLHDL 2.6 08/29/2018    Significant Diagnostic Results in last 30 days:  No results found.  Assessment/Plan Benign hypertension with chronic kidney disease, stage III (HCC) Stable, continue Metoprolol.   Paroxysmal atrial fibrillation (HCC) Heart rate is in control, continue Metoprolol, Eliquis, last Cardiology 08/28/19-f/u 12 mos.   Controlled type 2 diabetes mellitus with stage 3 chronic kidney disease, without long-term current use of insulin (HCC) Stable, Hgb a1c 6.4 03/2019, continue Metformin, urine micro albumin done 05/16/19  Blood loss anemia Hgb 11.9 04/2019, continue Fe.   Neuropathic pain Stable, continue Gabapentin qhs.   Microalbuminuria due to type 2 diabetes mellitus ([place]) 05/19/19 urine micro albumin 69(30-299), monitor.      Family/ staff Communication: plan of care reviewed  with the patient and charge nurse.   Labs/tests ordered:  none  Time spend 25 minutes.

## 2019-09-15 ENCOUNTER — Encounter: Payer: Self-pay | Admitting: Nurse Practitioner

## 2019-09-15 DIAGNOSIS — E1129 Type 2 diabetes mellitus with other diabetic kidney complication: Secondary | ICD-10-CM | POA: Insufficient documentation

## 2019-09-15 NOTE — Assessment & Plan Note (Signed)
Stable, continue Gabapentin qhs.

## 2019-09-15 NOTE — Assessment & Plan Note (Signed)
Hgb 11.9 04/2019, continue Fe.

## 2019-09-15 NOTE — Assessment & Plan Note (Signed)
05/19/19 urine micro albumin 69(30-299), monitor.

## 2019-09-15 NOTE — Assessment & Plan Note (Signed)
Heart rate is in control, continue Metoprolol, Eliquis, last Cardiology 08/28/19-f/u 12 mos.

## 2019-09-15 NOTE — Assessment & Plan Note (Signed)
Stable, Hgb a1c 6.4 03/2019, continue Metformin, urine micro albumin done 05/16/19

## 2019-09-15 NOTE — Assessment & Plan Note (Signed)
Stable, continue Metoprolol.

## 2019-09-25 ENCOUNTER — Encounter: Payer: Self-pay | Admitting: Internal Medicine

## 2019-09-25 ENCOUNTER — Non-Acute Institutional Stay (SKILLED_NURSING_FACILITY): Payer: PPO | Admitting: Internal Medicine

## 2019-09-25 DIAGNOSIS — H1131 Conjunctival hemorrhage, right eye: Secondary | ICD-10-CM

## 2019-09-25 NOTE — Progress Notes (Signed)
Location:   [facility] Room Number: 35 Place of Service:  SNF [phone]) Provider: Virgie Dad, MD  Virgie Dad, MD  Patient Care Team: Virgie Dad, MD as PCP - General (Internal Medicine) Buford Dresser, MD as PCP - Cardiology (Cardiology) Ricard Dillon, MD as Consulting Physician (Internal Medicine)  Extended Emergency Contact Information Primary Emergency Contact: Gordon,[place] Address: [address]          [place], [place] [address] [name] of [place] Phone: [phone] Mobile Phone: [phone] Relation: Daughter  Code Status:  DNR Goals of care: Advanced Directive information Advanced Directives 09/25/2019  Does Patient Have a Medical Advance Directive? Yes  Type of Advance Directive Living will;Healthcare Power of Attorney  Does patient want to make changes to medical advance directive? No - Patient declined  Copy of [place] in Chart? Yes - validated most recent copy scanned in chart (See row information)  Pre-existing out of facility DNR order (yellow form or pink MOST form) -     Chief Complaint  Patient presents with  . Acute Visit    Eye redness    HPI:  Pt is a 86 y.o. male seen today for an acute visit for Eye redness  Patient has h/o Hypertension, Hyperlipidemia, Diabetes mellitus, h/o Brain stem Infarct s/p TPA In 10/19,Hyponatremia,Atrial Fibrillation on Eliquis since 10/19.,PADs/pBilateralAKA with first Left AKA and then Right AKA   Seen for Right Eye redness No Discharge. No Change in Vision No pAin no Itching Past Medical History:  Diagnosis Date  . BPH (benign prostatic hyperplasia) 10/14/2014  . Cervicalgia 01/04/2016  . Chronic kidney disease   . Contusion of left shoulder 05/27/14  . DM type 2 (diabetes mellitus, type 2) ([place])   . Dysphagia   . High blood pressure   . History of basal cell cancer 11/21/2005   Right temple  . History of colonic polyps 10/14/2003    . Hyperlipidemia   . Left knee pain 05/27/14  . Peripheral neuropathic pain   . Peripheral vascular disease ([place])   . SAH (subarachnoid hemorrhage) ([place]) 2009   TBI  . Stroke ([place])   . TIA (transient ischemic attack) 11/06/2005   Left eye pain. Slurred speech. Diaphoresis. No residual effects.    Past Surgical History:  Procedure Laterality Date  . ABDOMINAL AORTOGRAM W/LOWER EXTREMITY N/A 06/12/2018   Procedure: ABDOMINAL AORTOGRAM W/LOWER EXTREMITY;  Surgeon: Marty Heck, MD;  Location: [facility];  Service: Cardiovascular;  Laterality: N/A;  . Achilles tendon repair Left 1998   ruptured tendon  . AMPUTATION Left 10/11/2018   Procedure: Left  AMPUTATION ABOVE KNEE;  Surgeon: Marty Heck, MD;  Location: [facility];  Service: Vascular;  Laterality: Left;  . AMPUTATION Right 02/07/2019   Procedure: AMPUTATION ABOVE KNEE;  Surgeon: Rosetta Posner, MD;  Location: [facility];  Service: Vascular;  Laterality: Right;  . COLONOSCOPY  1992   Polyp removed  . COLONOSCOPY  1998   normal  . ORIF FEMUR FRACTURE Right 1929  . PERIPHERAL VASCULAR INTERVENTION Left 06/12/2018   Procedure: PERIPHERAL VASCULAR INTERVENTION;  Surgeon: Marty Heck, MD;  Location: [facility];  Service: Cardiovascular;  Laterality: Left;  Attempted   . TONSILLECTOMY      Allergies  Allergen Reactions  . Tape Other (See Comments)    TAPE WILL TEAR THE SKIN!!!!    Allergies as of 09/25/2019      Reactions  Tape Other (See Comments)   TAPE WILL TEAR THE SKIN!!!!      Medication List       Accurate as of September 25, 2019  4:23 PM. If you have any questions, ask your nurse or doctor.        Acetaminophen 8 Hour 650 MG CR tablet Generic drug: acetaminophen Take 650 mg by mouth every 8 (eight) hours as needed for pain.   bisacodyl 10 MG suppository Commonly known as: DULCOLAX Place 10 mg rectally daily as needed for moderate constipation.   Calcium Carbonate-Vitamin D 600-400  MG-UNIT tablet Take 1 tablet by mouth daily. Morning: 7am-11am.   D3-1000 25 MCG (1000 UT) tablet Generic drug: Cholecalciferol Take 1,000 Units by mouth daily. Morning 7am-11am.   Eliquis 2.5 MG Tabs tablet Generic drug: apixaban Take 2.5 mg by mouth 2 (two) times daily. Morning: 7am-11am Evening: 7:30pm-11pm   feeding supplement (PRO-STAT SUGAR FREE 64) Liqd Take 30 mLs by mouth daily. In the morning between the hours of 7am-11am   ferrous sulfate 325 (65 FE) MG tablet Take 325 mg by mouth. Once on Mon, Wed and Fri with meals at 1pm.   gabapentin 100 MG capsule Commonly known as: NEURONTIN Take 100 mg by mouth at bedtime. 7:30pm-11pm.   metFORMIN 500 MG tablet Commonly known as: GLUCOPHAGE Take 500 mg by mouth 2 (two) times daily with a meal. Morning: 7am-9am Evening: 5pm-7pm.   metoprolol tartrate 25 MG tablet Commonly known as: LOPRESSOR Take 25 mg by mouth 2 (two) times daily. Morning: 7am-11am Evening: 7pm-11pm   Propylene Glycol-Glycerin 1-0.3 % Soln Place 2 drops into both eyes daily as needed (dry eyes).   Propylene Glycol-Glycerin 1-0.3 % Soln Apply 2 drops to eye daily as needed.   sennosides-docusate sodium 8.6-50 MG tablet Commonly known as: SENOKOT-S Take 2 tablets by mouth at bedtime as needed.   simvastatin 10 MG tablet Commonly known as: ZOCOR Take 10 mg by mouth daily. Between the hours of 5pm-7pm.   zinc oxide 20 % ointment Apply 1 application topically as needed for irritation.       Review of Systems Review of Systems  Constitutional: Negative for activity change, appetite change, chills, diaphoresis, fatigue and fever.  HENT: Negative for mouth sores, postnasal drip, rhinorrhea, sinus pain and sore throat.   Respiratory: Negative for apnea, cough, chest tightness, shortness of breath and wheezing.   Cardiovascular: Negative for chest pain, palpitations and leg swelling.  Gastrointestinal: Negative for abdominal distention, abdominal  pain, constipation, diarrhea, nausea and vomiting.  Genitourinary: Negative for dysuria and frequency.  Musculoskeletal: Negative for arthralgias, joint swelling and myalgias.  Skin: Negative for rash.  Neurological: Negative for dizziness, syncope, weakness, light-headedness and numbness.  Psychiatric/Behavioral: Negative for behavioral problems, confusion and sleep disturbance.    Immunization History  Administered Date(s) Administered  . DTaP 12/31/2012  . Influenza, High Dose Seasonal PF 04/04/2017, 04/10/2019  . Influenza,inj,Quad PF,6+ Mos 03/28/2018  . Influenza-Unspecified 04/10/2014, 03/25/2015, 04/06/2016  . Moderna SARS-COVID-2 Vaccination 06/30/2019, 07/28/2019  . PPD Test 04/29/2014  . Pneumococcal Conjugate-13 04/28/2017  . Pneumococcal-Unspecified 03/13/2011  . Td 12/24/2012  . Tdap 04/28/2017  . Zoster Recombinat (Shingrix) 12/14/2017   Pertinent  Health Maintenance Due  Topic Date Due  . FOOT EXAM  06/27/2018  . URINE MICROALBUMIN  03/14/2019  . HEMOGLOBIN A1C  10/09/2019  . INFLUENZA VACCINE  01/25/2020  . OPHTHALMOLOGY EXAM  07/01/2020  . PNA vac Low Risk Adult  Completed   Fall Risk  10/02/2018  09/11/2018 08/28/2018 08/08/2018 08/07/2018  Falls in the past year? 0 0 0 0 0  Number falls in past yr: 0 0 0 - 0  Injury with Fall? 0 0 0 - 0  Risk for fall due to : - - - - -   Functional Status Survey:    Vitals:   09/25/19 1617  BP: (!) 115/56  Pulse: 66  Resp: 17  Temp: (!) 97.1 F (36.2 C)  SpO2: 97%  Weight: 118 lb 1.6 oz (53.6 kg)  Height: 3\' 8"  (1.118 m)   Body mass index is 42.89 kg/m. Physical Exam  Constitutional: Oriented to person, place, and time. Well-developed and well-nourished.  HENT:  Head: Normocephalic.  Mouth/Throat: Oropharynx is clear and moist.  Eyes: Pupils are equal, round, and reactive to light.  Subconjuctival Hemorrhage in Right EYE Neck: Neck supple.  Cardiovascular: Normal rate and normal heart sounds.  No murmur  heard. Pulmonary/Chest: Effort normal and breath sounds normal. No respiratory distress. No wheezes. She has no rales.  Abdominal: Soft. Bowel sounds are normal. No distension. There is no tenderness. There is no rebound.  Musculoskeletal: Bilateral AKA  Lymphadenopathy: none Neurological: Alert and oriented to person, place, and time.  Skin: Skin is warm and dry.  Psychiatric: Normal mood and affect. Behavior is normal. Thought content normal.    Labs reviewed: Recent Labs    02/08/19 0245 02/08/19 0245 02/09/19 0311 02/09/19 0311 02/10/19 0402 02/11/19 0000 02/25/19 0000 04/10/19 0000 05/19/19 0000  NA 129*   < > 129*   < > 128*   < > 129* 135* 137  K 4.7   < > 4.4   < > 4.5   < > 4.3 4.4 4.5  CL 94*   < > 98   < > 95*   < > 95* 99 100  CO2 22   < > 23   < > 25   < > 29* 30* 27*  GLUCOSE 242*  --  113*  --  176*  --   --   --   --   BUN 20   < > 27*   < > 21   < > 22* 27* 31*  CREATININE 1.22   < > 1.21   < > 0.98   < > 0.9 1.0 1.1  CALCIUM 8.5*   < > 8.2*   < > 8.1*   < > 8.9 8.9 9.4   < > = values in this interval not displayed.   Recent Labs    10/09/18 1352 10/09/18 1352 10/09/18 1920 10/09/18 1920 10/17/18 0000 10/17/18 0000 10/22/18 0000 04/10/19 0000 05/19/19 0000  AST 23   < > 22   < > 22   < > 16 15 15   ALT 22   < > 21   < > 14   < > 11 13 16   ALKPHOS 64   < > 68   < > 67   < > 69 64 68  BILITOT 0.8  --  0.7  --   --   --   --   --   --   PROT 6.3*  --  6.2*  --  4.6  --   --   --   --   ALBUMIN 2.5*   < > 2.5*  --  2.2  --   --  3.0* 3.3*   < > = values in this interval not displayed.   Recent Labs  02/07/19 1233 02/07/19 1233 02/08/19 0245 02/08/19 0245 02/09/19 0311 02/11/19 0000 02/25/19 0000 04/10/19 0000 05/19/19 0000  WBC 9.7   < > 14.3*   < > 14.5*   < > 7.5 6.8 6.5  NEUTROABS  --   --   --   --  10.9*  --  3,420 3,876 3,510  HGB 11.8*   < > 11.6*   < > 9.5*   < > 10.8* 11.5* 11.9*  HCT 34.4*   < > 33.4*   < > 27.5*   < > 32* 34*  35*  MCV 97.5  --  96.8  --  96.8  --   --   --   --   PLT 448*   < > 454*   < > 459*   < > 494* 403* 369   < > = values in this interval not displayed.   Lab Results  Component Value Date   TSH 1.69 10/24/2018   Lab Results  Component Value Date   HGBA1C 6.4 04/10/2019   Lab Results  Component Value Date   CHOL 126 08/29/2018   HDL 49 08/29/2018   LDLCALC 59 08/29/2018   TRIG 95 08/29/2018   CHOLHDL 2.6 08/29/2018    Significant Diagnostic Results in last 30 days:  No results found.  Assessment/Plan  Subconjunctival hemorrhage of right eye Continue To Follow Does not want any Eye Drops   Family/ staff Communication:   Labs/tests ordered:

## 2019-10-15 DIAGNOSIS — M79605 Pain in left leg: Secondary | ICD-10-CM | POA: Diagnosis not present

## 2019-10-15 DIAGNOSIS — Z89612 Acquired absence of left leg above knee: Secondary | ICD-10-CM | POA: Diagnosis not present

## 2019-10-15 DIAGNOSIS — D500 Iron deficiency anemia secondary to blood loss (chronic): Secondary | ICD-10-CM | POA: Diagnosis not present

## 2019-10-15 DIAGNOSIS — Z8673 Personal history of transient ischemic attack (TIA), and cerebral infarction without residual deficits: Secondary | ICD-10-CM | POA: Diagnosis not present

## 2019-10-15 DIAGNOSIS — R2681 Unsteadiness on feet: Secondary | ICD-10-CM | POA: Diagnosis not present

## 2019-10-15 DIAGNOSIS — N189 Chronic kidney disease, unspecified: Secondary | ICD-10-CM | POA: Diagnosis not present

## 2019-10-15 DIAGNOSIS — S91101S Unspecified open wound of right great toe without damage to nail, sequela: Secondary | ICD-10-CM | POA: Diagnosis not present

## 2019-10-15 DIAGNOSIS — M792 Neuralgia and neuritis, unspecified: Secondary | ICD-10-CM | POA: Diagnosis not present

## 2019-10-15 DIAGNOSIS — H04129 Dry eye syndrome of unspecified lacrimal gland: Secondary | ICD-10-CM | POA: Diagnosis not present

## 2019-10-15 DIAGNOSIS — I739 Peripheral vascular disease, unspecified: Secondary | ICD-10-CM | POA: Diagnosis not present

## 2019-10-15 DIAGNOSIS — Z85828 Personal history of other malignant neoplasm of skin: Secondary | ICD-10-CM | POA: Diagnosis not present

## 2019-10-15 DIAGNOSIS — M6281 Muscle weakness (generalized): Secondary | ICD-10-CM | POA: Diagnosis not present

## 2019-10-15 DIAGNOSIS — E785 Hyperlipidemia, unspecified: Secondary | ICD-10-CM | POA: Diagnosis not present

## 2019-10-15 DIAGNOSIS — E1151 Type 2 diabetes mellitus with diabetic peripheral angiopathy without gangrene: Secondary | ICD-10-CM | POA: Diagnosis not present

## 2019-10-15 DIAGNOSIS — I998 Other disorder of circulatory system: Secondary | ICD-10-CM | POA: Diagnosis not present

## 2019-10-15 DIAGNOSIS — Z4781 Encounter for orthopedic aftercare following surgical amputation: Secondary | ICD-10-CM | POA: Diagnosis not present

## 2019-10-15 DIAGNOSIS — R634 Abnormal weight loss: Secondary | ICD-10-CM | POA: Diagnosis not present

## 2019-10-15 DIAGNOSIS — I129 Hypertensive chronic kidney disease with stage 1 through stage 4 chronic kidney disease, or unspecified chronic kidney disease: Secondary | ICD-10-CM | POA: Diagnosis not present

## 2019-10-27 ENCOUNTER — Encounter: Payer: Self-pay | Admitting: Internal Medicine

## 2019-10-27 ENCOUNTER — Non-Acute Institutional Stay (SKILLED_NURSING_FACILITY): Payer: PPO | Admitting: Internal Medicine

## 2019-10-27 DIAGNOSIS — I480 Paroxysmal atrial fibrillation: Secondary | ICD-10-CM

## 2019-10-27 DIAGNOSIS — I129 Hypertensive chronic kidney disease with stage 1 through stage 4 chronic kidney disease, or unspecified chronic kidney disease: Secondary | ICD-10-CM

## 2019-10-27 DIAGNOSIS — E1122 Type 2 diabetes mellitus with diabetic chronic kidney disease: Secondary | ICD-10-CM | POA: Diagnosis not present

## 2019-10-27 DIAGNOSIS — Z89612 Acquired absence of left leg above knee: Secondary | ICD-10-CM

## 2019-10-27 DIAGNOSIS — N183 Chronic kidney disease, stage 3 unspecified: Secondary | ICD-10-CM

## 2019-10-27 DIAGNOSIS — Z89611 Acquired absence of right leg above knee: Secondary | ICD-10-CM | POA: Diagnosis not present

## 2019-10-27 NOTE — Progress Notes (Signed)
Location:  [facility] Room Number: 21/A Place of Service:  SNF [phone])  Provider: Veleta Miners MD   Code Status: DNR Goals of Care:  Advanced Directives 10/27/2019  Does Patient Have a Medical Advance Directive? Yes  Type of Paramedic of [place];Out of facility DNR (pink MOST or yellow form);Living will  Does patient want to make changes to medical advance directive? No - Patient declined  Copy of [place] in Chart? Yes - validated most recent copy scanned in chart (See row information)  Pre-existing out of facility DNR order (yellow form or pink MOST form) -     Chief Complaint  Patient presents with  . Medical Management of Chronic Issues    Routine visit of medical management    HPI: Patient is a 86 y.o. male seen today for medical management of chronic diseases.    Patient has h/o Hypertension, Hyperlipidemia, Diabetes mellitus, h/o Brain stem Infarct s/p TPA In 10/19,Hyponatremia,Atrial Fibrillation on Eliquis since 10/19.,PADs/pBilateralAKA with first Left AKA and then Right AKA   Doing well. Appetite is good. No Complains Uses Electric Wheelchair to move around. Has gained some weight. No Nursing issues Past Medical History:  Diagnosis Date  . BPH (benign prostatic hyperplasia) 10/14/2014  . Cervicalgia 01/04/2016  . Chronic kidney disease   . Contusion of left shoulder 05/27/14  . DM type 2 (diabetes mellitus, type 2) ([facility])   . Dysphagia   . High blood pressure   . History of basal cell cancer 11/21/2005   Right temple  . History of colonic polyps 10/14/2003  . Hyperlipidemia   . Left knee pain 05/27/14  . Peripheral neuropathic pain   . Peripheral vascular disease ([place])   . SAH (subarachnoid hemorrhage) ([facility] [place] [facility]) 2009   TBI  . Stroke ([place])   . TIA (transient ischemic attack) 11/06/2005   Left eye pain. Slurred speech. Diaphoresis. No residual effects.     Past Surgical History:  Procedure  Laterality Date  . ABDOMINAL AORTOGRAM W/LOWER EXTREMITY N/A 06/12/2018   Procedure: ABDOMINAL AORTOGRAM W/LOWER EXTREMITY;  Surgeon: Marty Heck, MD;  Location: [facility];  Service: Cardiovascular;  Laterality: N/A;  . Achilles tendon repair Left 1998   ruptured tendon  . AMPUTATION Left 10/11/2018   Procedure: Left  AMPUTATION ABOVE KNEE;  Surgeon: Marty Heck, MD;  Location: [facility];  Service: Vascular;  Laterality: Left;  . AMPUTATION Right 02/07/2019   Procedure: AMPUTATION ABOVE KNEE;  Surgeon: Rosetta Posner, MD;  Location: [facility];  Service: Vascular;  Laterality: Right;  . COLONOSCOPY  1992   Polyp removed  . COLONOSCOPY  1998   normal  . ORIF FEMUR FRACTURE Right 1929  . PERIPHERAL VASCULAR INTERVENTION Left 06/12/2018   Procedure: PERIPHERAL VASCULAR INTERVENTION;  Surgeon: Marty Heck, MD;  Location: [facility];  Service: Cardiovascular;  Laterality: Left;  Attempted   . TONSILLECTOMY      Allergies  Allergen Reactions  . Tape Other (See Comments)    TAPE WILL TEAR THE SKIN!!!!    Outpatient Encounter Medications as of 10/27/2019  Medication Sig  . acetaminophen (ACETAMINOPHEN 8 HOUR) 650 MG CR tablet Take 650 mg by mouth every 8 (eight) hours as needed for pain.  Marland Kitchen apixaban (ELIQUIS) 2.5 MG TABS tablet Take 2.5 mg by mouth 2 (two) times daily. Morning: 7am-11am Evening: 7:30pm-11pm  . bisacodyl (DULCOLAX) 10 MG suppository Place 10 mg rectally daily as needed for  moderate constipation.  . Calcium Carbonate-Vitamin D 600-400 MG-UNIT tablet Take 1 tablet by mouth daily. Morning: 7am-11am.  . Cholecalciferol (D3-1000) 25 MCG (1000 UT) tablet Take 1,000 Units by mouth daily. Morning 7am-11am.  . ferrous sulfate 325 (65 FE) MG tablet Take 325 mg by mouth. Once on Mon, Wed and Fri with meals at 1pm.  . gabapentin (NEURONTIN) 100 MG capsule Take 100 mg by mouth at bedtime. 7:30pm-11pm.  . metFORMIN (GLUCOPHAGE) 500 MG tablet Take 500 mg by  mouth 2 (two) times daily with a meal. Morning: 7am-9am Evening: 5pm-7pm.  . metoprolol tartrate (LOPRESSOR) 25 MG tablet Take 25 mg by mouth 2 (two) times daily. Morning: 7am-11am Evening: 7pm-11pm  . Propylene Glycol-Glycerin 1-0.3 % SOLN Place 2 drops into both eyes daily as needed (dry eyes).   . sennosides-docusate sodium (SENOKOT-S) 8.6-50 MG tablet Take 2 tablets by mouth at bedtime as needed.   . simvastatin (ZOCOR) 10 MG tablet Take 10 mg by mouth daily. Between the hours of 5pm-7pm.  . zinc oxide 20 % ointment Apply 1 application topically as needed for irritation.  . [DISCONTINUED] Amino Acids-Protein Hydrolys (FEEDING SUPPLEMENT, PRO-STAT SUGAR FREE 64,) LIQD Take 30 mLs by mouth daily. In the morning between the hours of 7am-11am  . [DISCONTINUED] Propylene Glycol-Glycerin 1-0.3 % SOLN Apply 2 drops to eye daily as needed.   No facility-administered encounter medications on file as of 10/27/2019.    Review of Systems:  Review of Systems  Review of Systems  Constitutional: Negative for activity change, appetite change, chills, diaphoresis, fatigue and fever.  HENT: Negative for mouth sores, postnasal drip, rhinorrhea, sinus pain and sore throat.   Respiratory: Negative for apnea, cough, chest tightness, shortness of breath and wheezing.   Cardiovascular: Negative for chest pain, palpitations and leg swelling.  Gastrointestinal: Negative for abdominal distention, abdominal pain, constipation, diarrhea, nausea and vomiting.  Genitourinary: Negative for dysuria and frequency.  Musculoskeletal: Negative for arthralgias, joint swelling and myalgias.  Skin: Negative for rash.  Neurological: Negative for dizziness, syncope, weakness, light-headedness and numbness.  Psychiatric/Behavioral: Negative for behavioral problems, confusion and sleep disturbance.     Health Maintenance  Topic Date Due  . FOOT EXAM  06/27/2018  . URINE MICROALBUMIN  03/14/2019  . HEMOGLOBIN A1C  10/09/2019   . INFLUENZA VACCINE  01/25/2020  . OPHTHALMOLOGY EXAM  07/01/2020  . TETANUS/TDAP  04/29/2027  . COVID-19 Vaccine  Completed  . PNA vac Low Risk Adult  Completed    Physical Exam: Vitals:   10/27/19 1553  BP: 109/60  Pulse: 73  Resp: 16  Temp: 97.7 F (36.5 C)  SpO2: 92%  Weight: 119 lb 9.6 oz (54.3 kg)  Height: 3\' 8"  (1.118 m)   Body mass index is 43.43 kg/m. Physical Exam  Constitutional: Oriented to person, place, and time. Well-developed and well-nourished.  HENT:  Head: Normocephalic.  Mouth/Throat: Oropharynx is clear and moist.  Eyes: Pupils are equal, round, and reactive to light.  Neck: Neck supple.  Cardiovascular: Normal rate and normal heart sounds.  Positive for [place] Pulmonary/Chest: Effort normal and breath sounds normal. No respiratory distress. No wheezes. She has no rales.  Abdominal: Soft. Bowel sounds are normal. No distension. There is no tenderness. There is no rebound.  Musculoskeletal: S/P AKA Healed Lymphadenopathy: none Neurological: Alert and oriented to person, place, and time.  Skin: Skin is warm and dry.  Psychiatric: Normal mood and affect. Behavior is normal. Thought content normal.    Labs reviewed: Basic Metabolic  Panel: Recent Labs    02/08/19 0245 02/08/19 0245 02/09/19 0311 02/09/19 0311 02/10/19 0402 02/11/19 0000 02/25/19 0000 04/10/19 0000 05/19/19 0000  NA 129*   < > 129*   < > 128*   < > 129* 135* 137  K 4.7   < > 4.4   < > 4.5   < > 4.3 4.4 4.5  CL 94*   < > 98   < > 95*   < > 95* 99 100  CO2 22   < > 23   < > 25   < > 29* 30* 27*  GLUCOSE 242*  --  113*  --  176*  --   --   --   --   BUN 20   < > 27*   < > 21   < > 22* 27* 31*  CREATININE 1.22   < > 1.21   < > 0.98   < > 0.9 1.0 1.1  CALCIUM 8.5*   < > 8.2*   < > 8.1*   < > 8.9 8.9 9.4   < > = values in this interval not displayed.   Liver Function Tests: Recent Labs    04/10/19 0000 05/19/19 0000  AST 15 15  ALT 13 16  ALKPHOS 64 68  ALBUMIN 3.0* 3.3*    No results for input(s): LIPASE, AMYLASE in the last 8760 hours. No results for input(s): AMMONIA in the last 8760 hours. CBC: Recent Labs    02/07/19 1233 02/07/19 1233 02/08/19 0245 02/08/19 0245 02/09/19 0311 02/11/19 0000 02/25/19 0000 04/10/19 0000 05/19/19 0000  WBC 9.7   < > 14.3*   < > 14.5*   < > 7.5 6.8 6.5  NEUTROABS  --   --   --   --  10.9*  --  3,420 3,876 3,510  HGB 11.8*   < > 11.6*   < > 9.5*   < > 10.8* 11.5* 11.9*  HCT 34.4*   < > 33.4*   < > 27.5*   < > 32* 34* 35*  MCV 97.5  --  96.8  --  96.8  --   --   --   --   PLT 448*   < > 454*   < > 459*   < > 494* 403* 369   < > = values in this interval not displayed.   Lipid Panel: No results for input(s): CHOL, HDL, LDLCALC, TRIG, CHOLHDL, LDLDIRECT in the last 8760 hours. Lab Results  Component Value Date   HGBA1C 6.4 04/10/2019    Procedures since last visit: No results found.  Assessment/Plan  S/P AKA (above knee amputation) bilateral ([place]) Doing well. Independent with his transfers Some Phantom Pains  Paroxysmal atrial fibrillation (HCC) On Eliquis and Metoprolol  Controlled type 2 diabetes mellitus with stage 3 chronic kidney disease, without long-term current use of insulin (HCC) On Metformin Repeat A1C  Stage 3b chronic kidney disease Bun and Creat stable Repeat BMP Neuropathic pain On Low dose of Neurontin  Hyponatremia Soidum is stable  Anemia Hgb is 11.9 On Iron H/o CVA Continue Eliquis and statin Hyperlipidemia On Zocor   Labs/tests ordered:  CBC,CMP,TSJH,Lipid Panel, A1 C, B12 level Next appt:  Visit date not found  Total time spent in this patient care encounter was  25_  minutes; greater than 50% of the visit spent counseling patient and staff, reviewing records , Labs and coordinating care for problems addressed at this encounter.

## 2019-10-27 NOTE — Progress Notes (Signed)
Location:    [facility] Room Number: 21/A Place of Service:  SNF [phone]) Provider:  Veleta Miners MD  Virgie Dad, MD  Patient Care Team: Virgie Dad, MD as PCP - General (Internal Medicine) Buford Dresser, MD as PCP - Cardiology (Cardiology) Ricard Dillon, MD as Consulting Physician (Internal Medicine)  Extended Emergency Contact Information Primary Emergency Contact: Gordon,[place] Address: [address]          [place], [place] [address] [name] of [place] Phone: [phone] Mobile Phone: [phone] Relation: Daughter  Code Status:  DNR Goals of care: Advanced Directive information Advanced Directives 10/27/2019  Does Patient Have a Medical Advance Directive? Yes  Type of Paramedic of [place];Living will;Out of facility DNR (pink MOST or yellow form)  Does patient want to make changes to medical advance directive? No - Patient declined  Copy of [place] in Chart? Yes - validated most recent copy scanned in chart (See row information)  Pre-existing out of facility DNR order (yellow form or pink MOST form) -     Chief Complaint  Patient presents with  . Medical Management of Chronic Issues    Routine visit of medical management    HPI:  Pt is a 86 y.o. male seen today for medical management of chronic diseases.     Past Medical History:  Diagnosis Date  . BPH (benign prostatic hyperplasia) 10/14/2014  . Cervicalgia 01/04/2016  . Chronic kidney disease   . Contusion of left shoulder 05/27/14  . DM type 2 (diabetes mellitus, type 2) ([facility])   . Dysphagia   . High blood pressure   . History of basal cell cancer 11/21/2005   Right temple  . History of colonic polyps 10/14/2003  . Hyperlipidemia   . Left knee pain 05/27/14  . Peripheral neuropathic pain   . Peripheral vascular disease ([facility] [facility])   . SAH (subarachnoid hemorrhage) ([facility]) 2009   TBI  . Stroke ([facility])   . TIA  (transient ischemic attack) 11/06/2005   Left eye pain. Slurred speech. Diaphoresis. No residual effects.    Past Surgical History:  Procedure Laterality Date  . ABDOMINAL AORTOGRAM W/LOWER EXTREMITY N/A 06/12/2018   Procedure: ABDOMINAL AORTOGRAM W/LOWER EXTREMITY;  Surgeon: Marty Heck, MD;  Location: [facility];  Service: Cardiovascular;  Laterality: N/A;  . Achilles tendon repair Left 1998   ruptured tendon  . AMPUTATION Left 10/11/2018   Procedure: Left  AMPUTATION ABOVE KNEE;  Surgeon: Marty Heck, MD;  Location: [facility];  Service: Vascular;  Laterality: Left;  . AMPUTATION Right 02/07/2019   Procedure: AMPUTATION ABOVE KNEE;  Surgeon: Rosetta Posner, MD;  Location: [facility];  Service: Vascular;  Laterality: Right;  . COLONOSCOPY  1992   Polyp removed  . COLONOSCOPY  1998   normal  . ORIF FEMUR FRACTURE Right 1929  . PERIPHERAL VASCULAR INTERVENTION Left 06/12/2018   Procedure: PERIPHERAL VASCULAR INTERVENTION;  Surgeon: Marty Heck, MD;  Location: [facility];  Service: Cardiovascular;  Laterality: Left;  Attempted   . TONSILLECTOMY      Allergies  Allergen Reactions  . Tape Other (See Comments)    TAPE WILL TEAR THE SKIN!!!!    Allergies as of 10/27/2019      Reactions   Tape Other (See Comments)   TAPE WILL TEAR THE SKIN!!!!      Medication List       Accurate as of Oct 27, 2019  2:58 PM. If you have any questions, ask your nurse or doctor.        STOP taking these medications   feeding supplement (PRO-STAT SUGAR FREE 64) Liqd Stopped by: Virgie Dad, MD     TAKE these medications   Acetaminophen 8 Hour 650 MG CR tablet Generic drug: acetaminophen Take 650 mg by mouth every 8 (eight) hours as needed for pain.   bisacodyl 10 MG suppository Commonly known as: DULCOLAX Place 10 mg rectally daily as needed for moderate constipation.   Calcium Carbonate-Vitamin D 600-400 MG-UNIT tablet Take 1 tablet by mouth daily. Morning:  7am-11am.   D3-1000 25 MCG (1000 UT) tablet Generic drug: Cholecalciferol Take 1,000 Units by mouth daily. Morning 7am-11am.   Eliquis 2.5 MG Tabs tablet Generic drug: apixaban Take 2.5 mg by mouth 2 (two) times daily. Morning: 7am-11am Evening: 7:30pm-11pm   ferrous sulfate 325 (65 FE) MG tablet Take 325 mg by mouth. Once on Mon, Wed and Fri with meals at 1pm.   gabapentin 100 MG capsule Commonly known as: NEURONTIN Take 100 mg by mouth at bedtime. 7:30pm-11pm.   metFORMIN 500 MG tablet Commonly known as: GLUCOPHAGE Take 500 mg by mouth 2 (two) times daily with a meal. Morning: 7am-9am Evening: 5pm-7pm.   metoprolol tartrate 25 MG tablet Commonly known as: LOPRESSOR Take 25 mg by mouth 2 (two) times daily. Morning: 7am-11am Evening: 7pm-11pm   Propylene Glycol-Glycerin 1-0.3 % Soln Place 2 drops into both eyes daily as needed (dry eyes).   sennosides-docusate sodium 8.6-50 MG tablet Commonly known as: SENOKOT-S Take 2 tablets by mouth at bedtime as needed.   simvastatin 10 MG tablet Commonly known as: ZOCOR Take 10 mg by mouth daily. Between the hours of 5pm-7pm.   zinc oxide 20 % ointment Apply 1 application topically as needed for irritation.       Review of Systems  Immunization History  Administered Date(s) Administered  . DTaP 12/31/2012  . Influenza, High Dose Seasonal PF 04/04/2017, 04/10/2019  . Influenza,inj,Quad PF,6+ Mos 03/28/2018  . Influenza-Unspecified 04/10/2014, 03/25/2015, 04/06/2016  . Moderna SARS-COVID-2 Vaccination 06/30/2019, 07/28/2019  . PPD Test 04/29/2014  . Pneumococcal Conjugate-13 04/28/2017  . Pneumococcal-Unspecified 03/13/2011  . Td 12/24/2012  . Tdap 04/28/2017  . Zoster Recombinat (Shingrix) 12/14/2017   Pertinent  Health Maintenance Due  Topic Date Due  . FOOT EXAM  06/27/2018  . URINE MICROALBUMIN  03/14/2019  . HEMOGLOBIN A1C  10/09/2019  . INFLUENZA VACCINE  01/25/2020  . OPHTHALMOLOGY EXAM  07/01/2020  . PNA  vac Low Risk Adult  Completed   Fall Risk  10/02/2018 09/11/2018 08/28/2018 08/08/2018 08/07/2018  Falls in the past year? 0 0 0 0 0  Number falls in past yr: 0 0 0 - 0  Injury with Fall? 0 0 0 - 0  Risk for fall due to : - - - - -   Functional Status Survey:    There were no vitals filed for this visit. There is no height or weight on file to calculate BMI. Physical Exam  Labs reviewed: Recent Labs    02/08/19 0245 02/08/19 0245 02/09/19 0311 02/09/19 0311 02/10/19 0402 02/11/19 0000 02/25/19 0000 04/10/19 0000 05/19/19 0000  NA 129*   < > 129*   < > 128*   < > 129* 135* 137  K 4.7   < > 4.4   < > 4.5   < > 4.3 4.4 4.5  CL 94*   < >  98   < > 95*   < > 95* 99 100  CO2 22   < > 23   < > 25   < > 29* 30* 27*  GLUCOSE 242*  --  113*  --  176*  --   --   --   --   BUN 20   < > 27*   < > 21   < > 22* 27* 31*  CREATININE 1.22   < > 1.21   < > 0.98   < > 0.9 1.0 1.1  CALCIUM 8.5*   < > 8.2*   < > 8.1*   < > 8.9 8.9 9.4   < > = values in this interval not displayed.   Recent Labs    04/10/19 0000 05/19/19 0000  AST 15 15  ALT 13 16  ALKPHOS 64 68  ALBUMIN 3.0* 3.3*   Recent Labs    02/07/19 1233 02/07/19 1233 02/08/19 0245 02/08/19 0245 02/09/19 0311 02/11/19 0000 02/25/19 0000 04/10/19 0000 05/19/19 0000  WBC 9.7   < > 14.3*   < > 14.5*   < > 7.5 6.8 6.5  NEUTROABS  --   --   --   --  10.9*  --  3,420 3,876 3,510  HGB 11.8*   < > 11.6*   < > 9.5*   < > 10.8* 11.5* 11.9*  HCT 34.4*   < > 33.4*   < > 27.5*   < > 32* 34* 35*  MCV 97.5  --  96.8  --  96.8  --   --   --   --   PLT 448*   < > 454*   < > 459*   < > 494* 403* 369   < > = values in this interval not displayed.   Lab Results  Component Value Date   TSH 1.69 10/24/2018   Lab Results  Component Value Date   HGBA1C 6.4 04/10/2019   Lab Results  Component Value Date   CHOL 126 08/29/2018   HDL 49 08/29/2018   LDLCALC 59 08/29/2018   TRIG 95 08/29/2018   CHOLHDL 2.6 08/29/2018    Significant  Diagnostic Results in last 30 days:  No results found.  Assessment/Plan There are no diagnoses linked to this encounter.   Family/ staff Communication:   Labs/tests ordered:      This encounter was created in error - please disregard.

## 2019-10-30 DIAGNOSIS — D519 Vitamin B12 deficiency anemia, unspecified: Secondary | ICD-10-CM | POA: Diagnosis not present

## 2019-10-30 DIAGNOSIS — F0390 Unspecified dementia without behavioral disturbance: Secondary | ICD-10-CM | POA: Diagnosis not present

## 2019-10-30 DIAGNOSIS — E785 Hyperlipidemia, unspecified: Secondary | ICD-10-CM | POA: Diagnosis not present

## 2019-10-30 DIAGNOSIS — E039 Hypothyroidism, unspecified: Secondary | ICD-10-CM | POA: Diagnosis not present

## 2019-10-30 DIAGNOSIS — Z131 Encounter for screening for diabetes mellitus: Secondary | ICD-10-CM | POA: Diagnosis not present

## 2019-10-31 LAB — BASIC METABOLIC PANEL
BUN: 21 (ref 4–21)
CO2: 29 — AB (ref 13–22)
Chloride: 102 (ref 99–108)
Creatinine: 1.2 (ref 0.6–1.3)
Glucose: 153
Potassium: 4.5 (ref 3.4–5.3)
Sodium: 138 (ref 137–147)

## 2019-10-31 LAB — LIPID PANEL
Cholesterol: 132 (ref 0–200)
HDL: 42 (ref 35–70)
LDL Cholesterol: 73
Triglycerides: 88 (ref 40–160)

## 2019-10-31 LAB — COMPREHENSIVE METABOLIC PANEL
Albumin: 3.4 — AB (ref 3.5–5.0)
Calcium: 9.1 (ref 8.7–10.7)
Calcium: 9.1 (ref 8.7–10.7)
GFR calc Af Amer: 62
GFR calc non Af Amer: 54
Globulin: 2.6
Globulin: 2.6

## 2019-10-31 LAB — HEPATIC FUNCTION PANEL
ALT: 11 (ref 10–40)
AST: 14 (ref 14–40)
Alkaline Phosphatase: 62 (ref 25–125)

## 2019-10-31 LAB — HEMOGLOBIN A1C: Hemoglobin A1C: 7.7

## 2019-10-31 LAB — VITAMIN B12: Vitamin B-12: 214

## 2019-10-31 LAB — CBC AND DIFFERENTIAL
HCT: 39 — AB (ref 41–53)
Hemoglobin: 13.1 — AB (ref 13.5–17.5)
Platelets: 377 (ref 150–399)
WBC: 7.8

## 2019-10-31 LAB — TSH: TSH: 1.72 (ref 0.41–5.90)

## 2019-10-31 LAB — CBC: RBC: 3.88 (ref 3.87–5.11)

## 2019-11-21 ENCOUNTER — Non-Acute Institutional Stay (SKILLED_NURSING_FACILITY): Payer: PPO | Admitting: Nurse Practitioner

## 2019-11-21 DIAGNOSIS — K5900 Constipation, unspecified: Secondary | ICD-10-CM | POA: Diagnosis not present

## 2019-11-21 DIAGNOSIS — I480 Paroxysmal atrial fibrillation: Secondary | ICD-10-CM | POA: Diagnosis not present

## 2019-11-21 DIAGNOSIS — E1122 Type 2 diabetes mellitus with diabetic chronic kidney disease: Secondary | ICD-10-CM | POA: Diagnosis not present

## 2019-11-21 DIAGNOSIS — D500 Iron deficiency anemia secondary to blood loss (chronic): Secondary | ICD-10-CM

## 2019-11-21 DIAGNOSIS — N183 Chronic kidney disease, stage 3 unspecified: Secondary | ICD-10-CM

## 2019-11-21 DIAGNOSIS — M792 Neuralgia and neuritis, unspecified: Secondary | ICD-10-CM

## 2019-11-21 NOTE — Progress Notes (Signed)
Location:   [facility] Room Number: 21 Place of Service:  SNF (31) Provider:  Marlana Latus, NP  Virgie Dad, MD  Patient Care Team: Virgie Dad, MD as PCP - General (Internal Medicine) Buford Dresser, MD as PCP - Cardiology (Cardiology) Ricard Dillon, MD as Consulting Physician (Internal Medicine)  Extended Emergency Contact Information Primary Emergency Contact: Gordon,[place] Address: [address]          [place], [place] [address] [name] of [place] Phone: [phone] Mobile Phone: [phone] Relation: Daughter  Code Status:  DNR Goals of care: Advanced Directive information Advanced Directives 11/21/2019  Does Patient Have a Medical Advance Directive? Yes  Type of Paramedic of [place];Living will;Out of facility DNR (pink MOST or yellow form)  Does patient want to make changes to medical advance directive? No - Patient declined  Copy of [place] in Chart? Yes - validated most recent copy scanned in chart (See row information)  Pre-existing out of facility DNR order (yellow form or pink MOST form) -     Chief Complaint  Patient presents with  . Medical Management of Chronic Issues    Routine visit    HPI:  Pt is a 86 y.o. male seen today for medical management of chronic diseases.    The patient has history of Afib, heart rate is in control, on Metoprolol 25mg  bid, Eliquis 2.5mg  bid. Anemia, stable, on Fe, Vit B12. Constipation, stable, on Senokot S II prn, prn Bisacodyl 10mg  suppository. Peripheral neuropathy, phantom pain, stable, on Gabapentin 100mg  qd. T2 DM, blood sugar is controlled, on Metformin 500mg  bid.    Past Medical History:  Diagnosis Date  . BPH (benign prostatic hyperplasia) 10/14/2014  . Cervicalgia 01/04/2016  . Chronic kidney disease   . Contusion of left shoulder 05/27/14  . DM type 2 (diabetes mellitus, type 2) ([facility])   . Dysphagia   . High blood  pressure   . History of basal cell cancer 11/21/2005   Right temple  . History of colonic polyps 10/14/2003  . Hyperlipidemia   . Left knee pain 05/27/14  . Peripheral neuropathic pain   . Peripheral vascular disease ([facility])   . SAH (subarachnoid hemorrhage) ([facility]) 2009   TBI  . Stroke ([facility])   . TIA (transient ischemic attack) 11/06/2005   Left eye pain. Slurred speech. Diaphoresis. No residual effects.    Past Surgical History:  Procedure Laterality Date  . ABDOMINAL AORTOGRAM W/LOWER EXTREMITY N/A 06/12/2018   Procedure: ABDOMINAL AORTOGRAM W/LOWER EXTREMITY;  Surgeon: Marty Heck, MD;  Location: [facility];  Service: Cardiovascular;  Laterality: N/A;  . Achilles tendon repair Left 1998   ruptured tendon  . AMPUTATION Left 10/11/2018   Procedure: Left  AMPUTATION ABOVE KNEE;  Surgeon: Marty Heck, MD;  Location: [facility];  Service: Vascular;  Laterality: Left;  . AMPUTATION Right 02/07/2019   Procedure: AMPUTATION ABOVE KNEE;  Surgeon: Rosetta Posner, MD;  Location: [facility];  Service: Vascular;  Laterality: Right;  . COLONOSCOPY  1992   Polyp removed  . COLONOSCOPY  1998   normal  . ORIF FEMUR FRACTURE Right 1929  . PERIPHERAL VASCULAR INTERVENTION Left 06/12/2018   Procedure: PERIPHERAL VASCULAR INTERVENTION;  Surgeon: Marty Heck, MD;  Location: [facility];  Service: Cardiovascular;  Laterality: Left;  Attempted   . TONSILLECTOMY      Allergies  Allergen Reactions  . Tape Other (See Comments)  TAPE WILL TEAR THE SKIN!!!!    Allergies as of 11/21/2019      Reactions   Tape Other (See Comments)   TAPE WILL TEAR THE SKIN!!!!      Medication List       Accurate as of Nov 21, 2019 11:59 PM. If you have any questions, ask your nurse or doctor.        Acetaminophen 8 Hour 650 MG CR tablet Generic drug: acetaminophen Take 650 mg by mouth every 8 (eight) hours as needed for pain.   bisacodyl 10 MG suppository Commonly known as:  DULCOLAX Place 10 mg rectally daily as needed for moderate constipation.   Calcium Carbonate-Vitamin D 600-400 MG-UNIT tablet Take 1 tablet by mouth daily. Morning: 7am-11am.   D3-1000 25 MCG (1000 UT) tablet Generic drug: Cholecalciferol Take 1,000 Units by mouth daily. Morning 7am-11am.   Eliquis 2.5 MG Tabs tablet Generic drug: apixaban Take 2.5 mg by mouth 2 (two) times daily. Morning: 7am-11am Evening: 7:30pm-11pm   ferrous sulfate 325 (65 FE) MG tablet Take 325 mg by mouth. Once on Mon, Wed and Fri with meals at 1pm.   gabapentin 100 MG capsule Commonly known as: NEURONTIN Take 100 mg by mouth at bedtime. 7:30pm-11pm.   metFORMIN 500 MG tablet Commonly known as: GLUCOPHAGE Take 500 mg by mouth 2 (two) times daily with a meal. Morning: 7am-9am Evening: 5pm-7pm.   metoprolol tartrate 25 MG tablet Commonly known as: LOPRESSOR Take 25 mg by mouth 2 (two) times daily. Morning: 7am-11am Evening: 7pm-11pm   Propylene Glycol-Glycerin 1-0.3 % Soln Place 2 drops into both eyes daily as needed (dry eyes).   sennosides-docusate sodium 8.6-50 MG tablet Commonly known as: SENOKOT-S Take 2 tablets by mouth at bedtime as needed.   simvastatin 10 MG tablet Commonly known as: ZOCOR Take 10 mg by mouth daily. Between the hours of 5pm-7pm.   vitamin B-12 500 MCG tablet Commonly known as: CYANOCOBALAMIN Take 500 mcg by mouth daily.   zinc oxide 20 % ointment Apply 1 application topically as needed for irritation. To buttocks after every incontinent episode and as needed for redness. May keep at bedside. As Needed       Review of Systems  Constitutional: Negative for activity change, fever and unexpected weight change.  HENT: Positive for hearing loss and trouble swallowing. Negative for congestion and voice change.   Eyes: Negative for visual disturbance.  Respiratory: Positive for cough. Negative for shortness of breath and wheezing.   Cardiovascular: Negative for chest  pain, palpitations and leg swelling.  Gastrointestinal: Negative for abdominal pain and constipation.  Genitourinary: Negative for difficulty urinating, dysuria and urgency.  Musculoskeletal: Positive for gait problem.  Skin: Negative for color change.  Neurological: Negative for dizziness, speech difficulty and weakness.  Psychiatric/Behavioral: Negative for behavioral problems and sleep disturbance. The patient is not nervous/anxious.     Immunization History  Administered Date(s) Administered  . DTaP 12/31/2012  . Influenza, High Dose Seasonal PF 04/04/2017, 04/10/2019  . Influenza,inj,Quad PF,6+ Mos 03/28/2018  . Influenza-Unspecified 04/10/2014, 03/25/2015, 04/06/2016  . Moderna SARS-COVID-2 Vaccination 06/30/2019, 07/28/2019  . PPD Test 04/29/2014  . Pneumococcal Conjugate-13 04/28/2017  . Pneumococcal-Unspecified 03/13/2011  . Td 12/24/2012  . Tdap 04/28/2017  . Zoster Recombinat (Shingrix) 12/14/2017   Pertinent  Health Maintenance Due  Topic Date Due  . FOOT EXAM  06/27/2018  . URINE MICROALBUMIN  03/14/2019  . INFLUENZA VACCINE  01/25/2020  . HEMOGLOBIN A1C  05/02/2020  . OPHTHALMOLOGY EXAM  07/01/2020  .  PNA vac Low Risk Adult  Completed   Fall Risk  10/02/2018 09/11/2018 08/28/2018 08/08/2018 08/07/2018  Falls in the past year? 0 0 0 0 0  Number falls in past yr: 0 0 0 - 0  Injury with Fall? 0 0 0 - 0  Risk for fall due to : - - - - -   Functional Status Survey:    Vitals:   11/21/19 1014  BP: 121/70  Pulse: 75  Resp: 20  Temp: 98 F (36.7 C)  SpO2: 91%  Weight: 117 lb 1.6 oz (53.1 kg)  Height: 3\' 8"  (1.118 m)   Body mass index is 42.53 kg/m. Physical Exam Vitals and nursing note reviewed.  Constitutional:      Appearance: Normal appearance.  HENT:     Head: Normocephalic and atraumatic.     Mouth/Throat:     Mouth: Mucous membranes are moist.  Eyes:     Extraocular Movements: Extraocular movements intact.     Conjunctiva/sclera: Conjunctivae  normal.     Pupils: Pupils are equal, round, and reactive to light.  Cardiovascular:     Rate and Rhythm: Normal rate and regular rhythm.     Heart sounds: Murmur present.  Pulmonary:     Breath sounds: No rales.  Abdominal:     General: Bowel sounds are normal.     Palpations: Abdomen is soft.     Tenderness: There is no abdominal tenderness.  Musculoskeletal:     Cervical back: Normal range of motion and neck supple.     Right lower leg: No edema.     Left lower leg: No edema.     Comments: S/p Bil AKA  Skin:    General: Skin is warm and dry.  Neurological:     General: No focal deficit present.     Mental Status: He is alert and oriented to person, place, and time. Mental status is at baseline.     Motor: No weakness.     Gait: Gait abnormal.  Psychiatric:        Mood and Affect: Mood normal.        Behavior: Behavior normal.        Thought Content: Thought content normal.        Judgment: Judgment normal.     Labs reviewed: Recent Labs    02/08/19 0245 02/08/19 0245 02/09/19 0311 02/09/19 0311 02/10/19 0402 02/11/19 0000 04/10/19 0000 05/19/19 0000 10/31/19 0000  NA 129*   < > 129*   < > 128*   < > 135* 137 138  K 4.7   < > 4.4   < > 4.5   < > 4.4 4.5 4.5  CL 94*   < > 98   < > 95*   < > 99 100 102  CO2 22   < > 23   < > 25   < > 30* 27* 29*  GLUCOSE 242*  --  113*  --  176*  --   --   --   --   BUN 20   < > 27*   < > 21   < > 27* 31* 21  CREATININE 1.22   < > 1.21   < > 0.98   < > 1.0 1.1 1.2  CALCIUM 8.5*   < > 8.2*   < > 8.1*   < > 8.9 9.4 9.1  9.1   < > = values in this interval not displayed.   Recent Labs  04/10/19 0000 05/19/19 0000 10/31/19 0000  AST 15 15 14   ALT 13 16 11   ALKPHOS 64 68 62  ALBUMIN 3.0* 3.3* 3.4*   Recent Labs    02/07/19 1233 02/07/19 1233 02/08/19 0245 02/08/19 0245 02/09/19 0311 02/11/19 0000 02/25/19 0000 02/25/19 0000 04/10/19 0000 05/19/19 0000 10/31/19 0000  WBC 9.7   < > 14.3*   < > 14.5*   < > 7.5    < > 6.8 6.5 7.8  NEUTROABS  --   --   --   --  10.9*  --  3,420  --  3,876 3,510  --   HGB 11.8*   < > 11.6*   < > 9.5*   < > 10.8*   < > 11.5* 11.9* 13.1*  HCT 34.4*   < > 33.4*   < > 27.5*   < > 32*   < > 34* 35* 39*  MCV 97.5  --  96.8  --  96.8  --   --   --   --   --   --   PLT 448*   < > 454*   < > 459*   < > 494*   < > 403* 369 377   < > = values in this interval not displayed.   Lab Results  Component Value Date   TSH 1.72 10/31/2019   Lab Results  Component Value Date   HGBA1C 7.7 10/31/2019   Lab Results  Component Value Date   CHOL 132 10/31/2019   HDL 42 10/31/2019   LDLCALC 73 10/31/2019   TRIG 88 10/31/2019   CHOLHDL 2.6 08/29/2018    Significant Diagnostic Results in last 30 days:  No results found.  Assessment/Plan Paroxysmal atrial fibrillation (HCC) Heart rate is in control, continue Metoprolol, Eliquis.   Controlled type 2 diabetes mellitus with stage 3 chronic kidney disease, without long-term current use of insulin (HCC) Blood sugar is in control, continue diet, Metformin. Hgb a1c  7.7 10/31/19  Blood loss anemia Stable, continue Vit B12, Fe, Hgb 13.1 10/30/19  Constipation Stable, continue prn Senokot S, Bisacodyl suppository prn.   Neuropathic pain Phantom/neuropathic pain, s/p Bil AKA, continue Gabapentin.     Family/ staff Communication: plan of care reviewed with the patient and charge nurse.   Labs/tests ordered:  none  Time spend 25 minutes.

## 2019-11-24 ENCOUNTER — Encounter: Payer: Self-pay | Admitting: Nurse Practitioner

## 2019-11-24 NOTE — Assessment & Plan Note (Signed)
Stable, continue prn Senokot S, Bisacodyl suppository prn.

## 2019-11-24 NOTE — Assessment & Plan Note (Signed)
Stable, continue Vit B12, Fe, Hgb 13.1 10/30/19

## 2019-11-24 NOTE — Assessment & Plan Note (Signed)
Blood sugar is in control, continue diet, Metformin. Hgb a1c  7.7 10/31/19

## 2019-11-24 NOTE — Assessment & Plan Note (Signed)
Heart rate is in control, continue Metoprolol, Eliquis.  

## 2019-11-24 NOTE — Assessment & Plan Note (Signed)
Phantom/neuropathic pain, s/p Bil AKA, continue Gabapentin.

## 2019-12-18 ENCOUNTER — Encounter: Payer: Self-pay | Admitting: Nurse Practitioner

## 2019-12-18 ENCOUNTER — Non-Acute Institutional Stay (SKILLED_NURSING_FACILITY): Payer: PPO | Admitting: Nurse Practitioner

## 2019-12-18 DIAGNOSIS — K5900 Constipation, unspecified: Secondary | ICD-10-CM

## 2019-12-18 DIAGNOSIS — D500 Iron deficiency anemia secondary to blood loss (chronic): Secondary | ICD-10-CM | POA: Diagnosis not present

## 2019-12-18 DIAGNOSIS — E1122 Type 2 diabetes mellitus with diabetic chronic kidney disease: Secondary | ICD-10-CM

## 2019-12-18 DIAGNOSIS — I480 Paroxysmal atrial fibrillation: Secondary | ICD-10-CM | POA: Diagnosis not present

## 2019-12-18 DIAGNOSIS — M792 Neuralgia and neuritis, unspecified: Secondary | ICD-10-CM

## 2019-12-18 DIAGNOSIS — N183 Chronic kidney disease, stage 3 unspecified: Secondary | ICD-10-CM

## 2019-12-18 NOTE — Progress Notes (Signed)
Location:    [place] Room Number: 21 Place of Service:  SNF [phone]) Provider:  Marda Stalker, Lennie Odor NP   Virgie Dad, MD  Patient Care Team: Virgie Dad, MD as PCP - General (Internal Medicine) Buford Dresser, MD as PCP - Cardiology (Cardiology) Ricard Dillon, MD as Consulting Physician (Internal Medicine)  Extended Emergency Contact Information Primary Emergency Contact: Gordon,[place] Address: [address]          [place], [place] [address] [name] of [place] Phone: [phone] Mobile Phone: [phone] Relation: Daughter  Code Status:  DNR Goals of care: Advanced Directive information Advanced Directives 11/21/2019  Does Patient Have a Medical Advance Directive? Yes  Type of Paramedic of [place];Living will;Out of facility DNR (pink MOST or yellow form)  Does patient want to make changes to medical advance directive? No - Patient declined  Copy of [place] in Chart? Yes - validated most recent copy scanned in chart (See row information)  Pre-existing out of facility DNR order (yellow form or pink MOST form) -     Chief Complaint  Patient presents with   Medical Management of Chronic Issues    HPI:  Pt is a 86 y.o. male seen today for medical management of chronic diseases.    Afib, heart rate is in control, on Metoprolol 25mg  bid, Eliquis 2.5mg  bid.  Anemia, stable, on Fe, Vit B12.  Constipation, stable, on Senokot S II prn, prn Bisacodyl 10mg  suppository.  Peripheral neuropathy, phantom pain, stable, on Gabapentin 100mg  qd, Tylenol prn  T2 DM, blood sugar is controlled, on Metformin 500mg  bid, Simvastatin 10mg  qd.     Past Medical History:  Diagnosis Date   BPH (benign prostatic hyperplasia) 10/14/2014   Cervicalgia 01/04/2016   Chronic kidney disease    Contusion of left shoulder 05/27/14   DM type 2 (diabetes mellitus, type 2) ([place])    Dysphagia    High blood  pressure    History of basal cell cancer 11/21/2005   Right temple   History of colonic polyps 10/14/2003   Hyperlipidemia    Left knee pain 05/27/14   Peripheral neuropathic pain    Peripheral vascular disease ([place])    SAH (subarachnoid hemorrhage) ([place]) 2009   TBI   Stroke [facility])    TIA (transient ischemic attack) 11/06/2005   Left eye pain. Slurred speech. Diaphoresis. No residual effects.    Past Surgical History:  Procedure Laterality Date   ABDOMINAL AORTOGRAM W/LOWER EXTREMITY N/A 06/12/2018   Procedure: ABDOMINAL AORTOGRAM W/LOWER EXTREMITY;  Surgeon: Marty Heck, MD;  Location: [place] [facility];  Service: Cardiovascular;  Laterality: N/A;   Achilles tendon repair Left 1998   ruptured tendon   AMPUTATION Left 10/11/2018   Procedure: Left  AMPUTATION ABOVE KNEE;  Surgeon: Marty Heck, MD;  Location: [facility];  Service: Vascular;  Laterality: Left;   AMPUTATION Right 02/07/2019   Procedure: AMPUTATION ABOVE KNEE;  Surgeon: Rosetta Posner, MD;  Location: [facility];  Service: Vascular;  Laterality: Right;   COLONOSCOPY  1992   Polyp removed   COLONOSCOPY  1998   normal   ORIF FEMUR FRACTURE Right 1929   PERIPHERAL VASCULAR INTERVENTION Left 06/12/2018   Procedure: PERIPHERAL VASCULAR INTERVENTION;  Surgeon: Marty Heck, MD;  Location: [facility];  Service: Cardiovascular;  Laterality: Left;  Attempted    TONSILLECTOMY      Allergies  Allergen Reactions   Tape Other (  See Comments)    TAPE WILL TEAR THE SKIN!!!!    Allergies as of 12/18/2019      Reactions   Tape Other (See Comments)   TAPE WILL TEAR THE SKIN!!!!      Medication List       Accurate as of December 18, 2019 11:59 PM. If you have any questions, ask your nurse or doctor.        Acetaminophen 8 Hour 650 MG CR tablet Generic drug: acetaminophen Take 650 mg by mouth every 8 (eight) hours as needed for pain.   bisacodyl 10 MG suppository Commonly known as:  DULCOLAX Place 10 mg rectally daily as needed for moderate constipation.   Calcium Carbonate-Vitamin D 600-400 MG-UNIT tablet Take 1 tablet by mouth daily. Morning: 7am-11am.   D3-1000 25 MCG (1000 UT) tablet Generic drug: Cholecalciferol Take 1,000 Units by mouth daily. Morning 7am-11am.   Eliquis 2.5 MG Tabs tablet Generic drug: apixaban Take 2.5 mg by mouth 2 (two) times daily. Morning: 7am-11am Evening: 7:30pm-11pm   ferrous sulfate 325 (65 FE) MG tablet Take 325 mg by mouth. Once on Mon, Wed and Fri with meals at 1pm.   gabapentin 100 MG capsule Commonly known as: NEURONTIN Take 100 mg by mouth at bedtime. 7:30pm-11pm.   metFORMIN 500 MG tablet Commonly known as: GLUCOPHAGE Take 500 mg by mouth 2 (two) times daily with a meal. Morning: 7am-9am Evening: 5pm-7pm.   metoprolol tartrate 25 MG tablet Commonly known as: LOPRESSOR Take 25 mg by mouth 2 (two) times daily. Morning: 7am-11am Evening: 7pm-11pm   Propylene Glycol-Glycerin 1-0.3 % Soln Place 2 drops into both eyes daily as needed (dry eyes).   sennosides-docusate sodium 8.6-50 MG tablet Commonly known as: SENOKOT-S Take 2 tablets by mouth at bedtime as needed.   simvastatin 10 MG tablet Commonly known as: ZOCOR Take 10 mg by mouth daily. Between the hours of 5pm-7pm.   vitamin B-12 500 MCG tablet Commonly known as: CYANOCOBALAMIN Take 500 mcg by mouth daily.   zinc oxide 20 % ointment Apply 1 application topically as needed for irritation. To buttocks after every incontinent episode and as needed for redness. May keep at bedside. As Needed       Review of Systems  Constitutional: Negative for fatigue, fever and unexpected weight change.  HENT: Positive for hearing loss and trouble swallowing. Negative for congestion and voice change.   Eyes: Negative for visual disturbance.  Respiratory: Positive for cough. Negative for shortness of breath.   Cardiovascular: Negative for leg swelling.    Gastrointestinal: Negative for abdominal pain and constipation.  Genitourinary: Negative for difficulty urinating, dysuria and urgency.  Musculoskeletal: Positive for gait problem.  Skin: Negative for color change.  Neurological: Negative for speech difficulty, weakness, light-headedness and headaches.  Psychiatric/Behavioral: Negative for behavioral problems and sleep disturbance. The patient is not nervous/anxious.     Immunization History  Administered Date(s) Administered   DTaP 12/31/2012   Influenza, High Dose Seasonal PF 04/04/2017, 04/10/2019   Influenza,inj,Quad PF,6+ Mos 03/28/2018   Influenza-Unspecified 04/10/2014, 03/25/2015, 04/06/2016   Moderna SARS-COVID-2 Vaccination 06/30/2019, 07/28/2019   PPD Test 04/29/2014   Pneumococcal Conjugate-13 04/28/2017   Pneumococcal-Unspecified 03/13/2011   Td 12/24/2012   Tdap 04/28/2017   Zoster Recombinat (Shingrix) 12/14/2017   Pertinent  Health Maintenance Due  Topic Date Due   FOOT EXAM  06/27/2018   URINE MICROALBUMIN  03/14/2019   INFLUENZA VACCINE  01/25/2020   HEMOGLOBIN A1C  05/02/2020   OPHTHALMOLOGY EXAM  07/01/2020  PNA vac Low Risk Adult  Completed   Fall Risk  10/02/2018 09/11/2018 08/28/2018 08/08/2018 08/07/2018  Falls in the past year? 0 0 0 0 0  Number falls in past yr: 0 0 0 - 0  Injury with Fall? 0 0 0 - 0  Risk for fall due to : - - - - -   Functional Status Survey:    Vitals:   12/18/19 1428  BP: 121/66  Pulse: 73  Resp: 18  Temp: 97.8 F (36.6 C)  SpO2: 96%  Weight: 115 lb 14.4 oz (52.6 kg)  Height: 3\' 8"  (1.118 m)   Body mass index is 42.09 kg/m. Physical Exam Vitals and nursing note reviewed.  Constitutional:      Appearance: Normal appearance.  HENT:     Head: Normocephalic and atraumatic.     Mouth/Throat:     Mouth: Mucous membranes are moist.  Eyes:     Extraocular Movements: Extraocular movements intact.     Conjunctiva/sclera: Conjunctivae normal.     Pupils:  Pupils are equal, round, and reactive to light.  Cardiovascular:     Rate and Rhythm: Normal rate and regular rhythm.     Heart sounds: Murmur heard.   Pulmonary:     Breath sounds: No rales.  Abdominal:     General: Bowel sounds are normal.     Palpations: Abdomen is soft.     Tenderness: There is no abdominal tenderness.  Musculoskeletal:     Cervical back: Normal range of motion and neck supple.     Right lower leg: No edema.     Left lower leg: No edema.     Comments: S/p Bil AKA  Skin:    General: Skin is warm and dry.  Neurological:     General: No focal deficit present.     Mental Status: He is alert and oriented to person, place, and time. Mental status is at baseline.     Gait: Gait abnormal.  Psychiatric:        Mood and Affect: Mood normal.        Behavior: Behavior normal.        Thought Content: Thought content normal.        Judgment: Judgment normal.     Labs reviewed: Recent Labs    02/08/19 0245 02/08/19 0245 02/09/19 0311 02/09/19 0311 02/10/19 0402 02/11/19 0000 04/10/19 0000 05/19/19 0000 10/31/19 0000  NA 129*   < > 129*   < > 128*   < > 135* 137 138  K 4.7   < > 4.4   < > 4.5   < > 4.4 4.5 4.5  CL 94*   < > 98   < > 95*   < > 99 100 102  CO2 22   < > 23   < > 25   < > 30* 27* 29*  GLUCOSE 242*  --  113*  --  176*  --   --   --   --   BUN 20   < > 27*   < > 21   < > 27* 31* 21  CREATININE 1.22   < > 1.21   < > 0.98   < > 1.0 1.1 1.2  CALCIUM 8.5*   < > 8.2*   < > 8.1*   < > 8.9 9.4 9.1   9.1   < > = values in this interval not displayed.   Recent Labs    04/10/19 0000  05/19/19 0000 10/31/19 0000  AST 15 15 14   ALT 13 16 11   ALKPHOS 64 68 62  ALBUMIN 3.0* 3.3* 3.4*   Recent Labs    02/07/19 1233 02/07/19 1233 02/08/19 0245 02/08/19 0245 02/09/19 0311 02/11/19 0000 02/25/19 0000 02/25/19 0000 04/10/19 0000 05/19/19 0000 10/31/19 0000  WBC 9.7   < > 14.3*   < > 14.5*   < > 7.5   < > 6.8 6.5 7.8  NEUTROABS  --   --   --   --   10.9*  --  3,420  --  3,876 3,510  --   HGB 11.8*   < > 11.6*   < > 9.5*   < > 10.8*   < > 11.5* 11.9* 13.1*  HCT 34.4*   < > 33.4*   < > 27.5*   < > 32*   < > 34* 35* 39*  MCV 97.5  --  96.8  --  96.8  --   --   --   --   --   --   PLT 448*   < > 454*   < > 459*   < > 494*   < > 403* 369 377   < > = values in this interval not displayed.   Lab Results  Component Value Date   TSH 1.72 10/31/2019   Lab Results  Component Value Date   HGBA1C 7.7 10/31/2019   Lab Results  Component Value Date   CHOL 132 10/31/2019   HDL 42 10/31/2019   LDLCALC 73 10/31/2019   TRIG 88 10/31/2019   CHOLHDL 2.6 08/29/2018    Significant Diagnostic Results in last 30 days:  No results found.  Assessment/Plan Paroxysmal atrial fibrillation (HCC) Heart rate is in control, f/u cardiology yearly, last visit 08/2019, continue Metoprolol, Eliquis.   Controlled type 2 diabetes mellitus with stage 3 chronic kidney disease, without long-term current use of insulin (HCC) Stable, continue Metformin, Simvastatin, eye exam 04/2019, Hgb a1c 7.7 10/31/19  Blood loss anemia Stable, continue Fe, Vit B12, Hgb 13.1 10/30/19  Constipation Stable, continue Senokot S II prn, Bisacodyl suppository prn.   Neuropathic pain Peripheral neuropathy, phantom pain, stable, continue Gabapentin 100mg  qd, Tylenol prn      Family/ staff Communication: plan of care reviewed with the patient and charge nurse.   Labs/tests ordered:  none  Time spend 25 minutes.

## 2019-12-24 ENCOUNTER — Encounter: Payer: Self-pay | Admitting: Nurse Practitioner

## 2019-12-24 NOTE — Assessment & Plan Note (Signed)
Stable, continue Senokot S II prn, Bisacodyl suppository prn.

## 2019-12-24 NOTE — Assessment & Plan Note (Signed)
Stable, continue Metformin, Simvastatin, eye exam 04/2019, Hgb a1c 7.7 10/31/19

## 2019-12-24 NOTE — Assessment & Plan Note (Signed)
Peripheral neuropathy, phantom pain, stable, continue Gabapentin 100mg  qd, Tylenol prn

## 2019-12-24 NOTE — Assessment & Plan Note (Signed)
Stable, continue Fe, Vit B12, Hgb 13.1 10/30/19

## 2019-12-24 NOTE — Assessment & Plan Note (Signed)
Heart rate is in control, f/u cardiology yearly, last visit 08/2019, continue Metoprolol, Eliquis.

## 2020-01-20 ENCOUNTER — Non-Acute Institutional Stay (SKILLED_NURSING_FACILITY): Payer: PPO | Admitting: Nurse Practitioner

## 2020-01-20 ENCOUNTER — Encounter: Payer: Self-pay | Admitting: Nurse Practitioner

## 2020-01-20 DIAGNOSIS — D500 Iron deficiency anemia secondary to blood loss (chronic): Secondary | ICD-10-CM

## 2020-01-20 DIAGNOSIS — M159 Polyosteoarthritis, unspecified: Secondary | ICD-10-CM

## 2020-01-20 DIAGNOSIS — I129 Hypertensive chronic kidney disease with stage 1 through stage 4 chronic kidney disease, or unspecified chronic kidney disease: Secondary | ICD-10-CM | POA: Diagnosis not present

## 2020-01-20 DIAGNOSIS — K5900 Constipation, unspecified: Secondary | ICD-10-CM

## 2020-01-20 DIAGNOSIS — E1122 Type 2 diabetes mellitus with diabetic chronic kidney disease: Secondary | ICD-10-CM

## 2020-01-20 DIAGNOSIS — N183 Chronic kidney disease, stage 3 unspecified: Secondary | ICD-10-CM

## 2020-01-20 DIAGNOSIS — I480 Paroxysmal atrial fibrillation: Secondary | ICD-10-CM | POA: Diagnosis not present

## 2020-01-20 DIAGNOSIS — I952 Hypotension due to drugs: Secondary | ICD-10-CM | POA: Diagnosis not present

## 2020-01-20 NOTE — Assessment & Plan Note (Signed)
Heart rate is in control, f/u cardiology, last seen by cardiology 08/28/19, continue Metoprolol, Eliquis.

## 2020-01-20 NOTE — Assessment & Plan Note (Signed)
Stable, Hgb 13.1 10/30/19, continue Fe, Vit B12

## 2020-01-20 NOTE — Assessment & Plan Note (Signed)
Blood pressure is controlled, continue Metoprolol. 

## 2020-01-20 NOTE — Assessment & Plan Note (Deleted)
Blood pressure is controlled, continue Metoprolol. 

## 2020-01-20 NOTE — Assessment & Plan Note (Signed)
Pain is controlled in setting or peripheral neuropathy, phantom pain, continue Gabapentin, Tylenol.

## 2020-01-20 NOTE — Assessment & Plan Note (Signed)
Hgb a1c 7.7 10/2019, continue Metformin, diet.

## 2020-01-20 NOTE — Progress Notes (Addendum)
Location:    [place] Room Number: 21 Place of Service:  SNF ([phone]) Provider:  Marda Stalker, Lennie Odor NP   Virgie Dad, MD  Patient Care Team: Virgie Dad, MD as PCP - General (Internal Medicine) Buford Dresser, MD as PCP - Cardiology (Cardiology) Ricard Dillon, MD as Consulting Physician (Internal Medicine)  Extended Emergency Contact Information Primary Emergency Contact: Gordon,[place] Address: [address]          [place], [place] [address] [name] of [place] Phone: [phone] Mobile Phone: [phone] Relation: Daughter  Code Status:  DNR Goals of care: Advanced Directive information Advanced Directives 11/21/2019  Does Patient Have a Medical Advance Directive? Yes  Type of Paramedic of [place];Living will;Out of facility DNR (pink MOST or yellow form)  Does patient want to make changes to medical advance directive? No - Patient declined  Copy of [place] in Chart? Yes - validated most recent copy scanned in chart (See row information)  Pre-existing out of facility DNR order (yellow form or pink MOST form) -     Chief Complaint  Patient presents with   Medical Management of Chronic Issues    HPI:  Pt is a 86 y.o. male seen today for medical management of chronic diseases.    Afib, heart rate is in control, on Metoprolol 25mg  bid, Eliquis 2.5mg  bid.      Anemia, stable, on Fe, Vit B12.           Constipation, stable, on Senokot S II prn, prn Bisacodyl 10mg  suppository.      Peripheral neuropathy, phantom pain, stable, on Gabapentin 100mg  qd, Tylenol prn             T2 DM, blood sugar is controlled, on Metformin 500mg  bid, Simvastatin 10mg  qd.     Past Medical History:  Diagnosis Date   BPH (benign prostatic hyperplasia) 10/14/2014   Cervicalgia 01/04/2016   Chronic kidney disease    Contusion of left shoulder 05/27/14   DM type 2 (diabetes mellitus, type 2) ([place])     Dysphagia    High blood pressure    History of basal cell cancer 11/21/2005   Right temple   History of colonic polyps 10/14/2003   Hyperlipidemia    Left knee pain 05/27/14   Peripheral neuropathic pain    Peripheral vascular disease ([place])    SAH (subarachnoid hemorrhage) ([place]) 2009   TBI   Stroke [facility])    TIA (transient ischemic attack) 11/06/2005   Left eye pain. Slurred speech. Diaphoresis. No residual effects.    Past Surgical History:  Procedure Laterality Date   ABDOMINAL AORTOGRAM W/LOWER EXTREMITY N/A 06/12/2018   Procedure: ABDOMINAL AORTOGRAM W/LOWER EXTREMITY;  Surgeon: Marty Heck, MD;  Location: [facility];  Service: Cardiovascular;  Laterality: N/A;   Achilles tendon repair Left 1998   ruptured tendon   AMPUTATION Left 10/11/2018   Procedure: Left  AMPUTATION ABOVE KNEE;  Surgeon: Marty Heck, MD;  Location: [facility];  Service: Vascular;  Laterality: Left;   AMPUTATION Right 02/07/2019   Procedure: AMPUTATION ABOVE KNEE;  Surgeon: Rosetta Posner, MD;  Location: [facility];  Service: Vascular;  Laterality: Right;   COLONOSCOPY  1992   Polyp removed   COLONOSCOPY  1998   normal   ORIF FEMUR FRACTURE Right 1929   PERIPHERAL VASCULAR INTERVENTION Left 06/12/2018   Procedure: PERIPHERAL VASCULAR INTERVENTION;  Surgeon: Marty Heck, MD;  Location: [facility]  INVASIVE CV LAB;  Service: Cardiovascular;  Laterality: Left;  Attempted    TONSILLECTOMY      Allergies  Allergen Reactions   Tape Other (See Comments)    TAPE WILL TEAR THE SKIN!!!!    Allergies as of 01/20/2020      Reactions   Tape Other (See Comments)   TAPE WILL TEAR THE SKIN!!!!      Medication List       Accurate as of January 20, 2020  4:35 PM. If you have any questions, ask your nurse or doctor.        Acetaminophen 8 Hour 650 MG CR tablet Generic drug: acetaminophen Take 650 mg by mouth every 8 (eight) hours as needed for pain.   bisacodyl 10 MG  suppository Commonly known as: DULCOLAX Place 10 mg rectally daily as needed for moderate constipation.   Calcium Carbonate-Vitamin D 600-400 MG-UNIT tablet Take 1 tablet by mouth daily. Morning: 7am-11am.   D3-1000 25 MCG (1000 UT) tablet Generic drug: Cholecalciferol Take 1,000 Units by mouth daily. Morning 7am-11am.   Eliquis 2.5 MG Tabs tablet Generic drug: apixaban Take 2.5 mg by mouth 2 (two) times daily. Morning: 7am-11am Evening: 7:30pm-11pm   ferrous sulfate 325 (65 FE) MG tablet Take 325 mg by mouth. Once on Mon, Wed and Fri with meals at 1pm.   gabapentin 100 MG capsule Commonly known as: NEURONTIN Take 100 mg by mouth at bedtime. 7:30pm-11pm.   metFORMIN 500 MG tablet Commonly known as: GLUCOPHAGE Take 500 mg by mouth 2 (two) times daily with a meal. Morning: 7am-9am Evening: 5pm-7pm.   metoprolol tartrate 25 MG tablet Commonly known as: LOPRESSOR Take 25 mg by mouth 2 (two) times daily. Morning: 7am-11am Evening: 7pm-11pm   Propylene Glycol-Glycerin 1-0.3 % Soln Place 2 drops into both eyes daily as needed (dry eyes).   sennosides-docusate sodium 8.6-50 MG tablet Commonly known as: SENOKOT-S Take 2 tablets by mouth at bedtime as needed.   simvastatin 10 MG tablet Commonly known as: ZOCOR Take 10 mg by mouth daily. Between the hours of 5pm-7pm.   vitamin B-12 500 MCG tablet Commonly known as: CYANOCOBALAMIN Take 500 mcg by mouth daily.   zinc oxide 20 % ointment Apply 1 application topically as needed for irritation. To buttocks after every incontinent episode and as needed for redness. May keep at bedside. As Needed       Review of Systems  Constitutional: Negative for activity change, fever and unexpected weight change.  HENT: Positive for hearing loss and trouble swallowing. Negative for congestion and voice change.   Eyes: Negative for visual disturbance.  Respiratory: Positive for cough. Negative for shortness of breath.   Cardiovascular:  Negative for leg swelling.  Gastrointestinal: Negative for abdominal pain and constipation.  Genitourinary: Negative for difficulty urinating, dysuria and urgency.  Musculoskeletal: Positive for gait problem.  Skin: Negative for color change.  Neurological: Negative for dizziness, speech difficulty and weakness.  Psychiatric/Behavioral: Negative for confusion and sleep disturbance. The patient is not nervous/anxious.     Immunization History  Administered Date(s) Administered   DTaP 12/31/2012   Influenza, High Dose Seasonal PF 04/04/2017, 04/10/2019   Influenza,inj,Quad PF,6+ Mos 03/28/2018   Influenza-Unspecified 04/10/2014, 03/25/2015, 04/06/2016   Moderna SARS-COVID-2 Vaccination 06/30/2019, 07/28/2019   PPD Test 04/29/2014   Pneumococcal Conjugate-13 04/28/2017   Pneumococcal-Unspecified 03/13/2011   Td 12/24/2012   Tdap 04/28/2017   Zoster Recombinat (Shingrix) 12/14/2017   Pertinent  Health Maintenance Due  Topic Date Due   FOOT  EXAM  06/27/2018   URINE MICROALBUMIN  03/14/2019   INFLUENZA VACCINE  01/25/2020   HEMOGLOBIN A1C  05/02/2020   OPHTHALMOLOGY EXAM  07/01/2020   PNA vac Low Risk Adult  Completed   Fall Risk  10/02/2018 09/11/2018 08/28/2018 08/08/2018 08/07/2018  Falls in the past year? 0 0 0 0 0  Number falls in past yr: 0 0 0 - 0  Injury with Fall? 0 0 0 - 0  Risk for fall due to : - - - - -   Functional Status Survey:    Vitals:   01/20/20 1511  BP: (!) 118/52  Pulse: 70  Resp: 20  Temp: (!) 97.1 F (36.2 C)  SpO2: 93%  Weight: 116 lb 12.8 oz (53 kg)  Height: 3\' 8"  (1.118 m)   Body mass index is 42.42 kg/m. Physical Exam Vitals and nursing note reviewed.  Constitutional:      Appearance: Normal appearance.  HENT:     Head: Normocephalic and atraumatic.  Eyes:     Extraocular Movements: Extraocular movements intact.     Conjunctiva/sclera: Conjunctivae normal.     Pupils: Pupils are equal, round, and reactive to light.   Cardiovascular:     Rate and Rhythm: Normal rate and regular rhythm.     Heart sounds: Murmur heard.   Pulmonary:     Breath sounds: No rales.  Abdominal:     General: Bowel sounds are normal.     Palpations: Abdomen is soft.     Tenderness: There is no abdominal tenderness.  Musculoskeletal:     Cervical back: Normal range of motion and neck supple.     Right lower leg: No edema.     Left lower leg: No edema.     Comments: S/p Bil AKA  Skin:    General: Skin is warm and dry.  Neurological:     General: No focal deficit present.     Mental Status: He is alert and oriented to person, place, and time. Mental status is at baseline.     Comments: Not ambulatory   Psychiatric:        Mood and Affect: Mood normal.        Behavior: Behavior normal.        Thought Content: Thought content normal.        Judgment: Judgment normal.     Labs reviewed: Recent Labs    02/08/19 0245 02/08/19 0245 02/09/19 0311 02/09/19 0311 02/10/19 0402 02/11/19 0000 04/10/19 0000 05/19/19 0000 10/31/19 0000  NA 129*   < > 129*   < > 128*   < > 135* 137 138  K 4.7   < > 4.4   < > 4.5   < > 4.4 4.5 4.5  CL 94*   < > 98   < > 95*   < > 99 100 102  CO2 22   < > 23   < > 25   < > 30* 27* 29*  GLUCOSE 242*  --  113*  --  176*  --   --   --   --   BUN 20   < > 27*   < > 21   < > 27* 31* 21  CREATININE 1.22   < > 1.21   < > 0.98   < > 1.0 1.1 1.2  CALCIUM 8.5*   < > 8.2*   < > 8.1*   < > 8.9 9.4 9.1   9.1   < > =  values in this interval not displayed.   Recent Labs    04/10/19 0000 05/19/19 0000 10/31/19 0000  AST 15 15 14   ALT 13 16 11   ALKPHOS 64 68 62  ALBUMIN 3.0* 3.3* 3.4*   Recent Labs    02/07/19 1233 02/07/19 1233 02/08/19 0245 02/08/19 0245 02/09/19 0311 02/11/19 0000 02/25/19 0000 02/25/19 0000 04/10/19 0000 05/19/19 0000 10/31/19 0000  WBC 9.7   < > 14.3*   < > 14.5*   < > 7.5   < > 6.8 6.5 7.8  NEUTROABS  --   --   --   --  10.9*  --  3,420  --  3,876 3,510  --    HGB 11.8*   < > 11.6*   < > 9.5*   < > 10.8*   < > 11.5* 11.9* 13.1*  HCT 34.4*   < > 33.4*   < > 27.5*   < > 32*   < > 34* 35* 39*  MCV 97.5  --  96.8  --  96.8  --   --   --   --   --   --   PLT 448*   < > 454*   < > 459*   < > 494*   < > 403* 369 377   < > = values in this interval not displayed.   Lab Results  Component Value Date   TSH 1.72 10/31/2019   Lab Results  Component Value Date   HGBA1C 7.7 10/31/2019   Lab Results  Component Value Date   CHOL 132 10/31/2019   HDL 42 10/31/2019   LDLCALC 73 10/31/2019   TRIG 88 10/31/2019   CHOLHDL 2.6 08/29/2018    Significant Diagnostic Results in last 30 days:  No results found.  Assessment/Plan Paroxysmal atrial fibrillation (HCC) Heart rate is in control, f/u cardiology, last seen by cardiology 08/28/19, continue Metoprolol, Eliquis.   Controlled type 2 diabetes mellitus with stage 3 chronic kidney disease, without long-term current use of insulin (HCC) Hgb a1c 7.7 10/2019, continue Metformin, diet.   Generalized osteoarthritis Pain is controlled in setting or peripheral neuropathy, phantom pain, continue Gabapentin, Tylenol.   Blood loss anemia Stable, Hgb 13.1 10/30/19, continue Fe, Vit B12  Constipation Stable, continue Senokot S, Bisacodyl suppository.   Benign hypertension with chronic kidney disease, stage III (HCC) Blood pressure is controlled, continue Metoprolol.     Family/ staff Communication: plan of care reviewed with the patient and charge nurse.   Labs/tests ordered: none  Time spend 25 minutes.

## 2020-01-20 NOTE — Assessment & Plan Note (Signed)
Stable, continue Senokot S, Bisacodyl suppository.

## 2020-02-12 ENCOUNTER — Encounter: Payer: Self-pay | Admitting: Internal Medicine

## 2020-02-12 ENCOUNTER — Non-Acute Institutional Stay (SKILLED_NURSING_FACILITY): Payer: PPO | Admitting: Internal Medicine

## 2020-02-12 DIAGNOSIS — N1832 Chronic kidney disease, stage 3b: Secondary | ICD-10-CM | POA: Diagnosis not present

## 2020-02-12 DIAGNOSIS — I480 Paroxysmal atrial fibrillation: Secondary | ICD-10-CM

## 2020-02-12 DIAGNOSIS — Z89611 Acquired absence of right leg above knee: Secondary | ICD-10-CM | POA: Diagnosis not present

## 2020-02-12 DIAGNOSIS — N183 Chronic kidney disease, stage 3 unspecified: Secondary | ICD-10-CM | POA: Diagnosis not present

## 2020-02-12 DIAGNOSIS — Z89612 Acquired absence of left leg above knee: Secondary | ICD-10-CM | POA: Diagnosis not present

## 2020-02-12 DIAGNOSIS — E1122 Type 2 diabetes mellitus with diabetic chronic kidney disease: Secondary | ICD-10-CM

## 2020-02-12 NOTE — Progress Notes (Signed)
Location:  [facility] Room Number: 21-A Place of Service:  SNF [id number]) Provider:  Virgie Dad, MD  Patient Care Team: Virgie Dad, MD as PCP - General (Internal Medicine) Buford Dresser, MD as PCP - Cardiology (Cardiology) Ricard Dillon, MD as Consulting Physician (Internal Medicine)  Extended Emergency Contact Information Primary Emergency Contact: Gordon,[place] Address: [address]          [place], [place] [address] [name] of [place] Phone: [phone] Mobile Phone: [phone] Relation: Daughter  Code Status:  DNR Goals of care: Advanced Directive information Advanced Directives 11/21/2019  Does Patient Have a Medical Advance Directive? Yes  Type of Paramedic of [place];Living will;Out of facility DNR (pink MOST or yellow form)  Does patient want to make changes to medical advance directive? No - Patient declined  Copy of [place] in Chart? Yes - validated most recent copy scanned in chart (See row information)  Pre-existing out of facility DNR order (yellow form or pink MOST form) -     Chief Complaint  Patient presents with  . Medical Management of Chronic Issues    Routine [facility] visit    HPI:  Pt is a 86 y.o. male seen today for medical management of chronic diseases.    Patient has h/o Hypertension, Hyperlipidemia, Diabetes mellitus, h/o Brain stem Infarct s/p TPA In 10/19,Hyponatremia,Atrial Fibrillation on Eliquis since 10/19.,PADs/pBilateralAKA with first Left AKA and thenRight AKA   Doing well in facility Uses Electric chair to get around  Interacts with other Residents No Complains Did say he sometimes get Pain in his bottom due to sitting on wheelchair Uses Zinc Oxide Prn Phantom Pain bearable  Past Medical History:  Diagnosis Date  . BPH (benign prostatic hyperplasia) 10/14/2014  . Cervicalgia 01/04/2016  . Chronic kidney disease     . Contusion of left shoulder 05/27/14  . DM type 2 (diabetes mellitus, type 2) ([place])   . Dysphagia   . High blood pressure   . History of basal cell cancer 11/21/2005   Right temple  . History of colonic polyps 10/14/2003  . Hyperlipidemia   . Left knee pain 05/27/14  . Peripheral neuropathic pain   . Peripheral vascular disease ([employer])   . SAH (subarachnoid hemorrhage) ([place]) 2009   TBI  . Stroke ([place])   . TIA (transient ischemic attack) 11/06/2005   Left eye pain. Slurred speech. Diaphoresis. No residual effects.    Past Surgical History:  Procedure Laterality Date  . ABDOMINAL AORTOGRAM W/LOWER EXTREMITY N/A 06/12/2018   Procedure: ABDOMINAL AORTOGRAM W/LOWER EXTREMITY;  Surgeon: Marty Heck, MD;  Location: [facility] CV LAB;  Service: Cardiovascular;  Laterality: N/A;  . Achilles tendon repair Left 1998   ruptured tendon  . AMPUTATION Left 10/11/2018   Procedure: Left  AMPUTATION ABOVE KNEE;  Surgeon: Marty Heck, MD;  Location: [facility];  Service: Vascular;  Laterality: Left;  . AMPUTATION Right 02/07/2019   Procedure: AMPUTATION ABOVE KNEE;  Surgeon: Rosetta Posner, MD;  Location: [facility];  Service: Vascular;  Laterality: Right;  . COLONOSCOPY  1992   Polyp removed  . COLONOSCOPY  1998   normal  . ORIF FEMUR FRACTURE Right 1929  . PERIPHERAL VASCULAR INTERVENTION Left 06/12/2018   Procedure: PERIPHERAL VASCULAR INTERVENTION;  Surgeon: Marty Heck, MD;  Location: [facility];  Service: Cardiovascular;  Laterality: Left;  Attempted   . TONSILLECTOMY  Allergies  Allergen Reactions  . Tape Other (See Comments)    TAPE WILL TEAR THE SKIN!!!!    Outpatient Encounter Medications as of 02/12/2020  Medication Sig  . acetaminophen (ACETAMINOPHEN 8 HOUR) 650 MG CR tablet Take 650 mg by mouth every 8 (eight) hours as needed for pain.  Marland Kitchen apixaban (ELIQUIS) 2.5 MG TABS tablet Take 2.5 mg by mouth 2 (two) times daily. Morning: 7am-11am Evening:  7:30pm-11pm  . bisacodyl (DULCOLAX) 10 MG suppository Place 10 mg rectally daily as needed for moderate constipation.  . Calcium Carbonate-Vitamin D 600-400 MG-UNIT tablet Take 1 tablet by mouth daily. Morning: 7am-11am.  . Cholecalciferol (D3-1000) 25 MCG (1000 UT) tablet Take 1,000 Units by mouth daily. Morning 7am-11am.  . ferrous sulfate 325 (65 FE) MG tablet Take 325 mg by mouth. Once on Mon, Wed and Fri with meals at 1pm.  . gabapentin (NEURONTIN) 100 MG capsule Take 100 mg by mouth at bedtime. 7:30pm-11pm.  . metFORMIN (GLUCOPHAGE) 500 MG tablet Take 500 mg by mouth 2 (two) times daily with a meal. Morning: 7am-9am Evening: 5pm-7pm.  . metoprolol tartrate (LOPRESSOR) 25 MG tablet Take 25 mg by mouth 2 (two) times daily. Morning: 7am-11am Evening: 7pm-11pm  . Propylene Glycol-Glycerin 1-0.3 % SOLN Place 2 drops into both eyes daily as needed (dry eyes).   . sennosides-docusate sodium (SENOKOT-S) 8.6-50 MG tablet Take 2 tablets by mouth at bedtime as needed.   . simvastatin (ZOCOR) 10 MG tablet Take 10 mg by mouth daily. Between the hours of 5pm-7pm.  . vitamin B-12 (CYANOCOBALAMIN) 500 MCG tablet Take 500 mcg by mouth daily.  Marland Kitchen zinc oxide 20 % ointment Apply 1 application topically as needed for irritation. To buttocks after every incontinent episode and as needed for redness. May keep at bedside. As Needed   No facility-administered encounter medications on file as of 02/12/2020.    Review of Systems  Review of Systems  Constitutional: Negative for activity change, appetite change, chills, diaphoresis, fatigue and fever.  HENT: Negative for mouth sores, postnasal drip, rhinorrhea, sinus pain and sore throat.   Respiratory: Negative for apnea, cough, chest tightness, shortness of breath and wheezing.   Cardiovascular: Negative for chest pain, palpitations and leg swelling.  Gastrointestinal: Negative for abdominal distention, abdominal pain, constipation, diarrhea, nausea and vomiting.   Genitourinary: Negative for dysuria and frequency.  Musculoskeletal: Negative for arthralgias, joint swelling and myalgias.  Skin: Negative for rash.  Neurological: Negative for dizziness, syncope, weakness, light-headedness and numbness.  Psychiatric/Behavioral: Negative for behavioral problems, confusion and sleep disturbance.     Immunization History  Administered Date(s) Administered  . DTaP 12/31/2012  . Influenza, High Dose Seasonal PF 04/04/2017, 04/10/2019  . Influenza,inj,Quad PF,6+ Mos 03/28/2018  . Influenza-Unspecified 04/10/2014, 03/25/2015, 04/06/2016  . Moderna SARS-COVID-2 Vaccination 06/30/2019, 07/28/2019  . PPD Test 04/29/2014  . Pneumococcal Conjugate-13 04/28/2017  . Pneumococcal-Unspecified 03/13/2011  . Td 12/24/2012  . Tdap 04/28/2017  . Zoster Recombinat (Shingrix) 12/14/2017   Pertinent  Health Maintenance Due  Topic Date Due  . FOOT EXAM  06/27/2018  . URINE MICROALBUMIN  03/14/2019  . INFLUENZA VACCINE  01/25/2020  . HEMOGLOBIN A1C  05/02/2020  . OPHTHALMOLOGY EXAM  07/01/2020  . PNA vac Low Risk Adult  Completed   Fall Risk  10/02/2018 09/11/2018 08/28/2018 08/08/2018 08/07/2018  Falls in the past year? 0 0 0 0 0  Number falls in past yr: 0 0 0 - 0  Injury with Fall? 0 0 0 - 0  Risk  for fall due to : - - - - -   Functional Status Survey:    Vitals:   02/12/20 1522  BP: 138/76  Pulse: 64  Resp: 20  Temp: 97.7 F (36.5 C)  TempSrc: Oral  SpO2: 95%  Weight: 116 lb 6.4 oz (52.8 kg)  Height: 3\' 8"  (1.118 m)   Body mass index is 42.27 kg/m. Physical Exam  Constitutional: Oriented to person, place, and time. Well-developed and well-nourished.  HENT:  Head: Normocephalic.  Mouth/Throat: Oropharynx is clear and moist.  Eyes: Pupils are equal, round, and reactive to light.  Neck: Neck supple.  Cardiovascular: Normal rate and normal heart sounds.  Positive for [place] Pulmonary/Chest: Effort normal and breath sounds normal. No respiratory  distress. No wheezes. She has no rales.  Abdominal: Soft. Bowel sounds are normal. No distension. There is no tenderness. There is no rebound.  Musculoskeletal:  S/P Bilateral AKA Healed Lymphadenopathy: none Neurological: Alert and oriented to person, place, and time.  Skin: Skin is warm and dry.  Psychiatric: Normal mood and affect. Behavior is normal. Thought content normal.    Labs reviewed: Recent Labs    04/10/19 0000 05/19/19 0000 10/31/19 0000  NA 135* 137 138  K 4.4 4.5 4.5  CL 99 100 102  CO2 30* 27* 29*  BUN 27* 31* 21  CREATININE 1.0 1.1 1.2  CALCIUM 8.9 9.4 9.1  9.1   Recent Labs    04/10/19 0000 05/19/19 0000 10/31/19 0000  AST 15 15 14   ALT 13 16 11   ALKPHOS 64 68 62  ALBUMIN 3.0* 3.3* 3.4*   Recent Labs    02/25/19 0000 02/25/19 0000 04/10/19 0000 05/19/19 0000 10/31/19 0000  WBC 7.5   < > 6.8 6.5 7.8  NEUTROABS 3,420  --  3,876 3,510  --   HGB 10.8*   < > 11.5* 11.9* 13.1*  HCT 32*   < > 34* 35* 39*  PLT 494*   < > 403* 369 377   < > = values in this interval not displayed.   Lab Results  Component Value Date   TSH 1.72 10/31/2019   Lab Results  Component Value Date   HGBA1C 7.7 10/31/2019   Lab Results  Component Value Date   CHOL 132 10/31/2019   HDL 42 10/31/2019   LDLCALC 73 10/31/2019   TRIG 88 10/31/2019   CHOLHDL 2.6 08/29/2018    Significant Diagnostic Results in last 30 days:  No results found.  Assessment/Plan  S/P AKA (above knee amputation) bilateral (HCC) Doing well with Transfers Will talk to Therapy about getting Cushion for his Wheelchair  Paroxysmal atrial fibrillation(HCC) Continue on Eliquis and meroprolol  Controlled type 2 diabetes mellitus with stage 3 chronic kidney disease, without long-term current use of insulin (HCC) On Metformin A1C stable Stage 3b chronic kidney disease Bun and Creat stable Neuropathic pain On Low dose of Neurontin  Hyponatremia Soidum is stable  Anemia Hgb is  13.1 On Iron H/o CVA Continue Eliquis and statin Hyperlipidemia On Zocor LDL73 Family/ staff Communication:   Labs/tests ordered:

## 2020-02-24 DIAGNOSIS — M6281 Muscle weakness (generalized): Secondary | ICD-10-CM | POA: Diagnosis not present

## 2020-02-24 DIAGNOSIS — M79605 Pain in left leg: Secondary | ICD-10-CM | POA: Diagnosis not present

## 2020-02-24 DIAGNOSIS — Z4781 Encounter for orthopedic aftercare following surgical amputation: Secondary | ICD-10-CM | POA: Diagnosis not present

## 2020-02-24 DIAGNOSIS — N189 Chronic kidney disease, unspecified: Secondary | ICD-10-CM | POA: Diagnosis not present

## 2020-02-24 DIAGNOSIS — H04129 Dry eye syndrome of unspecified lacrimal gland: Secondary | ICD-10-CM | POA: Diagnosis not present

## 2020-02-24 DIAGNOSIS — E876 Hypokalemia: Secondary | ICD-10-CM | POA: Diagnosis not present

## 2020-02-24 DIAGNOSIS — R293 Abnormal posture: Secondary | ICD-10-CM | POA: Diagnosis not present

## 2020-02-24 DIAGNOSIS — E1151 Type 2 diabetes mellitus with diabetic peripheral angiopathy without gangrene: Secondary | ICD-10-CM | POA: Diagnosis not present

## 2020-02-24 DIAGNOSIS — Z89612 Acquired absence of left leg above knee: Secondary | ICD-10-CM | POA: Diagnosis not present

## 2020-02-24 DIAGNOSIS — I739 Peripheral vascular disease, unspecified: Secondary | ICD-10-CM | POA: Diagnosis not present

## 2020-02-24 DIAGNOSIS — E785 Hyperlipidemia, unspecified: Secondary | ICD-10-CM | POA: Diagnosis not present

## 2020-02-24 DIAGNOSIS — R2689 Other abnormalities of gait and mobility: Secondary | ICD-10-CM | POA: Diagnosis not present

## 2020-02-24 DIAGNOSIS — R2681 Unsteadiness on feet: Secondary | ICD-10-CM | POA: Diagnosis not present

## 2020-02-24 DIAGNOSIS — Z85828 Personal history of other malignant neoplasm of skin: Secondary | ICD-10-CM | POA: Diagnosis not present

## 2020-02-24 DIAGNOSIS — Z8673 Personal history of transient ischemic attack (TIA), and cerebral infarction without residual deficits: Secondary | ICD-10-CM | POA: Diagnosis not present

## 2020-02-24 DIAGNOSIS — R634 Abnormal weight loss: Secondary | ICD-10-CM | POA: Diagnosis not present

## 2020-02-24 DIAGNOSIS — I998 Other disorder of circulatory system: Secondary | ICD-10-CM | POA: Diagnosis not present

## 2020-02-24 DIAGNOSIS — I4891 Unspecified atrial fibrillation: Secondary | ICD-10-CM | POA: Diagnosis not present

## 2020-02-26 DIAGNOSIS — E1151 Type 2 diabetes mellitus with diabetic peripheral angiopathy without gangrene: Secondary | ICD-10-CM | POA: Diagnosis not present

## 2020-02-26 DIAGNOSIS — Z89612 Acquired absence of left leg above knee: Secondary | ICD-10-CM | POA: Diagnosis not present

## 2020-02-26 DIAGNOSIS — H04129 Dry eye syndrome of unspecified lacrimal gland: Secondary | ICD-10-CM | POA: Diagnosis not present

## 2020-02-26 DIAGNOSIS — S91101S Unspecified open wound of right great toe without damage to nail, sequela: Secondary | ICD-10-CM | POA: Diagnosis not present

## 2020-02-26 DIAGNOSIS — R293 Abnormal posture: Secondary | ICD-10-CM | POA: Diagnosis not present

## 2020-02-26 DIAGNOSIS — I129 Hypertensive chronic kidney disease with stage 1 through stage 4 chronic kidney disease, or unspecified chronic kidney disease: Secondary | ICD-10-CM | POA: Diagnosis not present

## 2020-02-26 DIAGNOSIS — Z2089 Contact with and (suspected) exposure to other communicable diseases: Secondary | ICD-10-CM | POA: Diagnosis not present

## 2020-02-26 DIAGNOSIS — R634 Abnormal weight loss: Secondary | ICD-10-CM | POA: Diagnosis not present

## 2020-02-26 DIAGNOSIS — E785 Hyperlipidemia, unspecified: Secondary | ICD-10-CM | POA: Diagnosis not present

## 2020-02-26 DIAGNOSIS — Z85828 Personal history of other malignant neoplasm of skin: Secondary | ICD-10-CM | POA: Diagnosis not present

## 2020-02-26 DIAGNOSIS — I998 Other disorder of circulatory system: Secondary | ICD-10-CM | POA: Diagnosis not present

## 2020-02-26 DIAGNOSIS — N189 Chronic kidney disease, unspecified: Secondary | ICD-10-CM | POA: Diagnosis not present

## 2020-02-26 DIAGNOSIS — Z8673 Personal history of transient ischemic attack (TIA), and cerebral infarction without residual deficits: Secondary | ICD-10-CM | POA: Diagnosis not present

## 2020-02-26 DIAGNOSIS — I739 Peripheral vascular disease, unspecified: Secondary | ICD-10-CM | POA: Diagnosis not present

## 2020-02-26 DIAGNOSIS — D500 Iron deficiency anemia secondary to blood loss (chronic): Secondary | ICD-10-CM | POA: Diagnosis not present

## 2020-02-26 DIAGNOSIS — E1122 Type 2 diabetes mellitus with diabetic chronic kidney disease: Secondary | ICD-10-CM | POA: Diagnosis not present

## 2020-02-26 DIAGNOSIS — M6281 Muscle weakness (generalized): Secondary | ICD-10-CM | POA: Diagnosis not present

## 2020-02-26 DIAGNOSIS — R2681 Unsteadiness on feet: Secondary | ICD-10-CM | POA: Diagnosis not present

## 2020-02-26 DIAGNOSIS — M79605 Pain in left leg: Secondary | ICD-10-CM | POA: Diagnosis not present

## 2020-02-27 ENCOUNTER — Encounter: Payer: Self-pay | Admitting: Nurse Practitioner

## 2020-02-27 ENCOUNTER — Non-Acute Institutional Stay (SKILLED_NURSING_FACILITY): Payer: PPO | Admitting: Nurse Practitioner

## 2020-02-27 DIAGNOSIS — I480 Paroxysmal atrial fibrillation: Secondary | ICD-10-CM | POA: Diagnosis not present

## 2020-02-27 DIAGNOSIS — E1122 Type 2 diabetes mellitus with diabetic chronic kidney disease: Secondary | ICD-10-CM

## 2020-02-27 DIAGNOSIS — K5900 Constipation, unspecified: Secondary | ICD-10-CM

## 2020-02-27 DIAGNOSIS — M792 Neuralgia and neuritis, unspecified: Secondary | ICD-10-CM

## 2020-02-27 DIAGNOSIS — N183 Chronic kidney disease, stage 3 unspecified: Secondary | ICD-10-CM

## 2020-02-27 DIAGNOSIS — D500 Iron deficiency anemia secondary to blood loss (chronic): Secondary | ICD-10-CM

## 2020-02-27 NOTE — Assessment & Plan Note (Signed)
peripheral neuropathy, phantom pain, stable, on Gabapentin 100mg  qd, Tylenol prn

## 2020-02-27 NOTE — Progress Notes (Signed)
Location:   [facility] Room Number: 21 Place of Service:  SNF (31) Provider:  Marlana Latus, NP  Virgie Dad, MD  Patient Care Team: Virgie Dad, MD as PCP - General (Internal Medicine) Buford Dresser, MD as PCP - Cardiology (Cardiology) Ricard Dillon, MD as Consulting Physician (Internal Medicine)  Extended Emergency Contact Information Primary Emergency Contact: Gordon,[place] Address: [address]          [place], [place] [address] [name] of [place] Phone: [phone] Mobile Phone: [phone] Relation: Daughter  Code Status:  DNR Goals of care: Advanced Directive information Advanced Directives 02/27/2020  Does Patient Have a Medical Advance Directive? Yes  Type of Paramedic of [place];Living will  Does patient want to make changes to medical advance directive? No - Patient declined  Copy of [place] in Chart? Yes - validated most recent copy scanned in chart (See row information)  Pre-existing out of facility DNR order (yellow form or pink MOST form) -     Chief Complaint  Patient presents with  . Medical Management of Chronic Issues    Routine follow up visit.  Marland Kitchen Best Practice Recommendations    Flu vaccine  . Quality Metric Gaps    Foot exam, urine microalbumin    HPI:  Pt is a 86 y.o. male seen today for medical management of chronic diseases.     Afib, heart rate is in control, on Metoprolol 25mg  bid, Eliquis 2.5mg  bid.       Anemia, stable, on Fe, Vit B12.   Constipation, stable, on Senokot S II prn, prn Bisacodyl 10mg  suppository.       Peripheral neuropathy, phantom pain, stable, on Gabapentin 100mg  qd, Tylenol prn T2 DM, blood sugar is controlled, on Metformin 500mg  bid, Simvastatin 10mg  qd.05/19/19 urine micro albumin 69(30-299)        Past Medical History:  Diagnosis Date  . BPH (benign prostatic hyperplasia)  10/14/2014  . Cervicalgia 01/04/2016  . Chronic kidney disease   . Contusion of left shoulder 05/27/14  . DM type 2 (diabetes mellitus, type 2) ([place])   . Dysphagia   . High blood pressure   . History of basal cell cancer 11/21/2005   Right temple  . History of colonic polyps 10/14/2003  . Hyperlipidemia   . Left knee pain 05/27/14  . Peripheral neuropathic pain   . Peripheral vascular disease ([place])   . SAH (subarachnoid hemorrhage) ([place]) 2009   TBI  . Stroke ([facility])   . TIA (transient ischemic attack) 11/06/2005   Left eye pain. Slurred speech. Diaphoresis. No residual effects.    Past Surgical History:  Procedure Laterality Date  . ABDOMINAL AORTOGRAM W/LOWER EXTREMITY N/A 06/12/2018   Procedure: ABDOMINAL AORTOGRAM W/LOWER EXTREMITY;  Surgeon: Marty Heck, MD;  Location: [facility];  Service: Cardiovascular;  Laterality: N/A;  . Achilles tendon repair Left 1998   ruptured tendon  . AMPUTATION Left 10/11/2018   Procedure: Left  AMPUTATION ABOVE KNEE;  Surgeon: Marty Heck, MD;  Location: [facility];  Service: Vascular;  Laterality: Left;  . AMPUTATION Right 02/07/2019   Procedure: AMPUTATION ABOVE KNEE;  Surgeon: Rosetta Posner, MD;  Location: [facility];  Service: Vascular;  Laterality: Right;  . COLONOSCOPY  1992   Polyp removed  . COLONOSCOPY  1998   normal  . ORIF FEMUR FRACTURE Right 1929  . PERIPHERAL VASCULAR INTERVENTION Left 06/12/2018   Procedure: PERIPHERAL VASCULAR INTERVENTION;  Surgeon: Marty Heck, MD;  Location: [facility];  Service: Cardiovascular;  Laterality: Left;  Attempted   . TONSILLECTOMY      Allergies  Allergen Reactions  . Tape Other (See Comments)    TAPE WILL TEAR THE SKIN!!!!    Allergies as of 02/27/2020      Reactions   Tape Other (See Comments)   TAPE WILL TEAR THE SKIN!!!!      Medication List       Accurate as of February 27, 2020 11:59 PM. If you have any questions, ask your nurse or doctor.          Acetaminophen 8 Hour 650 MG CR tablet Generic drug: acetaminophen Take 650 mg by mouth every 8 (eight) hours as needed for pain.   bisacodyl 10 MG suppository Commonly known as: DULCOLAX Place 10 mg rectally daily as needed for moderate constipation.   Calcium Carbonate-Vitamin D 600-400 MG-UNIT tablet Take 1 tablet by mouth daily. Morning: 7am-11am.   D3-1000 25 MCG (1000 UT) tablet Generic drug: Cholecalciferol Take 1,000 Units by mouth daily. Morning 7am-11am.   Eliquis 2.5 MG Tabs tablet Generic drug: apixaban Take 2.5 mg by mouth 2 (two) times daily. Morning: 7am-11am Evening: 7:30pm-11pm   ferrous sulfate 325 (65 FE) MG tablet Take 325 mg by mouth. Once on Mon, Wed and Fri with meals at 1pm.   gabapentin 100 MG capsule Commonly known as: NEURONTIN Take 100 mg by mouth at bedtime. 7:30pm-11pm.   metFORMIN 500 MG tablet Commonly known as: GLUCOPHAGE Take 500 mg by mouth 2 (two) times daily with a meal. Morning: 7am-9am Evening: 5pm-7pm.   metoprolol tartrate 25 MG tablet Commonly known as: LOPRESSOR Take 25 mg by mouth 2 (two) times daily. Morning: 7am-11am Evening: 7pm-11pm   Propylene Glycol-Glycerin 1-0.3 % Soln Place 2 drops into both eyes daily as needed (dry eyes).   sennosides-docusate sodium 8.6-50 MG tablet Commonly known as: SENOKOT-S Take 2 tablets by mouth at bedtime as needed.   simvastatin 10 MG tablet Commonly known as: ZOCOR Take 10 mg by mouth daily. Between the hours of 5pm-7pm.   vitamin B-12 500 MCG tablet Commonly known as: CYANOCOBALAMIN Take 500 mcg by mouth daily.   zinc oxide 20 % ointment Apply 1 application topically as needed for irritation. To buttocks after every incontinent episode and as needed for redness. May keep at bedside. As Needed       Review of Systems  Constitutional: Positive for fatigue. Negative for fever and unexpected weight change.  HENT: Positive for hearing loss and trouble swallowing. Negative for  congestion and voice change.   Eyes: Negative for visual disturbance.  Respiratory: Positive for cough. Negative for shortness of breath.   Cardiovascular: Negative for leg swelling.  Gastrointestinal: Negative for abdominal pain and constipation.  Genitourinary: Negative for difficulty urinating, dysuria and urgency.  Musculoskeletal: Positive for gait problem.  Skin: Negative for color change.  Neurological: Negative for speech difficulty, weakness and light-headedness.  Psychiatric/Behavioral: Negative for confusion and sleep disturbance. The patient is not nervous/anxious.     Immunization History  Administered Date(s) Administered  . DTaP 12/31/2012  . Influenza, High Dose Seasonal PF 04/04/2017, 04/10/2019  . Influenza,inj,Quad PF,6+ Mos 03/28/2018  . Influenza-Unspecified 04/10/2014, 03/25/2015, 04/06/2016  . Moderna SARS-COVID-2 Vaccination 06/30/2019, 07/28/2019  . PPD Test 04/29/2014  . Pneumococcal Conjugate-13 04/28/2017  . Pneumococcal-Unspecified 03/13/2011  . Td 12/24/2012  . Tdap 04/28/2017  . Zoster Recombinat (Shingrix) 12/14/2017   Pertinent  Health  Maintenance Due  Topic Date Due  . FOOT EXAM  06/27/2018  . URINE MICROALBUMIN  03/14/2019  . INFLUENZA VACCINE  01/25/2020  . HEMOGLOBIN A1C  05/02/2020  . OPHTHALMOLOGY EXAM  07/01/2020  . PNA vac Low Risk Adult  Completed   Fall Risk  10/02/2018 09/11/2018 08/28/2018 08/08/2018 08/07/2018  Falls in the past year? 0 0 0 0 0  Number falls in past yr: 0 0 0 - 0  Injury with Fall? 0 0 0 - 0  Risk for fall due to : - - - - -   Functional Status Survey:    Vitals:   02/27/20 0818  BP: 109/60  Pulse: 65  Resp: 20  Temp: (!) 97.3 F (36.3 C)  SpO2: 95%  Weight: 117 lb (53.1 kg)  Height: 3\' 8"  (1.118 m)   Body mass index is 42.49 kg/m. Physical Exam Vitals and nursing note reviewed.  Constitutional:      Appearance: Normal appearance.  HENT:     Head: Normocephalic and atraumatic.     Mouth/Throat:      Mouth: Mucous membranes are moist.  Eyes:     Extraocular Movements: Extraocular movements intact.     Conjunctiva/sclera: Conjunctivae normal.     Pupils: Pupils are equal, round, and reactive to light.     Comments: Right lower eyelid ectropion   Cardiovascular:     Rate and Rhythm: Normal rate and regular rhythm.     Heart sounds: Murmur heard.   Pulmonary:     Breath sounds: Rales present.     Comments: Left basilar rales.  Abdominal:     Palpations: Abdomen is soft.     Tenderness: There is no abdominal tenderness.  Musculoskeletal:     Cervical back: Normal range of motion and neck supple.     Right lower leg: No edema.     Left lower leg: No edema.     Comments: S/p Bil AKA  Skin:    General: Skin is warm and dry.  Neurological:     General: No focal deficit present.     Mental Status: He is alert and oriented to person, place, and time. Mental status is at baseline.     Comments: Not ambulatory   Psychiatric:        Mood and Affect: Mood normal.        Behavior: Behavior normal.        Thought Content: Thought content normal.        Judgment: Judgment normal.     Labs reviewed: Recent Labs    04/10/19 0000 05/19/19 0000 10/31/19 0000  NA 135* 137 138  K 4.4 4.5 4.5  CL 99 100 102  CO2 30* 27* 29*  BUN 27* 31* 21  CREATININE 1.0 1.1 1.2  CALCIUM 8.9 9.4 9.1  9.1   Recent Labs    04/10/19 0000 05/19/19 0000 10/31/19 0000  AST 15 15 14   ALT 13 16 11   ALKPHOS 64 68 62  ALBUMIN 3.0* 3.3* 3.4*   Recent Labs    04/10/19 0000 05/19/19 0000 10/31/19 0000  WBC 6.8 6.5 7.8  NEUTROABS 3,876 3,510  --   HGB 11.5* 11.9* 13.1*  HCT 34* 35* 39*  PLT 403* 369 377   Lab Results  Component Value Date   TSH 1.72 10/31/2019   Lab Results  Component Value Date   HGBA1C 7.7 10/31/2019   Lab Results  Component Value Date   CHOL 132 10/31/2019  HDL 42 10/31/2019   LDLCALC 73 10/31/2019   TRIG 88 10/31/2019   CHOLHDL 2.6 08/29/2018    Significant  Diagnostic Results in last 30 days:  No results found.  Assessment/Plan Paroxysmal atrial fibrillation (HCC) Heart rate is in control, continue Metoprolol, Eliquis.   Controlled type 2 diabetes mellitus with stage 3 chronic kidney disease, without long-term current use of insulin (HCC) Hgb a1c 7.7 10/2019, continue Metformin 05/19/19 urine micro albumin 69(30-299)   Constipation Stable, continue Senokot S, Bisacodyl suppository.   Neuropathic pain peripheral neuropathy, phantom pain, stable, on Gabapentin 100mg  qd, Tylenol prn   Blood loss anemia Stable, continue Fe, Vit B12, Hgb 13.1 10/2019     Family/ staff Communication: plan of care reviewed with the patient and charge nurse.   Labs/tests ordered:  none  Time spend 35 minutes.

## 2020-02-27 NOTE — Assessment & Plan Note (Signed)
Stable, continue Fe, Vit B12, Hgb 13.1 10/2019

## 2020-02-27 NOTE — Assessment & Plan Note (Signed)
Heart rate is in control, continue Metoprolol, Eliquis.  

## 2020-02-27 NOTE — Assessment & Plan Note (Addendum)
Hgb a1c 7.7 10/2019, continue Metformin 05/19/19 urine micro albumin 69(30-299)

## 2020-02-27 NOTE — Assessment & Plan Note (Signed)
Stable, continue Senokot S, Bisacodyl suppository.

## 2020-03-02 ENCOUNTER — Encounter: Payer: Self-pay | Admitting: Nurse Practitioner

## 2020-03-30 ENCOUNTER — Non-Acute Institutional Stay (SKILLED_NURSING_FACILITY): Payer: PPO | Admitting: Nurse Practitioner

## 2020-03-30 ENCOUNTER — Encounter: Payer: Self-pay | Admitting: Nurse Practitioner

## 2020-03-30 DIAGNOSIS — I480 Paroxysmal atrial fibrillation: Secondary | ICD-10-CM

## 2020-03-30 DIAGNOSIS — N183 Chronic kidney disease, stage 3 unspecified: Secondary | ICD-10-CM

## 2020-03-30 DIAGNOSIS — K5900 Constipation, unspecified: Secondary | ICD-10-CM

## 2020-03-30 DIAGNOSIS — E1122 Type 2 diabetes mellitus with diabetic chronic kidney disease: Secondary | ICD-10-CM

## 2020-03-30 DIAGNOSIS — D500 Iron deficiency anemia secondary to blood loss (chronic): Secondary | ICD-10-CM | POA: Diagnosis not present

## 2020-03-30 DIAGNOSIS — M792 Neuralgia and neuritis, unspecified: Secondary | ICD-10-CM

## 2020-03-30 NOTE — Assessment & Plan Note (Signed)
Constipation, stable, on Senokot S II prn, prn Bisacodyl 10mg  suppository.

## 2020-03-30 NOTE — Assessment & Plan Note (Signed)
T2 DM, blood sugar is controlled, on Metformin 500mg  bid, Simvastatin 10mg  qd.05/19/19 urine micro albumin 69(30-299). Hgb a1c 7.7 10/31/19. Update Hgb a1c

## 2020-03-30 NOTE — Assessment & Plan Note (Signed)
Peripheral neuropathy, phantom pain, stable, on Gabapentin 100mg  qd, Tylenol prn

## 2020-03-30 NOTE — Assessment & Plan Note (Signed)
Anemia, stable, dc Fe, continue Vit B12. Hgb 13.1 10/31/19, update CBC one month.

## 2020-03-30 NOTE — Progress Notes (Signed)
Location:    [facility] Room Number: 21 Place of Service:  SNF (31) Provider: Lennie Odor Shiara Mcgough NP  Virgie Dad, MD  Patient Care Team: Virgie Dad, MD as PCP - General (Internal Medicine) Buford Dresser, MD as PCP - Cardiology (Cardiology) Ricard Dillon, MD as Consulting Physician (Internal Medicine)  Extended Emergency Contact Information Primary Emergency Contact: Gordon,[place] Address: [address]          [place], [place] [address] [name] of [employer] Phone: [phone] Mobile Phone: [phone] Relation: Daughter  Code Status:  DNR Goals of care: Advanced Directive information Advanced Directives 03/30/2020  Does Patient Have a Medical Advance Directive? Yes  Type of Advance Directive Living will;Healthcare Power of Attorney  Does patient want to make changes to medical advance directive? No - Patient declined  Copy of [place] in Chart? Yes - validated most recent copy scanned in chart (See row information)  Pre-existing out of facility DNR order (yellow form or pink MOST form) -     Chief Complaint  Patient presents with  . Medical Management of Chronic Issues  . Health Maintenance    Foot exam, urine microalbumin    HPI:  Pt is a 86 y.o. male seen today for medical management of chronic diseases.    Anemia, stable, on Fe, Vit B12. Hgb 13.1 10/31/19             Constipation, stable, on Senokot S II prn, prn Bisacodyl 10mg  suppository.                     Peripheral neuropathy, phantom pain, stable, on Gabapentin 100mg  qd, Tylenol prn T2 DM, blood sugar is controlled, on Metformin 500mg  bid, Simvastatin 10mg  qd.05/19/19 urine micro albumin 69(30-299). Hgb a1c 7.7 10/31/19  Afib, heart rate is in control, on Metoprolol 25mg  bid, Eliquis 2.5mg  bid    Past Medical History:  Diagnosis Date  . BPH (benign prostatic hyperplasia) 10/14/2014  . Cervicalgia 01/04/2016  . Chronic  kidney disease   . Contusion of left shoulder 05/27/14  . DM type 2 (diabetes mellitus, type 2) ([place])   . Dysphagia   . High blood pressure   . History of basal cell cancer 11/21/2005   Right temple  . History of colonic polyps 10/14/2003  . Hyperlipidemia   . Left knee pain 05/27/14  . Peripheral neuropathic pain   . Peripheral vascular disease ([facility])   . SAH (subarachnoid hemorrhage) ([facility]) 2009   TBI  . Stroke ([facility])   . TIA (transient ischemic attack) 11/06/2005   Left eye pain. Slurred speech. Diaphoresis. No residual effects.    Past Surgical History:  Procedure Laterality Date  . ABDOMINAL AORTOGRAM W/LOWER EXTREMITY N/A 06/12/2018   Procedure: ABDOMINAL AORTOGRAM W/LOWER EXTREMITY;  Surgeon: Marty Heck, MD;  Location: [facility];  Service: Cardiovascular;  Laterality: N/A;  . Achilles tendon repair Left 1998   ruptured tendon  . AMPUTATION Left 10/11/2018   Procedure: Left  AMPUTATION ABOVE KNEE;  Surgeon: Marty Heck, MD;  Location: [facility];  Service: Vascular;  Laterality: Left;  . AMPUTATION Right 02/07/2019   Procedure: AMPUTATION ABOVE KNEE;  Surgeon: Rosetta Posner, MD;  Location: [facility];  Service: Vascular;  Laterality: Right;  . COLONOSCOPY  1992   Polyp removed  . COLONOSCOPY  1998   normal  . ORIF FEMUR FRACTURE Right 1929  . PERIPHERAL VASCULAR INTERVENTION Left 06/12/2018   Procedure:  PERIPHERAL VASCULAR INTERVENTION;  Surgeon: Marty Heck, MD;  Location: [facility];  Service: Cardiovascular;  Laterality: Left;  Attempted   . TONSILLECTOMY      Allergies  Allergen Reactions  . Tape Other (See Comments)    TAPE WILL TEAR THE SKIN!!!!    Allergies as of 03/30/2020      Reactions   Tape Other (See Comments)   TAPE WILL TEAR THE SKIN!!!!      Medication List       Accurate as of March 30, 2020 11:59 PM. If you have any questions, ask your nurse or doctor.        Acetaminophen 8 Hour 650 MG CR tablet Generic drug:  acetaminophen Take 650 mg by mouth every 8 (eight) hours as needed for pain.   bisacodyl 10 MG suppository Commonly known as: DULCOLAX Place 10 mg rectally daily as needed for moderate constipation.   Calcium Carbonate-Vitamin D 600-400 MG-UNIT tablet Take 1 tablet by mouth daily. Morning: 7am-11am.   D3-1000 25 MCG (1000 UT) tablet Generic drug: Cholecalciferol Take 1,000 Units by mouth daily. Morning 7am-11am.   Eliquis 2.5 MG Tabs tablet Generic drug: apixaban Take 2.5 mg by mouth 2 (two) times daily. Morning: 7am-11am Evening: 7:30pm-11pm   ferrous sulfate 325 (65 FE) MG tablet Take 325 mg by mouth. Once on Mon, Wed and Fri with meals at 1pm.   gabapentin 100 MG capsule Commonly known as: NEURONTIN Take 100 mg by mouth at bedtime. 7:30pm-11pm.   metFORMIN 500 MG tablet Commonly known as: GLUCOPHAGE Take 500 mg by mouth 2 (two) times daily with a meal. Morning: 7am-9am Evening: 5pm-7pm.   metoprolol tartrate 25 MG tablet Commonly known as: LOPRESSOR Take 25 mg by mouth 2 (two) times daily. Morning: 7am-11am Evening: 7pm-11pm   Propylene Glycol-Glycerin 1-0.3 % Soln Place 2 drops into both eyes daily as needed (dry eyes).   sennosides-docusate sodium 8.6-50 MG tablet Commonly known as: SENOKOT-S Take 2 tablets by mouth at bedtime as needed.   simvastatin 10 MG tablet Commonly known as: ZOCOR Take 10 mg by mouth daily. Between the hours of 5pm-7pm.   vitamin B-12 500 MCG tablet Commonly known as: CYANOCOBALAMIN Take 500 mcg by mouth daily.   zinc oxide 20 % ointment Apply 1 application topically as needed for irritation. To buttocks after every incontinent episode and as needed for redness. May keep at bedside. As Needed       Review of Systems  Constitutional: Negative for activity change, fever and unexpected weight change.  HENT: Positive for hearing loss and trouble swallowing. Negative for congestion.   Eyes: Negative for visual disturbance.   Respiratory: Positive for cough. Negative for shortness of breath.   Cardiovascular: Negative for leg swelling.  Gastrointestinal: Negative for abdominal pain and constipation.  Genitourinary: Negative for difficulty urinating, dysuria and urgency.  Musculoskeletal: Positive for gait problem.  Skin: Negative for color change.  Neurological: Negative for dizziness, speech difficulty, weakness and headaches.  Psychiatric/Behavioral: Negative for confusion and sleep disturbance. The patient is not nervous/anxious.     Immunization History  Administered Date(s) Administered  . DTaP 12/31/2012  . Influenza, High Dose Seasonal PF 04/04/2017, 04/10/2019  . Influenza,inj,Quad PF,6+ Mos 03/28/2018  . Influenza-Unspecified 04/10/2014, 03/25/2015, 04/06/2016  . Moderna SARS-COVID-2 Vaccination 06/30/2019, 07/28/2019  . PPD Test 04/29/2014  . Pneumococcal Conjugate-13 04/28/2017  . Pneumococcal-Unspecified 03/13/2011  . Td 12/24/2012  . Tdap 04/28/2017  . Zoster Recombinat (Shingrix) 12/14/2017   Pertinent  Health  Maintenance Due  Topic Date Due  . FOOT EXAM  06/27/2018  . URINE MICROALBUMIN  03/14/2019  . INFLUENZA VACCINE  01/25/2020  . HEMOGLOBIN A1C  05/02/2020  . OPHTHALMOLOGY EXAM  07/01/2020  . PNA vac Low Risk Adult  Completed   Fall Risk  10/02/2018 09/11/2018 08/28/2018 08/08/2018 08/07/2018  Falls in the past year? 0 0 0 0 0  Number falls in past yr: 0 0 0 - 0  Injury with Fall? 0 0 0 - 0  Risk for fall due to : - - - - -   Functional Status Survey:    Vitals:   03/30/20 1515  BP: (!) 125/55  Pulse: 72  Resp: 20  Temp: 98 F (36.7 C)  SpO2: 96%  Weight: 117 lb (53.1 kg)  Height: 3\' 8"  (1.118 m)   Body mass index is 42.49 kg/m. Physical Exam Vitals and nursing note reviewed.  Constitutional:      Appearance: Normal appearance.  HENT:     Head: Normocephalic and atraumatic.     Nose: Nose normal.     Mouth/Throat:     Mouth: Mucous membranes are moist.  Eyes:      Extraocular Movements: Extraocular movements intact.     Conjunctiva/sclera: Conjunctivae normal.     Pupils: Pupils are equal, round, and reactive to light.     Comments: Right lower eyelid ectropion   Cardiovascular:     Rate and Rhythm: Normal rate and regular rhythm.     Heart sounds: Murmur heard.   Pulmonary:     Breath sounds: Rales present.     Comments: Left basilar rales.  Abdominal:     Palpations: Abdomen is soft.     Tenderness: There is no abdominal tenderness.  Musculoskeletal:     Cervical back: Normal range of motion and neck supple.     Right lower leg: No edema.     Left lower leg: No edema.     Comments: S/p Bil AKA  Skin:    General: Skin is warm and dry.  Neurological:     General: No focal deficit present.     Mental Status: He is alert and oriented to person, place, and time. Mental status is at baseline.     Comments: Not ambulatory   Psychiatric:        Mood and Affect: Mood normal.        Behavior: Behavior normal.        Thought Content: Thought content normal.        Judgment: Judgment normal.     Labs reviewed: Recent Labs    04/10/19 0000 05/19/19 0000 10/31/19 0000  NA 135* 137 138  K 4.4 4.5 4.5  CL 99 100 102  CO2 30* 27* 29*  BUN 27* 31* 21  CREATININE 1.0 1.1 1.2  CALCIUM 8.9 9.4 9.1  9.1   Recent Labs    04/10/19 0000 05/19/19 0000 10/31/19 0000  AST 15 15 14   ALT 13 16 11   ALKPHOS 64 68 62  ALBUMIN 3.0* 3.3* 3.4*   Recent Labs    04/10/19 0000 05/19/19 0000 10/31/19 0000  WBC 6.8 6.5 7.8  NEUTROABS 3,876 3,510  --   HGB 11.5* 11.9* 13.1*  HCT 34* 35* 39*  PLT 403* 369 377   Lab Results  Component Value Date   TSH 1.72 10/31/2019   Lab Results  Component Value Date   HGBA1C 7.7 10/31/2019   Lab Results  Component Value Date  CHOL 132 10/31/2019   HDL 42 10/31/2019   LDLCALC 73 10/31/2019   TRIG 88 10/31/2019   CHOLHDL 2.6 08/29/2018    Significant Diagnostic Results in last 30 days:  No  results found.  Assessment/Plan Blood loss anemia Anemia, stable, dc Fe, continue Vit B12. Hgb 13.1 10/31/19, update CBC one month.    Constipation Constipation, stable, on Senokot S II prn, prn Bisacodyl 10mg  suppository.  Neuropathic pain Peripheral neuropathy, phantom pain, stable, on Gabapentin 100mg  qd, Tylenol prn   Controlled type 2 diabetes mellitus with stage 3 chronic kidney disease, without long-term current use of insulin (HCC) T2 DM, blood sugar is controlled, on Metformin 500mg  bid, Simvastatin 10mg  qd.05/19/19 urine micro albumin 69(30-299). Hgb a1c 7.7 10/31/19. Update Hgb a1c  Paroxysmal atrial fibrillation (HCC) Afib, heart rate is in control, on Metoprolol 25mg  bid, Eliquis 2.5mg  bid. See cardiology yearly, last visit was 08/28/19    Family/ staff Communication: plan of care reviewed with the patient and charge nurse.   Labs/tests ordered:  none  Time spend 35 minutes.

## 2020-03-30 NOTE — Assessment & Plan Note (Signed)
Afib, heart rate is in control, on Metoprolol 25mg  bid, Eliquis 2.5mg  bid. See cardiology yearly, last visit was 08/28/19

## 2020-03-31 ENCOUNTER — Encounter: Payer: Self-pay | Admitting: Nurse Practitioner

## 2020-04-14 ENCOUNTER — Encounter: Payer: Self-pay | Admitting: Internal Medicine

## 2020-04-14 ENCOUNTER — Non-Acute Institutional Stay (SKILLED_NURSING_FACILITY): Payer: PPO | Admitting: Internal Medicine

## 2020-04-14 DIAGNOSIS — Z89612 Acquired absence of left leg above knee: Secondary | ICD-10-CM

## 2020-04-14 DIAGNOSIS — K648 Other hemorrhoids: Secondary | ICD-10-CM | POA: Diagnosis not present

## 2020-04-14 DIAGNOSIS — K5900 Constipation, unspecified: Secondary | ICD-10-CM | POA: Diagnosis not present

## 2020-04-14 DIAGNOSIS — Z89611 Acquired absence of right leg above knee: Secondary | ICD-10-CM

## 2020-04-14 DIAGNOSIS — E1122 Type 2 diabetes mellitus with diabetic chronic kidney disease: Secondary | ICD-10-CM | POA: Diagnosis not present

## 2020-04-14 DIAGNOSIS — N183 Chronic kidney disease, stage 3 unspecified: Secondary | ICD-10-CM | POA: Diagnosis not present

## 2020-04-14 DIAGNOSIS — I480 Paroxysmal atrial fibrillation: Secondary | ICD-10-CM | POA: Diagnosis not present

## 2020-04-14 DIAGNOSIS — E785 Hyperlipidemia, unspecified: Secondary | ICD-10-CM | POA: Diagnosis not present

## 2020-04-14 NOTE — Progress Notes (Signed)
Location:    [facility] Room Number: 21 Place of Service:  SNF [id number]) Provider:  Veleta Miners MD  Virgie Dad, MD  Patient Care Team: Virgie Dad, MD as PCP - General (Internal Medicine) Buford Dresser, MD as PCP - Cardiology (Cardiology) Ricard Dillon, MD as Consulting Physician (Internal Medicine)  Extended Emergency Contact Information Primary Emergency Contact: Gordon,[place] Address: [address]          [place], [place] [address] [name] of [place] Phone: [phone] Mobile Phone: [phone] Relation: Daughter  Code Status:  DNR Goals of care: Advanced Directive information Advanced Directives 03/30/2020  Does Patient Have a Medical Advance Directive? Yes  Type of Advance Directive Living will;Healthcare Power of Attorney  Does patient want to make changes to medical advance directive? No - Patient declined  Copy of [place] in Chart? Yes - validated most recent copy scanned in chart (See row information)  Pre-existing out of facility DNR order (yellow form or pink MOST form) -     Chief Complaint  Patient presents with  . Acute Visit    Bright red blood per rectum    HPI:  Pt is a 86 y.o. male seen today for an acute visit for BRPR  Patient has h/o Hypertension, Hyperlipidemia, Diabetes mellitus, h/o Brain stem Infarct s/p TPA In 10/19,Hyponatremia,Atrial Fibrillation on Eliquis since 10/19.,PADs/pBilateralAKA with first Left AKA and thenRight AKA  Noticed to have Hard stools with BRPR. HAS had 3-4 times in past few days Denies any pain or abdominal pain     Past Medical History:  Diagnosis Date  . BPH (benign prostatic hyperplasia) 10/14/2014  . Cervicalgia 01/04/2016  . Chronic kidney disease   . Contusion of left shoulder 05/27/14  . DM type 2 (diabetes mellitus, type 2) ([facility])   . Dysphagia   . High blood pressure   . History of basal cell cancer 11/21/2005   Right  temple  . History of colonic polyps 10/14/2003  . Hyperlipidemia   . Left knee pain 05/27/14  . Peripheral neuropathic pain   . Peripheral vascular disease ([facility])   . SAH (subarachnoid hemorrhage) ([facility]) 2009   TBI  . Stroke ([facility])   . TIA (transient ischemic attack) 11/06/2005   Left eye pain. Slurred speech. Diaphoresis. No residual effects.    Past Surgical History:  Procedure Laterality Date  . ABDOMINAL AORTOGRAM W/LOWER EXTREMITY N/A 06/12/2018   Procedure: ABDOMINAL AORTOGRAM W/LOWER EXTREMITY;  Surgeon: Marty Heck, MD;  Location: [facility];  Service: Cardiovascular;  Laterality: N/A;  . Achilles tendon repair Left 1998   ruptured tendon  . AMPUTATION Left 10/11/2018   Procedure: Left  AMPUTATION ABOVE KNEE;  Surgeon: Marty Heck, MD;  Location: [facility];  Service: Vascular;  Laterality: Left;  . AMPUTATION Right 02/07/2019   Procedure: AMPUTATION ABOVE KNEE;  Surgeon: Rosetta Posner, MD;  Location: [facility];  Service: Vascular;  Laterality: Right;  . COLONOSCOPY  1992   Polyp removed  . COLONOSCOPY  1998   normal  . ORIF FEMUR FRACTURE Right 1929  . PERIPHERAL VASCULAR INTERVENTION Left 06/12/2018   Procedure: PERIPHERAL VASCULAR INTERVENTION;  Surgeon: Marty Heck, MD;  Location: [facility];  Service: Cardiovascular;  Laterality: Left;  Attempted   . TONSILLECTOMY      Allergies  Allergen Reactions  . Tape Other (See Comments)    TAPE WILL TEAR THE SKIN!!!!    Allergies as  of 04/14/2020      Reactions   Tape Other (See Comments)   TAPE WILL TEAR THE SKIN!!!!      Medication List       Accurate as of April 14, 2020  9:57 AM. If you have any questions, ask your nurse or doctor.        STOP taking these medications   ferrous sulfate 325 (65 FE) MG tablet Stopped by: Virgie Dad, MD     TAKE these medications   Acetaminophen 8 Hour 650 MG CR tablet Generic drug: acetaminophen Take 650 mg by mouth every 8 (eight) hours  as needed for pain.   bisacodyl 10 MG suppository Commonly known as: DULCOLAX Place 10 mg rectally daily as needed for moderate constipation.   Calcium Carbonate-Vitamin D 600-400 MG-UNIT tablet Take 1 tablet by mouth daily. Morning: 7am-11am.   D3-1000 25 MCG (1000 UT) tablet Generic drug: Cholecalciferol Take 1,000 Units by mouth daily. Morning 7am-11am.   Eliquis 2.5 MG Tabs tablet Generic drug: apixaban Take 2.5 mg by mouth 2 (two) times daily. Morning: 7am-11am Evening: 7:30pm-11pm   gabapentin 100 MG capsule Commonly known as: NEURONTIN Take 100 mg by mouth at bedtime. 7:30pm-11pm.   metFORMIN 500 MG tablet Commonly known as: GLUCOPHAGE Take 500 mg by mouth 2 (two) times daily with a meal. Morning: 7am-9am Evening: 5pm-7pm.   metoprolol tartrate 25 MG tablet Commonly known as: LOPRESSOR Take 25 mg by mouth 2 (two) times daily. Morning: 7am-11am Evening: 7pm-11pm   Propylene Glycol-Glycerin 1-0.3 % Soln Place 2 drops into both eyes daily as needed (dry eyes).   sennosides-docusate sodium 8.6-50 MG tablet Commonly known as: SENOKOT-S Take 2 tablets by mouth at bedtime as needed.   simvastatin 10 MG tablet Commonly known as: ZOCOR Take 10 mg by mouth daily. Between the hours of 5pm-7pm.   vitamin B-12 500 MCG tablet Commonly known as: CYANOCOBALAMIN Take 500 mcg by mouth daily.   zinc oxide 20 % ointment Apply 1 application topically as needed for irritation. To buttocks after every incontinent episode and as needed for redness. May keep at bedside. As Needed       Review of Systems  Constitutional: Negative.   HENT: Negative.   Respiratory: Negative.   Cardiovascular: Negative.   Gastrointestinal: Positive for blood in stool and constipation. Negative for abdominal pain.  Genitourinary: Negative.   Musculoskeletal: Negative.   Neurological: Negative.   Psychiatric/Behavioral: Negative.     Immunization History  Administered Date(s) Administered    . DTaP 12/31/2012  . Influenza, High Dose Seasonal PF 04/04/2017, 04/10/2019  . Influenza,inj,Quad PF,6+ Mos 03/28/2018  . Influenza-Unspecified 04/10/2014, 03/25/2015, 04/06/2016  . Moderna SARS-COVID-2 Vaccination 06/30/2019, 07/28/2019  . PPD Test 04/29/2014  . Pneumococcal Conjugate-13 04/28/2017  . Pneumococcal-Unspecified 03/13/2011  . Td 12/24/2012  . Tdap 04/28/2017  . Zoster Recombinat (Shingrix) 12/14/2017   Pertinent  Health Maintenance Due  Topic Date Due  . FOOT EXAM  06/27/2018  . URINE MICROALBUMIN  03/14/2019  . INFLUENZA VACCINE  01/25/2020  . HEMOGLOBIN A1C  05/02/2020  . OPHTHALMOLOGY EXAM  07/01/2020  . PNA vac Low Risk Adult  Completed   Fall Risk  10/02/2018 09/11/2018 08/28/2018 08/08/2018 08/07/2018  Falls in the past year? 0 0 0 0 0  Number falls in past yr: 0 0 0 - 0  Injury with Fall? 0 0 0 - 0  Risk for fall due to : - - - - -   Functional Status Survey:  Vitals:   04/14/20 0953  BP: (!) 123/59  Pulse: 76  Resp: 20  Temp: 97.9 F (36.6 C)  SpO2: 95%  Weight: 117 lb (53.1 kg)  Height: 3\' 8"  (1.118 m)   Body mass index is 42.49 kg/m. Physical Exam Vitals reviewed.  HENT:     Head: Normocephalic.     Nose: Nose normal.     Mouth/Throat:     Mouth: Mucous membranes are moist.     Pharynx: Oropharynx is clear.  Eyes:     Pupils: Pupils are equal, round, and reactive to light.  Cardiovascular:     Rate and Rhythm: Normal rate and regular rhythm.     Pulses: Normal pulses.  Pulmonary:     Effort: Pulmonary effort is normal.     Breath sounds: Normal breath sounds.  Abdominal:     General: Abdomen is flat. Bowel sounds are normal.     Palpations: Abdomen is soft.  Genitourinary:    Comments: Has External Hemorrhoids but not tender or swollen Musculoskeletal:     Cervical back: Neck supple.     Comments: Bilateral AKA  Skin:    General: Skin is warm.  Neurological:     Mental Status: He is alert and oriented to person, place, and  time.  Psychiatric:        Mood and Affect: Mood normal.     Labs reviewed: Recent Labs    05/19/19 0000 10/31/19 0000  NA 137 138  K 4.5 4.5  CL 100 102  CO2 27* 29*  BUN 31* 21  CREATININE 1.1 1.2  CALCIUM 9.4 9.1  9.1   Recent Labs    05/19/19 0000 10/31/19 0000  AST 15 14  ALT 16 11  ALKPHOS 68 62  ALBUMIN 3.3* 3.4*   Recent Labs    05/19/19 0000 10/31/19 0000  WBC 6.5 7.8  NEUTROABS 3,510  --   HGB 11.9* 13.1*  HCT 35* 39*  PLT 369 377   Lab Results  Component Value Date   TSH 1.72 10/31/2019   Lab Results  Component Value Date   HGBA1C 7.7 10/31/2019   Lab Results  Component Value Date   CHOL 132 10/31/2019   HDL 42 10/31/2019   LDLCALC 73 10/31/2019   TRIG 88 10/31/2019   CHOLHDL 2.6 08/29/2018    Significant Diagnostic Results in last 30 days:  No results found.  Assessment/Plan  BRPR ? internal hemorrhoids Most likely cause Do not have any Colonoscopy Report but patient says he has h/o Bleeding off and on Will try Anusol Suppositories for 5 days  Paroxysmal atrial fibrillation (HCC) On Eliquis and Metoprolol Controlled type 2 diabetes mellitus with stage 3 chronic kidney disease, without long-term current use of insulin (HCC) On Metformin Constipation, unspecified constipation type Continue Senna for now S/P AKA (above knee amputation) bilateral (HCC) Doing well in his wheelchair Hyperlipidemia, unspecified hyperlipidemia type On Zocor Stage 3b chronic kidney disease Bun and Creat stable Neuropathic pain On Low dose of Neurontin  Hyponatremia Soidum is stable  Anemia Hgb is 13.1 Not on iron anymore H/o CVA Continue Eliquis and statin Family/ staff Communication:   Labs/tests ordered:  CBC,CMP,A1C and Lipid Panel

## 2020-04-15 DIAGNOSIS — R799 Abnormal finding of blood chemistry, unspecified: Secondary | ICD-10-CM | POA: Diagnosis not present

## 2020-04-15 DIAGNOSIS — R7309 Other abnormal glucose: Secondary | ICD-10-CM | POA: Diagnosis not present

## 2020-04-15 DIAGNOSIS — Z13220 Encounter for screening for lipoid disorders: Secondary | ICD-10-CM | POA: Diagnosis not present

## 2020-04-16 LAB — HEPATIC FUNCTION PANEL
ALT: 12 (ref 10–40)
AST: 14 (ref 14–40)
Alkaline Phosphatase: 65 (ref 25–125)
Bilirubin, Total: 0.6

## 2020-04-16 LAB — LIPID PANEL
Cholesterol: 160 (ref 0–200)
HDL: 53 (ref 35–70)
LDL Cholesterol: 88
Triglycerides: 99 (ref 40–160)

## 2020-04-16 LAB — BASIC METABOLIC PANEL
BUN: 20 (ref 4–21)
CO2: 31 — AB (ref 13–22)
Chloride: 97 — AB (ref 99–108)
Creatinine: 1.2 (ref 0.6–1.3)
Glucose: 137
Potassium: 4.5 (ref 3.4–5.3)
Sodium: 136 — AB (ref 137–147)

## 2020-04-16 LAB — COMPREHENSIVE METABOLIC PANEL
Albumin: 3.6 (ref 3.5–5.0)
Calcium: 9.5 (ref 8.7–10.7)
Globulin: 2.9

## 2020-04-16 LAB — CBC: RBC: 3.86 — AB (ref 3.87–5.11)

## 2020-04-16 LAB — CBC AND DIFFERENTIAL
HCT: 39 — AB (ref 41–53)
Hemoglobin: 13 — AB (ref 13.5–17.5)
Neutrophils Absolute: 4688
Platelets: 415 — AB (ref 150–399)
WBC: 7.5

## 2020-04-16 LAB — HEMOGLOBIN A1C: Hemoglobin A1C: 7.6

## 2020-04-22 IMAGING — CT CT HEAD CODE STROKE
3 series · 15 of 47 positions shown, 18 images · non-contrast
Comparison: CT head without contrast 10/04/2017

CLINICAL DATA: Code stroke. Code stroke. Difficulty swallowing.
Left-sided weakness. Last seen normal 1 hour ago.

EXAM:
CT HEAD WITHOUT CONTRAST
TECHNIQUE: Contiguous axial images were obtained from the base of the skull
through the vertex without intravenous contrast.

[Series 3: head 5.0 st · axial · 0.46mm/px · z∈[-80,+55]mm · 9 of 33 slices shown, 12 images]
[im 3/33  brain]
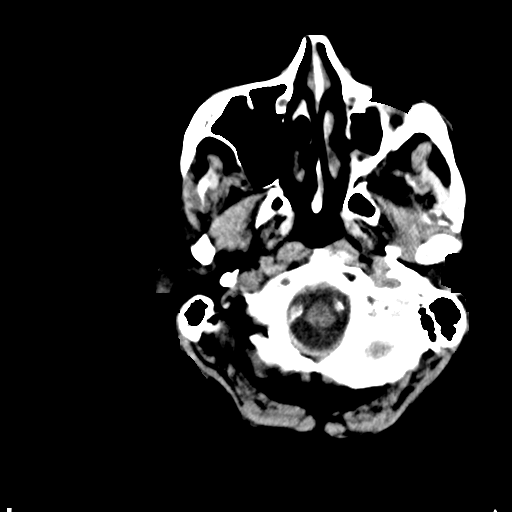
[im 3/33  bone]
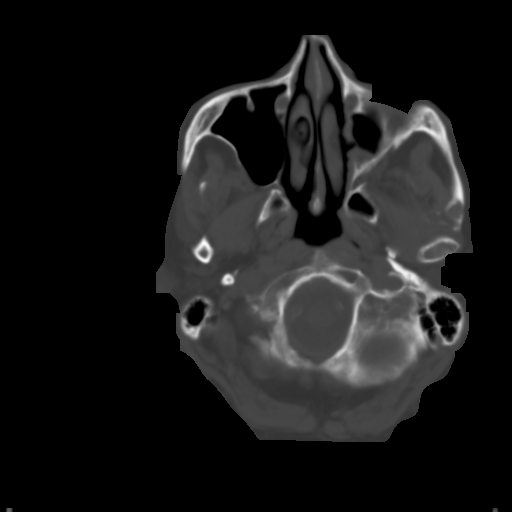
[im 6/33  brain]
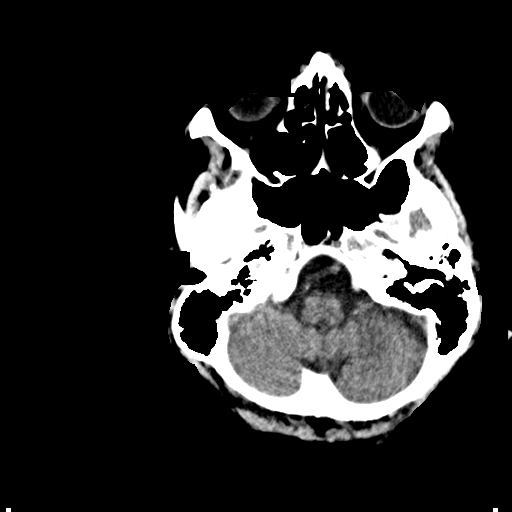
[im 9/33  brain]
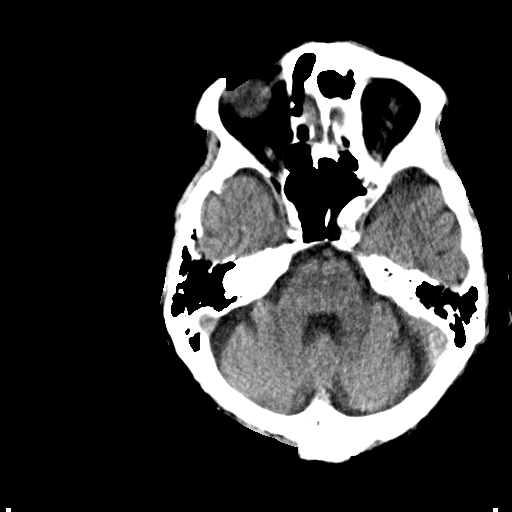
[im 13/33  brain]
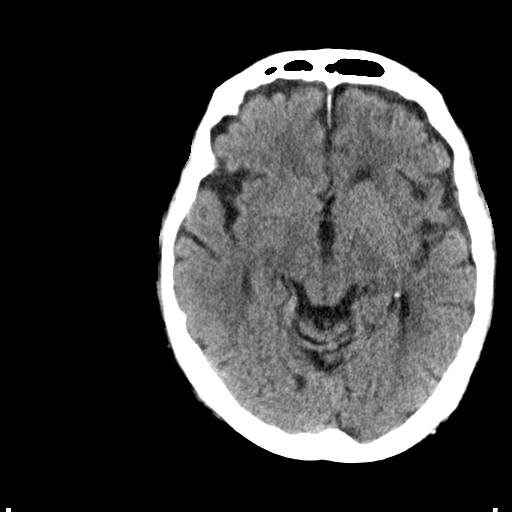
[im 17/33  brain]
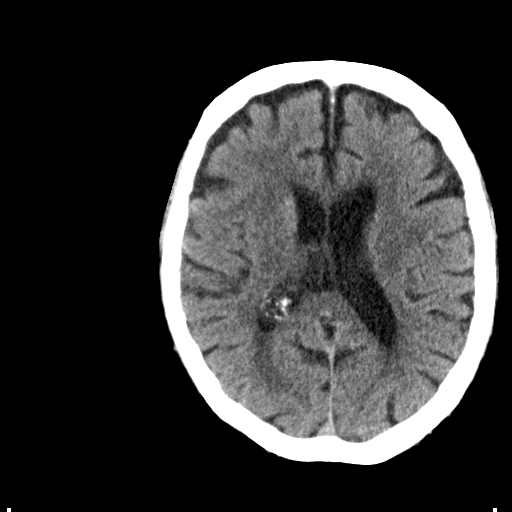
[im 17/33  bone]
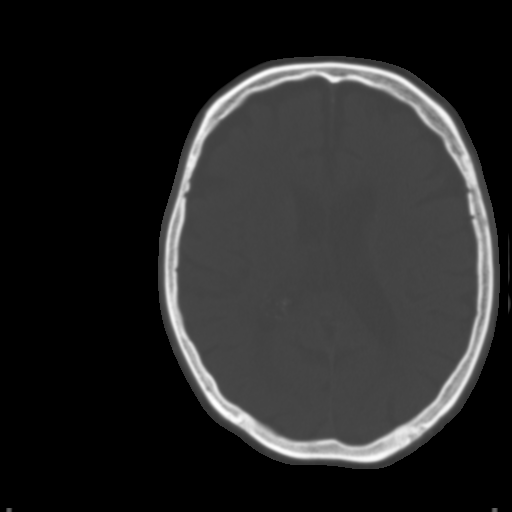
[im 20/33  brain]
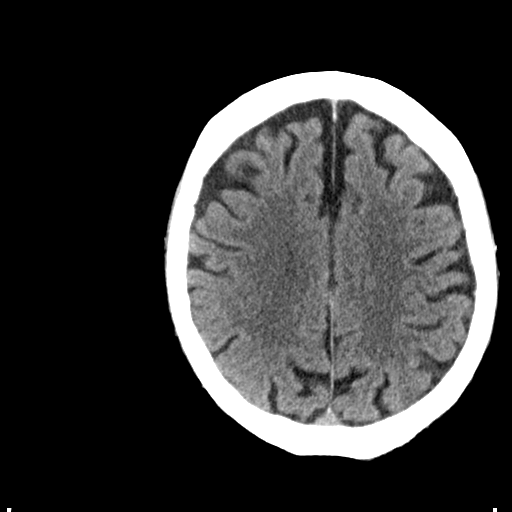
[im 24/33  brain]
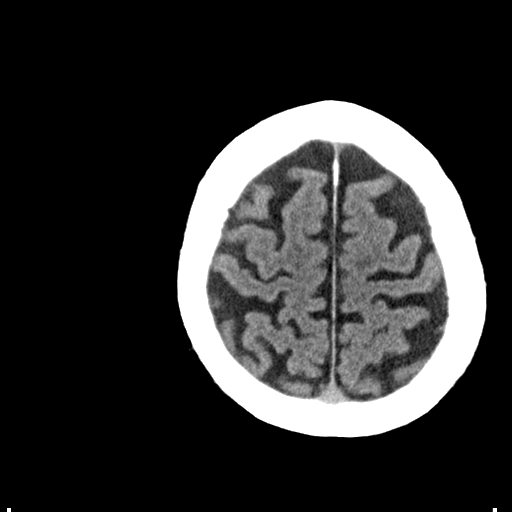
[im 27/33  brain]
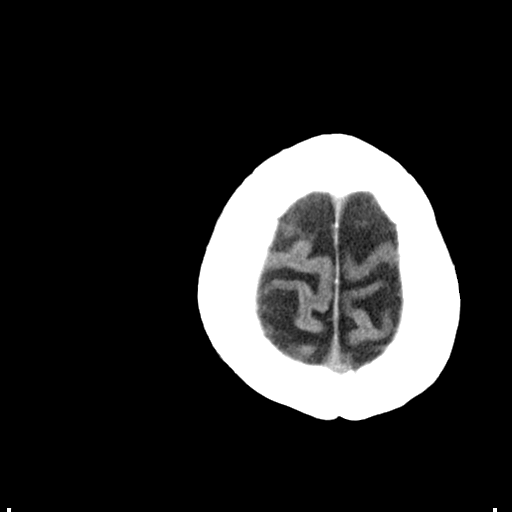
[im 30/33  brain]
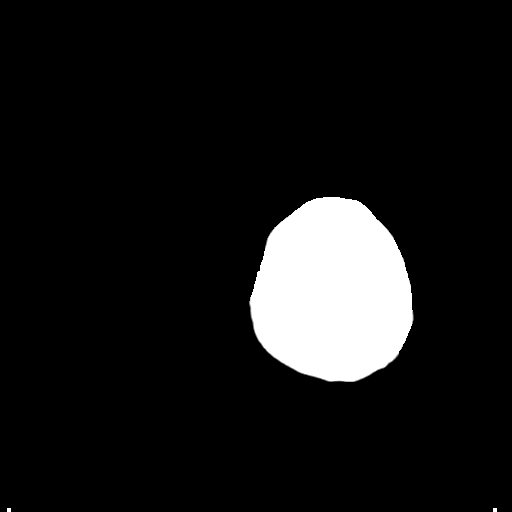
[im 30/33  bone]
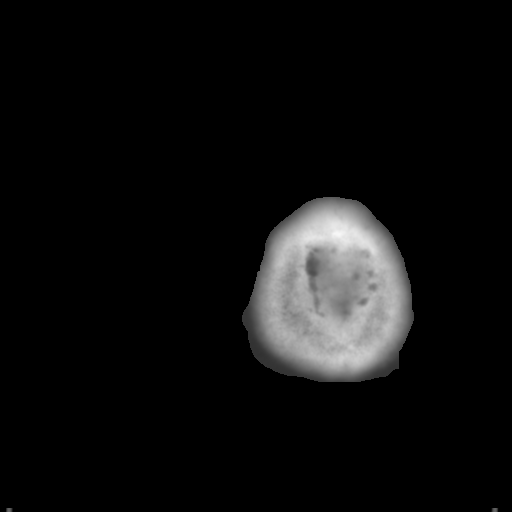

[Series 5: head 3.0 cor st · coronal · 0.31mm/px · 3 of 66 slices shown]
[im 22/66  brain]
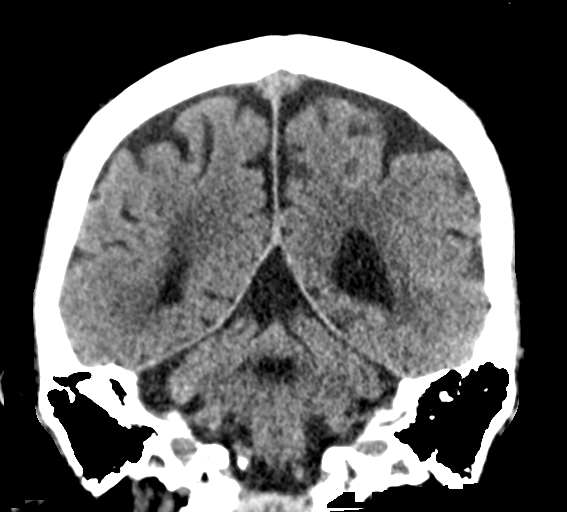
[im 29/66  brain]
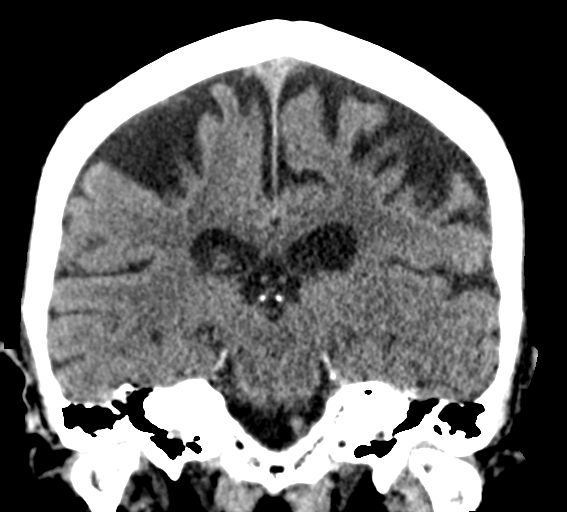
[im 37/66  brain]
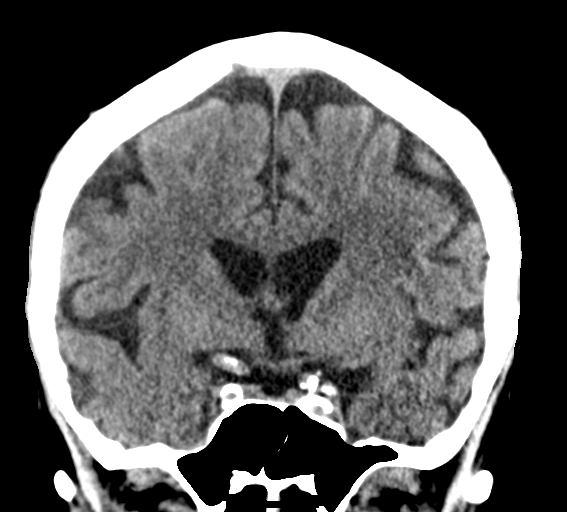

[Series 6: head 3.0 sag st · sagittal · 0.31mm/px · 3 of 57 slices shown]
[im 19/57  brain]
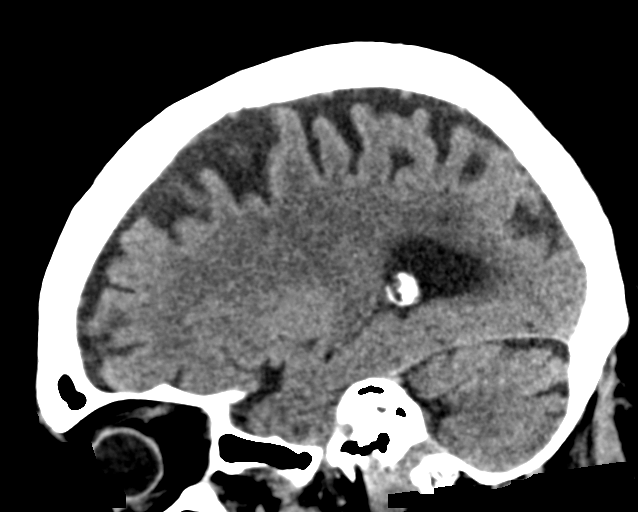
[im 29/57  brain]
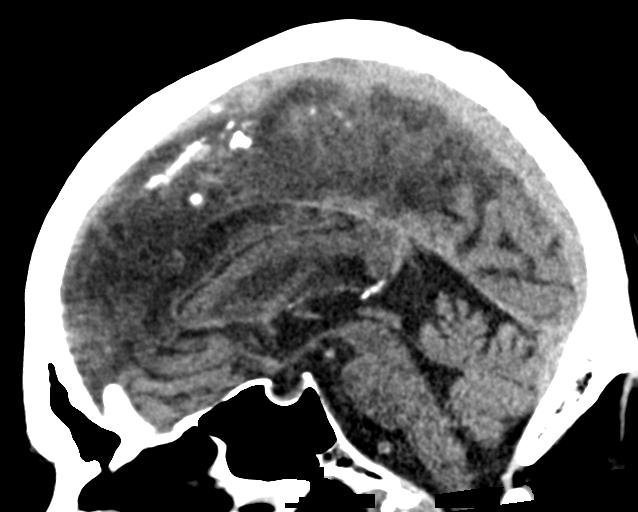
[im 38/57  brain]
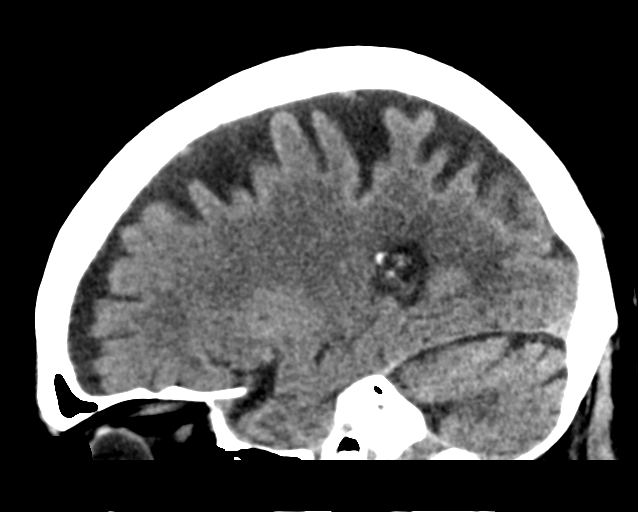

[15 of 47 positions shown; findings below may reference images not displayed]

FINDINGS: Brain: Atrophy and white matter changes are within normal limits for
age. Basal ganglia are intact. No acute infarct, hemorrhage, or mass
lesion is present. Insular cortex is within normal limits. No acute
or focal cortical abnormality is present ventricles are
proportionate to the degree of atrophy. No significant extra-axial
fluid collection is present. The brainstem and cerebellum are
normal.

Vascular: Atherosclerotic calcifications are present the cavernous
internal carotid arteries bilaterally and at the dural margin of
both vertebral arteries. There is no hyperdense vessel.

Skull: Calvarium is intact. No focal lytic or blastic lesions are
present.

Sinuses/Orbits: Chronic left maxillary sinus disease present. The
paranasal sinuses and mastoid air cells are otherwise clear. Globes
and orbits are within normal limits bilaterally.

ASPECTS ([place] Stroke Program Early CT Score)

- Ganglionic level infarction (caudate, lentiform nuclei, internal
capsule, insula, M1-M3 cortex): [DATE]

- Supraganglionic infarction (M4-M6 cortex): [DATE]

Total score (0-10 with 10 being normal): [DATE]
IMPRESSION: 1. Stable atrophy and white matter disease, likely within normal
limits for age.
2. No acute intracranial abnormality.
3. Atherosclerosis.
4. ASPECTS is [DATE]

The above was relayed via text pager to Dr. STPIERRE on 04/01/2018 at [DATE]
.

## 2020-04-29 DIAGNOSIS — Z85828 Personal history of other malignant neoplasm of skin: Secondary | ICD-10-CM | POA: Diagnosis not present

## 2020-04-29 DIAGNOSIS — L820 Inflamed seborrheic keratosis: Secondary | ICD-10-CM | POA: Diagnosis not present

## 2020-04-29 DIAGNOSIS — L570 Actinic keratosis: Secondary | ICD-10-CM | POA: Diagnosis not present

## 2020-04-29 DIAGNOSIS — L821 Other seborrheic keratosis: Secondary | ICD-10-CM | POA: Diagnosis not present

## 2020-04-29 DIAGNOSIS — L720 Epidermal cyst: Secondary | ICD-10-CM | POA: Diagnosis not present

## 2020-04-29 DIAGNOSIS — L814 Other melanin hyperpigmentation: Secondary | ICD-10-CM | POA: Diagnosis not present

## 2020-05-06 ENCOUNTER — Encounter: Payer: Self-pay | Admitting: Internal Medicine

## 2020-05-06 ENCOUNTER — Non-Acute Institutional Stay (SKILLED_NURSING_FACILITY): Payer: PPO | Admitting: Internal Medicine

## 2020-05-06 DIAGNOSIS — I480 Paroxysmal atrial fibrillation: Secondary | ICD-10-CM

## 2020-05-06 DIAGNOSIS — Z89612 Acquired absence of left leg above knee: Secondary | ICD-10-CM | POA: Diagnosis not present

## 2020-05-06 DIAGNOSIS — E785 Hyperlipidemia, unspecified: Secondary | ICD-10-CM

## 2020-05-06 DIAGNOSIS — M792 Neuralgia and neuritis, unspecified: Secondary | ICD-10-CM | POA: Diagnosis not present

## 2020-05-06 DIAGNOSIS — N183 Chronic kidney disease, stage 3 unspecified: Secondary | ICD-10-CM | POA: Diagnosis not present

## 2020-05-06 DIAGNOSIS — Z89611 Acquired absence of right leg above knee: Secondary | ICD-10-CM | POA: Diagnosis not present

## 2020-05-06 DIAGNOSIS — E1122 Type 2 diabetes mellitus with diabetic chronic kidney disease: Secondary | ICD-10-CM

## 2020-05-06 NOTE — Progress Notes (Signed)
Location:   [facility] Room Number: 21 Place of Service:  SNF [phone]) Provider: Veleta Miners, MD    Patient Care Team: Virgie Dad, MD as PCP - General (Internal Medicine) Buford Dresser, MD as PCP - Cardiology (Cardiology) Ricard Dillon, MD as Consulting Physician (Internal Medicine)  Extended Emergency Contact Information Primary Emergency Contact: Gordon,[place] Address: [address]          [place], [place] [address] [name] of [place] Phone: [phone] Mobile Phone: [phone] Relation: Daughter  Code Status: DNR  Goals of care: Advanced Directive information Advanced Directives 05/06/2020  Does Patient Have a Medical Advance Directive? Yes  Type of Advance Directive Living will;Healthcare Power of [place];Out of facility DNR (pink MOST or yellow form)  Does patient want to make changes to medical advance directive? No - Patient declined  Copy of [place] in Chart? Yes - validated most recent copy scanned in chart (See row information)  Pre-existing out of facility DNR order (yellow form or pink MOST form) -     Chief Complaint  Patient presents with  . Medical Management of Chronic Issues    Routine Visit.   Marland Kitchen Health Maintenance    Discuss the need for , Urine Microalbumin, and Hemoglobin A1C.    HPI:  Pt is a 86 y.o. male seen today for medical management of chronic diseases.    Long term care resident  Patient has h/o Hypertension, Hyperlipidemia, Diabetes mellitus, h/o Brain stem Infarct s/p TPA In 10/19,Hyponatremia,Atrial Fibrillation on Eliquis since 10/19.,PADs/pBilateralAKA with first Left AKA and thenRight AKA  Doing well Uses Wheelchair to move around in the facility No Pain Weight stable  Past Medical History:  Diagnosis Date  . BPH (benign prostatic hyperplasia) 10/14/2014  . Cervicalgia 01/04/2016  . Chronic kidney disease   . Contusion of left shoulder 05/27/14  .  DM type 2 (diabetes mellitus, type 2) ([facility])   . Dysphagia   . High blood pressure   . History of basal cell cancer 11/21/2005   Right temple  . History of colonic polyps 10/14/2003  . Hyperlipidemia   . Left knee pain 05/27/14  . Peripheral neuropathic pain   . Peripheral vascular disease ([facility])   . SAH (subarachnoid hemorrhage) ([facility]) 2009   TBI  . Stroke ([facility])   . TIA (transient ischemic attack) 11/06/2005   Left eye pain. Slurred speech. Diaphoresis. No residual effects.    Past Surgical History:  Procedure Laterality Date  . ABDOMINAL AORTOGRAM W/LOWER EXTREMITY N/A 06/12/2018   Procedure: ABDOMINAL AORTOGRAM W/LOWER EXTREMITY;  Surgeon: Marty Heck, MD;  Location: [facility];  Service: Cardiovascular;  Laterality: N/A;  . Achilles tendon repair Left 1998   ruptured tendon  . AMPUTATION Left 10/11/2018   Procedure: Left  AMPUTATION ABOVE KNEE;  Surgeon: Marty Heck, MD;  Location: [facility];  Service: Vascular;  Laterality: Left;  . AMPUTATION Right 02/07/2019   Procedure: AMPUTATION ABOVE KNEE;  Surgeon: Rosetta Posner, MD;  Location: [facility];  Service: Vascular;  Laterality: Right;  . COLONOSCOPY  1992   Polyp removed  . COLONOSCOPY  1998   normal  . ORIF FEMUR FRACTURE Right 1929  . PERIPHERAL VASCULAR INTERVENTION Left 06/12/2018   Procedure: PERIPHERAL VASCULAR INTERVENTION;  Surgeon: Marty Heck, MD;  Location: [facility];  Service: Cardiovascular;  Laterality: Left;  Attempted   . TONSILLECTOMY      Allergies  Allergen Reactions  .  Tape Other (See Comments)    TAPE WILL TEAR THE SKIN!!!!    Allergies as of 05/06/2020      Reactions   Tape Other (See Comments)   TAPE WILL TEAR THE SKIN!!!!      Medication List       Accurate as of May 06, 2020 11:59 PM. If you have any questions, ask your nurse or doctor.        Acetaminophen 8 Hour 650 MG CR tablet Generic drug: acetaminophen Take 650 mg by mouth every 8 (eight)  hours as needed for pain.   bisacodyl 10 MG suppository Commonly known as: DULCOLAX Place 10 mg rectally daily as needed for moderate constipation.   Calcium Carbonate-Vitamin D 600-400 MG-UNIT tablet Take 1 tablet by mouth daily. Morning: 7am-11am.   D3-1000 25 MCG (1000 UT) tablet Generic drug: Cholecalciferol Take 1,000 Units by mouth daily. Morning 7am-11am.   Eliquis 2.5 MG Tabs tablet Generic drug: apixaban Take 2.5 mg by mouth 2 (two) times daily. Morning: 7am-11am Evening: 7:30pm-11pm   gabapentin 100 MG capsule Commonly known as: NEURONTIN Take 100 mg by mouth at bedtime. 7:30pm-11pm.   metFORMIN 500 MG tablet Commonly known as: GLUCOPHAGE Take 500 mg by mouth 2 (two) times daily with a meal. Morning: 7am-9am Evening: 5pm-7pm.   metoprolol tartrate 25 MG tablet Commonly known as: LOPRESSOR Take 25 mg by mouth 2 (two) times daily. Morning: 7am-11am Evening: 7pm-11pm   Propylene Glycol-Glycerin 1-0.3 % Soln Place 2 drops into both eyes daily as needed (dry eyes).   sennosides-docusate sodium 8.6-50 MG tablet Commonly known as: SENOKOT-S Take 2 tablets by mouth at bedtime as needed.   simvastatin 10 MG tablet Commonly known as: ZOCOR Take 10 mg by mouth daily. Between the hours of 5pm-7pm.   vitamin B-12 500 MCG tablet Commonly known as: CYANOCOBALAMIN Take 500 mcg by mouth daily.   zinc oxide 20 % ointment Apply 1 application topically as needed for irritation. To buttocks after every incontinent episode and as needed for redness. May keep at bedside. As Needed       Review of Systems  Review of Systems  Constitutional: Negative for activity change, appetite change, chills, diaphoresis, fatigue and fever.  HENT: Negative for mouth sores, postnasal drip, rhinorrhea, sinus pain and sore throat.   Respiratory: Negative for apnea, cough, chest tightness, shortness of breath and wheezing.   Cardiovascular: Negative for chest pain, palpitations and leg  swelling.  Gastrointestinal: Negative for abdominal distention, abdominal pain, constipation, diarrhea, nausea and vomiting.  Genitourinary: Negative for dysuria and frequency.  Musculoskeletal: Negative for arthralgias, joint swelling and myalgias.  Skin: Negative for rash.  Neurological: Negative for dizziness, syncope, weakness, light-headedness and numbness.  Psychiatric/Behavioral: Negative for behavioral problems, confusion and sleep disturbance.     Immunization History  Administered Date(s) Administered  . DTaP 12/31/2012  . Influenza, High Dose Seasonal PF 04/04/2017, 04/10/2019  . Influenza,inj,Quad PF,6+ Mos 03/28/2018  . Influenza-Unspecified 04/10/2014, 03/25/2015, 04/06/2016, 03/30/2020  . Moderna SARS-COVID-2 Vaccination 06/30/2019, 07/28/2019  . PPD Test 04/29/2014  . Pneumococcal Conjugate-13 04/28/2017  . Pneumococcal-Unspecified 03/13/2011  . Td 12/24/2012  . Tdap 04/28/2017  . Zoster Recombinat (Shingrix) 12/14/2017   Pertinent  Health Maintenance Due  Topic Date Due  . FOOT EXAM  06/27/2018  . URINE MICROALBUMIN  03/14/2019  . OPHTHALMOLOGY EXAM  07/01/2020  . HEMOGLOBIN A1C  10/15/2020  . INFLUENZA VACCINE  Completed  . PNA vac Low Risk Adult  Completed   Fall Risk  10/02/2018 09/11/2018 08/28/2018 08/08/2018 08/07/2018  Falls in the past year? 0 0 0 0 0  Number falls in past yr: 0 0 0 - 0  Injury with Fall? 0 0 0 - 0  Risk for fall due to : - - - - -   Functional Status Survey:    Vitals:   05/06/20 1546  BP: (!) 105/58  Pulse: 70  Resp: 19  Temp: (!) 97.4 F (36.3 C)  SpO2: 90%  Weight: 117 lb 6.4 oz (53.3 kg)  Height: 3\' 8"  (1.118 m)   Body mass index is 42.63 kg/m. Physical Exam  Constitutional: Oriented to person, place, and time. Well-developed and well-nourished.  HENT:  Head: Normocephalic.  Mouth/Throat: Oropharynx is clear and moist.  Eyes: Pupils are equal, round, and reactive to light.  Neck: Neck supple.  Cardiovascular: Normal  rate and normal heart sounds.  Murmur Present Pulmonary/Chest: Effort normal and breath sounds normal. No respiratory distress. No wheezes. She has no rales.  Abdominal: Soft. Bowel sounds are normal. No distension. There is no tenderness. There is no rebound.  Musculoskeletal: No edema.   S/P Bilateral AKA Healed Lymphadenopathy: none Neurological: Alert and oriented to person, place, and time.  Skin: Skin is warm and dry.  Psychiatric: Normal mood and affect. Behavior is normal. Thought content normal.    Labs reviewed: Recent Labs    05/19/19 0000 10/31/19 0000 04/16/20 0000  NA 137 138 136*  K 4.5 4.5 4.5  CL 100 102 97*  CO2 27* 29* 31*  BUN 31* 21 20  CREATININE 1.1 1.2 1.2  CALCIUM 9.4 9.1  9.1 9.5   Recent Labs    05/19/19 0000 10/31/19 0000 04/16/20 0000  AST 15 14 14   ALT 16 11 12   ALKPHOS 68 62 65  ALBUMIN 3.3* 3.4* 3.6   Recent Labs    05/19/19 0000 10/31/19 0000 04/16/20 0000  WBC 6.5 7.8 7.5  NEUTROABS 3,510  --  4,688.00  HGB 11.9* 13.1* 13.0*  HCT 35* 39* 39*  PLT 369 377 415*   Lab Results  Component Value Date   TSH 1.72 10/31/2019   Lab Results  Component Value Date   HGBA1C 7.6 04/16/2020   Lab Results  Component Value Date   CHOL 160 04/16/2020   HDL 53 04/16/2020   LDLCALC 88 04/16/2020   TRIG 99 04/16/2020   CHOLHDL 2.6 08/29/2018    Significant Diagnostic Results in last 30 days:  No results found.  Assessment/Plan Paroxysmal atrial fibrillation (HCC) On Eliquis and Lopressor  Controlled type 2 diabetes mellitus with stage 3 chronic kidney disease, without long-term current use of insulin (HCC) A1C in good level On Metformin S/P AKA (above knee amputation) bilateral (HCC) Doing well Hyperlipidemia,  LDL less then 100 on Zocor Neuropathic pain Continue Neurontin H/o CVA Continue Eliquis and statin Hyponatremia Sodium stable  Family/ staff Communication:   Labs/tests ordered:

## 2020-05-27 ENCOUNTER — Encounter: Payer: Self-pay | Admitting: Internal Medicine

## 2020-05-27 ENCOUNTER — Non-Acute Institutional Stay (SKILLED_NURSING_FACILITY): Payer: PPO | Admitting: Internal Medicine

## 2020-05-27 DIAGNOSIS — Z89612 Acquired absence of left leg above knee: Secondary | ICD-10-CM | POA: Diagnosis not present

## 2020-05-27 DIAGNOSIS — N183 Chronic kidney disease, stage 3 unspecified: Secondary | ICD-10-CM

## 2020-05-27 DIAGNOSIS — R079 Chest pain, unspecified: Secondary | ICD-10-CM | POA: Diagnosis not present

## 2020-05-27 DIAGNOSIS — Z89611 Acquired absence of right leg above knee: Secondary | ICD-10-CM

## 2020-05-27 DIAGNOSIS — I480 Paroxysmal atrial fibrillation: Secondary | ICD-10-CM | POA: Diagnosis not present

## 2020-05-27 DIAGNOSIS — M792 Neuralgia and neuritis, unspecified: Secondary | ICD-10-CM | POA: Diagnosis not present

## 2020-05-27 DIAGNOSIS — E1122 Type 2 diabetes mellitus with diabetic chronic kidney disease: Secondary | ICD-10-CM

## 2020-05-27 DIAGNOSIS — E785 Hyperlipidemia, unspecified: Secondary | ICD-10-CM | POA: Diagnosis not present

## 2020-05-27 NOTE — Progress Notes (Signed)
Location: [facility] Room Number: 21 Place of Service:  SNF (31)  Provider:   Code Status:  Goals of Care:  Advanced Directives 05/27/2020  Does Patient Have a Medical Advance Directive? Yes  Type of Paramedic of [place];Living will;Out of facility DNR (pink MOST or yellow form)  Does patient want to make changes to medical advance directive? No - Patient declined  Copy of [place] in Chart? Yes - validated most recent copy scanned in chart (See row information)  Pre-existing out of facility DNR order (yellow form or pink MOST form) Yellow form placed in chart (order not valid for inpatient use)     Chief Complaint  Patient presents with  . Acute Visit    Chest pain    HPI: Patient is a 86 y.o. male seen today for an acute visit for Chest pain  Long term care resident  Patient has h/o Hypertension, Hyperlipidemia, Diabetes mellitus, h/o Brain stem Infarct s/p TPA In 10/19,Hyponatremia,Atrial Fibrillation on Eliquis since 10/19.,PADs/pBilateralAKA with first Left AKA and thenRight AKA  He was getting ready to go for lunch when he c/o Left Sided Chest pain with Weakness. Denied any SOB or diaphoretic. No Dizziness.  Was put back in the bed and given one s/l Nitro. He responded to it. And pain resolved But weakness continued. He refused to go to ED But said he will go if pain comes back  Past Medical History:  Diagnosis Date  . BPH (benign prostatic hyperplasia) 10/14/2014  . Cervicalgia 01/04/2016  . Chronic kidney disease   . Contusion of left shoulder 05/27/14  . DM type 2 (diabetes mellitus, type 2) ([facility])   . Dysphagia   . High blood pressure   . History of basal cell cancer 11/21/2005   Right temple  . History of colonic polyps 10/14/2003  . Hyperlipidemia   . Left knee pain 05/27/14  . Peripheral neuropathic pain   . Peripheral vascular disease ([facility])   . SAH (subarachnoid hemorrhage) ([facility])  2009   TBI  . Stroke ([facility])   . TIA (transient ischemic attack) 11/06/2005   Left eye pain. Slurred speech. Diaphoresis. No residual effects.     Past Surgical History:  Procedure Laterality Date  . ABDOMINAL AORTOGRAM W/LOWER EXTREMITY N/A 06/12/2018   Procedure: ABDOMINAL AORTOGRAM W/LOWER EXTREMITY;  Surgeon: Marty Heck, MD;  Location: [facility];  Service: Cardiovascular;  Laterality: N/A;  . Achilles tendon repair Left 1998   ruptured tendon  . AMPUTATION Left 10/11/2018   Procedure: Left  AMPUTATION ABOVE KNEE;  Surgeon: Marty Heck, MD;  Location: [facility];  Service: Vascular;  Laterality: Left;  . AMPUTATION Right 02/07/2019   Procedure: AMPUTATION ABOVE KNEE;  Surgeon: Rosetta Posner, MD;  Location: [facility];  Service: Vascular;  Laterality: Right;  . COLONOSCOPY  1992   Polyp removed  . COLONOSCOPY  1998   normal  . ORIF FEMUR FRACTURE Right 1929  . PERIPHERAL VASCULAR INTERVENTION Left 06/12/2018   Procedure: PERIPHERAL VASCULAR INTERVENTION;  Surgeon: Marty Heck, MD;  Location: [facility];  Service: Cardiovascular;  Laterality: Left;  Attempted   . TONSILLECTOMY      Allergies  Allergen Reactions  . Tape Other (See Comments)    TAPE WILL TEAR THE SKIN!!!!    Outpatient Encounter Medications as of 05/27/2020  Medication Sig  . acetaminophen (ACETAMINOPHEN 8 HOUR) 650 MG CR tablet Take 650 mg by mouth  every 8 (eight) hours as needed for pain.  Marland Kitchen apixaban (ELIQUIS) 2.5 MG TABS tablet Take 2.5 mg by mouth 2 (two) times daily. Morning: 7am-11am Evening: 7:30pm-11pm  . bisacodyl (DULCOLAX) 10 MG suppository Place 10 mg rectally daily as needed for moderate constipation.  . Calcium Carbonate-Vitamin D 600-400 MG-UNIT tablet Take 1 tablet by mouth daily. Morning: 7am-11am.  . Cholecalciferol (D3-1000) 25 MCG (1000 UT) tablet Take 1,000 Units by mouth daily. Morning 7am-11am.  . gabapentin (NEURONTIN) 100 MG capsule Take 100 mg by mouth at  bedtime. 7:30pm-11pm.  . metFORMIN (GLUCOPHAGE) 500 MG tablet Take 500 mg by mouth 2 (two) times daily with a meal. Morning: 7am-9am Evening: 5pm-7pm.  . metoprolol tartrate (LOPRESSOR) 25 MG tablet Take 25 mg by mouth 2 (two) times daily. Morning: 7am-11am Evening: 7pm-11pm  . Propylene Glycol-Glycerin 1-0.3 % SOLN Place 2 drops into both eyes daily as needed (dry eyes).   . sennosides-docusate sodium (SENOKOT-S) 8.6-50 MG tablet Take 2 tablets by mouth at bedtime as needed.   . simvastatin (ZOCOR) 10 MG tablet Take 10 mg by mouth daily. Between the hours of 5pm-7pm.  . vitamin B-12 (CYANOCOBALAMIN) 500 MCG tablet Take 500 mcg by mouth daily.  Marland Kitchen zinc oxide 20 % ointment Apply 1 application topically as needed for irritation. To buttocks after every incontinent episode and as needed for redness. May keep at bedside. As Needed   No facility-administered encounter medications on file as of 05/27/2020.    Review of Systems:  Review of Systems  Constitutional: Positive for activity change.  HENT: Negative.   Respiratory: Negative.   Cardiovascular: Positive for chest pain.  Gastrointestinal: Negative.   Genitourinary: Negative.   Musculoskeletal: Negative.   Skin: Negative.   Neurological: Positive for weakness. Negative for dizziness.  Psychiatric/Behavioral: Negative.     Health Maintenance  Topic Date Due  . FOOT EXAM  06/27/2018  . URINE MICROALBUMIN  03/14/2019  . OPHTHALMOLOGY EXAM  07/01/2020  . HEMOGLOBIN A1C  10/15/2020  . TETANUS/TDAP  04/29/2027  . INFLUENZA VACCINE  Completed  . COVID-19 Vaccine  Completed  . PNA vac Low Risk Adult  Completed    Physical Exam: Vitals:   05/27/20 1622  BP: (!) 103/55  Pulse: 65  Resp: 20  Temp: 98.3 F (36.8 C)  SpO2: 93%  Weight: 115 lb 6.4 oz (52.3 kg)  Height: 3\' 8"  (1.118 m)   Body mass index is 41.91 kg/m. Physical Exam  Constitutional: Oriented to person, place, and time. Well-developed and well-nourished.  HENT:    Head: Normocephalic.  Mouth/Throat: Oropharynx is clear and moist.  Eyes: Pupils are equal, round, and reactive to light.  Neck: Neck supple.  Cardiovascular: Normal rate and normal heart sounds. Irregular Rythm Murmur Present Pulmonary/Chest: Effort normal and breath sounds normal. No respiratory distress. No wheezes. She has no rales.  Abdominal: Soft. Bowel sounds are normal. No distension. There is no tenderness. There is no rebound.  Musculoskeletal: S/P Bilateral AKA Lymphadenopathy: none Neurological: Alert and oriented to person, place, and time.  Skin: Skin is warm and dry.  Psychiatric: Normal mood and affect. Behavior is normal. Thought content normal.    Labs reviewed: Basic Metabolic Panel: Recent Labs    10/31/19 0000 04/16/20 0000  NA 138 136*  K 4.5 4.5  CL 102 97*  CO2 29* 31*  BUN 21 20  CREATININE 1.2 1.2  CALCIUM 9.1  9.1 9.5  TSH 1.72  --    Liver Function Tests: Recent Labs  10/31/19 0000 04/16/20 0000  AST 14 14  ALT 11 12  ALKPHOS 62 65  ALBUMIN 3.4* 3.6   No results for input(s): LIPASE, AMYLASE in the last 8760 hours. No results for input(s): AMMONIA in the last 8760 hours. CBC: Recent Labs    10/31/19 0000 04/16/20 0000  WBC 7.8 7.5  NEUTROABS  --  4,688.00  HGB 13.1* 13.0*  HCT 39* 39*  PLT 377 415*   Lipid Panel: Recent Labs    10/31/19 0000 04/16/20 0000  CHOL 132 160  HDL 42 53  LDLCALC 73 88  TRIG 88 99   Lab Results  Component Value Date   HGBA1C 7.6 04/16/2020    Procedures since last visit: No results found.  Assessment/Plan Chest pain Has responded to S/L Nitro Patient intially refused to go to ED but says he will go if his Pain comes back. Will do EKG. Oxygen as needed  Paroxysmal atrial fibrillation (HCC) Running low BP Will change his Lopressor to 12.5 mg BID On Eliquis Controlled type 2 diabetes mellitus with stage 3 chronic kidney disease, without long-term current use of insulin (HCC) On  Metformin S/P AKA (above knee amputation) bilateral (HCC) Supportive care Hyperlipidemia, unspecified hyperlipidemia type On Statin Neuropathic pain Continue Neurontin    Labs/tests ordered:  * No order type specified * Next appt:  Visit date not found

## 2020-06-23 NOTE — Addendum Note (Signed)
Addended by: Antonetta Clanton X on: 06/23/2020 10:24 AM   Modules accepted: Level of Service

## 2020-06-29 ENCOUNTER — Encounter: Payer: Self-pay | Admitting: Nurse Practitioner

## 2020-06-29 ENCOUNTER — Non-Acute Institutional Stay (SKILLED_NURSING_FACILITY): Payer: PPO | Admitting: Nurse Practitioner

## 2020-06-29 DIAGNOSIS — I480 Paroxysmal atrial fibrillation: Secondary | ICD-10-CM

## 2020-06-29 DIAGNOSIS — E1122 Type 2 diabetes mellitus with diabetic chronic kidney disease: Secondary | ICD-10-CM | POA: Diagnosis not present

## 2020-06-29 DIAGNOSIS — M792 Neuralgia and neuritis, unspecified: Secondary | ICD-10-CM

## 2020-06-29 DIAGNOSIS — N183 Chronic kidney disease, stage 3 unspecified: Secondary | ICD-10-CM

## 2020-06-29 DIAGNOSIS — D500 Iron deficiency anemia secondary to blood loss (chronic): Secondary | ICD-10-CM

## 2020-06-29 DIAGNOSIS — K5900 Constipation, unspecified: Secondary | ICD-10-CM | POA: Diagnosis not present

## 2020-06-29 NOTE — Assessment & Plan Note (Signed)
Constipation, stable, on Senokot S II prn, prn Bisacodyl 10mg  suppository.

## 2020-06-29 NOTE — Assessment & Plan Note (Signed)
T2 DM, blood sugar is controlled, on Metformin 500mg  bid, Simvastatin 10mg  qd.05/19/19 urine micro albumin 69(30-299). Hgb a1c 7.6 04/16/20, Bun/creat 20/1.2 04/16/20

## 2020-06-29 NOTE — Assessment & Plan Note (Signed)
Peripheral neuropathy, phantom pain, stable, on Gabapentin 100mg  qd, Tylenol prn

## 2020-06-29 NOTE — Assessment & Plan Note (Signed)
Anemia, stable, on Fe, Vit B12. Hgb 13 04/16/20

## 2020-06-29 NOTE — Progress Notes (Signed)
Location:    [facility] Room Number: 21 Place of Service:  SNF ([phone]) Provider:  Marda Stalker, Lennie Odor NP  Virgie Dad, MD  Patient Care Team: Virgie Dad, MD as PCP - General (Internal Medicine) Buford Dresser, MD as PCP - Cardiology (Cardiology) Ricard Dillon, MD as Consulting Physician (Internal Medicine)  Extended Emergency Contact Information Primary Emergency Contact: Gordon,[place] Address: [address]          [place], [place] [address] [name] of [place] Phone: [phone] Mobile Phone: [phone] Relation: Daughter  Code Status:  DNR Goals of care: Advanced Directive information Advanced Directives 05/27/2020  Does Patient Have a Medical Advance Directive? Yes  Type of Paramedic of [place];Living will;Out of facility DNR (pink MOST or yellow form)  Does patient want to make changes to medical advance directive? No - Patient declined  Copy of [place] in Chart? Yes - validated most recent copy scanned in chart (See row information)  Pre-existing out of facility DNR order (yellow form or pink MOST form) Yellow form placed in chart (order not valid for inpatient use)     Chief Complaint  Patient presents with  . Medical Management of Chronic Issues    HPI:  Pt is a 87 y.o. male seen today for medical management of chronic diseases.    Anemia, stable, on Fe, Vit B12. Hgb 13 04/16/20 Constipation, stable, on Senokot S II prn, prn Bisacodyl 10mg  suppository. Peripheral neuropathy, phantom pain, stable, on Gabapentin 100mg  qd, Tylenol prn T2 DM, blood sugar is controlled, on Metformin 500mg  bid, Simvastatin 10mg  qd.05/19/19 urine micro albumin 69(30-299). Hgb a1c 7.6 04/16/20, Bun/creat 20/1.2 04/16/20             Afib, heart rate is in control, on Metoprolol 12.5mg  bid, Eliquis 2.5mg  bid. Last Cardiology 08/28/19.     Past Medical History:   Diagnosis Date  . BPH (benign prostatic hyperplasia) 10/14/2014  . Cervicalgia 01/04/2016  . Chronic kidney disease   . Contusion of left shoulder 05/27/14  . DM type 2 (diabetes mellitus, type 2) ([facility])   . Dysphagia   . High blood pressure   . History of basal cell cancer 11/21/2005   Right temple  . History of colonic polyps 10/14/2003  . Hyperlipidemia   . Left knee pain 05/27/14  . Peripheral neuropathic pain   . Peripheral vascular disease ([facility])   . SAH (subarachnoid hemorrhage) ([facility]) 2009   TBI  . Stroke ([facility])   . TIA (transient ischemic attack) 11/06/2005   Left eye pain. Slurred speech. Diaphoresis. No residual effects.    Past Surgical History:  Procedure Laterality Date  . ABDOMINAL AORTOGRAM W/LOWER EXTREMITY N/A 06/12/2018   Procedure: ABDOMINAL AORTOGRAM W/LOWER EXTREMITY;  Surgeon: Marty Heck, MD;  Location: [facility];  Service: Cardiovascular;  Laterality: N/A;  . Achilles tendon repair Left 1998   ruptured tendon  . AMPUTATION Left 10/11/2018   Procedure: Left  AMPUTATION ABOVE KNEE;  Surgeon: Marty Heck, MD;  Location: [facility];  Service: Vascular;  Laterality: Left;  . AMPUTATION Right 02/07/2019   Procedure: AMPUTATION ABOVE KNEE;  Surgeon: Rosetta Posner, MD;  Location: [facility];  Service: Vascular;  Laterality: Right;  . COLONOSCOPY  1992   Polyp removed  . COLONOSCOPY  1998   normal  . ORIF FEMUR FRACTURE Right 1929  . PERIPHERAL VASCULAR INTERVENTION Left 06/12/2018   Procedure: PERIPHERAL VASCULAR INTERVENTION;  Surgeon: Carlis Abbott,  Gwenyth Allegra, MD;  Location: [facility];  Service: Cardiovascular;  Laterality: Left;  Attempted   . TONSILLECTOMY      Allergies  Allergen Reactions  . Tape Other (See Comments)    TAPE WILL TEAR THE SKIN!!!!    Allergies as of 06/29/2020      Reactions   Tape Other (See Comments)   TAPE WILL TEAR THE SKIN!!!!      Medication List       Accurate as of June 29, 2020 11:59 PM. If you have  any questions, ask your nurse or doctor.        acetaminophen 650 MG CR tablet Commonly known as: TYLENOL Take 650 mg by mouth every 8 (eight) hours as needed for pain.   apixaban 2.5 MG Tabs tablet Commonly known as: ELIQUIS Take 2.5 mg by mouth 2 (two) times daily. Morning: 7am-11am Evening: 7:30pm-11pm   bisacodyl 10 MG suppository Commonly known as: DULCOLAX Place 10 mg rectally daily as needed for moderate constipation.   Calcium Carbonate-Vitamin D 600-400 MG-UNIT tablet Take 1 tablet by mouth daily. Morning: 7am-11am.   D3-1000 25 MCG (1000 UT) tablet Generic drug: Cholecalciferol Take 1,000 Units by mouth daily. Morning 7am-11am.   gabapentin 100 MG capsule Commonly known as: NEURONTIN Take 100 mg by mouth at bedtime. 7:30pm-11pm.   metFORMIN 500 MG tablet Commonly known as: GLUCOPHAGE Take 500 mg by mouth 2 (two) times daily with a meal. Morning: 7am-9am Evening: 5pm-7pm.   metoprolol tartrate 25 MG tablet Commonly known as: LOPRESSOR Take 12.5 mg by mouth 2 (two) times daily. Morning: 7am-11am Evening: 7pm-11pm   Propylene Glycol-Glycerin 1-0.3 % Soln Place 2 drops into both eyes daily as needed (dry eyes).   sennosides-docusate sodium 8.6-50 MG tablet Commonly known as: SENOKOT-S Take 2 tablets by mouth at bedtime as needed.   simvastatin 10 MG tablet Commonly known as: ZOCOR Take 10 mg by mouth daily. Between the hours of 5pm-7pm.   vitamin B-12 500 MCG tablet Commonly known as: CYANOCOBALAMIN Take 500 mcg by mouth daily.   zinc oxide 20 % ointment Apply 1 application topically as needed for irritation. To buttocks after every incontinent episode and as needed for redness. May keep at bedside. As Needed       Review of Systems  Constitutional: Negative for activity change, fever and unexpected weight change.  HENT: Positive for hearing loss and trouble swallowing. Negative for congestion.   Eyes: Negative for visual disturbance.   Respiratory: Positive for cough. Negative for chest tightness and shortness of breath.        Chronic cough with swallowing.   Cardiovascular: Negative for chest pain and leg swelling.  Gastrointestinal: Negative for abdominal pain and constipation.  Genitourinary: Negative for difficulty urinating, dysuria and urgency.  Musculoskeletal: Positive for gait problem.  Skin: Negative for color change.  Neurological: Negative for speech difficulty, weakness, light-headedness and headaches.  Psychiatric/Behavioral: Negative for confusion and sleep disturbance. The patient is not nervous/anxious.     Immunization History  Administered Date(s) Administered  . DTaP 12/31/2012  . Influenza, High Dose Seasonal PF 04/04/2017, 04/10/2019  . Influenza,inj,Quad PF,6+ Mos 03/28/2018  . Influenza-Unspecified 04/10/2014, 03/25/2015, 04/06/2016, 03/30/2020  . Moderna Sars-Covid-2 Vaccination 06/30/2019, 07/28/2019  . PPD Test 04/29/2014  . Pneumococcal Conjugate-13 04/28/2017  . Pneumococcal-Unspecified 03/13/2011  . Td 12/24/2012  . Tdap 04/28/2017  . Zoster Recombinat (Shingrix) 12/14/2017   Pertinent  Health Maintenance Due  Topic Date Due  . FOOT EXAM  06/27/2018  .  URINE MICROALBUMIN  03/14/2019  . OPHTHALMOLOGY EXAM  07/01/2020  . HEMOGLOBIN A1C  10/15/2020  . INFLUENZA VACCINE  Completed  . PNA vac Low Risk Adult  Completed   Fall Risk  10/02/2018 09/11/2018 08/28/2018 08/08/2018 08/07/2018  Falls in the past year? 0 0 0 0 0  Number falls in past yr: 0 0 0 - 0  Injury with Fall? 0 0 0 - 0  Risk for fall due to : - - - - -   Functional Status Survey:    Vitals:   06/29/20 1442  BP: 118/61  Pulse: 86  Resp: 19  Temp: 97.8 F (36.6 C)  SpO2: 95%  Weight: 115 lb 8 oz (52.4 kg)  Height: 3\' 8"  (1.118 m)   Body mass index is 41.94 kg/m. Physical Exam Vitals and nursing note reviewed.  Constitutional:      Appearance: Normal appearance.  HENT:     Head: Normocephalic and  atraumatic.     Nose: Nose normal.     Mouth/Throat:     Mouth: Mucous membranes are moist.  Eyes:     Extraocular Movements: Extraocular movements intact.     Conjunctiva/sclera: Conjunctivae normal.     Pupils: Pupils are equal, round, and reactive to light.     Comments: Right lower eyelid ectropion   Cardiovascular:     Rate and Rhythm: Normal rate and regular rhythm.     Heart sounds: Murmur heard.    Pulmonary:     Breath sounds: Rales present.     Comments: Left basilar rales.  Abdominal:     Palpations: Abdomen is soft.     Tenderness: There is no abdominal tenderness.  Musculoskeletal:     Cervical back: Normal range of motion and neck supple.     Right lower leg: No edema.     Left lower leg: No edema.     Comments: S/p Bil AKA  Skin:    General: Skin is warm and dry.  Neurological:     General: No focal deficit present.     Mental Status: He is alert and oriented to person, place, and time. Mental status is at baseline.     Comments: Not ambulatory   Psychiatric:        Mood and Affect: Mood normal.        Behavior: Behavior normal.        Thought Content: Thought content normal.        Judgment: Judgment normal.     Labs reviewed: Recent Labs    10/31/19 0000 04/16/20 0000  NA 138 136*  K 4.5 4.5  CL 102 97*  CO2 29* 31*  BUN 21 20  CREATININE 1.2 1.2  CALCIUM 9.1  9.1 9.5   Recent Labs    10/31/19 0000 04/16/20 0000  AST 14 14  ALT 11 12  ALKPHOS 62 65  ALBUMIN 3.4* 3.6   Recent Labs    10/31/19 0000 04/16/20 0000  WBC 7.8 7.5  NEUTROABS  --  4,688.00  HGB 13.1* 13.0*  HCT 39* 39*  PLT 377 415*   Lab Results  Component Value Date   TSH 1.72 10/31/2019   Lab Results  Component Value Date   HGBA1C 7.6 04/16/2020   Lab Results  Component Value Date   CHOL 160 04/16/2020   HDL 53 04/16/2020   LDLCALC 88 04/16/2020   TRIG 99 04/16/2020   CHOLHDL 2.6 08/29/2018    Significant Diagnostic Results in last 30  days:  No  results found.  Assessment/Plan Blood loss anemia Anemia, stable, on Fe, Vit B12. Hgb 13 04/16/20  Constipation Constipation, stable, on Senokot S II prn, prn Bisacodyl 10mg  suppository.  Neuropathic pain Peripheral neuropathy, phantom pain, stable, on Gabapentin 100mg  qd, Tylenol prn   Controlled type 2 diabetes mellitus with stage 3 chronic kidney disease, without long-term current use of insulin (HCC) T2 DM, blood sugar is controlled, on Metformin 500mg  bid, Simvastatin 10mg  qd.05/19/19 urine micro albumin 69(30-299). Hgb a1c 7.6 04/16/20, Bun/creat 20/1.2 04/16/20   Paroxysmal atrial fibrillation (HCC) Afib, heart rate is in control, on Metoprolol 12.5mg  bid, Eliquis 2.5mg  bid, last seen by Cardiology 08/28/19, f/u in 1 year.      Family/ staff Communication: plan of care reviewed with the patient and charge nurse.   Labs/tests ordered:  none  Time spend 35 minutes.

## 2020-06-29 NOTE — Assessment & Plan Note (Signed)
Afib, heart rate is in control, on Metoprolol 12.5mg  bid, Eliquis 2.5mg  bid, last seen by Cardiology 08/28/19, f/u in 1 year.

## 2020-06-30 ENCOUNTER — Encounter: Payer: Self-pay | Admitting: Nurse Practitioner

## 2020-07-07 DIAGNOSIS — L218 Other seborrheic dermatitis: Secondary | ICD-10-CM | POA: Diagnosis not present

## 2020-07-07 DIAGNOSIS — L720 Epidermal cyst: Secondary | ICD-10-CM | POA: Diagnosis not present

## 2020-07-07 DIAGNOSIS — Z85828 Personal history of other malignant neoplasm of skin: Secondary | ICD-10-CM | POA: Diagnosis not present

## 2020-07-07 DIAGNOSIS — L821 Other seborrheic keratosis: Secondary | ICD-10-CM | POA: Diagnosis not present

## 2020-07-07 DIAGNOSIS — L570 Actinic keratosis: Secondary | ICD-10-CM | POA: Diagnosis not present

## 2020-07-07 DIAGNOSIS — L853 Xerosis cutis: Secondary | ICD-10-CM | POA: Diagnosis not present

## 2020-07-07 DIAGNOSIS — L814 Other melanin hyperpigmentation: Secondary | ICD-10-CM | POA: Diagnosis not present

## 2020-07-22 ENCOUNTER — Non-Acute Institutional Stay (SKILLED_NURSING_FACILITY): Payer: PPO | Admitting: Internal Medicine

## 2020-07-22 ENCOUNTER — Encounter: Payer: Self-pay | Admitting: Internal Medicine

## 2020-07-22 DIAGNOSIS — E785 Hyperlipidemia, unspecified: Secondary | ICD-10-CM

## 2020-07-22 DIAGNOSIS — E1122 Type 2 diabetes mellitus with diabetic chronic kidney disease: Secondary | ICD-10-CM | POA: Diagnosis not present

## 2020-07-22 DIAGNOSIS — N183 Chronic kidney disease, stage 3 unspecified: Secondary | ICD-10-CM | POA: Diagnosis not present

## 2020-07-22 DIAGNOSIS — M792 Neuralgia and neuritis, unspecified: Secondary | ICD-10-CM

## 2020-07-22 DIAGNOSIS — I480 Paroxysmal atrial fibrillation: Secondary | ICD-10-CM

## 2020-07-22 DIAGNOSIS — R058 Other specified cough: Secondary | ICD-10-CM | POA: Diagnosis not present

## 2020-07-22 DIAGNOSIS — Z89611 Acquired absence of right leg above knee: Secondary | ICD-10-CM | POA: Diagnosis not present

## 2020-07-22 DIAGNOSIS — Z89612 Acquired absence of left leg above knee: Secondary | ICD-10-CM

## 2020-07-22 DIAGNOSIS — H5789 Other specified disorders of eye and adnexa: Secondary | ICD-10-CM | POA: Diagnosis not present

## 2020-07-22 NOTE — Progress Notes (Signed)
Location: [facility] features editor of Service:  SNF (31)  Provider:   Code Status:  Goals of Care:  Advanced Directives 05/27/2020  Does Patient Have a Medical Advance Directive? Yes  Type of Paramedic of [place];Living will;Out of facility DNR (pink MOST or yellow form)  Does patient want to make changes to medical advance directive? No - Patient declined  Copy of [place] in Chart? Yes - validated most recent copy scanned in chart (See row information)  Pre-existing out of facility DNR order (yellow form or pink MOST form) Yellow form placed in chart (order not valid for inpatient use)     Chief Complaint  Patient presents with  . Acute Visit    HPI: Patient is a 87 y.o. male seen today for an acute visit for Cough With Productive sputum   Long term care resident  Patient has h/o Hypertension, Hyperlipidemia, Diabetes mellitus, h/o Brain stem Infarct s/p TPA In 10/19,Hyponatremia,Atrial Fibrillation on Eliquis since 10/19.,PADs/pBilateralAKA with first Left AKA and thenRight AKA  Has h/o Chronic Cough but recent worsening with clear sputum No Chest pain No Fever or SOB Think it is his sinuses Sputum mostly clear  Also c/o Redness in Right eye with itching and discharge No pain or vision issues Past Medical History:  Diagnosis Date  . BPH (benign prostatic hyperplasia) 10/14/2014  . Cervicalgia 01/04/2016  . Chronic kidney disease   . Contusion of left shoulder 05/27/14  . DM type 2 (diabetes mellitus, type 2) ([facility])   . Dysphagia   . High blood pressure   . History of basal cell cancer 11/21/2005   Right temple  . History of colonic polyps 10/14/2003  . Hyperlipidemia   . Left knee pain 05/27/14  . Peripheral neuropathic pain   . Peripheral vascular disease ([facility])   . SAH (subarachnoid hemorrhage) ([facility]) 2009   TBI  . Stroke ([facility])   . TIA (transient ischemic attack) 11/06/2005   Left eye pain. Slurred speech.  Diaphoresis. No residual effects.     Past Surgical History:  Procedure Laterality Date  . ABDOMINAL AORTOGRAM W/LOWER EXTREMITY N/A 06/12/2018   Procedure: ABDOMINAL AORTOGRAM W/LOWER EXTREMITY;  Surgeon: Marty Heck, MD;  Location: [facility];  Service: Cardiovascular;  Laterality: N/A;  . Achilles tendon repair Left 1998   ruptured tendon  . AMPUTATION Left 10/11/2018   Procedure: Left  AMPUTATION ABOVE KNEE;  Surgeon: Marty Heck, MD;  Location: [facility];  Service: Vascular;  Laterality: Left;  . AMPUTATION Right 02/07/2019   Procedure: AMPUTATION ABOVE KNEE;  Surgeon: Rosetta Posner, MD;  Location: [facility];  Service: Vascular;  Laterality: Right;  . COLONOSCOPY  1992   Polyp removed  . COLONOSCOPY  1998   normal  . ORIF FEMUR FRACTURE Right 1929  . PERIPHERAL VASCULAR INTERVENTION Left 06/12/2018   Procedure: PERIPHERAL VASCULAR INTERVENTION;  Surgeon: Marty Heck, MD;  Location: [facility];  Service: Cardiovascular;  Laterality: Left;  Attempted   . TONSILLECTOMY      Allergies  Allergen Reactions  . Tape Other (See Comments)    TAPE WILL TEAR THE SKIN!!!!    Outpatient Encounter Medications as of 07/22/2020  Medication Sig  . acetaminophen (TYLENOL) 650 MG CR tablet Take 650 mg by mouth every 8 (eight) hours as needed for pain.  Marland Kitchen apixaban (ELIQUIS) 2.5 MG TABS tablet Take 2.5 mg by mouth 2 (two) times daily. Morning: 7am-11am Evening:  7:30pm-11pm  . bisacodyl (DULCOLAX) 10 MG suppository Place 10 mg rectally daily as needed for moderate constipation.  . Calcium Carbonate-Vitamin D 600-400 MG-UNIT tablet Take 1 tablet by mouth daily. Morning: 7am-11am.  . Cholecalciferol (D3-1000) 25 MCG (1000 UT) tablet Take 1,000 Units by mouth daily. Morning 7am-11am.  . gabapentin (NEURONTIN) 100 MG capsule Take 100 mg by mouth at bedtime. 7:30pm-11pm.  . metFORMIN (GLUCOPHAGE) 500 MG tablet Take 500 mg by mouth 2 (two) times daily with a meal.  Morning: 7am-9am Evening: 5pm-7pm.  . metoprolol tartrate (LOPRESSOR) 25 MG tablet Take 12.5 mg by mouth 2 (two) times daily. Morning: 7am-11am Evening: 7pm-11pm  . Propylene Glycol-Glycerin 1-0.3 % SOLN Place 2 drops into both eyes daily as needed (dry eyes).   . sennosides-docusate sodium (SENOKOT-S) 8.6-50 MG tablet Take 2 tablets by mouth at bedtime as needed.   . simvastatin (ZOCOR) 10 MG tablet Take 10 mg by mouth daily. Between the hours of 5pm-7pm.  . vitamin B-12 (CYANOCOBALAMIN) 500 MCG tablet Take 500 mcg by mouth daily.  Marland Kitchen zinc oxide 20 % ointment Apply 1 application topically as needed for irritation. To buttocks after every incontinent episode and as needed for redness. May keep at bedside. As Needed   No facility-administered encounter medications on file as of 07/22/2020.    Review of Systems:  Review of Systems  Constitutional: Negative.   HENT: Negative.   Eyes: Positive for discharge, redness and itching. Negative for photophobia and visual disturbance.  Respiratory: Positive for cough. Negative for shortness of breath.   Cardiovascular: Negative.   Gastrointestinal: Negative.   Genitourinary: Negative.   Musculoskeletal: Positive for gait problem.  Skin: Negative.   Neurological: Negative for dizziness.  Psychiatric/Behavioral: Negative.     Health Maintenance  Topic Date Due  . FOOT EXAM  06/27/2018  . URINE MICROALBUMIN  03/14/2019  . COVID-19 Vaccine (3 - Booster for Moderna series) 01/25/2020  . OPHTHALMOLOGY EXAM  07/01/2020  . HEMOGLOBIN A1C  10/15/2020  . TETANUS/TDAP  04/29/2027  . INFLUENZA VACCINE  Completed  . PNA vac Low Risk Adult  Completed    Physical Exam: Vitals:   07/22/20 2001  BP: (!) 98/53  Pulse: 78  Resp: 16  Temp: (!) 97.4 F (36.3 C)   There is no height or weight on file to calculate BMI. Physical Exam Vitals reviewed.  Constitutional:      Appearance: Normal appearance.  HENT:     Head: Normocephalic.     Nose:  Nose normal.     Comments: No Sinus Tenderness    Mouth/Throat:     Mouth: Mucous membranes are moist.     Pharynx: Oropharynx is clear.  Eyes:     Pupils: Pupils are equal, round, and reactive to light.     Comments: Some Redness in right Eye  Cardiovascular:     Rate and Rhythm: Normal rate. Rhythm irregular.     Pulses: Normal pulses.     Heart sounds: Murmur heard.    Pulmonary:     Effort: Pulmonary effort is normal. No respiratory distress.     Breath sounds: Normal breath sounds. No wheezing or rales.  Abdominal:     General: Abdomen is flat. Bowel sounds are normal.     Palpations: Abdomen is soft.  Musculoskeletal:     Cervical back: Neck supple.     Comments: Bilateral AKA  Skin:    General: Skin is warm.  Neurological:     General: No focal deficit  present.     Mental Status: He is alert.  Psychiatric:        Mood and Affect: Mood normal.     Labs reviewed: Basic Metabolic Panel: Recent Labs    10/31/19 0000 04/16/20 0000  NA 138 136*  K 4.5 4.5  CL 102 97*  CO2 29* 31*  BUN 21 20  CREATININE 1.2 1.2  CALCIUM 9.1  9.1 9.5  TSH 1.72  --    Liver Function Tests: Recent Labs    10/31/19 0000 04/16/20 0000  AST 14 14  ALT 11 12  ALKPHOS 62 65  ALBUMIN 3.4* 3.6   No results for input(s): LIPASE, AMYLASE in the last 8760 hours. No results for input(s): AMMONIA in the last 8760 hours. CBC: Recent Labs    10/31/19 0000 04/16/20 0000  WBC 7.8 7.5  NEUTROABS  --  4,688.00  HGB 13.1* 13.0*  HCT 39* 39*  PLT 377 415*   Lipid Panel: Recent Labs    10/31/19 0000 04/16/20 0000  CHOL 132 160  HDL 42 53  LDLCALC 73 88  TRIG 88 99   Lab Results  Component Value Date   HGBA1C 7.6 04/16/2020    Procedures since last visit: No results found.  Assessment/Plan 1. Cough productive of clear sputum, Bronchitis Will try Mucinex DM BID for 2 weeks with Claritin If no Improvement can consider chest Xray  2. Eye redness Ocuflox for 2  weeks  3. Controlled type 2 diabetes mellitus with stage 3 chronic kidney disease, without long-term current use of insulin (HCC) Continue Metformin  4. Paroxysmal atrial fibrillation (HCC) On Eliquis and Lopressor  5. Neuropathic pain Low dose of Neurontin  6. S/P AKA (above knee amputation) bilateral ([place]) Uses Motorized chair  7. Hyperlipidemia, unspecified hyperlipidemia type On Statin    Labs/tests ordered:  * No order type specified * Next appt:  Visit date not found

## 2020-07-29 DIAGNOSIS — L814 Other melanin hyperpigmentation: Secondary | ICD-10-CM | POA: Diagnosis not present

## 2020-07-29 DIAGNOSIS — L570 Actinic keratosis: Secondary | ICD-10-CM | POA: Diagnosis not present

## 2020-07-29 DIAGNOSIS — Z85828 Personal history of other malignant neoplasm of skin: Secondary | ICD-10-CM | POA: Diagnosis not present

## 2020-07-29 DIAGNOSIS — L853 Xerosis cutis: Secondary | ICD-10-CM | POA: Diagnosis not present

## 2020-08-05 ENCOUNTER — Non-Acute Institutional Stay (SKILLED_NURSING_FACILITY): Payer: PPO | Admitting: Internal Medicine

## 2020-08-05 ENCOUNTER — Encounter: Payer: Self-pay | Admitting: Internal Medicine

## 2020-08-05 DIAGNOSIS — H01002 Unspecified blepharitis right lower eyelid: Secondary | ICD-10-CM | POA: Diagnosis not present

## 2020-08-05 DIAGNOSIS — R058 Other specified cough: Secondary | ICD-10-CM | POA: Diagnosis not present

## 2020-08-05 DIAGNOSIS — N183 Chronic kidney disease, stage 3 unspecified: Secondary | ICD-10-CM | POA: Diagnosis not present

## 2020-08-05 DIAGNOSIS — E785 Hyperlipidemia, unspecified: Secondary | ICD-10-CM | POA: Diagnosis not present

## 2020-08-05 DIAGNOSIS — E1122 Type 2 diabetes mellitus with diabetic chronic kidney disease: Secondary | ICD-10-CM

## 2020-08-05 DIAGNOSIS — Z89611 Acquired absence of right leg above knee: Secondary | ICD-10-CM

## 2020-08-05 DIAGNOSIS — I480 Paroxysmal atrial fibrillation: Secondary | ICD-10-CM | POA: Diagnosis not present

## 2020-08-05 DIAGNOSIS — Z89612 Acquired absence of left leg above knee: Secondary | ICD-10-CM

## 2020-08-05 DIAGNOSIS — M792 Neuralgia and neuritis, unspecified: Secondary | ICD-10-CM

## 2020-08-05 NOTE — Progress Notes (Signed)
Location:    [facility] Room Number: 21 Place of Service:  SNF [phone]) Provider:  Veleta Miners MD  Virgie Dad, MD  Patient Care Team: Virgie Dad, MD as PCP - General (Internal Medicine) Buford Dresser, MD as PCP - Cardiology (Cardiology) Ricard Dillon, MD as Consulting Physician (Internal Medicine)  Extended Emergency Contact Information Primary Emergency Contact: Gordon,[place] Address: [address]          [place], [place] [address] [name] of [place] Phone: [phone] Mobile Phone: [phone] Relation: Daughter  Code Status:  DNR Managed Care Goals of care: Advanced Directive information Advanced Directives 05/27/2020  Does Patient Have a Medical Advance Directive? Yes  Type of Paramedic of [place];Living will;Out of facility DNR (pink MOST or yellow form)  Does patient want to make changes to medical advance directive? No - Patient declined  Copy of [place] in Chart? Yes - validated most recent copy scanned in chart (See row information)  Pre-existing out of facility DNR order (yellow form or pink MOST form) Yellow form placed in chart (order not valid for inpatient use)     Chief Complaint  Patient presents with  . Medical Management of Chronic Issues    HPI:  Pt is a 87 y.o. male seen today for medical management of chronic diseases.    Long term care resident  Patient has h/o Hypertension, Hyperlipidemia, Diabetes mellitus, h/o Brain stem Infarct s/p TPA In 10/19,Hyponatremia,Atrial Fibrillation on Eliquis since 10/19.,PADs/pBilateralAKA with first Left AKA and thenRight AKA  Chronic Productive cough Mucinex did not help that much But he says he has no SOB does not want any other Med  Right Eye Blepharitis Still mildily red Did get better on Ocuflox  Other wise no Complain No More episode of Chest pain that he had few months ago Weight stable.  Appetite good Dependent on Wheelchair. Independent in his transfers  Past Medical History:  Diagnosis Date  . BPH (benign prostatic hyperplasia) 10/14/2014  . Cervicalgia 01/04/2016  . Chronic kidney disease   . Contusion of left shoulder 05/27/14  . DM type 2 (diabetes mellitus, type 2) ([place])   . Dysphagia   . High blood pressure   . History of basal cell cancer 11/21/2005   Right temple  . History of colonic polyps 10/14/2003  . Hyperlipidemia   . Left knee pain 05/27/14  . Peripheral neuropathic pain   . Peripheral vascular disease ([place])   . SAH (subarachnoid hemorrhage) ([facility]) 2009   TBI  . Stroke ([facility])   . TIA (transient ischemic attack) 11/06/2005   Left eye pain. Slurred speech. Diaphoresis. No residual effects.    Past Surgical History:  Procedure Laterality Date  . ABDOMINAL AORTOGRAM W/LOWER EXTREMITY N/A 06/12/2018   Procedure: ABDOMINAL AORTOGRAM W/LOWER EXTREMITY;  Surgeon: Marty Heck, MD;  Location: [facility];  Service: Cardiovascular;  Laterality: N/A;  . Achilles tendon repair Left 1998   ruptured tendon  . AMPUTATION Left 10/11/2018   Procedure: Left  AMPUTATION ABOVE KNEE;  Surgeon: Marty Heck, MD;  Location: [facility];  Service: Vascular;  Laterality: Left;  . AMPUTATION Right 02/07/2019   Procedure: AMPUTATION ABOVE KNEE;  Surgeon: Rosetta Posner, MD;  Location: [facility];  Service: Vascular;  Laterality: Right;  . COLONOSCOPY  1992   Polyp removed  . COLONOSCOPY  1998   normal  . ORIF FEMUR FRACTURE Right 1929  . PERIPHERAL VASCULAR INTERVENTION  Left 06/12/2018   Procedure: PERIPHERAL VASCULAR INTERVENTION;  Surgeon: Marty Heck, MD;  Location: [facility];  Service: Cardiovascular;  Laterality: Left;  Attempted   . TONSILLECTOMY      Allergies  Allergen Reactions  . Tape Other (See Comments)    TAPE WILL TEAR THE SKIN!!!!    Allergies as of 08/05/2020      Reactions   Tape Other (See Comments)   TAPE WILL TEAR THE  SKIN!!!!      Medication List       Accurate as of August 05, 2020  9:10 AM. If you have any questions, ask your nurse or doctor.        acetaminophen 650 MG CR tablet Commonly known as: TYLENOL Take 650 mg by mouth every 8 (eight) hours as needed for pain.   apixaban 2.5 MG Tabs tablet Commonly known as: ELIQUIS Take 2.5 mg by mouth 2 (two) times daily. Morning: 7am-11am Evening: 7:30pm-11pm   bisacodyl 10 MG suppository Commonly known as: DULCOLAX Place 10 mg rectally daily as needed for moderate constipation.   Calcium Carbonate-Vitamin D 600-400 MG-UNIT tablet Take 1 tablet by mouth daily. Morning: 7am-11am.   D3-1000 25 MCG (1000 UT) tablet Generic drug: Cholecalciferol Take 1,000 Units by mouth daily. Morning 7am-11am.   dextromethorphan-guaiFENesin 30-600 MG 12hr tablet Commonly known as: MUCINEX DM Take 1 tablet by mouth 2 (two) times daily.   gabapentin 100 MG capsule Commonly known as: NEURONTIN Take 100 mg by mouth at bedtime. 7:30pm-11pm.   loratadine 10 MG tablet Commonly known as: CLARITIN Take 10 mg by mouth daily.   metFORMIN 500 MG tablet Commonly known as: GLUCOPHAGE Take 500 mg by mouth 2 (two) times daily with a meal. Morning: 7am-9am Evening: 5pm-7pm.   metoprolol tartrate 25 MG tablet Commonly known as: LOPRESSOR Take 12.5 mg by mouth 2 (two) times daily. Morning: 7am-11am Evening: 7pm-11pm   ofloxacin 0.3 % ophthalmic solution Commonly known as: OCUFLOX 1 drop 4 (four) times daily.   Propylene Glycol-Glycerin 1-0.3 % Soln Place 2 drops into both eyes daily as needed (dry eyes).   sennosides-docusate sodium 8.6-50 MG tablet Commonly known as: SENOKOT-S Take 2 tablets by mouth at bedtime as needed.   simvastatin 10 MG tablet Commonly known as: ZOCOR Take 10 mg by mouth daily. Between the hours of 5pm-7pm.   vitamin B-12 500 MCG tablet Commonly known as: CYANOCOBALAMIN Take 500 mcg by mouth daily.   zinc oxide 20 %  ointment Apply 1 application topically as needed for irritation. To buttocks after every incontinent episode and as needed for redness. May keep at bedside. As Needed       Review of Systems  Review of Systems  Constitutional: Negative for activity change, appetite change, chills, diaphoresis, fatigue and fever.  HENT: Negative for mouth sores, postnasal drip, rhinorrhea, sinus pain and sore throat.   Respiratory: Negative for apnea,  chest tightness, shortness of breath and wheezing.   Cardiovascular: Negative for chest pain, palpitations and leg swelling.  Gastrointestinal: Negative for abdominal distention, abdominal pain, constipation, diarrhea, nausea and vomiting.  Genitourinary: Negative for dysuria and frequency.  Musculoskeletal: Negative for arthralgias, joint swelling and myalgias.  Skin: Negative for rash.  Neurological: Negative for dizziness, syncope, weakness, light-headedness and numbness.  Psychiatric/Behavioral: Negative for behavioral problems, confusion and sleep disturbance.     Immunization History  Administered Date(s) Administered  . DTaP 12/31/2012  . Influenza, High Dose Seasonal PF 04/04/2017, 04/10/2019  . Influenza,inj,Quad PF,6+ Mos  03/28/2018  . Influenza-Unspecified 04/10/2014, 03/25/2015, 04/06/2016, 03/30/2020  . Moderna Sars-Covid-2 Vaccination 06/30/2019, 07/28/2019  . PPD Test 04/29/2014  . Pneumococcal Conjugate-13 04/28/2017  . Pneumococcal-Unspecified 03/13/2011  . Td 12/24/2012  . Tdap 04/28/2017  . Zoster Recombinat (Shingrix) 12/14/2017   Pertinent  Health Maintenance Due  Topic Date Due  . FOOT EXAM  06/27/2018  . URINE MICROALBUMIN  03/14/2019  . OPHTHALMOLOGY EXAM  07/01/2020  . HEMOGLOBIN A1C  10/15/2020  . INFLUENZA VACCINE  Completed  . PNA vac Low Risk Adult  Completed   Fall Risk  10/02/2018 09/11/2018 08/28/2018 08/08/2018 08/07/2018  Falls in the past year? 0 0 0 0 0  Number falls in past yr: 0 0 0 - 0  Injury with Fall? 0  0 0 - 0  Risk for fall due to : - - - - -   Functional Status Survey:    Vitals:   08/05/20 0902  BP: (!) 117/55  Pulse: 71  Resp: 20  Temp: (!) 96.9 F (36.1 C)  SpO2: 99%  Weight: 114 lb 8 oz (51.9 kg)  Height: 3\' 8"  (1.118 m)   Body mass index is 41.58 kg/m. Physical Exam  Constitutional: Oriented to person, place, and time. Well-developed and well-nourished.  HENT:  Head: Normocephalic.  Mouth/Throat: Oropharynx is clear and moist.  Eyes: Pupils are equal, round, and reactive to light.  Neck: Neck supple.  Cardiovascular: Normal rate and normal heart sounds.   murmur heard. Pulmonary/Chest: Effort normal and breath sounds normal. No respiratory distress. No wheezes. She has no rales.  Abdominal: Soft. Bowel sounds are normal. No distension. There is no tenderness. There is no rebound.  Musculoskeletal: No edema. S/p AKA bilateral Lymphadenopathy: none Neurological: Alert and oriented to person, place, and time.  Skin: Skin is warm and dry.  Psychiatric: Normal mood and affect. Behavior is normal. Thought content normal.    Labs reviewed: Recent Labs    10/31/19 0000 04/16/20 0000  NA 138 136*  K 4.5 4.5  CL 102 97*  CO2 29* 31*  BUN 21 20  CREATININE 1.2 1.2  CALCIUM 9.1  9.1 9.5   Recent Labs    10/31/19 0000 04/16/20 0000  AST 14 14  ALT 11 12  ALKPHOS 62 65  ALBUMIN 3.4* 3.6   Recent Labs    10/31/19 0000 04/16/20 0000  WBC 7.8 7.5  NEUTROABS  --  4,688.00  HGB 13.1* 13.0*  HCT 39* 39*  PLT 377 415*   Lab Results  Component Value Date   TSH 1.72 10/31/2019   Lab Results  Component Value Date   HGBA1C 7.6 04/16/2020   Lab Results  Component Value Date   CHOL 160 04/16/2020   HDL 53 04/16/2020   LDLCALC 88 04/16/2020   TRIG 99 04/16/2020   CHOLHDL 2.6 08/29/2018    Significant Diagnostic Results in last 30 days:  No results found.  Assessment/Plan Cough productive of clear sputum Mucinex No Other treatment per his  request Blepharitis of right lower eyelid, unspecified type Start oN erythromycin BID Controlled type 2 diabetes mellitus with stage 3 chronic kidney disease, without long-term current use of insulin (HCC) On Metformin Repeat A1C Paroxysmal atrial fibrillation (HCC) On Eliquis Neuropathic pain Continue Neurontin S/P AKA (above knee amputation) bilateral (HCC) Supportive care Hyperlipidemia, unspecified hyperlipidemia type Repeat Lipid On Statin H/o CVA Continue Eliquis and statin   Family/ staff Communication:   Labs/tests ordered:  CBC,CMP,LIpid,A1C

## 2020-08-09 DIAGNOSIS — E119 Type 2 diabetes mellitus without complications: Secondary | ICD-10-CM | POA: Diagnosis not present

## 2020-08-09 DIAGNOSIS — E785 Hyperlipidemia, unspecified: Secondary | ICD-10-CM | POA: Diagnosis not present

## 2020-08-09 DIAGNOSIS — F0390 Unspecified dementia without behavioral disturbance: Secondary | ICD-10-CM | POA: Diagnosis not present

## 2020-08-09 DIAGNOSIS — D649 Anemia, unspecified: Secondary | ICD-10-CM | POA: Diagnosis not present

## 2020-08-10 LAB — HEPATIC FUNCTION PANEL
ALT: 12 (ref 10–40)
AST: 13 — AB (ref 14–40)
Alkaline Phosphatase: 55 (ref 25–125)
Bilirubin, Total: 0.5

## 2020-08-10 LAB — BASIC METABOLIC PANEL
BUN: 24 — AB (ref 4–21)
CO2: 31 — AB (ref 13–22)
Chloride: 102 (ref 99–108)
Creatinine: 1.2 (ref 0.6–1.3)
Glucose: 131
Potassium: 4.7 (ref 3.4–5.3)
Sodium: 139 (ref 137–147)

## 2020-08-10 LAB — LIPID PANEL
Cholesterol: 138 (ref 0–200)
HDL: 43 (ref 35–70)
LDL Cholesterol: 78
LDl/HDL Ratio: 3.2
Triglycerides: 83 (ref 40–160)

## 2020-08-10 LAB — HEMOGLOBIN A1C: Hemoglobin A1C: 7.6

## 2020-08-10 LAB — CBC AND DIFFERENTIAL
HCT: 34 — AB (ref 41–53)
Hemoglobin: 11.5 — AB (ref 13.5–17.5)
Neutrophils Absolute: 5150
Platelets: 360 (ref 150–399)
WBC: 8.2

## 2020-08-10 LAB — COMPREHENSIVE METABOLIC PANEL
Albumin: 3.1 — AB (ref 3.5–5.0)
Calcium: 8.8 (ref 8.7–10.7)
Globulin: 2.3

## 2020-08-10 LAB — CBC: RBC: 3.42 — AB (ref 3.87–5.11)

## 2020-08-24 ENCOUNTER — Encounter: Payer: Self-pay | Admitting: Nurse Practitioner

## 2020-08-24 ENCOUNTER — Non-Acute Institutional Stay (SKILLED_NURSING_FACILITY): Payer: PPO | Admitting: Nurse Practitioner

## 2020-08-24 DIAGNOSIS — D500 Iron deficiency anemia secondary to blood loss (chronic): Secondary | ICD-10-CM

## 2020-08-24 DIAGNOSIS — N183 Chronic kidney disease, stage 3 unspecified: Secondary | ICD-10-CM

## 2020-08-24 DIAGNOSIS — E1122 Type 2 diabetes mellitus with diabetic chronic kidney disease: Secondary | ICD-10-CM | POA: Diagnosis not present

## 2020-08-24 DIAGNOSIS — K5900 Constipation, unspecified: Secondary | ICD-10-CM | POA: Diagnosis not present

## 2020-08-24 DIAGNOSIS — E785 Hyperlipidemia, unspecified: Secondary | ICD-10-CM

## 2020-08-24 DIAGNOSIS — I480 Paroxysmal atrial fibrillation: Secondary | ICD-10-CM | POA: Diagnosis not present

## 2020-08-24 DIAGNOSIS — M792 Neuralgia and neuritis, unspecified: Secondary | ICD-10-CM

## 2020-08-24 DIAGNOSIS — I6300 Cerebral infarction due to thrombosis of unspecified precerebral artery: Secondary | ICD-10-CM

## 2020-08-24 NOTE — Assessment & Plan Note (Signed)
stable, on Senokot S II prn, prn Bisacodyl 10mg  suppository.

## 2020-08-24 NOTE — Assessment & Plan Note (Addendum)
stable, on Fe, Vit B12.Hgb 11.5 08/09/20

## 2020-08-24 NOTE — Assessment & Plan Note (Signed)
Hyperlipidemia, takes Simvastatin, LDL 78 08/09/20

## 2020-08-24 NOTE — Assessment & Plan Note (Signed)
Afib, heart rate is in control, on Metoprolol 12.5mg  bid, Eliquis 2.5mg  bid. Last Cardiology 08/28/19

## 2020-08-24 NOTE — Assessment & Plan Note (Signed)
Hx of CVA takes Eliquis, Simvastatin.

## 2020-08-24 NOTE — Progress Notes (Signed)
Location:  [facility] Room Number: 21 Place of Service:  SNF (31) Provider: Lennie Odor Mast NP  Virgie Dad, MD  Patient Care Team: Virgie Dad, MD as PCP - General (Internal Medicine) Buford Dresser, MD as PCP - Cardiology (Cardiology) Ricard Dillon, MD as Consulting Physician (Internal Medicine)  Extended Emergency Contact Information Primary Emergency Contact: Gordon,[place] Address: [address]          [place], [place] [address] [name] of [place] Phone: [phone] Mobile Phone: [phone] Relation: Daughter  Code Status:  DNR Goals of care: Advanced Directive information Advanced Directives 05/27/2020  Does Patient Have a Medical Advance Directive? Yes  Type of Paramedic of [place];Living will;Out of facility DNR (pink MOST or yellow form)  Does patient want to make changes to medical advance directive? No - Patient declined  Copy of [place] in Chart? Yes - validated most recent copy scanned in chart (See row information)  Pre-existing out of facility DNR order (yellow form or pink MOST form) Yellow form placed in chart (order not valid for inpatient use)     Chief Complaint  Patient presents with  . Medical Management of Chronic Issues    HPI:  Pt is a 87 y.o. male seen today for medical management of chronic diseases.     Anemia, stable, on Fe, Vit B12.Hgb 11.5 08/09/20 Constipation, stable, on Senokot S II prn, prn Bisacodyl 10mg  suppository.  Peripheral neuropathy, phantom pain, s/p AKA bilateral, stable, on Gabapentin 100mg  qd, Tylenol prn T2 DM, blood sugar is controlled, on Metformin 500mg  bid, Simvastatin 10mg  qd.05/19/19 urine micro albumin 69(30-299). Hgb a1c 8.2, Bun/creat 24/1.16 08/09/20  Hyperlipidemia, takes Simvastatin, LDL 78 08/09/20 Afib, heart rate is in control, on Metoprolol 12.5mg  bid, Eliquis 2.5mg  bid. Last  Cardiology 08/28/19  Hx of CVA takes Eliquis, Simvastatin.   Past Medical History:  Diagnosis Date  . BPH (benign prostatic hyperplasia) 10/14/2014  . Cervicalgia 01/04/2016  . Chronic kidney disease   . Contusion of left shoulder 05/27/14  . DM type 2 (diabetes mellitus, type 2) ([facility])   . Dysphagia   . High blood pressure   . History of basal cell cancer 11/21/2005   Right temple  . History of colonic polyps 10/14/2003  . Hyperlipidemia   . Left knee pain 05/27/14  . Peripheral neuropathic pain   . Peripheral vascular disease ([facility])   . SAH (subarachnoid hemorrhage) ([facility]) 2009   TBI  . Stroke ([facility])   . TIA (transient ischemic attack) 11/06/2005   Left eye pain. Slurred speech. Diaphoresis. No residual effects.    Past Surgical History:  Procedure Laterality Date  . ABDOMINAL AORTOGRAM W/LOWER EXTREMITY N/A 06/12/2018   Procedure: ABDOMINAL AORTOGRAM W/LOWER EXTREMITY;  Surgeon: Marty Heck, MD;  Location: [facility];  Service: Cardiovascular;  Laterality: N/A;  . Achilles tendon repair Left 1998   ruptured tendon  . AMPUTATION Left 10/11/2018   Procedure: Left  AMPUTATION ABOVE KNEE;  Surgeon: Marty Heck, MD;  Location: [facility];  Service: Vascular;  Laterality: Left;  . AMPUTATION Right 02/07/2019   Procedure: AMPUTATION ABOVE KNEE;  Surgeon: Rosetta Posner, MD;  Location: [facility];  Service: Vascular;  Laterality: Right;  . COLONOSCOPY  1992   Polyp removed  . COLONOSCOPY  1998   normal  . ORIF FEMUR FRACTURE Right 1929  . PERIPHERAL VASCULAR INTERVENTION Left 06/12/2018   Procedure: PERIPHERAL VASCULAR INTERVENTION;  Surgeon: Carlis Abbott,  Gwenyth Allegra, MD;  Location: [facility];  Service: Cardiovascular;  Laterality: Left;  Attempted   . TONSILLECTOMY      Allergies  Allergen Reactions  . Tape Other (See Comments)    TAPE WILL TEAR THE SKIN!!!!    Allergies as of 08/24/2020      Reactions   Tape Other (See Comments)   TAPE WILL TEAR THE SKIN!!!!       Medication List       Accurate as of August 24, 2020  3:45 PM. If you have any questions, ask your nurse or doctor.        acetaminophen 650 MG CR tablet Commonly known as: TYLENOL Take 650 mg by mouth every 8 (eight) hours as needed for pain.   apixaban 2.5 MG Tabs tablet Commonly known as: ELIQUIS Take 2.5 mg by mouth 2 (two) times daily. Morning: 7am-11am Evening: 7:30pm-11pm   bisacodyl 10 MG suppository Commonly known as: DULCOLAX Place 10 mg rectally daily as needed for moderate constipation.   Calcium Carbonate-Vitamin D 600-400 MG-UNIT tablet Take 1 tablet by mouth daily. Morning: 7am-11am.   D3-1000 25 MCG (1000 UT) tablet Generic drug: Cholecalciferol Take 1,000 Units by mouth daily. Morning 7am-11am.   erythromycin ophthalmic ointment Place 1 application into the right eye 2 (two) times daily.   gabapentin 100 MG capsule Commonly known as: NEURONTIN Take 100 mg by mouth at bedtime. 7:30pm-11pm.   loratadine 10 MG tablet Commonly known as: CLARITIN Take 10 mg by mouth daily.   metFORMIN 500 MG tablet Commonly known as: GLUCOPHAGE Take 500 mg by mouth 2 (two) times daily with a meal. Morning: 7am-9am Evening: 5pm-7pm.   metoprolol tartrate 25 MG tablet Commonly known as: LOPRESSOR Take 12.5 mg by mouth 2 (two) times daily. Morning: 7am-11am Evening: 7pm-11pm   Propylene Glycol-Glycerin 1-0.3 % Soln Place 2 drops into both eyes daily as needed (dry eyes).   sennosides-docusate sodium 8.6-50 MG tablet Commonly known as: SENOKOT-S Take 2 tablets by mouth at bedtime as needed.   simvastatin 10 MG tablet Commonly known as: ZOCOR Take 10 mg by mouth daily. Between the hours of 5pm-7pm.   vitamin B-12 500 MCG tablet Commonly known as: CYANOCOBALAMIN Take 500 mcg by mouth daily.   zinc oxide 20 % ointment Apply 1 application topically as needed for irritation. To buttocks after every incontinent episode and as needed for redness. May keep at  bedside. As Needed       Review of Systems  Constitutional: Positive for unexpected weight change. Negative for fatigue and fever.       ? Weight loss about #9Ibs in the past 3 weeks.   HENT: Positive for hearing loss and trouble swallowing. Negative for congestion.   Eyes: Negative for visual disturbance.  Respiratory: Positive for cough. Negative for chest tightness and shortness of breath.        Chronic cough with swallowing.   Cardiovascular: Negative for chest pain and leg swelling.  Gastrointestinal: Negative for abdominal pain and constipation.  Genitourinary: Negative for difficulty urinating, dysuria and urgency.  Musculoskeletal: Positive for gait problem.  Skin: Negative for color change.  Neurological: Negative for speech difficulty, weakness, light-headedness and headaches.  Psychiatric/Behavioral: Negative for confusion and sleep disturbance. The patient is not nervous/anxious.     Immunization History  Administered Date(s) Administered  . DTaP 12/31/2012  . Influenza, High Dose Seasonal PF 04/04/2017, 04/10/2019  . Influenza,inj,Quad PF,6+ Mos 03/28/2018  . Influenza-Unspecified 04/10/2014, 03/25/2015, 04/06/2016, 03/30/2020  .  Moderna Sars-Covid-2 Vaccination 06/30/2019, 07/28/2019  . PPD Test 04/29/2014  . Pneumococcal Conjugate-13 04/28/2017  . Pneumococcal-Unspecified 03/13/2011  . Td 12/24/2012  . Tdap 04/28/2017  . Zoster Recombinat (Shingrix) 12/14/2017   Pertinent  Health Maintenance Due  Topic Date Due  . FOOT EXAM  06/27/2018  . URINE MICROALBUMIN  03/14/2019  . OPHTHALMOLOGY EXAM  07/01/2020  . HEMOGLOBIN A1C  02/07/2021  . INFLUENZA VACCINE  Completed  . PNA vac Low Risk Adult  Completed   Fall Risk  10/02/2018 09/11/2018 08/28/2018 08/08/2018 08/07/2018  Falls in the past year? 0 0 0 0 0  Number falls in past yr: 0 0 0 - 0  Injury with Fall? 0 0 0 - 0  Risk for fall due to : - - - - -   Functional Status Survey:    Vitals:   08/24/20 1452   BP: 116/76  Pulse: 72  Resp: 19  Temp: (!) 97.3 F (36.3 C)  SpO2: 94%  Weight: 105 lb 4.8 oz (47.8 kg)  Height: 3\' 8"  (1.118 m)   Body mass index is 38.24 kg/m. Physical Exam Vitals and nursing note reviewed.  Constitutional:      Appearance: Normal appearance.  HENT:     Head: Normocephalic and atraumatic.     Nose: Nose normal.     Mouth/Throat:     Mouth: Mucous membranes are moist.  Eyes:     Extraocular Movements: Extraocular movements intact.     Conjunctiva/sclera: Conjunctivae normal.     Pupils: Pupils are equal, round, and reactive to light.     Comments: Right lower eyelid ectropion   Cardiovascular:     Rate and Rhythm: Normal rate and regular rhythm.     Heart sounds: Murmur heard.    Pulmonary:     Breath sounds: Rales present.     Comments: Left basilar rales.  Abdominal:     Palpations: Abdomen is soft.     Tenderness: There is no abdominal tenderness.  Musculoskeletal:     Cervical back: Normal range of motion and neck supple.     Right lower leg: No edema.     Left lower leg: No edema.     Comments: S/p Bil AKA  Skin:    General: Skin is warm and dry.  Neurological:     General: No focal deficit present.     Mental Status: He is alert and oriented to person, place, and time. Mental status is at baseline.     Comments: Not ambulatory   Psychiatric:        Mood and Affect: Mood normal.        Behavior: Behavior normal.        Thought Content: Thought content normal.        Judgment: Judgment normal.     Labs reviewed: Recent Labs    10/31/19 0000 04/16/20 0000 08/10/20 0000  NA 138 136* 139  K 4.5 4.5 4.7  CL 102 97* 102  CO2 29* 31* 31*  BUN 21 20 24*  CREATININE 1.2 1.2 1.2  CALCIUM 9.1  9.1 9.5 8.8   Recent Labs    10/31/19 0000 04/16/20 0000 08/10/20 0000  AST 14 14 13*  ALT 11 12 12   ALKPHOS 62 65 55  ALBUMIN 3.4* 3.6 3.1*   Recent Labs    10/31/19 0000 04/16/20 0000 08/10/20 0000  WBC 7.8 7.5 8.2  NEUTROABS   --  4,688.00 5,150.00  HGB 13.1* 13.0* 11.5*  HCT  39* 39* 34*  PLT 377 415* 360   Lab Results  Component Value Date   TSH 1.72 10/31/2019   Lab Results  Component Value Date   HGBA1C 7.6 08/10/2020   Lab Results  Component Value Date   CHOL 138 08/10/2020   HDL 43 08/10/2020   LDLCALC 78 08/10/2020   TRIG 83 08/10/2020   CHOLHDL 2.6 08/29/2018    Significant Diagnostic Results in last 30 days:  No results found.  Assessment/Plan Blood loss anemia  stable, on Fe, Vit B12.Hgb 11.5 08/09/20   Constipation stable, on Senokot S II prn, prn Bisacodyl 10mg  suppository.   Neuropathic pain Peripheral neuropathy, phantom pain, s/p AKA bilateral, stable, on Gabapentin 100mg  qd, Tylenol prn   Controlled type 2 diabetes mellitus with stage 3 chronic kidney disease, without long-term current use of insulin (HCC) T2 DM, blood sugar is controlled, on Metformin 500mg  bid, Simvastatin 10mg  qd.05/19/19 urine micro albumin 69(30-299). Hgb a1c 8.2, Bun/creat 24/1.16 08/09/20   Hyperlipidemia LDL goal <70 Hyperlipidemia, takes Simvastatin, LDL 78 08/09/20   Paroxysmal atrial fibrillation (HCC) Afib, heart rate is in control, on Metoprolol 12.5mg  bid, Eliquis 2.5mg  bid. Last Cardiology 08/28/19   Stroke [facility]) Hx of CVA takes Eliquis, Simvastatin.    Family/ staff Communication: plan of care reviewed with the patient and charge nurse.   Labs/tests ordered:  none  Time spend 35 minutes.

## 2020-08-24 NOTE — Assessment & Plan Note (Signed)
T2 DM, blood sugar is controlled, on Metformin 500mg  bid, Simvastatin 10mg  qd.05/19/19 urine micro albumin 69(30-299). Hgb a1c 8.2, Bun/creat 24/1.16 08/09/20

## 2020-08-24 NOTE — Assessment & Plan Note (Signed)
Peripheral neuropathy, phantom pain, s/p AKA bilateral, stable, on Gabapentin 100mg  qd, Tylenol prn

## 2020-09-29 ENCOUNTER — Non-Acute Institutional Stay (SKILLED_NURSING_FACILITY): Payer: PPO | Admitting: Nurse Practitioner

## 2020-09-29 DIAGNOSIS — I480 Paroxysmal atrial fibrillation: Secondary | ICD-10-CM | POA: Diagnosis not present

## 2020-09-29 DIAGNOSIS — Z89611 Acquired absence of right leg above knee: Secondary | ICD-10-CM

## 2020-09-29 DIAGNOSIS — M792 Neuralgia and neuritis, unspecified: Secondary | ICD-10-CM

## 2020-09-29 DIAGNOSIS — Z89612 Acquired absence of left leg above knee: Secondary | ICD-10-CM

## 2020-09-29 DIAGNOSIS — E1122 Type 2 diabetes mellitus with diabetic chronic kidney disease: Secondary | ICD-10-CM

## 2020-09-29 DIAGNOSIS — D500 Iron deficiency anemia secondary to blood loss (chronic): Secondary | ICD-10-CM | POA: Diagnosis not present

## 2020-09-29 DIAGNOSIS — E785 Hyperlipidemia, unspecified: Secondary | ICD-10-CM | POA: Diagnosis not present

## 2020-09-29 DIAGNOSIS — N183 Chronic kidney disease, stage 3 unspecified: Secondary | ICD-10-CM | POA: Diagnosis not present

## 2020-09-29 DIAGNOSIS — I6300 Cerebral infarction due to thrombosis of unspecified precerebral artery: Secondary | ICD-10-CM

## 2020-09-29 DIAGNOSIS — K5900 Constipation, unspecified: Secondary | ICD-10-CM | POA: Diagnosis not present

## 2020-09-30 DIAGNOSIS — L821 Other seborrheic keratosis: Secondary | ICD-10-CM | POA: Diagnosis not present

## 2020-09-30 DIAGNOSIS — Z85828 Personal history of other malignant neoplasm of skin: Secondary | ICD-10-CM | POA: Diagnosis not present

## 2020-09-30 DIAGNOSIS — L570 Actinic keratosis: Secondary | ICD-10-CM | POA: Diagnosis not present

## 2020-10-01 ENCOUNTER — Encounter: Payer: Self-pay | Admitting: Nurse Practitioner

## 2020-10-01 NOTE — Progress Notes (Signed)
Location:   SNF [place] Room Number: 21 Place of Service:  SNF (31) Provider: [facility] Modesty Rudy NP  Virgie Dad, MD  Patient Care Team: Virgie Dad, MD as PCP - General (Internal Medicine) Buford Dresser, MD as PCP - Cardiology (Cardiology) Ricard Dillon, MD as Consulting Physician (Internal Medicine)  Extended Emergency Contact Information Primary Emergency Contact: Gordon,[place] Address: [address]          [place], [place] [address] [name] of [place] Phone: [phone] Mobile Phone: [phone] Relation: Daughter  Code Status:  DNR Goals of care: Advanced Directive information Advanced Directives 05/27/2020  Does Patient Have a Medical Advance Directive? Yes  Type of Paramedic of [place];Living will;Out of facility DNR (pink MOST or yellow form)  Does patient want to make changes to medical advance directive? No - Patient declined  Copy of [place] in Chart? Yes - validated most recent copy scanned in chart (See row information)  Pre-existing out of facility DNR order (yellow form or pink MOST form) Yellow form placed in chart (order not valid for inpatient use)     Chief Complaint  Patient presents with  . Medical Management of Chronic Issues    HPI:  Pt is a 87 y.o. male seen today for medical management of chronic diseases.      Anemia, stable, on Fe, Vit B12.Hgb 11.5 08/09/20 Constipation, stable, on Senokot S II prn, prn Bisacodyl 10mg  suppository.             Peripheral neuropathy, phantom pain, s/p AKA bilateral, stable, on Gabapentin 100mg  qd, Tylenol prn T2 DM, blood sugar is controlled, on Metformin 500mg  bid, Simvastatin 10mg  qd.05/19/19 urine micro albumin 69(30-299). Hgb a1c8.2, Bun/creat 24/1.16 08/09/20             Hyperlipidemia, takes Simvastatin, LDL 78 08/09/20 Afib, heart rate is in control, on Metoprolol12.5mg  bid, Eliquis 2.5mg   bid. Last Cardiology 08/28/19             Hx of CVA takes Eliquis, Simvastatin.   Past Medical History:  Diagnosis Date  . BPH (benign prostatic hyperplasia) 10/14/2014  . Cervicalgia 01/04/2016  . Chronic kidney disease   . Contusion of left shoulder 05/27/14  . DM type 2 (diabetes mellitus, type 2) ([place])   . Dysphagia   . High blood pressure   . History of basal cell cancer 11/21/2005   Right temple  . History of colonic polyps 10/14/2003  . Hyperlipidemia   . Left knee pain 05/27/14  . Peripheral neuropathic pain   . Peripheral vascular disease ([place])   . SAH (subarachnoid hemorrhage) ([place]) 2009   TBI  . Stroke ([place])   . TIA (transient ischemic attack) 11/06/2005   Left eye pain. Slurred speech. Diaphoresis. No residual effects.    Past Surgical History:  Procedure Laterality Date  . ABDOMINAL AORTOGRAM W/LOWER EXTREMITY N/A 06/12/2018   Procedure: ABDOMINAL AORTOGRAM W/LOWER EXTREMITY;  Surgeon: Marty Heck, MD;  Location: [place] CV LAB;  Service: Cardiovascular;  Laterality: N/A;  . Achilles tendon repair Left 1998   ruptured tendon  . AMPUTATION Left 10/11/2018   Procedure: Left  AMPUTATION ABOVE KNEE;  Surgeon: Marty Heck, MD;  Location: [place];  Service: Vascular;  Laterality: Left;  . AMPUTATION Right 02/07/2019   Procedure: AMPUTATION ABOVE KNEE;  Surgeon: Rosetta Posner, MD;  Location: [facility];  Service: Vascular;  Laterality: Right;  . COLONOSCOPY  1992   Polyp  removed  . COLONOSCOPY  1998   normal  . ORIF FEMUR FRACTURE Right 1929  . PERIPHERAL VASCULAR INTERVENTION Left 06/12/2018   Procedure: PERIPHERAL VASCULAR INTERVENTION;  Surgeon: Marty Heck, MD;  Location: [facility] CV LAB;  Service: Cardiovascular;  Laterality: Left;  Attempted   . TONSILLECTOMY      Allergies  Allergen Reactions  . Tape Other (See Comments)    TAPE WILL TEAR THE SKIN!!!!    Allergies as of 09/29/2020      Reactions   Tape Other (See Comments)   TAPE WILL  TEAR THE SKIN!!!!      Medication List       Accurate as of September 29, 2020 11:59 PM. If you have any questions, ask your nurse or doctor.        acetaminophen 650 MG CR tablet Commonly known as: TYLENOL Take 650 mg by mouth every 8 (eight) hours as needed for pain.   apixaban 2.5 MG Tabs tablet Commonly known as: ELIQUIS Take 2.5 mg by mouth 2 (two) times daily. Morning: 7am-11am Evening: 7:30pm-11pm   bisacodyl 10 MG suppository Commonly known as: DULCOLAX Place 10 mg rectally daily as needed for moderate constipation.   Calcium Carbonate-Vitamin D 600-400 MG-UNIT tablet Take 1 tablet by mouth daily. Morning: 7am-11am.   D3-1000 25 MCG (1000 UT) tablet Generic drug: Cholecalciferol Take 1,000 Units by mouth daily. Morning 7am-11am.   gabapentin 100 MG capsule Commonly known as: NEURONTIN Take 100 mg by mouth at bedtime. 7:30pm-11pm.   metFORMIN 500 MG tablet Commonly known as: GLUCOPHAGE Take 500 mg by mouth 2 (two) times daily with a meal. Morning: 7am-9am Evening: 5pm-7pm.   metoprolol tartrate 25 MG tablet Commonly known as: LOPRESSOR Take 12.5 mg by mouth 2 (two) times daily. Morning: 7am-11am Evening: 7pm-11pm   Propylene Glycol-Glycerin 1-0.3 % Soln Place 2 drops into both eyes daily as needed (dry eyes).   sennosides-docusate sodium 8.6-50 MG tablet Commonly known as: SENOKOT-S Take 2 tablets by mouth at bedtime as needed.   simvastatin 10 MG tablet Commonly known as: ZOCOR Take 10 mg by mouth daily. Between the hours of 5pm-7pm.   vitamin B-12 500 MCG tablet Commonly known as: CYANOCOBALAMIN Take 500 mcg by mouth daily.   zinc oxide 20 % ointment Apply 1 application topically as needed for irritation. To buttocks after every incontinent episode and as needed for redness. May keep at bedside. As Needed       Review of Systems  Constitutional: Negative for activity change, appetite change and fever.  HENT: Positive for hearing loss and trouble  swallowing. Negative for congestion.   Eyes: Negative for visual disturbance.  Respiratory: Positive for cough. Negative for chest tightness and shortness of breath.        Chronic cough with swallowing.   Cardiovascular: Negative for leg swelling.  Gastrointestinal: Negative for abdominal pain and constipation.  Genitourinary: Negative for dysuria and urgency.  Musculoskeletal: Positive for gait problem.       S/p Bil AKA  Skin: Negative for color change.  Neurological: Negative for speech difficulty, weakness and light-headedness.  Psychiatric/Behavioral: Negative for confusion and sleep disturbance. The patient is not nervous/anxious.     Immunization History  Administered Date(s) Administered  . DTaP 12/31/2012  . Influenza, High Dose Seasonal PF 04/04/2017, 04/10/2019  . Influenza,inj,Quad PF,6+ Mos 03/28/2018  . Influenza-Unspecified 04/10/2014, 03/25/2015, 04/06/2016, 03/30/2020  . Moderna Sars-Covid-2 Vaccination 06/30/2019, 07/28/2019  . PPD Test 04/29/2014  . Pneumococcal Conjugate-13 04/28/2017  .  Pneumococcal-Unspecified 03/13/2011  . Td 12/24/2012  . Tdap 04/28/2017  . Zoster Recombinat (Shingrix) 12/14/2017   Pertinent  Health Maintenance Due  Topic Date Due  . FOOT EXAM  06/27/2018  . URINE MICROALBUMIN  03/14/2019  . OPHTHALMOLOGY EXAM  07/01/2020  . INFLUENZA VACCINE  01/24/2021  . HEMOGLOBIN A1C  02/07/2021  . PNA vac Low Risk Adult  Completed   Fall Risk  10/02/2018 09/11/2018 08/28/2018 08/08/2018 08/07/2018  Falls in the past year? 0 0 0 0 0  Number falls in past yr: 0 0 0 - 0  Injury with Fall? 0 0 0 - 0  Risk for fall due to : - - - - -   Functional Status Survey:    Vitals:   09/29/20 1155  BP: 120/83  Pulse: 72  Resp: 16  Temp: (!) 96.8 F (36 C)  SpO2: 94%   There is no height or weight on file to calculate BMI. Physical Exam Vitals and nursing note reviewed.  Constitutional:      Appearance: Normal appearance.  HENT:     Head:  Normocephalic and atraumatic.     Nose: Nose normal.     Mouth/Throat:     Mouth: Mucous membranes are moist.  Eyes:     Extraocular Movements: Extraocular movements intact.     Conjunctiva/sclera: Conjunctivae normal.     Pupils: Pupils are equal, round, and reactive to light.     Comments: Right lower eyelid ectropion   Cardiovascular:     Rate and Rhythm: Normal rate and regular rhythm.     Heart sounds: Murmur heard.    Pulmonary:     Breath sounds: Rales present.     Comments: Left basilar rales.  Abdominal:     Palpations: Abdomen is soft.     Tenderness: There is no abdominal tenderness.  Musculoskeletal:     Cervical back: Normal range of motion and neck supple.     Right lower leg: No edema.     Left lower leg: No edema.     Comments: S/p Bil AKA  Skin:    General: Skin is warm and dry.     Comments: R palm ganglion cyst.   Neurological:     General: No focal deficit present.     Mental Status: He is alert and oriented to person, place, and time. Mental status is at baseline.     Comments: Not ambulatory   Psychiatric:        Mood and Affect: Mood normal.        Behavior: Behavior normal.        Thought Content: Thought content normal.        Judgment: Judgment normal.     Labs reviewed: Recent Labs    10/31/19 0000 04/16/20 0000 08/10/20 0000  NA 138 136* 139  K 4.5 4.5 4.7  CL 102 97* 102  CO2 29* 31* 31*  BUN 21 20 24*  CREATININE 1.2 1.2 1.2  CALCIUM 9.1  9.1 9.5 8.8   Recent Labs    10/31/19 0000 04/16/20 0000 08/10/20 0000  AST 14 14 13*  ALT 11 12 12   ALKPHOS 62 65 55  ALBUMIN 3.4* 3.6 3.1*   Recent Labs    10/31/19 0000 04/16/20 0000 08/10/20 0000  WBC 7.8 7.5 8.2  NEUTROABS  --  4,688.00 5,150.00  HGB 13.1* 13.0* 11.5*  HCT 39* 39* 34*  PLT 377 415* 360   Lab Results  Component Value Date  TSH 1.72 10/31/2019   Lab Results  Component Value Date   HGBA1C 7.6 08/10/2020   Lab Results  Component Value Date   CHOL  138 08/10/2020   HDL 43 08/10/2020   LDLCALC 78 08/10/2020   TRIG 83 08/10/2020   CHOLHDL 2.6 08/29/2018    Significant Diagnostic Results in last 30 days:  No results found.  Assessment/Plan Blood loss anemia stable, on Fe, Vit B12.Hgb 11.5 08/09/20   Constipation stable, on Senokot S II prn, prn Bisacodyl 10mg  suppository.   Neuropathic pain Peripheral neuropathy, phantom pain, s/p AKA bilateral, stable, on Gabapentin 100mg  qd, Tylenol prn   Controlled type 2 diabetes mellitus with stage 3 chronic kidney disease, without long-term current use of insulin (HCC) blood sugar is controlled, on Metformin 500mg  bid, Simvastatin 10mg  qd.05/19/19 urine micro albumin 69(30-299). Hgb a1c8.2, Bun/creat 24/1.16 08/09/20   Hyperlipidemia LDL goal <70  takes Simvastatin, LDL 78 08/09/20   Paroxysmal atrial fibrillation (HCC) heart rate is in control, on Metoprolol12.5mg  bid, Eliquis 2.5mg  bid. Last Cardiology 08/28/19   Stroke [facility]) Hx of CVA takes Eliquis, Simvastatin.   S/P AKA (above knee amputation) bilateral (HCC) No skin breakdown, no long ambulating. Uses w/c for mobility.    Family/ staff Communication: plan of care reviewed with the patient and charge nurse.   Labs/tests ordered:  none  Time spend 35 minutes.

## 2020-10-01 NOTE — Assessment & Plan Note (Signed)
stable, on Fe, Vit B12.Hgb 11.5 08/09/20

## 2020-10-01 NOTE — Assessment & Plan Note (Signed)
Peripheral neuropathy, phantom pain, s/p AKA bilateral, stable, on Gabapentin 100mg  qd, Tylenol prn

## 2020-10-01 NOTE — Assessment & Plan Note (Signed)
heart rate is in control, on Metoprolol12.5mg  bid, Eliquis 2.5mg  bid. Last Cardiology 08/28/19

## 2020-10-01 NOTE — Assessment & Plan Note (Signed)
takes Simvastatin, LDL 78 08/09/20

## 2020-10-01 NOTE — Assessment & Plan Note (Signed)
blood sugar is controlled, on Metformin 500mg  bid, Simvastatin 10mg  qd.05/19/19 urine micro albumin 69(30-299). Hgb a1c8.2, Bun/creat 24/1.16 08/09/20

## 2020-10-01 NOTE — Assessment & Plan Note (Signed)
No skin breakdown, no long ambulating. Uses w/c for mobility.

## 2020-10-01 NOTE — Assessment & Plan Note (Signed)
stable, on Senokot S II prn, prn Bisacodyl 10mg  suppository.

## 2020-10-01 NOTE — Assessment & Plan Note (Signed)
Hx of CVA takes Eliquis, Simvastatin.

## 2020-10-29 ENCOUNTER — Encounter: Payer: Self-pay | Admitting: Orthopedic Surgery

## 2020-10-29 ENCOUNTER — Non-Acute Institutional Stay (SKILLED_NURSING_FACILITY): Payer: PPO | Admitting: Orthopedic Surgery

## 2020-10-29 DIAGNOSIS — Z89611 Acquired absence of right leg above knee: Secondary | ICD-10-CM

## 2020-10-29 DIAGNOSIS — M792 Neuralgia and neuritis, unspecified: Secondary | ICD-10-CM

## 2020-10-29 DIAGNOSIS — D500 Iron deficiency anemia secondary to blood loss (chronic): Secondary | ICD-10-CM

## 2020-10-29 DIAGNOSIS — Z89612 Acquired absence of left leg above knee: Secondary | ICD-10-CM

## 2020-10-29 DIAGNOSIS — R058 Other specified cough: Secondary | ICD-10-CM

## 2020-10-29 DIAGNOSIS — E785 Hyperlipidemia, unspecified: Secondary | ICD-10-CM

## 2020-10-29 DIAGNOSIS — E1122 Type 2 diabetes mellitus with diabetic chronic kidney disease: Secondary | ICD-10-CM

## 2020-10-29 DIAGNOSIS — I6300 Cerebral infarction due to thrombosis of unspecified precerebral artery: Secondary | ICD-10-CM

## 2020-10-29 DIAGNOSIS — N183 Chronic kidney disease, stage 3 unspecified: Secondary | ICD-10-CM

## 2020-10-29 DIAGNOSIS — K5900 Constipation, unspecified: Secondary | ICD-10-CM

## 2020-10-29 DIAGNOSIS — I480 Paroxysmal atrial fibrillation: Secondary | ICD-10-CM

## 2020-10-29 NOTE — Progress Notes (Signed)
Location:    [place] Room Number: 21 Place of Service:  SNF ([phone]) Provider:  Windell Moulding, NP  Virgie Dad, MD  Patient Care Team: Virgie Dad, MD as PCP - General (Internal Medicine) Buford Dresser, MD as PCP - Cardiology (Cardiology) Ricard Dillon, MD as Consulting Physician (Internal Medicine)  Extended Emergency Contact Information Primary Emergency Contact: Gordon,[place] Address: [address]          [place], [place] [address] [name] of [place] Phone: [phone] Mobile Phone: [phone] Relation: Daughter  Code Status:  DNR Managed Care Goals of care: Advanced Directive information Advanced Directives 10/29/2020  Does Patient Have a Medical Advance Directive? Yes  Type of Advance Directive Living will;Healthcare Power of [place];Out of facility DNR (pink MOST or yellow form)  Does patient want to make changes to medical advance directive? No - Patient declined  Copy of [place] in Chart? Yes - validated most recent copy scanned in chart (See row information)  Pre-existing out of facility DNR order (yellow form or pink MOST form) Yellow form placed in chart (order not valid for inpatient use)     Chief Complaint  Patient presents with  . Medical Management of Chronic Issues  . Health Maintenance    Foot exam, urine microalbuminm, Eye exam    HPI:  Pt is a 87 y.o. male seen today for medical management of chronic diseases.    He resides on the skilled unit at [facility] due to chronic conditions and history of bilateral AKA. Atrial fibrillation controlled with metoprolol 12.5 mg po bid and Eliquis 2.5 mg po bid. Last seen by cardiology 08/2019, he was advised to follow up in one year. Type 2 diabetes with CKD stage 3 managed with metformin 500 mg po bid and simvastatin 10 mg daily. 07/2020 A1c 7.6, LDL 78. No hypoglycemic events. Recent blood sugars are as follows: 05/09-191, 05/02- 126,  04/25- 147, 04/18- 160. Creatinine remains unchanged at 1.2. Constipation stable with daily senna and prn suppository. Continues to use motorized wheelchair for ambulation due to Bilateral AKA due to PAD. Phantom pain controlled with gabapentin 100 mg and prn tylenol. He has had a chronic cough and raspy voice for awhile. Failed mucinex trial in February 2022. Sputum remains clear and easy to cough up. Cough triggered by a lot of talking. He has requested to not try additional medications at this time. He denies chest pain or sob.   Recent blood pressures are as follows:  05/06- 107/57,126/77  05/05- 108/68, 118/71  05/04- 121/61, 119/71  Recent weights are as follows:  05/03- 114.7 lbs  04/01- 114 lbs  03/18- 114.3 lbs  Nurse does not report any concerns, vitals stable.           Past Medical History:  Diagnosis Date  . BPH (benign prostatic hyperplasia) 10/14/2014  . Cervicalgia 01/04/2016  . Chronic kidney disease   . Contusion of left shoulder 05/27/14  . DM type 2 (diabetes mellitus, type 2) ([place])   . Dysphagia   . High blood pressure   . History of basal cell cancer 11/21/2005   Right temple  . History of colonic polyps 10/14/2003  . Hyperlipidemia   . Left knee pain 05/27/14  . Peripheral neuropathic pain   . Peripheral vascular disease ([place])   . SAH (subarachnoid hemorrhage) ([place]) 2009   TBI  . Stroke ([place])   . TIA (transient ischemic attack) 11/06/2005   Left  eye pain. Slurred speech. Diaphoresis. No residual effects.    Past Surgical History:  Procedure Laterality Date  . ABDOMINAL AORTOGRAM W/LOWER EXTREMITY N/A 06/12/2018   Procedure: ABDOMINAL AORTOGRAM W/LOWER EXTREMITY;  Surgeon: Marty Heck, MD;  Location: [facility] CV LAB;  Service: Cardiovascular;  Laterality: N/A;  . Achilles tendon repair Left 1998   ruptured tendon  . AMPUTATION Left 10/11/2018   Procedure: Left  AMPUTATION ABOVE KNEE;  Surgeon: Marty Heck, MD;  Location: [facility];   Service: Vascular;  Laterality: Left;  . AMPUTATION Right 02/07/2019   Procedure: AMPUTATION ABOVE KNEE;  Surgeon: Rosetta Posner, MD;  Location: [facility];  Service: Vascular;  Laterality: Right;  . COLONOSCOPY  1992   Polyp removed  . COLONOSCOPY  1998   normal  . ORIF FEMUR FRACTURE Right 1929  . PERIPHERAL VASCULAR INTERVENTION Left 06/12/2018   Procedure: PERIPHERAL VASCULAR INTERVENTION;  Surgeon: Marty Heck, MD;  Location: [facility] LAB;  Service: Cardiovascular;  Laterality: Left;  Attempted   . TONSILLECTOMY      Allergies  Allergen Reactions  . Tape Other (See Comments)    TAPE WILL TEAR THE SKIN!!!!    Allergies as of 10/29/2020      Reactions   Tape Other (See Comments)   TAPE WILL TEAR THE SKIN!!!!      Medication List       Accurate as of Oct 29, 2020  9:55 AM. If you have any questions, ask your nurse or doctor.        acetaminophen 650 MG CR tablet Commonly known as: TYLENOL Take 650 mg by mouth every 8 (eight) hours as needed for pain.   apixaban 2.5 MG Tabs tablet Commonly known as: ELIQUIS Take 2.5 mg by mouth 2 (two) times daily. Morning: 7am-11am Evening: 7:30pm-11pm   bisacodyl 10 MG suppository Commonly known as: DULCOLAX Place 10 mg rectally daily as needed for moderate constipation.   Calcium Carbonate-Vitamin D 600-400 MG-UNIT tablet Take 1 tablet by mouth daily. Morning: 7am-11am.   D3-1000 25 MCG (1000 UT) tablet Generic drug: Cholecalciferol Take 1,000 Units by mouth daily. Morning 7am-11am.   gabapentin 100 MG capsule Commonly known as: NEURONTIN Take 100 mg by mouth at bedtime. 7:30pm-11pm.   loratadine 10 MG tablet Commonly known as: CLARITIN Take 10 mg by mouth daily.   metFORMIN 500 MG tablet Commonly known as: GLUCOPHAGE Take 500 mg by mouth 2 (two) times daily with a meal. Morning: 7am-9am Evening: 5pm-7pm.   metoprolol tartrate 25 MG tablet Commonly known as: LOPRESSOR Take 12.5 mg by mouth 2 (two) times  daily. Morning: 7am-11am Evening: 7pm-11pm   Propylene Glycol-Glycerin 1-0.3 % Soln Place 2 drops into both eyes daily as needed (dry eyes).   sennosides-docusate sodium 8.6-50 MG tablet Commonly known as: SENOKOT-S Take 2 tablets by mouth at bedtime as needed.   simvastatin 10 MG tablet Commonly known as: ZOCOR Take 10 mg by mouth daily. Between the hours of 5pm-7pm.   vitamin B-12 500 MCG tablet Commonly known as: CYANOCOBALAMIN Take 500 mcg by mouth daily.   zinc oxide 20 % ointment Apply 1 application topically as needed for irritation. To buttocks after every incontinent episode and as needed for redness. May keep at bedside. As Needed       Review of Systems  Constitutional: Negative for activity change, appetite change, fatigue and fever.  HENT: Positive for rhinorrhea and voice change. Negative for congestion, hearing loss and trouble swallowing.  Eyes: Negative for visual disturbance.       Glasses  Respiratory: Positive for cough. Negative for shortness of breath and wheezing.   Cardiovascular: Negative for chest pain.  Gastrointestinal: Positive for constipation. Negative for abdominal distention, abdominal pain, diarrhea and nausea.  Genitourinary: Negative for dysuria, frequency and hematuria.  Musculoskeletal:       Phantom pain, bilateral AKA  Skin: Negative.   Neurological: Positive for weakness. Negative for dizziness and headaches.  Hematological: Bruises/bleeds easily.  Psychiatric/Behavioral: Negative for dysphoric mood. The patient is not nervous/anxious.     Immunization History  Administered Date(s) Administered  . DTaP 12/31/2012  . Influenza, High Dose Seasonal PF 04/04/2017, 04/10/2019  . Influenza,inj,Quad PF,6+ Mos 03/28/2018  . Influenza-Unspecified 04/10/2014, 03/25/2015, 04/06/2016, 03/30/2020  . Moderna SARS-COV2 Booster Vaccination 05/10/2020  . Moderna Sars-Covid-2 Vaccination 06/30/2019, 07/28/2019  . PPD Test 04/29/2014  .  Pneumococcal Conjugate-13 04/28/2017  . Pneumococcal-Unspecified 03/13/2011  . Td 12/24/2012  . Tdap 04/28/2017  . Zoster Recombinat (Shingrix) 12/14/2017   Pertinent  Health Maintenance Due  Topic Date Due  . FOOT EXAM  06/27/2018  . URINE MICROALBUMIN  03/14/2019  . OPHTHALMOLOGY EXAM  07/01/2020  . INFLUENZA VACCINE  01/24/2021  . HEMOGLOBIN A1C  02/07/2021  . PNA vac Low Risk Adult  Completed   Fall Risk  10/02/2018 09/11/2018 08/28/2018 08/08/2018 08/07/2018  Falls in the past year? 0 0 0 0 0  Number falls in past yr: 0 0 0 - 0  Injury with Fall? 0 0 0 - 0  Risk for fall due to : - - - - -   Functional Status Survey:    Vitals:   10/29/20 0949  BP: 126/77  Pulse: 83  Resp: 17  Temp: (!) 97.3 F (36.3 C)  SpO2: 94%  Weight: 114 lb 11.2 oz (52 kg)  Height: 3\' 8"  (1.118 m)   Body mass index is 41.65 kg/m. Physical Exam Vitals reviewed.  Constitutional:      General: He is not in acute distress. HENT:     Head: Normocephalic.     Right Ear: There is no impacted cerumen.     Left Ear: There is no impacted cerumen.     Nose: Nose normal.     Mouth/Throat:     Mouth: Mucous membranes are moist.     Pharynx: No posterior oropharyngeal erythema.  Eyes:     General:        Right eye: No discharge.        Left eye: No discharge.  Cardiovascular:     Rate and Rhythm: Normal rate. Rhythm irregular.     Pulses: Normal pulses.     Heart sounds: Normal heart sounds. No murmur heard.   Pulmonary:     Effort: Pulmonary effort is normal. No respiratory distress.     Breath sounds: Normal breath sounds. No wheezing.  Abdominal:     General: Bowel sounds are normal. There is no distension.     Palpations: Abdomen is soft.     Tenderness: There is no abdominal tenderness.  Musculoskeletal:     Cervical back: Normal range of motion.     Comments: Bilateral AKA  Lymphadenopathy:     Cervical: No cervical adenopathy.  Skin:    General: Skin is warm and dry.     Capillary  Refill: Capillary refill takes less than 2 seconds.  Neurological:     General: No focal deficit present.     Mental Status: He is  alert and oriented to person, place, and time.     Motor: Weakness present.     Gait: Gait abnormal.     Comments: Motorized wheelchair  Psychiatric:        Mood and Affect: Mood normal.        Behavior: Behavior normal.     Labs reviewed: Recent Labs    10/31/19 0000 04/16/20 0000 08/10/20 0000  NA 138 136* 139  K 4.5 4.5 4.7  CL 102 97* 102  CO2 29* 31* 31*  BUN 21 20 24*  CREATININE 1.2 1.2 1.2  CALCIUM 9.1  9.1 9.5 8.8   Recent Labs    10/31/19 0000 04/16/20 0000 08/10/20 0000  AST 14 14 13*  ALT 11 12 12   ALKPHOS 62 65 55  ALBUMIN 3.4* 3.6 3.1*   Recent Labs    10/31/19 0000 04/16/20 0000 08/10/20 0000  WBC 7.8 7.5 8.2  NEUTROABS  --  4,688.00 5,150.00  HGB 13.1* 13.0* 11.5*  HCT 39* 39* 34*  PLT 377 415* 360   Lab Results  Component Value Date   TSH 1.72 10/31/2019   Lab Results  Component Value Date   HGBA1C 7.6 08/10/2020   Lab Results  Component Value Date   CHOL 138 08/10/2020   HDL 43 08/10/2020   LDLCALC 78 08/10/2020   TRIG 83 08/10/2020   CHOLHDL 2.6 08/29/2018    Significant Diagnostic Results in last 30 days:  No results found.  Assessment/Plan 1. Paroxysmal atrial fibrillation (HCC) - rate controlled with metoprolol - cont Eliquis for clot prevention - needs f/u to cardiology  2. Controlled type 2 diabetes mellitus with stage 3 chronic kidney disease, without long-term current use of insulin (HCC) - no recent hypoglycemia - A1c 7.6 07/2020, creat 1.2 - cont metformin - A1c- future - cmp- future  3. Hyperlipidemia LDL goal <70 - LDL at 78 07/2020 - cont lipitor  4. S/P AKA (above knee amputation) bilateral ([place]) - cont skilled nursing  5. Cough productive of clear sputum - talking triggers cough, does not want medication at this time  6. Constipation, unspecified constipation  type - stable with senna and prn suppository - encourage fluids  7. Blood loss anemia - hgb 11.5 07/2020  8. Cerebrovascular accident (CVA) due to thrombosis of precerebral artery ([place]) - stable with Eliquis and statin  9. Neuropathic pain - phantom pain due to bilateral AKA - cont gabapentin and prn tylenol    Family/ staff Communication: plan discussed with patient and nurse  Labs/tests ordered: cbc/diff

## 2020-10-31 ENCOUNTER — Emergency Department (HOSPITAL_COMMUNITY)
Admission: EM | Admit: 2020-10-31 | Discharge: 2020-10-31 | Disposition: A | Discharge status: Home / Self Care | Payer: PPO | Attending: Emergency Medicine | Admitting: Emergency Medicine

## 2020-10-31 ENCOUNTER — Emergency Department (HOSPITAL_COMMUNITY): Payer: PPO

## 2020-10-31 ENCOUNTER — Other Ambulatory Visit: Payer: Self-pay

## 2020-10-31 DIAGNOSIS — S0990XA Unspecified injury of head, initial encounter: Secondary | ICD-10-CM

## 2020-10-31 DIAGNOSIS — S0181XA Laceration without foreign body of other part of head, initial encounter: Secondary | ICD-10-CM

## 2020-10-31 DIAGNOSIS — M47812 Spondylosis without myelopathy or radiculopathy, cervical region: Secondary | ICD-10-CM | POA: Diagnosis not present

## 2020-10-31 DIAGNOSIS — W050XXA Fall from non-moving wheelchair, initial encounter: Secondary | ICD-10-CM | POA: Insufficient documentation

## 2020-10-31 DIAGNOSIS — W19XXXA Unspecified fall, initial encounter: Secondary | ICD-10-CM | POA: Diagnosis not present

## 2020-10-31 DIAGNOSIS — S0184XA Puncture wound with foreign body of other part of head, initial encounter: Secondary | ICD-10-CM | POA: Diagnosis not present

## 2020-10-31 DIAGNOSIS — R9431 Abnormal electrocardiogram [ECG] [EKG]: Secondary | ICD-10-CM | POA: Diagnosis not present

## 2020-10-31 DIAGNOSIS — I672 Cerebral atherosclerosis: Secondary | ICD-10-CM | POA: Diagnosis not present

## 2020-10-31 DIAGNOSIS — R58 Hemorrhage, not elsewhere classified: Secondary | ICD-10-CM | POA: Diagnosis not present

## 2020-10-31 DIAGNOSIS — G319 Degenerative disease of nervous system, unspecified: Secondary | ICD-10-CM | POA: Diagnosis not present

## 2020-10-31 DIAGNOSIS — R0902 Hypoxemia: Secondary | ICD-10-CM | POA: Diagnosis not present

## 2020-10-31 DIAGNOSIS — J984 Other disorders of lung: Secondary | ICD-10-CM | POA: Diagnosis not present

## 2020-10-31 DIAGNOSIS — S199XXA Unspecified injury of neck, initial encounter: Secondary | ICD-10-CM | POA: Diagnosis not present

## 2020-10-31 DIAGNOSIS — I6523 Occlusion and stenosis of bilateral carotid arteries: Secondary | ICD-10-CM | POA: Diagnosis not present

## 2020-10-31 NOTE — ED Triage Notes (Signed)
Pt BIBA from [facility] as a level 2 trauma. Pt was reaching for a card and fell forward onto the floor striking his head causing a small abrasion to the forehead. Pt on Eliquis. Denies LOC. No pain.

## 2020-10-31 NOTE — Progress Notes (Signed)
   10/31/20 1407  Clinical Encounter Type  Visit Type Trauma  Referral From Nurse  Consult/Referral To Chaplain  Chaplain responded to 87 year old male fall on thinners.  Pt was alert and stable. No family members present, and Chaplain was not needed.     Chaplain Kathie Posa [place] [phone]

## 2020-10-31 NOTE — Discharge Instructions (Addendum)
There were no serious injuries found after your fall.  For pain use Tylenol.  We fixed the wound of your forehead with tissue adhesive, glue, which will fall off in 4 to 5 days.  It can help to use ice on the sore area 3-4 times a day for 2 days.  Return here, as needed, for problems.

## 2020-10-31 NOTE — ED Provider Notes (Signed)
[facility] Provider Note   CSN: [id number] Arrival date & time: 10/31/20  1412     History Chief Complaint  Patient presents with  . Fall    Nathan Cook is a 87 y.o. male.  HPI A presents for evaluation of injuries from fall.  He states he was reaching for something when he fell forward.  He has a bilateral amputee.  He denies only injury to his head and requests only a Band-Aid.  There is no reported loss of consciousness.  He denies pain in his neck or back.  He was transferred here by EMS for evaluation.  He takes oral anticoagulant.  There are no other known active modifying factors.    No past medical history on file.  There are no problems to display for this patient.   Past Surgical History:  Procedure Laterality Date  . ABDOMINAL AORTOGRAM W/LOWER EXTREMITY N/A 06/12/2018   Procedure: ABDOMINAL AORTOGRAM W/LOWER EXTREMITY;  Surgeon: Marty Heck, MD;  Location: [facility];  Service: Cardiovascular;  Laterality: N/A;  . Achilles tendon repair Left 1998   ruptured tendon  . AMPUTATION Left 10/11/2018   Procedure: Left  AMPUTATION ABOVE KNEE;  Surgeon: Marty Heck, MD;  Location: [facility];  Service: Vascular;  Laterality: Left;  . AMPUTATION Right 02/07/2019   Procedure: AMPUTATION ABOVE KNEE;  Surgeon: Rosetta Posner, MD;  Location: [facility];  Service: Vascular;  Laterality: Right;  . COLONOSCOPY  1992   Polyp removed  . COLONOSCOPY  1998   normal  . ORIF FEMUR FRACTURE Right 1929  . PERIPHERAL VASCULAR INTERVENTION Left 06/12/2018   Procedure: PERIPHERAL VASCULAR INTERVENTION;  Surgeon: Marty Heck, MD;  Location: [facility];  Service: Cardiovascular;  Laterality: Left;  Attempted   . TONSILLECTOMY         No family history on file.     Home Medications Prior to Admission medications   Not on File    Allergies    Patient has no allergy information on record.  Review of  Systems   Review of Systems  All other systems reviewed and are negative.   Physical Exam Updated Vital Signs BP 128/67   Pulse 80   Temp 97.8 F (36.6 C) (Oral)   Resp (!) 34   Ht 4\' 10"  (1.473 m)   Wt 81.6 kg   SpO2 91%   BMI 37.62 kg/m   Physical Exam Vitals and nursing note reviewed.  Constitutional:      Appearance: He is well-developed.  HENT:     Head: Normocephalic.     Comments: Small abrasion and contusion left forehead.    Right Ear: External ear normal.     Left Ear: External ear normal.     Mouth/Throat:     Mouth: Mucous membranes are moist.     Pharynx: No posterior oropharyngeal erythema.  Eyes:     Conjunctiva/sclera: Conjunctivae normal.     Pupils: Pupils are equal, round, and reactive to light.  Neck:     Trachea: Phonation normal.  Cardiovascular:     Rate and Rhythm: Normal rate and regular rhythm.     Heart sounds: Normal heart sounds.  Pulmonary:     Effort: Pulmonary effort is normal.     Breath sounds: Normal breath sounds.  Abdominal:     Palpations: Abdomen is soft.     Tenderness: There is no abdominal tenderness.  Musculoskeletal:  General: Normal range of motion.     Cervical back: Normal range of motion and neck supple.     Comments: Bilateral AKI, otherwise no deformities or tenderness.  Skin:    General: Skin is warm and dry.  Neurological:     Mental Status: He is alert and oriented to person, place, and time.     Cranial Nerves: No cranial nerve deficit.     Sensory: No sensory deficit.     Motor: No abnormal muscle tone.     Coordination: Coordination normal.  Psychiatric:        Mood and Affect: Mood normal.        Behavior: Behavior normal.        Thought Content: Thought content normal.        Judgment: Judgment normal.     ED Results / Procedures / Treatments   Labs (all labs ordered are listed, but only abnormal results are displayed) Labs Reviewed - No data to display  EKG None  Radiology No  results found.  Procedures .Marland KitchenLaceration Repair  Date/Time: 10/31/2020 6:06 PM Performed by: Daleen Bo, MD Authorized by: Daleen Bo, MD   Consent:    Consent obtained:  Verbal   Risks discussed:  Infection, pain, poor cosmetic result and need for additional repair Universal protocol:    Immediately prior to procedure, a time out was called: yes     Patient identity confirmed:  Verbally with patient Anesthesia:    Anesthesia method:  None Laceration details:    Location:  Face   Face location:  Forehead   Length (cm):  3.5   Depth (mm):  9 Exploration:    Limited defect created (wound extended): yes     Hemostasis achieved with:  Direct pressure   Wound exploration: wound explored through full range of motion     Wound extent: no areolar tissue violation noted, no fascia violation noted, no foreign bodies/material noted, no muscle damage noted and no vascular damage noted     Contaminated: no   Treatment:    Area cleansed with:  Saline   Amount of cleaning:  Standard   Debridement:  None   Undermining:  None Skin repair:    Repair method:  Tissue adhesive Approximation:    Approximation:  Loose Repair type:    Repair type:  Simple     Medications Ordered in ED Medications - No data to display  ED Course  I have reviewed the triage vital signs and the nursing notes.  Pertinent labs & imaging results that were available during my care of the patient were reviewed by me and considered in my medical decision making (see chart for details).    MDM Rules/Calculators/A&P                           Patient Vitals for the past 24 hrs:  BP Temp Temp src Pulse Resp SpO2 Height Weight  10/31/20 1515 128/67 -- -- 80 (!) 34 91 % -- --  10/31/20 1407 136/81 97.8 F (36.6 C) Oral 80 20 98 % 4\' 10"  (1.473 m) 81.6 kg    At time of discharge- reevaluation with update and discussion. After initial assessment and treatment, an updated evaluation reveals he is comfortable  and has no further complaints. Daleen Bo   Medical Decision Making:  This patient is presenting for evaluation of fall from wheelchair, which does require a range of treatment options, and is a  complaint that involves a moderate risk of morbidity and mortality. The differential diagnoses include head contusion, intracranial injury, cervical spine injury, facial injury. I decided to review old records, and in summary elderly man with bilateral AKA, got off balance when he leaned forward and fell out of his wheelchair..  I obtained additional historical information from daughter at bedside.   Radiologic Tests Ordered, included CT cervical spine, CT head.  I independently Visualized: Radiographic images, which show no acute injuries    Critical Interventions-clinical evaluation, radiography, laceration repair, observation, reassessment  After These Interventions, the Patient was reevaluated and was found stable.  No indication for intracranial injury or spinal injury.  Facial laceration repaired with tissue adhesive.  CRITICAL CARE-no Performed by: Daleen Bo  Nursing Notes Reviewed/ Care Coordinated Applicable Imaging Reviewed Interpretation of Laboratory Data incorporated into ED treatment  The patient appears reasonably screened and/or stabilized for discharge and I doubt any other medical condition or other [facility] requiring further screening, evaluation, or treatment in the ED at this time prior to discharge.  Plan: Home Medications-routine medications; Home Treatments-wound care; return here if the recommended treatment, does not improve the symptoms; Recommended follow up-PCP, as needed     Final Clinical Impression(s) / ED Diagnoses Final diagnoses:  None    Rx / DC Orders ED Discharge Orders    None       Daleen Bo, MD 11/02/20 1252

## 2020-10-31 NOTE — ED Notes (Signed)
Pt given turkey sandwich and apple juice

## 2020-10-31 NOTE — ED Notes (Signed)
TRN Note:  When rounding on pt, pt asked when he was going to CT scan- stating "I am just sitting here waiting" pt is alert/oriented x 4- lac to forehead, not bleeding at this time.  Dr. Eulis Foster made aware for orders.   Rolene Arbour, RN  Trauma Response Nurse

## 2020-11-03 ENCOUNTER — Encounter: Payer: Self-pay | Admitting: Orthopedic Surgery

## 2020-11-03 ENCOUNTER — Non-Acute Institutional Stay (SKILLED_NURSING_FACILITY): Payer: PPO | Admitting: Orthopedic Surgery

## 2020-11-03 DIAGNOSIS — Z89612 Acquired absence of left leg above knee: Secondary | ICD-10-CM | POA: Diagnosis not present

## 2020-11-03 DIAGNOSIS — Z89611 Acquired absence of right leg above knee: Secondary | ICD-10-CM

## 2020-11-03 DIAGNOSIS — E1122 Type 2 diabetes mellitus with diabetic chronic kidney disease: Secondary | ICD-10-CM

## 2020-11-03 DIAGNOSIS — U071 COVID-19: Secondary | ICD-10-CM

## 2020-11-03 DIAGNOSIS — N183 Chronic kidney disease, stage 3 unspecified: Secondary | ICD-10-CM | POA: Diagnosis not present

## 2020-11-03 DIAGNOSIS — I480 Paroxysmal atrial fibrillation: Secondary | ICD-10-CM | POA: Diagnosis not present

## 2020-11-03 DIAGNOSIS — S0081XD Abrasion of other part of head, subsequent encounter: Secondary | ICD-10-CM | POA: Diagnosis not present

## 2020-11-03 DIAGNOSIS — R059 Cough, unspecified: Secondary | ICD-10-CM | POA: Diagnosis not present

## 2020-11-03 DIAGNOSIS — K5900 Constipation, unspecified: Secondary | ICD-10-CM | POA: Diagnosis not present

## 2020-11-03 DIAGNOSIS — E785 Hyperlipidemia, unspecified: Secondary | ICD-10-CM

## 2020-11-03 NOTE — Progress Notes (Signed)
Location:   [place] Room Number: 21-A Place of Service:  SNF [phone]) Provider:  Windell Moulding, NP    Patient Care Team: Virgie Dad, MD as PCP - General (Internal Medicine) Buford Dresser, MD as PCP - Cardiology (Cardiology) Ricard Dillon, MD as Consulting Physician (Internal Medicine) Virgie Dad, MD (Internal Medicine)  Extended Emergency Contact Information Primary Emergency Contact: Gordon,[place] Address: [address]          [place],  [address] [name] of [place] Phone: [phone] Mobile Phone: [phone] Relation: Daughter Secondary Emergency Contact: [place] Mobile Phone: [phone] Relation: Daughter  Code Status:  DNR  Goals of care: Advanced Directive information Advanced Directives 11/03/2020  Does Patient Have a Medical Advance Directive? Yes  Type of Paramedic of [place];Living will;Out of facility DNR (pink MOST or yellow form)  Does patient want to make changes to medical advance directive? No - Patient declined  Copy of [place] in Chart? Yes - validated most recent copy scanned in chart (See row information)  Pre-existing out of facility DNR order (yellow form or pink MOST form) -     Chief Complaint  Patient presents with  . Acute Visit    Covid, nasal congestion, cough, and heartburn.    HPI:  Pt is a 87 y.o. male seen today for an acute visit for congestion related to covid-19.   He resides on the skilled unit at [facility] due to chronic conditions and history of bilateral AKA.   05/08 he was reaching for something and fell forward and hit his head. He was taken to [facility] to evaluation. He had a small abrasion and contusion to left forehead. Wound closed with tissue adhesive. CT head/spine confirmed left supraorbital swelling, no fracture. Chronic microvascular ischemia noted. Degenerative changes to spine noted, no acute  fracture or traumatic subluxation of spine found.   05/09 he was found to be covid-19 positive. He remained asymptomatic. Vitamin C, vitamin D and zinc started. Today, he reports nasal congestion, sneezing and feeling fatigued. He has a history of chronic cough with sputum production. Denies his cough has worsened. Voice remains raspy.   Atrial fibrillation controlled with metoprolol, given Eliquis bid for clot prevention.  Diabetes with CKD stage 3 managed with metformin 500 mg bid and daily simvastatin. Morning sugars ranging from 120-190's. Constipation stable with daily senna and prn suppository. Phantom pain managed with gabapentin and prn tylenol.   Past Medical History:  Diagnosis Date  . BPH (benign prostatic hyperplasia) 10/14/2014  . Cervicalgia 01/04/2016  . Chronic kidney disease   . Contusion of left shoulder 05/27/14  . DM type 2 (diabetes mellitus, type 2) ([place])   . Dysphagia   . High blood pressure   . History of basal cell cancer 11/21/2005   Right temple  . History of colonic polyps 10/14/2003  . Hyperlipidemia   . Left knee pain 05/27/14  . Peripheral neuropathic pain   . Peripheral vascular disease ([place])   . SAH (subarachnoid hemorrhage) ([place]) 2009   TBI  . Stroke ([place])   . TIA (transient ischemic attack) 11/06/2005   Left eye pain. Slurred speech. Diaphoresis. No residual effects.    Past Surgical History:  Procedure Laterality Date  . ABDOMINAL AORTOGRAM W/LOWER EXTREMITY N/A 06/12/2018   Procedure: ABDOMINAL AORTOGRAM W/LOWER EXTREMITY;  Surgeon: Marty Heck, MD;  Location: [facility];  Service: Cardiovascular;  Laterality: N/A;  . Achilles  tendon repair Left 1998   ruptured tendon  . AMPUTATION Left 10/11/2018   Procedure: Left  AMPUTATION ABOVE KNEE;  Surgeon: Marty Heck, MD;  Location: [place];  Service: Vascular;  Laterality: Left;  . AMPUTATION Right 02/07/2019   Procedure: AMPUTATION ABOVE KNEE;  Surgeon: Rosetta Posner, MD;   Location: [place];  Service: Vascular;  Laterality: Right;  . COLONOSCOPY  1992   Polyp removed  . COLONOSCOPY  1998   normal  . ORIF FEMUR FRACTURE Right 1929  . PERIPHERAL VASCULAR INTERVENTION Left 06/12/2018   Procedure: PERIPHERAL VASCULAR INTERVENTION;  Surgeon: Marty Heck, MD;  Location: [facility];  Service: Cardiovascular;  Laterality: Left;  Attempted   . TONSILLECTOMY      Allergies  Allergen Reactions  . Tape Other (See Comments)    TAPE WILL TEAR THE SKIN!!!!    Allergies as of 11/03/2020      Reactions   Tape Other (See Comments)   TAPE WILL TEAR THE SKIN!!!!      Medication List       Accurate as of Nov 03, 2020  3:12 PM. If you have any questions, ask your nurse or doctor.        acetaminophen 650 MG CR tablet Commonly known as: TYLENOL Take 650 mg by mouth every 8 (eight) hours as needed for pain.   apixaban 2.5 MG Tabs tablet Commonly known as: ELIQUIS Take 2.5 mg by mouth 2 (two) times daily. Morning: 7am-11am Evening: 7:30pm-11pm   bisacodyl 10 MG suppository Commonly known as: DULCOLAX Place 10 mg rectally daily as needed for moderate constipation.   calcium carbonate 500 MG chewable tablet Commonly known as: TUMS - dosed in mg elemental calcium Chew 1 tablet by mouth 2 (two) times daily.   Calcium Carbonate-Vitamin D 600-400 MG-UNIT tablet Take 1 tablet by mouth daily. Morning: 7am-11am.   D3-1000 25 MCG (1000 UT) tablet Generic drug: Cholecalciferol Take 1,000 Units by mouth daily. Morning 7am-11am.   Vitamin D3 50 MCG (2000 UT) Tabs Take 1 tablet by mouth daily.   gabapentin 100 MG capsule Commonly known as: NEURONTIN Take 100 mg by mouth at bedtime. 7:30pm-11pm.   loratadine 10 MG tablet Commonly known as: CLARITIN Take 10 mg by mouth daily.   metFORMIN 500 MG tablet Commonly known as: GLUCOPHAGE Take 500 mg by mouth 2 (two) times daily with a meal. Morning: 7am-9am Evening: 5pm-7pm.   metoprolol tartrate 25  MG tablet Commonly known as: LOPRESSOR Take 12.5 mg by mouth 2 (two) times daily. Morning: 7am-11am Evening: 7pm-11pm   Propylene Glycol-Glycerin 1-0.3 % Soln Place 2 drops into both eyes daily as needed (dry eyes).   sennosides-docusate sodium 8.6-50 MG tablet Commonly known as: SENOKOT-S Take 2 tablets by mouth at bedtime as needed.   simvastatin 10 MG tablet Commonly known as: ZOCOR Take 10 mg by mouth daily. Between the hours of 5pm-7pm.   vitamin B-12 500 MCG tablet Commonly known as: CYANOCOBALAMIN Take 500 mcg by mouth daily.   zinc oxide 20 % ointment Apply 1 application topically as needed for irritation. To buttocks after every incontinent episode and as needed for redness. May keep at bedside. As Needed       Review of Systems  Constitutional: Positive for fatigue. Negative for activity change, appetite change, chills and fever.  HENT: Positive for congestion, rhinorrhea, sneezing and sore throat. Negative for trouble swallowing.   Respiratory: Positive for cough. Negative for shortness of breath and wheezing.  Cardiovascular: Negative for chest pain.  Gastrointestinal: Positive for constipation. Negative for abdominal distention, abdominal pain, diarrhea and nausea.  Genitourinary: Negative for dysuria and frequency.  Musculoskeletal: Positive for gait problem.       Phantom pain, bilateral AKA  Skin: Negative.   Neurological: Positive for weakness. Negative for dizziness and headaches.  Psychiatric/Behavioral: Positive for dysphoric mood. The patient is not nervous/anxious.     Immunization History  Administered Date(s) Administered  . DTaP 12/31/2012  . Influenza, High Dose Seasonal PF 04/04/2017, 04/10/2019  . Influenza,inj,Quad PF,6+ Mos 03/28/2018  . Influenza-Unspecified 04/10/2014, 03/25/2015, 04/06/2016, 03/30/2020  . Moderna SARS-COV2 Booster Vaccination 05/10/2020  . Moderna Sars-Covid-2 Vaccination 06/30/2019, 07/28/2019  . PPD Test 04/29/2014   . Pneumococcal Conjugate-13 04/28/2017  . Pneumococcal-Unspecified 03/13/2011  . Td 12/24/2012  . Tdap 04/28/2017  . Zoster Recombinat (Shingrix) 12/14/2017   Pertinent  Health Maintenance Due  Topic Date Due  . FOOT EXAM  06/27/2018  . URINE MICROALBUMIN  03/14/2019  . OPHTHALMOLOGY EXAM  07/01/2020  . INFLUENZA VACCINE  01/24/2021  . HEMOGLOBIN A1C  02/07/2021  . PNA vac Low Risk Adult  Completed   Fall Risk  10/02/2018 09/11/2018 08/28/2018 08/08/2018 08/07/2018  Falls in the past year? 0 0 0 0 0  Number falls in past yr: 0 0 0 - 0  Injury with Fall? 0 0 0 - 0  Risk for fall due to : - - - - -   Functional Status Survey:    Vitals:   11/03/20 1450  BP: (!) 123/59  Pulse: 79  Resp: 20  Temp: (!) 96.9 F (36.1 C)  SpO2: 95%  Weight: 114 lb 11.2 oz (52 kg)  Height: 4\' 10"  (1.473 m)   Body mass index is 23.97 kg/m. Physical Exam Vitals reviewed.  Constitutional:      General: He is not in acute distress. HENT:     Head: Normocephalic.      Right Ear: There is no impacted cerumen.     Left Ear: There is no impacted cerumen.     Nose: Congestion present.     Right Turbinates: Enlarged.     Left Turbinates: Enlarged.     Mouth/Throat:     Mouth: Mucous membranes are moist.     Pharynx: No posterior oropharyngeal erythema.  Cardiovascular:     Rate and Rhythm: Normal rate. Rhythm irregular.     Pulses: Normal pulses.     Heart sounds: Normal heart sounds. No murmur heard.   Pulmonary:     Effort: Pulmonary effort is normal. No respiratory distress.     Breath sounds: Normal breath sounds. No wheezing.     Comments: Spitting cup on bedside table, tan in color Abdominal:     General: Bowel sounds are normal. There is no distension.     Palpations: Abdomen is soft.     Tenderness: There is no abdominal tenderness.  Musculoskeletal:     Cervical back: Normal range of motion.  Lymphadenopathy:     Cervical: No cervical adenopathy.  Skin:    General: Skin is  warm and dry.     Capillary Refill: Capillary refill takes less than 2 seconds.  Neurological:     General: No focal deficit present.     Mental Status: He is alert and oriented to person, place, and time.     Motor: Weakness present.     Gait: Gait abnormal.     Comments: Motorized wheelchair  Psychiatric:  Mood and Affect: Mood normal.        Behavior: Behavior normal.     Labs reviewed: Recent Labs    04/16/20 0000 08/10/20 0000  NA 136* 139  K 4.5 4.7  CL 97* 102  CO2 31* 31*  BUN 20 24*  CREATININE 1.2 1.2  CALCIUM 9.5 8.8   Recent Labs    04/16/20 0000 08/10/20 0000  AST 14 13*  ALT 12 12  ALKPHOS 65 55  ALBUMIN 3.6 3.1*   Recent Labs    04/16/20 0000 08/10/20 0000  WBC 7.5 8.2  NEUTROABS 4,688.00 5,150.00  HGB 13.0* 11.5*  HCT 39* 34*  PLT 415* 360   Lab Results  Component Value Date   TSH 1.72 10/31/2019   Lab Results  Component Value Date   HGBA1C 7.6 08/10/2020   Lab Results  Component Value Date   CHOL 138 08/10/2020   HDL 43 08/10/2020   LDLCALC 78 08/10/2020   TRIG 83 08/10/2020   CHOLHDL 2.6 08/29/2018    Significant Diagnostic Results in last 30 days:  CT Head Wo Contrast  Result Date: 10/31/2020 CLINICAL DATA:  Fall.  Laceration of forehead. EXAM: CT HEAD WITHOUT CONTRAST CT CERVICAL SPINE WITHOUT CONTRAST TECHNIQUE: Multidetector CT imaging of the head and cervical spine was performed following the standard protocol without intravenous contrast. Multiplanar CT image reconstructions of the cervical spine were also generated. COMPARISON:  None. CT head without contrast 04/09/2018 FINDINGS: CT HEAD FINDINGS Brain: Moderate atrophy and white matter changes are stable. No acute infarct, hemorrhage, or mass lesion is present. The ventricles are proportionate to the degree of atrophy. No significant extraaxial fluid collection is present. The brainstem and cerebellum are within normal limits. Vascular: Atherosclerotic calcifications are  present within the cavernous internal carotid arteries and at the dural margin of the vertebral arteries bilaterally. No hyperdense vessel is present. Skull: Left supraorbital scalp soft tissue swelling is present without underlying fracture. Calvarium is intact. Sinuses/Orbits: The left frontal sinus is opacified. Anterior left ethmoid air cells are opacified as well. Chronic mucosal thickening is present in the left maxillary sinus. Chronic wall thickening noted. Shrunken sinus evident. Nasal cavity is clear. The globes and orbits are within normal limits. CT CERVICAL SPINE FINDINGS Alignment: No significant listhesis is present cervical spine. Straightening of the normal cervical lordosis is noted. Skull base and vertebrae: Soft tissue prominence at the C1-2 level likely reflects inflammatory arthritis. Soft tissues and spinal canal: No prevertebral fluid or swelling. No visible canal hematoma. Disc levels: Acquired fusion present at C4-5. Lateral fusion at the disc levels of C2-3 and C3-4 noted as well. Chronic endplate changes are present C5-6, C6-7. Fusion present at C7-T1. Osseous foraminal narrowing is greatest at C3-4 and C5-6, right greater than left. Multilevel facet degenerative changes are present. Upper chest: Scarring is present both lung apices. No nodule or mass lesion is present. Thoracic inlet is within normal limits. IMPRESSION: 1. Left supraorbital scalp soft tissue swelling without underlying fracture. 2. Stable atrophy and white matter disease. This likely reflects the sequela of chronic microvascular ischemia. 3. No acute intracranial abnormality or significant interval change. 4. Multilevel degenerative changes of the cervical spine as described. 5. No acute fracture or traumatic subluxation in the cervical spine. Electronically Signed   By: San Morelle M.D.   On: 10/31/2020 15:54   CT Cervical Spine Wo Contrast  Result Date: 10/31/2020 CLINICAL DATA:  Fall.  Laceration of  forehead. EXAM: CT HEAD WITHOUT CONTRAST CT  CERVICAL SPINE WITHOUT CONTRAST TECHNIQUE: Multidetector CT imaging of the head and cervical spine was performed following the standard protocol without intravenous contrast. Multiplanar CT image reconstructions of the cervical spine were also generated. COMPARISON:  None. CT head without contrast 04/09/2018 FINDINGS: CT HEAD FINDINGS Brain: Moderate atrophy and white matter changes are stable. No acute infarct, hemorrhage, or mass lesion is present. The ventricles are proportionate to the degree of atrophy. No significant extraaxial fluid collection is present. The brainstem and cerebellum are within normal limits. Vascular: Atherosclerotic calcifications are present within the cavernous internal carotid arteries and at the dural margin of the vertebral arteries bilaterally. No hyperdense vessel is present. Skull: Left supraorbital scalp soft tissue swelling is present without underlying fracture. Calvarium is intact. Sinuses/Orbits: The left frontal sinus is opacified. Anterior left ethmoid air cells are opacified as well. Chronic mucosal thickening is present in the left maxillary sinus. Chronic wall thickening noted. Shrunken sinus evident. Nasal cavity is clear. The globes and orbits are within normal limits. CT CERVICAL SPINE FINDINGS Alignment: No significant listhesis is present cervical spine. Straightening of the normal cervical lordosis is noted. Skull base and vertebrae: Soft tissue prominence at the C1-2 level likely reflects inflammatory arthritis. Soft tissues and spinal canal: No prevertebral fluid or swelling. No visible canal hematoma. Disc levels: Acquired fusion present at C4-5. Lateral fusion at the disc levels of C2-3 and C3-4 noted as well. Chronic endplate changes are present C5-6, C6-7. Fusion present at C7-T1. Osseous foraminal narrowing is greatest at C3-4 and C5-6, right greater than left. Multilevel facet degenerative changes are present. Upper  chest: Scarring is present both lung apices. No nodule or mass lesion is present. Thoracic inlet is within normal limits. IMPRESSION: 1. Left supraorbital scalp soft tissue swelling without underlying fracture. 2. Stable atrophy and white matter disease. This likely reflects the sequela of chronic microvascular ischemia. 3. No acute intracranial abnormality or significant interval change. 4. Multilevel degenerative changes of the cervical spine as described. 5. No acute fracture or traumatic subluxation in the cervical spine. Electronically Signed   By: San Morelle M.D.   On: 10/31/2020 15:54    Assessment/Plan 1. COVID-19 - 05/09 tested positive - remains afebrile, nasal congestion, coughing tan sputum (use to be clear), lung sounds clear in all lung fields - paxlovid- pharmacy to dose - cont vitamin C, vitamin D, and zinc  2. Cough - same as above - he has a chronic cough, but changes to sputum color in past 2 days - mucinex 600 mg po bid x 10 days  3. Paroxysmal atrial fibrillation (HCC) - rate controlled with metoprolol - cont eliquis for clot prevention  4. Controlled type 2 diabetes mellitus with stage 3 chronic kidney disease, without long-term current use of insulin (HCC) - blood sugars controlled - cont metformin  - A1c- future - cmp- future  5. Hyperlipidemia LDL goal <70 - last LDL 78 - cont lipitor  6. S/P AKA (above knee amputation) bilateral ([place]) - cont skilled nursing needs  7. Constipation, unspecified constipation type - stable wit daily senna and prn suppository  8. Abrasion of forehead, subsequent encounter - due to fall - CT head negative for fracture - remains free of infection, swelling has reduced   Family/ staff Communication: plan discussed with patient and nurse  Labs/tests ordered:  none

## 2020-11-04 DIAGNOSIS — E1122 Type 2 diabetes mellitus with diabetic chronic kidney disease: Secondary | ICD-10-CM | POA: Diagnosis not present

## 2020-11-04 DIAGNOSIS — M6281 Muscle weakness (generalized): Secondary | ICD-10-CM | POA: Diagnosis not present

## 2020-11-04 LAB — CBC AND DIFFERENTIAL
HCT: 37 — AB (ref 41–53)
Hemoglobin: 12.2 — AB (ref 13.5–17.5)
Neutrophils Absolute: 2390
Platelets: 320 (ref 150–399)
WBC: 4.8

## 2020-11-04 LAB — CBC: RBC: 3.67 — AB (ref 3.87–5.11)

## 2020-11-25 DIAGNOSIS — L821 Other seborrheic keratosis: Secondary | ICD-10-CM | POA: Diagnosis not present

## 2020-11-25 DIAGNOSIS — L218 Other seborrheic dermatitis: Secondary | ICD-10-CM | POA: Diagnosis not present

## 2020-11-25 DIAGNOSIS — L570 Actinic keratosis: Secondary | ICD-10-CM | POA: Diagnosis not present

## 2020-11-25 DIAGNOSIS — L814 Other melanin hyperpigmentation: Secondary | ICD-10-CM | POA: Diagnosis not present

## 2020-11-25 DIAGNOSIS — L853 Xerosis cutis: Secondary | ICD-10-CM | POA: Diagnosis not present

## 2020-12-17 ENCOUNTER — Non-Acute Institutional Stay (SKILLED_NURSING_FACILITY): Payer: PPO | Admitting: Orthopedic Surgery

## 2020-12-17 DIAGNOSIS — M792 Neuralgia and neuritis, unspecified: Secondary | ICD-10-CM

## 2020-12-17 DIAGNOSIS — B372 Candidiasis of skin and nail: Secondary | ICD-10-CM | POA: Diagnosis not present

## 2020-12-17 DIAGNOSIS — I480 Paroxysmal atrial fibrillation: Secondary | ICD-10-CM

## 2020-12-17 DIAGNOSIS — E1122 Type 2 diabetes mellitus with diabetic chronic kidney disease: Secondary | ICD-10-CM | POA: Diagnosis not present

## 2020-12-17 DIAGNOSIS — Z89611 Acquired absence of right leg above knee: Secondary | ICD-10-CM | POA: Diagnosis not present

## 2020-12-17 DIAGNOSIS — Z89612 Acquired absence of left leg above knee: Secondary | ICD-10-CM

## 2020-12-17 DIAGNOSIS — N183 Chronic kidney disease, stage 3 unspecified: Secondary | ICD-10-CM | POA: Diagnosis not present

## 2020-12-17 DIAGNOSIS — R079 Chest pain, unspecified: Secondary | ICD-10-CM

## 2020-12-17 DIAGNOSIS — E785 Hyperlipidemia, unspecified: Secondary | ICD-10-CM

## 2020-12-17 DIAGNOSIS — R058 Other specified cough: Secondary | ICD-10-CM

## 2020-12-17 MED ORDER — NYSTATIN 100000 UNIT/GM EX OINT: 1.0000 "application " | TOPICAL_OINTMENT | Freq: Three times a day (TID) | CUTANEOUS | 0 refills | Status: DC

## 2020-12-17 NOTE — Progress Notes (Signed)
Location:  [facility] Room Number: 21 Place of Service:  SNF [phone]) Provider:  Windell Moulding, Garnet Koyanagi, MD  Patient Care Team: Virgie Dad, MD as PCP - General (Internal Medicine) Buford Dresser, MD as PCP - Cardiology (Cardiology) Ricard Dillon, MD as Consulting Physician (Internal Medicine) Virgie Dad, MD (Internal Medicine)  Extended Emergency Contact Information Primary Emergency Contact: Gordon,[place] Address: [address]          [place], [place] [address] [name] of [place] Phone: [phone] Mobile Phone: [phone] Relation: Daughter Secondary Emergency Contact: Cornelia Mobile Phone: [phone] Relation: Daughter  Code Status:  DNR Goals of care: Advanced Directive information Advanced Directives 11/03/2020  Does Patient Have a Medical Advance Directive? Yes  Type of Paramedic of [place];Living will;Out of facility DNR (pink MOST or yellow form)  Does patient want to make changes to medical advance directive? No - Patient declined  Copy of [place] in Chart? Yes - validated most recent copy scanned in chart (See row information)  Pre-existing out of facility DNR order (yellow form or pink MOST form) -     Chief Complaint  Patient presents with   Medical Management of Chronic Issues    HPI:  Pt is a 87 y.o. male seen today for medical management of chronic diseases.    He resides on the skilled nursing unit at [facility] due to chronic conditions and bilateral AKA. Past medical history includes: hypertension, PAD, atrial fibrillation, stroke, dysphagia, tyle II diabetes, Chronic kidney disease stage III, constipation, and neuropathic pain.   Chest pain- incident occurred 12/04/2020, he refused to be evaluated by the ED, no other incidents, refuses chest pain today.  Atrial fibrillation- rate controlled with metoprolol 12.5 mg bid, Eliquis  2.5 mg bid for clot prevention Type II diabetes- A1c 7.6 08/10/2020, metformin 500 mg bid, no recent hypoglycemic events.  CKD stage III- BUN/creat 24/1.2 08/10/2020 Constipation- stable with prn senna Phantom pain- denies increased pain, gabapentin 100 mg qhs Chronic cough- ongoing, tan/brown sputum, denies sob or changes in cough Skin- rash spreading over periarea and scrotum, reports burning and itching, rash unresolved with zinc oxide cream  No recent falls, injuries or behavioral outbursts  Recent blood pressures:  06/24- 125/60, 111/61, 111/65  06/23- 108/56, 116/62, 104/58  Recent weights:  06/01- 113.6 lbs  05/03- 114.7 lbs  04/01- 114 lbs   Blood sugars ranging 120-140's.   Nurse does not report any concerns, vitals stable.      Past Medical History:  Diagnosis Date   BPH (benign prostatic hyperplasia) 10/14/2014   Cervicalgia 01/04/2016   Chronic kidney disease    Contusion of left shoulder 05/27/14   DM type 2 (diabetes mellitus, type 2) ([place])    Dysphagia    High blood pressure    History of basal cell cancer 11/21/2005   Right temple   History of colonic polyps 10/14/2003   Hyperlipidemia    Left knee pain 05/27/14   Peripheral neuropathic pain    Peripheral vascular disease ([place])    SAH (subarachnoid hemorrhage) ([place]) 2009   TBI   Stroke [facility])    TIA (transient ischemic attack) 11/06/2005   Left eye pain. Slurred speech. Diaphoresis. No residual effects.    Past Surgical History:  Procedure Laterality Date   ABDOMINAL AORTOGRAM W/LOWER EXTREMITY N/A 06/12/2018   Procedure: ABDOMINAL AORTOGRAM W/LOWER EXTREMITY;  Surgeon: Marty Heck, MD;  Location: [facility]  INVASIVE CV LAB;  Service: Cardiovascular;  Laterality: N/A;   Achilles tendon repair Left 1998   ruptured tendon   AMPUTATION Left 10/11/2018   Procedure: Left  AMPUTATION ABOVE KNEE;  Surgeon: Marty Heck, MD;  Location: [facility];  Service: Vascular;  Laterality: Left;   AMPUTATION Right  02/07/2019   Procedure: AMPUTATION ABOVE KNEE;  Surgeon: Rosetta Posner, MD;  Location: [facility];  Service: Vascular;  Laterality: Right;   COLONOSCOPY  1992   Polyp removed   COLONOSCOPY  1998   normal   ORIF FEMUR FRACTURE Right 1929   PERIPHERAL VASCULAR INTERVENTION Left 06/12/2018   Procedure: PERIPHERAL VASCULAR INTERVENTION;  Surgeon: Marty Heck, MD;  Location: [facility] CV LAB;  Service: Cardiovascular;  Laterality: Left;  Attempted    TONSILLECTOMY      Allergies  Allergen Reactions   Tape Other (See Comments)    TAPE WILL TEAR THE SKIN!!!!    Outpatient Encounter Medications as of 12/17/2020  Medication Sig   acetaminophen (TYLENOL) 650 MG CR tablet Take 650 mg by mouth every 8 (eight) hours as needed for pain.   apixaban (ELIQUIS) 2.5 MG TABS tablet Take 2.5 mg by mouth 2 (two) times daily. Morning: 7am-11am Evening: 7:30pm-11pm   bisacodyl (DULCOLAX) 10 MG suppository Place 10 mg rectally daily as needed for moderate constipation.   calcium carbonate (TUMS - DOSED IN MG ELEMENTAL CALCIUM) 500 MG chewable tablet Chew 1 tablet by mouth 2 (two) times daily.   Calcium Carbonate-Vitamin D 600-400 MG-UNIT tablet Take 1 tablet by mouth daily. Morning: 7am-11am.   Cholecalciferol (D3-1000) 25 MCG (1000 UT) tablet Take 1,000 Units by mouth daily. Morning 7am-11am.   Cholecalciferol (VITAMIN D3) 50 MCG (2000 UT) TABS Take 1 tablet by mouth daily.   gabapentin (NEURONTIN) 100 MG capsule Take 100 mg by mouth at bedtime. 7:30pm-11pm.   loratadine (CLARITIN) 10 MG tablet Take 10 mg by mouth daily.   metFORMIN (GLUCOPHAGE) 500 MG tablet Take 500 mg by mouth 2 (two) times daily with a meal. Morning: 7am-9am Evening: 5pm-7pm.   metoprolol tartrate (LOPRESSOR) 25 MG tablet Take 12.5 mg by mouth 2 (two) times daily. Morning: 7am-11am Evening: 7pm-11pm   Propylene Glycol-Glycerin 1-0.3 % SOLN Place 2 drops into both eyes daily as needed (dry eyes).    sennosides-docusate sodium  (SENOKOT-S) 8.6-50 MG tablet Take 2 tablets by mouth at bedtime as needed.    simvastatin (ZOCOR) 10 MG tablet Take 10 mg by mouth daily. Between the hours of 5pm-7pm.   vitamin B-12 (CYANOCOBALAMIN) 500 MCG tablet Take 500 mcg by mouth daily.   zinc oxide 20 % ointment Apply 1 application topically as needed for irritation. To buttocks after every incontinent episode and as needed for redness. May keep at bedside. As Needed   No facility-administered encounter medications on file as of 12/17/2020.    Review of Systems  Constitutional:  Negative for activity change, appetite change, chills, fatigue and fever.  HENT:  Negative for dental problem, hearing loss and trouble swallowing.   Eyes:  Negative for visual disturbance.       Glasses  Respiratory:  Positive for cough. Negative for shortness of breath and wheezing.   Cardiovascular:  Positive for chest pain.  Gastrointestinal:  Positive for constipation. Negative for abdominal distention, abdominal pain, blood in stool, diarrhea and nausea.  Genitourinary:  Negative for dysuria, frequency and hematuria.  Musculoskeletal:  Positive for gait problem.       Bilateral AKA, phantom  pain  Skin:  Positive for rash.  Neurological:  Positive for weakness. Negative for dizziness and headaches.  Hematological:  Bruises/bleeds easily.  Psychiatric/Behavioral:  Negative for confusion, dysphoric mood and sleep disturbance. The patient is not nervous/anxious.    Immunization History  Administered Date(s) Administered   DTaP 12/31/2012   Influenza, High Dose Seasonal PF 04/04/2017, 04/10/2019   Influenza,inj,Quad PF,6+ Mos 03/28/2018   Influenza-Unspecified 04/10/2014, 03/25/2015, 04/06/2016, 03/30/2020   Moderna SARS-COV2 Booster Vaccination 05/10/2020, 12/01/2020   Moderna Sars-Covid-2 Vaccination 06/30/2019, 07/28/2019   PPD Test 04/29/2014   Pneumococcal Conjugate-13 04/28/2017   Pneumococcal-Unspecified 03/13/2011   Td 12/24/2012   Tdap  04/28/2017   Zoster Recombinat (Shingrix) 12/14/2017   Pertinent  Health Maintenance Due  Topic Date Due   FOOT EXAM  06/27/2018   URINE MICROALBUMIN  03/14/2019   OPHTHALMOLOGY EXAM  07/01/2020   INFLUENZA VACCINE  01/24/2021   HEMOGLOBIN A1C  02/07/2021   PNA vac Low Risk Adult  Completed   Fall Risk  10/02/2018 09/11/2018 08/28/2018 08/08/2018 08/07/2018  Falls in the past year? 0 0 0 0 0  Number falls in past yr: 0 0 0 - 0  Injury with Fall? 0 0 0 - 0  Risk for fall due to : - - - - -   Functional Status Survey:    Vitals:   12/17/20 1349  BP: 111/65  Pulse: (!) 18  Resp: 18  Temp: (!) 96.4 F (35.8 C)  SpO2: 94%  Weight: 113 lb 9.6 oz (51.5 kg)   Body mass index is 23.74 kg/m. Physical Exam Vitals reviewed.  Constitutional:      General: He is not in acute distress. HENT:     Head: Normocephalic.     Right Ear: There is no impacted cerumen.     Left Ear: There is no impacted cerumen.     Nose: Nose normal.     Mouth/Throat:     Mouth: Mucous membranes are moist.     Pharynx: No posterior oropharyngeal erythema.  Eyes:     General:        Right eye: No discharge.        Left eye: No discharge.  Neck:     Vascular: No carotid bruit.  Cardiovascular:     Rate and Rhythm: Normal rate. Rhythm irregular.     Pulses: Normal pulses.     Heart sounds: Normal heart sounds. No murmur heard. Pulmonary:     Effort: Pulmonary effort is normal. No respiratory distress.     Breath sounds: Normal breath sounds. No wheezing.  Abdominal:     General: Bowel sounds are normal. There is no distension.     Palpations: Abdomen is soft.     Tenderness: There is no abdominal tenderness.  Musculoskeletal:        General: Normal range of motion.     Cervical back: Normal range of motion.  Lymphadenopathy:     Cervical: No cervical adenopathy.  Skin:    General: Skin is warm and dry.     Capillary Refill: Capillary refill takes less than 2 seconds.     Findings: Rash present.      Comments: Periarea and scrotal skin red and tender. No skin breakdown noted, no drainage.   Neurological:     General: No focal deficit present.     Mental Status: He is alert and oriented to person, place, and time.     Motor: Weakness present.     Gait: Gait  abnormal.     Comments: Motorized wheelchair  Psychiatric:        Mood and Affect: Mood normal.        Behavior: Behavior normal.    Labs reviewed: Recent Labs    04/16/20 0000 08/10/20 0000  NA 136* 139  K 4.5 4.7  CL 97* 102  CO2 31* 31*  BUN 20 24*  CREATININE 1.2 1.2  CALCIUM 9.5 8.8   Recent Labs    04/16/20 0000 08/10/20 0000  AST 14 13*  ALT 12 12  ALKPHOS 65 55  ALBUMIN 3.6 3.1*   Recent Labs    04/16/20 0000 08/10/20 0000  WBC 7.5 8.2  NEUTROABS 4,688.00 5,150.00  HGB 13.0* 11.5*  HCT 39* 34*  PLT 415* 360   Lab Results  Component Value Date   TSH 1.72 10/31/2019   Lab Results  Component Value Date   HGBA1C 7.6 08/10/2020   Lab Results  Component Value Date   CHOL 138 08/10/2020   HDL 43 08/10/2020   LDLCALC 78 08/10/2020   TRIG 83 08/10/2020   CHOLHDL 2.6 08/29/2018    Significant Diagnostic Results in last 30 days:  No results found.  Assessment/Plan 1. Candidal dermatitis - suspect yeast to periarea and scrotum - nystatin ointment (MYCOSTATIN); Apply 1 application topically 3 (three) times daily.  Dispense: 30 g; Refill: 0  2. Paroxysmal atrial fibrillation (HCC) - rate controlled with metoprolol - cont eliquis for clot prevention  3. Controlled type 2 diabetes mellitus with stage 3 chronic kidney disease, without long-term current use of insulin (HCC) - A1c 7.6 08/10/2020 - blood sugars 120-140's, no hypoglycemic events - cont metformin 500 mg bid - cont to limit carbs and sugars in diet  4. Hyperlipidemia LDL goal <70 - LDL 78 08/10/2020 - cont simvastatin 10 mg daily  5. S/P AKA (above knee amputation) bilateral (HCC) - ambulates with motorized wheelchair,  independent with toileting and transfers - cont skilled nursing care  6. Cough productive of clear sputum - stable, no changes to cough or sputum  - not interested in mucinex  7. Neuropathic pain - stable with neurontin 100 mg qhs  8. Chest pain, unspecified type - last episode 12/04/2020, refused to be taken to ED - no other incidents    Family/ staff Communication: plan discussed with patient and nurse  Labs/tests ordered:  none

## 2020-12-20 ENCOUNTER — Encounter: Payer: Self-pay | Admitting: Orthopedic Surgery

## 2021-01-07 ENCOUNTER — Encounter: Payer: Self-pay | Admitting: Orthopedic Surgery

## 2021-01-07 ENCOUNTER — Non-Acute Institutional Stay (INDEPENDENT_AMBULATORY_CARE_PROVIDER_SITE_OTHER): Payer: PPO | Admitting: Orthopedic Surgery

## 2021-01-07 DIAGNOSIS — Z0000 Encounter for general adult medical examination without abnormal findings: Secondary | ICD-10-CM | POA: Diagnosis not present

## 2021-01-07 NOTE — Progress Notes (Signed)
Provider:  Elodia Florence Location:  [facility]   Place of Service:   [place]   PCP: Virgie Dad, MD Patient Care Team: Virgie Dad, MD as PCP - General (Internal Medicine) Buford Dresser, MD as PCP - Cardiology (Cardiology) Ricard Dillon, MD as Consulting Physician (Internal Medicine) Virgie Dad, MD (Internal Medicine)  Extended Emergency Contact Information Primary Emergency Contact: Gordon,[place] Address: [address]          [place], [place] [address] [name] of [place] Phone: [phone] Mobile Phone: [phone] Relation: Daughter Secondary Emergency Contact: [place] Mobile Phone: [phone] Relation: Daughter  Code Status: DNR Goals of Care:Advanced Directive information Advanced Directives 01/07/2021  Does Patient Have a Medical Advance Directive? Yes  Type of Paramedic of [place];Living will  Does patient want to make changes to medical advance directive? No - Patient declined  Copy of [place] in Chart? Yes - validated most recent copy scanned in chart (See row information)  Pre-existing out of facility DNR order (yellow form or pink MOST form) -     Chief Complaint  Patient presents with   Annual Exam    Annual wellness exam    HPI: Patient is a 87 y.o. male seen today for an annual comprehensive examination.  Past Medical History:  Diagnosis Date   BPH (benign prostatic hyperplasia) 10/14/2014   Cervicalgia 01/04/2016   Chronic kidney disease    Contusion of left shoulder 05/27/14   DM type 2 (diabetes mellitus, type 2) ([place])    Dysphagia    High blood pressure    History of basal cell cancer 11/21/2005   Right temple   History of colonic polyps 10/14/2003   Hyperlipidemia    Left knee pain 05/27/14   Peripheral neuropathic pain    Peripheral vascular disease ([place])    SAH (subarachnoid hemorrhage) ([place]) 2009   TBI   Stroke [facility])    TIA  (transient ischemic attack) 11/06/2005   Left eye pain. Slurred speech. Diaphoresis. No residual effects.    Past Surgical History:  Procedure Laterality Date   ABDOMINAL AORTOGRAM W/LOWER EXTREMITY N/A 06/12/2018   Procedure: ABDOMINAL AORTOGRAM W/LOWER EXTREMITY;  Surgeon: Marty Heck, MD;  Location: [facility];  Service: Cardiovascular;  Laterality: N/A;   Achilles tendon repair Left 1998   ruptured tendon   AMPUTATION Left 10/11/2018   Procedure: Left  AMPUTATION ABOVE KNEE;  Surgeon: Marty Heck, MD;  Location: [place];  Service: Vascular;  Laterality: Left;   AMPUTATION Right 02/07/2019   Procedure: AMPUTATION ABOVE KNEE;  Surgeon: Rosetta Posner, MD;  Location: [place];  Service: Vascular;  Laterality: Right;   COLONOSCOPY  1992   Polyp removed   COLONOSCOPY  1998   normal   ORIF FEMUR FRACTURE Right 1929   PERIPHERAL VASCULAR INTERVENTION Left 06/12/2018   Procedure: PERIPHERAL VASCULAR INTERVENTION;  Surgeon: Marty Heck, MD;  Location: [place] [facility];  Service: Cardiovascular;  Laterality: Left;  Attempted    TONSILLECTOMY      reports that he quit smoking about 49 years ago. His smoking use included cigarettes. He has never used smokeless tobacco. He reports previous alcohol use of about 1.0 - 2.0 standard drink of alcohol per week. He reports that he does not use drugs. Social History   Socioeconomic History   Marital status: Married    Spouse name: Not on file   Number of children: Not on  file   Years of education: Not on file   Highest education level: Not on file  Occupational History   Occupation: retired Hotel manager   Tobacco Use   Smoking status: Former    Types: Cigarettes    Quit date: 06/26/1971    Years since quitting: 49.5   Smokeless tobacco: Never   Tobacco comments:    2 1/2 packs a day, until 1972  Vaping Use   Vaping Use: Never used  Substance and Sexual Activity   Alcohol use: Not Currently    Alcohol/week: 1.0 -  2.0 standard drink    Types: 1 - 2 Glasses of wine per week    Comment: Wine nightly   Drug use: No   Sexual activity: Not on file  Other Topics Concern   Not on file  Social History Narrative   Patient [facility] at [facility] since Dec 2015   Caffeine- Coffee   Married- Yes, 1951   [employer] [employer] 1943-46   House- Apartment with 2 people   Pets- No   Current/past profession- Press photographer   Exercise- Yes, walking   Living will- Yes   DNR- Yes   POA/HPOA- Yes      Social Determinants of Health   Financial Resource Strain: Low Risk    Difficulty of Paying Living Expenses: Not hard at all  Food Insecurity: No Food Insecurity   Worried About Charity fundraiser in the Last Year: Never true   [place] in the Last Year: Never true  Transportation Needs: No Transportation Needs   Lack of Transportation (Medical): No   Lack of Transportation (Non-Medical): No  Physical Activity: Insufficiently Active   Days of Exercise per Week: 7 days   Minutes of Exercise per Session: 20 min  Stress: No Stress Concern Present   Feeling of Stress : Not at all  Social Connections: Moderately Isolated   Frequency of Communication with Friends and Family: Three times a week   Frequency of Social Gatherings with Friends and Family: Three times a week   Attends Religious Services: Never   Active Member of Clubs or Organizations: No   Attends Archivist Meetings: Never   Marital Status: Married  Human resources officer Violence: Not At Risk   Fear of Current or Ex-Partner: No   Emotionally Abused: No   Physically Abused: No   Sexually Abused: No   Family History  Problem Relation Age of Onset   Heart attack Mother    Heart disease Mother    Heart disease Father     Pertinent  Health Maintenance Due  Topic Date Due   FOOT EXAM  06/27/2018   URINE MICROALBUMIN  03/14/2019   OPHTHALMOLOGY EXAM  07/01/2020   INFLUENZA VACCINE  01/24/2021   HEMOGLOBIN A1C  02/07/2021   PNA vac Low Risk  Adult  Completed   Fall Risk  01/07/2021 10/02/2018 09/11/2018 08/28/2018 08/08/2018  Falls in the past year? 1 0 0 0 0  Number falls in past yr: 1 0 0 0 -  Injury with Fall? 0 0 0 0 -  Risk for fall due to : History of fall(s);Impaired balance/gait;Impaired mobility - - - -  Follow up Falls evaluation completed;Education provided;Falls prevention discussed - - - -   Depression screen [facility] 2/9 01/07/2021 06/05/2018 04/27/2017 03/21/2017 09/07/2015  Decreased Interest 0 0 0 0 0  Down, Depressed, Hopeless 0 0 0 0 0  PHQ - 2 Score 0 0 0 0 0  Altered sleeping - 0 - - -  Tired, decreased energy - 0 - - -  Change in appetite - 0 - - -  Feeling bad or failure about yourself  - 0 - - -  Trouble concentrating - 0 - - -  Moving slowly or fidgety/restless - 0 - - -  Suicidal thoughts - 0 - - -  PHQ-9 Score - 0 - - -    Functional Status Survey: Is the patient deaf or have difficulty hearing?: No Does the patient have difficulty seeing, even when wearing glasses/contacts?: No Does the patient have difficulty concentrating, remembering, or making decisions?: No Does the patient have difficulty walking or climbing stairs?: Yes Does the patient have difficulty dressing or bathing?: Yes Does the patient have difficulty doing errands alone such as visiting a doctor's office or shopping?: Yes  Allergies  Allergen Reactions   Tape Other (See Comments)    TAPE WILL TEAR THE SKIN!!!!    Allergies as of 01/07/2021       Reactions   Tape Other (See Comments)   TAPE WILL TEAR THE SKIN!!!!        Medication List        Accurate as of January 07, 2021  1:48 PM. If you have any questions, ask your nurse or doctor.          acetaminophen 650 MG CR tablet Commonly known as: TYLENOL Take 650 mg by mouth every 8 (eight) hours as needed for pain.   apixaban 2.5 MG Tabs tablet Commonly known as: ELIQUIS Take 2.5 mg by mouth 2 (two) times daily. Morning: 7am-11am Evening: 7:30pm-11pm   bisacodyl 10 MG  suppository Commonly known as: DULCOLAX Place 10 mg rectally daily as needed for moderate constipation.   calcium carbonate 500 MG chewable tablet Commonly known as: TUMS - dosed in mg elemental calcium Chew 1 tablet by mouth 2 (two) times daily.   Calcium Carbonate-Vitamin D 600-400 MG-UNIT tablet Take 1 tablet by mouth daily. Morning: 7am-11am.   D3-1000 25 MCG (1000 UT) tablet Generic drug: Cholecalciferol Take 1,000 Units by mouth daily. Morning 7am-11am.   Vitamin D3 50 MCG (2000 UT) Tabs Take 1 tablet by mouth daily.   gabapentin 100 MG capsule Commonly known as: NEURONTIN Take 100 mg by mouth at bedtime. 7:30pm-11pm.   loratadine 10 MG tablet Commonly known as: CLARITIN Take 10 mg by mouth daily.   metFORMIN 500 MG tablet Commonly known as: GLUCOPHAGE Take 500 mg by mouth 2 (two) times daily with a meal. Morning: 7am-9am Evening: 5pm-7pm.   metoprolol tartrate 25 MG tablet Commonly known as: LOPRESSOR Take 12.5 mg by mouth 2 (two) times daily. Morning: 7am-11am Evening: 7pm-11pm   nystatin ointment Commonly known as: MYCOSTATIN Apply 1 application topically 3 (three) times daily.   Propylene Glycol-Glycerin 1-0.3 % Soln Place 2 drops into both eyes daily as needed (dry eyes).   sennosides-docusate sodium 8.6-50 MG tablet Commonly known as: SENOKOT-S Take 2 tablets by mouth at bedtime as needed.   simvastatin 10 MG tablet Commonly known as: ZOCOR Take 10 mg by mouth daily. Between the hours of 5pm-7pm.   vitamin B-12 500 MCG tablet Commonly known as: CYANOCOBALAMIN Take 500 mcg by mouth daily.   zinc oxide 20 % ointment Apply 1 application topically as needed for irritation. To buttocks after every incontinent episode and as needed for redness. May keep at bedside. As Needed        Review of Systems  Vitals:  01/07/21 1136  BP: 116/71  Pulse: 75  Resp: 20  Temp: (!) 97.1 F (36.2 C)  SpO2: 97%  Weight: 111 lb 14.4 oz (50.8 kg)  Height:  4\' 10"  (1.473 m)   Body mass index is 23.39 kg/m. Physical Exam  Labs reviewed: Basic Metabolic Panel: Recent Labs    04/16/20 0000 08/10/20 0000  NA 136* 139  K 4.5 4.7  CL 97* 102  CO2 31* 31*  BUN 20 24*  CREATININE 1.2 1.2  CALCIUM 9.5 8.8   Liver Function Tests: Recent Labs    04/16/20 0000 08/10/20 0000  AST 14 13*  ALT 12 12  ALKPHOS 65 55  ALBUMIN 3.6 3.1*   No results for input(s): LIPASE, AMYLASE in the last 8760 hours. No results for input(s): AMMONIA in the last 8760 hours. CBC: Recent Labs    04/16/20 0000 08/10/20 0000 11/04/20 0000  WBC 7.5 8.2 4.8  NEUTROABS 4,688.00 5,150.00 2,390.00  HGB 13.0* 11.5* 12.2*  HCT 39* 34* 37*  PLT 415* 360 320   Cardiac Enzymes: No results for input(s): CKTOTAL, CKMB, CKMBINDEX, TROPONINI in the last 8760 hours. BNP: Invalid input(s): POCBNP Lab Results  Component Value Date   HGBA1C 7.6 08/10/2020   Lab Results  Component Value Date   TSH 1.72 10/31/2019   Lab Results  Component Value Date   VITAMINB12 214 10/31/2019   No results found for: FOLATE No results found for: IRON, TIBC, FERRITIN  Imaging and Procedures obtained recently: No results found.  Assessment/Plan There are no diagnoses linked to this encounter.   Family/ staff Communication:   Labs/tests ordered:    Subjective:   ERHARD SENSKE is a 87 y.o. male who presents for Medicare Annual/Subsequent preventive examination.  Review of Systems     Cardiac Risk Factors include: advanced age (>100men, >59 women);hypertension;male gender;sedentary lifestyle     Objective:    Today's Vitals   01/07/21 1136  BP: 116/71  Pulse: 75  Resp: 20  Temp: (!) 97.1 F (36.2 C)  SpO2: 97%  Weight: 111 lb 14.4 oz (50.8 kg)  Height: 4\' 10"  (1.473 m)   Body mass index is 23.39 kg/m.  Advanced Directives 01/07/2021 11/03/2020 10/29/2020 05/27/2020 05/06/2020 03/30/2020 02/27/2020  Does Patient Have a Medical Advance Directive? Yes Yes  Yes Yes Yes Yes Yes  Type of Paramedic of [place];Living will [place];Living will;Out of facility DNR (pink MOST or yellow form) Living will;Healthcare Power of [place];Out of facility DNR (pink MOST or yellow form) [place];Living will;Out of facility DNR (pink MOST or yellow form) Living will;Healthcare Power of [place];Out of facility DNR (pink MOST or yellow form) Living will;Healthcare Power of [place];Living will  Does patient want to make changes to medical advance directive? No - Patient declined No - Patient declined No - Patient declined No - Patient declined No - Patient declined No - Patient declined No - Patient declined  Copy of [place] in Chart? Yes - validated most recent copy scanned in chart (See row information) Yes - validated most recent copy scanned in chart (See row information) Yes - validated most recent copy scanned in chart (See row information) Yes - validated most recent copy scanned in chart (See row information) Yes - validated most recent copy scanned in chart (See row information) Yes - validated most recent copy scanned in chart (See row information) Yes - validated most recent copy scanned in  chart (See row information)  Pre-existing out of facility DNR order (yellow form or pink MOST form) - - Yellow form placed in chart (order not valid for inpatient use) Yellow form placed in chart (order not valid for inpatient use) - - -    Current Medications (verified) Outpatient Encounter Medications as of 01/07/2021  Medication Sig   acetaminophen (TYLENOL) 650 MG CR tablet Take 650 mg by mouth every 8 (eight) hours as needed for pain.   apixaban (ELIQUIS) 2.5 MG TABS tablet Take 2.5 mg by mouth 2 (two) times daily. Morning: 7am-11am Evening: 7:30pm-11pm   bisacodyl (DULCOLAX) 10 MG suppository Place 10 mg rectally daily as needed for moderate constipation.    calcium carbonate (TUMS - DOSED IN MG ELEMENTAL CALCIUM) 500 MG chewable tablet Chew 1 tablet by mouth 2 (two) times daily.   Calcium Carbonate-Vitamin D 600-400 MG-UNIT tablet Take 1 tablet by mouth daily. Morning: 7am-11am.   Cholecalciferol (D3-1000) 25 MCG (1000 UT) tablet Take 1,000 Units by mouth daily. Morning 7am-11am.   Cholecalciferol (VITAMIN D3) 50 MCG (2000 UT) TABS Take 1 tablet by mouth daily.   gabapentin (NEURONTIN) 100 MG capsule Take 100 mg by mouth at bedtime. 7:30pm-11pm.   metFORMIN (GLUCOPHAGE) 500 MG tablet Take 500 mg by mouth 2 (two) times daily with a meal. Morning: 7am-9am Evening: 5pm-7pm.   metoprolol tartrate (LOPRESSOR) 25 MG tablet Take 12.5 mg by mouth 2 (two) times daily. Morning: 7am-11am Evening: 7pm-11pm   nystatin ointment (MYCOSTATIN) Apply 1 application topically 3 (three) times daily.   Propylene Glycol-Glycerin 1-0.3 % SOLN Place 2 drops into both eyes daily as needed (dry eyes).    sennosides-docusate sodium (SENOKOT-S) 8.6-50 MG tablet Take 2 tablets by mouth at bedtime as needed.    simvastatin (ZOCOR) 10 MG tablet Take 10 mg by mouth daily. Between the hours of 5pm-7pm.   vitamin B-12 (CYANOCOBALAMIN) 500 MCG tablet Take 500 mcg by mouth daily.   zinc oxide 20 % ointment Apply 1 application topically as needed for irritation. To buttocks after every incontinent episode and as needed for redness. May keep at bedside. As Needed   loratadine (CLARITIN) 10 MG tablet Take 10 mg by mouth daily.   No facility-administered encounter medications on file as of 01/07/2021.    Allergies (verified) Tape   History: Past Medical History:  Diagnosis Date   BPH (benign prostatic hyperplasia) 10/14/2014   Cervicalgia 01/04/2016   Chronic kidney disease    Contusion of left shoulder 05/27/14   DM type 2 (diabetes mellitus, type 2) ([place])    Dysphagia    High blood pressure    History of basal cell cancer 11/21/2005   Right temple   History of colonic polyps  10/14/2003   Hyperlipidemia    Left knee pain 05/27/14   Peripheral neuropathic pain    Peripheral vascular disease ([place])    SAH (subarachnoid hemorrhage) ([place]) 2009   TBI   Stroke [facility])    TIA (transient ischemic attack) 11/06/2005   Left eye pain. Slurred speech. Diaphoresis. No residual effects.    Past Surgical History:  Procedure Laterality Date   ABDOMINAL AORTOGRAM W/LOWER EXTREMITY N/A 06/12/2018   Procedure: ABDOMINAL AORTOGRAM W/LOWER EXTREMITY;  Surgeon: Marty Heck, MD;  Location: [facility];  Service: Cardiovascular;  Laterality: N/A;   Achilles tendon repair Left 1998   ruptured tendon   AMPUTATION Left 10/11/2018   Procedure: Left  AMPUTATION ABOVE KNEE;  Surgeon: Marty Heck, MD;  [facility];  Service: Vascular;  Laterality: Left;   AMPUTATION Right 02/07/2019   Procedure: AMPUTATION ABOVE KNEE;  Surgeon: Rosetta Posner, MD;  Location: [place];  Service: Vascular;  Laterality: Right;   COLONOSCOPY  1992   Polyp removed   COLONOSCOPY  1998   normal   ORIF FEMUR FRACTURE Right 1929   PERIPHERAL VASCULAR INTERVENTION Left 06/12/2018   Procedure: PERIPHERAL VASCULAR INTERVENTION;  Surgeon: Marty Heck, MD;  Location: [facility];  Service: Cardiovascular;  Laterality: Left;  Attempted    TONSILLECTOMY     Family History  Problem Relation Age of Onset   Heart attack Mother    Heart disease Mother    Heart disease Father    Social History   Socioeconomic History   Marital status: Married    Spouse name: Not on file   Number of children: Not on file   Years of education: Not on file   Highest education level: Not on file  Occupational History   Occupation: retired Hotel manager   Tobacco Use   Smoking status: Former    Types: Cigarettes    Quit date: 06/26/1971    Years since quitting: 49.5   Smokeless tobacco: Never   Tobacco comments:    2 1/2 packs a day, until 1972  Vaping Use   Vaping Use: Never used  Substance  and Sexual Activity   Alcohol use: Not Currently    Alcohol/week: 1.0 - 2.0 standard drink    Types: 1 - 2 Glasses of wine per week    Comment: Wine nightly   Drug use: No   Sexual activity: Not on file  Other Topics Concern   Not on file  Social History Narrative   Patient lives at [facility] since Dec 2015   Caffeine- Coffee   Married- Yes, 1951   [employer] [employer] 1943-46   House- Apartment with 2 people   Pets- No   Current/past profession- Press photographer   Exercise- Yes, walking   Living will- Yes   DNR- Yes   POA/HPOA- Yes      Social Determinants of Health   Financial Resource Strain: Low Risk    Difficulty of Paying Living Expenses: Not hard at all  Food Insecurity: No Food Insecurity   Worried About Charity fundraiser in the Last Year: Never true   [place] in the Last Year: Never true  Transportation Needs: No Transportation Needs   Lack of Transportation (Medical): No   Lack of Transportation (Non-Medical): No  Physical Activity: Insufficiently Active   Days of Exercise per Week: 7 days   Minutes of Exercise per Session: 20 min  Stress: No Stress Concern Present   Feeling of Stress : Not at all  Social Connections: Moderately Isolated   Frequency of Communication with Friends and Family: Three times a week   Frequency of Social Gatherings with Friends and Family: Three times a week   Attends Religious Services: Never   Active Member of Clubs or Organizations: No   Attends Archivist Meetings: Never   Marital Status: Married    Tobacco Counseling Counseling given: Not Answered Tobacco comments: 2 1/2 packs a day, until 1972   Clinical Intake:  Pre-visit preparation completed: Yes  Pain : No/denies pain BMI - recorded: 23.39 Nutritional Status: BMI of 19-24  Normal Nutritional Risks: None Diabetes: No  How often do you need to have someone help you when you read instructions, pamphlets, or  other written materials from your doctor or  pharmacy?: 2 - Rarely What is the last grade level you completed in school?: 2 years college  Diabetic? Yes  Interpreter Needed?: No      Activities of Daily Living In your present state of health, do you have any difficulty performing the following activities: 01/07/2021  Hearing? N  Vision? N  Difficulty concentrating or making decisions? N  Walking or climbing stairs? Y  Dressing or bathing? Y  Doing errands, shopping? Y  Preparing Food and eating ? Y  Using the Toilet? Y  In the past six months, have you accidently leaked urine? N  Do you have problems with loss of bowel control? N  Managing your Medications? Y  Managing your Finances? Y  Housekeeping or managing your Housekeeping? Y  Some recent data might be hidden    Patient Care Team: Virgie Dad, MD as PCP - General (Internal Medicine) Buford Dresser, MD as PCP - Cardiology (Cardiology) Ricard Dillon, MD as Consulting Physician (Internal Medicine) Virgie Dad, MD (Internal Medicine)  Indicate any recent Medical Services you may have received from other than [facility] providers in the past year (date may be approximate).     Assessment:   This is a routine wellness examination for Nathan Cook.  Hearing/Vision screen No results found.  Dietary issues and exercise activities discussed: Current Exercise Habits: The patient does not participate in regular exercise at present, Exercise limited by: orthopedic condition(s);Other - see comments (bilateral above the knee amputation)   Goals Addressed             This Visit's Progress    Maintain Mobility and Function   On track    Evidence-based guidance:  Emphasize the importance of physical activity and aerobic exercise as included in treatment plan; assess barriers to adherence; consider patient's abilities and preferences.  Encourage gradual increase in activity or exercise instead of stopping if pain occurs.  Reinforce individual therapy exercise  prescription, such as strengthening, stabilization and stretching programs.  Promote optimal body mechanics to stabilize the spine with lifting and functional activity.  Encourage activity and mobility modifications to facilitate optimal function, such as using a log roll for bed mobility or dressing from a seated position.  Reinforce individual adaptive equipment recommendations to limit excessive spinal movements, such as a Systems analyst.  Assess adequacy of sleep; encourage use of sleep hygiene techniques, such as bedtime routine; use of white noise; dark, cool bedroom; avoiding daytime naps, heavy meals or exercise before bedtime.  Promote positions and modification to optimize sleep and sexual activity; consider pillows or positioning devices to assist in maintaining neutral spine.  Explore options for applying ergonomic principles at work and home, such as frequent position changes, using ergonomically designed equipment and working at optimal height.  Promote modifications to increase comfort with driving such as lumbar support, optimizing seat and steering wheel position, using cruise control and taking frequent rest stops to stretch and walk.   Notes:        Depression Screen PHQ 2/9 Scores 01/07/2021 06/05/2018 04/27/2017 03/21/2017 09/07/2015 09/08/2014  PHQ - 2 Score 0 0 0 0 0 0  PHQ- 9 Score - 0 - - - -  Exception Documentation - - - - - Patient refusal    Fall Risk Fall Risk  01/07/2021 10/02/2018 09/11/2018 08/28/2018 08/08/2018  Falls in the past year? 1 0 0 0 0  Number falls in past yr: 1 0 0 0 -  Injury  with Fall? 0 0 0 0 -  Risk for fall due to : History of fall(s);Impaired balance/gait;Impaired mobility - - - -  Follow up Falls evaluation completed;Education provided;Falls prevention discussed - - - -    FALL RISK PREVENTION PERTAINING TO THE HOME:  Any stairs in or around the home? No  If so, are there any without handrails? No  Home free of loose throw rugs in  walkways, pet beds, electrical cords, etc? Yes  Adequate lighting in your home to reduce risk of falls? Yes   ASSISTIVE DEVICES UTILIZED TO PREVENT FALLS:  Life alert? No  Use of a cane, walker or w/c? Yes  Grab bars in the bathroom? Yes  Shower chair or bench in shower? Yes  Elevated toilet seat or a handicapped toilet? Yes   TIMED UP AND GO:  Was the test performed? No .  Length of time to ambulate 10 feet:  sec.   Gait unsteady without use of assistive device, provider informed and interventions were implemented  Cognitive Function: MMSE - Mini Mental State Exam 01/07/2021 09/19/2017 04/27/2017  Not completed: Refused (No Data) -  Orientation to time - 5 5  Orientation to Place - 5 5  Registration - 3 3  Attention/ Calculation - 5 5  Recall - 3 2  Language- name 2 objects - 2 2  Language- repeat - 1 1  Language- follow 3 step command - 3 3  Language- read & follow direction - 1 1  Write a sentence - 1 1  Copy design - 1 1  Total score - 30 29     6CIT Screen 01/07/2021  What Year? 0 points  What month? 0 points  What time? 0 points  Count back from 20 0 points  Months in reverse 0 points  Repeat phrase 0 points  Total Score 0    Immunizations Immunization History  Administered Date(s) Administered   DTaP 12/31/2012   Influenza, High Dose Seasonal PF 04/04/2017, 04/10/2019   Influenza,inj,Quad PF,6+ Mos 03/28/2018   Influenza-Unspecified 04/10/2014, 03/25/2015, 04/06/2016, 03/30/2020   Moderna SARS-COV2 Booster Vaccination 05/10/2020, 12/01/2020   Moderna Sars-Covid-2 Vaccination 06/30/2019, 07/28/2019   PPD Test 04/29/2014   Pneumococcal Conjugate-13 04/28/2017   Pneumococcal-Unspecified 03/13/2011   Td 12/24/2012   Tdap 04/28/2017   Zoster Recombinat (Shingrix) 12/14/2017    TDAP status: Up to date  Flu Vaccine status: Up to date  Pneumococcal vaccine status: Up to date  Covid-19 vaccine status: Completed vaccines  Qualifies for Shingles Vaccine?  Yes   Zostavax completed No   Shingrix Completed?: Yes  Screening Tests Health Maintenance  Topic Date Due   Zoster Vaccines- Shingrix (2 of 2) 02/08/2018   FOOT EXAM  06/27/2018   URINE MICROALBUMIN  03/14/2019   OPHTHALMOLOGY EXAM  07/01/2020   INFLUENZA VACCINE  01/24/2021   HEMOGLOBIN A1C  02/07/2021   COVID-19 Vaccine (5 - Booster for Moderna series) 04/02/2021   TETANUS/TDAP  04/29/2027   PNA vac Low Risk Adult  Completed   HPV VACCINES  Aged Out    Health Maintenance  Health Maintenance Due  Topic Date Due   Zoster Vaccines- Shingrix (2 of 2) 02/08/2018   FOOT EXAM  06/27/2018   URINE MICROALBUMIN  03/14/2019   OPHTHALMOLOGY EXAM  07/01/2020    Colorectal cancer screening: No longer required.   Lung Cancer Screening: (Low Dose CT Chest recommended if Age 91-80 years, 30 pack-year currently smoking OR have quit w/in 15years.) does not qualify.  Lung Cancer Screening Referral: No  Additional Screening:  Hepatitis C Screening: does not qualify; advanced age  Vision Screening: Recommended annual ophthalmology exams for early detection of glaucoma and other disorders of the eye. Is the patient up to date with their annual eye exam?  No  Who is the provider or what is the name of the office in which the patient attends annual eye exams? N/A If pt is not established with a provider, would they like to be referred to a provider to establish care? No .   Dental Screening: Recommended annual dental exams for proper oral hygiene  Community Resource Referral / Chronic Care Management: CRR required this visit?  No   CCM required this visit?  No      Plan:     I have personally reviewed and noted the following in the patient's chart:   Medical and social history Use of alcohol, tobacco or illicit drugs  Current medications and supplements including opioid prescriptions. Patient is not currently taking opioid prescriptions. Functional ability and  status Nutritional status Physical activity Advanced directives List of other physicians Hospitalizations, surgeries, and ER visits in previous 12 months Vitals Screenings to include cognitive, depression, and falls Referrals and appointments  In addition, I have reviewed and discussed with patient certain preventive protocols, quality metrics, and best practice recommendations. A written personalized care plan for preventive services as well as general preventive health recommendations were provided to patient.     Yvonna Alanis, NP   01/07/2021

## 2021-01-07 NOTE — Patient Instructions (Signed)
  Nathan Cook , Thank you for taking time to come for your Medicare Wellness Visit. I appreciate your ongoing commitment to your health goals. Please review the following plan we discussed and let me know if I can assist you in the future.   These are the goals we discussed:  Goals      Maintain Lifestyle     Starting today pt will maintain lifestyle.       Maintain Mobility and Function     Evidence-based guidance:  Emphasize the importance of physical activity and aerobic exercise as included in treatment plan; assess barriers to adherence; consider patient's abilities and preferences.  Encourage gradual increase in activity or exercise instead of stopping if pain occurs.  Reinforce individual therapy exercise prescription, such as strengthening, stabilization and stretching programs.  Promote optimal body mechanics to stabilize the spine with lifting and functional activity.  Encourage activity and mobility modifications to facilitate optimal function, such as using a log roll for bed mobility or dressing from a seated position.  Reinforce individual adaptive equipment recommendations to limit excessive spinal movements, such as a Systems analyst.  Assess adequacy of sleep; encourage use of sleep hygiene techniques, such as bedtime routine; use of white noise; dark, cool bedroom; avoiding daytime naps, heavy meals or exercise before bedtime.  Promote positions and modification to optimize sleep and sexual activity; consider pillows or positioning devices to assist in maintaining neutral spine.  Explore options for applying ergonomic principles at work and home, such as frequent position changes, using ergonomically designed equipment and working at optimal height.  Promote modifications to increase comfort with driving such as lumbar support, optimizing seat and steering wheel position, using cruise control and taking frequent rest stops to stretch and walk.   Notes:         This  is a list of the screening recommended for you and due dates:  Health Maintenance  Topic Date Due   Zoster (Shingles) Vaccine (2 of 2) 02/08/2018   Complete foot exam   06/27/2018   Urine Protein Check  03/14/2019   Eye exam for diabetics  07/01/2020   Flu Shot  01/24/2021   Hemoglobin A1C  02/07/2021   COVID-19 Vaccine (5 - Booster for Moderna series) 04/02/2021   Tetanus Vaccine  04/29/2027   Pneumonia vaccines  Completed   HPV Vaccine  Aged Out

## 2021-01-10 ENCOUNTER — Ambulatory Visit (HOSPITAL_BASED_OUTPATIENT_CLINIC_OR_DEPARTMENT_OTHER): Payer: PPO | Admitting: Cardiology

## 2021-01-10 ENCOUNTER — Encounter: Payer: Self-pay | Admitting: Orthopedic Surgery

## 2021-01-10 ENCOUNTER — Encounter (HOSPITAL_BASED_OUTPATIENT_CLINIC_OR_DEPARTMENT_OTHER): Payer: Self-pay | Admitting: Cardiology

## 2021-01-10 ENCOUNTER — Other Ambulatory Visit: Payer: Self-pay

## 2021-01-10 ENCOUNTER — Non-Acute Institutional Stay (SKILLED_NURSING_FACILITY): Payer: PPO | Admitting: Orthopedic Surgery

## 2021-01-10 VITALS — BP 112/62 | HR 80 | Wt 111.0 lb

## 2021-01-10 DIAGNOSIS — Z89611 Acquired absence of right leg above knee: Secondary | ICD-10-CM

## 2021-01-10 DIAGNOSIS — N183 Chronic kidney disease, stage 3 unspecified: Secondary | ICD-10-CM

## 2021-01-10 DIAGNOSIS — I480 Paroxysmal atrial fibrillation: Secondary | ICD-10-CM

## 2021-01-10 DIAGNOSIS — M792 Neuralgia and neuritis, unspecified: Secondary | ICD-10-CM

## 2021-01-10 DIAGNOSIS — B372 Candidiasis of skin and nail: Secondary | ICD-10-CM | POA: Diagnosis not present

## 2021-01-10 DIAGNOSIS — Z8673 Personal history of transient ischemic attack (TIA), and cerebral infarction without residual deficits: Secondary | ICD-10-CM

## 2021-01-10 DIAGNOSIS — I739 Peripheral vascular disease, unspecified: Secondary | ICD-10-CM | POA: Diagnosis not present

## 2021-01-10 DIAGNOSIS — Z89612 Acquired absence of left leg above knee: Secondary | ICD-10-CM

## 2021-01-10 DIAGNOSIS — I10 Essential (primary) hypertension: Secondary | ICD-10-CM | POA: Diagnosis not present

## 2021-01-10 DIAGNOSIS — E785 Hyperlipidemia, unspecified: Secondary | ICD-10-CM | POA: Diagnosis not present

## 2021-01-10 DIAGNOSIS — E1122 Type 2 diabetes mellitus with diabetic chronic kidney disease: Secondary | ICD-10-CM

## 2021-01-10 DIAGNOSIS — R058 Other specified cough: Secondary | ICD-10-CM

## 2021-01-10 NOTE — Patient Instructions (Signed)
Medication Instructions:  No change *If you need a refill on your cardiac medications before your next appointment, please call your pharmacy*   Lab Work: None If you have labs (blood work) drawn today and your tests are completely normal, you will receive your results only by: [place] (if you have MyChart) OR A paper copy in the mail If you have any lab test that is abnormal or we need to change your treatment, we will call you to review the results.   Testing/Procedures: None   Follow-Up: At [facility], you and your health needs are our priority.  As part of our continuing mission to provide you with exceptional heart care, we have created designated Provider Care Teams.  These Care Teams include your primary Cardiologist (physician) and Advanced Practice Providers (APPs -  Physician Assistants and Nurse Practitioners) who all work together to provide you with the care you need, when you need it.  We recommend signing up for the patient portal called "MyChart".  Sign up information is provided on this After Visit Summary.  MyChart is used to connect with patients for Virtual Visits (Telemedicine).  Patients are able to view lab/test results, encounter notes, upcoming appointments, etc.  Non-urgent messages can be sent to your provider as well.   To learn more about what you can do with MyChart, go to [email or web address].    Your next appointment:   1 year(s)  The format for your next appointment:   In Person  Provider:   Buford Dresser, MD   Other Instructions

## 2021-01-10 NOTE — Progress Notes (Signed)
Cardiology Office Note:    Date:  01/10/2021   ID:  [name], DOB Jun 20, 1922, MRN [id number]  PCP:  Virgie Dad, MD  Cardiologist:  Buford Dresser, MD PhD  Referring MD: Virgie Dad, MD   CC: follow up  History of Present Illness:    Nathan Cook is a 87 y.o. male with a hx of HTN, HLD, type II diabetes, peripheral vascular disease s/p bilateral AKA, CVA s/p tPA who is seen for follow up today.  Cardiac history: history of PVD, with critical limb ischemia and nonhealing foot wound s/p vascular surgery 06/12/18, but unfortunately the distal vessels could not be opened. He presented with acute CVA 03/2018 and received tPA for brainstem infarct. He had newly diagnosed atrial fibrillation during that hospitalization.  Today: Doing well overall. No new concerns. Doing well in long term care. Tolerating medications.  He denies any chest pain, shortness of breath, palpitations, or exertional symptoms. No headaches, lightheadedness, or syncope to report. Also has no lower extremity edema, orthopnea or PND.   Past Medical History:  Diagnosis Date   BPH (benign prostatic hyperplasia) 10/14/2014   Cervicalgia 01/04/2016   Chronic kidney disease    Contusion of left shoulder 05/27/14   DM type 2 (diabetes mellitus, type 2) ([place])    Dysphagia    High blood pressure    History of basal cell cancer 11/21/2005   Right temple   History of colonic polyps 10/14/2003   Hyperlipidemia    Left knee pain 05/27/14   Peripheral neuropathic pain    Peripheral vascular disease ([place])    SAH (subarachnoid hemorrhage) ([place]) 2009   TBI   Stroke [facility])    TIA (transient ischemic attack) 11/06/2005   Left eye pain. Slurred speech. Diaphoresis. No residual effects.     Past Surgical History:  Procedure Laterality Date   ABDOMINAL AORTOGRAM W/LOWER EXTREMITY N/A 06/12/2018   Procedure: ABDOMINAL AORTOGRAM W/LOWER EXTREMITY;  Surgeon: Marty Heck, MD;  Location: [place] [facility];  Service: Cardiovascular;  Laterality: N/A;   Achilles tendon repair Left 1998   ruptured tendon   AMPUTATION Left 10/11/2018   Procedure: Left  AMPUTATION ABOVE KNEE;  Surgeon: Marty Heck, MD;  Location: [place];  Service: Vascular;  Laterality: Left;   AMPUTATION Right 02/07/2019   Procedure: AMPUTATION ABOVE KNEE;  Surgeon: Rosetta Posner, MD;  Location: [place];  Service: Vascular;  Laterality: Right;   COLONOSCOPY  1992   Polyp removed   COLONOSCOPY  1998   normal   ORIF FEMUR FRACTURE Right 1929   PERIPHERAL VASCULAR INTERVENTION Left 06/12/2018   Procedure: PERIPHERAL VASCULAR INTERVENTION;  Surgeon: Marty Heck, MD;  Location: [place] [facility];  Service: Cardiovascular;  Laterality: Left;  Attempted    TONSILLECTOMY      Current Medications: Current Outpatient Medications on File Prior to Visit  Medication Sig   acetaminophen (TYLENOL) 650 MG CR tablet Take 650 mg by mouth every 8 (eight) hours as needed for pain.   apixaban (ELIQUIS) 2.5 MG TABS tablet Take 2.5 mg by mouth 2 (two) times daily. Morning: 7am-11am Evening: 7:30pm-11pm   bisacodyl (DULCOLAX) 10 MG suppository Place 10 mg rectally daily as needed for moderate constipation.   calcium carbonate (TUMS - DOSED IN MG ELEMENTAL CALCIUM) 500 MG chewable tablet Chew 1 tablet by mouth 2 (two) times daily.   Calcium Carbonate-Vitamin D 600-400 MG-UNIT tablet Take 1 tablet by mouth daily. Morning: 7am-11am.  Cholecalciferol (D3-1000) 25 MCG (1000 UT) tablet Take 1,000 Units by mouth daily. Morning 7am-11am.   Cholecalciferol (VITAMIN D3) 50 MCG (2000 UT) TABS Take 1 tablet by mouth daily.   gabapentin (NEURONTIN) 100 MG capsule Take 100 mg by mouth at bedtime. 7:30pm-11pm.   loratadine (CLARITIN) 10 MG tablet Take 10 mg by mouth daily.   metFORMIN (GLUCOPHAGE) 500 MG tablet Take 500 mg by mouth 2 (two) times daily with a meal. Morning: 7am-9am Evening: 5pm-7pm.   metoprolol tartrate  (LOPRESSOR) 25 MG tablet Take 12.5 mg by mouth 2 (two) times daily. Morning: 7am-11am Evening: 7pm-11pm   nystatin ointment (MYCOSTATIN) Apply 1 application topically 3 (three) times daily.   Propylene Glycol-Glycerin 1-0.3 % SOLN Place 2 drops into both eyes daily as needed (dry eyes).    sennosides-docusate sodium (SENOKOT-S) 8.6-50 MG tablet Take 2 tablets by mouth at bedtime as needed.    simvastatin (ZOCOR) 10 MG tablet Take 10 mg by mouth daily. Between the hours of 5pm-7pm.   vitamin B-12 (CYANOCOBALAMIN) 500 MCG tablet Take 500 mcg by mouth daily.   zinc oxide 20 % ointment Apply 1 application topically as needed for irritation. To buttocks after every incontinent episode and as needed for redness. May keep at bedside. As Needed   No current facility-administered medications on file prior to visit.     Allergies:   Tape   Social History   Tobacco Use   Smoking status: Former    Types: Cigarettes    Quit date: 06/26/1971    Years since quitting: 49.5   Smokeless tobacco: Never   Tobacco comments:    2 1/2 packs a day, until 1972  Vaping Use   Vaping Use: Never used  Substance Use Topics   Alcohol use: Not Currently    Alcohol/week: 1.0 - 2.0 standard drink    Types: 1 - 2 Glasses of wine per week    Comment: Wine nightly   Drug use: No    Family History: The patient's family history includes Heart attack in his mother; Heart disease in his father and mother.  ROS:   Please see the history of present illness.   Additional pertinent ROS otherwise unremarkable.   EKGs/Labs/Other Studies Reviewed:    The following studies were reviewed today:  Echo 04/02/18 Study Conclusions  - Left ventricle: The cavity size was normal. Wall thickness was   normal. Systolic function was normal. The estimated ejection   fraction was in the range of 60% to 65%. Wall motion was normal;   there were no regional wall motion abnormalities. Doppler   parameters are consistent with  abnormal left ventricular   relaxation (grade 1 diastolic dysfunction). - Aortic valve: Valve mobility was restricted. There was moderate   stenosis. There was mild regurgitation. Valve area (VTI): 1.04   cm^2. Valve area (Vmax): 0.82 cm^2. Valve area (Vmean): 0.99   cm^2.   Impressions:  - Normal LV systolic function; mild diastolic dysfunction;   calcified aortic valve with moderate AS (mean gradient 21 mmHg)   and mild AI.  EKG:  EKG is personally reviewed.    01/10/2021: SR, 1st degree AV block, RBBB at 80 bpm, PAC 08/28/2019: sinus rhythm with RBBB and 1st degree AV block.   Recent Labs: 08/10/2020: ALT 12; BUN 24; Creatinine 1.2; Potassium 4.7; Sodium 139 11/04/2020: Hemoglobin 12.2; Platelets 320  Recent Lipid Panel    Component Value Date/Time   CHOL 138 08/10/2020 0000   TRIG 83 08/10/2020 0000  HDL 43 08/10/2020 0000   CHOLHDL 2.6 08/29/2018 0815   VLDL 6 04/02/2018 0259   LDLCALC 78 08/10/2020 0000   LDLCALC 59 08/29/2018 0815    Physical Exam:    VS:  BP 112/62   Pulse 80   Wt 111 lb (50.3 kg)   SpO2 97%   BMI 23.20 kg/m     Wt Readings from Last 3 Encounters:  01/10/21 111 lb (50.3 kg)  01/07/21 111 lb 14.4 oz (50.8 kg)  12/17/20 113 lb 9.6 oz (51.5 kg)   GEN: Well nourished, well developed in no acute distress HEENT: Normal, moist mucous membranes NECK: No JVD CARDIAC: regular rhythm, normal S1 and S2, no rubs or gallops. 3/6 SE murmur. VASCULAR: Radial and DP pulses 2+ bilaterally. No carotid bruits RESPIRATORY:  Clear to auscultation without rales, wheezing or rhonchi  ABDOMEN: Soft, non-tender, non-distended MUSCULOSKELETAL:  s/p bilateral AKA SKIN: Warm and dry, no edema in upper extremities, s/p bilateral AKA NEUROLOGIC:  Alert and oriented x 3. No focal neuro deficits noted. PSYCHIATRIC:  Normal affect    ASSESSMENT:    1. Paroxysmal atrial fibrillation (HCC)   2. PAD (peripheral artery disease) ([place])   3. S/P AKA (above knee amputation)  bilateral ([place])   4. History of CVA (cerebrovascular accident)   5. Essential hypertension     PLAN:    PAD, with history of critical limb ischemia: s/p L AKA and R AKA in 2020 -doing well overall, phantom pruritis but no pain. -on DOAC, not aspirin -on simvastatin  Paroxysmal atrial fibrillation, diagnosed at the time of CVA:  -CHA2DS2/VAS Stroke Risk Points  = 7   -continue apixaban for anticoagulation, 2.5 mg BID dosing due to age/weight -tolerating metoprolol for rate control of afib -in sinus rhythm today  History of CVA:  -on statin for secondary prevention, LDL goal <70.  -last lipids 07/2020, LDL 78. Given age, will not intensify -afib management as above  Hypertension goal <130/80, at goal today -doing well, reports no recent issues when checked at his facility -continue metoprolol 25 mg BID   Type II diabetes: on metformin, per PCP.  Chronic kidney disease, stage 3: avoid nephrotoxic agents.  Plan for follow up: 1 year or sooner PRN  Medication Adjustments/Labs and Tests Ordered: Current medicines are reviewed at length with the patient today.  Concerns regarding medicines are outlined above.  Orders Placed This Encounter  Procedures   EKG 12-Lead    No orders of the defined types were placed in this encounter.   Patient Instructions  Medication Instructions:  No change *If you need a refill on your cardiac medications before your next appointment, please call your pharmacy*   Lab Work: None If you have labs (blood work) drawn today and your tests are completely normal, you will receive your results only by: [place] (if you have MyChart) OR A paper copy in the mail If you have any lab test that is abnormal or we need to change your treatment, we will call you to review the results.   Testing/Procedures: None   Follow-Up: At [facility], you and your health needs are our priority.  As part of our continuing mission to provide you with  exceptional heart care, we have created designated Provider Care Teams.  These Care Teams include your primary Cardiologist (physician) and Advanced Practice Providers (APPs -  Physician Assistants and Nurse Practitioners) who all work together to provide you with the care you need, when you need it.  We recommend signing up for the patient portal called "MyChart".  Sign up information is provided on this After Visit Summary.  MyChart is used to connect with patients for Virtual Visits (Telemedicine).  Patients are able to view lab/test results, encounter notes, upcoming appointments, etc.  Non-urgent messages can be sent to your provider as well.   To learn more about what you can do with MyChart, go to [email or web address].    Your next appointment:   1 year(s)  The format for your next appointment:   In Person  Provider:   Buford Dresser, MD   Other Instructions   Signed, Buford Dresser, MD PhD 01/10/2021    [place]

## 2021-01-10 NOTE — Progress Notes (Signed)
Location:   [place] Room Number: N21 Place of Service:  SNF (31) Provider:  [email or web address]    Patient Care Team: Virgie Dad, MD as PCP - General (Internal Medicine) Buford Dresser, MD as PCP - Cardiology (Cardiology) Ricard Dillon, MD as Consulting Physician (Internal Medicine) Virgie Dad, MD (Internal Medicine)  Extended Emergency Contact Information Primary Emergency Contact: Gordon,[place] Address: [address]          [place], [place] [address] [name] of [place] Phone: [phone] Mobile Phone: [phone] Relation: Daughter Secondary Emergency Contact: [place] Mobile Phone: [phone] Relation: Daughter  Code Status:  DNR Goals of care: Advanced Directive information Advanced Directives 01/10/2021  Does Patient Have a Medical Advance Directive? Yes  Type of Paramedic of [place];Living will  Does patient want to make changes to medical advance directive? No - Patient declined  Copy of [place] in Chart? Yes - validated most recent copy scanned in chart (See row information)  Pre-existing out of facility DNR order (yellow form or pink MOST form) -     Chief Complaint  Patient presents with   Medical Management of Chronic Issues    Routine follow up    Health Maintenance    Discuss need for shingles vaccine, foot exam, urine microalbumin, and ophthalmology exam.    HPI:  Pt is a 87 y.o. male seen today for medical management of chronic diseases.    He resides on the skilled nursing unit at [facility] due to chronic conditions and bilateral AKA. Past medical history includes: hypertension, PAD, atrial fibrillation, stroke, dysphagia, tyle II diabetes, Chronic kidney disease stage III, constipation, and neuropathic pain.  Seen by cardiology today, no changes to medications, advised to follow up in 1 year.   Candidal dermatitis- skin has improved  with nystatin ointment, denies burning and itching to groin today Afib- rate controlled with metoprolol, eliquis for clot prevention T2DM with CKD- A1c 7.6, BUN/creat 24/ 1.2  08/10/2020, metformin 500 mg bid, no recent hypoglycemic events HLD- LDL 78 08/10/2020, simvastatin daily Bilateral AKA- moves around well with motorized wheelchair, able to transfer himself to bed or toilet, some phantom pain at times, gabapentin 100 mg qhs Neuropathic pain- see above Productive cough- sputum clear/tan and thick, did not care for mucinex.   No recent falls, injuries or behavioral outbursts.   Remains social with others and often seen in courtyard during the day.   BIMS 15/15 01/07/2021  Blood sugars averaging 120-140's.   Recent blood pressures:  07/18- 118/61, 122/67  07/17- 101/61, 123/77  07/16- 94/56, 100/60  Recent weights:  07/01- 111.9 lbs  06/01- 113.6 lbs  05/03- 114.7 lbs  Nurse does not report any other concerns, vitals stable.        Past Medical History:  Diagnosis Date   BPH (benign prostatic hyperplasia) 10/14/2014   Cervicalgia 01/04/2016   Chronic kidney disease    Contusion of left shoulder 05/27/14   DM type 2 (diabetes mellitus, type 2) ([place])    Dysphagia    High blood pressure    History of basal cell cancer 11/21/2005   Right temple   History of colonic polyps 10/14/2003   Hyperlipidemia    Left knee pain 05/27/14   Peripheral neuropathic pain    Peripheral vascular disease ([place])    SAH (subarachnoid hemorrhage) ([place]) 2009   TBI   Stroke [facility])    TIA (transient ischemic attack) 11/06/2005  Left eye pain. Slurred speech. Diaphoresis. No residual effects.    Past Surgical History:  Procedure Laterality Date   ABDOMINAL AORTOGRAM W/LOWER EXTREMITY N/A 06/12/2018   Procedure: ABDOMINAL AORTOGRAM W/LOWER EXTREMITY;  Surgeon: Marty Heck, MD;  Location: [place] CV LAB;  Service: Cardiovascular;  Laterality: N/A;   Achilles tendon repair Left 1998    ruptured tendon   AMPUTATION Left 10/11/2018   Procedure: Left  AMPUTATION ABOVE KNEE;  Surgeon: Marty Heck, MD;  Location: [place];  Service: Vascular;  Laterality: Left;   AMPUTATION Right 02/07/2019   Procedure: AMPUTATION ABOVE KNEE;  Surgeon: Rosetta Posner, MD;  Location: [place];  Service: Vascular;  Laterality: Right;   COLONOSCOPY  1992   Polyp removed   COLONOSCOPY  1998   normal   ORIF FEMUR FRACTURE Right 1929   PERIPHERAL VASCULAR INTERVENTION Left 06/12/2018   Procedure: PERIPHERAL VASCULAR INTERVENTION;  Surgeon: Marty Heck, MD;  Location: [place] [facility];  Service: Cardiovascular;  Laterality: Left;  Attempted    TONSILLECTOMY      Allergies  Allergen Reactions   Tape Other (See Comments)    TAPE WILL TEAR THE SKIN!!!!    Allergies as of 01/10/2021       Reactions   Tape Other (See Comments)   TAPE WILL TEAR THE SKIN!!!!        Medication List        Accurate as of January 10, 2021  3:03 PM. If you have any questions, ask your nurse or doctor.          STOP taking these medications    calcium carbonate 500 MG chewable tablet Commonly known as: TUMS - dosed in mg elemental calcium Stopped by: Yvonna Alanis, NP   loratadine 10 MG tablet Commonly known as: CLARITIN Stopped by: Yvonna Alanis, NP   nystatin ointment Commonly known as: MYCOSTATIN Stopped by: Yvonna Alanis, NP       TAKE these medications    acetaminophen 650 MG CR tablet Commonly known as: TYLENOL Take 650 mg by mouth every 8 (eight) hours as needed for pain.   apixaban 2.5 MG Tabs tablet Commonly known as: ELIQUIS Take 2.5 mg by mouth 2 (two) times daily. Morning: 7am-11am Evening: 7:30pm-11pm   bisacodyl 10 MG suppository Commonly known as: DULCOLAX Place 10 mg rectally daily as needed for moderate constipation.   Calcium Carbonate-Vitamin D 600-400 MG-UNIT tablet Take 1 tablet by mouth daily. Morning: 7am-11am.   gabapentin 100 MG capsule Commonly known  as: NEURONTIN Take 100 mg by mouth at bedtime. 7:30pm-11pm.   metFORMIN 500 MG tablet Commonly known as: GLUCOPHAGE Take 500 mg by mouth 2 (two) times daily with a meal. Morning: 7am-9am Evening: 5pm-7pm.   metoprolol tartrate 25 MG tablet Commonly known as: LOPRESSOR Take 12.5 mg by mouth 2 (two) times daily. Morning: 7am-11am Evening: 7pm-11pm   Propylene Glycol-Glycerin 1-0.3 % Soln Place 2 drops into both eyes daily as needed (dry eyes).   sennosides-docusate sodium 8.6-50 MG tablet Commonly known as: SENOKOT-S Take 2 tablets by mouth at bedtime as needed.   simvastatin 10 MG tablet Commonly known as: ZOCOR Take 10 mg by mouth daily. Between the hours of 5pm-7pm.   vitamin B-12 500 MCG tablet Commonly known as: CYANOCOBALAMIN Take 500 mcg by mouth daily.   Vitamin D3 50 MCG (2000 UT) Tabs Take 1 tablet by mouth daily. What changed: Another medication with the same name was removed. Continue taking  this medication, and follow the directions you see here. Changed by: Yvonna Alanis, NP   zinc oxide 20 % ointment Apply 1 application topically as needed for irritation. To buttocks after every incontinent episode and as needed for redness. May keep at bedside. As Needed        Review of Systems  Constitutional:  Negative for activity change, appetite change, fatigue and fever.  HENT:  Negative for dental problem, hearing loss and trouble swallowing.        Raspy voice  Eyes:  Negative for visual disturbance.       Glasses  Respiratory:  Positive for cough. Negative for shortness of breath and wheezing.   Cardiovascular:  Negative for chest pain.  Gastrointestinal:  Negative for abdominal distention, abdominal pain, constipation, diarrhea and nausea.  Genitourinary:  Negative for dysuria, frequency and hematuria.  Musculoskeletal:  Positive for gait problem.  Skin:  Negative for rash.  Neurological:  Negative for dizziness, weakness and headaches.  Hematological:   Bruises/bleeds easily.  Psychiatric/Behavioral:  Negative for dysphoric mood and sleep disturbance. The patient is not nervous/anxious.    Immunization History  Administered Date(s) Administered   DTaP 12/31/2012   Influenza, High Dose Seasonal PF 04/04/2017, 04/10/2019   Influenza,inj,Quad PF,6+ Mos 03/28/2018   Influenza-Unspecified 04/10/2014, 03/25/2015, 04/06/2016, 03/30/2020   Moderna SARS-COV2 Booster Vaccination 05/10/2020, 12/01/2020   Moderna Sars-Covid-2 Vaccination 06/30/2019, 07/28/2019   PPD Test 04/29/2014   Pneumococcal Conjugate-13 04/28/2017   Pneumococcal-Unspecified 03/13/2011   Td 12/24/2012   Tdap 04/28/2017   Zoster Recombinat (Shingrix) 12/14/2017   Pertinent  Health Maintenance Due  Topic Date Due   FOOT EXAM  06/27/2018   URINE MICROALBUMIN  03/14/2019   OPHTHALMOLOGY EXAM  07/01/2020   INFLUENZA VACCINE  01/24/2021   HEMOGLOBIN A1C  02/07/2021   PNA vac Low Risk Adult  Completed   Fall Risk  01/07/2021 10/02/2018 09/11/2018 08/28/2018 08/08/2018  Falls in the past year? 1 0 0 0 0  Number falls in past yr: 1 0 0 0 -  Injury with Fall? 0 0 0 0 -  Risk for fall due to : History of fall(s);Impaired balance/gait;Impaired mobility - - - -  Follow up Falls evaluation completed;Education provided;Falls prevention discussed - - - -   Functional Status Survey:    Vitals:   01/10/21 1454  BP: 122/67  Pulse: 75  Resp: 20  Temp: (!) 96.8 F (36 C)  SpO2: 97%  Weight: 111 lb 14.4 oz (50.8 kg)  Height: 4\' 10"  (1.473 m)   Body mass index is 23.39 kg/m. Physical Exam Vitals reviewed.  Constitutional:      General: He is not in acute distress. HENT:     Head: Normocephalic.     Right Ear: There is no impacted cerumen.     Left Ear: There is no impacted cerumen.     Nose: Nose normal.     Mouth/Throat:     Mouth: Mucous membranes are moist.  Eyes:     General:        Right eye: No discharge.        Left eye: No discharge.  Neck:     Vascular: No  carotid bruit.  Cardiovascular:     Rate and Rhythm: Normal rate. Rhythm irregular.     Pulses: Normal pulses.     Heart sounds: Normal heart sounds. No murmur heard. Pulmonary:     Effort: Pulmonary effort is normal. No respiratory distress.     Breath  sounds: Normal breath sounds. No wheezing.  Abdominal:     General: Bowel sounds are normal. There is no distension.     Palpations: Abdomen is soft.     Tenderness: There is no abdominal tenderness.  Musculoskeletal:     Cervical back: Normal range of motion.     Right lower leg: No edema.     Left lower leg: No edema.     Comments: Bilateral AKA, stumps skin no breakdown  Lymphadenopathy:     Cervical: No cervical adenopathy.  Skin:    General: Skin is warm and dry.     Capillary Refill: Capillary refill takes less than 2 seconds.     Comments: Skin folds near groin and scrotum intact, no sign of irritation.   Neurological:     General: No focal deficit present.     Mental Status: He is alert and oriented to person, place, and time.     Motor: Weakness present.     Gait: Gait abnormal.     Comments: Motorized wheelchair  Psychiatric:        Mood and Affect: Mood normal.        Behavior: Behavior normal.    Labs reviewed: Recent Labs    04/16/20 0000 08/10/20 0000  NA 136* 139  K 4.5 4.7  CL 97* 102  CO2 31* 31*  BUN 20 24*  CREATININE 1.2 1.2  CALCIUM 9.5 8.8   Recent Labs    04/16/20 0000 08/10/20 0000  AST 14 13*  ALT 12 12  ALKPHOS 65 55  ALBUMIN 3.6 3.1*   Recent Labs    04/16/20 0000 08/10/20 0000 11/04/20 0000  WBC 7.5 8.2 4.8  NEUTROABS 4,688.00 5,150.00 2,390.00  HGB 13.0* 11.5* 12.2*  HCT 39* 34* 37*  PLT 415* 360 320   Lab Results  Component Value Date   TSH 1.72 10/31/2019   Lab Results  Component Value Date   HGBA1C 7.6 08/10/2020   Lab Results  Component Value Date   CHOL 138 08/10/2020   HDL 43 08/10/2020   LDLCALC 78 08/10/2020   TRIG 83 08/10/2020   CHOLHDL 2.6  08/29/2018    Significant Diagnostic Results in last 30 days:  No results found.  Assessment/Plan 1. Paroxysmal atrial fibrillation ([place]) - seen by cardiology 07/18- no medication changes, advised to f/u in 1 year - rate controlled with metoprolol - cont eliquis for clot prevention  2. Controlled type 2 diabetes mellitus with stage 3 chronic kidney disease, without long-term current use of insulin (HCC) - A1c 7.0 08/10/2020 - blood sugars 120-140's - no hypoglycemia  - cont metformin 500 mg bid  3. Hyperlipidemia LDL goal <70 - LDL 78 08/10/2020 - cont statin  4. S/P AKA (above knee amputation) bilateral ([place]) - moves well with motorized wheelchair, some phantom pain- not often - cont gabapentin 100 mg qhs - cont skilled nursing care  5. Cough productive of clear sputum - ongoing, no changes in sputum amount or color - did not like mucinex on past  6. Neuropathic pain - stable with gabapentin 100 mg qhs  7. Candidal dermatitis - resolved    Family/ staff Communication: plan discussed with patient and nurse  Labs/tests ordered:  none

## 2021-01-17 DIAGNOSIS — Z89612 Acquired absence of left leg above knee: Secondary | ICD-10-CM | POA: Diagnosis not present

## 2021-01-17 DIAGNOSIS — M6281 Muscle weakness (generalized): Secondary | ICD-10-CM | POA: Diagnosis not present

## 2021-01-17 DIAGNOSIS — Z4781 Encounter for orthopedic aftercare following surgical amputation: Secondary | ICD-10-CM | POA: Diagnosis not present

## 2021-01-27 ENCOUNTER — Non-Acute Institutional Stay (SKILLED_NURSING_FACILITY): Payer: PPO | Admitting: Internal Medicine

## 2021-01-27 ENCOUNTER — Encounter: Payer: Self-pay | Admitting: Internal Medicine

## 2021-01-27 DIAGNOSIS — E871 Hypo-osmolality and hyponatremia: Secondary | ICD-10-CM

## 2021-01-27 DIAGNOSIS — L821 Other seborrheic keratosis: Secondary | ICD-10-CM | POA: Diagnosis not present

## 2021-01-27 DIAGNOSIS — Z8673 Personal history of transient ischemic attack (TIA), and cerebral infarction without residual deficits: Secondary | ICD-10-CM

## 2021-01-27 DIAGNOSIS — Z89612 Acquired absence of left leg above knee: Secondary | ICD-10-CM | POA: Diagnosis not present

## 2021-01-27 DIAGNOSIS — L570 Actinic keratosis: Secondary | ICD-10-CM | POA: Diagnosis not present

## 2021-01-27 DIAGNOSIS — N183 Chronic kidney disease, stage 3 unspecified: Secondary | ICD-10-CM

## 2021-01-27 DIAGNOSIS — L814 Other melanin hyperpigmentation: Secondary | ICD-10-CM | POA: Diagnosis not present

## 2021-01-27 DIAGNOSIS — I480 Paroxysmal atrial fibrillation: Secondary | ICD-10-CM | POA: Diagnosis not present

## 2021-01-27 DIAGNOSIS — Z89611 Acquired absence of right leg above knee: Secondary | ICD-10-CM | POA: Diagnosis not present

## 2021-01-27 DIAGNOSIS — M792 Neuralgia and neuritis, unspecified: Secondary | ICD-10-CM | POA: Diagnosis not present

## 2021-01-27 DIAGNOSIS — E785 Hyperlipidemia, unspecified: Secondary | ICD-10-CM

## 2021-01-27 DIAGNOSIS — E1122 Type 2 diabetes mellitus with diabetic chronic kidney disease: Secondary | ICD-10-CM | POA: Diagnosis not present

## 2021-01-27 NOTE — Progress Notes (Signed)
Location:   [place] Room Number: 21 Place of Service:  SNF [phone]) Provider:  Veleta Miners MD  Virgie Dad, MD  Patient Care Team: Virgie Dad, MD as PCP - General (Internal Medicine) Buford Dresser, MD as PCP - Cardiology (Cardiology) Ricard Dillon, MD as Consulting Physician (Internal Medicine) Virgie Dad, MD (Internal Medicine)  Extended Emergency Contact Information Primary Emergency Contact: Gordon,[place] Address: [address]          [place], [place] [address] [name] of [place] Phone: [phone] Mobile Phone: [phone] Relation: Daughter Secondary Emergency Contact: [place] Mobile Phone: [phone] Relation: Daughter  Code Status:  DNR Managed Care Goals of care: Advanced Directive information Advanced Directives 01/27/2021  Does Patient Have a Medical Advance Directive? Yes  Type of Paramedic of [place];Living will  Does patient want to make changes to medical advance directive? No - Patient declined  Copy of [place] in Chart? Yes - validated most recent copy scanned in chart (See row information)  Pre-existing out of facility DNR order (yellow form or pink MOST form) -     Chief Complaint  Patient presents with   Medical Management of Chronic Issues   Health Maintenance    Foot exam, urine microalbumin, eye exam, influenza vaccine    HPI:  Pt is a 87 y.o. male seen today for medical management of chronic diseases.    Long term care resident   Patient has h/o Hypertension, Hyperlipidemia, Diabetes mellitus, h/o Brain stem Infarct s/p TPA  In 10/19, Hyponatremia, Atrial Fibrillation on Eliquis since 10/19.,PAD s/p Bilateral AKA  with first Left AKA and then Right AKA   Doing well. Uses Power wheelchair  No Pain. Has lost some weight but says his appetite is good No Other issues Did get diagnose with Covid in 5/22   Past Medical History:   Diagnosis Date   BPH (benign prostatic hyperplasia) 10/14/2014   Cervicalgia 01/04/2016   Chronic kidney disease    Contusion of left shoulder 05/27/14   DM type 2 (diabetes mellitus, type 2) ([place])    Dysphagia    High blood pressure    History of basal cell cancer 11/21/2005   Right temple   History of colonic polyps 10/14/2003   Hyperlipidemia    Left knee pain 05/27/14   Peripheral neuropathic pain    Peripheral vascular disease ([place])    SAH (subarachnoid hemorrhage) ([place]) 2009   TBI   Stroke [facility])    TIA (transient ischemic attack) 11/06/2005   Left eye pain. Slurred speech. Diaphoresis. No residual effects.    Past Surgical History:  Procedure Laterality Date   ABDOMINAL AORTOGRAM W/LOWER EXTREMITY N/A 06/12/2018   Procedure: ABDOMINAL AORTOGRAM W/LOWER EXTREMITY;  Surgeon: Marty Heck, MD;  Location: [place] [facility];  Service: Cardiovascular;  Laterality: N/A;   Achilles tendon repair Left 1998   ruptured tendon   AMPUTATION Left 10/11/2018   Procedure: Left  AMPUTATION ABOVE KNEE;  Surgeon: Marty Heck, MD;  Location: [facility];  Service: Vascular;  Laterality: Left;   AMPUTATION Right 02/07/2019   Procedure: AMPUTATION ABOVE KNEE;  Surgeon: Rosetta Posner, MD;  Location: [place];  Service: Vascular;  Laterality: Right;   COLONOSCOPY  1992   Polyp removed   COLONOSCOPY  1998   normal   ORIF FEMUR FRACTURE Right 1929   PERIPHERAL VASCULAR INTERVENTION Left 06/12/2018   Procedure: PERIPHERAL VASCULAR INTERVENTION;  Surgeon: Monica Martinez  J, MD;  Location: [place] [facility];  Service: Cardiovascular;  Laterality: Left;  Attempted    TONSILLECTOMY      Allergies  Allergen Reactions   Tape Other (See Comments)    TAPE WILL TEAR THE SKIN!!!!    Allergies as of 01/27/2021       Reactions   Tape Other (See Comments)   TAPE WILL TEAR THE SKIN!!!!        Medication List        Accurate as of January 27, 2021 10:46 AM. If you have any questions, ask  your nurse or doctor.          acetaminophen 650 MG CR tablet Commonly known as: TYLENOL Take 650 mg by mouth every 8 (eight) hours as needed for pain.   apixaban 2.5 MG Tabs tablet Commonly known as: ELIQUIS Take 2.5 mg by mouth 2 (two) times daily. Morning: 7am-11am Evening: 7:30pm-11pm   bisacodyl 10 MG suppository Commonly known as: DULCOLAX Place 10 mg rectally daily as needed for moderate constipation.   Calcium Carbonate-Vitamin D 600-400 MG-UNIT tablet Take 1 tablet by mouth daily. Morning: 7am-11am.   cholecalciferol 25 MCG (1000 UNIT) tablet Commonly known as: VITAMIN D Take 1 tablet by mouth daily.   gabapentin 100 MG capsule Commonly known as: NEURONTIN Take 100 mg by mouth at bedtime. 7:30pm-11pm.   metFORMIN 500 MG tablet Commonly known as: GLUCOPHAGE Take 500 mg by mouth 2 (two) times daily with a meal. Morning: 7am-9am Evening: 5pm-7pm.   metoprolol tartrate 25 MG tablet Commonly known as: LOPRESSOR Take 12.5 mg by mouth 2 (two) times daily. Morning: 7am-11am Evening: 7pm-11pm   Propylene Glycol-Glycerin 1-0.3 % Soln Place 2 drops into both eyes daily as needed (dry eyes).   sennosides-docusate sodium 8.6-50 MG tablet Commonly known as: SENOKOT-S Take 2 tablets by mouth at bedtime as needed.   simvastatin 10 MG tablet Commonly known as: ZOCOR Take 10 mg by mouth daily. Between the hours of 5pm-7pm.   vitamin B-12 500 MCG tablet Commonly known as: CYANOCOBALAMIN Take 500 mcg by mouth daily.   zinc oxide 20 % ointment Apply 1 application topically as needed for irritation. To buttocks after every incontinent episode and as needed for redness. May keep at bedside. As Needed        Review of Systems Review of Systems  Constitutional: Negative for activity change, appetite change, chills, diaphoresis, fatigue and fever.  HENT: Negative for mouth sores, postnasal drip, rhinorrhea, sinus pain and sore throat.   Respiratory: Negative for  apnea, cough, chest tightness, shortness of breath and wheezing.   Cardiovascular: Negative for chest pain, palpitations and leg swelling.  Gastrointestinal: Negative for abdominal distention, abdominal pain, constipation, diarrhea, nausea and vomiting.  Genitourinary: Negative for dysuria and frequency.  Musculoskeletal: Negative for arthralgias, joint swelling and myalgias.  Skin: Negative for rash.  Neurological: Negative for dizziness, syncope, weakness, light-headedness and numbness.  Psychiatric/Behavioral: Negative for behavioral problems, confusion and sleep disturbance.    Immunization History  Administered Date(s) Administered   DTaP 12/31/2012   Influenza, High Dose Seasonal PF 04/04/2017, 04/10/2019   Influenza,inj,Quad PF,6+ Mos 03/28/2018   Influenza-Unspecified 04/10/2014, 03/25/2015, 04/06/2016, 03/30/2020   Moderna SARS-COV2 Booster Vaccination 05/10/2020, 12/01/2020   Moderna Sars-Covid-2 Vaccination 06/30/2019, 07/28/2019   PPD Test 04/29/2014   Pneumococcal Conjugate-13 04/28/2017   Pneumococcal-Unspecified 03/13/2011   Td 12/24/2012   Tdap 04/28/2017   Zoster Recombinat (Shingrix) 10/05/2017, 12/14/2017   Pertinent  Health Maintenance Due  Topic Date  Due   FOOT EXAM  06/27/2018   URINE MICROALBUMIN  03/14/2019   OPHTHALMOLOGY EXAM  07/01/2020   INFLUENZA VACCINE  01/24/2021   HEMOGLOBIN A1C  02/07/2021   PNA vac Low Risk Adult  Completed   Fall Risk  01/07/2021 10/02/2018 09/11/2018 08/28/2018 08/08/2018  Falls in the past year? 1 0 0 0 0  Number falls in past yr: 1 0 0 0 -  Injury with Fall? 0 0 0 0 -  Risk for fall due to : History of fall(s);Impaired balance/gait;Impaired mobility - - - -  Follow up Falls evaluation completed;Education provided;Falls prevention discussed - - - -   Functional Status Survey:    Vitals:   01/27/21 1024  BP: 102/63  Pulse: 78  Resp: 20  Temp: (!) 97.2 F (36.2 C)  SpO2: 95%  Weight: 108 lb 1.6 oz (49 kg)  Height: 3'  8" (1.118 m)   Body mass index is 39.26 kg/m. Physical Exam Constitutional: Oriented to person, place, and time. Well-developed and well-nourished.  HENT:  Head: Normocephalic.  Mouth/Throat: Oropharynx is clear and moist.  Eyes: Pupils are equal, round, and reactive to light.  Neck: Neck supple.  Cardiovascular: Normal rate and normal heart sounds.  Murmur Present Pulmonary/Chest: Effort normal and breath sounds normal. No respiratory distress. No wheezes. She has no rales.  Abdominal: Soft. Bowel sounds are normal. No distension. There is no tenderness. There is no rebound.  Musculoskeletal: Bilateral AKA Lymphadenopathy: none Neurological: Alert and oriented to person, place, and time.  Skin: Skin is warm and dry.  Psychiatric: Normal mood and affect. Behavior is normal. Thought content normal.   Labs reviewed: Recent Labs    04/16/20 0000 08/10/20 0000  NA 136* 139  K 4.5 4.7  CL 97* 102  CO2 31* 31*  BUN 20 24*  CREATININE 1.2 1.2  CALCIUM 9.5 8.8   Recent Labs    04/16/20 0000 08/10/20 0000  AST 14 13*  ALT 12 12  ALKPHOS 65 55  ALBUMIN 3.6 3.1*   Recent Labs    04/16/20 0000 08/10/20 0000 11/04/20 0000  WBC 7.5 8.2 4.8  NEUTROABS 4,688.00 5,150.00 2,390.00  HGB 13.0* 11.5* 12.2*  HCT 39* 34* 37*  PLT 415* 360 320   Lab Results  Component Value Date   TSH 1.72 10/31/2019   Lab Results  Component Value Date   HGBA1C 7.6 08/10/2020   Lab Results  Component Value Date   CHOL 138 08/10/2020   HDL 43 08/10/2020   LDLCALC 78 08/10/2020   TRIG 83 08/10/2020   CHOLHDL 2.6 08/29/2018    Significant Diagnostic Results in last 30 days:  No results found.  Assessment/Plan Paroxysmal atrial fibrillation (HCC) Eliquis and Lopressor Controlled type 2 diabetes mellitus with stage 3 chronic kidney disease, without long-term current use of insulin (HCC) A1C Good level On Metformin Hyperlipidemia LDL goal <70 LDL less then 100 S/P AKA (above knee  amputation) bilateral (HCC) Doing well Neuropathic pain On Neurontin History of CVA (cerebrovascular accident) On Statin and Eliquis Hyponatremia Sodium stable   Family/ staff Communication:   Labs/tests ordered:

## 2021-03-02 ENCOUNTER — Non-Acute Institutional Stay (SKILLED_NURSING_FACILITY): Payer: PPO | Admitting: Orthopedic Surgery

## 2021-03-02 ENCOUNTER — Encounter: Payer: Self-pay | Admitting: Orthopedic Surgery

## 2021-03-02 DIAGNOSIS — M792 Neuralgia and neuritis, unspecified: Secondary | ICD-10-CM

## 2021-03-02 DIAGNOSIS — R058 Other specified cough: Secondary | ICD-10-CM | POA: Diagnosis not present

## 2021-03-02 DIAGNOSIS — E1122 Type 2 diabetes mellitus with diabetic chronic kidney disease: Secondary | ICD-10-CM | POA: Diagnosis not present

## 2021-03-02 DIAGNOSIS — Z89611 Acquired absence of right leg above knee: Secondary | ICD-10-CM

## 2021-03-02 DIAGNOSIS — Z89612 Acquired absence of left leg above knee: Secondary | ICD-10-CM

## 2021-03-02 DIAGNOSIS — N183 Chronic kidney disease, stage 3 unspecified: Secondary | ICD-10-CM | POA: Diagnosis not present

## 2021-03-02 DIAGNOSIS — E785 Hyperlipidemia, unspecified: Secondary | ICD-10-CM

## 2021-03-02 DIAGNOSIS — I480 Paroxysmal atrial fibrillation: Secondary | ICD-10-CM

## 2021-03-02 NOTE — Progress Notes (Signed)
Location:   [facility] Room Number: N21 Place of Service:  SNF (31) Provider:  Windell Moulding, NP    Patient Care Team: Virgie Dad, MD as PCP - General (Internal Medicine) Buford Dresser, MD as PCP - Cardiology (Cardiology) Ricard Dillon, MD as Consulting Physician (Internal Medicine) Virgie Dad, MD (Internal Medicine)  Extended Emergency Contact Information Primary Emergency Contact: Gordon,[place] Address: [address]          [place], [place] [address] [name] of [place] Phone: [phone] Mobile Phone: [phone] Relation: Daughter Secondary Emergency Contact: [place] Mobile Phone: [phone] Relation: Daughter  Code Status:  DNR Goals of care: Advanced Directive information Advanced Directives 03/02/2021  Does Patient Have a Medical Advance Directive? Yes  Type of Paramedic of [place];Living will;Out of facility DNR (pink MOST or yellow form)  Does patient want to make changes to medical advance directive? No - Patient declined  Copy of [place] in Chart? Yes - validated most recent copy scanned in chart (See row information)  Pre-existing out of facility DNR order (yellow form or pink MOST form) Yellow form placed in chart (order not valid for inpatient use)     Chief Complaint  Patient presents with   Medical Management of Chronic Issues    Routine follow up    Health Maintenance    Discuss need for foot exam, urine microablumin, ophthalmology exam, influenza vaccine, and hemoglobin A1c    HPI:  Pt is a 87 y.o. male seen today for medical management of chronic diseases.    He resides on the skilled nursing unit at [facility] due to chronic conditions and bilateral AKA. Past medical history includes: hypertension, PAD, atrial fibrillation, stroke, dysphagia, tyle II diabetes, Chronic kidney disease stage III, constipation, and neuropathic  pain.  Afib- rate controlled with metoprolol, remains on eliquis for clot prevention T2DM- a1c 7.6 08/10/2020, metformin 500 mg bid, no recent hypoglycemia CKD- BUN/creat 24/1.2 08/10/2020 HLD- LDL 78 08/10/2020, remains on statin Bilateral AKA- ambulates with power wheelchair, transfers well from chair to toilet and bed, intermittent phantom pain, gabapentin qhs Neuropathic pain- see above Productive cough- continues to cough thick tan/clear sputum often, I observed him coughing several times during out encounter, unsuccessful trial of mucinex in past  Brother recently passed away. He reports being sad for a few days. Daughter was willing to drive him to service in [place] but after working with OT, he decided not to go. He was worried about falling while with family.   No recent falls or injuries.   Recent blood pressures:  09/07- 99/59  09/06- 94/51, 112/64  09/05- 106/59, 117/55   Recent weights:  09/01- 108 lbs  08/02- 108.1 lbs  07/01- 111.9 lbs  Nurse does not report any concerns, vitals stable.      Past Medical History:  Diagnosis Date   BPH (benign prostatic hyperplasia) 10/14/2014   Cervicalgia 01/04/2016   Chronic kidney disease    Contusion of left shoulder 05/27/14   DM type 2 (diabetes mellitus, type 2) ([place])    Dysphagia    High blood pressure    History of basal cell cancer 11/21/2005   Right temple   History of colonic polyps 10/14/2003   Hyperlipidemia    Left knee pain 05/27/14   Peripheral neuropathic pain    Peripheral vascular disease ([place])    SAH (subarachnoid hemorrhage) ([place]) 2009   TBI   Stroke ([place])  TIA (transient ischemic attack) 11/06/2005   Left eye pain. Slurred speech. Diaphoresis. No residual effects.    Past Surgical History:  Procedure Laterality Date   ABDOMINAL AORTOGRAM W/LOWER EXTREMITY N/A 06/12/2018   Procedure: ABDOMINAL AORTOGRAM W/LOWER EXTREMITY;  Surgeon: Marty Heck, MD;  Location: [facility];   Service: Cardiovascular;  Laterality: N/A;   Achilles tendon repair Left 1998   ruptured tendon   AMPUTATION Left 10/11/2018   Procedure: Left  AMPUTATION ABOVE KNEE;  Surgeon: Marty Heck, MD;  Location: [facility];  Service: Vascular;  Laterality: Left;   AMPUTATION Right 02/07/2019   Procedure: AMPUTATION ABOVE KNEE;  Surgeon: Rosetta Posner, MD;  Location: [facility];  Service: Vascular;  Laterality: Right;   COLONOSCOPY  1992   Polyp removed   COLONOSCOPY  1998   normal   ORIF FEMUR FRACTURE Right 1929   PERIPHERAL VASCULAR INTERVENTION Left 06/12/2018   Procedure: PERIPHERAL VASCULAR INTERVENTION;  Surgeon: Marty Heck, MD;  Location: [facility];  Service: Cardiovascular;  Laterality: Left;  Attempted    TONSILLECTOMY      Allergies  Allergen Reactions   Tape Other (See Comments)    TAPE WILL TEAR THE SKIN!!!!    Allergies as of 03/02/2021       Reactions   Tape Other (See Comments)   TAPE WILL TEAR THE SKIN!!!!        Medication List        Accurate as of March 02, 2021  9:54 AM. If you have any questions, ask your nurse or doctor.          acetaminophen 650 MG CR tablet Commonly known as: TYLENOL Take 650 mg by mouth every 8 (eight) hours as needed for pain.   apixaban 2.5 MG Tabs tablet Commonly known as: ELIQUIS Take 2.5 mg by mouth 2 (two) times daily. Morning: 7am-11am Evening: 7:30pm-11pm   bisacodyl 10 MG suppository Commonly known as: DULCOLAX Place 10 mg rectally daily as needed for moderate constipation.   Calcium Carbonate-Vitamin D 600-400 MG-UNIT tablet Take 1 tablet by mouth daily. Morning: 7am-11am.   cholecalciferol 25 MCG (1000 UNIT) tablet Commonly known as: VITAMIN D Take 1 tablet by mouth daily.   gabapentin 100 MG capsule Commonly known as: NEURONTIN Take 100 mg by mouth at bedtime. 7:30pm-11pm.   metFORMIN 500 MG tablet Commonly known as: GLUCOPHAGE Take 500 mg by mouth 2 (two) times daily with a meal.  Morning: 7am-9am Evening: 5pm-7pm.   metoprolol tartrate 25 MG tablet Commonly known as: LOPRESSOR Take 12.5 mg by mouth 2 (two) times daily. Morning: 7am-11am Evening: 7pm-11pm   Propylene Glycol-Glycerin 1-0.3 % Soln Place 2 drops into both eyes daily as needed (dry eyes).   sennosides-docusate sodium 8.6-50 MG tablet Commonly known as: SENOKOT-S Take 2 tablets by mouth at bedtime as needed.   simvastatin 10 MG tablet Commonly known as: ZOCOR Take 10 mg by mouth daily. Between the hours of 5pm-7pm.   vitamin B-12 500 MCG tablet Commonly known as: CYANOCOBALAMIN Take 500 mcg by mouth daily.   zinc oxide 20 % ointment Apply 1 application topically as needed for irritation. To buttocks after every incontinent episode and as needed for redness. May keep at bedside. As Needed        Review of Systems  Constitutional:  Negative for activity change, appetite change, fatigue and fever.  HENT:  Negative for congestion, dental problem, hearing loss and trouble swallowing.   Eyes:  Negative  for photophobia and visual disturbance.       Glasses  Respiratory:  Positive for cough. Negative for choking, shortness of breath and wheezing.   Cardiovascular:  Negative for chest pain.  Gastrointestinal:  Negative for abdominal distention, blood in stool, constipation, diarrhea and nausea.  Genitourinary:  Negative for dysuria, frequency and hematuria.  Musculoskeletal:  Positive for gait problem and myalgias. Negative for arthralgias.  Skin: Negative.   Neurological:  Positive for weakness. Negative for dizziness and light-headedness.  Psychiatric/Behavioral:  Negative for confusion, dysphoric mood and sleep disturbance. The patient is not nervous/anxious.    Immunization History  Administered Date(s) Administered   DTaP 12/31/2012   Influenza, High Dose Seasonal PF 04/04/2017, 04/10/2019   Influenza,inj,Quad PF,6+ Mos 03/28/2018   Influenza-Unspecified 04/10/2014, 03/25/2015,  04/06/2016, 03/30/2020   Moderna SARS-COV2 Booster Vaccination 05/10/2020, 12/01/2020   Moderna Sars-Covid-2 Vaccination 06/30/2019, 07/28/2019   PPD Test 04/29/2014   Pneumococcal Conjugate-13 04/28/2017   Pneumococcal-Unspecified 03/13/2011   Td 12/24/2012   Tdap 04/28/2017   Zoster Recombinat (Shingrix) 10/05/2017, 12/14/2017   Pertinent  Health Maintenance Due  Topic Date Due   FOOT EXAM  06/27/2018   URINE MICROALBUMIN  03/14/2019   OPHTHALMOLOGY EXAM  07/01/2020   INFLUENZA VACCINE  01/24/2021   HEMOGLOBIN A1C  02/07/2021   PNA vac Low Risk Adult  Completed   Fall Risk  01/07/2021 10/02/2018 09/11/2018 08/28/2018 08/08/2018  Falls in the past year? 1 0 0 0 0  Number falls in past yr: 1 0 0 0 -  Injury with Fall? 0 0 0 0 -  Risk for fall due to : History of fall(s);Impaired balance/gait;Impaired mobility - - - -  Follow up Falls evaluation completed;Education provided;Falls prevention discussed - - - -   Functional Status Survey:    Vitals:   03/02/21 0949  BP: (!) 99/59  Pulse: 73  Resp: 18  Temp: 97.9 F (36.6 C)  SpO2: 95%  Weight: 108 lb (49 kg)  Height: 3\' 8"  (1.118 m)   Body mass index is 39.22 kg/m. Physical Exam Vitals reviewed.  Constitutional:      General: He is not in acute distress. HENT:     Head: Normocephalic.     Right Ear: There is no impacted cerumen.     Left Ear: There is no impacted cerumen.     Nose: Nose normal.     Mouth/Throat:     Mouth: Mucous membranes are moist.  Eyes:     General:        Right eye: No discharge.        Left eye: No discharge.  Neck:     Vascular: No carotid bruit.  Cardiovascular:     Rate and Rhythm: Normal rate. Rhythm irregular.     Pulses: Normal pulses.     Heart sounds: Murmur heard.  Pulmonary:     Effort: Pulmonary effort is normal. No respiratory distress.     Breath sounds: Normal breath sounds. No wheezing.  Abdominal:     General: Bowel sounds are normal. There is no distension.      Palpations: Abdomen is soft.     Tenderness: There is no abdominal tenderness.  Musculoskeletal:     Cervical back: Normal range of motion.     Right lower leg: No edema.     Left lower leg: No edema.     Comments: Bilateral AKA, stump CDI, no sign of skin breakdown  Lymphadenopathy:     Cervical: No cervical adenopathy.  Skin:    General: Skin is warm and dry.     Capillary Refill: Capillary refill takes less than 2 seconds.  Neurological:     General: No focal deficit present.     Mental Status: He is alert and oriented to person, place, and time.     Motor: Weakness present.     Gait: Gait abnormal.     Comments: Power wheelchair  Psychiatric:        Mood and Affect: Mood normal.        Behavior: Behavior normal.    Labs reviewed: Recent Labs    04/16/20 0000 08/10/20 0000  NA 136* 139  K 4.5 4.7  CL 97* 102  CO2 31* 31*  BUN 20 24*  CREATININE 1.2 1.2  CALCIUM 9.5 8.8   Recent Labs    04/16/20 0000 08/10/20 0000  AST 14 13*  ALT 12 12  ALKPHOS 65 55  ALBUMIN 3.6 3.1*   Recent Labs    04/16/20 0000 08/10/20 0000 11/04/20 0000  WBC 7.5 8.2 4.8  NEUTROABS 4,688.00 5,150.00 2,390.00  HGB 13.0* 11.5* 12.2*  HCT 39* 34* 37*  PLT 415* 360 320   Lab Results  Component Value Date   TSH 1.72 10/31/2019   Lab Results  Component Value Date   HGBA1C 7.6 08/10/2020   Lab Results  Component Value Date   CHOL 138 08/10/2020   HDL 43 08/10/2020   LDLCALC 78 08/10/2020   TRIG 83 08/10/2020   CHOLHDL 2.6 08/29/2018    Significant Diagnostic Results in last 30 days:  No results found.  Assessment/Plan 1. Paroxysmal atrial fibrillation (HCC) - rate controlled with metoprolol - cont eliquis for clot prevention  2. Controlled type 2 diabetes mellitus with stage 3 chronic kidney disease, without long-term current use of insulin (HCC) - a1c 7.6 08/10/2020 - cont metformin  - cont to limit carbs and sugars in diet - a1c - cmp - cbc/diff  3.  Hyperlipidemia LDL goal <70 - LDL 78 08/10/2020 - cont statin - lipid panel  4. S/P AKA (above knee amputation) bilateral ([place]) - cont skilled nursing care  5. Neuropathic pain - stable with gabapentin qhs  6. Cough productive of clear sputum - ongoing, stimulated with talking - unsuccessful trial of mucinex    Family/ staff Communication: plan discussed with patient and nurse  Labs/tests ordered:  cbc/diff, cmp, a1c, lipid panel

## 2021-03-03 DIAGNOSIS — E1122 Type 2 diabetes mellitus with diabetic chronic kidney disease: Secondary | ICD-10-CM | POA: Diagnosis not present

## 2021-03-03 DIAGNOSIS — N183 Chronic kidney disease, stage 3 unspecified: Secondary | ICD-10-CM | POA: Diagnosis not present

## 2021-03-03 DIAGNOSIS — E785 Hyperlipidemia, unspecified: Secondary | ICD-10-CM | POA: Diagnosis not present

## 2021-03-03 LAB — CBC AND DIFFERENTIAL
HCT: 36 — AB (ref 41–53)
Hemoglobin: 12.2 — AB (ref 13.5–17.5)
Neutrophils Absolute: 4308
Platelets: 365 (ref 150–399)
WBC: 6.7

## 2021-03-03 LAB — LIPID PANEL
Cholesterol: 130 (ref 0–200)
HDL: 43 (ref 35–70)
LDL Cholesterol: 71
LDl/HDL Ratio: 3
Triglycerides: 82 (ref 40–160)

## 2021-03-03 LAB — BASIC METABOLIC PANEL
BUN: 23 — AB (ref 4–21)
CO2: 30 — AB (ref 13–22)
Chloride: 100 (ref 99–108)
Creatinine: 1 (ref 0.6–1.3)
Glucose: 187
Potassium: 4.4 (ref 3.4–5.3)
Sodium: 136 — AB (ref 137–147)

## 2021-03-03 LAB — HEPATIC FUNCTION PANEL
ALT: 9 — AB (ref 10–40)
AST: 15 (ref 14–40)
Alkaline Phosphatase: 53 (ref 25–125)
Bilirubin, Total: 0.6

## 2021-03-03 LAB — HEMOGLOBIN A1C: Hemoglobin A1C: 6.8

## 2021-03-03 LAB — COMPREHENSIVE METABOLIC PANEL
Albumin: 3.3 — AB (ref 3.5–5.0)
Calcium: 9.1 (ref 8.7–10.7)
Globulin: 2.4

## 2021-03-03 LAB — CBC: RBC: 3.61 — AB (ref 3.87–5.11)

## 2021-03-15 DIAGNOSIS — M6281 Muscle weakness (generalized): Secondary | ICD-10-CM | POA: Diagnosis not present

## 2021-03-15 DIAGNOSIS — R293 Abnormal posture: Secondary | ICD-10-CM | POA: Diagnosis not present

## 2021-03-30 ENCOUNTER — Encounter: Payer: Self-pay | Admitting: Orthopedic Surgery

## 2021-03-30 ENCOUNTER — Non-Acute Institutional Stay (SKILLED_NURSING_FACILITY): Payer: PPO | Admitting: Orthopedic Surgery

## 2021-03-30 DIAGNOSIS — I9589 Other hypotension: Secondary | ICD-10-CM | POA: Diagnosis not present

## 2021-03-30 DIAGNOSIS — Z89611 Acquired absence of right leg above knee: Secondary | ICD-10-CM

## 2021-03-30 DIAGNOSIS — R058 Other specified cough: Secondary | ICD-10-CM

## 2021-03-30 DIAGNOSIS — Z2821 Immunization not carried out because of patient refusal: Secondary | ICD-10-CM

## 2021-03-30 DIAGNOSIS — I480 Paroxysmal atrial fibrillation: Secondary | ICD-10-CM | POA: Diagnosis not present

## 2021-03-30 DIAGNOSIS — M792 Neuralgia and neuritis, unspecified: Secondary | ICD-10-CM

## 2021-03-30 DIAGNOSIS — N183 Chronic kidney disease, stage 3 unspecified: Secondary | ICD-10-CM

## 2021-03-30 DIAGNOSIS — E785 Hyperlipidemia, unspecified: Secondary | ICD-10-CM | POA: Diagnosis not present

## 2021-03-30 DIAGNOSIS — Z89612 Acquired absence of left leg above knee: Secondary | ICD-10-CM

## 2021-03-30 DIAGNOSIS — E1122 Type 2 diabetes mellitus with diabetic chronic kidney disease: Secondary | ICD-10-CM

## 2021-03-30 NOTE — Progress Notes (Signed)
Location:   [facility] Room Number: 21 Place of Service:  SNF [phone]) Provider:  Windell Moulding, NP  Virgie Dad, MD  Patient Care Team: Virgie Dad, MD as PCP - General (Internal Medicine) Buford Dresser, MD as PCP - Cardiology (Cardiology) Ricard Dillon, MD as Consulting Physician (Internal Medicine) Virgie Dad, MD (Internal Medicine)  Extended Emergency Contact Information Primary Emergency Contact: Gordon,[place] Address: [address]          [place], [place] [address] [name] of [place] Phone: [phone] Mobile Phone: [phone] Relation: Daughter Secondary Emergency Contact: [place] Mobile Phone: [phone] Relation: Daughter  Code Status:  DNR Goals of care: Advanced Directive information Advanced Directives 03/30/2021  Does Patient Have a Medical Advance Directive? Yes  Type of Paramedic of [place];Living will;Out of facility DNR (pink MOST or yellow form)  Does patient want to make changes to medical advance directive? No - Patient declined  Copy of [place] in Chart? Yes - validated most recent copy scanned in chart (See row information)  Pre-existing out of facility DNR order (yellow form or pink MOST form) Yellow form placed in chart (order not valid for inpatient use)     Chief Complaint  Patient presents with   Medical Management of Chronic Issues    Routine follow up visit.   Health Maintenance    Foot exam,Urine microalbumin, eye exam, flu vaccine, Hemoglobin A1C, 3rd COVID booster    HPI:  Pt is a 86 y.o. male seen today for medical management of chronic diseases.    He resides on the skilled nursing unit at [facility] due to chronic conditions and bilateral AKA. Past medical history includes: hypertension, PAD, atrial fibrillation, stroke, dysphagia, tyle II diabetes, Chronic kidney disease stage III, constipation, and neuropathic  pain.  Afib- rate controlled with metoprolol, remains on eliquis for clot prevention T2DM- a1c 7.6 08/10/2020, metformin 500 mg bid, no recent hypoglycemia, blood sugars averaging 100-150's CKD stage 3- BUN/creat 24/1.2 08/10/2020 HLD- LDL 78 08/10/2020, remains on statin Bilateral AKA- ambulates with power wheelchair, transfers well from chair to toilet and bed, intermittent phantom pain, gabapentin qhs Neuropathic pain- see above Productive cough- continues to cough thick tan/clear sputum often,appears cough stimulated with talking today, unsuccessful trial of mucinex in past  Recently celebrated his 69th birthday, daughter brought him a cake, he visited with children.   Dental cleaning/exam 10/04, plans to have #3 pulled next week, denies dental pain.   Refused to get 3rd covid vaccine d/t cardiac concerns.   Plans to get flu vaccine later this month.   No recent falls, injuries or behavioral outbursts.   Recent blood pressures:  10/05- 84/53, 115/63  10/04- 105/55, 92/54  10/03- 108/59, 109/64  Recent weights:  10/01- no recorded weight  09/01- 108 lbs  08/02- 108.1 lbs  Past Medical History:  Diagnosis Date   BPH (benign prostatic hyperplasia) 10/14/2014   Cervicalgia 01/04/2016   Chronic kidney disease    Contusion of left shoulder 05/27/14   DM type 2 (diabetes mellitus, type 2) ([place])    Dysphagia    High blood pressure    History of basal cell cancer 11/21/2005   Right temple   History of colonic polyps 10/14/2003   Hyperlipidemia    Left knee pain 05/27/14   Peripheral neuropathic pain    Peripheral vascular disease ([place])    SAH (subarachnoid hemorrhage) ([place]) 2009   TBI  Stroke [facility])    TIA (transient ischemic attack) 11/06/2005   Left eye pain. Slurred speech. Diaphoresis. No residual effects.    Past Surgical History:  Procedure Laterality Date   ABDOMINAL AORTOGRAM W/LOWER EXTREMITY N/A 06/12/2018   Procedure: ABDOMINAL AORTOGRAM W/LOWER EXTREMITY;   Surgeon: Marty Heck, MD;  Location: [facility];  Service: Cardiovascular;  Laterality: N/A;   Achilles tendon repair Left 1998   ruptured tendon   AMPUTATION Left 10/11/2018   Procedure: Left  AMPUTATION ABOVE KNEE;  Surgeon: Marty Heck, MD;  Location: [facility];  Service: Vascular;  Laterality: Left;   AMPUTATION Right 02/07/2019   Procedure: AMPUTATION ABOVE KNEE;  Surgeon: Rosetta Posner, MD;  Location: [facility];  Service: Vascular;  Laterality: Right;   COLONOSCOPY  1992   Polyp removed   COLONOSCOPY  1998   normal   ORIF FEMUR FRACTURE Right 1929   PERIPHERAL VASCULAR INTERVENTION Left 06/12/2018   Procedure: PERIPHERAL VASCULAR INTERVENTION;  Surgeon: Marty Heck, MD;  Location: [facility];  Service: Cardiovascular;  Laterality: Left;  Attempted    TONSILLECTOMY      Allergies  Allergen Reactions   Tape Other (See Comments)    TAPE WILL TEAR THE SKIN!!!!    Allergies as of 03/30/2021       Reactions   Tape Other (See Comments)   TAPE WILL TEAR THE SKIN!!!!        Medication List        Accurate as of March 30, 2021 10:20 AM. If you have any questions, ask your nurse or doctor.          acetaminophen 650 MG CR tablet Commonly known as: TYLENOL Take 650 mg by mouth every 8 (eight) hours as needed for pain.   apixaban 2.5 MG Tabs tablet Commonly known as: ELIQUIS Take 2.5 mg by mouth 2 (two) times daily. Morning: 7am-11am Evening: 7:30pm-11pm   bisacodyl 10 MG suppository Commonly known as: DULCOLAX Place 10 mg rectally daily as needed for moderate constipation.   Calcium Carbonate-Vitamin D 600-400 MG-UNIT tablet Take 1 tablet by mouth daily. Morning: 7am-11am.   cholecalciferol 25 MCG (1000 UNIT) tablet Commonly known as: VITAMIN D Take 1 tablet by mouth daily.   gabapentin 100 MG capsule Commonly known as: NEURONTIN Take 100 mg by mouth at bedtime. 7:30pm-11pm.   metFORMIN 500 MG tablet Commonly known as:  GLUCOPHAGE Take 500 mg by mouth 2 (two) times daily with a meal. Morning: 7am-9am Evening: 5pm-7pm.   metoprolol tartrate 25 MG tablet Commonly known as: LOPRESSOR Take 12.5 mg by mouth 2 (two) times daily. Morning: 7am-11am Evening: 7pm-11pm   Propylene Glycol-Glycerin 1-0.3 % Soln Place 2 drops into both eyes daily as needed (dry eyes).   sennosides-docusate sodium 8.6-50 MG tablet Commonly known as: SENOKOT-S Take 2 tablets by mouth at bedtime as needed.   simvastatin 10 MG tablet Commonly known as: ZOCOR Take 10 mg by mouth daily. Between the hours of 5pm-7pm.   vitamin B-12 500 MCG tablet Commonly known as: CYANOCOBALAMIN Take 500 mcg by mouth daily.   zinc oxide 20 % ointment Apply 1 application topically as needed for irritation. To buttocks after every incontinent episode and as needed for redness. May keep at bedside. As Needed        Review of Systems  Constitutional:  Negative for activity change, appetite change, chills, fatigue and fever.  HENT:  Positive for dental problem. Negative for hearing loss and trouble  swallowing.   Eyes:  Negative for visual disturbance.  Respiratory:  Positive for cough. Negative for shortness of breath and wheezing.   Cardiovascular:  Negative for chest pain and leg swelling.  Gastrointestinal:  Negative for abdominal distention, abdominal pain, blood in stool, constipation, diarrhea, nausea and vomiting.  Genitourinary:  Negative for dysuria, frequency and hematuria.  Musculoskeletal:  Positive for gait problem and myalgias.       Bilateral AKA, phantom pain  Skin: Negative.   Neurological:  Positive for weakness. Negative for dizziness and headaches.  Psychiatric/Behavioral:  Negative for confusion, dysphoric mood and sleep disturbance. The patient is not nervous/anxious.    Immunization History  Administered Date(s) Administered   DTaP 12/31/2012   Influenza, High Dose Seasonal PF 04/04/2017, 04/10/2019    Influenza,inj,Quad PF,6+ Mos 03/28/2018   Influenza-Unspecified 04/10/2014, 03/25/2015, 04/06/2016, 03/30/2020   Moderna SARS-COV2 Booster Vaccination 05/10/2020, 12/01/2020   Moderna Sars-Covid-2 Vaccination 06/30/2019, 07/28/2019   PPD Test 04/29/2014   Pneumococcal Conjugate-13 04/28/2017   Pneumococcal-Unspecified 03/13/2011   Td 12/24/2012   Tdap 04/28/2017   Zoster Recombinat (Shingrix) 10/05/2017, 12/14/2017   Pertinent  Health Maintenance Due  Topic Date Due   FOOT EXAM  06/27/2018   URINE MICROALBUMIN  03/14/2019   OPHTHALMOLOGY EXAM  07/01/2020   INFLUENZA VACCINE  01/24/2021   HEMOGLOBIN A1C  02/07/2021   Fall Risk  01/07/2021 10/02/2018 09/11/2018 08/28/2018 08/08/2018  Falls in the past year? 1 0 0 0 0  Number falls in past yr: 1 0 0 0 -  Injury with Fall? 0 0 0 0 -  Risk for fall due to : History of fall(s);Impaired balance/gait;Impaired mobility - - - -  Follow up Falls evaluation completed;Education provided;Falls prevention discussed - - - -   Functional Status Survey:    Vitals:   03/30/21 1007  BP: (!) 84/53  Pulse: 88  Resp: 18  Temp: (!) 97.4 F (36.3 C)  SpO2: 94%  Weight: 108 lb (49 kg)  Height: 3\' 8"  (1.118 m)   Body mass index is 39.22 kg/m. Physical Exam Vitals reviewed.  Constitutional:      General: He is not in acute distress. HENT:     Head: Normocephalic.     Right Ear: There is no impacted cerumen.     Left Ear: There is no impacted cerumen.     Nose: Nose normal.     Mouth/Throat:     Mouth: Mucous membranes are moist.  Eyes:     General:        Right eye: No discharge.        Left eye: No discharge.  Neck:     Vascular: No carotid bruit.  Cardiovascular:     Rate and Rhythm: Normal rate. Rhythm irregular.     Pulses: Normal pulses.     Heart sounds: Murmur heard.  Pulmonary:     Effort: Pulmonary effort is normal. No respiratory distress.     Breath sounds: Normal breath sounds. No wheezing.  Abdominal:     General: Abdomen  is flat. Bowel sounds are normal. There is no distension.     Palpations: Abdomen is soft.     Tenderness: There is no abdominal tenderness.  Musculoskeletal:     Cervical back: Normal range of motion.     Comments: Bilateral AKA, left and right stump with no skin breakdown.   Lymphadenopathy:     Cervical: No cervical adenopathy.  Skin:    General: Skin is warm and dry.  Capillary Refill: Capillary refill takes less than 2 seconds.  Neurological:     General: No focal deficit present.     Mental Status: He is alert and oriented to person, place, and time.     Motor: Weakness present.     Gait: Gait abnormal.     Comments: Power wheelchair  Psychiatric:        Mood and Affect: Mood normal.        Behavior: Behavior normal.    Labs reviewed: Recent Labs    04/16/20 0000 08/10/20 0000  NA 136* 139  K 4.5 4.7  CL 97* 102  CO2 31* 31*  BUN 20 24*  CREATININE 1.2 1.2  CALCIUM 9.5 8.8   Recent Labs    04/16/20 0000 08/10/20 0000  AST 14 13*  ALT 12 12  ALKPHOS 65 55  ALBUMIN 3.6 3.1*   Recent Labs    04/16/20 0000 08/10/20 0000 11/04/20 0000  WBC 7.5 8.2 4.8  NEUTROABS 4,688.00 5,150.00 2,390.00  HGB 13.0* 11.5* 12.2*  HCT 39* 34* 37*  PLT 415* 360 320   Lab Results  Component Value Date   TSH 1.72 10/31/2019   Lab Results  Component Value Date   HGBA1C 7.6 08/10/2020   Lab Results  Component Value Date   CHOL 138 08/10/2020   HDL 43 08/10/2020   LDLCALC 78 08/10/2020   TRIG 83 08/10/2020   CHOLHDL 2.6 08/29/2018    Significant Diagnostic Results in last 30 days:  No results found.  Assessment/Plan 1. Paroxysmal atrial fibrillation (HCC) - rate controlled with metoprolol - cont eliquis for clot prevention - TSH  2. Controlled type 2 diabetes mellitus with stage 3 chronic kidney disease, without long-term current use of insulin (HCC) - blood sugars averaging 100-150's, no recent hypoglycemia - a1c 7.6 08/10/2020 - metformin 500 mg bid -  cmp - a1c  3. Hyperlipidemia LDL goal <70 - LDL 78 08/10/2020 - cont statin  4. S/P AKA (above knee amputation) bilateral ([place]) - denies increased pain - does well with power wheelchair  5. Neuropathic pain - cont gabapentin  6. Cough productive of clear sputum - ongoing, failed trial of mucinex - appear to be triggered when talking  7. COVID-19 vaccination refused - refused due to possible cardiac SE  8. Other specified hypotension - few recent blood pressures- may consider decreasing metoprolol in future - cbc/diff  Family/ staff Communication: plan discussed with patient and nurse  Labs/tests ordered:  cbc/diff, cmp, TSH, A1c

## 2021-04-01 ENCOUNTER — Encounter: Payer: Self-pay | Admitting: Orthopedic Surgery

## 2021-04-04 DIAGNOSIS — D649 Anemia, unspecified: Secondary | ICD-10-CM | POA: Diagnosis not present

## 2021-04-04 DIAGNOSIS — E119 Type 2 diabetes mellitus without complications: Secondary | ICD-10-CM | POA: Diagnosis not present

## 2021-04-04 DIAGNOSIS — E039 Hypothyroidism, unspecified: Secondary | ICD-10-CM | POA: Diagnosis not present

## 2021-04-04 LAB — CBC AND DIFFERENTIAL
HCT: 34 — AB (ref 41–53)
Hemoglobin: 11 — AB (ref 13.5–17.5)
Neutrophils Absolute: 6787
WBC: 9.6

## 2021-04-04 LAB — BASIC METABOLIC PANEL
BUN: 23 — AB (ref 4–21)
CO2: 31 — AB (ref 13–22)
Chloride: 99 (ref 99–108)
Creatinine: 1 (ref 0.6–1.3)
Glucose: 128
Potassium: 4.3 (ref 3.4–5.3)
Sodium: 137 (ref 137–147)

## 2021-04-04 LAB — COMPREHENSIVE METABOLIC PANEL
Albumin: 3 — AB (ref 3.5–5.0)
Calcium: 9 (ref 8.7–10.7)
Globulin: 2.5

## 2021-04-04 LAB — CBC: RBC: 3.39 — AB (ref 3.87–5.11)

## 2021-04-04 LAB — TSH: TSH: 1.72 (ref 0.41–5.90)

## 2021-04-04 LAB — HEPATIC FUNCTION PANEL
ALT: 9 — AB (ref 10–40)
AST: 11 — AB (ref 14–40)
Alkaline Phosphatase: 55 (ref 25–125)
Bilirubin, Direct: 0.6 — AB (ref 0.01–0.4)

## 2021-04-04 LAB — HEMOGLOBIN A1C: Hemoglobin A1C: 6.8

## 2021-04-06 DIAGNOSIS — Z85828 Personal history of other malignant neoplasm of skin: Secondary | ICD-10-CM | POA: Diagnosis not present

## 2021-04-06 DIAGNOSIS — L820 Inflamed seborrheic keratosis: Secondary | ICD-10-CM | POA: Diagnosis not present

## 2021-04-06 DIAGNOSIS — L720 Epidermal cyst: Secondary | ICD-10-CM | POA: Diagnosis not present

## 2021-04-06 DIAGNOSIS — L853 Xerosis cutis: Secondary | ICD-10-CM | POA: Diagnosis not present

## 2021-04-06 DIAGNOSIS — L814 Other melanin hyperpigmentation: Secondary | ICD-10-CM | POA: Diagnosis not present

## 2021-04-06 DIAGNOSIS — L570 Actinic keratosis: Secondary | ICD-10-CM | POA: Diagnosis not present

## 2021-04-06 DIAGNOSIS — L821 Other seborrheic keratosis: Secondary | ICD-10-CM | POA: Diagnosis not present

## 2021-04-28 DIAGNOSIS — L570 Actinic keratosis: Secondary | ICD-10-CM | POA: Diagnosis not present

## 2021-04-28 DIAGNOSIS — L821 Other seborrheic keratosis: Secondary | ICD-10-CM | POA: Diagnosis not present

## 2021-04-28 DIAGNOSIS — Z85828 Personal history of other malignant neoplasm of skin: Secondary | ICD-10-CM | POA: Diagnosis not present

## 2021-04-28 DIAGNOSIS — L814 Other melanin hyperpigmentation: Secondary | ICD-10-CM | POA: Diagnosis not present

## 2021-05-05 ENCOUNTER — Encounter: Payer: Self-pay | Admitting: Internal Medicine

## 2021-05-05 ENCOUNTER — Non-Acute Institutional Stay (SKILLED_NURSING_FACILITY): Payer: PPO | Admitting: Internal Medicine

## 2021-05-05 DIAGNOSIS — R058 Other specified cough: Secondary | ICD-10-CM | POA: Diagnosis not present

## 2021-05-05 DIAGNOSIS — Z89612 Acquired absence of left leg above knee: Secondary | ICD-10-CM | POA: Diagnosis not present

## 2021-05-05 DIAGNOSIS — E785 Hyperlipidemia, unspecified: Secondary | ICD-10-CM | POA: Diagnosis not present

## 2021-05-05 DIAGNOSIS — I480 Paroxysmal atrial fibrillation: Secondary | ICD-10-CM | POA: Diagnosis not present

## 2021-05-05 DIAGNOSIS — E871 Hypo-osmolality and hyponatremia: Secondary | ICD-10-CM | POA: Diagnosis not present

## 2021-05-05 DIAGNOSIS — N183 Chronic kidney disease, stage 3 unspecified: Secondary | ICD-10-CM

## 2021-05-05 DIAGNOSIS — M792 Neuralgia and neuritis, unspecified: Secondary | ICD-10-CM

## 2021-05-05 DIAGNOSIS — Z89611 Acquired absence of right leg above knee: Secondary | ICD-10-CM | POA: Diagnosis not present

## 2021-05-05 DIAGNOSIS — E1122 Type 2 diabetes mellitus with diabetic chronic kidney disease: Secondary | ICD-10-CM

## 2021-05-05 NOTE — Progress Notes (Signed)
Location:   [facility] Room Number: 21 Place of Service:  SNF [phone]) Provider:  Veleta Miners MD  Virgie Dad, MD  Patient Care Team: Virgie Dad, MD as PCP - General (Internal Medicine) Buford Dresser, MD as PCP - Cardiology (Cardiology) Ricard Dillon, MD as Consulting Physician (Internal Medicine) Virgie Dad, MD (Internal Medicine)  Extended Emergency Contact Information Primary Emergency Contact: [place] Address: [address]          [place], [place] [address] [name] of [place] Phone: [phone] Mobile Phone: [phone] Relation: Daughter Secondary Emergency Contact: [place] Mobile Phone: [phone] Relation: Daughter  Code Status:  DNR Managed Care Goals of care: Advanced Directive information Advanced Directives 05/05/2021  Does Patient Have a Medical Advance Directive? Yes  Type of Paramedic of [place];Living will;Out of facility DNR (pink MOST or yellow form)  Does patient want to make changes to medical advance directive? No - Patient declined  Copy of [place] in Chart? Yes - validated most recent copy scanned in chart (See row information)  Pre-existing out of facility DNR order (yellow form or pink MOST form) Yellow form placed in chart (order not valid for inpatient use)     Chief Complaint  Patient presents with   Medical Management of Chronic Issues   Quality Metric Gaps    PPSV23, eye exam, #5 Covid, hemoglobin A1C     HPI:  Pt is a 87 y.o. male seen today for medical management of chronic diseases.    Patient is Long term resident of facility  Patient has h/o Hypertension, Hyperlipidemia, Diabetes mellitus, h/o Brain stem Infarct s/p TPA  In 10/19, Hyponatremia, Atrial Fibrillation on Eliquis since 10/19.,PAD s/p Bilateral AKA  with first Left AKA and then Right AKA   Uses Power wheelchair  Cognitively doing well Weight  stable No Chest pain  Does have chronic productive cough sometimes with food but denies any discomfort with it.  Denies Fever or SOB  Past Medical History:  Diagnosis Date   BPH (benign prostatic hyperplasia) 10/14/2014   Cervicalgia 01/04/2016   Chronic kidney disease    Contusion of left shoulder 05/27/14   DM type 2 (diabetes mellitus, type 2) ([place])    Dysphagia    High blood pressure    History of basal cell cancer 11/21/2005   Right temple   History of colonic polyps 10/14/2003   Hyperlipidemia    Left knee pain 05/27/14   Peripheral neuropathic pain    Peripheral vascular disease ([place])    SAH (subarachnoid hemorrhage) ([place]) 2009   TBI   Stroke [facility])    TIA (transient ischemic attack) 11/06/2005   Left eye pain. Slurred speech. Diaphoresis. No residual effects.    Past Surgical History:  Procedure Laterality Date   ABDOMINAL AORTOGRAM W/LOWER EXTREMITY N/A 06/12/2018   Procedure: ABDOMINAL AORTOGRAM W/LOWER EXTREMITY;  Surgeon: Marty Heck, MD;  Location: [facility];  Service: Cardiovascular;  Laterality: N/A;   Achilles tendon repair Left 1998   ruptured tendon   AMPUTATION Left 10/11/2018   Procedure: Left  AMPUTATION ABOVE KNEE;  Surgeon: Marty Heck, MD;  Location: [facility];  Service: Vascular;  Laterality: Left;   AMPUTATION Right 02/07/2019   Procedure: AMPUTATION ABOVE KNEE;  Surgeon: Rosetta Posner, MD;  Location: [facility];  Service: Vascular;  Laterality: Right;   COLONOSCOPY  1992   Polyp removed   COLONOSCOPY  1998   normal  ORIF FEMUR FRACTURE Right 1929   PERIPHERAL VASCULAR INTERVENTION Left 06/12/2018   Procedure: PERIPHERAL VASCULAR INTERVENTION;  Surgeon: Marty Heck, MD;  Location: [facility];  Service: Cardiovascular;  Laterality: Left;  Attempted    TONSILLECTOMY      Allergies  Allergen Reactions   Tape Other (See Comments)    TAPE WILL TEAR THE SKIN!!!!    Allergies as of 05/05/2021       Reactions   Tape  Other (See Comments)   TAPE WILL TEAR THE SKIN!!!!        Medication List        Accurate as of May 05, 2021 11:16 AM. If you have any questions, ask your nurse or doctor.          acetaminophen 650 MG CR tablet Commonly known as: TYLENOL Take 650 mg by mouth every 8 (eight) hours as needed for pain.   apixaban 2.5 MG Tabs tablet Commonly known as: ELIQUIS Take 2.5 mg by mouth 2 (two) times daily. Morning: 7am-11am Evening: 7:30pm-11pm   bisacodyl 10 MG suppository Commonly known as: DULCOLAX Place 10 mg rectally daily as needed for moderate constipation.   Calcium Carbonate-Vitamin D 600-400 MG-UNIT tablet Take 1 tablet by mouth daily. Morning: 7am-11am.   cholecalciferol 25 MCG (1000 UNIT) tablet Commonly known as: VITAMIN D Take 1 tablet by mouth daily.   gabapentin 100 MG capsule Commonly known as: NEURONTIN Take 100 mg by mouth at bedtime. 7:30pm-11pm.   metFORMIN 500 MG tablet Commonly known as: GLUCOPHAGE Take 500 mg by mouth 2 (two) times daily with a meal. Morning: 7am-9am Evening: 5pm-7pm.   metoprolol tartrate 25 MG tablet Commonly known as: LOPRESSOR Take 12.5 mg by mouth 2 (two) times daily. Morning: 7am-11am Evening: 7pm-11pm   Propylene Glycol-Glycerin 1-0.3 % Soln Place 2 drops into both eyes daily as needed (dry eyes).   sennosides-docusate sodium 8.6-50 MG tablet Commonly known as: SENOKOT-S Take 2 tablets by mouth at bedtime as needed.   simvastatin 10 MG tablet Commonly known as: ZOCOR Take 10 mg by mouth daily. Between the hours of 5pm-7pm.   vitamin B-12 500 MCG tablet Commonly known as: CYANOCOBALAMIN Take 500 mcg by mouth daily.   zinc oxide 20 % ointment Apply 1 application topically as needed for irritation. To buttocks after every incontinent episode and as needed for redness. May keep at bedside. As Needed        Review of Systems Review of Systems  Constitutional: Negative for activity change, appetite  change, chills, diaphoresis, fatigue and fever.  HENT: Negative for mouth sores, postnasal drip, rhinorrhea, sinus pain and sore throat.   Respiratory: Negative for apnea, cough, chest tightness, shortness of breath and wheezing.   Cardiovascular: Negative for chest pain, palpitations and leg swelling.  Gastrointestinal: Negative for abdominal distention, abdominal pain, constipation, diarrhea, nausea and vomiting.  Genitourinary: Negative for dysuria and frequency.  Musculoskeletal: Negative for arthralgias, joint swelling and myalgias.  Skin: Negative for rash.  Neurological: Negative for dizziness, syncope, weakness, light-headedness and numbness.  Psychiatric/Behavioral: Negative for behavioral problems, confusion and sleep disturbance.    Immunization History  Administered Date(s) Administered   DTaP 12/31/2012   Influenza, High Dose Seasonal PF 04/04/2017, 04/10/2019   Influenza,inj,Quad PF,6+ Mos 03/28/2018   Influenza-Unspecified 04/10/2014, 03/25/2015, 04/06/2016, 03/30/2020, 04/20/2021   Moderna SARS-COV2 Booster Vaccination 05/10/2020, 12/01/2020   Moderna Sars-Covid-2 Vaccination 06/30/2019, 07/28/2019   PPD Test 04/29/2014   Pneumococcal Conjugate-13 04/28/2017   Pneumococcal-Unspecified 03/13/2011  Td 12/24/2012   Tdap 04/28/2017   Zoster Recombinat (Shingrix) 10/05/2017, 12/14/2017   Pertinent  Health Maintenance Due  Topic Date Due   OPHTHALMOLOGY EXAM  07/01/2020   HEMOGLOBIN A1C  08/31/2021   INFLUENZA VACCINE  Completed   FOOT EXAM  Discontinued   URINE MICROALBUMIN  Discontinued   Fall Risk 02/09/2019 02/09/2019 02/10/2019 10/31/2020 01/07/2021  Falls in the past year? - - - - 1  Was there an injury with Fall? - - - - 0  Fall Risk Category Calculator - - - - 2  Fall Risk Category - - - - Moderate  Patient Fall Risk Level High fall risk High fall risk High fall risk Moderate fall risk High fall risk  Patient at Risk for Falls Due to - - - - History of  fall(s);Impaired balance/gait;Impaired mobility  Fall risk Follow up - - - - Falls evaluation completed;Education provided;Falls prevention discussed   Functional Status Survey:    Vitals:   05/05/21 1101  BP: (!) 108/59  Pulse: 66  Resp: 18  Temp: (!) 97.2 F (36.2 C)  SpO2: 93%  Weight: 107 lb 9.6 oz (48.8 kg)  Height: 3\' 8"  (1.118 m)   Body mass index is 39.08 kg/m. Physical Exam Constitutional: Oriented to person, place, and time. Well-developed and well-nourished.  HENT:  Head: Normocephalic.  Mouth/Throat: Oropharynx is clear and moist.  Eyes: Pupils are equal, round, and reactive to light.  Neck: Neck supple.  Cardiovascular: Normal rate and normal heart sounds.  Murmur Present Pulmonary/Chest: Effort normal and breath sounds normal. No respiratory distress. No wheezes. She has no rales.  Abdominal: Soft. Bowel sounds are normal. No distension. There is no tenderness. There is no rebound.  Musculoskeletal: Bilateral AKA Lymphadenopathy: none Neurological: Alert and oriented to person, place, and time.  Skin: Skin is warm and dry.  Psychiatric: Normal mood and affect. Behavior is normal. Thought content normal.   Labs reviewed: Recent Labs    08/10/20 0000 03/03/21 0000  NA 139 136*  K 4.7 4.4  CL 102 100  CO2 31* 30*  BUN 24* 23*  CREATININE 1.2 1.0  CALCIUM 8.8 9.1   Recent Labs    08/10/20 0000 03/03/21 0000  AST 13* 15  ALT 12 9*  ALKPHOS 55 53  ALBUMIN 3.1* 3.3*   Recent Labs    08/10/20 0000 11/04/20 0000 03/03/21 0000  WBC 8.2 4.8 6.7  NEUTROABS 5,150.00 2,390.00 4,308.00  HGB 11.5* 12.2* 12.2*  HCT 34* 37* 36*  PLT 360 320 365   Lab Results  Component Value Date   TSH 1.72 10/31/2019   Lab Results  Component Value Date   HGBA1C 6.8 03/03/2021   Lab Results  Component Value Date   CHOL 130 03/03/2021   HDL 43 03/03/2021   LDLCALC 71 03/03/2021   TRIG 82 03/03/2021   CHOLHDL 2.6 08/29/2018    Significant Diagnostic  Results in last 30 days:  No results found.  Assessment/Plan Paroxysmal atrial fibrillation (HCC) Stable on Lopressor and Eliquis Controlled type 2 diabetes mellitus with stage 3 chronic kidney disease, without long-term current use of insulin (HCC) A1C good range Continue Metformin Hyperlipidemia LDL goal <70 On statin S/P AKA (above knee amputation) bilateral (HCC) Doing well  Neuropathic pain Stable on Neurontin Cough productive of clear sputum Due to Chronic Bronchitis Hyponatremia Sodium stable   Family/ staff Communication:   Labs/tests ordered:

## 2021-05-26 DIAGNOSIS — L821 Other seborrheic keratosis: Secondary | ICD-10-CM | POA: Diagnosis not present

## 2021-05-26 DIAGNOSIS — L814 Other melanin hyperpigmentation: Secondary | ICD-10-CM | POA: Diagnosis not present

## 2021-05-26 DIAGNOSIS — Z85828 Personal history of other malignant neoplasm of skin: Secondary | ICD-10-CM | POA: Diagnosis not present

## 2021-05-26 DIAGNOSIS — L570 Actinic keratosis: Secondary | ICD-10-CM | POA: Diagnosis not present

## 2021-06-03 ENCOUNTER — Non-Acute Institutional Stay (SKILLED_NURSING_FACILITY): Payer: PPO | Admitting: Orthopedic Surgery

## 2021-06-03 ENCOUNTER — Encounter: Payer: Self-pay | Admitting: Orthopedic Surgery

## 2021-06-03 DIAGNOSIS — M792 Neuralgia and neuritis, unspecified: Secondary | ICD-10-CM | POA: Diagnosis not present

## 2021-06-03 DIAGNOSIS — I480 Paroxysmal atrial fibrillation: Secondary | ICD-10-CM | POA: Diagnosis not present

## 2021-06-03 DIAGNOSIS — Z89611 Acquired absence of right leg above knee: Secondary | ICD-10-CM | POA: Diagnosis not present

## 2021-06-03 DIAGNOSIS — E1122 Type 2 diabetes mellitus with diabetic chronic kidney disease: Secondary | ICD-10-CM

## 2021-06-03 DIAGNOSIS — Z89612 Acquired absence of left leg above knee: Secondary | ICD-10-CM | POA: Diagnosis not present

## 2021-06-03 DIAGNOSIS — N1832 Chronic kidney disease, stage 3b: Secondary | ICD-10-CM | POA: Diagnosis not present

## 2021-06-03 DIAGNOSIS — R058 Other specified cough: Secondary | ICD-10-CM

## 2021-06-03 DIAGNOSIS — L570 Actinic keratosis: Secondary | ICD-10-CM | POA: Diagnosis not present

## 2021-06-03 DIAGNOSIS — L853 Xerosis cutis: Secondary | ICD-10-CM | POA: Diagnosis not present

## 2021-06-03 DIAGNOSIS — E785 Hyperlipidemia, unspecified: Secondary | ICD-10-CM | POA: Diagnosis not present

## 2021-06-03 DIAGNOSIS — N183 Chronic kidney disease, stage 3 unspecified: Secondary | ICD-10-CM

## 2021-06-03 NOTE — Progress Notes (Signed)
Location:   [facility] Room Number: 21 A Place of Service:  SNF (31) Provider:  Windell Moulding, NP  Virgie Dad, MD  Patient Care Team: Virgie Dad, MD as PCP - General (Internal Medicine) Buford Dresser, MD as PCP - Cardiology (Cardiology) Ricard Dillon, MD as Consulting Physician (Internal Medicine) Virgie Dad, MD (Internal Medicine)  Extended Emergency Contact Information Primary Emergency Contact: Gordon,[place] Address: [address]          [place], [place] [address] [name] of [place] Phone: [phone] Mobile Phone: [phone] Relation: Daughter Secondary Emergency Contact: [place] Mobile Phone: [phone] Relation: Daughter  Code Status:  DNR Goals of care: Advanced Directive information Advanced Directives 06/03/2021  Does Patient Have a Medical Advance Directive? Yes  Type of Paramedic of [place];Living will;Out of facility DNR (pink MOST or yellow form)  Does patient want to make changes to medical advance directive? No - Patient declined  Copy of [place] in Chart? Yes - validated most recent copy scanned in chart (See row information)  Pre-existing out of facility DNR order (yellow form or pink MOST form) Yellow form placed in chart (order not valid for inpatient use)     Chief Complaint  Patient presents with   Medical Management of Chronic Issues    Routine visit   Quality Metric Gaps    Pneumonia vaccine, Eye exam, COVID#3    HPI:  Pt is a 87 y.o. male seen today for medical management of chronic diseases.    He resides on the skilled nursing unit at [facility] due to chronic conditions and bilateral AKA. Past medical history includes: hypertension, PAD, atrial fibrillation, stroke, dysphagia, tyle II diabetes, Chronic kidney disease stage III, constipation, and neuropathic pain.  Afib- rate controlled with metoprolol, remains on eliquis  for clot prevention T2DM- a1c 6.8 04/04/2021 , metformin 500 mg bid, no recent hypoglycemia, blood sugars averaging 130's CKD stage 3- BUN/creat 23/1.0 04/04/2021 HLD- LDL 71 03/03/2021, remains on statin Bilateral AKA- ambulates with power wheelchair, transfers well from chair to toilet and bed, intermittent phantom pain, gabapentin qhs Neuropathic pain- see above Productive cough- continues to cough thick tan/clear sputum often,appears cough stimulated with talking today, unsuccessful trial of mucinex in past Dry skin- reports dry skin during winter months, no itching, reports putting lotion on skin on bath days  No recent falls or injuries. Ambulates with PWC.   Recent blood pressures:  12/09- 118/62  12/08- 106/58, 120/62  12/07- 113/63, 117/56   Recent weights:  12/02- 105.9 lbs  11/02- 107 lbs  10/10- 107 lbs   Past Medical History:  Diagnosis Date   BPH (benign prostatic hyperplasia) 10/14/2014   Cervicalgia 01/04/2016   Chronic kidney disease    Contusion of left shoulder 05/27/14   DM type 2 (diabetes mellitus, type 2) ([place])    Dysphagia    High blood pressure    History of basal cell cancer 11/21/2005   Right temple   History of colonic polyps 10/14/2003   Hyperlipidemia    Left knee pain 05/27/14   Peripheral neuropathic pain    Peripheral vascular disease ([place])    SAH (subarachnoid hemorrhage) ([place]) 2009   TBI   Stroke [facility])    TIA (transient ischemic attack) 11/06/2005   Left eye pain. Slurred speech. Diaphoresis. No residual effects.    Past Surgical History:  Procedure Laterality Date   ABDOMINAL AORTOGRAM W/LOWER EXTREMITY N/A 06/12/2018  Procedure: ABDOMINAL AORTOGRAM W/LOWER EXTREMITY;  Surgeon: Marty Heck, MD;  Location: [facility];  Service: Cardiovascular;  Laterality: N/A;   Achilles tendon repair Left 1998   ruptured tendon   AMPUTATION Left 10/11/2018   Procedure: Left  AMPUTATION ABOVE KNEE;  Surgeon: Marty Heck, MD;   Location: [facility];  Service: Vascular;  Laterality: Left;   AMPUTATION Right 02/07/2019   Procedure: AMPUTATION ABOVE KNEE;  Surgeon: Rosetta Posner, MD;  Location: [facility];  Service: Vascular;  Laterality: Right;   COLONOSCOPY  1992   Polyp removed   COLONOSCOPY  1998   normal   ORIF FEMUR FRACTURE Right 1929   PERIPHERAL VASCULAR INTERVENTION Left 06/12/2018   Procedure: PERIPHERAL VASCULAR INTERVENTION;  Surgeon: Marty Heck, MD;  Location: [facility];  Service: Cardiovascular;  Laterality: Left;  Attempted    TONSILLECTOMY      Allergies  Allergen Reactions   Tape Other (See Comments)    TAPE WILL TEAR THE SKIN!!!!    Allergies as of 06/03/2021       Reactions   Tape Other (See Comments)   TAPE WILL TEAR THE SKIN!!!!        Medication List        Accurate as of June 03, 2021  1:26 PM. If you have any questions, ask your nurse or doctor.          acetaminophen 650 MG CR tablet Commonly known as: TYLENOL Take 650 mg by mouth every 8 (eight) hours as needed for pain.   apixaban 2.5 MG Tabs tablet Commonly known as: ELIQUIS Take 2.5 mg by mouth 2 (two) times daily. Morning: 7am-11am Evening: 7:30pm-11pm   bisacodyl 10 MG suppository Commonly known as: DULCOLAX Place 10 mg rectally daily as needed for moderate constipation.   Calcium Carbonate-Vitamin D 600-400 MG-UNIT tablet Take 1 tablet by mouth daily. Morning: 7am-11am.   cholecalciferol 25 MCG (1000 UNIT) tablet Commonly known as: VITAMIN D Take 1 tablet by mouth daily.   gabapentin 100 MG capsule Commonly known as: NEURONTIN Take 100 mg by mouth at bedtime. 7:30pm-11pm.   metFORMIN 500 MG tablet Commonly known as: GLUCOPHAGE Take 500 mg by mouth 2 (two) times daily with a meal. Morning: 7am-9am Evening: 5pm-7pm.   metoprolol tartrate 25 MG tablet Commonly known as: LOPRESSOR Take 12.5 mg by mouth 2 (two) times daily. Morning: 7am-11am Evening: 7pm-11pm   Propylene  Glycol-Glycerin 1-0.3 % Soln Place 2 drops into both eyes daily as needed (dry eyes).   sennosides-docusate sodium 8.6-50 MG tablet Commonly known as: SENOKOT-S Take 2 tablets by mouth at bedtime as needed.   simvastatin 10 MG tablet Commonly known as: ZOCOR Take 10 mg by mouth daily. Between the hours of 5pm-7pm.   vitamin B-12 500 MCG tablet Commonly known as: CYANOCOBALAMIN Take 500 mcg by mouth daily.   zinc oxide 20 % ointment Apply 1 application topically as needed for irritation. To buttocks after every incontinent episode and as needed for redness. May keep at bedside. As Needed        Review of Systems  Constitutional:  Negative for activity change, appetite change, chills, fatigue and fever.  HENT:  Negative for hearing loss and trouble swallowing.   Eyes:  Negative for visual disturbance.  Respiratory:  Positive for cough. Negative for shortness of breath and wheezing.   Cardiovascular:  Negative for chest pain.  Gastrointestinal:  Negative for abdominal distention, abdominal pain, blood in stool, constipation, diarrhea,  nausea and vomiting.  Genitourinary:  Negative for dysuria, frequency and hematuria.  Musculoskeletal:  Positive for gait problem. Negative for arthralgias and joint swelling.  Skin:        Dry skin  Neurological:  Negative for dizziness, weakness and headaches.  Psychiatric/Behavioral:  Negative for confusion, dysphoric mood and sleep disturbance. The patient is not nervous/anxious.    Immunization History  Administered Date(s) Administered   DTaP 12/31/2012   Influenza, High Dose Seasonal PF 04/04/2017, 04/10/2019   Influenza,inj,Quad PF,6+ Mos 03/28/2018   Influenza-Unspecified 04/10/2014, 03/25/2015, 04/06/2016, 03/30/2020, 04/20/2021   Moderna SARS-COV2 Booster Vaccination 05/10/2020, 12/01/2020   Moderna Sars-Covid-2 Vaccination 06/30/2019, 07/28/2019   PPD Test 04/29/2014   Pneumococcal Conjugate-13 04/28/2017    Pneumococcal-Unspecified 09/25/2003, 03/13/2011   Td 12/24/2012   Tdap 04/28/2017   Zoster Recombinat (Shingrix) 10/05/2017, 12/14/2017   Pertinent  Health Maintenance Due  Topic Date Due   OPHTHALMOLOGY EXAM  07/01/2020   HEMOGLOBIN A1C  10/03/2021   INFLUENZA VACCINE  Completed   FOOT EXAM  Discontinued   URINE MICROALBUMIN  Discontinued   Fall Risk 02/09/2019 02/09/2019 02/10/2019 10/31/2020 01/07/2021  Falls in the past year? - - - - 1  Was there an injury with Fall? - - - - 0  Fall Risk Category Calculator - - - - 2  Fall Risk Category - - - - Moderate  Patient Fall Risk Level High fall risk High fall risk High fall risk Moderate fall risk High fall risk  Patient at Risk for Falls Due to - - - - History of fall(s);Impaired balance/gait;Impaired mobility  Fall risk Follow up - - - - Falls evaluation completed;Education provided;Falls prevention discussed   Functional Status Survey:    Vitals:   06/03/21 1308  BP: 118/62  Pulse: 71  Resp: 18  Temp: 97.7 F (36.5 C)  SpO2: 93%  Weight: 105 lb 14.4 oz (48 kg)  Height: 3\' 8"  (1.118 m)   Body mass index is 38.46 kg/m. Physical Exam Vitals reviewed.  Constitutional:      General: He is not in acute distress. HENT:     Head: Normocephalic.     Right Ear: There is no impacted cerumen.     Left Ear: There is no impacted cerumen.     Nose: Nose normal.     Mouth/Throat:     Mouth: Mucous membranes are moist.  Eyes:     General:        Right eye: No discharge.        Left eye: No discharge.  Neck:     Vascular: No carotid bruit.  Cardiovascular:     Rate and Rhythm: Normal rate. Rhythm irregular.     Pulses: Normal pulses.     Heart sounds: Murmur heard.  Pulmonary:     Effort: Pulmonary effort is normal. No respiratory distress.     Breath sounds: Normal breath sounds. No wheezing.  Abdominal:     General: Abdomen is flat. Bowel sounds are normal. There is no distension.     Palpations: Abdomen is soft.      Tenderness: There is no abdominal tenderness.  Musculoskeletal:     Cervical back: Normal range of motion.     Comments: Bilateral AKA  Lymphadenopathy:     Cervical: No cervical adenopathy.  Skin:    General: Skin is warm and dry.     Capillary Refill: Capillary refill takes less than 2 seconds.     Comments: 3 pea sized scabs to  forehead, CDI, no drainage, surrounding skin intact  Neurological:     General: No focal deficit present.     Mental Status: He is alert and oriented to person, place, and time.     Motor: Weakness present.     Gait: Gait abnormal.     Comments: PWC  Psychiatric:        Mood and Affect: Mood normal.        Behavior: Behavior normal.    Labs reviewed: Recent Labs    08/10/20 0000 03/03/21 0000 04/04/21 0000  NA 139 136* 137  K 4.7 4.4 4.3  CL 102 100 99  CO2 31* 30* 31*  BUN 24* 23* 23*  CREATININE 1.2 1.0 1.0  CALCIUM 8.8 9.1 9.0   Recent Labs    08/10/20 0000 03/03/21 0000 04/04/21 0000  AST 13* 15 11*  ALT 12 9* 9*  ALKPHOS 55 53 55  ALBUMIN 3.1* 3.3* 3.0*   Recent Labs    08/10/20 0000 11/04/20 0000 03/03/21 0000 04/04/21 0000  WBC 8.2 4.8 6.7 9.6  NEUTROABS 5,150.00 2,390.00 4,308.00 6,787.00  HGB 11.5* 12.2* 12.2* 11.0*  HCT 34* 37* 36* 34*  PLT 360 320 365  --    Lab Results  Component Value Date   TSH 1.72 04/04/2021   Lab Results  Component Value Date   HGBA1C 6.8 04/04/2021   Lab Results  Component Value Date   CHOL 130 03/03/2021   HDL 43 03/03/2021   LDLCALC 71 03/03/2021   TRIG 82 03/03/2021   CHOLHDL 2.6 08/29/2018    Significant Diagnostic Results in last 30 days:  No results found.  Assessment/Plan 1. Paroxysmal atrial fibrillation (HCC) - rate controlled with metoprolol - cont Eliquis for clot prevention  2. Controlled type 2 diabetes mellitus with stage 3 chronic kidney disease, without long-term current use of insulin (HCC) - A1c 6.8 04/04/2021 - no hypoglycemia - cont metformin   3.  Stage 3b chronic kidney disease ([place]) - BUN/creat 23/1.0 04/04/2021 - continue to avoid nephrotoxic drugs like NSAIDS, dose adjust medications to be renally excreted - encourage hydration  4. Hyperlipidemia LDL goal <70 - LDL 71 03/03/2021 - cont Lipitor  5. S/P AKA (above knee amputation) bilateral (HCC) - cont skilled nursing care - moves well with [place]  6. Neuropathic pain - intermittent, sharp pain - cont gabaoentin  7. Cough productive of clear sputum - unsuccessful trial of mucinex  8. Dry skin - extremities dry, denies itching - cont lotion to skin after bathing and prn  9. Actinic keratosis - recently seen by dermatology - 3 pea sized areas removed- no sign of infection/healing well     Family/ staff Communication: plan discussed with patient and nurse  Labs/tests ordered: none

## 2021-07-06 ENCOUNTER — Encounter: Payer: Self-pay | Admitting: Orthopedic Surgery

## 2021-07-06 ENCOUNTER — Non-Acute Institutional Stay (SKILLED_NURSING_FACILITY): Payer: PPO | Admitting: Orthopedic Surgery

## 2021-07-06 DIAGNOSIS — N183 Chronic kidney disease, stage 3 unspecified: Secondary | ICD-10-CM

## 2021-07-06 DIAGNOSIS — Z89611 Acquired absence of right leg above knee: Secondary | ICD-10-CM

## 2021-07-06 DIAGNOSIS — Z89612 Acquired absence of left leg above knee: Secondary | ICD-10-CM

## 2021-07-06 DIAGNOSIS — R058 Other specified cough: Secondary | ICD-10-CM

## 2021-07-06 DIAGNOSIS — L570 Actinic keratosis: Secondary | ICD-10-CM

## 2021-07-06 DIAGNOSIS — I480 Paroxysmal atrial fibrillation: Secondary | ICD-10-CM

## 2021-07-06 DIAGNOSIS — N1832 Chronic kidney disease, stage 3b: Secondary | ICD-10-CM

## 2021-07-06 DIAGNOSIS — E1122 Type 2 diabetes mellitus with diabetic chronic kidney disease: Secondary | ICD-10-CM

## 2021-07-06 DIAGNOSIS — M792 Neuralgia and neuritis, unspecified: Secondary | ICD-10-CM

## 2021-07-06 DIAGNOSIS — E785 Hyperlipidemia, unspecified: Secondary | ICD-10-CM

## 2021-07-06 NOTE — Progress Notes (Signed)
Location:   [facility] Room Number: 21 Place of Service:  SNF [id number]) Provider:  Windell Moulding, NP  Virgie Dad, MD  Patient Care Team: Virgie Dad, MD as PCP - General (Internal Medicine) Buford Dresser, MD as PCP - Cardiology (Cardiology) Ricard Dillon, MD as Consulting Physician (Internal Medicine) Virgie Dad, MD (Internal Medicine)  Extended Emergency Contact Information Primary Emergency Contact: Gordon,[place] Address: [address]          [place], [place] [address] [name] of [place] Phone: [phone] Mobile Phone: [phone] Relation: Daughter Secondary Emergency Contact: Kermit Mobile Phone: [phone] Relation: Daughter  Code Status:  DNR Goals of care: Advanced Directive information Advanced Directives 07/06/2021  Does Patient Have a Medical Advance Directive? Yes  Type of Paramedic of [place];Living will;Out of facility DNR (pink MOST or yellow form)  Does patient want to make changes to medical advance directive? No - Patient declined  Copy of [place] in Chart? Yes - validated most recent copy scanned in chart (See row information)  Pre-existing out of facility DNR order (yellow form or pink MOST form) Yellow form placed in chart (order not valid for inpatient use)     Chief Complaint  Patient presents with   Medical Management of Chronic Issues    Routine follow up visit.   Health Maintenance    Pneumonia vaccine,eye exam, COVID booster    HPI:  Pt is a 88 y.o. male seen today for medical management of chronic diseases.    He resides on the skilled nursing unit at [facility] due to chronic conditions and bilateral AKA. Past medical history includes: hypertension, PAD, atrial fibrillation, stroke, dysphagia, tyle II diabetes, Chronic kidney disease stage III, constipation, and neuropathic pain.   T2DM- a1c 6.8 04/04/2021 , metformin 500 mg  bid, no recent hypoglycemia, blood sugars averaging 120-140's, reports eye exam last year Afib- rate controlled with metoprolol, remains on eliquis for clot prevention CKD stage 3- BUN/creat 23/1.0 04/04/2021 HLD- LDL 71 03/03/2021, remains on statin Bilateral AKA- ambulates with power wheelchair, transfers well from chair to toilet and bed, intermittent phantom pain, gabapentin qhs Neuropathic pain- see above Productive cough- continues to cough thick tan/clear sputum often, reports increased amount when he wakes up in the morning, unsuccessful trial of mucinex in past Actinic Keratosis- followed by dermatology 05/26/2021, a few lesions noted on forehead- treated with liquid liquid nitrogen, areas have healed at this time  Very pleasant and easy to converse with. He is often seen around the unit in his power wheelchair. No recent falls or injuries.   Recent blood pressures:  01/11- 107/60  01/10- 110/61, 112/60  01/09- 106/60  Recent weights:  01/04- 107.1 lbs  12/02- 105.9 lbs  11/06- 107.1 lbs  Past Medical History:  Diagnosis Date   BPH (benign prostatic hyperplasia) 10/14/2014   Cervicalgia 01/04/2016   Chronic kidney disease    Contusion of left shoulder 05/27/14   DM type 2 (diabetes mellitus, type 2) ([place])    Dysphagia    High blood pressure    History of basal cell cancer 11/21/2005   Right temple   History of colonic polyps 10/14/2003   Hyperlipidemia    Left knee pain 05/27/14   Peripheral neuropathic pain    Peripheral vascular disease ([place])    SAH (subarachnoid hemorrhage) ([place]) 2009   TBI   Stroke [facility])    TIA (transient ischemic attack) 11/06/2005  Left eye pain. Slurred speech. Diaphoresis. No residual effects.    Past Surgical History:  Procedure Laterality Date   ABDOMINAL AORTOGRAM W/LOWER EXTREMITY N/A 06/12/2018   Procedure: ABDOMINAL AORTOGRAM W/LOWER EXTREMITY;  Surgeon: Marty Heck, MD;  Location: [facility] CV LAB;  Service: Cardiovascular;   Laterality: N/A;   Achilles tendon repair Left 1998   ruptured tendon   AMPUTATION Left 10/11/2018   Procedure: Left  AMPUTATION ABOVE KNEE;  Surgeon: Marty Heck, MD;  Location: [facility];  Service: Vascular;  Laterality: Left;   AMPUTATION Right 02/07/2019   Procedure: AMPUTATION ABOVE KNEE;  Surgeon: Rosetta Posner, MD;  Location: [facility];  Service: Vascular;  Laterality: Right;   COLONOSCOPY  1992   Polyp removed   COLONOSCOPY  1998   normal   ORIF FEMUR FRACTURE Right 1929   PERIPHERAL VASCULAR INTERVENTION Left 06/12/2018   Procedure: PERIPHERAL VASCULAR INTERVENTION;  Surgeon: Marty Heck, MD;  Location: [place] CV LAB;  Service: Cardiovascular;  Laterality: Left;  Attempted    TONSILLECTOMY      Allergies  Allergen Reactions   Tape Other (See Comments)    TAPE WILL TEAR THE SKIN!!!!    Allergies as of 07/06/2021       Reactions   Tape Other (See Comments)   TAPE WILL TEAR THE SKIN!!!!        Medication List        Accurate as of July 06, 2021  3:20 PM. If you have any questions, ask your nurse or doctor.          acetaminophen 650 MG CR tablet Commonly known as: TYLENOL Take 650 mg by mouth every 8 (eight) hours as needed for pain.   apixaban 2.5 MG Tabs tablet Commonly known as: ELIQUIS Take 2.5 mg by mouth 2 (two) times daily. Morning: 7am-11am Evening: 7:30pm-11pm   bisacodyl 10 MG suppository Commonly known as: DULCOLAX Place 10 mg rectally daily as needed for moderate constipation.   Calcium Carbonate-Vitamin D 600-400 MG-UNIT tablet Take 1 tablet by mouth daily. Morning: 7am-11am.   cholecalciferol 25 MCG (1000 UNIT) tablet Commonly known as: VITAMIN D Take 1 tablet by mouth daily.   gabapentin 100 MG capsule Commonly known as: NEURONTIN Take 100 mg by mouth at bedtime. 7:30pm-11pm.   metFORMIN 500 MG tablet Commonly known as: GLUCOPHAGE Take 500 mg by mouth 2 (two) times daily with a meal. Morning: 7am-9am Evening:  5pm-7pm.   metoprolol tartrate 25 MG tablet Commonly known as: LOPRESSOR Take 12.5 mg by mouth 2 (two) times daily. Morning: 7am-11am Evening: 7pm-11pm   Propylene Glycol-Glycerin 1-0.3 % Soln Place 2 drops into both eyes daily as needed (dry eyes).   sennosides-docusate sodium 8.6-50 MG tablet Commonly known as: SENOKOT-S Take 2 tablets by mouth at bedtime as needed.   simvastatin 10 MG tablet Commonly known as: ZOCOR Take 10 mg by mouth daily. Between the hours of 5pm-7pm.   vitamin B-12 500 MCG tablet Commonly known as: CYANOCOBALAMIN Take 500 mcg by mouth daily.   zinc oxide 20 % ointment Apply 1 application topically as needed for irritation. To buttocks after every incontinent episode and as needed for redness. May keep at bedside. As Needed        Review of Systems  Constitutional:  Negative for activity change, appetite change, chills and fatigue.  HENT:  Negative for congestion and trouble swallowing.   Eyes:  Negative for visual disturbance.  Respiratory:  Positive for cough. Negative for  shortness of breath and wheezing.   Cardiovascular:  Negative for chest pain.  Gastrointestinal:  Negative for abdominal distention, abdominal pain, constipation, diarrhea and nausea.  Genitourinary:  Negative for dysuria, frequency and hematuria.  Musculoskeletal:  Positive for gait problem.  Skin:  Negative for wound.  Neurological:  Negative for dizziness, weakness and light-headedness.  Psychiatric/Behavioral:  Negative for confusion, dysphoric mood and sleep disturbance. The patient is not nervous/anxious.    Immunization History  Administered Date(s) Administered   DTaP 12/31/2012   Influenza, High Dose Seasonal PF 04/04/2017, 04/10/2019   Influenza,inj,Quad PF,6+ Mos 03/28/2018   Influenza-Unspecified 04/10/2014, 03/25/2015, 04/06/2016, 03/30/2020, 04/20/2021   Moderna SARS-COV2 Booster Vaccination 05/10/2020, 12/01/2020   Moderna Sars-Covid-2 Vaccination  06/30/2019, 07/28/2019   PPD Test 04/29/2014   Pneumococcal Conjugate-13 04/28/2017   Pneumococcal-Unspecified 09/25/2003, 03/13/2011   Td 12/24/2012   Tdap 04/28/2017   Zoster Recombinat (Shingrix) 10/05/2017, 12/14/2017   Pertinent  Health Maintenance Due  Topic Date Due   OPHTHALMOLOGY EXAM  07/01/2020   HEMOGLOBIN A1C  10/03/2021   INFLUENZA VACCINE  Completed   FOOT EXAM  Discontinued   URINE MICROALBUMIN  Discontinued   Fall Risk 02/09/2019 02/09/2019 02/10/2019 10/31/2020 01/07/2021  Falls in the past year? - - - - 1  Was there an injury with Fall? - - - - 0  Fall Risk Category Calculator - - - - 2  Fall Risk Category - - - - Moderate  Patient Fall Risk Level High fall risk High fall risk High fall risk Moderate fall risk High fall risk  Patient at Risk for Falls Due to - - - - History of fall(s);Impaired balance/gait;Impaired mobility  Fall risk Follow up - - - - Falls evaluation completed;Education provided;Falls prevention discussed   Functional Status Survey:    There were no vitals filed for this visit. There is no height or weight on file to calculate BMI. Physical Exam Vitals reviewed.  Constitutional:      General: He is not in acute distress.    Comments: Hoarse voice  HENT:     Head: Normocephalic.     Right Ear: There is no impacted cerumen.     Left Ear: There is no impacted cerumen.     Nose: Nose normal.     Mouth/Throat:     Mouth: Mucous membranes are moist.  Eyes:     General:        Right eye: No discharge.        Left eye: No discharge.  Neck:     Vascular: No carotid bruit.  Cardiovascular:     Rate and Rhythm: Normal rate. Rhythm irregular.     Pulses: Normal pulses.     Heart sounds: Murmur heard.  Pulmonary:     Effort: Pulmonary effort is normal. No respiratory distress.     Breath sounds: Normal breath sounds. No wheezing.  Abdominal:     General: Bowel sounds are normal. There is no distension.     Palpations: Abdomen is soft.      Tenderness: There is no abdominal tenderness.  Musculoskeletal:     Cervical back: Normal range of motion.     Comments: Bilateral AKA  Lymphadenopathy:     Cervical: No cervical adenopathy.  Skin:    General: Skin is warm and dry.     Capillary Refill: Capillary refill takes less than 2 seconds.     Comments: No open wounds to forehead  Neurological:     General: No focal deficit  present.     Mental Status: He is alert and oriented to person, place, and time.     Motor: Weakness present.     Gait: Gait abnormal.     Comments: Power wheelchair  Psychiatric:        Mood and Affect: Mood normal.        Behavior: Behavior normal.    Labs reviewed: Recent Labs    08/10/20 0000 03/03/21 0000 04/04/21 0000  NA 139 136* 137  K 4.7 4.4 4.3  CL 102 100 99  CO2 31* 30* 31*  BUN 24* 23* 23*  CREATININE 1.2 1.0 1.0  CALCIUM 8.8 9.1 9.0   Recent Labs    08/10/20 0000 03/03/21 0000 04/04/21 0000  AST 13* 15 11*  ALT 12 9* 9*  ALKPHOS 55 53 55  ALBUMIN 3.1* 3.3* 3.0*   Recent Labs    08/10/20 0000 11/04/20 0000 03/03/21 0000 04/04/21 0000  WBC 8.2 4.8 6.7 9.6  NEUTROABS 5,150.00 2,390.00 4,308.00 6,787.00  HGB 11.5* 12.2* 12.2* 11.0*  HCT 34* 37* 36* 34*  PLT 360 320 365  --    Lab Results  Component Value Date   TSH 1.72 04/04/2021   Lab Results  Component Value Date   HGBA1C 6.8 04/04/2021   Lab Results  Component Value Date   CHOL 130 03/03/2021   HDL 43 03/03/2021   LDLCALC 71 03/03/2021   TRIG 82 03/03/2021   CHOLHDL 2.6 08/29/2018    Significant Diagnostic Results in last 30 days:  No results found.  Assessment/Plan 1. Controlled type 2 diabetes mellitus with stage 3 chronic kidney disease, without long-term current use of insulin ([place]) - a1c 6.8 04/04/2021 - no recent hypoglycemia, sugars 120-140's - cont metformin - reports eye exam in 2022- cannot find documentation - diabetic eye exam- future  2. Paroxysmal atrial fibrillation (HCC) -  rate controlled with metoprolol - cont Eliquis for clot prevention  3. Stage 3b chronic kidney disease ([place]) - cont to avoid nephrotoxic drugs like NSAIDS and dose adjust medications to be renally excreted - encourage hydration with water  4. Hyperlipidemia LDL goal <70 - cont statin  5. S/P AKA (above knee amputation) bilateral (HCC) - some phantom pain at times - transfers well - cont gabapentin  6. Neuropathic pain - see above  7. Cough productive of clear sputum - ongoing, increased amount in AM - failed trial of mucinex  8. Actinic keratosis - a few lesions on forehead removed 05/26/2021 - areas have healed     Family/ staff Communication: plan discussed with patient and nurse  Labs/tests ordered:  none

## 2021-07-07 ENCOUNTER — Non-Acute Institutional Stay (SKILLED_NURSING_FACILITY): Payer: PPO | Admitting: Internal Medicine

## 2021-07-07 ENCOUNTER — Encounter: Payer: Self-pay | Admitting: Internal Medicine

## 2021-07-07 DIAGNOSIS — M792 Neuralgia and neuritis, unspecified: Secondary | ICD-10-CM

## 2021-07-07 DIAGNOSIS — N183 Chronic kidney disease, stage 3 unspecified: Secondary | ICD-10-CM

## 2021-07-07 DIAGNOSIS — E1122 Type 2 diabetes mellitus with diabetic chronic kidney disease: Secondary | ICD-10-CM | POA: Diagnosis not present

## 2021-07-07 DIAGNOSIS — Z89612 Acquired absence of left leg above knee: Secondary | ICD-10-CM

## 2021-07-07 DIAGNOSIS — Z89611 Acquired absence of right leg above knee: Secondary | ICD-10-CM | POA: Diagnosis not present

## 2021-07-07 DIAGNOSIS — I480 Paroxysmal atrial fibrillation: Secondary | ICD-10-CM

## 2021-07-07 NOTE — Progress Notes (Signed)
Location: [facility] Room Number: 21 Place of Service:  SNF [phone])  Provider: Veleta Miners MD  Code Status: DNR Managed Care Goals of Care:  Advanced Directives 07/07/2021  Does Patient Have a Medical Advance Directive? Yes  Type of Paramedic of [place];Living will;Out of facility DNR (pink MOST or yellow form)  Does patient want to make changes to medical advance directive? No - Patient declined  Copy of [place] in Chart? Yes - validated most recent copy scanned in chart (See row information)  Pre-existing out of facility DNR order (yellow form or pink MOST form) Yellow form placed in chart (order not valid for inpatient use)     Chief Complaint  Patient presents with   Acute Visit    Phantom Pains Acute     HPI: Patient is a 88 y.o. male seen today for an acute visit for Phantom Pain  Patient has h/o Hypertension, Hyperlipidemia, Diabetes mellitus, h/o Brain stem Infarct s/p TPA  In 10/19, Hyponatremia, Atrial Fibrillation on Eliquis since 10/19.,PAD s/p Bilateral AKA  with first Left AKA and then Right AKA    Uses Power wheelchair  Does have chronic productive cough sometimes with food but denies any discomfort with it  Patient was seen for Acute onset of Phantom pains in his Legs Especially Right leg He says he has been feeling it but noticed worsening today Says he feels he has legs and it feels very uncomfortable No Other issue  Past Medical History:  Diagnosis Date   BPH (benign prostatic hyperplasia) 10/14/2014   Cervicalgia 01/04/2016   Chronic kidney disease    Contusion of left shoulder 05/27/14   DM type 2 (diabetes mellitus, type 2) ([place])    Dysphagia    High blood pressure    History of basal cell cancer 11/21/2005   Right temple   History of colonic polyps 10/14/2003   Hyperlipidemia    Left knee pain 05/27/14   Peripheral neuropathic pain    Peripheral vascular disease ([place])    SAH  (subarachnoid hemorrhage) ([place]) 2009   TBI   Stroke [facility])    TIA (transient ischemic attack) 11/06/2005   Left eye pain. Slurred speech. Diaphoresis. No residual effects.     Past Surgical History:  Procedure Laterality Date   ABDOMINAL AORTOGRAM W/LOWER EXTREMITY N/A 06/12/2018   Procedure: ABDOMINAL AORTOGRAM W/LOWER EXTREMITY;  Surgeon: Marty Heck, MD;  Location: [place] [facility];  Service: Cardiovascular;  Laterality: N/A;   Achilles tendon repair Left 1998   ruptured tendon   AMPUTATION Left 10/11/2018   Procedure: Left  AMPUTATION ABOVE KNEE;  Surgeon: Marty Heck, MD;  Location: [facility];  Service: Vascular;  Laterality: Left;   AMPUTATION Right 02/07/2019   Procedure: AMPUTATION ABOVE KNEE;  Surgeon: Rosetta Posner, MD;  Location: [place] [facility];  Service: Vascular;  Laterality: Right;   COLONOSCOPY  1992   Polyp removed   COLONOSCOPY  1998   normal   ORIF FEMUR FRACTURE Right 1929   PERIPHERAL VASCULAR INTERVENTION Left 06/12/2018   Procedure: PERIPHERAL VASCULAR INTERVENTION;  Surgeon: Marty Heck, MD;  Location: [facility];  Service: Cardiovascular;  Laterality: Left;  Attempted    TONSILLECTOMY      Allergies  Allergen Reactions   Tape Other (See Comments)    TAPE WILL TEAR THE SKIN!!!!    Outpatient Encounter Medications as of 07/07/2021  Medication Sig   acetaminophen (TYLENOL) 650 MG  CR tablet Take 650 mg by mouth every 8 (eight) hours as needed for pain.   apixaban (ELIQUIS) 2.5 MG TABS tablet Take 2.5 mg by mouth 2 (two) times daily. Morning: 7am-11am Evening: 7:30pm-11pm   bisacodyl (DULCOLAX) 10 MG suppository Place 10 mg rectally daily as needed for moderate constipation.   Calcium Carbonate-Vitamin D 600-400 MG-UNIT tablet Take 1 tablet by mouth daily. Morning: 7am-11am.   cholecalciferol (VITAMIN D) 25 MCG (1000 UNIT) tablet Take 1 tablet by mouth daily.   gabapentin (NEURONTIN) 100 MG capsule Take 100 mg by mouth 2 (two) times  daily. 7:30pm-11pm.   metFORMIN (GLUCOPHAGE) 500 MG tablet Take 500 mg by mouth 2 (two) times daily with a meal. Morning: 7am-9am Evening: 5pm-7pm.   metoprolol tartrate (LOPRESSOR) 25 MG tablet Take 12.5 mg by mouth 2 (two) times daily. Morning: 7am-11am Evening: 7pm-11pm   Propylene Glycol-Glycerin 1-0.3 % SOLN Place 2 drops into both eyes daily as needed (dry eyes).    sennosides-docusate sodium (SENOKOT-S) 8.6-50 MG tablet Take 2 tablets by mouth at bedtime as needed.    simvastatin (ZOCOR) 10 MG tablet Take 10 mg by mouth daily. Between the hours of 5pm-7pm.   vitamin B-12 (CYANOCOBALAMIN) 500 MCG tablet Take 500 mcg by mouth daily.   zinc oxide 20 % ointment Apply 1 application topically as needed for irritation. To buttocks after every incontinent episode and as needed for redness. May keep at bedside. As Needed   No facility-administered encounter medications on file as of 07/07/2021.    Review of Systems:  Review of Systems  Constitutional:  Negative for activity change, appetite change and unexpected weight change.  HENT: Negative.    Respiratory:  Negative for cough and shortness of breath.   Cardiovascular:  Negative for leg swelling.  Gastrointestinal:  Negative for constipation.  Genitourinary:  Negative for frequency.  Musculoskeletal:  Negative for arthralgias, gait problem and myalgias.  Skin: Negative.  Negative for rash.  Neurological:  Negative for dizziness and weakness.  Psychiatric/Behavioral:  Negative for confusion and sleep disturbance.   All other systems reviewed and are negative.  Health Maintenance  Topic Date Due   Pneumonia Vaccine 50+ Years old (2 - PPSV23 if available, else PCV20) 04/28/2018   OPHTHALMOLOGY EXAM  07/01/2020   HEMOGLOBIN A1C  10/03/2021   TETANUS/TDAP  04/29/2027   INFLUENZA VACCINE  Completed   Zoster Vaccines- Shingrix  Completed   HPV VACCINES  Aged Out   FOOT EXAM  Discontinued   URINE MICROALBUMIN  Discontinued   COVID-19  Vaccine  Discontinued    Physical Exam: Vitals:   07/07/21 1502  BP: 122/63  Pulse: 70  Temp: (!) 97.4 F (36.3 C)  SpO2: 96%  Weight: 107 lb 1.6 oz (48.6 kg)  Height: 3\' 8"  (1.118 m)   Body mass index is 38.89 kg/m. Physical Exam Vitals reviewed.  Constitutional:      Appearance: Normal appearance.  HENT:     Head: Normocephalic.     Mouth/Throat:     Mouth: Mucous membranes are moist.     Pharynx: Oropharynx is clear.  Eyes:     Pupils: Pupils are equal, round, and reactive to light.  Cardiovascular:     Rate and Rhythm: Normal rate and regular rhythm.     Pulses: Normal pulses.     Heart sounds: Murmur heard.  Pulmonary:     Effort: Pulmonary effort is normal. No respiratory distress.     Breath sounds: Normal breath sounds. No rales.  Abdominal:     General: Abdomen is flat. Bowel sounds are normal.     Palpations: Abdomen is soft.  Musculoskeletal:        General: No swelling.     Cervical back: Neck supple.     Comments: Bilateral AKA  Skin intact no issues noticed  Skin:    General: Skin is warm.  Neurological:     General: No focal deficit present.     Mental Status: He is alert and oriented to person, place, and time.  Psychiatric:        Mood and Affect: Mood normal.        Thought Content: Thought content normal.    Labs reviewed: Basic Metabolic Panel: Recent Labs    08/10/20 0000 03/03/21 0000 04/04/21 0000  NA 139 136* 137  K 4.7 4.4 4.3  CL 102 100 99  CO2 31* 30* 31*  BUN 24* 23* 23*  CREATININE 1.2 1.0 1.0  CALCIUM 8.8 9.1 9.0  TSH  --   --  1.72   Liver Function Tests: Recent Labs    08/10/20 0000 03/03/21 0000 04/04/21 0000  AST 13* 15 11*  ALT 12 9* 9*  ALKPHOS 55 53 55  ALBUMIN 3.1* 3.3* 3.0*   No results for input(s): LIPASE, AMYLASE in the last 8760 hours. No results for input(s): AMMONIA in the last 8760 hours. CBC: Recent Labs    08/10/20 0000 11/04/20 0000 03/03/21 0000 04/04/21 0000  WBC 8.2 4.8 6.7 9.6   NEUTROABS 5,150.00 2,390.00 4,308.00 6,787.00  HGB 11.5* 12.2* 12.2* 11.0*  HCT 34* 37* 36* 34*  PLT 360 320 365  --    Lipid Panel: Recent Labs    08/10/20 0000 03/03/21 0000  CHOL 138 130  HDL 43 43  LDLCALC 78 71  TRIG 83 82   Lab Results  Component Value Date   HGBA1C 6.8 04/04/2021    Procedures since last visit: No results found.  Assessment/Plan Neuropathic pain Will Change his Neurontin to 100 mg BID If pain continues can consider increasing the dose  S/P AKA (above knee amputation) bilateral (HCC) Uses Power chair  Other issues Paroxysmal atrial fibrillation (HCC) Stable on Lopressor and Eliquis Controlled type 2 diabetes mellitus with stage 3 chronic kidney disease, without long-term current use of insulin (HCC) A1C good range Continue Metformin Hyperlipidemia LDL goal <70 On statin  Cough productive of clear sputum Due to Chronic Bronchitis Hyponatremia Sodium stable   Labs/tests ordered:  * No order type specified * Next appt:  Visit date not found

## 2021-08-05 ENCOUNTER — Encounter: Payer: Self-pay | Admitting: Orthopedic Surgery

## 2021-08-05 ENCOUNTER — Non-Acute Institutional Stay (SKILLED_NURSING_FACILITY): Payer: PPO | Admitting: Orthopedic Surgery

## 2021-08-05 DIAGNOSIS — I480 Paroxysmal atrial fibrillation: Secondary | ICD-10-CM | POA: Diagnosis not present

## 2021-08-05 DIAGNOSIS — Z23 Encounter for immunization: Secondary | ICD-10-CM

## 2021-08-05 DIAGNOSIS — Z66 Do not resuscitate: Secondary | ICD-10-CM

## 2021-08-05 DIAGNOSIS — Z89611 Acquired absence of right leg above knee: Secondary | ICD-10-CM

## 2021-08-05 DIAGNOSIS — R5383 Other fatigue: Secondary | ICD-10-CM

## 2021-08-05 DIAGNOSIS — M792 Neuralgia and neuritis, unspecified: Secondary | ICD-10-CM

## 2021-08-05 DIAGNOSIS — N183 Chronic kidney disease, stage 3 unspecified: Secondary | ICD-10-CM

## 2021-08-05 DIAGNOSIS — Z89612 Acquired absence of left leg above knee: Secondary | ICD-10-CM

## 2021-08-05 DIAGNOSIS — N1832 Chronic kidney disease, stage 3b: Secondary | ICD-10-CM

## 2021-08-05 DIAGNOSIS — E1122 Type 2 diabetes mellitus with diabetic chronic kidney disease: Secondary | ICD-10-CM

## 2021-08-05 DIAGNOSIS — R058 Other specified cough: Secondary | ICD-10-CM

## 2021-08-05 DIAGNOSIS — E785 Hyperlipidemia, unspecified: Secondary | ICD-10-CM

## 2021-08-05 NOTE — Progress Notes (Signed)
Location:  [facility] Room Number: [place] of Service:  SNF [phone]) Provider:  Windell Moulding, AGNP-C  Virgie Dad, MD  Patient Care Team: Virgie Dad, MD as PCP - General (Internal Medicine) Buford Dresser, MD as PCP - Cardiology (Cardiology) Ricard Dillon, MD as Consulting Physician (Internal Medicine) Virgie Dad, MD (Internal Medicine)  Extended Emergency Contact Information Primary Emergency Contact: Gordon,[place] Address: [address]          [place], [place] [address] [name] of [place] Phone: [phone] Mobile Phone: [phone] Relation: Daughter Secondary Emergency Contact: [place] Mobile Phone: [phone] Relation: Daughter  Code Status:  DNR Goals of care: Advanced Directive information Advanced Directives 07/07/2021  Does Patient Have a Medical Advance Directive? Yes  Type of Paramedic of [place];Living will;Out of facility DNR (pink MOST or yellow form)  Does patient want to make changes to medical advance directive? No - Patient declined  Copy of [place] in Chart? Yes - validated most recent copy scanned in chart (See row information)  Pre-existing out of facility DNR order (yellow form or pink MOST form) Yellow form placed in chart (order not valid for inpatient use)     Chief Complaint  Patient presents with   Medical Management of Chronic Issues    HPI:  Pt is a 88 y.o. male seen today for medical management of chronic diseases.    He currently resides on the skilled nursing unit at [facility]. PMH: hypertension, HLD, PAD s/p bilateral AKA- left first then right, atrial fibrillation, stroke, dysphagia, T2DM, CKD III, constipation, and neuropathic pain.  T2DM- a1c 6.8 04/04/2021, metformin 500 mg bid, no recent hypoglycemia, blood sugars averaging 100-160's,  Fatigue- TSH 1.72 04/04/2021, hgb 11.0 04/04/2021, he reports napping more  during the day, thinks it may be related to weather Afib- HR 60-70's controlled with metoprolol, remains on eliquis for clot prevention CKD stage 3- BUN/creat 23/1.0 04/04/2021 HLD- LDL 71 03/03/2021, remains on statin Bilateral AKA- ambulates with power wheelchair, transfers well from chair to toilet and bed, intermittent phantom pain, remains on gabapentin Neuropathic pain- R>L, gabapentin increased last month, reports improved pain but he continues to have pain to right extremity in evening- resolves by bedtime, he does not want to increase gabapentin at this time Productive cough- no changes to cough, continues to cough thick tan/clear sputum often, unsuccessful trial of mucinex in past  No recent falls or injuries. Ambulates with PWC.   Recent blood pressures:  02/10- 117/61  02/09- 117/70, 92/43  Recent weights:  02/02- 105 lbs  01/04- 107.1 lbs  12/02- 105.9 lbs         Past Medical History:  Diagnosis Date   BPH (benign prostatic hyperplasia) 10/14/2014   Cervicalgia 01/04/2016   Chronic kidney disease    Contusion of left shoulder 05/27/14   DM type 2 (diabetes mellitus, type 2) ([place])    Dysphagia    High blood pressure    History of basal cell cancer 11/21/2005   Right temple   History of colonic polyps 10/14/2003   Hyperlipidemia    Left knee pain 05/27/14   Peripheral neuropathic pain    Peripheral vascular disease ([place])    SAH (subarachnoid hemorrhage) ([place]) 2009   TBI   Stroke [facility])    TIA (transient ischemic attack) 11/06/2005   Left eye pain. Slurred speech. Diaphoresis. No residual effects.    Past Surgical History:  Procedure Laterality Date  ABDOMINAL AORTOGRAM W/LOWER EXTREMITY N/A 06/12/2018   Procedure: ABDOMINAL AORTOGRAM W/LOWER EXTREMITY;  Surgeon: Marty Heck, MD;  Location: [facility];  Service: Cardiovascular;  Laterality: N/A;   Achilles tendon repair Left 1998   ruptured tendon   AMPUTATION Left 10/11/2018   Procedure:  Left  AMPUTATION ABOVE KNEE;  Surgeon: Marty Heck, MD;  Location: [facility];  Service: Vascular;  Laterality: Left;   AMPUTATION Right 02/07/2019   Procedure: AMPUTATION ABOVE KNEE;  Surgeon: Rosetta Posner, MD;  Location: [facility];  Service: Vascular;  Laterality: Right;   COLONOSCOPY  1992   Polyp removed   COLONOSCOPY  1998   normal   ORIF FEMUR FRACTURE Right 1929   PERIPHERAL VASCULAR INTERVENTION Left 06/12/2018   Procedure: PERIPHERAL VASCULAR INTERVENTION;  Surgeon: Marty Heck, MD;  Location: [place];  Service: Cardiovascular;  Laterality: Left;  Attempted    TONSILLECTOMY      Allergies  Allergen Reactions   Tape Other (See Comments)    TAPE WILL TEAR THE SKIN!!!!    Outpatient Encounter Medications as of 08/05/2021  Medication Sig   acetaminophen (TYLENOL) 650 MG CR tablet Take 650 mg by mouth every 8 (eight) hours as needed for pain.   apixaban (ELIQUIS) 2.5 MG TABS tablet Take 2.5 mg by mouth 2 (two) times daily. Morning: 7am-11am Evening: 7:30pm-11pm   bisacodyl (DULCOLAX) 10 MG suppository Place 10 mg rectally daily as needed for moderate constipation.   Calcium Carbonate-Vitamin D 600-400 MG-UNIT tablet Take 1 tablet by mouth daily. Morning: 7am-11am.   cholecalciferol (VITAMIN D) 25 MCG (1000 UNIT) tablet Take 1 tablet by mouth daily.   gabapentin (NEURONTIN) 100 MG capsule Take 100 mg by mouth 2 (two) times daily. 7:30pm-11pm.   metFORMIN (GLUCOPHAGE) 500 MG tablet Take 500 mg by mouth 2 (two) times daily with a meal. Morning: 7am-9am Evening: 5pm-7pm.   metoprolol tartrate (LOPRESSOR) 25 MG tablet Take 12.5 mg by mouth 2 (two) times daily. Morning: 7am-11am Evening: 7pm-11pm   Propylene Glycol-Glycerin 1-0.3 % SOLN Place 2 drops into both eyes daily as needed (dry eyes).    sennosides-docusate sodium (SENOKOT-S) 8.6-50 MG tablet Take 2 tablets by mouth at bedtime as needed.    simvastatin (ZOCOR) 10 MG tablet Take 10 mg by mouth daily. Between  the hours of 5pm-7pm.   vitamin B-12 (CYANOCOBALAMIN) 500 MCG tablet Take 500 mcg by mouth daily.   zinc oxide 20 % ointment Apply 1 application topically as needed for irritation. To buttocks after every incontinent episode and as needed for redness. May keep at bedside. As Needed   No facility-administered encounter medications on file as of 08/05/2021.    Review of Systems  Constitutional:  Positive for fatigue. Negative for activity change, appetite change, chills and fever.  HENT:  Negative for congestion, dental problem and trouble swallowing.   Eyes:  Negative for visual disturbance.  Respiratory:  Positive for cough. Negative for shortness of breath and wheezing.   Cardiovascular:  Negative for chest pain.  Gastrointestinal:  Negative for abdominal distention, abdominal pain, blood in stool, constipation, diarrhea, nausea and vomiting.  Genitourinary:  Negative for dysuria, frequency and hematuria.  Musculoskeletal:  Positive for gait problem.  Skin:  Negative for wound.  Neurological:  Positive for weakness. Negative for dizziness and headaches.  Psychiatric/Behavioral:  Negative for confusion, decreased concentration, dysphoric mood and sleep disturbance. The patient is not nervous/anxious.    Immunization History  Administered Date(s) Administered  DTaP 12/31/2012   Influenza, High Dose Seasonal PF 04/04/2017, 04/10/2019   Influenza,inj,Quad PF,6+ Mos 03/28/2018   Influenza-Unspecified 04/10/2014, 03/25/2015, 04/06/2016, 03/30/2020, 04/20/2021   Moderna SARS-COV2 Booster Vaccination 05/10/2020, 12/01/2020   Moderna Sars-Covid-2 Vaccination 06/30/2019, 07/28/2019   PPD Test 04/29/2014   Pneumococcal Conjugate-13 04/28/2017   Pneumococcal-Unspecified 09/25/2003, 03/13/2011   Td 12/24/2012   Tdap 04/28/2017   Zoster Recombinat (Shingrix) 10/05/2017, 12/14/2017   Pertinent  Health Maintenance Due  Topic Date Due   OPHTHALMOLOGY EXAM  07/01/2020   HEMOGLOBIN A1C   10/03/2021   INFLUENZA VACCINE  Completed   FOOT EXAM  Discontinued   URINE MICROALBUMIN  Discontinued   Fall Risk 02/09/2019 02/09/2019 02/10/2019 10/31/2020 01/07/2021  Falls in the past year? - - - - 1  Was there an injury with Fall? - - - - 0  Fall Risk Category Calculator - - - - 2  Fall Risk Category - - - - Moderate  Patient Fall Risk Level High fall risk High fall risk High fall risk Moderate fall risk High fall risk  Patient at Risk for Falls Due to - - - - History of fall(s);Impaired balance/gait;Impaired mobility  Fall risk Follow up - - - - Falls evaluation completed;Education provided;Falls prevention discussed   Functional Status Survey:    Vitals:   08/05/21 1357  BP: 117/61  Pulse: 88  Resp: 19  Temp: (!) 97.2 F (36.2 C)  SpO2: 98%  Weight: 105 lb (47.6 kg)  Height: 3\' 8"  (1.118 m)   Body mass index is 38.13 kg/m. Physical Exam Vitals reviewed.  Constitutional:      General: He is not in acute distress. HENT:     Head: Normocephalic.     Right Ear: There is no impacted cerumen.     Left Ear: There is no impacted cerumen.     Nose: Nose normal.     Mouth/Throat:     Mouth: Mucous membranes are moist.  Eyes:     General:        Right eye: No discharge.        Left eye: No discharge.  Neck:     Vascular: No carotid bruit.  Cardiovascular:     Rate and Rhythm: Normal rate. Rhythm irregular.     Pulses: Normal pulses.     Heart sounds: Murmur heard.  Pulmonary:     Effort: Pulmonary effort is normal. No respiratory distress.     Breath sounds: Normal breath sounds. No wheezing.  Abdominal:     General: Bowel sounds are normal. There is no distension.     Palpations: Abdomen is soft.     Tenderness: There is no abdominal tenderness.  Musculoskeletal:     Cervical back: Neck supple.     Right lower leg: No edema.     Left lower leg: No edema.     Comments: Bilateral AKA  Lymphadenopathy:     Cervical: No cervical adenopathy.  Skin:    General:  Skin is warm and dry.     Capillary Refill: Capillary refill takes less than 2 seconds.  Neurological:     General: No focal deficit present.     Mental Status: He is alert and oriented to person, place, and time.     Motor: Weakness present.     Gait: Gait abnormal.  Psychiatric:        Mood and Affect: Mood normal.        Behavior: Behavior normal.    Labs reviewed:  Recent Labs    08/10/20 0000 03/03/21 0000 04/04/21 0000  NA 139 136* 137  K 4.7 4.4 4.3  CL 102 100 99  CO2 31* 30* 31*  BUN 24* 23* 23*  CREATININE 1.2 1.0 1.0  CALCIUM 8.8 9.1 9.0   Recent Labs    08/10/20 0000 03/03/21 0000 04/04/21 0000  AST 13* 15 11*  ALT 12 9* 9*  ALKPHOS 55 53 55  ALBUMIN 3.1* 3.3* 3.0*   Recent Labs    08/10/20 0000 11/04/20 0000 03/03/21 0000 04/04/21 0000  WBC 8.2 4.8 6.7 9.6  NEUTROABS 5,150.00 2,390.00 4,308.00 6,787.00  HGB 11.5* 12.2* 12.2* 11.0*  HCT 34* 37* 36* 34*  PLT 360 320 365  --    Lab Results  Component Value Date   TSH 1.72 04/04/2021   Lab Results  Component Value Date   HGBA1C 6.8 04/04/2021   Lab Results  Component Value Date   CHOL 130 03/03/2021   HDL 43 03/03/2021   LDLCALC 71 03/03/2021   TRIG 82 03/03/2021   CHOLHDL 2.6 08/29/2018    Significant Diagnostic Results in last 30 days:  No results found.  Assessment/Plan 1. Do not resuscitate  2. Controlled type 2 diabetes mellitus with stage 3 chronic kidney disease, without long-term current use of insulin (HCC) - A1c stable - sugars 100-160's - cont metformin - schedule diabetic eye exam  3. Fatigue, unspecified type - reports napping more - TSH - CBC/diff  4. Paroxysmal atrial fibrillation (HCC) - HR controlled with metoprolol - cont Eliquis for clot prevention  5. Stage 3b chronic kidney disease ([place]) - cont to avoid nephrotoxic drugs like NSAIDS and dose adjust medications to be renally excreted  6. Hyperlipidemia LDL goal <70 - cont statin  7. S/P AKA (above  knee amputation) bilateral ([place]) - cont skilled nursing care  8. Neuropathic pain - R>L, pain improved with increased gabapentin  9. Cough productive of clear sputum - lung sounds clear - unsuccessful trial of Mucinex  10. Need for pneumococcal vaccination - give pneumococcal 20 vaccine- per insurance approval    Family/ staff Communication: plan discussed with patient and nurse  Labs/tests ordered:  cbc/diff, TSH

## 2021-08-08 LAB — CBC AND DIFFERENTIAL
HCT: 32 — AB (ref 41–53)
HCT: 32 — AB (ref 41–53)
Hemoglobin: 11.2 — AB (ref 13.5–17.5)
Hemoglobin: 11.2 — AB (ref 13.5–17.5)
Neutrophils Absolute: 4913
Neutrophils Absolute: 4913
Platelets: 424 — AB (ref 150–399)
Platelets: 424 — AB (ref 150–399)
WBC: 7.7
WBC: 7.7

## 2021-08-08 LAB — CBC
RBC: 3.13 — AB (ref 3.87–5.11)
RBC: 3.13 — AB (ref 3.87–5.11)

## 2021-08-12 ENCOUNTER — Non-Acute Institutional Stay (SKILLED_NURSING_FACILITY): Payer: PPO | Admitting: Nurse Practitioner

## 2021-08-12 ENCOUNTER — Encounter: Payer: Self-pay | Admitting: Nurse Practitioner

## 2021-08-12 DIAGNOSIS — R5383 Other fatigue: Secondary | ICD-10-CM | POA: Diagnosis not present

## 2021-08-12 DIAGNOSIS — E785 Hyperlipidemia, unspecified: Secondary | ICD-10-CM

## 2021-08-12 DIAGNOSIS — N1832 Chronic kidney disease, stage 3b: Secondary | ICD-10-CM

## 2021-08-12 DIAGNOSIS — I480 Paroxysmal atrial fibrillation: Secondary | ICD-10-CM

## 2021-08-12 DIAGNOSIS — N183 Chronic kidney disease, stage 3 unspecified: Secondary | ICD-10-CM

## 2021-08-12 DIAGNOSIS — K5900 Constipation, unspecified: Secondary | ICD-10-CM

## 2021-08-12 DIAGNOSIS — D500 Iron deficiency anemia secondary to blood loss (chronic): Secondary | ICD-10-CM | POA: Diagnosis not present

## 2021-08-12 DIAGNOSIS — Z8673 Personal history of transient ischemic attack (TIA), and cerebral infarction without residual deficits: Secondary | ICD-10-CM

## 2021-08-12 DIAGNOSIS — E1122 Type 2 diabetes mellitus with diabetic chronic kidney disease: Secondary | ICD-10-CM | POA: Diagnosis not present

## 2021-08-12 DIAGNOSIS — M792 Neuralgia and neuritis, unspecified: Secondary | ICD-10-CM

## 2021-08-12 NOTE — Assessment & Plan Note (Signed)
elevated CBG, 321 9am after breakfast, will Fast CBG qam x 1wk, CBG ac meals x 72hours Average CBG 112-168  blood sugar is controlled, on Metformin Simvastatin 10mg  qd. 05/19/19 urine micro albumin 69(30-299). Hgb a1c 6.8 04/04/21

## 2021-08-12 NOTE — Assessment & Plan Note (Signed)
Peripheral neuropathy, phantom pain, s/p AKA bilateral, stable, hold Gabapentin x 72 hours 2/2 fatigue/[place], continue Tylenol prn

## 2021-08-12 NOTE — Assessment & Plan Note (Signed)
stable, on Senokot S II prn, prn Bisacodyl

## 2021-08-12 NOTE — Progress Notes (Signed)
Nursing Home Room Number: 21Location:   SNF [facility] Place of Service:  SNF (31) Provider: [facility]  NP  Virgie Dad, MD  Patient Care Team: Virgie Dad, MD as PCP - General (Internal Medicine) Buford Dresser, MD as PCP - Cardiology (Cardiology) Ricard Dillon, MD as Consulting Physician (Internal Medicine) Virgie Dad, MD (Internal Medicine)  Extended Emergency Contact Information Primary Emergency Contact: Gordon,[place] Address: [address]          [place], [place] [address] [name] of [place] Phone: [phone] Mobile Phone: [phone] Relation: Daughter Secondary Emergency Contact: [facility] Phone: [phone] Relation: Son  Code Status:  DNR Goals of care: Advanced Directive information Advanced Directives 08/15/2021  Does Patient Have a Medical Advance Directive? Yes  Type of Paramedic of [place];Out of facility DNR (pink MOST or yellow form)  Does patient want to make changes to medical advance directive? No - Patient declined  Copy of [place] in Chart? Yes - validated most recent copy scanned in chart (See row information), Physician notified  Pre-existing out of facility DNR order (yellow form or pink MOST form) -     Chief Complaint  Patient presents with   Acute Visit    Not feeling well    HPI:  Pt is a 88 y.o. male seen today for an acute visit for elevated CBG, 321 9am after breakfast, also c/o generalized malaise.   Fatigue, saw Amy 08/05/21, Hgb wbc 7.7, Hgb 11.2, neutrophils 63.8, TSH 2.24 08/08/21   Anemia, stable, on Fe, Vit B12. Hgb 11.2 08/08/21             Constipation, stable, on Senokot S II prn, prn Bisacodyl              Peripheral neuropathy, phantom pain, s/p AKA bilateral, stable, on Gabapentin,Tylenol prn             T2 DM, blood sugar is controlled, on Metformin Simvastatin. 05/19/19 urine micro albumin 69(30-299). Hgb a1c 6.8 04/04/21  CKD  Bun/creat 23/1.0 04/04/21             Hyperlipidemia, takes Simvastatin, LDL 71 03/03/21             Afib, heart rate is in control, on Metoprolol, Eliquis, saw Cardiology. TSH 2.24 08/08/21             Hx of CVA takes Eliquis, Simvastatin.   Chronic cough, afebrile.       Past Medical History:  Diagnosis Date   BPH (benign prostatic hyperplasia) 10/14/2014   Cervicalgia 01/04/2016   Chronic kidney disease    Contusion of left shoulder 05/27/14   DM type 2 (diabetes mellitus, type 2) ([place])    Dysphagia    High blood pressure    History of basal cell cancer 11/21/2005   Right temple   History of colonic polyps 10/14/2003   Hyperlipidemia    Left knee pain 05/27/14   Peripheral neuropathic pain    Peripheral vascular disease ([place])    SAH (subarachnoid hemorrhage) ([place]) 2009   TBI   Stroke [facility])    TIA (transient ischemic attack) 11/06/2005   Left eye pain. Slurred speech. Diaphoresis. No residual effects.    Past Surgical History:  Procedure Laterality Date   ABDOMINAL AORTOGRAM W/LOWER EXTREMITY N/A 06/12/2018   Procedure: ABDOMINAL AORTOGRAM W/LOWER EXTREMITY;  Surgeon: Marty Heck, MD;  Location: [place] [facility];  Service: Cardiovascular;  Laterality: N/A;  Achilles tendon repair Left 1998   ruptured tendon   AMPUTATION Left 10/11/2018   Procedure: Left  AMPUTATION ABOVE KNEE;  Surgeon: Marty Heck, MD;  Location: [place];  Service: Vascular;  Laterality: Left;   AMPUTATION Right 02/07/2019   Procedure: AMPUTATION ABOVE KNEE;  Surgeon: Rosetta Posner, MD;  Location: [place];  Service: Vascular;  Laterality: Right;   COLONOSCOPY  1992   Polyp removed   COLONOSCOPY  1998   normal   ORIF FEMUR FRACTURE Right 1929   PERIPHERAL VASCULAR INTERVENTION Left 06/12/2018   Procedure: PERIPHERAL VASCULAR INTERVENTION;  Surgeon: Marty Heck, MD;  Location: [place] [facility];  Service: Cardiovascular;  Laterality: Left;  Attempted    TONSILLECTOMY      Allergies   Allergen Reactions   Tape Other (See Comments)    TAPE WILL TEAR THE SKIN!!!!    Allergies as of 08/12/2021       Reactions   Tape Other (See Comments)   TAPE WILL TEAR THE SKIN!!!!        Medication List        Accurate as of August 12, 2021 11:59 PM. If you have any questions, ask your nurse or doctor.          acetaminophen 650 MG CR tablet Commonly known as: TYLENOL Take 650 mg by mouth every 8 (eight) hours as needed for pain.   apixaban 2.5 MG Tabs tablet Commonly known as: ELIQUIS Take 2.5 mg by mouth 2 (two) times daily. Morning: 7am-11am Evening: 7:30pm-11pm   bisacodyl 10 MG suppository Commonly known as: DULCOLAX Place 10 mg rectally daily as needed for moderate constipation.   Calcium Carbonate-Vitamin D 600-400 MG-UNIT tablet Take 1 tablet by mouth daily. Morning: 7am-11am.   cholecalciferol 25 MCG (1000 UNIT) tablet Commonly known as: VITAMIN D Take 1 tablet by mouth daily.   gabapentin 100 MG capsule Commonly known as: NEURONTIN Take 100 mg by mouth 2 (two) times daily. 7:30pm-11pm.   metFORMIN 500 MG tablet Commonly known as: GLUCOPHAGE Take 500 mg by mouth 2 (two) times daily with a meal. Morning: 7am-9am Evening: 5pm-7pm.   metoprolol tartrate 25 MG tablet Commonly known as: LOPRESSOR Take 12.5 mg by mouth 2 (two) times daily. Morning: 7am-11am Evening: 7pm-11pm   Propylene Glycol-Glycerin 1-0.3 % Soln Place 2 drops into both eyes daily as needed (dry eyes).   sennosides-docusate sodium 8.6-50 MG tablet Commonly known as: SENOKOT-S Take 2 tablets by mouth at bedtime as needed.   simvastatin 10 MG tablet Commonly known as: ZOCOR Take 10 mg by mouth daily. Between the hours of 5pm-7pm.   vitamin B-12 500 MCG tablet Commonly known as: CYANOCOBALAMIN Take 500 mcg by mouth daily.   zinc oxide 20 % ointment Apply 1 application topically as needed for irritation. To buttocks after every incontinent episode and as needed for  redness. May keep at bedside. As Needed        Review of Systems  Constitutional:  Positive for fatigue. Negative for appetite change and fever.  HENT:  Positive for hearing loss and trouble swallowing. Negative for congestion.   Eyes:  Negative for visual disturbance.  Respiratory:  Positive for cough. Negative for chest tightness and shortness of breath.        Chronic cough with swallowing.   Cardiovascular:  Negative for leg swelling.  Gastrointestinal:  Negative for abdominal pain and constipation.  Genitourinary:  Negative for dysuria and urgency.  Musculoskeletal:  Positive for gait problem.  S/p Bil AKA  Skin:  Negative for color change.  Neurological:  Negative for speech difficulty, weakness and light-headedness.  Psychiatric/Behavioral:  Negative for confusion and sleep disturbance. The patient is not nervous/anxious.    Immunization History  Administered Date(s) Administered   DTaP 12/31/2012   Influenza, High Dose Seasonal PF 04/04/2017, 04/10/2019   Influenza,inj,Quad PF,6+ Mos 03/28/2018   Influenza-Unspecified 04/10/2014, 03/25/2015, 04/06/2016, 03/30/2020, 04/20/2021   Moderna SARS-COV2 Booster Vaccination 05/10/2020, 12/01/2020   Moderna Sars-Covid-2 Vaccination 06/30/2019, 07/28/2019   PPD Test 04/29/2014   Pneumococcal Conjugate-13 04/28/2017   Pneumococcal-Unspecified 09/25/2003, 03/13/2011   Td 12/24/2012   Tdap 04/28/2017   Zoster Recombinat (Shingrix) 10/05/2017, 12/14/2017   Pertinent  Health Maintenance Due  Topic Date Due   OPHTHALMOLOGY EXAM  07/01/2020   HEMOGLOBIN A1C  10/03/2021   INFLUENZA VACCINE  Completed   FOOT EXAM  Discontinued   URINE MICROALBUMIN  Discontinued   Fall Risk 02/09/2019 02/10/2019 10/31/2020 01/07/2021 08/15/2021  Falls in the past year? - - - 1 -  Was there an injury with Fall? - - - 0 -  Fall Risk Category Calculator - - - 2 -  Fall Risk Category - - - Moderate -  Patient Fall Risk Level High fall risk High fall  risk Moderate fall risk High fall risk Moderate fall risk  Patient at Risk for Falls Due to - - - History of fall(s);Impaired balance/gait;Impaired mobility -  Fall risk Follow up - - - Falls evaluation completed;Education provided;Falls prevention discussed -   Functional Status Survey:    Vitals:   08/12/21 1240  BP: 132/69  Pulse: 88  Resp: 20  Temp: 98 F (36.7 C)  SpO2: 92%   There is no height or weight on file to calculate BMI. Physical Exam Vitals and nursing note reviewed.  HENT:     Head: Normocephalic and atraumatic.     Nose: Nose normal.     Mouth/Throat:     Mouth: Mucous membranes are moist.  Eyes:     Extraocular Movements: Extraocular movements intact.     Conjunctiva/sclera: Conjunctivae normal.     Pupils: Pupils are equal, round, and reactive to light.     Comments: Right lower eyelid ectropion   Cardiovascular:     Rate and Rhythm: Normal rate and regular rhythm.     Heart sounds: Murmur heard.  Pulmonary:     Effort: Pulmonary effort is normal.     Breath sounds: No rales.     Comments: Left basilar rales.  Abdominal:     Palpations: Abdomen is soft.     Tenderness: There is no abdominal tenderness.  Musculoskeletal:     Cervical back: Normal range of motion and neck supple.     Right lower leg: No edema.     Left lower leg: No edema.     Comments: S/p Bil AKA  Skin:    General: Skin is warm and dry.     Comments: R palm ganglion cyst.   Neurological:     General: No focal deficit present.     Mental Status: He is alert and oriented to person, place, and time. Mental status is at baseline.     Comments: Not ambulatory   Psychiatric:        Mood and Affect: Mood normal.        Behavior: Behavior normal.        Thought Content: Thought content normal.        Judgment: Judgment normal.  Labs reviewed: Recent Labs    03/03/21 0000 04/04/21 0000  NA 136* 137  K 4.4 4.3  CL 100 99  CO2 30* 31*  BUN 23* 23*  CREATININE 1.0 1.0   CALCIUM 9.1 9.0   Recent Labs    03/03/21 0000 04/04/21 0000  AST 15 11*  ALT 9* 9*  ALKPHOS 53 55  ALBUMIN 3.3* 3.0*   Recent Labs    03/03/21 0000 04/04/21 0000 08/08/21 0000 08/15/21 1146  WBC 6.7 9.6 7.7   7.7 26.3*  NEUTROABS 4,308.00 6,787.00 4,913.00   4,913.00 PENDING  HGB 12.2* 11.0* 11.2*   11.2* 12.0*  HCT 36* 34* 32*   32* 37.6*  MCV  --   --   --  103.0*  PLT 365  --  424*   424* 491*   Lab Results  Component Value Date   TSH 1.72 04/04/2021   Lab Results  Component Value Date   HGBA1C 6.8 04/04/2021   Lab Results  Component Value Date   CHOL 130 03/03/2021   HDL 43 03/03/2021   LDLCALC 71 03/03/2021   TRIG 82 03/03/2021   CHOLHDL 2.6 08/29/2018    Significant Diagnostic Results in last 30 days:  DG Chest Port 1 View  Result Date: 08/15/2021 CLINICAL DATA:  Cough. Left-sided chest pain for 3 days worsened today. Shortness of breath. EXAM: PORTABLE CHEST 1 VIEW COMPARISON:  AP chest 04/06/2018 FINDINGS: There is homogeneous opacification of the inferior 50% of the left hemithorax with less dense opacification of the superior 50%. Findings are suggestive of a new moderate-to-large left pleural effusion tracking up the lateral left pleura with left lung airspace opacities. The right lung is clear. No pneumothorax. The visualized right cardiac contours are unchanged. Calcification is again seen within the aortic arch. Moderate S shaped thoracic curvature is similar to prior. IMPRESSION: New moderate to large left pleural effusion with associated left lung airspace opacities, possible atelectasis versus pneumonia. Electronically Signed   By: Yvonne Kendall M.D.   On: 08/15/2021 11:54    Assessment/Plan Controlled type 2 diabetes mellitus with stage 3 chronic kidney disease, without long-term current use of insulin (HCC)  elevated CBG, 321 9am after breakfast, will Fast CBG qam x 1wk, CBG ac meals x 72hours Average CBG 112-168  blood sugar is controlled, on  Metformin Simvastatin 34m qd. 05/19/19 urine micro albumin 69(30-299). Hgb a1c 6.8 04/04/21   Fatigue c/o generalized malaise, fatigue, saw Amy 08/05/21, Hgb wbc 7.7, Hgb 11.2, neutrophils 63.8, TSH 2.24 08/08/21 08/12/21 Hold Gabapentin, VS q shift, CBG with meals x 72hours.  Update CMP/eGFR   Blood loss anemia  stable, on Fe, Vit B12. Hgb 11.2 08/08/21  Constipation stable, on Senokot S II prn, prn Bisacodyl   Neuropathic pain Peripheral neuropathy, phantom pain, s/p AKA bilateral, stable, hold Gabapentin x 72 hours 2/2 fatigue/[place]ais, continue Tylenol prn  CKD (chronic kidney disease) stage 3, GFR 30-59 ml/min (HCC) Bun/creat 23/1.0 04/04/21  Hyperlipidemia LDL goal <70  takes Simvastatin, LDL 71 03/03/21  Paroxysmal atrial fibrillation (HCC) heart rate is in control, on Metoprolol, Eliquis, saw Cardiology. TSH 2.24 08/08/21  History of CVA (cerebrovascular accident) Eliquis, Simvastatin.     Family/ staff Communication: plan of care reviewed with the patient and charge nurse.   Labs/tests ordered:  none   Time spend 35 minutes.

## 2021-08-12 NOTE — Assessment & Plan Note (Signed)
Eliquis, Simvastatin.

## 2021-08-12 NOTE — Assessment & Plan Note (Signed)
c/o generalized malaise, fatigue, saw Amy 08/05/21, Hgb wbc 7.7, Hgb 11.2, neutrophils 63.8, TSH 2.24 08/08/21 08/12/21 Hold Gabapentin, VS q shift, CBG with meals x 72hours.  Update CMP/eGFR

## 2021-08-12 NOTE — Assessment & Plan Note (Signed)
heart rate is in control, on Metoprolol, Eliquis, saw Cardiology. TSH 2.24 08/08/21

## 2021-08-12 NOTE — Assessment & Plan Note (Signed)
takes Simvastatin, LDL 71 03/03/21

## 2021-08-12 NOTE — Assessment & Plan Note (Signed)
Bun/creat 23/1.0 04/04/21

## 2021-08-12 NOTE — Assessment & Plan Note (Signed)
stable, on Fe, Vit B12. Hgb 11.2 08/08/21

## 2021-08-15 ENCOUNTER — Encounter: Payer: Self-pay | Admitting: Orthopedic Surgery

## 2021-08-15 ENCOUNTER — Encounter (HOSPITAL_COMMUNITY): Payer: Self-pay | Admitting: Internal Medicine

## 2021-08-15 ENCOUNTER — Inpatient Hospital Stay (HOSPITAL_COMMUNITY)
Admission: EM | Admit: 2021-08-15 | Discharge: 2021-08-20 | DRG: 193 | Disposition: A | Discharge status: Skilled Nursing Facility | Payer: PPO | Attending: Internal Medicine | Admitting: Internal Medicine

## 2021-08-15 ENCOUNTER — Emergency Department (HOSPITAL_COMMUNITY): Payer: PPO

## 2021-08-15 ENCOUNTER — Non-Acute Institutional Stay (SKILLED_NURSING_FACILITY): Payer: PPO | Admitting: Orthopedic Surgery

## 2021-08-15 ENCOUNTER — Encounter: Payer: Self-pay | Admitting: Nurse Practitioner

## 2021-08-15 ENCOUNTER — Encounter (HOSPITAL_COMMUNITY): Payer: Self-pay | Admitting: Family Medicine

## 2021-08-15 DIAGNOSIS — Z20822 Contact with and (suspected) exposure to covid-19: Secondary | ICD-10-CM | POA: Diagnosis present

## 2021-08-15 DIAGNOSIS — I480 Paroxysmal atrial fibrillation: Secondary | ICD-10-CM | POA: Diagnosis present

## 2021-08-15 DIAGNOSIS — Z515 Encounter for palliative care: Secondary | ICD-10-CM

## 2021-08-15 DIAGNOSIS — Z7901 Long term (current) use of anticoagulants: Secondary | ICD-10-CM | POA: Diagnosis not present

## 2021-08-15 DIAGNOSIS — Z7189 Other specified counseling: Secondary | ICD-10-CM | POA: Diagnosis not present

## 2021-08-15 DIAGNOSIS — R0602 Shortness of breath: Secondary | ICD-10-CM | POA: Diagnosis present

## 2021-08-15 DIAGNOSIS — R1311 Dysphagia, oral phase: Secondary | ICD-10-CM | POA: Diagnosis present

## 2021-08-15 DIAGNOSIS — Z6837 Body mass index (BMI) 37.0-37.9, adult: Secondary | ICD-10-CM

## 2021-08-15 DIAGNOSIS — Z87891 Personal history of nicotine dependence: Secondary | ICD-10-CM

## 2021-08-15 DIAGNOSIS — J9601 Acute respiratory failure with hypoxia: Secondary | ICD-10-CM | POA: Diagnosis present

## 2021-08-15 DIAGNOSIS — N183 Chronic kidney disease, stage 3 unspecified: Secondary | ICD-10-CM | POA: Diagnosis present

## 2021-08-15 DIAGNOSIS — J90 Pleural effusion, not elsewhere classified: Secondary | ICD-10-CM | POA: Diagnosis present

## 2021-08-15 DIAGNOSIS — E785 Hyperlipidemia, unspecified: Secondary | ICD-10-CM | POA: Diagnosis present

## 2021-08-15 DIAGNOSIS — Z7984 Long term (current) use of oral hypoglycemic drugs: Secondary | ICD-10-CM

## 2021-08-15 DIAGNOSIS — Z79899 Other long term (current) drug therapy: Secondary | ICD-10-CM

## 2021-08-15 DIAGNOSIS — Z66 Do not resuscitate: Secondary | ICD-10-CM | POA: Diagnosis present

## 2021-08-15 DIAGNOSIS — N1831 Chronic kidney disease, stage 3a: Secondary | ICD-10-CM | POA: Diagnosis present

## 2021-08-15 DIAGNOSIS — I129 Hypertensive chronic kidney disease with stage 1 through stage 4 chronic kidney disease, or unspecified chronic kidney disease: Secondary | ICD-10-CM | POA: Diagnosis present

## 2021-08-15 DIAGNOSIS — J189 Pneumonia, unspecified organism: Secondary | ICD-10-CM | POA: Diagnosis present

## 2021-08-15 DIAGNOSIS — Z85828 Personal history of other malignant neoplasm of skin: Secondary | ICD-10-CM

## 2021-08-15 DIAGNOSIS — Z8249 Family history of ischemic heart disease and other diseases of the circulatory system: Secondary | ICD-10-CM

## 2021-08-15 DIAGNOSIS — E1122 Type 2 diabetes mellitus with diabetic chronic kidney disease: Secondary | ICD-10-CM | POA: Diagnosis present

## 2021-08-15 DIAGNOSIS — F0390 Unspecified dementia without behavioral disturbance: Secondary | ICD-10-CM | POA: Diagnosis present

## 2021-08-15 DIAGNOSIS — N400 Enlarged prostate without lower urinary tract symptoms: Secondary | ICD-10-CM | POA: Diagnosis present

## 2021-08-15 DIAGNOSIS — Z8719 Personal history of other diseases of the digestive system: Secondary | ICD-10-CM

## 2021-08-15 DIAGNOSIS — N179 Acute kidney failure, unspecified: Secondary | ICD-10-CM | POA: Diagnosis present

## 2021-08-15 DIAGNOSIS — E1151 Type 2 diabetes mellitus with diabetic peripheral angiopathy without gangrene: Secondary | ICD-10-CM | POA: Diagnosis present

## 2021-08-15 DIAGNOSIS — E870 Hyperosmolality and hypernatremia: Secondary | ICD-10-CM

## 2021-08-15 DIAGNOSIS — E872 Acidosis, unspecified: Secondary | ICD-10-CM | POA: Diagnosis present

## 2021-08-15 DIAGNOSIS — N1832 Chronic kidney disease, stage 3b: Secondary | ICD-10-CM | POA: Diagnosis not present

## 2021-08-15 DIAGNOSIS — J918 Pleural effusion in other conditions classified elsewhere: Secondary | ICD-10-CM | POA: Diagnosis present

## 2021-08-15 DIAGNOSIS — R1310 Dysphagia, unspecified: Secondary | ICD-10-CM | POA: Diagnosis not present

## 2021-08-15 DIAGNOSIS — Z8673 Personal history of transient ischemic attack (TIA), and cerebral infarction without residual deficits: Secondary | ICD-10-CM

## 2021-08-15 DIAGNOSIS — R079 Chest pain, unspecified: Secondary | ICD-10-CM

## 2021-08-15 DIAGNOSIS — Z888 Allergy status to other drugs, medicaments and biological substances status: Secondary | ICD-10-CM

## 2021-08-15 DIAGNOSIS — E669 Obesity, unspecified: Secondary | ICD-10-CM | POA: Diagnosis present

## 2021-08-15 DIAGNOSIS — R1313 Dysphagia, pharyngeal phase: Secondary | ICD-10-CM | POA: Diagnosis present

## 2021-08-15 DIAGNOSIS — Z89612 Acquired absence of left leg above knee: Secondary | ICD-10-CM

## 2021-08-15 DIAGNOSIS — R4182 Altered mental status, unspecified: Secondary | ICD-10-CM

## 2021-08-15 DIAGNOSIS — Z89611 Acquired absence of right leg above knee: Secondary | ICD-10-CM

## 2021-08-15 LAB — CBC WITH DIFFERENTIAL/PLATELET
Abs Immature Granulocytes: 0 10*3/uL (ref 0.00–0.07)
Basophils Absolute: 0 10*3/uL (ref 0.0–0.1)
Basophils Relative: 0 %
Eosinophils Absolute: 0 10*3/uL (ref 0.0–0.5)
Eosinophils Relative: 0 %
HCT: 37.6 % — ABNORMAL LOW (ref 39.0–52.0)
Hemoglobin: 12 g/dL — ABNORMAL LOW (ref 13.0–17.0)
Lymphocytes Relative: 0 %
Lymphs Abs: 0 10*3/uL — ABNORMAL LOW (ref 0.7–4.0)
MCH: 32.9 pg (ref 26.0–34.0)
MCHC: 31.9 g/dL (ref 30.0–36.0)
MCV: 103 fL — ABNORMAL HIGH (ref 80.0–100.0)
Monocytes Absolute: 1.1 10*3/uL — ABNORMAL HIGH (ref 0.1–1.0)
Monocytes Relative: 4 %
Neutro Abs: 25.2 10*3/uL — ABNORMAL HIGH (ref 1.7–7.7)
Neutrophils Relative %: 96 %
Platelets: 491 10*3/uL — ABNORMAL HIGH (ref 150–400)
RBC: 3.65 MIL/uL — ABNORMAL LOW (ref 4.22–5.81)
RDW: 14 % (ref 11.5–15.5)
WBC: 26.3 10*3/uL — ABNORMAL HIGH (ref 4.0–10.5)
nRBC: 0 % (ref 0.0–0.2)
nRBC: 0 /100 WBC

## 2021-08-15 LAB — HEPATIC FUNCTION PANEL
ALT: 12 U/L (ref 0–44)
AST: 16 U/L (ref 15–41)
Albumin: 2 g/dL — ABNORMAL LOW (ref 3.5–5.0)
Alkaline Phosphatase: 73 U/L (ref 38–126)
Bilirubin, Direct: 0.2 mg/dL (ref 0.0–0.2)
Indirect Bilirubin: 0.5 mg/dL (ref 0.3–0.9)
Total Bilirubin: 0.7 mg/dL (ref 0.3–1.2)
Total Protein: 6.1 g/dL — ABNORMAL LOW (ref 6.5–8.1)

## 2021-08-15 LAB — RESP PANEL BY RT-PCR (FLU A&B, COVID) ARPGX2
Influenza A by PCR: NEGATIVE
Influenza B by PCR: NEGATIVE
SARS Coronavirus 2 by RT PCR: NEGATIVE

## 2021-08-15 LAB — BASIC METABOLIC PANEL
Anion gap: 10 (ref 5–15)
BUN: 54 mg/dL — ABNORMAL HIGH (ref 8–23)
CO2: 29 mmol/L (ref 22–32)
Calcium: 9.5 mg/dL (ref 8.9–10.3)
Chloride: 100 mmol/L (ref 98–111)
Creatinine, Ser: 1.48 mg/dL — ABNORMAL HIGH (ref 0.61–1.24)
GFR, Estimated: 43 mL/min — ABNORMAL LOW (ref >60)
Glucose, Bld: 208 mg/dL — ABNORMAL HIGH (ref 70–99)
Potassium: 5 mmol/L (ref 3.5–5.1)
Sodium: 139 mmol/L (ref 135–145)

## 2021-08-15 LAB — PROCALCITONIN: Procalcitonin: 1.27 ng/mL

## 2021-08-15 LAB — LACTIC ACID, PLASMA
Lactic Acid, Venous: 2.6 mmol/L (ref 0.5–1.9)
Lactic Acid, Venous: 1.9 mmol/L (ref 0.5–1.9)

## 2021-08-15 LAB — CBG MONITORING, ED: Glucose-Capillary: 211 mg/dL — ABNORMAL HIGH (ref 70–99)

## 2021-08-15 MED ORDER — SODIUM CHLORIDE 0.9 % IV SOLN
500.0000 mg | INTRAVENOUS | Status: DC
  Administered 2021-08-16 – 2021-08-20 (×5): 500 mg via INTRAVENOUS
  Filled 2021-08-15 (×6): qty 5

## 2021-08-15 MED ORDER — SODIUM CHLORIDE 0.9 % IV SOLN: INTRAVENOUS | Status: DC

## 2021-08-15 MED ORDER — ONDANSETRON HCL 4 MG/2ML IJ SOLN: 4.0000 mg | Freq: Four times a day (QID) | INTRAMUSCULAR | Status: DC | PRN

## 2021-08-15 MED ORDER — HYDRALAZINE HCL 20 MG/ML IJ SOLN: 5.0000 mg | INTRAMUSCULAR | Status: DC | PRN

## 2021-08-15 MED ORDER — SODIUM CHLORIDE 0.9% FLUSH
3.0000 mL | Freq: Two times a day (BID) | INTRAVENOUS | Status: DC
  Administered 2021-08-15 – 2021-08-19 (×7): 3 mL via INTRAVENOUS

## 2021-08-15 MED ORDER — INSULIN ASPART 100 UNIT/ML IJ SOLN
0.0000 [IU] | Freq: Three times a day (TID) | INTRAMUSCULAR | Status: DC
  Administered 2021-08-15: 3 [IU] via SUBCUTANEOUS
  Administered 2021-08-16: 1 [IU] via SUBCUTANEOUS
  Administered 2021-08-16: 3 [IU] via SUBCUTANEOUS
  Administered 2021-08-16: 1 [IU] via SUBCUTANEOUS
  Administered 2021-08-17: 2 [IU] via SUBCUTANEOUS
  Administered 2021-08-17: 5 [IU] via SUBCUTANEOUS
  Administered 2021-08-18 (×2): 2 [IU] via SUBCUTANEOUS
  Administered 2021-08-18 – 2021-08-19 (×2): 1 [IU] via SUBCUTANEOUS
  Administered 2021-08-19: 5 [IU] via SUBCUTANEOUS
  Administered 2021-08-19: 2 [IU] via SUBCUTANEOUS
  Administered 2021-08-20: 1 [IU] via SUBCUTANEOUS
  Administered 2021-08-20: 2 [IU] via SUBCUTANEOUS

## 2021-08-15 MED ORDER — ACETAMINOPHEN 650 MG RE SUPP: 650.0000 mg | Freq: Four times a day (QID) | RECTAL | Status: DC | PRN

## 2021-08-15 MED ORDER — ACETAMINOPHEN 325 MG PO TABS: 650.0000 mg | ORAL_TABLET | Freq: Four times a day (QID) | ORAL | Status: DC | PRN

## 2021-08-15 MED ORDER — APIXABAN 2.5 MG PO TABS
2.5000 mg | ORAL_TABLET | Freq: Two times a day (BID) | ORAL | Status: DC
  Administered 2021-08-15 – 2021-08-20 (×10): 2.5 mg via ORAL
  Filled 2021-08-15 (×10): qty 1

## 2021-08-15 MED ORDER — SODIUM CHLORIDE 0.9 % IV SOLN
500.0000 mg | Freq: Once | INTRAVENOUS | Status: AC
  Administered 2021-08-15: 500 mg via INTRAVENOUS
  Filled 2021-08-15: qty 5

## 2021-08-15 MED ORDER — SIMVASTATIN 20 MG PO TABS
10.0000 mg | ORAL_TABLET | Freq: Every day | ORAL | Status: DC
  Administered 2021-08-15 – 2021-08-19 (×4): 10 mg via ORAL
  Filled 2021-08-15 (×4): qty 1

## 2021-08-15 MED ORDER — POLYVINYL ALCOHOL 1.4 % OP SOLN
2.0000 [drp] | Freq: Every day | OPHTHALMIC | Status: DC | PRN
  Filled 2021-08-15: qty 15

## 2021-08-15 MED ORDER — METHOCARBAMOL 500 MG PO TABS: 500.0000 mg | ORAL_TABLET | Freq: Three times a day (TID) | ORAL | Status: DC | PRN

## 2021-08-15 MED ORDER — SODIUM CHLORIDE 0.9 % IV SOLN
1.0000 g | Freq: Once | INTRAVENOUS | Status: AC
  Administered 2021-08-15: 1 g via INTRAVENOUS
  Filled 2021-08-15: qty 10

## 2021-08-15 MED ORDER — GABAPENTIN 100 MG PO CAPS
100.0000 mg | ORAL_CAPSULE | Freq: Two times a day (BID) | ORAL | Status: DC
  Administered 2021-08-15 – 2021-08-20 (×10): 100 mg via ORAL
  Filled 2021-08-15 (×10): qty 1

## 2021-08-15 MED ORDER — GUAIFENESIN ER 600 MG PO TB12: 600.0000 mg | ORAL_TABLET | Freq: Two times a day (BID) | ORAL | Status: DC | PRN

## 2021-08-15 MED ORDER — POLYETHYLENE GLYCOL 3350 17 G PO PACK: 17.0000 g | PACK | Freq: Every day | ORAL | Status: DC | PRN

## 2021-08-15 MED ORDER — OXYCODONE HCL 5 MG PO TABS
5.0000 mg | ORAL_TABLET | ORAL | Status: DC | PRN
  Administered 2021-08-16 – 2021-08-17 (×2): 5 mg via ORAL
  Filled 2021-08-15 (×2): qty 1

## 2021-08-15 MED ORDER — DOCUSATE SODIUM 100 MG PO CAPS
100.0000 mg | ORAL_CAPSULE | Freq: Two times a day (BID) | ORAL | Status: DC
  Administered 2021-08-15 – 2021-08-20 (×10): 100 mg via ORAL
  Filled 2021-08-15 (×10): qty 1

## 2021-08-15 MED ORDER — SODIUM CHLORIDE 0.9 % IV SOLN
1.0000 g | INTRAVENOUS | Status: DC
  Administered 2021-08-16 – 2021-08-20 (×5): 1 g via INTRAVENOUS
  Filled 2021-08-15 (×5): qty 10

## 2021-08-15 MED ORDER — ALBUTEROL SULFATE (2.5 MG/3ML) 0.083% IN NEBU: 2.5000 mg | INHALATION_SOLUTION | RESPIRATORY_TRACT | Status: DC | PRN

## 2021-08-15 MED ORDER — METOPROLOL TARTRATE 12.5 MG HALF TABLET
12.5000 mg | ORAL_TABLET | Freq: Two times a day (BID) | ORAL | Status: DC
  Administered 2021-08-16 – 2021-08-20 (×8): 12.5 mg via ORAL
  Filled 2021-08-15 (×9): qty 1

## 2021-08-15 MED ORDER — SODIUM CHLORIDE 0.9 % IV BOLUS
250.0000 mL | Freq: Once | INTRAVENOUS | Status: AC
  Administered 2021-08-15: 250 mL via INTRAVENOUS

## 2021-08-15 MED ORDER — ONDANSETRON HCL 4 MG PO TABS: 4.0000 mg | ORAL_TABLET | Freq: Four times a day (QID) | ORAL | Status: DC | PRN

## 2021-08-15 MED ORDER — BISACODYL 10 MG RE SUPP: 10.0000 mg | Freq: Every day | RECTAL | Status: DC | PRN

## 2021-08-15 NOTE — Assessment & Plan Note (Addendum)
-  Patient presented with hypoxia, worsening cough, chest x-ray; new moderate to large left pleural effusion and associated left lung airspace opacity. -Prior history of aspiration. Speech therapy following, planning MBS.  -Influenza negative. -COVID-19 negative. -Schedule Mucinex -Flutter valve incentive spirometry -Declined thoracentesis -Continue with IV ceftriaxone and azithromycin -Palliative  care consulted for goals of care, plan to continue with antibiotics, discharge home with hospice.

## 2021-08-15 NOTE — ED Triage Notes (Signed)
Left sided chest pain since Friday that worsened today with sob and low O2 sats in the 80s. Not normally on O2. O2 90% on 3LPM [place] with EMS. Currently 95% on 5LPM [place]. Alert and oriented x 4.   BP 116/60 HR 70 Spo2 95% on 5LPM [place] RR 22

## 2021-08-15 NOTE — Assessment & Plan Note (Addendum)
-  B AKAs -Appear to be healing well -He is usually able to transfer well but is currently too weak -PT/OT consulted. ALF.

## 2021-08-15 NOTE — H&P (Signed)
History and Physical    Patient: Nathan Cook DOB: [birth year] DOA: 08/15/2021 DOS: the patient was seen and examined on 08/15/2021 PCP: Virgie Dad, MD  Patient coming from: SNF - [facility]; NOK: Daughter, Bowring, [phone]   Chief Complaint: chest pain  HPI: Nathan Cook is a 88 y.o. male with medical history significant of BPH; CKD: DM; HTN; HLD; afib on Eliquis; PVD with B AKA; and CVA presenting with chest pain.   Last week he felt poorly.  Friday, he had L-sided abdominal pain.  Doctors looked at him and thought he had pulled a muscle with transfers.  Saturday, he rested.  Sunday, the doctors were very concerned but he didn't want to come in and today he agreed to come.  Pulse ox was low, glucose was high.  +cough, unchanged from baseline.  He is having trouble sitting himself up and this weakens his cough.  No fever.  Decreased PO intake.    ER Course:   2-3 days of L chest pain.  Worsening cough, h/o CVA and not clearing secretions well.  No O2 requirement at baseline, here on 5L to keep 92%.  Marked rhonchi with large L PNA.  WBC 26.  Lactate 2.6,  AKI 1 -> 1.4, given 250 cc.  Given Rocephin/Azithromycin.     Review of Systems: As mentioned in the history of present illness. All other systems reviewed and are negative. Past Medical History:  Diagnosis Date   BPH (benign prostatic hyperplasia) 10/14/2014   Cervicalgia 01/04/2016   Chronic kidney disease    Contusion of left shoulder 05/27/14   DM type 2 (diabetes mellitus, type 2) ([place])    Dysphagia    High blood pressure    History of basal cell cancer 11/21/2005   Right temple   History of colonic polyps 10/14/2003   Hyperlipidemia    Left knee pain 05/27/14   Peripheral neuropathic pain    Peripheral vascular disease ([place])    SAH (subarachnoid hemorrhage) ([place]) 2009   TBI   Stroke [facility])    TIA (transient ischemic attack) 11/06/2005   Left eye pain. Slurred speech.  Diaphoresis. No residual effects.    Past Surgical History:  Procedure Laterality Date   ABDOMINAL AORTOGRAM W/LOWER EXTREMITY N/A 06/12/2018   Procedure: ABDOMINAL AORTOGRAM W/LOWER EXTREMITY;  Surgeon: Marty Heck, MD;  Location: [place] [facility];  Service: Cardiovascular;  Laterality: N/A;   Achilles tendon repair Left 1998   ruptured tendon   AMPUTATION Left 10/11/2018   Procedure: Left  AMPUTATION ABOVE KNEE;  Surgeon: Marty Heck, MD;  Location: [place];  Service: Vascular;  Laterality: Left;   AMPUTATION Right 02/07/2019   Procedure: AMPUTATION ABOVE KNEE;  Surgeon: Rosetta Posner, MD;  Location: [place];  Service: Vascular;  Laterality: Right;   COLONOSCOPY  1992   Polyp removed   COLONOSCOPY  1998   normal   ORIF FEMUR FRACTURE Right 1929   PERIPHERAL VASCULAR INTERVENTION Left 06/12/2018   Procedure: PERIPHERAL VASCULAR INTERVENTION;  Surgeon: Marty Heck, MD;  Location: [place] CV [facility];  Service: Cardiovascular;  Laterality: Left;  Attempted    TONSILLECTOMY     Social History:  reports that he quit smoking about 50 years ago. His smoking use included cigarettes. He has never used smokeless tobacco. He reports that he does not currently use alcohol after a past usage of about 1.0 - 2.0 standard drink per week. He reports that he does  not use drugs.  Allergies  Allergen Reactions   Tape Other (See Comments)    TAPE WILL TEAR THE SKIN!!!!    Family History  Problem Relation Age of Onset   Heart attack Mother    Heart disease Mother    Heart disease Father     Prior to Admission medications   Medication Sig Start Date End Date Taking? Authorizing Provider  acetaminophen (TYLENOL) 650 MG CR tablet Take 650 mg by mouth every 8 (eight) hours as needed for pain. Do not exceed 3,000mg  in 24 hours. 02/13/19  Yes [provider]  apixaban (ELIQUIS) 2.5 MG TABS tablet Take 2.5 mg by mouth 2 (two) times daily. Morning: 7am-11am Evening:  7:30pm-11pm   Yes [provider]  bisacodyl (DULCOLAX) 10 MG suppository Place 10 mg rectally daily as needed for moderate constipation.   Yes [provider]  Calcium Carbonate-Vitamin D 600-400 MG-UNIT tablet Take 1 tablet by mouth daily. Morning: 7am-11am.   Yes [provider]  cholecalciferol (VITAMIN D) 25 MCG (1000 UNIT) tablet Take 1 tablet by mouth daily.   Yes [provider]  gabapentin (NEURONTIN) 100 MG capsule Take 100 mg by mouth 2 (two) times daily.   Yes [provider]  Infant Care Products (BABY SHAMPOO) SHAM Place 1 application into both eyes See admin instructions. Wash eyes with baby shampoo; resident requests doing it himself and in the am. Uses QD.   Yes [provider]  metFORMIN (GLUCOPHAGE) 500 MG tablet Take 500 mg by mouth 2 (two) times daily with a meal. Morning: 7am-9am Evening: 5pm-7pm.   Yes [provider]  methocarbamol (ROBAXIN) 500 MG tablet Take 500 mg by mouth every 8 (eight) hours as needed for muscle spasms.   Yes [provider]  metoprolol tartrate (LOPRESSOR) 25 MG tablet Take 12.5 mg by mouth 2 (two) times daily. Morning: 7am-11am Evening: 7pm-11pm   Yes [provider]  OVER THE COUNTER MEDICATION Take 4-6 oz by mouth daily as needed (per resident's request). MAR states that patient is to receive 4-6 ounces of wine upon request qd prn. Does not state whether they prefer red, white rose, etc...   Yes [provider]  OVER THE COUNTER MEDICATION Take 8 fluid ounces by mouth in the morning. Patient drinks fortified orange juice with breakfast every morning per SNF MAR.   Yes [provider]  Propylene Glycol-Glycerin 1-0.3 % SOLN Place 2 drops into both eyes daily as needed (dry eyes).    Yes [provider]  sennosides-docusate sodium (SENOKOT-S) 8.6-50 MG tablet Take 2 tablets by mouth at bedtime as needed.    Yes [provider]   simvastatin (ZOCOR) 10 MG tablet Take 10 mg by mouth daily. Between the hours of 5pm-7pm.   Yes [provider]  vitamin B-12 (CYANOCOBALAMIN) 500 MCG tablet Take 500 mcg by mouth daily.   Yes [provider]  zinc oxide 20 % ointment Apply 1 application topically as needed for irritation. To buttocks after every incontinent episode and as needed for redness. May keep at bedside. As Needed   Yes [provider]    Physical Exam: Vitals:   08/15/21 1515 08/15/21 1600 08/15/21 1645 08/15/21 1730  BP: 135/72 (!) 120/53 (!) 159/59 131/65  Pulse: 75 75 84 78  Resp: (!) 27 (!) 32 (!) 24 (!) 27  Temp:      TempSrc:      SpO2: 95% 100% 100% 90%  Weight:  Height:       General:  Appears frail and debilitated but is in NAD; he has a conversational pattern where he breathes after most words but this is apparently his baseline, and he is very conversant despite this Eyes:  PERRL, EOMI, normal lids, iris ENT:  grossly normal hearing, lips & tongue, mmm Neck:  no LAD, masses or thyromegaly Cardiovascular:  RRR, no m/r/g. No LE edema.  Respiratory:   Poor air movement on the left > right with diffuse rhonchi.   Mildly increased respiratory effort. 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 Left-sided  chest pain for 3 days worsened today. Shortness of breath. EXAM: PORTABLE CHEST 1 VIEW COMPARISON:  AP chest 04/06/2018 FINDINGS: There is homogeneous opacification of the inferior 50% of the left hemithorax with less dense opacification of the superior 50%. Findings are suggestive of a new moderate-to-large left pleural effusion tracking up the lateral left pleura with left lung airspace opacities. The right lung is clear. No pneumothorax. The visualized right cardiac contours are unchanged. Calcification is again seen within the aortic arch. Moderate S shaped thoracic curvature is similar to prior. IMPRESSION: New moderate to large left pleural effusion with associated left lung airspace opacities, possible atelectasis versus pneumonia. Electronically Signed   By: Yvonne Kendall M.D.   On: 08/15/2021 11:54    EKG: Independently reviewed.  NSR with rate 74;  RBBB with NSCSLT   Labs on Admission: I have personally reviewed the available labs and imaging studies at the time of the admission.  Pertinent labs:    Glucose 208 BUN 54/Creatinine 1.48/GFR 43 Albumin 2.0 Lactate 2.6 WBC 26.3; 7.7 on 2/13 Platelets 491    Assessment and Plan: * Acute respiratory failure with hypoxia ([place])- (present on admission) -Patient without home O2 requirement presenting with chest pain, cough, SOB -O2 sats 86% on room air, on 5L at the time of my evaluation -This appears to be related to PNA as well as a parapneumonic effusion; he refuses thoracentesis at this time -Will admit to telemetry and continue to follow  Goals of care, counseling/discussion -Declines thoracentesis -DNR form is present at the bedside -Wants to treat the treatable - within reason  CAP (community acquired pneumonia)- (present on admission) -Patient presenting with cough, decreased oxygen saturation, and infiltrate of most of left lung on chest x-ray -There is also some concern about aspiration on history -Influenza  negative. -COVID-19 negative. -Gram stain of sputum, blood cultures, and strep pneumo testing are ordered. -Will order lower respiratory tract procalcitonin level.  -CURB-65 score is 4, meaning that the patient has a 27.8% risk of 30-day mortality. -Pneumonia Severity Index (PSI) is Class 5, 27% mortality. -Will start Azithromycin 500 mg IV daily and Rocephin for now -NS @ 75cc/hr -Repeat CBC in am -Will add albuterol PRN -Will add Mucinex for cough  Pleural  effusion associated with pulmonary infection- (present on admission) -Moderate to large pleural effusion, likely parapneumonic -He was offered either pulm or IR consultation for thoracentesis  -Benefits of thoracentesis were explained, including improved oxygenation and shortened duration of symptoms with possible decreased mortality risk -Patient understood the procedure and declined, preferring conservative treatment only  S/P AKA (above knee amputation) bilateral ([place]) -B AKAs -Appear to be healing well -He is usually able to transfer well but is currently too weak -PT/OT consults requested  History of CVA (cerebrovascular accident) -Has residual RUE deficits as well as speech pattern dysfunction (stops to breathe after most words) and concern for dysphagia  Dysphagia -Patient with h/o dysphagia -Reports "mucus" in his throat when he eats/drinks that is a long-standing problem -Given risk of aspiration, I have ordered him to be NPO and directly communicated with ST the desire for soon swallow evaluation -He was also offered the option to eat/drink without this evaluation, with the risk of worsening aspiration and he did decline this currently  Paroxysmal atrial fibrillation ([place])- (present on admission) -Rate controlled with Lopressor -Continue Eliquis, although it is appropriate to reconsider this despite prior CVA  CKD (chronic kidney disease) stage 3, GFR 30-59 ml/min ([facility])- (present on admission) -Appears to be stage  3a at baseline -Currently slightly worse than prior -Will follow  Controlled type 2 diabetes mellitus with stage 3 chronic kidney disease, without long-term current use of insulin ([place])- (present on admission) -Most recent A1c was 6.8 -Does not need tight control - hypoglycemia is a much greater risk for him than hyperglycemia -Hold Glucophage -Will cover with sensitive-scale SSI without nighhttime coverage  Benign hypertension with chronic kidney disease, stage III ([place])- (present on admission) -Continue Lopressor -Will add prn hydralazine  Hyperlipidemia LDL goal <70- (present on admission) -Continue Zocor for now, although 10 mg Zocor in a 88yo male may not be worth the risk     Advance Care Planning:   Code Status: DNR   Consults: TOC team; PT/OT/ST  DVT Prophylaxis: Eliquis  Family Communication: Daughter was present throughout evaluation  Severity of Illness: The appropriate patient status for this patient is INPATIENT. Inpatient status is judged to be reasonable and necessary in order to provide the required intensity of service to ensure the patient's safety. The patient's presenting symptoms, physical exam findings, and initial radiographic and laboratory data in the context of their chronic comorbidities is felt to place them at high risk for further clinical deterioration. Furthermore, it is not anticipated that the patient will be medically stable for discharge from the hospital within 2 midnights of admission.   * I certify that at the point of admission it is my clinical judgment that the patient will require inpatient hospital care spanning beyond 2 midnights from the point of admission due to high intensity of service, high risk for further deterioration and high frequency of surveillance required.*  Author: Karmen Bongo, MD 08/15/2021 5:57 PM  For on call review [email or web address].

## 2021-08-15 NOTE — ED Provider Notes (Signed)
[facility] EMERGENCY DEPARTMENT Provider Note   CSN: [id number] Arrival date & time:        History  Chief Complaint  Patient presents with   Chest Pain    Nathan Cook is a 88 y.o. male.  88 yo man presents with new oxygen requirement to 5L [place]. Patient reports that he noticed some left-sided chest pain after pulling himself up to help transfer (s/p b/l AKA due to PAD). Since then he has had persistent left sided pain. He has a cough at baseline, but daughter reports that it is getting worse in the last two days. She has also noticed that since his stroke, he has a harder time clearing his throat. He denies fever, chills, n/v/d. He has some shortness of breath. EMS found him to have SpO2 in the 80s, currently stable with SpO2 >92% ON 5L . Patient has no O2 use at baseline. Patient has a history of a. Fib, on eliquis. DNR form is signed and at bedside, patient wants treatment with abx and fluid if necessary.      Home Medications Prior to Admission medications   Medication Sig Start Date End Date Taking? Authorizing Provider  acetaminophen (TYLENOL) 650 MG CR tablet Take 650 mg by mouth every 8 (eight) hours as needed for pain. Do not exceed 3,000mg  in 24 hours. 02/13/19  Yes [provider]  apixaban (ELIQUIS) 2.5 MG TABS tablet Take 2.5 mg by mouth 2 (two) times daily. Morning: 7am-11am Evening: 7:30pm-11pm   Yes [provider]  bisacodyl (DULCOLAX) 10 MG suppository Place 10 mg rectally daily as needed for moderate constipation.   Yes [provider]  Calcium Carbonate-Vitamin D 600-400 MG-UNIT tablet Take 1 tablet by mouth daily. Morning: 7am-11am.   Yes [provider]  cholecalciferol (VITAMIN D) 25 MCG (1000 UNIT) tablet Take 1 tablet by mouth daily.   Yes [provider]  gabapentin (NEURONTIN) 100 MG capsule Take 100 mg by mouth 2 (two) times daily.   Yes [provider]  Infant Care Products  (BABY SHAMPOO) SHAM Place 1 application into both eyes See admin instructions. Wash eyes with baby shampoo; resident requests doing it himself and in the am. Uses QD.   Yes [provider]  metFORMIN (GLUCOPHAGE) 500 MG tablet Take 500 mg by mouth 2 (two) times daily with a meal. Morning: 7am-9am Evening: 5pm-7pm.   Yes [provider]  methocarbamol (ROBAXIN) 500 MG tablet Take 500 mg by mouth every 8 (eight) hours as needed for muscle spasms.   Yes [provider]  metoprolol tartrate (LOPRESSOR) 25 MG tablet Take 12.5 mg by mouth 2 (two) times daily. Morning: 7am-11am Evening: 7pm-11pm   Yes [provider]  OVER THE COUNTER MEDICATION Take 4-6 oz by mouth daily as needed (per resident's request). MAR states that patient is to receive 4-6 ounces of wine upon request qd prn. Does not state whether they prefer red, white rose, etc...   Yes [provider]  OVER THE COUNTER MEDICATION Take 8 fluid ounces by mouth in the morning. Patient drinks fortified orange juice with breakfast every morning per SNF MAR.   Yes [provider]  Propylene Glycol-Glycerin 1-0.3 % SOLN Place 2 drops into both eyes daily as needed (dry eyes).    Yes [provider]  sennosides-docusate sodium (SENOKOT-S) 8.6-50 MG tablet Take 2 tablets by mouth at bedtime as needed.    Yes [provider]  simvastatin (ZOCOR) 10 MG  tablet Take 10 mg by mouth daily. Between the hours of 5pm-7pm.   Yes [provider]  vitamin B-12 (CYANOCOBALAMIN) 500 MCG tablet Take 500 mcg by mouth daily.   Yes [provider]  zinc oxide 20 % ointment Apply 1 application topically as needed for irritation. To buttocks after every incontinent episode and as needed for redness. May keep at bedside. As Needed   Yes [provider]      Allergies    Tape    Review of Systems   Review of Systems  Constitutional:  Negative for appetite change and fever.   HENT:  Negative for congestion and sore throat.   Respiratory:  Positive for cough and shortness of breath. Negative for wheezing.   Cardiovascular:  Positive for chest pain. Negative for palpitations.  Gastrointestinal:  Negative for abdominal pain, constipation, diarrhea, nausea and vomiting.  Neurological:  Negative for headaches.  All other systems reviewed and are negative.  Physical Exam Updated Vital Signs BP (!) 101/52    Pulse 71    Temp 98.2 F (36.8 C) (Oral)    Resp (!) 27    Ht 3\' 8"  (1.118 m)    Wt 47 kg    SpO2 97%    BMI 37.63 kg/m  Physical Exam Vitals and nursing note reviewed.  Constitutional:      General: He is not in acute distress.    Appearance: He is ill-appearing. He is not toxic-appearing or diaphoretic.     Comments: Thin-appearing, frail, elderly man  HENT:     Head: Normocephalic and atraumatic.  Eyes:     Pupils: Pupils are equal, round, and reactive to light.  Neck:     Vascular: No JVD.  Cardiovascular:     Rate and Rhythm: Normal rate and regular rhythm.     Pulses:          Radial pulses are 2+ on the right side and 2+ on the left side.     Heart sounds: Normal heart sounds.  Pulmonary:     Effort: Tachypnea present. No accessory muscle usage or respiratory distress.     Breath sounds: Examination of the left-upper field reveals decreased breath sounds. Examination of the left-middle field reveals decreased breath sounds and rhonchi. Examination of the left-lower field reveals decreased breath sounds and rhonchi. Decreased breath sounds and rhonchi present. No wheezing or rales.  Chest:     Chest wall: No tenderness, crepitus or edema. There is dullness to percussion.  Abdominal:     General: Bowel sounds are normal.     Palpations: Abdomen is soft.     Tenderness: There is no abdominal tenderness. There is no guarding.  Musculoskeletal:     Cervical back: Normal range of motion and neck supple.     Comments: No legs s/p b/l aka   Lymphadenopathy:     Cervical: No cervical adenopathy.  Skin:    General: Skin is warm and dry.     Coloration: Skin is pale.  Neurological:     General: No focal deficit present.     Mental Status: He is alert and oriented to person, place, and time.  Psychiatric:        Mood and Affect: Mood normal.        Behavior: Behavior normal.    ED Results / Procedures / Treatments   Labs (all labs ordered are listed, but only abnormal results are displayed) Labs Reviewed  BASIC METABOLIC PANEL - Abnormal; Notable  for the following components:      Result Value   Glucose, Bld 208 (*)    BUN 54 (*)    Creatinine, Ser 1.48 (*)    GFR, Estimated 43 (*)    All other components within normal limits  HEPATIC FUNCTION PANEL - Abnormal; Notable for the following components:   Total Protein 6.1 (*)    Albumin 2.0 (*)    All other components within normal limits  LACTIC ACID, PLASMA - Abnormal; Notable for the following components:   Lactic Acid, Venous 2.6 (*)    All other components within normal limits  CBC WITH DIFFERENTIAL/PLATELET - Abnormal; Notable for the following components:   WBC 26.3 (*)    RBC 3.65 (*)    Hemoglobin 12.0 (*)    HCT 37.6 (*)    MCV 103.0 (*)    Platelets 491 (*)    Neutro Abs 25.2 (*)    Lymphs Abs 0.0 (*)    Monocytes Absolute 1.1 (*)    All other components within normal limits  RESP PANEL BY RT-PCR (FLU A&B, COVID) [id number]  LACTIC ACID, PLASMA    EKG EKG Interpretation  Date/Time:  Monday August 15 2021 10:52:21 EST Ventricular Rate:  74 PR Interval:  228 QRS Duration: 129 QT Interval:  400 QTC Calculation: 444 R Axis:   80 Text Interpretation: Sinus rhythm Prolonged PR interval Right bundle branch block No significant change since last tracing Confirmed by Blanchie Dessert ([id number]) on 08/15/2021 11:01:13 AM  Radiology DG Chest Port 1 View  Result Date: 08/15/2021 CLINICAL DATA:  Cough. Left-sided chest pain for 3 days worsened today.  Shortness of breath. EXAM: PORTABLE CHEST 1 VIEW COMPARISON:  AP chest 04/06/2018 FINDINGS: There is homogeneous opacification of the inferior 50% of the left hemithorax with less dense opacification of the superior 50%. Findings are suggestive of a new moderate-to-large left pleural effusion tracking up the lateral left pleura with left lung airspace opacities. The right lung is clear. No pneumothorax. The visualized right cardiac contours are unchanged. Calcification is again seen within the aortic arch. Moderate S shaped thoracic curvature is similar to prior. IMPRESSION: New moderate to large left pleural effusion with associated left lung airspace opacities, possible atelectasis versus pneumonia. Electronically Signed   By: Yvonne Kendall M.D.   On: 08/15/2021 11:54    Procedures Procedures    Medications Ordered in ED Medications  azithromycin (ZITHROMAX) 500 mg in sodium chloride 0.9 % 250 mL IVPB (has no administration in time range)  cefTRIAXone (ROCEPHIN) 1 g in sodium chloride 0.9 % 100 mL IVPB (1 g Intravenous New Bag/Given 08/15/21 1337)  sodium chloride 0.9 % bolus 250 mL (250 mLs Intravenous New Bag/Given 08/15/21 1337)    ED Course/ Medical Decision Making/ A&P Clinical Course as of 08/15/21 1405  Mon Aug 15, 2021  1300 CXR shows large (>50%) pleural effusion with opacity, concerning for PNA. CBC shows leukocytosis to 26, LA 2.6. Will treat for CAP and as patient appears dry, will give NS 250 cc bolus. Will call for admission. [CM]  [place] with Dr. Lorin Mercy, hospitalist, who will accept patient for admission.  [CM]    Clinical Course User Index [CM] Gladys Damme, MD                           Medical Decision Making 88 yo man presents with 3 days of left sided chest pain in setting of worsening cough. Physical  exam with signficant rales and new O2 requirement of 5L [place]. Highly suspicious for PNA, especially aspiration pna given h/o stroke. Patient is DNR, has gold form with  him. He is at baseline mentation, satting well on 5L , stable. Unlikely to be PE given that patient is on eliquis and has not missed any doses. He is currently not in a. Fib, rate controlled. Potentially could be CHF, but not noted in chart, and patient has no signs of fluid overload on exam. Given that he is afebrile, will not start sepsis work up. Will obtain CXR, CBC, metabolic panel.   Amount and/or Complexity of Data Reviewed Independent Historian: caregiver    Details: daughter External Data Reviewed: notes. Labs: ordered. Decision-making details documented in ED Course. Radiology: ordered. Decision-making details documented in ED Course. ECG/medicine tests: ordered. Decision-making details documented in ED Course.  Risk Prescription drug management. Decision regarding hospitalization.          Final Clinical Impression(s) / ED Diagnoses Final diagnoses:  None    Rx / DC Orders ED Discharge Orders     None         Gladys Damme, MD 08/15/21 [id number]    Blanchie Dessert, MD 08/15/21 [phone]

## 2021-08-15 NOTE — Assessment & Plan Note (Addendum)
-  Continue Lopressor  -Continue with as needed hydralazine.

## 2021-08-15 NOTE — Assessment & Plan Note (Addendum)
-  Rate controlled with Lopressor -Continue Eliquis.

## 2021-08-15 NOTE — Assessment & Plan Note (Addendum)
-  Patient with h/o dysphagia -Evaluated by speech recommend Regular thin liquid diet. Had MBS; showed moderate to severe pharyngeal dysphagia. Speech recommend liquid as primary source of nutrition  and solid for pleasure.

## 2021-08-15 NOTE — Assessment & Plan Note (Addendum)
-  Has residual RUE deficits  -On eliquis

## 2021-08-15 NOTE — Assessment & Plan Note (Addendum)
-  Moderate to large pleural effusion, likely parapneumonic -Decline paracentesis.

## 2021-08-15 NOTE — Progress Notes (Signed)
Location:  [facility] Room Number: N21/A Place of Service:  SNF [phone]) Provider:  Yvonna Alanis, NP  Patient Care Team: Virgie Dad, MD as PCP - General (Internal Medicine) Buford Dresser, MD as PCP - Cardiology (Cardiology) Ricard Dillon, MD as Consulting Physician (Internal Medicine) Virgie Dad, MD (Internal Medicine)  Extended Emergency Contact Information Primary Emergency Contact: Gordon,[place] Address: [address]          [place], [place] [address] [name] of [place] Phone: [phone] Mobile Phone: [phone] Relation: Daughter Secondary Emergency Contact: [place] Mobile Phone: [phone] Relation: Daughter  Code Status:  DNR Goals of care: Advanced Directive information Advanced Directives 08/15/2021  Does Patient Have a Medical Advance Directive? Yes  Type of Paramedic of [place];Living will;Out of facility DNR (pink MOST or yellow form)  Does patient want to make changes to medical advance directive? No - Patient declined  Copy of [place] in Chart? Yes - validated most recent copy scanned in chart (See row information)  Pre-existing out of facility DNR order (yellow form or pink MOST form) Yellow form placed in chart (order not valid for inpatient use)     Chief Complaint  Patient presents with   Acute Visit    Shortness of breath    HPI:  Pt is a 88 y.o. male seen today for an acute visit for shortness of breath and chest pain.   He currently resides on the skilled nursing unit at [facility]. PMH: HTN, HLD, T2DM, brain stem infarct s/p TPA, atrial fib, PAD s/p bilateral AKA.   02/17 he noticed intermittent chest pain. During the weekend his sats dropped and he was requiring 3 liters oxygen, normally on RA. He refused to go the the ED over the weekend. This morning he reports chest pain is occurring more often. Describes chest pain as " discomfort,  rated 4/10. He also reports feeling more fatigued in the past 2 weeks. Recent labs: WBC 7.7, hgb 11.2, neutrophils 63.8, TSH 2.24 08/08/2021. Gabapentin recently held to help with fatigue. HR elevated this morning> 110, remains on metoprolol and Eliquis for afib. Treatment options discussed with patient, he agrees to go to ED.   Past Medical History:  Diagnosis Date   BPH (benign prostatic hyperplasia) 10/14/2014   Cervicalgia 01/04/2016   Chronic kidney disease    Contusion of left shoulder 05/27/14   DM type 2 (diabetes mellitus, type 2) ([place])    Dysphagia    High blood pressure    History of basal cell cancer 11/21/2005   Right temple   History of colonic polyps 10/14/2003   Hyperlipidemia    Left knee pain 05/27/14   Peripheral neuropathic pain    Peripheral vascular disease ([place])    SAH (subarachnoid hemorrhage) ([place]) 2009   TBI   Stroke [facility])    TIA (transient ischemic attack) 11/06/2005   Left eye pain. Slurred speech. Diaphoresis. No residual effects.    Past Surgical History:  Procedure Laterality Date   ABDOMINAL AORTOGRAM W/LOWER EXTREMITY N/A 06/12/2018   Procedure: ABDOMINAL AORTOGRAM W/LOWER EXTREMITY;  Surgeon: Marty Heck, MD;  Location: [facility];  Service: Cardiovascular;  Laterality: N/A;   Achilles tendon repair Left 1998   ruptured tendon   AMPUTATION Left 10/11/2018   Procedure: Left  AMPUTATION ABOVE KNEE;  Surgeon: Marty Heck, MD;  Location: [facility];  Service: Vascular;  Laterality: Left;   AMPUTATION Right 02/07/2019  Procedure: AMPUTATION ABOVE KNEE;  Surgeon: Rosetta Posner, MD;  Location: [facility];  Service: Vascular;  Laterality: Right;   COLONOSCOPY  1992   Polyp removed   COLONOSCOPY  1998   normal   ORIF FEMUR FRACTURE Right 1929   PERIPHERAL VASCULAR INTERVENTION Left 06/12/2018   Procedure: PERIPHERAL VASCULAR INTERVENTION;  Surgeon: Marty Heck, MD;  Location: [place] [facility];  Service: Cardiovascular;  Laterality:  Left;  Attempted    TONSILLECTOMY      Allergies  Allergen Reactions   Tape Other (See Comments)    TAPE WILL TEAR THE SKIN!!!!    Outpatient Encounter Medications as of 08/15/2021  Medication Sig   acetaminophen (TYLENOL) 650 MG CR tablet Take 650 mg by mouth every 8 (eight) hours as needed for pain.   apixaban (ELIQUIS) 2.5 MG TABS tablet Take 2.5 mg by mouth 2 (two) times daily. Morning: 7am-11am Evening: 7:30pm-11pm   bisacodyl (DULCOLAX) 10 MG suppository Place 10 mg rectally daily as needed for moderate constipation.   Calcium Carbonate-Vitamin D 600-400 MG-UNIT tablet Take 1 tablet by mouth daily. Morning: 7am-11am.   cholecalciferol (VITAMIN D) 25 MCG (1000 UNIT) tablet Take 1 tablet by mouth daily.   metFORMIN (GLUCOPHAGE) 500 MG tablet Take 500 mg by mouth 2 (two) times daily with a meal. Morning: 7am-9am Evening: 5pm-7pm.   methocarbamol (ROBAXIN) 500 MG tablet Take 500 mg by mouth every 8 (eight) hours as needed for muscle spasms.   metoprolol tartrate (LOPRESSOR) 25 MG tablet Take 12.5 mg by mouth 2 (two) times daily. Morning: 7am-11am Evening: 7pm-11pm   Propylene Glycol-Glycerin 1-0.3 % SOLN Place 2 drops into both eyes daily as needed (dry eyes).    sennosides-docusate sodium (SENOKOT-S) 8.6-50 MG tablet Take 2 tablets by mouth at bedtime as needed.    simvastatin (ZOCOR) 10 MG tablet Take 10 mg by mouth daily. Between the hours of 5pm-7pm.   vitamin B-12 (CYANOCOBALAMIN) 500 MCG tablet Take 500 mcg by mouth daily.   zinc oxide 20 % ointment Apply 1 application topically as needed for irritation. To buttocks after every incontinent episode and as needed for redness. May keep at bedside. As Needed   [DISCONTINUED] gabapentin (NEURONTIN) 100 MG capsule Take 100 mg by mouth 2 (two) times daily. 7:30pm-11pm. (Patient not taking: Reported on 08/15/2021)   No facility-administered encounter medications on file as of 08/15/2021.    Review of Systems  Constitutional:  Positive  for fatigue. Negative for activity change, appetite change, chills and diaphoresis.  HENT:  Negative for congestion and sore throat.   Respiratory:  Positive for shortness of breath. Negative for cough and wheezing.   Cardiovascular:  Positive for chest pain.  Gastrointestinal:  Negative for abdominal distention and abdominal pain.  Musculoskeletal:  Positive for gait problem.  Skin:  Positive for color change.  Neurological:  Positive for weakness. Negative for dizziness and headaches.  Psychiatric/Behavioral:  Negative for confusion and dysphoric mood. The patient is not nervous/anxious.    Immunization History  Administered Date(s) Administered   DTaP 12/31/2012   Influenza, High Dose Seasonal PF 04/04/2017, 04/10/2019   Influenza,inj,Quad PF,6+ Mos 03/28/2018   Influenza-Unspecified 04/10/2014, 03/25/2015, 04/06/2016, 03/30/2020, 04/20/2021   Moderna SARS-COV2 Booster Vaccination 05/10/2020, 12/01/2020   Moderna Sars-Covid-2 Vaccination 06/30/2019, 07/28/2019   PPD Test 04/29/2014   Pneumococcal Conjugate-13 04/28/2017   Pneumococcal-Unspecified 09/25/2003, 03/13/2011   Td 12/24/2012   Tdap 04/28/2017   Zoster Recombinat (Shingrix) 10/05/2017, 12/14/2017   Pertinent  Health Maintenance Due  Topic Date Due   OPHTHALMOLOGY EXAM  07/01/2020   HEMOGLOBIN A1C  10/03/2021   INFLUENZA VACCINE  Completed   FOOT EXAM  Discontinued   URINE MICROALBUMIN  Discontinued   Fall Risk 02/09/2019 02/09/2019 02/10/2019 10/31/2020 01/07/2021  Falls in the past year? - - - - 1  Was there an injury with Fall? - - - - 0  Fall Risk Category Calculator - - - - 2  Fall Risk Category - - - - Moderate  Patient Fall Risk Level High fall risk High fall risk High fall risk Moderate fall risk High fall risk  Patient at Risk for Falls Due to - - - - History of fall(s);Impaired balance/gait;Impaired mobility  Fall risk Follow up - - - - Falls evaluation completed;Education provided;Falls prevention discussed    Functional Status Survey:    Vitals:   08/15/21 0951  BP: (!) 161/83  Pulse: (!) 110  Resp: (!) 30  Temp: 98 F (36.7 C)  SpO2: (!) 86%  Weight: 105 lb (47.6 kg)  Height: 3\' 8"  (1.118 m)   Body mass index is 38.13 kg/m. Physical Exam Vitals reviewed.  Constitutional:      General: He is not in acute distress. HENT:     Head: Normocephalic.  Eyes:     General:        Right eye: No discharge.        Left eye: No discharge.  Neck:     Vascular: No carotid bruit.  Cardiovascular:     Rate and Rhythm: Tachycardia present. Rhythm irregular.     Pulses: Normal pulses.     Heart sounds: Murmur heard.  Pulmonary:     Effort: Pulmonary effort is normal. No respiratory distress.     Breath sounds: Examination of the right-lower field reveals decreased breath sounds. Examination of the left-lower field reveals decreased breath sounds. Decreased breath sounds present. No wheezing or rales.     Comments: 3 liters oxygen Abdominal:     General: Bowel sounds are normal. There is no distension.     Palpations: Abdomen is soft.     Tenderness: There is no abdominal tenderness.  Musculoskeletal:     Cervical back: Neck supple.     Comments: Bilateral AKA  Lymphadenopathy:     Cervical: No cervical adenopathy.  Skin:    General: Skin is warm.  Neurological:     General: No focal deficit present.     Mental Status: He is alert and oriented to person, place, and time.     Gait: Gait abnormal.     Comments: Power wheelchair  Psychiatric:        Mood and Affect: Mood normal.        Behavior: Behavior normal.    Labs reviewed: Recent Labs    03/03/21 0000 04/04/21 0000  NA 136* 137  K 4.4 4.3  CL 100 99  CO2 30* 31*  BUN 23* 23*  CREATININE 1.0 1.0  CALCIUM 9.1 9.0   Recent Labs    03/03/21 0000 04/04/21 0000  AST 15 11*  ALT 9* 9*  ALKPHOS 53 55  ALBUMIN 3.3* 3.0*   Recent Labs    11/04/20 0000 03/03/21 0000 04/04/21 0000 08/08/21 0000  WBC 4.8 6.7 9.6  7.7  NEUTROABS 2,390.00 4,308.00 6,787.00 4,913.00  HGB 12.2* 12.2* 11.0* 11.2*  HCT 37* 36* 34* 32*  PLT 320 365  --  424*   Lab Results  Component Value Date   TSH 1.72  04/04/2021   Lab Results  Component Value Date   HGBA1C 6.8 04/04/2021   Lab Results  Component Value Date   CHOL 130 03/03/2021   HDL 43 03/03/2021   LDLCALC 71 03/03/2021   TRIG 82 03/03/2021   CHOLHDL 2.6 08/29/2018    Significant Diagnostic Results in last 30 days:  No results found.  Assessment/Plan 1. Shortness of breath - requiring 3 liters oxygen x 3 days - lower lobes diminished - concerns for PNA or PE - send to ED for further evaluation  2. Chest pain, unspecified type - first noticed 02/17- becoming more frequent - see above    Family/ staff Communication: plan discussed with patient, son in law and nurse  Labs/tests ordered:  none

## 2021-08-15 NOTE — Assessment & Plan Note (Addendum)
-  AKI on CKD  stage 3a.  -Cr peak to 1,.4. Prior Ct 1.1.  Improved.

## 2021-08-15 NOTE — Assessment & Plan Note (Addendum)
Discontinue zocor, no significant benefit at this point.

## 2021-08-15 NOTE — Assessment & Plan Note (Addendum)
-  Most recent A1c was 6.8 -Continue with SSI.  -Holding metformin

## 2021-08-15 NOTE — Evaluation (Signed)
Clinical/Bedside Swallow Evaluation Patient Details  Name: Nathan Cook MRN: [id number] Date of Birth: 1921-07-22  Today's Date: 08/15/2021 Time: SLP Start Time (ACUTE ONLY): 1629 SLP Stop Time (ACUTE ONLY): 1700 SLP Time Calculation (min) (ACUTE ONLY): 31 min  Past Medical History:  Past Medical History:  Diagnosis Date   BPH (benign prostatic hyperplasia) 10/14/2014   Cervicalgia 01/04/2016   Chronic kidney disease    Contusion of left shoulder 05/27/14   DM type 2 (diabetes mellitus, type 2) ([place])    Dysphagia    High blood pressure    History of basal cell cancer 11/21/2005   Right temple   History of colonic polyps 10/14/2003   Hyperlipidemia    Left knee pain 05/27/14   Peripheral neuropathic pain    Peripheral vascular disease ([place])    SAH (subarachnoid hemorrhage) ([place]) 2009   TBI   Stroke [facility])    TIA (transient ischemic attack) 11/06/2005   Left eye pain. Slurred speech. Diaphoresis. No residual effects.    Past Surgical History:  Past Surgical History:  Procedure Laterality Date   ABDOMINAL AORTOGRAM W/LOWER EXTREMITY N/A 06/12/2018   Procedure: ABDOMINAL AORTOGRAM W/LOWER EXTREMITY;  Surgeon: Nathan Heck, MD;  Location: [place] [facility];  Service: Cardiovascular;  Laterality: N/A;   Achilles tendon repair Left 1998   ruptured tendon   AMPUTATION Left 10/11/2018   Procedure: Left  AMPUTATION ABOVE KNEE;  Surgeon: Nathan Heck, MD;  Location: [facility];  Service: Vascular;  Laterality: Left;   AMPUTATION Right 02/07/2019   Procedure: AMPUTATION ABOVE KNEE;  Surgeon: Nathan Posner, MD;  Location: [facility];  Service: Vascular;  Laterality: Right;   COLONOSCOPY  1992   Polyp removed   COLONOSCOPY  1998   normal   ORIF FEMUR FRACTURE Right 1929   PERIPHERAL VASCULAR INTERVENTION Left 06/12/2018   Procedure: PERIPHERAL VASCULAR INTERVENTION;  Surgeon: Nathan Heck, MD;  Location: [facility];  Service: Cardiovascular;  Laterality: Left;   Attempted    TONSILLECTOMY     HPI:  Nathan Cook is a 88 yo male who presented to ED from [facility] with 3 days of left sided chest pain in setting of worsening cough; signficant rales and new O2 requirement of 5L [place]. MD concerned for pna.  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 It was initially quite severe, improved over time, but function still waxes and wanes per pt and his daughter, Nathan Cook.  He endorses that he produces a lot of phlegm; has to carry a cup with him throughout the day.  CXR 2/20 shows large (>50%) pleural effusion with opacity, concerning for PNA. PMHx stroke, B/l AKA due to PAD.    Assessment / Plan / Recommendation  Clinical Impression  Pt presents with what he and his daughter, Nathan Cook, describe as baseline swallowing function. There are multiple sub-swallows per bolus, intermittent hard throat-clearing regardless of consistency (liquid and solid). He produces phlegm regularly and coughs/clears throat in an effort to expectorate. This occurs during meals and outside of meals.  We discussed the waxing/waning nature of swallowing, the potential for aspiration given his hx of stroke, the risk that aspiration may sometimes create an opportunity for pna, and that this situation may be one of those times.  Neither Nathan Cook nor his daughter are interested in modifying his liquids or diet. They verbalize understanding of the risks and wish to continue regular solids/thin liquids.  He enjoys having cheese and crackers and  a small glass of wine at [facility], and Nathan Cook asserted that her father should be free to eat what he wants, given his advanced years.  Recommend resuming a regular diet, thin liquids at their request; SLP will f/u briefly tomorrow to answer any further questions. SLP Visit Diagnosis: Dysphagia, oropharyngeal phase (R13.12)           Diet Recommendation   Regular solids, thin liquids  Medication Administration: Whole meds with puree     Other  Recommendations Oral Care Recommendations: Oral care BID    Recommendations for follow up therapy are one component of a multi-disciplinary discharge planning process, led by the attending physician.  Recommendations may be updated based on patient status, additional functional criteria and insurance authorization.  Follow up Recommendations No SLP follow up      Assistance Recommended at Discharge  Return to SNF      Frequency and Duration min 1 x/week  1 week               Swallow Study   General HPI: Nathan Cook is a 88 yo male who presented to ED from [facility] with 3 days of left sided chest pain in setting of worsening cough; signficant rales and new O2 requirement of 5L [place]. MD concerned for pna.  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 It was initially quite severe, improved over time, but function still waxes and wanes per pt and his daughter, Nathan Cook.  He endorses that he produces a lot of phlegm; has to carry a cup with him throughout the day.  CXR 2/20 shows large (>50%) pleural effusion with opacity, concerning for PNA. PMHx stroke, B/l AKA due to PAD. Type of Study: Bedside Swallow Evaluation Previous Swallow Assessment: 2019 Diet Prior to this Study: NPO Temperature Spikes Noted: No Respiratory Status: Nasal cannula History of Recent Intubation: No Behavior/Cognition: Alert;Cooperative;Pleasant mood Oral Cavity Assessment: Within Functional Limits Oral Care Completed by SLP: No Oral Cavity - Dentition: Adequate natural dentition Vision: Functional for self-feeding Self-Feeding Abilities: Needs assist Patient Positioning: Upright in bed Baseline Vocal Quality: Wet Volitional Cough: Strong Volitional Swallow: Able to elicit    Oral/Motor/Sensory Function Overall Oral Motor/Sensory Function: Mild impairment Facial ROM: Reduced right Facial Symmetry: Abnormal symmetry right;Suspected CN VII (facial) dysfunction Lingual Symmetry:  Within Functional Limits   Ice Chips Ice chips: Within functional limits   Thin Liquid Thin Liquid: Impaired Presentation: Straw Pharyngeal  Phase Impairments: Multiple swallows;Throat Clearing - Delayed    Nectar Thick Nectar Thick Liquid: Not tested   Honey Thick Honey Thick Liquid: Not tested   Puree Puree: Impaired Pharyngeal Phase Impairments: Multiple swallows   Solid     Solid: Impaired Pharyngeal Phase Impairments: Multiple swallows;Throat Clearing - Delayed      [name] 08/15/2021,6:11 PM Estill Bamberg L. Tivis Ringer, [place] Office number [phone] Pager [phone]

## 2021-08-15 NOTE — Assessment & Plan Note (Addendum)
-  Declines thoracentesis -DNR form is present at the bedside -plan to discharge home with hospice and antibiotics.

## 2021-08-15 NOTE — Assessment & Plan Note (Addendum)
-   Patient presented with new oxygen requirement, oxygen saturation 86 on room air.  Currently requiring 5 L of oxygen to keep sat 90%. -Likely secondary to left-sided pleural effusion and pneumonia. -He declined thoracentesis. -Continue with IV antibiotics, flutter valve, incentive spirometry. -Palliative care consulted for goals of care. Plan to continue with antibiotics,   at time discharge,  Hospice care at [facility].  Discussed with family, infection wont get better with just antibiotics, plan to discharge home with hospice today. He will be discharge on oral Augmentin.

## 2021-08-16 DIAGNOSIS — I129 Hypertensive chronic kidney disease with stage 1 through stage 4 chronic kidney disease, or unspecified chronic kidney disease: Secondary | ICD-10-CM

## 2021-08-16 DIAGNOSIS — Z7189 Other specified counseling: Secondary | ICD-10-CM

## 2021-08-16 DIAGNOSIS — N183 Chronic kidney disease, stage 3 unspecified: Secondary | ICD-10-CM

## 2021-08-16 DIAGNOSIS — Z8673 Personal history of transient ischemic attack (TIA), and cerebral infarction without residual deficits: Secondary | ICD-10-CM

## 2021-08-16 DIAGNOSIS — J189 Pneumonia, unspecified organism: Principal | ICD-10-CM

## 2021-08-16 DIAGNOSIS — J918 Pleural effusion in other conditions classified elsewhere: Secondary | ICD-10-CM

## 2021-08-16 DIAGNOSIS — J9601 Acute respiratory failure with hypoxia: Secondary | ICD-10-CM | POA: Diagnosis not present

## 2021-08-16 DIAGNOSIS — R1310 Dysphagia, unspecified: Secondary | ICD-10-CM

## 2021-08-16 LAB — CBC
HCT: 34.3 % — ABNORMAL LOW (ref 39.0–52.0)
Hemoglobin: 11.5 g/dL — ABNORMAL LOW (ref 13.0–17.0)
MCH: 33.6 pg (ref 26.0–34.0)
MCHC: 33.5 g/dL (ref 30.0–36.0)
MCV: 100.3 fL — ABNORMAL HIGH (ref 80.0–100.0)
Platelets: 454 10*3/uL — ABNORMAL HIGH (ref 150–400)
RBC: 3.42 MIL/uL — ABNORMAL LOW (ref 4.22–5.81)
RDW: 14.3 % (ref 11.5–15.5)
WBC: 22.8 10*3/uL — ABNORMAL HIGH (ref 4.0–10.5)
nRBC: 0 % (ref 0.0–0.2)

## 2021-08-16 LAB — BASIC METABOLIC PANEL
Anion gap: 9 (ref 5–15)
BUN: 54 mg/dL — ABNORMAL HIGH (ref 8–23)
CO2: 27 mmol/L (ref 22–32)
Calcium: 8.7 mg/dL — ABNORMAL LOW (ref 8.9–10.3)
Chloride: 101 mmol/L (ref 98–111)
Creatinine, Ser: 1.25 mg/dL — ABNORMAL HIGH (ref 0.61–1.24)
GFR, Estimated: 52 mL/min — ABNORMAL LOW (ref >60)
Glucose, Bld: 162 mg/dL — ABNORMAL HIGH (ref 70–99)
Potassium: 4.1 mmol/L (ref 3.5–5.1)
Sodium: 137 mmol/L (ref 135–145)

## 2021-08-16 LAB — GLUCOSE, CAPILLARY
Glucose-Capillary: 152 mg/dL — ABNORMAL HIGH (ref 70–99)
Glucose-Capillary: 146 mg/dL — ABNORMAL HIGH (ref 70–99)
Glucose-Capillary: 131 mg/dL — ABNORMAL HIGH (ref 70–99)
Glucose-Capillary: 223 mg/dL — ABNORMAL HIGH (ref 70–99)
Glucose-Capillary: 159 mg/dL — ABNORMAL HIGH (ref 70–99)

## 2021-08-16 MED ORDER — GUAIFENESIN ER 600 MG PO TB12
1200.0000 mg | ORAL_TABLET | Freq: Two times a day (BID) | ORAL | Status: DC
  Administered 2021-08-16 – 2021-08-20 (×9): 1200 mg via ORAL
  Filled 2021-08-16 (×9): qty 2

## 2021-08-16 NOTE — Progress Notes (Signed)
Speech Language Pathology Treatment: Dysphagia  Patient Details Name: Nathan Cook MRN: [id number] DOB: Nov 30, 1921 Today's Date: 08/16/2021 Time: 3244-0102 SLP Time Calculation (min) (ACUTE ONLY): 37 min  Assessment / Plan / Recommendation Clinical Impression  F/u after yesterday's clinical swallowing assessment.  Mr. Jaber continues to present with hard-throat clearing and cough with PO intake today. He sounds wetter and more congested at level of larynx. We talked again about current circumstances, particularly that he has likely dealt with aspiration for several years and that his lungs may have a more difficult time tolerating aspiration now.  We decided to proceed with an MBS tomorrow to gather more information and assess the physiology of his swallowing.  Results may equip him to make informed decisions about his diet moving forward. Mr. Stash and his daughter verbalize understanding and agree with plan.    Continue current diet tonight.  Will schedule exam for next date. D/W RN.    HPI HPI: Nathan Cook is a 88 yo male who presented to ED from [facility] with 3 days of left sided chest pain in setting of worsening cough; signficant rales and new O2 requirement of 5L . MD concerned for pna.  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 It was initially quite severe, improved over time, but function still waxes and wanes per pt and his daughter, Donia Guiles.  He endorses that he produces a lot of phlegm; has to carry a cup with him throughout the day.  CXR 2/20 shows large (>50%) pleural effusion with opacity, concerning for PNA. PMHx stroke, B/l AKA due to PAD.      SLP Plan  MBS      Recommendations for follow up therapy are one component of a multi-disciplinary discharge planning process, led by the attending physician.  Recommendations may be updated based on patient status, additional functional criteria and insurance authorization.    Recommendations   Diet recommendations: Regular;Thin liquid Liquids provided via: Straw;Cup Medication Administration: Whole meds with puree Supervision: Staff to assist with self feeding                Oral Care Recommendations: Oral care BID Follow Up Recommendations: Other (comment) (tba) SLP Visit Diagnosis: Dysphagia, oropharyngeal phase (R13.12) Plan: MBS         Louis Ivery L. Tivis Ringer, [place] Office number [phone] Pager [phone]   Assunta Curtis  08/16/2021, 2:23 PM

## 2021-08-16 NOTE — Progress Notes (Signed)
Progress Note   Patient: Nathan Cook JEH:[id number] DOB: 03/22/1922 DOA: 08/15/2021     1 DOS: the patient was seen and examined on 08/16/2021   Brief hospital course: 88 year old past medical history significant for BPH, CKD, diabetes, hypertension, A-fib on Eliquis, PVD with history of bilateral AKA, CVA presents complaining of chest pain.  He has been feeling poorly.  He was sent to the ED for further evaluation.  He was found to be hypoxic,, worsening cough he require 5 L of oxygen in the ED to keep oxygen saturation 92%.  White blood cell 26, lactate 2.6, x-ray new moderate to large left pleural effusion with associated left lung airspace opacities, possible atelectasis versus pneumonia.  Patient admitted with acute hypoxic respiratory failure secondary to pneumonia and left large pleural effusion  Assessment and Plan: * Acute respiratory failure with hypoxia ([facility])- (present on admission) - Patient presented with new oxygen requirement, oxygen saturation 96 on room air.  Currently requiring 5 L of oxygen to keep sat 90%. -Likely secondary to left-sided pleural effusion and pneumonia. -He declined thoracentesis. -Continue with IV antibiotics, flutter valve, incentive spirometry. -Palliative care consulted for goals of care.  Goals of care, counseling/discussion -Declines thoracentesis -DNR form is present at the bedside -Wants to treat the treatable - within reason  CAP (community acquired pneumonia)- (present on admission) -Patient presented with hypoxia, worsening cough, chest x-ray; new moderate to large left pleural effusion and associated left lung airspace opacity. -Prior history of aspiration. Speech therapy following, planning MBS.  -Influenza negative. -COVID-19 negative. -Schedule Mucinex -Flutter valve incentive spirometry -Declined thoracentesis -Continue with IV ceftriaxone and azithromycin -Palliative  care consulted for goals of care  Pleural effusion  associated with pulmonary infection- (present on admission) -Moderate to large pleural effusion, likely parapneumonic -Decline paracentesis.   S/P AKA (above knee amputation) bilateral (HCC) -B AKAs -Appear to be healing well -He is usually able to transfer well but is currently too weak -PT/OT consulted.   History of CVA (cerebrovascular accident) -Has residual RUE deficits  -On eliquis   Dysphagia -Patient with h/o dysphagia -Evaluated by speech recommend Regular thin liquid diet. Plan for MBS.    Paroxysmal atrial fibrillation ([place])- (present on admission) -Rate controlled with Lopressor -Continue Eliquis.   CKD (chronic kidney disease) stage 3, GFR 30-59 ml/min (HCC)- (present on admission) -AKI on CKD  stage 3a.  -Cr peak to 1,.4. Prior Ct 1.1.  Improved.   Controlled type 2 diabetes mellitus with stage 3 chronic kidney disease, without long-term current use of insulin ([place])- (present on admission) -Most recent A1c was 6.8 -Continue with SSI.  -Holding metformin while inpatient.    Benign hypertension with chronic kidney disease, stage III ([place])- (present on admission) -Continue Lopressor -Continue with as needed hydralazine.  Hyperlipidemia LDL goal <70- (present on admission) -Continue Zocor for now.         Subjective:  He report thick secretion, cough/  Decline thoracentesis.    Physical Exam: Vitals:   08/15/21 1840 08/15/21 2258 08/16/21 0425 08/16/21 0915  BP: (!) 122/57 (!) 141/56 (!) 124/59 121/60  Pulse: 72 93 88 82  Resp: 17 16 18 18   Temp: 98.3 F (36.8 C) 98.7 F (37.1 C) 98.4 F (36.9 C) 98 F (36.7 C)  TempSrc:    Oral  SpO2: 95% (!) 88% 91% 91%  Weight:      Height:       General; NAD, on 5 L oxygen [place].  Lungs; BL crackles.  CVS;  S 1, S 2 RRR   Data Reviewed:  Cbc, bmet and x ray reviewed.   Family Communication: No family at bedside.   Disposition: Status is: Inpatient Remains inpatient appropriate because:  Treatment of PNA           Planned Discharge Destination: Home     Time spent: 45 minutes  Author: Elmarie Shiley, MD 08/16/2021 3:11 PM  For on call review [email or web address].

## 2021-08-16 NOTE — TOC Progression Note (Addendum)
Transition of Care [facility]) - Initial/Assessment Note    Patient Details  Name: Nathan Cook MRN: [id number] Date of Birth: 24-Aug-1921  Transition of Care [facility]) CM/SW Contact:    Milinda Antis, [place] Phone Number: 08/16/2021, 12:46 PM  Clinical Narrative:                 CSW contacted [place] with [facility] to inquire about whether the patient is from the ALF of SNF.  There was no answer.  CSW left a secure VM requesting a returned call.  16:35-  CSW received a returned call from [place] with [facility].  The patient is a resident there and he can return when medically ready.  The facility works with [facility] and this agency will evaluate the patient upon his return.        Patient Goals and CMS Choice        Expected Discharge Plan and Services                                                Prior Living Arrangements/Services                       Activities of Daily Living      Permission Sought/Granted                  Emotional Assessment              Admission diagnosis:  Acute respiratory failure with hypoxia ([place]) [J96.01] Community acquired pneumonia of left lower lobe of lung [J18.9] Patient Active Problem List   Diagnosis Date Noted   Acute respiratory failure with hypoxia ([place]) 08/15/2021   Pleural effusion associated with pulmonary infection 08/15/2021   CAP (community acquired pneumonia) 08/15/2021   Goals of care, counseling/discussion 08/15/2021   Fatigue 08/12/2021   Microalbuminuria due to type 2 diabetes mellitus ([place]) 09/15/2019   Constipation 05/16/2019   Fall 03/04/2019   S/P AKA (above knee amputation) bilateral ([place]) 02/07/2019   Hyponatremia 01/24/2019   History of CVA (cerebrovascular accident) 01/16/2019   Hypotension 12/14/2018   Weight loss 10/23/2018   Blood loss anemia 10/21/2018   Neuropathic pain 10/21/2018   S/P AKA (above knee amputation) unilateral, left  ([place]) 10/15/2018   Critical lower limb ischemia ([place]) 10/09/2018   History of stroke with residual deficit 08/16/2018   Dysphagia 04/12/2018   Paroxysmal atrial fibrillation ([place])    Stroke ([place]) 04/01/2018   PAD (peripheral artery disease) ([place]) 03/20/2018   Generalized osteoarthritis 06/27/2017   B12 deficiency 06/27/2017   History of TIA (transient ischemic attack) 03/21/2017   Vitamin D deficiency 03/21/2017   Dry eyes, bilateral 03/21/2017   CKD (chronic kidney disease) stage 3, GFR 30-59 ml/min ([place]) 03/21/2017   Cervicalgia 01/04/2016   Pain in joint, shoulder region 05/11/2015   BPH (benign prostatic hyperplasia) 10/14/2014   Hyperlipidemia LDL goal <70    Benign hypertension with chronic kidney disease, stage III ([place])    Controlled type 2 diabetes mellitus with stage 3 chronic kidney disease, without long-term current use of insulin ([place])    Left knee pain 05/27/2014   Brainstem infarct, acute ([place]) s/p IV tPA 11/06/2005   History of colonic polyps 10/14/2003   PCP:  Virgie Dad, MD Pharmacy:   [facility] [id number] -  [place], [place] [address] [place] [address] Phone: [phone] Fax: [phone]     Social Determinants of Health (SDOH) Interventions    Readmission Risk Interventions No flowsheet data found.

## 2021-08-16 NOTE — Hospital Course (Addendum)
87 year old past medical history significant for BPH, CKD, diabetes, hypertension, A-fib on Eliquis, PVD with history of bilateral AKA, CVA presents complaining of chest pain.  He has been feeling poorly.  He was sent to the ED for further evaluation.  He was found to be hypoxic,, worsening cough he require 5 L of oxygen in the ED to keep oxygen saturation 92%.  White blood cell 26, lactate 2.6, x-ray new moderate to large left pleural effusion with associated left lung airspace opacities, possible atelectasis versus pneumonia.  Patient admitted with acute hypoxic respiratory failure secondary to pneumonia and left large pleural effusion.  Plan to continue with oral antibiotics at discharge and transition to home with Hospice.

## 2021-08-16 NOTE — Evaluation (Signed)
Occupational Therapy Evaluation Patient Details Name: Nathan Cook MRN: [id number] DOB: Oct 13, 1921 Today's Date: 08/16/2021   History of Present Illness Nathan Cook is a 88 y.o. male admitted 08/15/21 with chest pain, felt poorly all last week, hypoxia, weakness, cough. Dx with Acute respiratory failure with hypoxia, PE. PMH includes BPH; CKD: DM; HTN; HLD; afib on Eliquis; PVD with B AKA; and CVA RUE deficits as well as speech pattern dysfunction   Clinical Impression   Pt from [facility]. Typically gets assist with sponge bath 1x/wk, gets in his power WC with min staff assist, and then likes to visit wife who lives down the hall. Today Pt did an excellent job directing therapy team with assist. He was overall mod (+2 safety for initial eval) to perform AP transfer from bed to recliner. He is set up for UB ADL (mod A for bathing) and will benefit from skilled OT in the acute setting prior to dc back to ALF with HHOT to maximize safety and independence in ADL/transfers. Pt also now requiring 5L O2 to maintain SpO2 >90% at this time. OT will continue to follow acutely.      Recommendations for follow up therapy are one component of a multi-disciplinary discharge planning process, led by the attending physician.  Recommendations may be updated based on patient status, additional functional criteria and insurance authorization.   Follow Up Recommendations  Home health OT (at ALF)    Assistance Recommended at Discharge Intermittent Supervision/Assistance  Patient can return home with the following A lot of help with walking and/or transfers;A lot of help with bathing/dressing/bathroom    Functional Status Assessment  Patient has had a recent decline in their functional status and demonstrates the ability to make significant improvements in function in a reasonable and predictable amount of time.  Equipment Recommendations  None recommended by OT (defer to ALF)     Recommendations for Other Services PT consult     Precautions / Restrictions Precautions Precautions: Fall Precaution Comments: Fall risk present but minimal Restrictions Weight Bearing Restrictions: No      Mobility Bed Mobility Overal bed mobility: Needs Assistance Bed Mobility: Supine to Sit     Supine to sit: Mod assist     General bed mobility comments: Mod handheld assist to pull from semi-supine to sit; handheld assist given from directly in front of pt (PT at foot of bed); Pt directed care well    Transfers Overall transfer level: Needs assistance Equipment used:  (bed pad for sliding) Transfers: Bed to chair/wheelchair/BSC         Anterior-Posterior transfers: Mod assist, +2 safety/equipment   General transfer comment: Light mod assist with use of the bed pad for scooting; Able to turn and back into recliner; Assist for scooting until able to reach back and use armrests for scooting; Able to clearly direct his care/assist; min assist to guard for loss of balance anteriorly      Balance Overall balance assessment: Needs assistance   Sitting balance-Leahy Scale: Fair (approaching Good) Sitting balance - Comments: Needed minguard assist for anterior loss of balance while scooting posteriorly                                   ADL either performed or assessed with clinical judgement   ADL Overall ADL's : Needs assistance/impaired Eating/Feeding: Set up;Sitting   Grooming: Wash/dry hands;Wash/dry face;Set up;Sitting;Bed level   Upper Body  Bathing: Moderate assistance Upper Body Bathing Details (indicate cue type and reason): for back Lower Body Bathing: Moderate assistance;Sitting/lateral leans Lower Body Bathing Details (indicate cue type and reason): gets assist at baseline Upper Body Dressing : Minimal assistance       Toilet Transfer: Minimal assistance;+2 for physical assistance;+2 for safety/equipment Toilet Transfer Details  (indicate cue type and reason): posterior transfer Toileting- Clothing Manipulation and Hygiene: Set up;Sitting/lateral lean       Functional mobility during ADLs: Minimal assistance;+2 for physical assistance;+2 for safety/equipment (A/P transfer to recliner)       Vision Baseline Vision/History: 1 Wears glasses Ability to See in Adequate Light: 0 Adequate Patient Visual Report: No change from baseline       Perception     Praxis      Pertinent Vitals/Pain Pain Assessment Pain Assessment: No/denies pain     Hand Dominance Right   Extremity/Trunk Assessment Upper Extremity Assessment Upper Extremity Assessment: Overall WFL for tasks assessed   Lower Extremity Assessment Lower Extremity Assessment: Defer to PT evaluation   Cervical / Trunk Assessment Cervical / Trunk Assessment: Kyphotic (with upper trunk stiffness)   Communication Communication Communication: Expressive difficulties (baseline from previous CVA)   Cognition Arousal/Alertness: Awake/alert Behavior During Therapy: WFL for tasks assessed/performed Overall Cognitive Status: Within Functional Limits for tasks assessed                                       General Comments  Notable desaturation with activity on room air; required at least 5L O2 to maintain SpO2>90%    Exercises     Shoulder Instructions      Home Living Family/patient expects to be discharged to:: Assisted living ([facility] at [facility])                             Home Equipment: Wheelchair - power   Additional Comments: Uses power wc for mobility      Prior Functioning/Environment Prior Level of Function : Needs assist             Mobility Comments: power chair for mobility, gets to dining hall, and visits his wife on the same hall) twice a day ADLs Comments: Assist for sponge bathing1 x a week        OT Problem List: Decreased strength;Decreased activity tolerance;Impaired balance  (sitting and/or standing)      OT Treatment/Interventions: Self-care/ADL training;Therapeutic exercise;Energy conservation;Therapeutic activities;Patient/family education;Balance training    OT Goals(Current goals can be found in the care plan section) Acute Rehab OT Goals Patient Stated Goal: get back to [place] so he can see wife OT Goal Formulation: With patient Time For Goal Achievement: 08/30/21 Potential to Achieve Goals: Good ADL Goals Pt Will Perform Grooming: with set-up;sitting Pt Will Perform Upper Body Dressing: with modified independence;sitting Pt Will Perform Lower Body Dressing: with modified independence;bed level Pt Will Transfer to Toilet: with min guard assist;anterior/posterior transfer Pt Will Perform Toileting - Clothing Manipulation and hygiene: with min guard assist;sitting/lateral leans Additional ADL Goal #1: Pt will perform bed mobility at min A prior to engaging in ADL  OT Frequency: Min 2X/week    Co-evaluation PT/OT/SLP Co-Evaluation/Treatment: Yes Reason for Co-Treatment: For patient/therapist safety;To address functional/ADL transfers;Other (comment) (activity tolerance) PT goals addressed during session: Mobility/safety with mobility;Balance;Strengthening/ROM OT goals addressed during session: ADL's and self-care;Strengthening/ROM;Other (comment) (activity tolerance)  AM-PAC OT "6 Clicks" Daily Activity     Outcome Measure Help from another person eating meals?: None Help from another person taking care of personal grooming?: A Little Help from another person toileting, which includes using toliet, bedpan, or urinal?: A Lot Help from another person bathing (including washing, rinsing, drying)?: A Lot Help from another person to put on and taking off regular upper body clothing?: A Little Help from another person to put on and taking off regular lower body clothing?: A Lot 6 Click Score: 16   End of Session Equipment Utilized During  Treatment: Oxygen (5L) Nurse Communication: Mobility status;Precautions  Activity Tolerance: Patient tolerated treatment well Patient left: in chair;with chair alarm set;with call bell/phone within reach  OT Visit Diagnosis: Muscle weakness (generalized) (M62.81)                Time: 7588-3254 OT Time Calculation (min): 37 min Charges:  OT General Charges $OT Visit: 1 Visit OT Evaluation $OT Eval Moderate Complexity: [place] OTR/L Acute Rehabilitation Services Pager: [phone] Office: [place] 08/16/2021, 1:15 PM

## 2021-08-16 NOTE — Progress Notes (Signed)
Physical Therapy Note  (Full PT eval to follow)  SATURATION QUALIFICATIONS: (This note is used to comply with regulatory documentation for home oxygen)  Patient Saturations on Room Air at Rest = 88%  Patient Saturations on [employer] while participating in functional activities = 83%  Patient Saturations on 5 Liters of oxygen while participating in functional activities = 93%  Please briefly explain why patient needs home oxygen: Patient requires supplemental oxygen to maintain oxygen saturations at acceptable, safe levels with physical activity at this time.  Roney [place], [place]  Acute Rehabilitation Services Pager [phone] Office [phone]

## 2021-08-16 NOTE — Consult Note (Signed)
Consultation Note Date: 08/16/2021   Patient Name: Nathan Cook  DOB: Mar 20, 1922  MRN: [id number]  Age / Sex: 88 y.o., male  PCP: Nathan Dad, MD Referring Physician: Elmarie Shiley, MD  Reason for Consultation: Establishing goals of care  HPI/Patient Profile: 88 y.o. male  with past medical history of BPH, CKD, diabetes, hypertension, A-fib on Eliquis, PVD with history of bilateral AKA, CVA admitted on 08/15/2021 with chest pain and left-sided abdominal pain.  In the ED he was found hypoxic and admitted with acute respiratory failure secondary to pneumonia and left large pleural effusion. He has declined thoracentesis. Palliative care has been consulted to assist with goals of care conversation.  Clinical Assessment and Goals of Care:  I have reviewed medical records including EPIC notes, labs and imaging, assessed the patient and then met at the bedside along with his daughter [facility] Cook to discuss diagnosis prognosis, GOC, EOL wishes, disposition and options.  I introduced Palliative Medicine as specialized medical care for people living with serious illness. It focuses on providing relief from the symptoms and stress of a serious illness. The goal is to improve quality of life for both the patient and the family.  I attempted to elicit values and goals of care important to the patient.    Medical History Review and Understanding: Nathan Cook and his daughter share that his health has gradually declined since his stroke occurred 3 years ago. He has also had chronic issues with phlegm and dysphagia, and they understand this may be a recurrent issue for him in the future. We discussed his large pleural effusion and PNA, causing decreased energy and appetite.   Social History: Nathan Cook is married to his wife of 64 years, who has dementia and also lives in [facility] facility (down the hall).  He is originally from [place] and worked in Press photographer, Dance movement psychotherapist for [employer]. He previously enjoyed skiing and boating, singing in the choir. He currently enjoys painting, playing scrabble, and visiting his friends in the facility. He has four children who are all supportive; daughter Nathan Cook lives locally. 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  Functional and Nutritional State: Before this illness he was able to transfer to his wheelchair on his own. His nutritional status has been declining for some time due to constant phlegm and congenital condition. He is feeling optimistic today, as he has been able to transfer to the bedside chair twice.    Palliative Symptoms: Fatigue, dysphagia, weakness, excessive secretions  Advance Directives: A detailed discussion regarding advanced directives was had. HCPOA on file is current.  Code Status: Concepts specific to code status, artifical feeding and hydration, and rehospitalization were considered and discussed. Nathan Cook is clear that he is a DNR and he would never want a feeding tube.  Discussion: When asked to describe a good outcome, patient and his daughter state "a peaceful death." They understand that anything can happen at any time and he has been visited by a priest  for the anointing of the sick yesterday. Nathan Cook has all arrangements for his burial completed and feels he has had a good long life. He is at peace with mortality. He does not want invasive procedures or treatments, however he would like to continue management of his chronic conditions and improve from this acute illness with medications. He is interested in additional studies such as MBS before discussing options such as palliative care vs hospice. It is important for him to return to [facility] where he feels most comfortable. They feel he is well-supported and cared for there. We reviewed possible paths including comfort-focused care and comfort feeds. Discussed risk of  further decline and whether he would desire to return to the hospital for repeated aspiration PNAs. Nathan Cook and his daughter verbalize their understanding and agree to discuss again in the coming days.    The difference between aggressive medical intervention and comfort care was considered in light of the patient's goals of care. Hospice and Palliative Care services outpatient were explained and offered.   Discussed the importance of continued conversation with family and the medical providers regarding overall plan of care and treatment options, ensuring decisions are within the context of the patients values and GOCs.   Questions and concerns were addressed.  Hard Choices booklet left for review. The family was encouraged to call with questions or concerns.  PMT will continue to support holistically.   PATIENT is primary Media planner. HCPOA on file (his wife has dementia, however his four children are also all named).  SUMMARY OF RECOMMENDATIONS   -DNR confirmed -Patient wishes to proceed with life-prolonging medications for now but avoid thoracentesis, invasive tests or procedures -Goal is to return to his recent quality of life and discharge to [facility] -Ongoing discussion of options including hospice and comfort-focused care  -PMT will continue to follow. Patient and family are aware that I return to service on 2/23. Please call team line or secure chat with urgent needs  Prognosis:  Guarded  Discharge Planning: To Be Determined      Primary Diagnoses: Present on Admission:  Acute respiratory failure with hypoxia (HCC)  Paroxysmal atrial fibrillation (HCC)  Hyperlipidemia LDL goal <70  Controlled type 2 diabetes mellitus with stage 3 chronic kidney disease, without long-term current use of insulin (HCC)  CKD (chronic kidney disease) stage 3, GFR 30-59 ml/min (HCC)  Benign hypertension with chronic kidney disease, stage III (HCC)  Pleural effusion associated with pulmonary  infection  CAP (community acquired pneumonia)   I have reviewed the medical record, interviewed the patient and family, and examined the patient. The following aspects are pertinent.  Past Medical History:  Diagnosis Date   BPH (benign prostatic hyperplasia) 10/14/2014   Cervicalgia 01/04/2016   Chronic kidney disease    Contusion of left shoulder 05/27/14   DM type 2 (diabetes mellitus, type 2) ([place])    Dysphagia    High blood pressure    History of basal cell cancer 11/21/2005   Right temple   History of colonic polyps 10/14/2003   Hyperlipidemia    Left knee pain 05/27/14   Peripheral neuropathic pain    Peripheral vascular disease ([place])    SAH (subarachnoid hemorrhage) ([place]) 2009   TBI   Stroke [facility])    TIA (transient ischemic attack) 11/06/2005   Left eye pain. Slurred speech. Diaphoresis. No residual effects.    Social History   Socioeconomic History   Marital status: Married    Spouse name: Not on  file   Number of children: Not on file   Years of education: Not on file   Highest education level: Not on file  Occupational History   Occupation: retired Hotel manager   Tobacco Use   Smoking status: Former    Types: Cigarettes    Quit date: 06/26/1971    Years since quitting: 50.1   Smokeless tobacco: Never   Tobacco comments:    2 1/2 packs a day, until 1972  Vaping Use   Vaping Use: Never used  Substance and Sexual Activity   Alcohol use: Not Currently    Alcohol/week: 1.0 - 2.0 standard drink    Types: 1 - 2 Glasses of wine per week    Comment: Wine nightly   Drug use: No   Sexual activity: Not on file  Other Topics Concern   Not on file  Social History Narrative   Patient lives at [facility] since [facility]   Caffeine- Coffee   Married- Yes, 1951   [employer] 1943-46   House- Apartment with 2 people   Pets- No   Current/past profession- Press photographer   Exercise- Yes, walking   Living will- Yes   DNR- Yes   POA/HPOA- Yes      Social Determinants of Health    Financial Resource Strain: Low Risk    Difficulty of Paying Living Expenses: Not hard at all  Food Insecurity: No Food Insecurity   Worried About Charity fundraiser in the Last Year: Never true   [place] in the Last Year: Never true  Transportation Needs: No Transportation Needs   Lack of Transportation (Medical): No   Lack of Transportation (Non-Medical): No  Physical Activity: Insufficiently Active   Days of Exercise per Week: 7 days   Minutes of Exercise per Session: 20 min  Stress: No Stress Concern Present   Feeling of Stress : Not at all  Social Connections: Moderately Isolated   Frequency of Communication with Friends and Family: Three times a week   Frequency of Social Gatherings with Friends and Family: Three times a week   Attends Religious Services: Never   Active Member of Clubs or Organizations: No   Attends Music therapist: Never   Marital Status: Married   Family History  Problem Relation Age of Onset   Heart attack Mother    Heart disease Mother    Heart disease Father    Scheduled Meds:  apixaban  2.5 mg Oral BID   docusate sodium  100 mg Oral BID   gabapentin  100 mg Oral BID   guaiFENesin  1,200 mg Oral BID   insulin aspart  0-9 Units Subcutaneous TID WC   metoprolol tartrate  12.5 mg Oral BID   simvastatin  10 mg Oral q1800   sodium chloride flush  3 mL Intravenous Q12H   Continuous Infusions:  azithromycin 500 mg (08/16/21 1248)   cefTRIAXone (ROCEPHIN)  IV 1 g (08/16/21 1122)   PRN Meds:.acetaminophen **OR** acetaminophen, albuterol, bisacodyl, hydrALAZINE, methocarbamol, ondansetron **OR** ondansetron (ZOFRAN) IV, oxyCODONE, polyethylene glycol, polyvinyl alcohol Medications Prior to Admission:  Prior to Admission medications   Medication Sig Start Date End Date Taking? Authorizing Provider  acetaminophen (TYLENOL) 650 MG CR tablet Take 650 mg by mouth every 8 (eight) hours as needed for pain. Do not exceed 3,048m in 24  hours. 02/13/19  Yes [provider]  apixaban (ELIQUIS) 2.5 MG TABS tablet Take 2.5 mg by mouth 2 (two) times daily.  Morning: 7am-11am Evening: 7:30pm-11pm   Yes [provider]  bisacodyl (DULCOLAX) 10 MG suppository Place 10 mg rectally daily as needed for moderate constipation.   Yes [provider]  Calcium Carbonate-Vitamin D 600-400 MG-UNIT tablet Take 1 tablet by mouth daily. Morning: 7am-11am.   Yes [provider]  cholecalciferol (VITAMIN D) 25 MCG (1000 UNIT) tablet Take 1 tablet by mouth daily.   Yes [provider]  gabapentin (NEURONTIN) 100 MG capsule Take 100 mg by mouth 2 (two) times daily.   Yes [provider]  Infant Care Products (BABY SHAMPOO) SHAM Place 1 application into both eyes See admin instructions. Wash eyes with baby shampoo; resident requests doing it himself and in the am. Uses QD.   Yes [provider]  metFORMIN (GLUCOPHAGE) 500 MG tablet Take 500 mg by mouth 2 (two) times daily with a meal. Morning: 7am-9am Evening: 5pm-7pm.   Yes [provider]  methocarbamol (ROBAXIN) 500 MG tablet Take 500 mg by mouth every 8 (eight) hours as needed for muscle spasms.   Yes [provider]  metoprolol tartrate (LOPRESSOR) 25 MG tablet Take 12.5 mg by mouth 2 (two) times daily. Morning: 7am-11am Evening: 7pm-11pm   Yes [provider]  OVER THE COUNTER MEDICATION Take 4-6 oz by mouth daily as needed (per resident's request). MAR states that patient is to receive 4-6 ounces of wine upon request qd prn. Does not state whether they prefer red, white rose, etc...   Yes [provider]  OVER THE COUNTER MEDICATION Take 8 fluid ounces by mouth in the morning. Patient drinks fortified orange juice with breakfast every morning per SNF MAR.   Yes [provider]  Propylene Glycol-Glycerin 1-0.3 % SOLN Place 2 drops into both eyes daily as needed (dry eyes).    Yes [provider]  sennosides-docusate sodium (SENOKOT-S) 8.6-50 MG tablet Take 2 tablets by mouth at bedtime as needed.    Yes [provider]  simvastatin (ZOCOR) 10 MG tablet Take 10 mg by mouth daily. Between the hours of 5pm-7pm.   Yes [provider]  vitamin B-12 (CYANOCOBALAMIN) 500 MCG tablet Take 500 mcg by mouth daily.   Yes [provider]  zinc oxide 20 % ointment Apply 1 application topically as needed for irritation. To buttocks after every incontinent episode and as needed for redness. May keep at bedside. As Needed   Yes [provider]   Allergies  Allergen Reactions   Tape Other (See Comments)    TAPE WILL TEAR THE SKIN!!!!   Review of Systems  Constitutional:  Positive for activity change, appetite change and fatigue.  Respiratory:  Positive for cough.   Neurological:  Positive for weakness.  All other systems reviewed and are negative.  Physical Exam Vitals and nursing note reviewed.  Constitutional:      General: He is not in acute distress.    Appearance: He is ill-appearing.     Interventions: Nasal cannula in place.     Comments: 5L  Cardiovascular:     Rate and Rhythm: Normal rate.  Pulmonary:     Effort: Pulmonary effort is normal. No accessory muscle usage or respiratory distress.  Neurological:     Mental Status: He is alert and oriented to person, place, and time.  Psychiatric:        Mood and Affect: Mood normal.    Vital Signs: BP 121/60 (BP Location: Right Arm)    Pulse 82  Temp 98 F (36.7 C) (Oral)    Resp 18    Ht 3' 8" (1.118 m)    Wt 47 kg    SpO2 91%    BMI 37.63 kg/m  Pain Scale: 0-10   Pain Score: 5    SpO2: SpO2: 91 % O2 Device:SpO2: 91 % O2 Flow Rate: .O2 Flow Rate (L/min): 5 L/min  IO: Intake/output summary:  Intake/Output Summary (Last 24 hours) at 08/16/2021 1529 Last data filed at 08/16/2021 1500 Gross per 24 hour  Intake 1822.5 ml  Output 750 ml  Net 1072.5 ml    LBM: Last BM Date  : 08/15/21 (per pt) Baseline Weight: Weight: 47 kg Most recent weight: Weight: 47 kg     Palliative Assessment/Data: 40% at best     Total time: I spent 75 minutes in the care of the patient today in the above activities and documenting the encounter.  MDM: High  Johnell Comings Palliative Medicine Team Team phone # [phone]  Thank you for allowing the Palliative Medicine Team to assist in the care of this patient. Please utilize secure chat with additional questions, if there is no response within 30 minutes please call the above phone number.  Palliative Medicine Team providers are available by phone from 7am to 7pm daily and can be reached through the team cell phone.  Should this patient require assistance outside of these hours, please call the patient's attending physician.

## 2021-08-16 NOTE — Evaluation (Signed)
Physical Therapy Evaluation Patient Details Name: Nathan Cook MRN: [id number] DOB: Aug 10, 1921 Today's Date: 08/16/2021  History of Present Illness  Nathan Cook is a 88 y.o. male admitted 08/15/21 with chest pain, felt poorly all last week, hypoxia, weakness, cough. Dx with Acute respiratory failure with hypoxia, PE. PMH includes BPH; CKD: DM; HTN; HLD; afib on Eliquis; PVD with B AKA; and CVA RUE deficits as well as speech pattern dysfunction  Clinical Impression   Pt admitted with above diagnosis. Lives at [facility]; Prior to admission, pt was able to transfer to his power chair independently, spends time with his wife (on the same hallway) daily; Does not need supplemental O2 at baseline; Presents to PT with generalized weakness, decr functional capacity, with noted O2 desaturation during functional activity on room air;  Pt currently with functional limitations due to the deficits listed below (see PT Problem List). Pt will benefit from skilled PT to increase their independence and safety with mobility to allow discharge to the venue listed below.          Recommendations for follow up therapy are one component of a multi-disciplinary discharge planning process, led by the attending physician.  Recommendations may be updated based on patient status, additional functional criteria and insurance authorization.  Follow Up Recommendations Home health PT (at pt's apt/room)    Assistance Recommended at Discharge Set up Supervision/Assistance  Patient can return home with the following  A little help with bathing/dressing/bathroom    Equipment Recommendations    Recommendations for Other Services       Functional Status Assessment Patient has had a recent decline in their functional status and demonstrates the ability to make significant improvements in function in a reasonable and predictable amount of time.     Precautions / Restrictions Precautions Precautions:  Fall Precaution Comments: Fall risk present but minimal Restrictions Weight Bearing Restrictions: No      Mobility  Bed Mobility Overal bed mobility: Needs Assistance Bed Mobility: Supine to Sit     Supine to sit: Mod assist     General bed mobility comments: Mod handheld assist to pull from semi-supine to sit; handheld assist given from directly in front of pt (PT at foot of bed); Pt directed care well    Transfers Overall transfer level: Needs assistance Equipment used:  (bed pad for sliding) Transfers: Bed to chair/wheelchair/BSC         Anterior-Posterior transfers: Mod assist   General transfer comment: Light mod assist with use of the bed pad for scooting; Able to turn and back into recliner; Assist for scooting until able to reach back and use armrests for scooting; Able to clearly direct his care/assist; min assist to guard for loss of balance anteriorly    Ambulation/Gait                  Stairs            Wheelchair Mobility    Modified Rankin (Stroke Patients Only)       Balance Overall balance assessment: Needs assistance   Sitting balance-Leahy Scale: Fair (approaching Good) Sitting balance - Comments: Needed minguard assist for anterior loss of balance while scooting posteriorly                                     Pertinent Vitals/Pain Pain Assessment Pain Assessment: No/denies pain    Home Living Family/patient  expects to be discharged to:: Assisted living ([facility]ment at [facility])                 Home Equipment: Wheelchair - power Additional Comments: Uses power wc for mobility    Prior Function Prior Level of Function : Needs assist             Mobility Comments: power chair for mobility, gets to dining hall, and visits his wife on the same hall) twice a day ADLs Comments: Assist for sponge bathing     Hand Dominance   Dominant Hand: Right    Extremity/Trunk Assessment   Upper Extremity  Assessment Upper Extremity Assessment: Defer to OT evaluation    Lower Extremity Assessment Lower Extremity Assessment:  (Remote history of bil AKAs; hip ROM WFL for pt's baseline functinal activities)    Cervical / Trunk Assessment Cervical / Trunk Assessment: Kyphotic (with upper trunk stiffness)  Communication   Communication: Expressive difficulties  Cognition Arousal/Alertness: Awake/alert Behavior During Therapy: WFL for tasks assessed/performed Overall Cognitive Status: Within Functional Limits for tasks assessed                                          General Comments General comments (skin integrity, edema, etc.): Notable desaturation with activity on room air; see other PT note of this date for details    Exercises     Assessment/Plan    PT Assessment Patient needs continued PT services  PT Problem List Decreased strength;Decreased range of motion;Decreased activity tolerance;Decreased balance;Decreased mobility;Cardiopulmonary status limiting activity;Other (comment) (decr functional capacity)       PT Treatment Interventions DME instruction;Functional mobility training;Therapeutic activities;Therapeutic exercise;Balance training;Patient/family education;Wheelchair mobility training    PT Goals (Current goals can be found in the Care Plan section)  Acute Rehab PT Goals Patient Stated Goal: regain his independence PT Goal Formulation: With patient Time For Goal Achievement: 08/30/21 Potential to Achieve Goals: Good Additional Goals Additional Goal #1: Pt's need for supplemental oxygen will decr from 5 LPM to less than or equal to 2 LPM to keep O2 sats greater than 92% with functional activity    Frequency Min 3X/week     Co-evaluation PT/OT/SLP Co-Evaluation/Treatment: Yes Reason for Co-Treatment: For patient/therapist safety;To address functional/ADL transfers (anticipate activity tolerance) PT goals addressed during session:  Mobility/safety with mobility (activity tolerance)         AM-PAC PT "6 Clicks" Mobility  Outcome Measure Help needed turning from your back to your side while in a flat bed without using bedrails?: None Help needed moving from lying on your back to sitting on the side of a flat bed without using bedrails?: A Lot Help needed moving to and from a bed to a chair (including a wheelchair)?: A Lot Help needed standing up from a chair using your arms (e.g., wheelchair or bedside chair)?: Total Help needed to walk in hospital room?: Total Help needed climbing 3-5 steps with a railing? : Total 6 Click Score: 11    End of Session Equipment Utilized During Treatment: Oxygen Activity Tolerance: Patient tolerated treatment well Patient left: in chair;with call bell/phone within reach;with chair alarm set Nurse Communication: Mobility status PT Visit Diagnosis: Other abnormalities of gait and mobility (R26.89);Other (comment) (decr O2 saturations with activity)    Time: 8101-7510 PT Time Calculation (min) (ACUTE ONLY): 37 min   Charges:   PT Evaluation $PT Eval Moderate  Complexity: [place] Pager [phone] Office [place] [employer] 08/16/2021, 12:37 PM

## 2021-08-17 ENCOUNTER — Inpatient Hospital Stay (HOSPITAL_COMMUNITY): Payer: PPO

## 2021-08-17 DIAGNOSIS — E872 Acidosis, unspecified: Secondary | ICD-10-CM

## 2021-08-17 DIAGNOSIS — E669 Obesity, unspecified: Secondary | ICD-10-CM

## 2021-08-17 DIAGNOSIS — R4182 Altered mental status, unspecified: Secondary | ICD-10-CM

## 2021-08-17 DIAGNOSIS — J9601 Acute respiratory failure with hypoxia: Secondary | ICD-10-CM | POA: Diagnosis not present

## 2021-08-17 LAB — CBC
HCT: 37.9 % — ABNORMAL LOW (ref 39.0–52.0)
Hemoglobin: 12.4 g/dL — ABNORMAL LOW (ref 13.0–17.0)
MCH: 33.5 pg (ref 26.0–34.0)
MCHC: 32.7 g/dL (ref 30.0–36.0)
MCV: 102.4 fL — ABNORMAL HIGH (ref 80.0–100.0)
Platelets: 475 10*3/uL — ABNORMAL HIGH (ref 150–400)
RBC: 3.7 MIL/uL — ABNORMAL LOW (ref 4.22–5.81)
RDW: 14.5 % (ref 11.5–15.5)
WBC: 22.1 10*3/uL — ABNORMAL HIGH (ref 4.0–10.5)
nRBC: 0 % (ref 0.0–0.2)

## 2021-08-17 LAB — BASIC METABOLIC PANEL
Anion gap: 10 (ref 5–15)
BUN: 59 mg/dL — ABNORMAL HIGH (ref 8–23)
CO2: 28 mmol/L (ref 22–32)
Calcium: 9.1 mg/dL (ref 8.9–10.3)
Chloride: 103 mmol/L (ref 98–111)
Creatinine, Ser: 1.34 mg/dL — ABNORMAL HIGH (ref 0.61–1.24)
GFR, Estimated: 48 mL/min — ABNORMAL LOW (ref >60)
Glucose, Bld: 250 mg/dL — ABNORMAL HIGH (ref 70–99)
Potassium: 4.4 mmol/L (ref 3.5–5.1)
Sodium: 141 mmol/L (ref 135–145)

## 2021-08-17 LAB — GLUCOSE, CAPILLARY
Glucose-Capillary: 164 mg/dL — ABNORMAL HIGH (ref 70–99)
Glucose-Capillary: 267 mg/dL — ABNORMAL HIGH (ref 70–99)
Glucose-Capillary: 140 mg/dL — ABNORMAL HIGH (ref 70–99)
Glucose-Capillary: 245 mg/dL — ABNORMAL HIGH (ref 70–99)

## 2021-08-17 MED ORDER — IPRATROPIUM-ALBUTEROL 0.5-2.5 (3) MG/3ML IN SOLN: 3.0000 mL | Freq: Four times a day (QID) | RESPIRATORY_TRACT | Status: DC

## 2021-08-17 MED ORDER — METRONIDAZOLE 500 MG PO TABS
500.0000 mg | ORAL_TABLET | Freq: Two times a day (BID) | ORAL | Status: DC
Start: 2021-08-17 — End: 2021-08-20
  Administered 2021-08-17 – 2021-08-20 (×6): 500 mg via ORAL
  Filled 2021-08-17 (×7): qty 1

## 2021-08-17 MED ORDER — ENSURE ENLIVE PO LIQD
237.0000 mL | Freq: Two times a day (BID) | ORAL | Status: DC
  Administered 2021-08-17 – 2021-08-20 (×4): 237 mL via ORAL

## 2021-08-17 NOTE — Progress Notes (Addendum)
Progress Note   Patient: Nathan Cook DOB: 10-Jan-1922 DOA: 08/15/2021     2 DOS: the patient was seen and examined on 08/17/2021   Brief hospital course: 88 year old past medical history significant for BPH, CKD, diabetes, hypertension, A-fib on Eliquis, PVD with history of bilateral AKA, CVA presents complaining of chest pain.  He has been feeling poorly.  He was sent to the ED for further evaluation.  He was found to be hypoxic,, worsening cough he require 5 L of oxygen in the ED to keep oxygen saturation 92%.  White blood cell 26, lactate 2.6, x-ray new moderate to large left pleural effusion with associated left lung airspace opacities, possible atelectasis versus pneumonia.  Patient admitted with acute hypoxic respiratory failure secondary to pneumonia and left large pleural effusion.  Assessment and Plan: * Acute respiratory failure with hypoxia ([place])- (present on admission) - Patient presented with new oxygen requirement, oxygen saturation 86 on room air.  Currently requiring 5 L of oxygen to keep sat 90%. -Likely secondary to left-sided pleural effusion and pneumonia. -He declined thoracentesis. -Continue with IV antibiotics, flutter valve, incentive spirometry. -Palliative care consulted for goals of care. Plan to continue with antibiotics.   Lactic acidosis Secondary to hypoxemia.   Obesity (BMI 30-39.9) Now with poor oral intake.   AMS (altered mental status) Patient is more sleepy, lethargic. Suspect this is related to infectious process.  CBG normal. Vitals stable.   Goals of care, counseling/discussion -Declines thoracentesis -DNR form is present at the bedside -Wants to treat the treatable - within reason  CAP (community acquired pneumonia)- (present on admission) -Patient presented with hypoxia, worsening cough, chest x-ray; new moderate to large left pleural effusion and associated left lung airspace opacity. -Prior history of aspiration. Speech  therapy following, planning MBS.  -Influenza negative. -COVID-19 negative. -Schedule Mucinex -Flutter valve incentive spirometry -Declined thoracentesis -Continue with IV ceftriaxone and azithromycin -Palliative  care consulted for goals of care, plan to continue with antibiotics.  -Added Nebulizer, flutter valve.   Pleural effusion associated with pulmonary infection- (present on admission) -Moderate to large pleural effusion, likely parapneumonic -Decline paracentesis.   S/P AKA (above knee amputation) bilateral (HCC) -B AKAs -Appear to be healing well -He is usually able to transfer well but is currently too weak -PT/OT consulted. ALF.   History of CVA (cerebrovascular accident) -Has residual RUE deficits  -On eliquis   Dysphagia -Patient with h/o dysphagia -Evaluated by speech recommend Regular thin liquid diet. Had MBS; showed moderate to severe pharyngeal dysphagia. Speech recommend liquid as primary source of nutrition  and solid for pleasure.    Paroxysmal atrial fibrillation ([place])- (present on admission) -Rate controlled with Lopressor -Continue Eliquis.   CKD (chronic kidney disease) stage 3, GFR 30-59 ml/min (HCC)- (present on admission) -AKI on CKD  stage 3a.  -Cr peak to 1,.4. Prior Ct 1.1.  Improved.   Controlled type 2 diabetes mellitus with stage 3 chronic kidney disease, without long-term current use of insulin ([place])- (present on admission) -Most recent A1c was 6.8 -Continue with SSI.  -Holding metformin while inpatient.    Benign hypertension with chronic kidney disease, stage III ([place])- (present on admission) -Continue Lopressor -Continue with as needed hydralazine.  Hyperlipidemia LDL goal <70- (present on admission) -Continue Zocor for now.         Subjective:  He appears more weak today. He is congested, thick phlegm.   Came back later and he is more sleepy and weak.   Physical Exam: Vitals:  08/17/21 0500 08/17/21 0959 08/17/21  1204 08/17/21 1526  BP: 105/73 (!) 116/50  (!) 128/52  Pulse: 86 93 91 78  Resp: 16 18 18  (!) 22  Temp: (!) 97.5 F (36.4 C) 97.9 F (36.6 C)  98.1 F (36.7 C)  TempSrc: Oral Oral  Axillary  SpO2: 90% 90% 91% 96%  Weight:      Height:        Genera; Chronic ill appearing Lung; BL ronchus.  CVS; S 1, S2  RRR  Data Reviewed:  Cbc and Bmet  Family Communication: Daughter over phone, explain to her patient is more weak, sleepy, if condition continue to get worse, we should focus on comfort care.   Disposition: Status is: Inpatient Remains inpatient appropriate because: management of respiratory failure         Planned Discharge Destination:  ALF with [facility]     Time spent: 45 minutes  Author: Elmarie Shiley, MD 08/17/2021 5:49 PM  For on call review [email or web address].

## 2021-08-17 NOTE — Progress Notes (Signed)
Speech Language Pathology Treatment: Dysphagia  Patient Details Name: Nathan Cook MRN: [id number] DOB: 10-12-1921 Today's Date: 08/17/2021 Time: 2878-6767 SLP Time Calculation (min) (ACUTE ONLY): 20 min  Assessment / Plan / Recommendation Clinical Impression  Patient seen by SLP for skilled treatment session focusing on dysphagia, with daughter Donia Guiles in room for education as well. When SLP arrived, daughter was feeding him food from dinner meal tray. He did exhibit delayed throat clearing and coughing and prolonged mastication. Patient was very alert during session. 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 SLP had spoken with patient's daughter on the phone today as well, and reiterated recommendations for primary nutrition to be focused on liquids and that solid foods were more for comfort/pleasure. Daughter verbalized understanding and she continues to state that she would like patient to get what types of foods and drinks he enjoys as he is 88 years old. SLP is recommending to follow up at least one more time to ensure no further education needed prior to discharge.    HPI HPI: Nathan Cook is a 88 yo male who presented to ED from [facility] with 3 days of left sided chest pain in setting of worsening cough; signficant rales and new O2 requirement of 5L [place]. MD concerned for pna.  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 It was initially quite severe, improved over time, but function still waxes and wanes per pt and his daughter, Donia Guiles.  He endorses that he produces a lot of phlegm; has to carry a cup with him throughout the day.  CXR 2/20 shows large (>50%) pleural effusion with opacity, concerning for PNA. PMHx stroke, B/l AKA due to PAD.      SLP Plan  Continue with current plan of care       Recommendations for follow up therapy are one component of a multi-disciplinary discharge planning process, led by the attending physician.  Recommendations may be updated based on patient status, additional functional criteria and insurance authorization.    Recommendations  Diet recommendations: Regular;Thin liquid Liquids provided via: Straw;Cup Medication Administration: Whole meds with puree Supervision: Staff to assist with self feeding Compensations: Slow rate;Small sips/bites Postural Changes and/or Swallow Maneuvers: Seated upright 90 degrees;Upright 30-60 min after meal                Oral Care Recommendations: Oral care BID Follow Up Recommendations: No SLP follow up Assistance recommended at discharge: Frequent or constant Supervision/Assistance SLP Visit Diagnosis: Dysphagia, oropharyngeal phase (R13.12) Plan: Continue with current plan of care           Sonia Baller, MA, CCC-SLP Speech Therapy

## 2021-08-17 NOTE — Assessment & Plan Note (Signed)
Secondary to hypoxemia.

## 2021-08-17 NOTE — Assessment & Plan Note (Signed)
Now with poor oral intake.

## 2021-08-17 NOTE — Assessment & Plan Note (Addendum)
Patient is more sleepy, lethargic. Suspect this is related to infectious process.  CBG normal. Vitals stable.  He is more alert. MS fluctuates.  He is alert and oriented. He wishes to go Home.

## 2021-08-17 NOTE — Progress Notes (Signed)
°   08/17/21 1550  Clinical Encounter Type  Visited With Patient not available  Visit Type Initial;Spiritual support   Chaplain attempted to visit patient while rounding on the floor.  Patient was not available because of care being provided.   [place] Resident  [facility] [phone]

## 2021-08-17 NOTE — Progress Notes (Signed)
Modified Barium Swallow Progress Note  Patient Details  Name: Nathan Cook MRN: [id number] Date of Birth: 1921/07/18  Today's Date: 08/17/2021  Modified Barium Swallow completed.  Full report located under Chart Review in the Imaging Section.  Brief recommendations include the following:  Clinical Impression  Patient presents with a mildly impaired oral dysphagia and a moderate-severely impaired pharyngeal phase dysphagia. During oral phase, patient with decreased anterior to posterior transit of boluses. During pharyngeal phase, swallow was initiated at level of vallecular sinus for all tested boluses. He exhibited moderate amount of vallecular sinus residuals and mild amount of pyriform sinus residuals with thin liquids, moderate vallecular residuals with puree solids and trace pyriform sinus residuals. He exhibited flash penetration (PAS 2) with thin liquids and penetration of mild amount of nectar thick liquids without clearance but also without observed aspiration. Moderate amount of barium residuals remained in vallecular sinus during the study and even when patient cued to perform dry swallows, clearance of residuals each additional swallow was minimal. Prominent cricopharyngeal bar was observed and questionable cervical osteophytes (no radiologist present to confirm) with decreased UES opening. SLP is recommending for patient to consider drinking liquids as primary nutrition source and have solids for pleasure.   Swallow Evaluation Recommendations       SLP Diet Recommendations: Other (Comment);Thin liquid (thin liquids for primary nutrition, solids for comfort/pleasure)       Medication Administration: Other (Comment) (via alternative means versus liquid medications)   Supervision: Full supervision/cueing for compensatory strategies;Staff to assist with self feeding   Compensations: Slow rate;Small sips/bites   Postural Changes: Seated upright at 90 degrees;Remain  semi-upright after after feeds/meals (Comment) (30 minutes)   Oral Care Recommendations: Oral care QID;Staff/trained caregiver to provide oral care        Sonia Baller, MA, CCC-SLP Speech Therapy

## 2021-08-17 NOTE — Progress Notes (Signed)
Per RN patient unresponsive to sternal rub.   BP 128/52  HR78  RR 22 O2 sat 96% on 6L [place] Temp 98.1  CBG 140 Patient awoke after significant stimulation and oral care.  He stated he was tired.  He is still very drowsy no focal deficit noted different than his baseline.   Patient with DNR status and per notes desire for no invasive procedures.   Awaiting call back from MD.   RN to call if additional interventions are needed.

## 2021-08-17 NOTE — Progress Notes (Signed)
Patient found around 1515 to be responsive to sternal rub and painful stimuli only. Vitals checked and recorded, no differences from previous check. CBG 140. Paged MD Regalado. Also paged rapid response nurse Brooke. Rapid response nurse was able to wake patient by applying cool cloths to face, back of neck, sternal rubs and vigourous rubbing around patients shoulders. Patient still slightly droswy afterwards, so rapid response nurse performed oral care which agitated the patient, he woke up and responded to our questions, stating he was just tired. MD called primary nurse back and stated she was en route to patient.

## 2021-08-18 DIAGNOSIS — J9601 Acute respiratory failure with hypoxia: Secondary | ICD-10-CM | POA: Diagnosis not present

## 2021-08-18 DIAGNOSIS — N1832 Chronic kidney disease, stage 3b: Secondary | ICD-10-CM

## 2021-08-18 DIAGNOSIS — E1122 Type 2 diabetes mellitus with diabetic chronic kidney disease: Secondary | ICD-10-CM

## 2021-08-18 LAB — CBC
HCT: 34.6 % — ABNORMAL LOW (ref 39.0–52.0)
Hemoglobin: 11.5 g/dL — ABNORMAL LOW (ref 13.0–17.0)
MCH: 33.9 pg (ref 26.0–34.0)
MCHC: 33.2 g/dL (ref 30.0–36.0)
MCV: 102.1 fL — ABNORMAL HIGH (ref 80.0–100.0)
Platelets: 447 10*3/uL — ABNORMAL HIGH (ref 150–400)
RBC: 3.39 MIL/uL — ABNORMAL LOW (ref 4.22–5.81)
RDW: 14.6 % (ref 11.5–15.5)
WBC: 22.9 10*3/uL — ABNORMAL HIGH (ref 4.0–10.5)
nRBC: 0 % (ref 0.0–0.2)

## 2021-08-18 LAB — GLUCOSE, CAPILLARY
Glucose-Capillary: 185 mg/dL — ABNORMAL HIGH (ref 70–99)
Glucose-Capillary: 165 mg/dL — ABNORMAL HIGH (ref 70–99)
Glucose-Capillary: 185 mg/dL — ABNORMAL HIGH (ref 70–99)
Glucose-Capillary: 210 mg/dL — ABNORMAL HIGH (ref 70–99)

## 2021-08-18 NOTE — Plan of Care (Signed)
  Problem: Pain Managment: Goal: General experience of comfort will improve Outcome: Progressing   Problem: Safety: Goal: Ability to remain free from injury will improve Outcome: Progressing   

## 2021-08-18 NOTE — Progress Notes (Signed)
Progress Note   Patient: Nathan Cook HYQ:[id number] DOB: [birth year] DOA: 08/15/2021     3 DOS: the patient was seen and examined on 08/18/2021   Brief hospital course: 88 year old past medical history significant for BPH, CKD, diabetes, hypertension, A-fib on Eliquis, PVD with history of bilateral AKA, CVA presents complaining of chest pain.  He has been feeling poorly.  He was sent to the ED for further evaluation.  He was found to be hypoxic,, worsening cough he require 5 L of oxygen in the ED to keep oxygen saturation 92%.  White blood cell 26, lactate 2.6, x-ray new moderate to large left pleural effusion with associated left lung airspace opacities, possible atelectasis versus pneumonia.  Patient admitted with acute hypoxic respiratory failure secondary to pneumonia and left large pleural effusion.  Assessment and Plan: * Acute respiratory failure with hypoxia ([place])- (present on admission) - Patient presented with new oxygen requirement, oxygen saturation 86 on room air.  Currently requiring 5 L of oxygen to keep sat 90%. -Likely secondary to left-sided pleural effusion and pneumonia. -He declined thoracentesis. -Continue with IV antibiotics, flutter valve, incentive spirometry. -Palliative care consulted for goals of care. Plan for meeting again tomorrow, to determine disposition.   Lactic acidosis Secondary to hypoxemia.   Obesity (BMI 30-39.9) Now with poor oral intake.   AMS (altered mental status) Patient is more sleepy, lethargic. Suspect this is related to infectious process.  CBG normal. Vitals stable.  He wake up more later in the afternoon yesterday. MS fluctuates.   Goals of care, counseling/discussion -Declines thoracentesis -DNR form is present at the bedside -Wants to treat the treatable - within reason  CAP (community acquired pneumonia)- (present on admission) -Patient presented with hypoxia, worsening cough, chest x-ray; new moderate to large left pleural  effusion and associated left lung airspace opacity. -Prior history of aspiration. Speech therapy following, planning MBS.  -Influenza negative. -COVID-19 negative. -Schedule Mucinex -Flutter valve incentive spirometry -Declined thoracentesis -Continue with IV ceftriaxone and azithromycin -Palliative  care consulted for goals of care, plan to continue with antibiotics.  -Added Nebulizer, flutter valve.   Pleural effusion associated with pulmonary infection- (present on admission) -Moderate to large pleural effusion, likely parapneumonic -Decline paracentesis.   S/P AKA (above knee amputation) bilateral (HCC) -B AKAs -Appear to be healing well -He is usually able to transfer well but is currently too weak -PT/OT consulted. ALF.   History of CVA (cerebrovascular accident) -Has residual RUE deficits  -On eliquis   Dysphagia -Patient with h/o dysphagia -Evaluated by speech recommend Regular thin liquid diet. Had MBS; showed moderate to severe pharyngeal dysphagia. Speech recommend liquid as primary source of nutrition  and solid for pleasure.    Paroxysmal atrial fibrillation ([place])- (present on admission) -Rate controlled with Lopressor -Continue Eliquis.   CKD (chronic kidney disease) stage 3, GFR 30-59 ml/min (HCC)- (present on admission) -AKI on CKD  stage 3a.  -Cr peak to 1,.4. Prior Ct 1.1.  Improved.   Controlled type 2 diabetes mellitus with stage 3 chronic kidney disease, without long-term current use of insulin ([place])- (present on admission) -Most recent A1c was 6.8 -Continue with SSI.  -Holding metformin while inpatient.    Benign hypertension with chronic kidney disease, stage III ([place])- (present on admission) -Continue Lopressor -Continue with as needed hydralazine.  Hyperlipidemia LDL goal <70- (present on admission) -Continue Zocor for now.         Subjective:  He is sleepy, open eyes to voice, said few words.  Physical Exam: Vitals:   08/17/21  1526 08/17/21 2128 08/18/21 0341 08/18/21 0910  BP: (!) 128/52 136/67 (!) 118/53 (!) 112/55  Pulse: 78 87 98 94  Resp: (!) 22 16 18 18   Temp: 98.1 F (36.7 C) 97.9 F (36.6 C) 99 F (37.2 C) 98 F (36.7 C)  TempSrc: Axillary  Oral Axillary  SpO2: 96% 93% (!) 86% 90%  Weight:      Height:       General; NAD, chronic ill. Lethargic.  CVS; S 1, S 2 RRR Lung; BL ronchus.   Data Reviewed:  Cbc reviewed.   Family Communication: Daughter at bedside 2/22  Disposition: Status is: Inpatient Remains inpatient appropriate because: Continue with IV antibiotics.           Planned Discharge Destination:  family will meet again with palliative tomorrow to discussed disposition.      Time spent: 45 minutes  Author: Elmarie Shiley, MD 08/18/2021 4:04 PM  For on call review [email or web address].

## 2021-08-18 NOTE — Progress Notes (Signed)
Daily Progress Note   Patient Name: Nathan Cook       Date: 08/18/2021 DOB: 04-29-1922  Age: 88 y.o. MRN#: [id number] Attending Physician: Elmarie Shiley, MD Primary Care Physician: Virgie Dad, MD Admit Date: 08/15/2021  Reason for Consultation/Follow-up: Establishing goals of care  Subjective: Medical records reviewed. Patient assessed at the bedside and remains somnolent, breathing comfortably. His daughter Donia Guiles is present at the bedside.  Created space and opportunity for thoughts and feelings on patient's illness. We discussed interval history since our initial conversation, including MBS study results and patient's lack of progress with therapies due to worsening lethargy. Donia Guiles tells me that family is well aware of patient's prognosis and are currently coordinating to come visit him. Emotional support and therapeutic listening provided as she reflected on his life and the support from family throughout the years. We discussed the option of hospice support at [facility], as it is very important for him to return there for his final days.   Reviewed the option of inpatient comfort-focused care in detail. Patient would no longer receive aggressive medical interventions such as continuous vital signs, lab work, radiology testing, or medications not focused on comfort. All care would focus on how the patient is looking and feeling. This would include management of any symptoms that may cause discomfort, pain, shortness of breath, cough, nausea, agitation, anxiety, and/or secretions etc. Symptoms would be managed with medications and other non-pharmacological interventions such as spiritual support if requested, repositioning, music therapy, or therapeutic listening. Ginny verbalized  understanding and appreciation, stating that she will discuss further with patient and family when they arrive tonight before making a decision.    Questions and concerns addressed. PMT will continue to support holistically.   Length of Stay: 3  Current Medications: Scheduled Meds:   apixaban  2.5 mg Oral BID   docusate sodium  100 mg Oral BID   feeding supplement  237 mL Oral BID BM   gabapentin  100 mg Oral BID   guaiFENesin  1,200 mg Oral BID   insulin aspart  0-9 Units Subcutaneous TID WC   metoprolol tartrate  12.5 mg Oral BID   metroNIDAZOLE  500 mg Oral Q12H   simvastatin  10 mg Oral q1800   sodium chloride flush  3 mL Intravenous Q12H  Continuous Infusions:  azithromycin 500 mg (08/18/21 1211)   cefTRIAXone (ROCEPHIN)  IV 1 g (08/18/21 1105)    PRN Meds: acetaminophen **OR** acetaminophen, albuterol, bisacodyl, hydrALAZINE, methocarbamol, ondansetron **OR** ondansetron (ZOFRAN) IV, oxyCODONE, polyethylene glycol, polyvinyl alcohol  Physical Exam Vitals and nursing note reviewed.  Constitutional:      General: He is sleeping. He is not in acute distress.    Appearance: He is ill-appearing.     Interventions: Nasal cannula in place.  Cardiovascular:     Rate and Rhythm: Normal rate.  Pulmonary:     Effort: Pulmonary effort is normal. No accessory muscle usage or respiratory distress.  Skin:    General: Skin is warm and dry.  Neurological:     Mental Status: He is lethargic.            Vital Signs: BP (!) 112/55 (BP Location: Left Arm)    Pulse 94    Temp 98 F (36.7 C) (Axillary)    Resp 18    Ht 3\' 8"  (1.118 m)    Wt 47 kg    SpO2 90%    BMI 37.63 kg/m  SpO2: SpO2: 90 % O2 Device: O2 Device: Nasal Cannula O2 Flow Rate: O2 Flow Rate (L/min): 3 L/min  Intake/output summary:  Intake/Output Summary (Last 24 hours) at 08/18/2021 1510 Last data filed at 08/18/2021 1300 Gross per 24 hour  Intake 123 ml  Output 450 ml  Net -327 ml   LBM: Last BM Date :  08/15/21 (per pt) Baseline Weight: Weight: 47 kg Most recent weight: Weight: 47 kg       Palliative Assessment/Data: 20%      Patient Active Problem List   Diagnosis Date Noted   AMS (altered mental status) 08/17/2021   Obesity (BMI 30-39.9) 08/17/2021   Lactic acidosis 08/17/2021   Acute respiratory failure with hypoxia (HCC) 08/15/2021   Pleural effusion associated with pulmonary infection 08/15/2021   CAP (community acquired pneumonia) 08/15/2021   Goals of care, counseling/discussion 08/15/2021   Fatigue 08/12/2021   Microalbuminuria due to type 2 diabetes mellitus ([place]) 09/15/2019   Constipation 05/16/2019   Fall 03/04/2019   S/P AKA (above knee amputation) bilateral ([place]) 02/07/2019   Hyponatremia 01/24/2019   History of CVA (cerebrovascular accident) 01/16/2019   Hypotension 12/14/2018   Weight loss 10/23/2018   Blood loss anemia 10/21/2018   Neuropathic pain 10/21/2018   S/P AKA (above knee amputation) unilateral, left ([place]) 10/15/2018   Critical lower limb ischemia ([place]) 10/09/2018   History of stroke with residual deficit 08/16/2018   Dysphagia 04/12/2018   Paroxysmal atrial fibrillation ([place])    Stroke ([place]) 04/01/2018   PAD (peripheral artery disease) ([place]) 03/20/2018   Generalized osteoarthritis 06/27/2017   B12 deficiency 06/27/2017   History of TIA (transient ischemic attack) 03/21/2017   Vitamin D deficiency 03/21/2017   Dry eyes, bilateral 03/21/2017   CKD (chronic kidney disease) stage 3, GFR 30-59 ml/min (HCC) 03/21/2017   Cervicalgia 01/04/2016   Pain in joint, shoulder region 05/11/2015   BPH (benign prostatic hyperplasia) 10/14/2014   Hyperlipidemia LDL goal <70    Benign hypertension with chronic kidney disease, stage III ([place])    Controlled type 2 diabetes mellitus with stage 3 chronic kidney disease, without long-term current use of insulin ([place])    Left knee pain 05/27/2014   Brainstem infarct, acute ([place]) s/p IV tPA 11/06/2005   History  of colonic polyps 10/14/2003    Palliative Care Assessment & Plan   Patient  Profile: 88 y.o. male  with past medical history of BPH, CKD, diabetes, hypertension, A-fib on Eliquis, PVD with history of bilateral AKA, CVA admitted on 08/15/2021 with chest pain and left-sided abdominal pain.   In the ED he was found hypoxic and admitted with acute respiratory failure secondary to pneumonia and left large pleural effusion. He has declined thoracentesis. Palliative care has been consulted to assist with goals of care conversation.  Assessment: Acute respiratory failure with hypoxia PNA, left, with large pleural effusion Chronic dysphagia Goals of care conversation  Recommendations/Plan: Continue current care for now, ongoing goc discussions with additional family members arriving out of town tonight Patient's family is leaning towards hospice at his facility and the primary goal is comfort, returning to his familiar environment Psychosocial and emotional support provided PMT will continue to follow and support   Prognosis:  < 6 months  Discharge Planning: [facility] with [facility]ospice  Total time: I spent 60 minutes in the care of the patient today in the above activities and documenting the encounter.   Dorthy Cooler, PA-C Palliative Medicine Team Team phone # [phone]  Thank you for allowing the Palliative Medicine Team to assist in the care of this patient. Please utilize secure chat with additional questions, if there is no response within 30 minutes please call the above phone number.  Palliative Medicine Team providers are available by phone from 7am to 7pm daily and can be reached through the team cell phone.  Should this patient require assistance outside of these hours, please call the patient's attending physician.

## 2021-08-18 NOTE — Care Management Important Message (Signed)
Important Message  Patient Details  Name: Nathan Cook MRN: [id number] Date of Birth: 1922-03-08   Medicare Important Message Given:  Yes     Orbie Pyo 08/18/2021, 3:27 PM

## 2021-08-18 NOTE — Progress Notes (Signed)
Physical Therapy Treatment Patient Details Name: Nathan Cook MRN: [id number] DOB: Mar 07, 1922 Today's Date: 08/18/2021   History of Present Illness Nathan Cook is a 88 y.o. male admitted 08/15/21 with chest pain, felt poorly all last week, hypoxia, weakness, cough. Dx with Acute respiratory failure with hypoxia, PE. PMH includes BPH; CKD: DM; HTN; HLD; afib on Eliquis; PVD with B AKA; and CVA RUE deficits as well as speech pattern dysfunction    PT Comments    Patient with increased lethargy and confusion compared to evaluation. Patient required modA+2 for AP transfer to recliner which is more assistance than previous session. Patient requires up to 5L O2 during mobility to maintain spO2 >90%. If patient continues to require +2 and not progressing towards goals, patient may require short term rehab at [facility].     Recommendations for follow up therapy are one component of a multi-disciplinary discharge planning process, led by the attending physician.  Recommendations may be updated based on patient status, additional functional criteria and insurance authorization.  Follow Up Recommendations  Home health PT (at patient's apt/room)     Assistance Recommended at Discharge Set up Supervision/Assistance  Patient can return home with the following A little help with bathing/dressing/bathroom   Equipment Recommendations  None recommended by PT    Recommendations for Other Services       Precautions / Restrictions Precautions Precautions: Fall Precaution Comments: Fall risk present but minimal Restrictions Weight Bearing Restrictions: No     Mobility  Bed Mobility Overal bed mobility: Needs Assistance Bed Mobility: Supine to Sit     Supine to sit: Mod assist     General bed mobility comments: Mod handheld assist to pull from semi-supine to sit; handheld assist given from directly in front of pt (OT at foot of bed)    Transfers Overall transfer level: Needs  assistance Equipment used:  (bed pad for sliding) Transfers: Bed to chair/wheelchair/BSC         Anterior-Posterior transfers: Mod assist, +2 safety/equipment   General transfer comment: heavy mod assist with use of the bed pad for scooting; Able to turn and back into recliner with multimodal cues and assist for balance; Pt with increased balance deficits and increased need for cueing assist    Ambulation/Gait                   Stairs             Wheelchair Mobility    Modified Rankin (Stroke Patients Only)       Balance Overall balance assessment: Needs assistance Sitting-balance support: Bilateral upper extremity supported Sitting balance-Leahy Scale: Poor Sitting balance - Comments: at least min A today for sitting balance Postural control: Other (comment) (forward flexed position)                                  Cognition Arousal/Alertness: Awake/alert Behavior During Therapy: WFL for tasks assessed/performed Overall Cognitive Status: Impaired/Different from baseline Area of Impairment: Orientation, Problem solving                 Orientation Level: Disoriented to, Place, Situation           Problem Solving: Slow processing, Requires verbal cues General Comments: difficult to understand speech at times (baseline) unable to determine if patient thought he was on a cruise ship or talking about old career. Pt with increased lethargy, trouble following directions this time  and initiating tasks        Exercises      General Comments General comments (skin integrity, edema, etc.): Continues to require 5L O2 to maintain saturations at >90%      Pertinent Vitals/Pain Pain Assessment Pain Assessment: No/denies pain    Home Living                          Prior Function            PT Goals (current goals can now be found in the care plan section) Acute Rehab PT Goals Patient Stated Goal: regain his  independence PT Goal Formulation: With patient Time For Goal Achievement: 08/30/21 Potential to Achieve Goals: Good Progress towards PT goals: Progressing toward goals    Frequency    Min 3X/week      PT Plan Current plan remains appropriate    Co-evaluation PT/OT/SLP Co-Evaluation/Treatment: Yes Reason for Co-Treatment: Necessary to address cognition/behavior during functional activity;For patient/therapist safety;To address functional/ADL transfers (activity tolerance) PT goals addressed during session: Mobility/safety with mobility;Balance;Strengthening/ROM OT goals addressed during session: ADL's and self-care;Strengthening/ROM      AM-PAC PT "6 Clicks" Mobility   Outcome Measure  Help needed turning from your back to your side while in a flat bed without using bedrails?: None Help needed moving from lying on your back to sitting on the side of a flat bed without using bedrails?: A Lot Help needed moving to and from a bed to a chair (including a wheelchair)?: A Lot Help needed standing up from a chair using your arms (e.g., wheelchair or bedside chair)?: Total Help needed to walk in hospital room?: Total Help needed climbing 3-5 steps with a railing? : Total 6 Click Score: 11    End of Session Equipment Utilized During Treatment: Oxygen Activity Tolerance: Patient tolerated treatment well Patient left: in chair;with call bell/phone within reach;with chair alarm set Nurse Communication: Mobility status PT Visit Diagnosis: Other abnormalities of gait and mobility (R26.89);Other (comment) (decr O2 saturations with activity)     Time: 3212-2482 PT Time Calculation (min) (ACUTE ONLY): 27 min  Charges:  $Therapeutic Activity: 8-22 mins                     Nathan Cook PT, DPT Acute Rehabilitation Services Pager [phone] Office [phone]    Nathan Cook 08/18/2021, 2:29 PM

## 2021-08-18 NOTE — Progress Notes (Signed)
Occupational Therapy Treatment Patient Details Name: Nathan Cook MRN: [id number] DOB: 06/18/1922 Today's Date: 08/18/2021   History of present illness Nathan Cook is a 88 y.o. male admitted 08/15/21 with chest pain, felt poorly all last week, hypoxia, weakness, cough. Dx with Acute respiratory failure with hypoxia, PE. PMH includes BPH; CKD: DM; HTN; HLD; afib on Eliquis; PVD with B AKA; and CVA RUE deficits as well as speech pattern dysfunction   OT comments  Pt with noted increased lethargy and confusion from previous session, required much more assist for AP transfer (mod A +2) to recliner from bed, and grooming tasks (min to mod A) while in the recliner. Pt continues to require 5LO2 to maintain SpO2>90%. When removed for face washing, SpO2 dropped to 85%. Pt not progressing towards OT goals this session. OT will continue to monitor. If he continues to need increased support like today, will need to consider dc update to SNF instead of ALF with HH.    Recommendations for follow up therapy are one component of a multi-disciplinary discharge planning process, led by the attending physician.  Recommendations may be updated based on patient status, additional functional criteria and insurance authorization.    Follow Up Recommendations  Home health OT (at ALF)    Assistance Recommended at Discharge Intermittent Supervision/Assistance  Patient can return home with the following  A lot of help with walking and/or transfers;A lot of help with bathing/dressing/bathroom   Equipment Recommendations  None recommended by OT (defer to ALF)    Recommendations for Other Services PT consult    Precautions / Restrictions Precautions Precautions: Fall Precaution Comments: Fall risk present but minimal Restrictions Weight Bearing Restrictions: No       Mobility Bed Mobility Overal bed mobility: Needs Assistance Bed Mobility: Supine to Sit     Supine to sit: Mod assist     General  bed mobility comments: Mod handheld assist to pull from semi-supine to sit; handheld assist given from directly in front of pt (OT at foot of bed)    Transfers Overall transfer level: Needs assistance Equipment used:  (bed pad for sliding) Transfers: Bed to chair/wheelchair/BSC         Anterior-Posterior transfers: Mod assist, +2 safety/equipment   General transfer comment: heavy mod assist with use of the bed pad for scooting; Able to turn and back into recliner with multimodal cues and assist for balance; Pt with increased balance deficits and increased need for cueing assist     Balance Overall balance assessment: Needs assistance Sitting-balance support: Bilateral upper extremity supported Sitting balance-Leahy Scale: Poor Sitting balance - Comments: at least min A today for sitting balance Postural control: Other (comment) (forward flexed position)                                 ADL either performed or assessed with clinical judgement   ADL Overall ADL's : Needs assistance/impaired Eating/Feeding: Sitting;Minimal assistance Eating/Feeding Details (indicate cue type and reason): cut up food, Pt able to use utensils min A to initiate task Grooming: Wash/dry hands;Wash/dry face;Sitting;Minimal assistance Grooming Details (indicate cue type and reason): in recliner Upper Body Bathing: Moderate assistance Upper Body Bathing Details (indicate cue type and reason): for back             Toilet Transfer: +2 for physical assistance;+2 for safety/equipment;Moderate assistance Toilet Transfer Details (indicate cue type and reason): posterior transfer Toileting- Clothing Manipulation and Hygiene:  Sitting/lateral lean;Maximal assistance       Functional mobility during ADLs: +2 for physical assistance;+2 for safety/equipment;Moderate assistance (A/P transfer to recliner) General ADL Comments: decreased ability since last session    Extremity/Trunk Assessment  Upper Extremity Assessment Upper Extremity Assessment: Generalized weakness   Lower Extremity Assessment Lower Extremity Assessment: Defer to PT evaluation        Vision       Perception     Praxis      Cognition Arousal/Alertness: Awake/alert Behavior During Therapy: WFL for tasks assessed/performed Overall Cognitive Status: Impaired/Different from baseline Area of Impairment: Orientation, Problem solving                 Orientation Level: Disoriented to, Place, Situation           Problem Solving: Slow processing, Requires verbal cues General Comments: difficult to understand speech at times (baseline) unable to determine if patient thought he was on a cruise ship or talking about old career. Pt with increased lethargy, trouble following directions this time and initiating tasks        Exercises      Shoulder Instructions       General Comments Continues to require 5L O2 to maintain saturations at >90%    Pertinent Vitals/ Pain       Pain Assessment Pain Assessment: No/denies pain  Home Living                                          Prior Functioning/Environment              Frequency  Min 2X/week        Progress Toward Goals  OT Goals(current goals can now be found in the care plan section)  Progress towards OT goals: Not progressing toward goals - comment (decreased cognition and increased lethargy)  Acute Rehab OT Goals Patient Stated Goal[facility] [place] [facility] OT Goal Formulation: With patient Time For Goal Achievement: 08/30/21 Potential to Achieve Goals: Good ADL Goals Pt Will Perform Grooming: with set-up;sitting Pt Will Perform Upper Body Dressing: with modified independence;sitting Pt Will Perform Lower Body Dressing: with modified independence;bed level Pt Will Transfer to Toilet: with min guard assist;anterior/posterior transfer Pt Will Perform Toileting - Clothing Manipulation and  hygiene: with min guard assist;sitting/lateral leans Additional ADL Goal #1: Pt will perform bed mobility at min A prior to engaging in ADL  Plan Discharge plan remains appropriate;Frequency remains appropriate    Co-evaluation    PT/OT/SLP Co-Evaluation/Treatment: Yes Reason for Co-Treatment: Necessary to address cognition/behavior during functional activity;For patient/therapist safety;To address functional/ADL transfers;Other (comment) (activity tolerance) PT goals addressed during session: Mobility/safety with mobility;Balance;Strengthening/ROM OT goals addressed during session: ADL's and self-care;Strengthening/ROM      AM-PAC OT "6 Clicks" Daily Activity     Outcome Measure   Help from another person eating meals?: A Little Help from another person taking care of personal grooming?: A Little Help from another person toileting, which includes using toliet, bedpan, or urinal?: A Lot Help from another person bathing (including washing, rinsing, drying)?: A Lot Help from another person to put on and taking off regular upper body clothing?: A Little Help from another person to put on and taking off regular lower body clothing?: A Lot 6 Click Score: 15    End of Session Equipment Utilized During Treatment: Oxygen (5L)  OT  Visit Diagnosis: Muscle weakness (generalized) (M62.81)   Activity Tolerance Patient tolerated treatment well   Patient Left in chair;with chair alarm set;with call bell/phone within reach   Nurse Communication Mobility status;Precautions        Time: 1251-1319 OT Time Calculation (min): 28 min  Charges: OT General Charges $OT Visit: 1 Visit OT Treatments $Self Care/Home Management : 8-22 mins  Jesse Sans OTR/L Acute Rehabilitation Services Pager: [phone] Office: [place] 08/18/2021, 1:31 PM

## 2021-08-19 DIAGNOSIS — E870 Hyperosmolality and hypernatremia: Secondary | ICD-10-CM

## 2021-08-19 LAB — GLUCOSE, CAPILLARY
Glucose-Capillary: 169 mg/dL — ABNORMAL HIGH (ref 70–99)
Glucose-Capillary: 274 mg/dL — ABNORMAL HIGH (ref 70–99)
Glucose-Capillary: 137 mg/dL — ABNORMAL HIGH (ref 70–99)
Glucose-Capillary: 121 mg/dL — ABNORMAL HIGH (ref 70–99)

## 2021-08-19 LAB — BASIC METABOLIC PANEL
Anion gap: 7 (ref 5–15)
BUN: 55 mg/dL — ABNORMAL HIGH (ref 8–23)
CO2: 28 mmol/L (ref 22–32)
Calcium: 9 mg/dL (ref 8.9–10.3)
Chloride: 112 mmol/L — ABNORMAL HIGH (ref 98–111)
Creatinine, Ser: 1.26 mg/dL — ABNORMAL HIGH (ref 0.61–1.24)
GFR, Estimated: 52 mL/min — ABNORMAL LOW (ref >60)
Glucose, Bld: 199 mg/dL — ABNORMAL HIGH (ref 70–99)
Potassium: 4.3 mmol/L (ref 3.5–5.1)
Sodium: 147 mmol/L — ABNORMAL HIGH (ref 135–145)

## 2021-08-19 LAB — CBC
HCT: 33.3 % — ABNORMAL LOW (ref 39.0–52.0)
Hemoglobin: 10.9 g/dL — ABNORMAL LOW (ref 13.0–17.0)
MCH: 33.7 pg (ref 26.0–34.0)
MCHC: 32.7 g/dL (ref 30.0–36.0)
MCV: 103.1 fL — ABNORMAL HIGH (ref 80.0–100.0)
Platelets: 405 10*3/uL — ABNORMAL HIGH (ref 150–400)
RBC: 3.23 MIL/uL — ABNORMAL LOW (ref 4.22–5.81)
RDW: 14.8 % (ref 11.5–15.5)
WBC: 22.3 10*3/uL — ABNORMAL HIGH (ref 4.0–10.5)
nRBC: 0 % (ref 0.0–0.2)

## 2021-08-19 MED ORDER — CHLORHEXIDINE GLUCONATE 0.12 % MT SOLN
15.0000 mL | Freq: Two times a day (BID) | OROMUCOSAL | Status: DC
  Administered 2021-08-19 – 2021-08-20 (×3): 15 mL via OROMUCOSAL
  Filled 2021-08-19 (×3): qty 15

## 2021-08-19 MED ORDER — ORAL CARE MOUTH RINSE
15.0000 mL | Freq: Two times a day (BID) | OROMUCOSAL | Status: DC
  Administered 2021-08-19 – 2021-08-20 (×3): 15 mL via OROMUCOSAL

## 2021-08-19 NOTE — Progress Notes (Signed)
Daily Progress Note   Patient Name: Nathan Cook       Date: 08/19/2021 DOB: 1921/09/03  Age: 88 y.o. MRN#: [id number] Attending Physician: Elmarie Shiley, MD Primary Care Physician: Virgie Dad, MD Admit Date: 08/15/2021  Reason for Consultation/Follow-up: Establishing goals of care  Subjective: Medical records reviewed. Patient assessed at the bedside and is more alert today, tells me he is comfortable. He confirms that he has been visiting with family and he looks forward to returning to [facility] whenever able. Discussed the option of comfort care or continuing with conservative management of his acute illness. He feels he is already comfortable while on his treatment plan. He would be agreeable to enrolling in hospice and states "that is up to [place] and my doctor" when asked if he has a preference for when to discharge. He gives this PA permission to follow up with his daughter Nathan Cook.  I then called Nathan Cook to follow up our conversation and provide updates. Counseled on the hospice referral process. Family has decided to honor patient's wishes to continue with conservative management while admitted and enroll in hospice at discharge.    Questions and concerns addressed. PMT will continue to support holistically.  Length of Stay: 4  Current Medications: Scheduled Meds:   apixaban  2.5 mg Oral BID   chlorhexidine  15 mL Mouth Rinse BID   docusate sodium  100 mg Oral BID   feeding supplement  237 mL Oral BID BM   gabapentin  100 mg Oral BID   guaiFENesin  1,200 mg Oral BID   insulin aspart  0-9 Units Subcutaneous TID WC   mouth rinse  15 mL Mouth Rinse q12n4p   metoprolol tartrate  12.5 mg Oral BID   metroNIDAZOLE  500 mg Oral Q12H   simvastatin  10 mg Oral q1800    sodium chloride flush  3 mL Intravenous Q12H    Continuous Infusions:  azithromycin 500 mg (08/19/21 1415)   cefTRIAXone (ROCEPHIN)  IV 1 g (08/19/21 1233)    PRN Meds: acetaminophen **OR** acetaminophen, albuterol, bisacodyl, hydrALAZINE, methocarbamol, ondansetron **OR** ondansetron (ZOFRAN) IV, oxyCODONE, polyethylene glycol, polyvinyl alcohol  Physical Exam Vitals and nursing note reviewed.  Constitutional:      General: He is not in acute distress.    Appearance: He is ill-appearing.  Interventions: Nasal cannula in place.  Cardiovascular:     Rate and Rhythm: Normal rate.  Pulmonary:     Effort: Pulmonary effort is normal. No accessory muscle usage or respiratory distress.  Skin:    General: Skin is warm and dry.  Neurological:     Mental Status: He is alert.            Vital Signs: BP (!) 147/62 (BP Location: Left Arm)    Pulse (!) 104    Temp 97.7 F (36.5 C) (Oral)    Resp 16    Ht 3\' 8"  (1.118 m)    Wt 47 kg    SpO2 95%    BMI 37.63 kg/m  SpO2: SpO2: 95 % O2 Device: O2 Device: Nasal Cannula O2 Flow Rate: O2 Flow Rate (L/min): 5 L/min  Intake/output summary:  Intake/Output Summary (Last 24 hours) at 08/19/2021 1502 Last data filed at 08/19/2021 1200 Gross per 24 hour  Intake 360 ml  Output 225 ml  Net 135 ml    LBM: Last BM Date : 08/15/21 Baseline Weight: Weight: 47 kg Most recent weight: Weight: 47 kg       Palliative Assessment/Data: 20%      Patient Active Problem List   Diagnosis Date Noted   AMS (altered mental status) 08/17/2021   Obesity (BMI 30-39.9) 08/17/2021   Lactic acidosis 08/17/2021   Acute respiratory failure with hypoxia (HCC) 08/15/2021   Pleural effusion associated with pulmonary infection 08/15/2021   CAP (community acquired pneumonia) 08/15/2021   Goals of care, counseling/discussion 08/15/2021   Fatigue 08/12/2021   Microalbuminuria due to type 2 diabetes mellitus ([place]) 09/15/2019   Constipation 05/16/2019   Fall  03/04/2019   S/P AKA (above knee amputation) bilateral ([place]) 02/07/2019   Hyponatremia 01/24/2019   History of CVA (cerebrovascular accident) 01/16/2019   Hypotension 12/14/2018   Weight loss 10/23/2018   Blood loss anemia 10/21/2018   Neuropathic pain 10/21/2018   S/P AKA (above knee amputation) unilateral, left ([place]) 10/15/2018   Critical lower limb ischemia ([place]) 10/09/2018   History of stroke with residual deficit 08/16/2018   Dysphagia 04/12/2018   Paroxysmal atrial fibrillation (HCC)    Stroke ([place]) 04/01/2018   PAD (peripheral artery disease) ([place]) 03/20/2018   Generalized osteoarthritis 06/27/2017   B12 deficiency 06/27/2017   History of TIA (transient ischemic attack) 03/21/2017   Vitamin D deficiency 03/21/2017   Dry eyes, bilateral 03/21/2017   CKD (chronic kidney disease) stage 3, GFR 30-59 ml/min (HCC) 03/21/2017   Cervicalgia 01/04/2016   Pain in joint, shoulder region 05/11/2015   BPH (benign prostatic hyperplasia) 10/14/2014   Hyperlipidemia LDL goal <70    Benign hypertension with chronic kidney disease, stage III ([place])    Controlled type 2 diabetes mellitus with stage 3 chronic kidney disease, without long-term current use of insulin ([place])    Left knee pain 05/27/2014   Brainstem infarct, acute ([place]) s/p IV tPA 11/06/2005   History of colonic polyps 10/14/2003    Palliative Care Assessment & Plan   Patient Profile: 88 y.o. male  with past medical history of BPH, CKD, diabetes, hypertension, A-fib on Eliquis, PVD with history of bilateral AKA, CVA admitted on 08/15/2021 with chest pain and left-sided abdominal pain.   In the ED he was found hypoxic and admitted with acute respiratory failure secondary to pneumonia and left large pleural effusion. He has declined thoracentesis. Palliative care has been consulted to assist with goals of care conversation.  Assessment: Acute respiratory  failure with hypoxia PNA, left, with large pleural effusion Chronic  dysphagia Goals of care conversation  Recommendations/Plan: Continue current care [facility] Appreciate TOC assistance with referral to hospice Spiritual care consulted for catholic priest at family's request Psychosocial and emotional support provided PMT will continue to follow and support. Family is aware I am back on service 2/27. Please call or secure chat with urgent needs   Prognosis:  < 6 months  [facility]charge Planning: [facility]  Total time: I spent 35 minutes in the care of the patient today in the above activities and documenting the encounter.   Dorthy Cooler, PA-C Palliative Medicine Team Team phone # [phone]  Thank you for allowing the Palliative Medicine Team to assist in the care of this patient. Please utilize secure chat with additional questions, if there is no response within 30 minutes please call the above phone number.  Palliative Medicine Team providers are available by phone from 7am to 7pm daily and can be reached through the team cell phone.  Should this patient require assistance outside of these hours, please call the patient's attending physician.

## 2021-08-19 NOTE — Progress Notes (Signed)
Paged provider  O2 desating into the 80's patient alert and talking to staff, provider requested [facility] to come assess and address. Rapid response addressed and O2 at 95.

## 2021-08-19 NOTE — Assessment & Plan Note (Signed)
Encourage oral intake.

## 2021-08-19 NOTE — Progress Notes (Signed)
Physical Therapy Treatment Patient Details Name: Nathan Cook MRN: [id number] DOB: [birth year] Today's Date: 08/19/2021   History of Present Illness Nathan Cook is a 88 y.o. male admitted 08/15/21 with chest pain, felt poorly all last week, hypoxia, weakness, cough. Dx with Acute respiratory failure with hypoxia, PE. PMH includes BPH; CKD: DM; HTN; HLD; afib on Eliquis; PVD with B AKA; and CVA RUE deficits as well as speech pattern dysfunction    PT Comments    Patient expressing increased fatigue/exhaustion. Encouraged patient to attempt transfer to chair this date but patient declined. Patient with soiled linens and patient able to roll L/R with maxA to change bed linens. Educated patient and family on need for rehab to assist with regaining independence,patient and family agreeable. Patient on 6L O2  on arrival with spO2 >90%. Encouraged use of flutter valve and IS with family assistance. Updated recommendation to SNF rehab with patient preference for [facility] which is where he was living prior. Per family, patient is in the SNF section of [facility] along with his wife.     Recommendations for follow up therapy are one component of a multi-disciplinary discharge planning process, led by the attending physician.  Recommendations may be updated based on patient status, additional functional criteria and insurance authorization.  Follow Up Recommendations  Skilled nursing-short term rehab (<3 hours/day) (at [facility] where he was living)     Assistance Recommended at Discharge Frequent or constant Supervision/Assistance  Patient can return home with the following A lot of help with walking and/or transfers;A lot of help with bathing/dressing/bathroom;Assistance with feeding   Equipment Recommendations  None recommended by PT    Recommendations for Other Services       Precautions / Restrictions Precautions Precautions: Fall Precaution Comments: monitor  O2 Restrictions Weight Bearing Restrictions: No     Mobility  Bed Mobility Overal bed mobility: Needs Assistance Bed Mobility: Rolling Rolling: Max assist         General bed mobility comments: maxA to roll L/R for changing bed linens due to soiled linens    Transfers                   General transfer comment: encouraged patient to get to chair this date but expressing fatigue and exhaustion. Will attempt another day    Ambulation/Gait                   Stairs             Wheelchair Mobility    Modified Rankin (Stroke Patients Only)       Balance                                            Cognition Arousal/Alertness: Awake/alert Behavior During Therapy: WFL for tasks assessed/performed Overall Cognitive Status: Impaired/Different from baseline Area of Impairment: Orientation, Problem solving, Following commands, Safety/judgement, Awareness                 Orientation Level: Disoriented to, Situation     Following Commands: Follows one step commands inconsistently, Follows one step commands with increased time Safety/Judgement: Decreased awareness of deficits Awareness: Intellectual Problem Solving: Slow processing, Requires verbal cues          Exercises      General Comments General comments (skin integrity, edema, etc.): son and daughter present  Pertinent Vitals/Pain Pain Assessment Pain Assessment: No/denies pain    Home Living                          Prior Function            PT Goals (current goals can now be found in the care plan section) Acute Rehab PT Goals PT Goal Formulation: With patient Time For Goal Achievement: 08/30/21 Potential to Achieve Goals: Fair Progress towards PT goals: Not progressing toward goals - comment    Frequency    Min 2X/week      PT Plan Discharge plan needs to be updated;Frequency needs to be updated    Co-evaluation               AM-PAC PT "6 Clicks" Mobility   Outcome Measure  Help needed turning from your back to your side while in a flat bed without using bedrails?: A Lot Help needed moving from lying on your back to sitting on the side of a flat bed without using bedrails?: A Lot Help needed moving to and from a bed to a chair (including a wheelchair)?: Total Help needed standing up from a chair using your arms (e.g., wheelchair or bedside chair)?: Total Help needed to walk in hospital room?: Total Help needed climbing 3-5 steps with a railing? : Total 6 Click Score: 8    End of Session Equipment Utilized During Treatment: Oxygen Activity Tolerance: Patient tolerated treatment well Patient left: in bed;with bed alarm set;with call bell/phone within reach;with family/visitor present Nurse Communication: Mobility status PT Visit Diagnosis: Other abnormalities of gait and mobility (R26.89);Other (comment)     Time: 0737-1062 PT Time Calculation (min) (ACUTE ONLY): 27 min  Charges:  $Therapeutic Activity: 23-37 mins                     Nathan Cook PT, DPT Acute Rehabilitation Services Pager [phone] Office [phone]    Nathan Cook 08/19/2021, 3:55 PM

## 2021-08-19 NOTE — Progress Notes (Signed)
Speech Language Pathology Treatment: Dysphagia  Patient Details Name: Nathan Cook MRN: [id number] DOB: 03-Nov-1921 Today's Date: 08/19/2021 Time: 1250-1310 SLP Time Calculation (min) (ACUTE ONLY): 20 min  Assessment / Plan / Recommendation Clinical Impression  Pt seen at bedside to continue education with pt and his daughter. Unfortunately, pt's daughter was not present at this time. Pt was sleeping in bed upon arrival of SLP, and was receptive to request to sit upright and try a little more of his lunch. Pt's speech was very difficult to understand, requiring frequent requests for repetition.   Pt accepted only one bite of pot roast/mashed potatoes. Extended oral prep observed. Pt frequently took sips of water while chewing. Shortly after swallowing oral contents, pt was noted to begin coughing with a very wet congested sounding cough. Pt used Y[place]auer to facilitate clearing, which did not seem effective. Pt gradually stopped coughing, and requested the bed pan. Nursing notified.   Pt is at very high aspiration risk, however, per chart review, he and his daughter both want to continue with regular texture solids and thin liquids. SLP will follow up when daughter is available to finish education.    HPI HPI: Nathan Cook is a 88 yo male who presented to ED from [facility] with 3 days of left sided chest pain in setting of worsening cough; signficant rales and new O2 requirement of 5L [place]. MD concerned for pna.  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 It was initially quite severe, improved over time, but function still waxes and wanes per pt and his daughter, Nathan Cook.  He endorses that he produces a lot of phlegm; has to carry a cup with him throughout the day.  CXR 2/20 shows large (>50%) pleural effusion with opacity, concerning for PNA. PMHx stroke, B/l AKA due to PAD.      SLP Plan  Continue with current plan of care      Recommendations for follow up therapy are  one component of a multi-disciplinary discharge planning process, led by the attending physician.  Recommendations may be updated based on patient status, additional functional criteria and insurance authorization.    Recommendations  Diet recommendations: Regular;Thin liquid Liquids provided via: Cup;Straw Medication Administration: Whole meds with puree Supervision: Staff to assist with self feeding Compensations: Slow rate;Small sips/bites Postural Changes and/or Swallow Maneuvers: Seated upright 90 degrees;Upright 30-60 min after meal                Oral Care Recommendations: Oral care BID Follow Up Recommendations: No SLP follow up Assistance recommended at discharge: Frequent or constant Supervision/Assistance SLP Visit Diagnosis: Dysphagia, oropharyngeal phase (R13.12) Plan: Continue with current plan of care          Leith Szafranski B. Quentin Ore, [facility], [place] Speech Language Pathologist Office: [phone]  Nathan Cook  08/19/2021, 1:30 PM

## 2021-08-19 NOTE — Progress Notes (Addendum)
Occupational Therapy Treatment Patient Details Name: Nathan Cook MRN: [id number] DOB: January 26, 1922 Today's Date: 08/19/2021   History of present illness Nathan Cook is a 88 y.o. male admitted 08/15/21 with chest pain, felt poorly all last week, hypoxia, weakness, cough. Dx with Acute respiratory failure with hypoxia, PE. PMH includes BPH; CKD: DM; HTN; HLD; afib on Eliquis; PVD with B AKA; and CVA RUE deficits as well as speech pattern dysfunction   OT comments  On entry, pt with nasal cannula out with sats at 79% on RA. With reapplication of 6 L O2, returned to 93% within 1 minute. Pt able to pull self to long sitting with Mod A and sustained long sitting > 10 min for pulmonary exercises (flutter valve, IS) but consistent cues needed to initiate tasks and follow one step commands. Pt able to minimally scoot self back towards [facility] and declined assist, wanting to attempt task on his own though quickly fatigued by this. Based on current presentation, updating recs to SNF rehab.   Recommendations for follow up therapy are one component of a multi-disciplinary discharge planning process, led by the attending physician.  Recommendations may be updated based on patient status, additional functional criteria and insurance authorization.    Follow Up Recommendations  Skilled nursing-short term rehab (<3 hours/day)    Assistance Recommended at Discharge Frequent or constant Supervision/Assistance  Patient can return home with the following  A lot of help with walking and/or transfers;A lot of help with bathing/dressing/bathroom   Equipment Recommendations  None recommended by OT    Recommendations for Other Services      Precautions / Restrictions Precautions Precautions: Fall Precaution Comments: monitor O2 Restrictions Weight Bearing Restrictions: No       Mobility Bed Mobility Overal bed mobility: Needs Assistance             General bed mobility comments: able to come to  long sitting with Mod A    Transfers Overall transfer level: Needs assistance                 General transfer comment: increased time to scoot bottom back towards HOB, no LOB but difficult for pt to clear bottom. only able to scoot apprx 1 inch back     Balance                                           ADL either performed or assessed with clinical judgement   ADL Overall ADL's : Needs assistance/impaired     Grooming: Set up;Bed level                                 General ADL Comments: Decreased cognition noted, difficulty expressing needs coherently. decreased endurance    Extremity/Trunk Assessment Upper Extremity Assessment Upper Extremity Assessment: Generalized weakness   Lower Extremity Assessment Lower Extremity Assessment: Defer to PT evaluation        Vision   Vision Assessment?: No apparent visual deficits   Perception     Praxis      Cognition Arousal/Alertness: Awake/alert Behavior During Therapy: WFL for tasks assessed/performed Overall Cognitive Status: Impaired/Different from baseline Area of Impairment: Orientation, Problem solving, Following commands, Safety/judgement, Awareness                 Orientation Level: Disoriented to,  Situation     Following Commands: Follows one step commands inconsistently, Follows one step commands with increased time Safety/Judgement: Decreased awareness of deficits Awareness: Intellectual Problem Solving: Slow processing, Requires verbal cues General Comments: difficult to understand speech at times (baseline). Slower problem solving, difficulty following directions at times with cues for initiation. Perseverating on wanting those present to put their hands under footboard "handle" to pull pt forward despite attempts to redirect        Exercises Exercises: Other exercises Other Exercises Other Exercises: flutter valve x 15 Other Exercises: IS x 5 (<246mL)     Shoulder Instructions       General Comments son and daughter present.    Pertinent Vitals/ Pain       Pain Assessment Pain Assessment: No/denies pain  Home Living                                          Prior Functioning/Environment              Frequency  Min 2X/week        Progress Toward Goals  OT Goals(current goals can now be found in the care plan section)  Progress towards OT goals: OT to reassess next treatment  Acute Rehab OT Goals Patient Stated Goal: family would like for pt to go back to ALF OT Goal Formulation: With patient Time For Goal Achievement: 08/30/21 Potential to Achieve Goals: Good ADL Goals Pt Will Perform Grooming: with set-up;sitting Pt Will Perform Upper Body Dressing: with modified independence;sitting Pt Will Perform Lower Body Dressing: with modified independence;bed level Pt Will Transfer to Toilet: with min guard assist;anterior/posterior transfer Pt Will Perform Toileting - Clothing Manipulation and hygiene: with min guard assist;sitting/lateral leans Additional ADL Goal #1: Pt will perform bed mobility at min A prior to engaging in ADL  Plan Frequency remains appropriate;Discharge plan needs to be updated    Co-evaluation                 AM-PAC OT "6 Clicks" Daily Activity     Outcome Measure   Help from another person eating meals?: A Little Help from another person taking care of personal grooming?: A Little Help from another person toileting, which includes using toliet, bedpan, or urinal?: A Lot Help from another person bathing (including washing, rinsing, drying)?: A Lot Help from another person to put on and taking off regular upper body clothing?: A Little Help from another person to put on and taking off regular lower body clothing?: A Lot 6 Click Score: 15    End of Session Equipment Utilized During Treatment: Oxygen  OT Visit Diagnosis: Muscle weakness (generalized) (M62.81)    Activity Tolerance Patient limited by fatigue   Patient Left in bed;with call bell/phone within reach;with family/visitor present;Other (comment) (with PT)   Nurse Communication Mobility status;Other (comment) (O2)        Time: 1340-1401 OT Time Calculation (min): 21 min  Charges: OT General Charges $OT Visit: 1 Visit OT Treatments $Therapeutic Activity: 8-22 mins  Malachy Chamber, OTR/L Acute Rehab Services Office: [phone]   Layla Maw 08/19/2021, 2:26 PM

## 2021-08-20 DIAGNOSIS — J9601 Acute respiratory failure with hypoxia: Secondary | ICD-10-CM | POA: Diagnosis not present

## 2021-08-20 LAB — CBC
HCT: 38.2 % — ABNORMAL LOW (ref 39.0–52.0)
Hemoglobin: 12.2 g/dL — ABNORMAL LOW (ref 13.0–17.0)
MCH: 33.3 pg (ref 26.0–34.0)
MCHC: 31.9 g/dL (ref 30.0–36.0)
MCV: 104.4 fL — ABNORMAL HIGH (ref 80.0–100.0)
Platelets: 400 10*3/uL (ref 150–400)
RBC: 3.66 MIL/uL — ABNORMAL LOW (ref 4.22–5.81)
RDW: 15 % (ref 11.5–15.5)
WBC: 24.8 10*3/uL — ABNORMAL HIGH (ref 4.0–10.5)
nRBC: 0 % (ref 0.0–0.2)

## 2021-08-20 LAB — GLUCOSE, CAPILLARY
Glucose-Capillary: 154 mg/dL — ABNORMAL HIGH (ref 70–99)
Glucose-Capillary: 144 mg/dL — ABNORMAL HIGH (ref 70–99)

## 2021-08-20 MED ORDER — AMOXICILLIN-POT CLAVULANATE 250-62.5 MG/5ML PO SUSR
250.0000 mg | Freq: Two times a day (BID) | ORAL | 0 refills | Status: AC
Start: 2021-08-20 — End: 2021-09-03

## 2021-08-20 MED ORDER — MORPHINE SULFATE 20 MG/5ML PO SOLN: 2.5000 mg | ORAL | 0 refills | Status: DC | PRN

## 2021-08-20 MED ORDER — GUAIFENESIN-DM 100-10 MG/5ML PO SYRP: 5.0000 mL | ORAL_SOLUTION | ORAL | 0 refills | Status: AC | PRN

## 2021-08-20 MED ORDER — MORPHINE SULFATE 20 MG/5ML PO SOLN: 2.5000 mg | ORAL | 0 refills | Status: AC | PRN

## 2021-08-20 MED ORDER — GUAIFENESIN ER 600 MG PO TB12
1200.0000 mg | ORAL_TABLET | Freq: Two times a day (BID) | ORAL | 0 refills | Status: AC
Start: 2021-08-20 — End: ?

## 2021-08-20 MED ORDER — ALBUTEROL SULFATE (2.5 MG/3ML) 0.083% IN NEBU
2.5000 mg | INHALATION_SOLUTION | RESPIRATORY_TRACT | 12 refills | Status: AC | PRN
Start: 2021-08-20 — End: ?

## 2021-08-20 NOTE — Progress Notes (Addendum)
DISCHARGE NOTE SNF [name] to be discharged [facility] per MD order. Patient verbalized understanding.  Skin clean, dry and intact without evidence of skin break down, no evidence of skin tears noted. IV catheter discontinued intact. Site without signs and symptoms of complications. Dressing and pressure applied. Pt denies pain at the site currently. No complaints noted.  Patient free of lines, drains, and wounds.   Discharge packet assembled. An After Visit Summary (AVS) was printed and given to the EMS personnel. Patient escorted via stretcher and discharged to [facility] via ambulance. Report called to accepting facility; all questions and concerns addressed.   Berneta Levins, RN

## 2021-08-20 NOTE — Progress Notes (Signed)
Chaplain made orientation to pt/family and offered emotional support, briefly engaged pt, who is resting.  Pt does respond, but with effort, and in a whisper.  Pt expresses trust and joy in the process, speaks of looking forward to heaven and feeling blessed about the care he is receiving and the love that surrounds him.  Chaplain has submitted a request for [employer] priest.  Pt is a member of [employer].  Chaplain will advise of any status updates regarding planned visit.    Please notify of any changes that might precipitate a step up in urgency to the priest's visit.    Minus [place], [place] Pager:  [phone]    08/20/21 1200  Clinical Encounter Type  Visited With Patient and family together  Visit Type Initial;Spiritual support  Referral From Patient  Consult/Referral To Chaplain  Spiritual Encounters  Spiritual Needs  (Catholic priest)  Stress Factors  Patient Stress Factors Major life changes  Family Stress Factors Major life changes

## 2021-08-20 NOTE — Discharge Summary (Signed)
Physician Discharge Summary   Patient: Nathan Cook MRN: [id number] DOB: [birth year]  Admit date:     08/15/2021  Discharge date: 08/20/21  Discharge Physician: Elmarie Shiley   PCP: Virgie Dad, MD   Recommendations at discharge:   Home with Hospice care/   Discharge Diagnoses: Principal Problem:   Acute respiratory failure with hypoxia ([place]) Active Problems:   Hyperlipidemia LDL goal <70   Benign hypertension with chronic kidney disease, stage III ([place])   Controlled type 2 diabetes mellitus with stage 3 chronic kidney disease, without long-term current use of insulin (HCC)   CKD (chronic kidney disease) stage 3, GFR 30-59 ml/min (HCC)   Paroxysmal atrial fibrillation (HCC)   Dysphagia   History of CVA (cerebrovascular accident)   S/P AKA (above knee amputation) bilateral (HCC)   Pleural effusion associated with pulmonary infection   CAP (community acquired pneumonia)   Goals of care, counseling/discussion   AMS (altered mental status)   Obesity (BMI 30-39.9)   Lactic acidosis   Hypernatremia  Resolved Problems:   * No resolved hospital problems. *   Hospital Course: 88 year old past medical history significant for BPH, CKD, diabetes, hypertension, A-fib on Eliquis, PVD with history of bilateral AKA, CVA presents complaining of chest pain.  He has been feeling poorly.  He was sent to the ED for further evaluation.  He was found to be hypoxic,, worsening cough he require 5 L of oxygen in the ED to keep oxygen saturation 92%.  White blood cell 26, lactate 2.6, x-ray new moderate to large left pleural effusion with associated left lung airspace opacities, possible atelectasis versus pneumonia.  Patient admitted with acute hypoxic respiratory failure secondary to pneumonia and left large pleural effusion.  Assessment and Plan: * Acute respiratory failure with hypoxia ([place])- (present on admission) - Patient presented with new oxygen requirement, oxygen saturation 86  on room air.  Currently requiring 5 L of oxygen to keep sat 90%. -Likely secondary to left-sided pleural effusion and pneumonia. -He declined thoracentesis. -Continue with IV antibiotics, flutter valve, incentive spirometry. -Palliative care consulted for goals of care. Plan to continue with antibiotics,   at time discharge,  Hospice care at [facility].  Discussed with family, infection wont get better with just antibiotics, plan to discharge home with hospice today. He will be discharge on oral Augmentin.   Hypernatremia Encourage oral intake.   Lactic acidosis Secondary to hypoxemia.   Obesity (BMI 30-39.9) Now with poor oral intake.   AMS (altered mental status) Patient is more sleepy, lethargic. Suspect this is related to infectious process.  CBG normal. Vitals stable.  He is more alert. MS fluctuates.  He is alert and oriented. He wishes to go Home.   Goals of care, counseling/discussion -Declines thoracentesis -DNR form is present at the bedside -plan to discharge home with hospice and antibiotics.   CAP (community acquired pneumonia)- (present on admission) -Patient presented with hypoxia, worsening cough, chest x-ray; new moderate to large left pleural effusion and associated left lung airspace opacity. -Prior history of aspiration. Speech therapy following, planning MBS.  -Influenza negative. -COVID-19 negative. -Schedule Mucinex -Flutter valve incentive spirometry -Declined thoracentesis -Continue with IV ceftriaxone and azithromycin -Palliative  care consulted for goals of care, plan to continue with antibiotics, discharge home with hospice.   Pleural effusion associated with pulmonary infection- (present on admission) -Moderate to large pleural effusion, likely parapneumonic -Decline paracentesis.   S/P AKA (above knee amputation) bilateral (HCC) -B AKAs -Appear to be healing well -  He is usually able to transfer well but is currently too weak -PT/OT consulted.  ALF.   History of CVA (cerebrovascular accident) -Has residual RUE deficits  -On eliquis   Dysphagia -Patient with h/o dysphagia -Evaluated by speech recommend Regular thin liquid diet. Had MBS; showed moderate to severe pharyngeal dysphagia. Speech recommend liquid as primary source of nutrition  and solid for pleasure.    Paroxysmal atrial fibrillation ([place])- (present on admission) -Rate controlled with Lopressor -Continue Eliquis.   CKD (chronic kidney disease) stage 3, GFR 30-59 ml/min (HCC)- (present on admission) -AKI on CKD  stage 3a.  -Cr peak to 1,.4. Prior Ct 1.1.  Improved.   Controlled type 2 diabetes mellitus with stage 3 chronic kidney disease, without long-term current use of insulin ([place])- (present on admission) -Most recent A1c was 6.8 -Continue with SSI.  -Holding metformin     Benign hypertension with chronic kidney disease, stage III ([place])- (present on admission) -Continue Lopressor  -Continue with as needed hydralazine.  Hyperlipidemia LDL goal <70- (present on admission) Discontinue zocor, no significant benefit at this point.          Pain control - [employer] Controlled Substance Reporting System database was reviewed. and patient was instructed, not to drive, operate heavy machinery, perform activities at heights, swimming or participation in water activities or provide baby-sitting services while on Pain, Sleep and Anxiety Medications; until their outpatient Physician has advised to do so again. Also recommended to not to take more than prescribed Pain, Sleep and Anxiety Medications.   Consultants: Palliative care Procedures performed: None  Disposition: Home Diet recommendation:  Discharge Diet Orders (From admission, onward)     Start     Ordered   08/20/21 0000  Diet - low sodium heart healthy        08/20/21 1145           Regular diet  DISCHARGE MEDICATION: Allergies as of 08/20/2021       Reactions   Tape Other (See  Comments)   TAPE WILL TEAR THE SKIN!!!!        Medication List     STOP taking these medications    Calcium Carbonate-Vitamin D 600-400 MG-UNIT tablet   cholecalciferol 25 MCG (1000 UNIT) tablet Commonly known as: VITAMIN D   metFORMIN 500 MG tablet Commonly known as: GLUCOPHAGE   OVER THE COUNTER MEDICATION   OVER THE COUNTER MEDICATION   simvastatin 10 MG tablet Commonly known as: ZOCOR       TAKE these medications    acetaminophen 650 MG CR tablet Commonly known as: TYLENOL Take 650 mg by mouth every 8 (eight) hours as needed for pain. Do not exceed 3,000mg  in 24 hours.   albuterol (2.5 MG/3ML) 0.083% nebulizer solution Commonly known as: PROVENTIL Take 3 mLs (2.5 mg total) by nebulization every 2 (two) hours as needed for wheezing.   amoxicillin-clavulanate 250-62.5 MG/5ML suspension Commonly known as: AUGMENTIN Take 5 mLs (250 mg total) by mouth 2 (two) times daily for 14 days.   apixaban 2.5 MG Tabs tablet Commonly known as: ELIQUIS Take 2.5 mg by mouth 2 (two) times daily. Morning: 7am-11am Evening: 7:30pm-11pm   Baby Shampoo Sham Place 1 application into both eyes See admin instructions. Wash eyes with baby shampoo; resident requests doing it himself and in the am. Uses QD.   bisacodyl 10 MG suppository Commonly known as: DULCOLAX Place 10 mg rectally daily as needed for moderate constipation.   gabapentin 100 MG capsule Commonly known  as: NEURONTIN Take 100 mg by mouth 2 (two) times daily.   guaiFENesin 600 MG 12 hr tablet Commonly known as: MUCINEX Take 2 tablets (1,200 mg total) by mouth 2 (two) times daily.   guaiFENesin-dextromethorphan 100-10 MG/5ML syrup Commonly known as: ROBITUSSIN DM Take 5 mLs by mouth every 4 (four) hours as needed for cough.   methocarbamol 500 MG tablet Commonly known as: ROBAXIN Take 500 mg by mouth every 8 (eight) hours as needed for muscle spasms.   metoprolol tartrate 25 MG tablet Commonly known as:  LOPRESSOR Take 12.5 mg by mouth 2 (two) times daily. Morning: 7am-11am Evening: 7pm-11pm   morphine 20 MG/5ML solution Take 0.6 mLs (2.4 mg total) by mouth every 2 (two) hours as needed for pain (for increase work of breathing).   Propylene Glycol-Glycerin 1-0.3 % Soln Place 2 drops into both eyes daily as needed (dry eyes).   sennosides-docusate sodium 8.6-50 MG tablet Commonly known as: SENOKOT-S Take 2 tablets by mouth at bedtime as needed.   vitamin B-12 500 MCG tablet Commonly known as: CYANOCOBALAMIN Take 500 mcg by mouth daily.   zinc oxide 20 % ointment Apply 1 application topically as needed for irritation. To buttocks after every incontinent episode and as needed for redness. May keep at bedside. As Needed               Durable Medical Equipment  (From admission, onward)           Start     Ordered   08/19/21 1753  For home use only DME oxygen  Once       Question Answer Comment  Length of Need 6 Months   Mode or (Route) Nasal cannula   Liters per Minute 5   Frequency Continuous (stationary and portable oxygen unit needed)   Oxygen delivery system Gas      08/19/21 1752             Discharge Exam: Filed Weights   08/15/21 1053 08/20/21 0448  Weight: 47 kg 47.2 kg   Genera; Chronic ill appearing, alert,  CVS; S 1, S 2 RRR Lung; BL crackles.  BL AKA  Condition at discharge: stable  The results of significant diagnostics from this hospitalization (including imaging, microbiology, ancillary and laboratory) are listed below for reference.   Imaging Studies: DG Chest Port 1 View  Result Date: 08/15/2021 CLINICAL DATA:  Cough. Left-sided chest pain for 3 days worsened today. Shortness of breath. EXAM: PORTABLE CHEST 1 VIEW COMPARISON:  AP chest 04/06/2018 FINDINGS: There is homogeneous opacification of the inferior 50% of the left hemithorax with less dense opacification of the superior 50%. Findings are suggestive of a new moderate-to-large  left pleural effusion tracking up the lateral left pleura with left lung airspace opacities. The right lung is clear. No pneumothorax. The visualized right cardiac contours are unchanged. Calcification is again seen within the aortic arch. Moderate S shaped thoracic curvature is similar to prior. IMPRESSION: New moderate to large left pleural effusion with associated left lung airspace opacities, possible atelectasis versus pneumonia. Electronically Signed   By: Yvonne Kendall M.D.   On: 08/15/2021 11:54   DG Swallowing Func-Speech Pathology  Result Date: 08/17/2021 Table formatting from the original result was not included. Objective Swallowing Evaluation: Type of Study: Bedside Swallow Evaluation  Patient Details Name: RJAY REVOLORIO MRN: [id number] Date of Birth: 12-05-1921 Today's Date: 08/17/2021 Time: SLP Start Time (ACUTE ONLY): 4098 -SLP Stop Time (ACUTE ONLY): 1350 SLP Time Calculation (  min) (ACUTE ONLY): 15 min Past Medical History: Past Medical History: Diagnosis Date  BPH (benign prostatic hyperplasia) 10/14/2014  Cervicalgia 01/04/2016  Chronic kidney disease   Contusion of left shoulder 05/27/14  DM type 2 (diabetes mellitus, type 2) ([place])   Dysphagia   High blood pressure   History of basal cell cancer 11/21/2005  Right temple  History of colonic polyps 10/14/2003  Hyperlipidemia   Left knee pain 05/27/14  Peripheral neuropathic pain   Peripheral vascular disease ([place])   SAH (subarachnoid hemorrhage) ([place]) 2009  TBI  Stroke [facility])   TIA (transient ischemic attack) 11/06/2005  Left eye pain. Slurred speech. Diaphoresis. No residual effects.  Past Surgical History: Past Surgical History: Procedure Laterality Date  ABDOMINAL AORTOGRAM W/LOWER EXTREMITY N/A 06/12/2018  Procedure: ABDOMINAL AORTOGRAM W/LOWER EXTREMITY;  Surgeon: Marty Heck, MD;  Location: [facility];  Service: Cardiovascular;  Laterality: N/A;  Achilles tendon repair Left 1998  ruptured tendon  AMPUTATION Left 10/11/2018   Procedure: Left  AMPUTATION ABOVE KNEE;  Surgeon: Marty Heck, MD;  Location: [facility];  Service: Vascular;  Laterality: Left;  AMPUTATION Right 02/07/2019  Procedure: AMPUTATION ABOVE KNEE;  Surgeon: Rosetta Posner, MD;  Location: [facility];  Service: Vascular;  Laterality: Right;  COLONOSCOPY  1992  Polyp removed  COLONOSCOPY  1998  normal  ORIF FEMUR FRACTURE Right 1929  PERIPHERAL VASCULAR INTERVENTION Left 06/12/2018  Procedure: PERIPHERAL VASCULAR INTERVENTION;  Surgeon: Marty Heck, MD;  Location: [facility];  Service: Cardiovascular;  Laterality: Left;  Attempted   TONSILLECTOMY   HPI: Mikel Hardgrove is a 88 yo male who presented to ED from [facility] with 3 days of left sided chest pain in setting of worsening cough; signficant rales and new O2 requirement of 5L [place]. MD concerned for pna.  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 It was initially quite severe, improved over time, but function still waxes and wanes per pt and his daughter, Donia Guiles.  He endorses that he produces a lot of phlegm; has to carry a cup with him throughout the day.  CXR 2/20 shows large (>50%) pleural effusion with opacity, concerning for PNA. PMHx stroke, B/l AKA due to PAD.  Subjective: pleasant, cooperative  Recommendations for follow up therapy are one component of a multi-disciplinary discharge planning process, led by the attending physician.  Recommendations may be updated based on patient status, additional functional criteria and insurance authorization. Assessment / Plan / Recommendation Clinical Impressions 08/17/2021 Clinical Impression Patient presents with a mildly impaired oral dysphagia and a moderate-severely impaired pharyngeal phase dysphagia. During oral phase, patient with decreased anterior to posterior transit of boluses. During pharyngeal phase, swallow was initiated at level of vallecular sinus for all tested boluses. He exhibited moderate amount of vallecular sinus  residuals and mild amount of pyriform sinus residuals with thin liquids, moderate vallecular residuals with puree solids and trace pyriform sinus residuals. He exhibited flash penetration (PAS 2) with thin liquids and penetration of mild amount of nectar thick liquids without clearance but also without observed aspiration. Moderate amount of barium residuals remained in vallecular sinus during the study and even when patient cued to perform dry swallows, clearance of residuals each additional swallow was minimal. Prominent cricopharyngeal bar was observed and questionable cervical osteophytes (no radiologist present to confirm) with decreased UES opening. SLP is recommending for patient to consider drinking liquids as primary nutrition source and have solids for pleasure. SLP Visit Diagnosis Dysphagia, oropharyngeal  phase (R13.12) Attention and concentration deficit following -- Frontal lobe and executive function deficit following -- Impact on safety and function Moderate aspiration risk;Risk for inadequate nutrition/hydration   Treatment Recommendations 08/15/2021 Treatment Recommendations Therapy as outlined in treatment plan below   Prognosis 08/17/2021 Prognosis for Safe Diet Advancement Guarded Barriers to Reach Goals Severity of deficits;Time post onset Barriers/Prognosis Comment -- Diet Recommendations 08/17/2021 SLP Diet Recommendations Other (Comment);Thin liquid Liquid Administration via -- Medication Administration Other (Comment) Compensations Slow rate;Small sips/bites Postural Changes Seated upright at 90 degrees;Remain semi-upright after after feeds/meals (Comment)   Other Recommendations 08/17/2021 Recommended Consults -- Oral Care Recommendations Oral care QID;Staff/trained caregiver to provide oral care Other Recommendations -- Follow Up Recommendations Other (comment) Assistance recommended at discharge Frequent or constant Supervision/Assistance Functional Status Assessment Patient has had a recent  decline in their functional status and demonstrates the ability to make significant improvements in function in a reasonable and predictable amount of time. Frequency and Duration  08/17/2021 Speech Therapy Frequency (ACUTE ONLY) min 1 x/week Treatment Duration 1 week   Oral Phase 08/17/2021 Oral Phase Impaired Oral - Pudding Teaspoon -- Oral - Pudding Cup -- Oral - Honey Teaspoon -- Oral - Honey Cup -- Oral - Nectar Teaspoon -- Oral - Nectar Cup Reduced posterior propulsion;Weak lingual manipulation;Delayed oral transit Oral - Nectar Straw -- Oral - Thin Teaspoon Delayed oral transit;Weak lingual manipulation;Reduced posterior propulsion Oral - Thin Cup Weak lingual manipulation;Reduced posterior propulsion;Delayed oral transit Oral - Thin Straw Delayed oral transit;Weak lingual manipulation Oral - Puree Delayed oral transit;Reduced posterior propulsion;Weak lingual manipulation Oral - Mech Soft -- Oral - Regular -- Oral - Multi-Consistency -- Oral - Pill -- Oral Phase - Comment --  Pharyngeal Phase 08/17/2021 Pharyngeal Phase Impaired Pharyngeal- Pudding Teaspoon -- Pharyngeal -- Pharyngeal- Pudding Cup -- Pharyngeal -- Pharyngeal- Honey Teaspoon -- Pharyngeal -- Pharyngeal- Honey Cup -- Pharyngeal -- Pharyngeal- Nectar Teaspoon -- Pharyngeal -- Pharyngeal- Nectar Cup Delayed swallow initiation-vallecula;Pharyngeal residue - valleculae;Pharyngeal residue - pyriform Pharyngeal -- Pharyngeal- Nectar Straw -- Pharyngeal -- Pharyngeal- Thin Teaspoon Delayed swallow initiation-vallecula;Pharyngeal residue - valleculae;Pharyngeal residue - pyriform Pharyngeal -- Pharyngeal- Thin Cup Delayed swallow initiation-vallecula;Pharyngeal residue - valleculae;Pharyngeal residue - pyriform Pharyngeal -- Pharyngeal- Thin Straw Delayed swallow initiation-vallecula;Penetration/Aspiration during swallow;Pharyngeal residue - valleculae;Pharyngeal residue - pyriform Pharyngeal Material enters airway, remains ABOVE vocal cords then ejected  out Pharyngeal- Puree Delayed swallow initiation-vallecula;Reduced pharyngeal peristalsis;Pharyngeal residue - valleculae;Pharyngeal residue - posterior pharnyx Pharyngeal -- Pharyngeal- Mechanical Soft -- Pharyngeal -- Pharyngeal- Regular -- Pharyngeal -- Pharyngeal- Multi-consistency -- Pharyngeal -- Pharyngeal- Pill -- Pharyngeal -- Pharyngeal Comment --  Cervical Esophageal Phase  08/17/2021 Cervical Esophageal Phase Impaired Pudding Teaspoon -- Pudding Cup -- Honey Teaspoon -- Honey Cup -- Nectar Teaspoon -- Nectar Cup -- Nectar Straw -- Thin Teaspoon -- Thin Cup -- Thin Straw -- Puree -- Mechanical Soft -- Regular -- Multi-consistency -- Pill -- Cervical Esophageal Comment prominent cricopharyngeal bar and questionable cervical ostephytes (no radiologist to confirm) Sonia Baller, MA, [facility]                      Microbiology: Results for orders placed or performed during the hospital encounter of 08/15/21  Resp Panel by RT-PCR (Flu A&B, Covid) Nasopharyngeal Swab     Status: None   Collection Time: 08/15/21  1:31 PM   Specimen: Nasopharyngeal Swab; Nasopharyngeal(NP) swabs in vial transport medium  Result Value Ref Range Status   SARS Coronavirus 2 by RT PCR NEGATIVE NEGATIVE  Final    Comment: (NOTE) SARS-CoV-2 target nucleic acids are NOT DETECTED.  The SARS-CoV-2 RNA is generally detectable in upper respiratory specimens during the acute phase of infection. The lowest concentration of SARS-CoV-2 viral copies this assay can detect is 138 copies/mL. A negative result does not preclude SARS-Cov-2 infection and should not be used as the sole basis for treatment or other patient management decisions. A negative result may occur with  improper specimen collection/handling, submission of specimen other than nasopharyngeal swab, presence of viral mutation(s) within the areas targeted by this assay, and inadequate number of viral copies(<138 copies/mL). A negative result must be  combined with clinical observations, patient history, and epidemiological information. The expected result is Negative.  Fact Sheet for Patients:  EntrepreneurPulse.com.au  Fact Sheet for Healthcare Providers:  IncredibleEmployment.be  This test is no t yet approved or cleared by the [place] FDA and  has been authorized for detection and/or diagnosis of SARS-CoV-2 by FDA under an Emergency Use Authorization (EUA). This EUA will remain  in effect (meaning this test can be used) for the duration of the COVID-19 declaration under Section 564(b)(1) of the Act, 21 U.S.C.section 360bbb-3(b)(1), unless the authorization is terminated  or revoked sooner.       Influenza A by PCR NEGATIVE NEGATIVE Final   Influenza B by PCR NEGATIVE NEGATIVE Final    Comment: (NOTE) The Xpert Xpress SARS-CoV-2/FLU/RSV plus assay is intended as an aid in the diagnosis of influenza from Nasopharyngeal swab specimens and should not be used as a sole basis for treatment. Nasal washings and aspirates are unacceptable for Xpert Xpress SARS-CoV-2/FLU/RSV testing.  Fact Sheet for Patients: EntrepreneurPulse.com.au  Fact Sheet for Healthcare Providers: IncredibleEmployment.be  This test is not yet approved or cleared by the [place] FDA and has been authorized for detection and/or diagnosis of SARS-CoV-2 by FDA under an Emergency Use Authorization (EUA). This EUA will remain in effect (meaning this test can be used) for the duration of the COVID-19 declaration under Section 564(b)(1) of the Act, 21 U.S.C. section 360bbb-3(b)(1), unless the authorization is terminated or revoked.  Performed at [facility], [place] [address] [place], [place] [address]     Labs: CBC: Recent Labs  Lab 08/15/21 1146 08/16/21 0317 08/17/21 0839 08/18/21 0735 08/19/21 0341 08/20/21 0302  WBC 26.3* 22.8* 22.1* 22.9* 22.3* 24.8*   NEUTROABS 25.2*  --   --   --   --   --   HGB 12.0* 11.5* 12.4* 11.5* 10.9* 12.2*  HCT 37.6* 34.3* 37.9* 34.6* 33.3* 38.2*  MCV 103.0* 100.3* 102.4* 102.1* 103.1* 104.4*  PLT 491* 454* 475* 447* 405* 841   Basic Metabolic Panel: Recent Labs  Lab 08/15/21 1146 08/16/21 0317 08/17/21 0839 08/19/21 0341  NA 139 137 141 147*  K 5.0 4.1 4.4 4.3  CL 100 101 103 112*  CO2 29 27 28 28   GLUCOSE 208* 162* 250* 199*  BUN 54* 54* 59* 55*  CREATININE 1.48* 1.25* 1.34* 1.26*  CALCIUM 9.5 8.7* 9.1 9.0   Liver Function Tests: Recent Labs  Lab 08/15/21 1146  AST 16  ALT 12  ALKPHOS 73  BILITOT 0.7  PROT 6.1*  ALBUMIN 2.0*   CBG: Recent Labs  Lab 08/19/21 1143 08/19/21 1659 08/19/21 2032 08/20/21 0729 08/20/21 1121  GLUCAP 274* 137* 121* 154* 144*    Discharge time spent: greater than 30 minutes.  Signed: Elmarie Shiley, MD [facility] 08/20/2021

## 2021-08-20 NOTE — Progress Notes (Signed)
[place] to give report [place].

## 2021-08-20 NOTE — Progress Notes (Addendum)
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  Plans to dc back to facility today.  Spoke with [place] Donia Guiles) dtr and [facility], provided support, answered questions.  No apparent DME needs that facility cannot supply.  [facility] will f/u with patient and family once back at [facility].  Venia Carbon BSN, RN [facility] Liaison   ** addendum, 1600 approved for hospice services in his facility

## 2021-08-20 NOTE — NC FL2 (Signed)
[place] LEVEL OF CARE SCREENING TOOL     IDENTIFICATION  Patient Name: Nathan Cook Birthdate: July 31, 1921 Sex: male Admission Date (Current Location): 08/15/2021  [facility] and [place] Number:  Herbalist and Address:  [facility], [place] [address] [place], [place] [address]      Provider Number: [id number]  Attending Physician Name and Address:  Elmarie Shiley, MD  Relative Name and Phone Number:  Carver Fila    Current Level of Care: Hospital Recommended Level of Care: [place] Prior Approval Number:    Date Approved/Denied:   PASRR Number: [id number]  Discharge Plan: SNF    Current Diagnoses: Patient Active Problem List   Diagnosis Date Noted   Hypernatremia 08/19/2021   AMS (altered mental status) 08/17/2021   Obesity (BMI 30-39.9) 08/17/2021   Lactic acidosis 08/17/2021   Acute respiratory failure with hypoxia ([place]) 08/15/2021   Pleural effusion associated with pulmonary infection 08/15/2021   CAP (community acquired pneumonia) 08/15/2021   Goals of care, counseling/discussion 08/15/2021   Fatigue 08/12/2021   Microalbuminuria due to type 2 diabetes mellitus ([place]) 09/15/2019   Constipation 05/16/2019   Fall 03/04/2019   S/P AKA (above knee amputation) bilateral ([place]) 02/07/2019   Hyponatremia 01/24/2019   History of CVA (cerebrovascular accident) 01/16/2019   Hypotension 12/14/2018   Weight loss 10/23/2018   Blood loss anemia 10/21/2018   Neuropathic pain 10/21/2018   S/P AKA (above knee amputation) unilateral, left ([place]) 10/15/2018   Critical lower limb ischemia ([place]) 10/09/2018   History of stroke with residual deficit 08/16/2018   Dysphagia 04/12/2018   Paroxysmal atrial fibrillation ([place])    Stroke ([place]) 04/01/2018   PAD (peripheral artery disease) ([place]) 03/20/2018   Generalized osteoarthritis 06/27/2017   B12 deficiency 06/27/2017   History of TIA (transient ischemic  attack) 03/21/2017   Vitamin D deficiency 03/21/2017   Dry eyes, bilateral 03/21/2017   CKD (chronic kidney disease) stage 3, GFR 30-59 ml/min ([place]) 03/21/2017   Cervicalgia 01/04/2016   Pain in joint, shoulder region 05/11/2015   BPH (benign prostatic hyperplasia) 10/14/2014   Hyperlipidemia LDL goal <70    Benign hypertension with chronic kidney disease, stage III ([place])    Controlled type 2 diabetes mellitus with stage 3 chronic kidney disease, without long-term current use of insulin ([place])    Left knee pain 05/27/2014   Brainstem infarct, 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 11/06/2005   History of colonic polyps 10/14/2003    Orientation RESPIRATION BLADDER Height & Weight     Self, Place  O2 Incontinent Weight: 104 lb 0.9 oz (47.2 kg) Height:  3\' 8"  (111.8 cm)  BEHAVIORAL SYMPTOMS/MOOD NEUROLOGICAL BOWEL NUTRITION STATUS      Incontinent Diet (see discharge summary)  AMBULATORY STATUS COMMUNICATION OF NEEDS Skin   Total Care Verbally Normal                       Personal Care Assistance Level of Assistance  Bathing, Dressing Bathing Assistance: Limited assistance   Dressing Assistance: Limited assistance     Functional Limitations Info  Hearing, Sight Sight Info: Impaired Hearing Info: Impaired      SPECIAL CARE FACTORS FREQUENCY                       Contractures      Additional Factors Info  Code Status, Allergies Code Status Info: DNR Allergies Info: Tape  Current Medications (08/20/2021):  This is the current hospital active medication list Current Facility-Administered Medications  Medication Dose Route Frequency Provider Last Rate Last Admin   acetaminophen (TYLENOL) tablet 650 mg  650 mg Oral Q6H PRN Karmen Bongo, MD       Or   acetaminophen (TYLENOL) suppository 650 mg  650 mg Rectal Q6H PRN Karmen Bongo, MD       albuterol (PROVENTIL) (2.5 MG/3ML) 0.083% nebulizer solution 2.5 mg  2.5 mg Nebulization Q2H PRN Karmen Bongo, MD        apixaban Arne [place]) tablet 2.5 mg  2.5 mg Oral BID Karmen Bongo, MD   2.5 mg at 08/20/21 7829   azithromycin (ZITHROMAX) 500 mg in sodium chloride 0.9 % 250 mL IVPB  500 mg Intravenous Q24H Karmen Bongo, MD 250 mL/hr at 08/19/21 1415 500 mg at 08/19/21 1415   bisacodyl (DULCOLAX) suppository 10 mg  10 mg Rectal Daily PRN Karmen Bongo, MD       cefTRIAXone (ROCEPHIN) 1 g in sodium chloride 0.9 % 100 mL IVPB  1 g Intravenous Q24H Karmen Bongo, MD 200 mL/hr at 08/19/21 1233 1 g at 08/19/21 1233   chlorhexidine (PERIDEX) 0.12 % solution 15 mL  15 mL Mouth Rinse BID Regalado, Belkys A, MD   15 mL at 08/20/21 0928   docusate sodium (COLACE) capsule 100 mg  100 mg Oral BID Karmen Bongo, MD   100 mg at 08/20/21 5621   feeding supplement (ENSURE ENLIVE / ENSURE PLUS) liquid 237 mL  237 mL Oral BID BM Regalado, Belkys A, MD   237 mL at 08/20/21 1016   gabapentin (NEURONTIN) capsule 100 mg  100 mg Oral BID Karmen Bongo, MD   100 mg at 08/20/21 3086   guaiFENesin (MUCINEX) 12 hr tablet 1,200 mg  1,200 mg Oral BID Regalado, Belkys A, MD   1,200 mg at 08/20/21 5784   hydrALAZINE (APRESOLINE) injection 5 mg  5 mg Intravenous Q4H PRN Karmen Bongo, MD       insulin aspart (novoLOG) injection 0-9 Units  0-9 Units Subcutaneous TID WC Karmen Bongo, MD   2 Units at 08/20/21 6962   MEDLINE mouth rinse  15 mL Mouth Rinse q12n4p Regalado, Belkys A, MD   15 mL at 08/19/21 1736   methocarbamol (ROBAXIN) tablet 500 mg  500 mg Oral Q8H PRN Karmen Bongo, MD       metoprolol tartrate (LOPRESSOR) tablet 12.5 mg  12.5 mg Oral BID Karmen Bongo, MD   12.5 mg at 08/20/21 0929   metroNIDAZOLE (FLAGYL) tablet 500 mg  500 mg Oral Q12H Regalado, Belkys A, MD   500 mg at 08/20/21 0928   ondansetron (ZOFRAN) tablet 4 mg  4 mg Oral Q6H PRN Karmen Bongo, MD       Or   ondansetron [facility]) injection 4 mg  4 mg Intravenous Q6H PRN Karmen Bongo, MD       oxyCODONE (Oxy IR/ROXICODONE) immediate release  tablet 5 mg  5 mg Oral Q4H PRN Karmen Bongo, MD   5 mg at 08/17/21 2119   polyethylene glycol (MIRALAX / GLYCOLAX) packet 17 g  17 g Oral Daily PRN Karmen Bongo, MD       polyvinyl alcohol (LIQUIFILM TEARS) 1.4 % ophthalmic solution 2 drop  2 drop Both Eyes Daily PRN Karmen Bongo, MD       simvastatin (ZOCOR) tablet 10 mg  10 mg Oral q1800 Karmen Bongo, MD   10 mg at 08/19/21 1732   sodium  chloride flush (NS) 0.9 % injection 3 mL  3 mL Intravenous Q12H Karmen Bongo, MD   3 mL at 08/19/21 2246     Discharge Medications: Please see discharge summary for a list of discharge medications.  Relevant Imaging Results:  Relevant Lab Results:   Additional Information    [place], LCSWA

## 2021-08-20 NOTE — Plan of Care (Signed)
  Problem: Elimination: Goal: Will not experience complications related to urinary retention Outcome: Progressing   Problem: Pain Managment: Goal: General experience of comfort will improve Outcome: Progressing   

## 2021-08-20 NOTE — TOC Transition Note (Addendum)
Transition of Care [facility]) - CM/SW Discharge Note   Patient Details  Name: Nathan Cook MRN: [id number] Date of Birth: 1922-01-09  Transition of Care [facility]) CM/SW Contact:  Ina Homes, [place] Phone Number: 08/20/2021, 1:12 PM   Clinical Narrative:     SW informed pt and family want to d/c today with Hospice Services.   SW spoke with receptionist at [facility] SN[facility], confirmed Lonia Chimera is preferred Hospice provider. Reports sees no problem with pt returning but will make DON aware of hospice services.   SW informed Anderson Malta, RN ([facility]) of pt's request for hospice.   Call report: [phone]  Update 134pm  SW spoke with [facility]) confirmed pt able to return.   SW called [facility] report currently backed up, but will add pt to list   SW spoke with pt's daughter Nathan Cook [phone]) to update regarding delay in transport time.   Update 285pm [employer] [facility] transport approved. Start date 08/20/21 SEL#[id number]   Final next level of care: Skilled Nursing Facility Barriers to Discharge: Barriers Resolved   Patient Goals and CMS Choice        Discharge Placement                Patient to be transferred to facility by: [place] Name of family member notified: Nathan Cook Patient and family notified of of transfer: 08/20/21

## 2021-08-22 ENCOUNTER — Telehealth: Payer: Self-pay | Admitting: *Deleted

## 2021-08-22 NOTE — Telephone Encounter (Signed)
Transition Care Management Unsuccessful Follow-up Telephone Call  Date of discharge and from where:  08/20/2021 [place]  Attempts:  1st Attempt  Reason for unsuccessful TCM follow-up call:  Unable to reach patient [place] [facility]

## 2021-08-24 DEATH — deceased
